# Patient Record
Sex: Female | Born: 1970 | Race: White | Hispanic: No | Marital: Married | Smoking: Never smoker
Health system: Southern US, Community
[De-identification: ages and names within clinical notes are randomized; demographics above are authoritative.]

## PROBLEM LIST (undated history)

## (undated) ENCOUNTER — Emergency Department (HOSPITAL_BASED_OUTPATIENT_CLINIC_OR_DEPARTMENT_OTHER): Admission: EM | Payer: 59 | Source: Home / Self Care

## (undated) DIAGNOSIS — E559 Vitamin D deficiency, unspecified: Secondary | ICD-10-CM

## (undated) DIAGNOSIS — R030 Elevated blood-pressure reading, without diagnosis of hypertension: Secondary | ICD-10-CM

## (undated) DIAGNOSIS — M2550 Pain in unspecified joint: Secondary | ICD-10-CM

## (undated) DIAGNOSIS — E079 Disorder of thyroid, unspecified: Secondary | ICD-10-CM

## (undated) DIAGNOSIS — E039 Hypothyroidism, unspecified: Secondary | ICD-10-CM

## (undated) DIAGNOSIS — M545 Low back pain, unspecified: Secondary | ICD-10-CM

## (undated) DIAGNOSIS — M2560 Stiffness of unspecified joint, not elsewhere classified: Secondary | ICD-10-CM

## (undated) DIAGNOSIS — D869 Sarcoidosis, unspecified: Secondary | ICD-10-CM

## (undated) DIAGNOSIS — J45990 Exercise induced bronchospasm: Secondary | ICD-10-CM

## (undated) DIAGNOSIS — F419 Anxiety disorder, unspecified: Secondary | ICD-10-CM

## (undated) DIAGNOSIS — Z9889 Other specified postprocedural states: Secondary | ICD-10-CM

## (undated) DIAGNOSIS — K829 Disease of gallbladder, unspecified: Secondary | ICD-10-CM

## (undated) DIAGNOSIS — K2270 Barrett's esophagus without dysplasia: Secondary | ICD-10-CM

## (undated) DIAGNOSIS — K429 Umbilical hernia without obstruction or gangrene: Secondary | ICD-10-CM

## (undated) DIAGNOSIS — R0600 Dyspnea, unspecified: Secondary | ICD-10-CM

## (undated) DIAGNOSIS — G43909 Migraine, unspecified, not intractable, without status migrainosus: Secondary | ICD-10-CM

## (undated) DIAGNOSIS — T4145XA Adverse effect of unspecified anesthetic, initial encounter: Secondary | ICD-10-CM

## (undated) DIAGNOSIS — R5383 Other fatigue: Secondary | ICD-10-CM

## (undated) DIAGNOSIS — N979 Female infertility, unspecified: Secondary | ICD-10-CM

## (undated) DIAGNOSIS — R12 Heartburn: Secondary | ICD-10-CM

## (undated) DIAGNOSIS — G4730 Sleep apnea, unspecified: Secondary | ICD-10-CM

## (undated) DIAGNOSIS — R600 Localized edema: Secondary | ICD-10-CM

## (undated) DIAGNOSIS — J45909 Unspecified asthma, uncomplicated: Secondary | ICD-10-CM

## (undated) DIAGNOSIS — I10 Essential (primary) hypertension: Secondary | ICD-10-CM

## (undated) DIAGNOSIS — G8929 Other chronic pain: Secondary | ICD-10-CM

## (undated) DIAGNOSIS — R112 Nausea with vomiting, unspecified: Secondary | ICD-10-CM

## (undated) DIAGNOSIS — C561 Malignant neoplasm of right ovary: Secondary | ICD-10-CM

## (undated) DIAGNOSIS — C562 Malignant neoplasm of left ovary: Principal | ICD-10-CM

## (undated) DIAGNOSIS — F32A Depression, unspecified: Secondary | ICD-10-CM

## (undated) DIAGNOSIS — M1990 Unspecified osteoarthritis, unspecified site: Secondary | ICD-10-CM

## (undated) DIAGNOSIS — K219 Gastro-esophageal reflux disease without esophagitis: Secondary | ICD-10-CM

## (undated) DIAGNOSIS — T451X5A Adverse effect of antineoplastic and immunosuppressive drugs, initial encounter: Secondary | ICD-10-CM

## (undated) DIAGNOSIS — N809 Endometriosis, unspecified: Secondary | ICD-10-CM

## (undated) DIAGNOSIS — G620 Drug-induced polyneuropathy: Secondary | ICD-10-CM

## (undated) DIAGNOSIS — R0602 Shortness of breath: Secondary | ICD-10-CM

## (undated) DIAGNOSIS — Z87442 Personal history of urinary calculi: Secondary | ICD-10-CM

## (undated) DIAGNOSIS — M549 Dorsalgia, unspecified: Secondary | ICD-10-CM

## (undated) HISTORY — DX: Sarcoidosis, unspecified: D86.9

## (undated) HISTORY — PX: PORTA CATH INSERTION: CATH118285

## (undated) HISTORY — PX: LIPOMA EXCISION: SHX5283

## (undated) HISTORY — DX: Elevated blood-pressure reading, without diagnosis of hypertension: R03.0

## (undated) HISTORY — DX: Dorsalgia, unspecified: M54.9

## (undated) HISTORY — DX: Adverse effect of antineoplastic and immunosuppressive drugs, initial encounter: G62.0

## (undated) HISTORY — DX: Vitamin D deficiency, unspecified: E55.9

## (undated) HISTORY — DX: Essential (primary) hypertension: I10

## (undated) HISTORY — DX: Umbilical hernia without obstruction or gangrene: K42.9

## (undated) HISTORY — DX: Localized edema: R60.0

## (undated) HISTORY — DX: Female infertility, unspecified: N97.9

## (undated) HISTORY — PX: GANGLION CYST EXCISION: SHX1691

## (undated) HISTORY — DX: Heartburn: R12

## (undated) HISTORY — DX: Stiffness of unspecified joint, not elsewhere classified: M25.60

## (undated) HISTORY — DX: Exercise induced bronchospasm: J45.990

## (undated) HISTORY — PX: LAPAROSCOPIC ENDOMETRIOSIS FULGURATION: SUR769

## (undated) HISTORY — DX: Shortness of breath: R06.02

## (undated) HISTORY — DX: Pain in unspecified joint: M25.50

## (undated) HISTORY — DX: Other fatigue: R53.83

## (undated) HISTORY — DX: Barrett's esophagus without dysplasia: K22.70

## (undated) HISTORY — DX: Other chronic pain: G89.29

## (undated) HISTORY — PX: MENISCUS REPAIR: SHX5179

## (undated) HISTORY — DX: Low back pain, unspecified: M54.50

## (undated) HISTORY — DX: Malignant neoplasm of right ovary: C56.1

## (undated) HISTORY — DX: Disease of gallbladder, unspecified: K82.9

## (undated) HISTORY — DX: Unspecified asthma, uncomplicated: J45.909

## (undated) HISTORY — DX: Adverse effect of antineoplastic and immunosuppressive drugs, initial encounter: T45.1X5A

## (undated) HISTORY — PX: OTHER SURGICAL HISTORY: SHX169

## (undated) HISTORY — DX: Malignant neoplasm of left ovary: C56.2

---

## 2004-05-21 ENCOUNTER — Ambulatory Visit: Payer: Self-pay | Admitting: Internal Medicine

## 2004-10-15 ENCOUNTER — Ambulatory Visit: Payer: Self-pay | Admitting: Internal Medicine

## 2004-11-19 ENCOUNTER — Ambulatory Visit: Payer: Self-pay | Admitting: Endocrinology

## 2004-12-01 ENCOUNTER — Encounter (INDEPENDENT_AMBULATORY_CARE_PROVIDER_SITE_OTHER): Payer: Self-pay | Admitting: *Deleted

## 2004-12-01 ENCOUNTER — Ambulatory Visit (HOSPITAL_COMMUNITY): Admission: RE | Admit: 2004-12-01 | Discharge: 2004-12-01 | Payer: Self-pay | Admitting: Orthopedic Surgery

## 2004-12-01 ENCOUNTER — Ambulatory Visit (HOSPITAL_BASED_OUTPATIENT_CLINIC_OR_DEPARTMENT_OTHER): Admission: RE | Admit: 2004-12-01 | Discharge: 2004-12-01 | Payer: Self-pay | Admitting: Orthopedic Surgery

## 2004-12-20 ENCOUNTER — Ambulatory Visit (HOSPITAL_COMMUNITY): Admission: RE | Admit: 2004-12-20 | Discharge: 2004-12-20 | Payer: Self-pay | Admitting: Obstetrics and Gynecology

## 2005-01-06 ENCOUNTER — Ambulatory Visit: Payer: Self-pay | Admitting: Internal Medicine

## 2005-01-28 ENCOUNTER — Emergency Department (HOSPITAL_COMMUNITY): Admission: EM | Admit: 2005-01-28 | Discharge: 2005-01-29 | Payer: Self-pay | Admitting: Emergency Medicine

## 2005-02-04 ENCOUNTER — Ambulatory Visit: Payer: Self-pay | Admitting: Internal Medicine

## 2005-06-08 ENCOUNTER — Ambulatory Visit: Payer: Self-pay | Admitting: Internal Medicine

## 2005-06-29 ENCOUNTER — Encounter (INDEPENDENT_AMBULATORY_CARE_PROVIDER_SITE_OTHER): Payer: Self-pay | Admitting: Specialist

## 2005-06-29 ENCOUNTER — Ambulatory Visit (HOSPITAL_COMMUNITY): Admission: RE | Admit: 2005-06-29 | Discharge: 2005-06-29 | Payer: Self-pay | Admitting: Obstetrics and Gynecology

## 2005-09-17 ENCOUNTER — Emergency Department (HOSPITAL_COMMUNITY): Admission: EM | Admit: 2005-09-17 | Discharge: 2005-09-17 | Payer: Self-pay | Admitting: Family Medicine

## 2005-12-03 ENCOUNTER — Inpatient Hospital Stay (HOSPITAL_COMMUNITY): Admission: AD | Admit: 2005-12-03 | Discharge: 2005-12-03 | Payer: Self-pay | Admitting: *Deleted

## 2006-02-09 ENCOUNTER — Ambulatory Visit: Payer: Self-pay | Admitting: Endocrinology

## 2006-03-08 ENCOUNTER — Ambulatory Visit: Payer: Self-pay | Admitting: Internal Medicine

## 2006-08-08 ENCOUNTER — Encounter: Admission: RE | Admit: 2006-08-08 | Discharge: 2006-11-06 | Payer: Self-pay | Admitting: Internal Medicine

## 2007-01-29 ENCOUNTER — Encounter: Payer: Self-pay | Admitting: *Deleted

## 2007-01-29 DIAGNOSIS — R12 Heartburn: Secondary | ICD-10-CM | POA: Insufficient documentation

## 2007-01-29 DIAGNOSIS — G43909 Migraine, unspecified, not intractable, without status migrainosus: Secondary | ICD-10-CM | POA: Insufficient documentation

## 2007-04-13 ENCOUNTER — Ambulatory Visit: Payer: Self-pay | Admitting: Internal Medicine

## 2007-04-13 DIAGNOSIS — N809 Endometriosis, unspecified: Secondary | ICD-10-CM | POA: Insufficient documentation

## 2007-04-13 DIAGNOSIS — F411 Generalized anxiety disorder: Secondary | ICD-10-CM | POA: Insufficient documentation

## 2007-04-13 DIAGNOSIS — J309 Allergic rhinitis, unspecified: Secondary | ICD-10-CM | POA: Insufficient documentation

## 2007-04-13 DIAGNOSIS — H6690 Otitis media, unspecified, unspecified ear: Secondary | ICD-10-CM | POA: Insufficient documentation

## 2007-04-13 DIAGNOSIS — K219 Gastro-esophageal reflux disease without esophagitis: Secondary | ICD-10-CM | POA: Insufficient documentation

## 2007-04-17 ENCOUNTER — Encounter (INDEPENDENT_AMBULATORY_CARE_PROVIDER_SITE_OTHER): Payer: Self-pay | Admitting: Internal Medicine

## 2007-05-17 ENCOUNTER — Telehealth (INDEPENDENT_AMBULATORY_CARE_PROVIDER_SITE_OTHER): Payer: Self-pay | Admitting: *Deleted

## 2007-07-16 ENCOUNTER — Ambulatory Visit: Payer: Self-pay | Admitting: Internal Medicine

## 2007-08-13 ENCOUNTER — Ambulatory Visit: Payer: Self-pay | Admitting: Internal Medicine

## 2007-08-13 DIAGNOSIS — S139XXA Sprain of joints and ligaments of unspecified parts of neck, initial encounter: Secondary | ICD-10-CM | POA: Insufficient documentation

## 2007-09-03 ENCOUNTER — Encounter (INDEPENDENT_AMBULATORY_CARE_PROVIDER_SITE_OTHER): Payer: Self-pay | Admitting: Internal Medicine

## 2007-09-06 ENCOUNTER — Encounter (INDEPENDENT_AMBULATORY_CARE_PROVIDER_SITE_OTHER): Payer: Self-pay | Admitting: Internal Medicine

## 2007-09-24 ENCOUNTER — Telehealth (INDEPENDENT_AMBULATORY_CARE_PROVIDER_SITE_OTHER): Payer: Self-pay | Admitting: *Deleted

## 2007-11-29 ENCOUNTER — Ambulatory Visit: Payer: Self-pay | Admitting: Internal Medicine

## 2007-11-29 ENCOUNTER — Telehealth (INDEPENDENT_AMBULATORY_CARE_PROVIDER_SITE_OTHER): Payer: Self-pay | Admitting: *Deleted

## 2007-11-29 DIAGNOSIS — F329 Major depressive disorder, single episode, unspecified: Secondary | ICD-10-CM

## 2007-11-29 DIAGNOSIS — G4700 Insomnia, unspecified: Secondary | ICD-10-CM | POA: Insufficient documentation

## 2007-11-29 DIAGNOSIS — F32A Depression, unspecified: Secondary | ICD-10-CM | POA: Insufficient documentation

## 2007-11-29 DIAGNOSIS — R21 Rash and other nonspecific skin eruption: Secondary | ICD-10-CM | POA: Insufficient documentation

## 2007-12-27 ENCOUNTER — Ambulatory Visit: Payer: Self-pay | Admitting: Internal Medicine

## 2008-01-03 ENCOUNTER — Encounter: Payer: Self-pay | Admitting: Internal Medicine

## 2008-01-05 ENCOUNTER — Encounter: Payer: Self-pay | Admitting: Internal Medicine

## 2008-11-02 ENCOUNTER — Inpatient Hospital Stay (HOSPITAL_COMMUNITY): Admission: AD | Admit: 2008-11-02 | Discharge: 2008-11-04 | Payer: Self-pay | Admitting: Obstetrics & Gynecology

## 2008-11-02 ENCOUNTER — Encounter (INDEPENDENT_AMBULATORY_CARE_PROVIDER_SITE_OTHER): Payer: Self-pay | Admitting: Obstetrics & Gynecology

## 2008-11-02 ENCOUNTER — Encounter: Payer: Self-pay | Admitting: Emergency Medicine

## 2008-11-11 ENCOUNTER — Ambulatory Visit: Payer: Self-pay | Admitting: Internal Medicine

## 2008-11-11 ENCOUNTER — Encounter: Payer: Self-pay | Admitting: Internal Medicine

## 2008-11-11 DIAGNOSIS — J9801 Acute bronchospasm: Secondary | ICD-10-CM | POA: Insufficient documentation

## 2008-12-17 ENCOUNTER — Ambulatory Visit: Payer: Self-pay | Admitting: Internal Medicine

## 2008-12-17 DIAGNOSIS — M549 Dorsalgia, unspecified: Secondary | ICD-10-CM | POA: Insufficient documentation

## 2008-12-17 DIAGNOSIS — L600 Ingrowing nail: Secondary | ICD-10-CM | POA: Insufficient documentation

## 2008-12-17 DIAGNOSIS — R1011 Right upper quadrant pain: Secondary | ICD-10-CM | POA: Insufficient documentation

## 2008-12-24 ENCOUNTER — Telehealth: Payer: Self-pay | Admitting: Internal Medicine

## 2009-01-14 ENCOUNTER — Telehealth: Payer: Self-pay | Admitting: Internal Medicine

## 2009-01-14 ENCOUNTER — Ambulatory Visit (HOSPITAL_COMMUNITY): Admission: RE | Admit: 2009-01-14 | Discharge: 2009-01-14 | Payer: Self-pay | Admitting: Gynecology

## 2009-02-11 ENCOUNTER — Ambulatory Visit: Payer: Self-pay | Admitting: Internal Medicine

## 2009-02-11 ENCOUNTER — Encounter: Payer: Self-pay | Admitting: Family Medicine

## 2009-02-11 DIAGNOSIS — R609 Edema, unspecified: Secondary | ICD-10-CM | POA: Insufficient documentation

## 2009-03-17 ENCOUNTER — Ambulatory Visit: Payer: Self-pay | Admitting: Internal Medicine

## 2009-04-06 ENCOUNTER — Telehealth: Payer: Self-pay | Admitting: Internal Medicine

## 2009-04-06 ENCOUNTER — Emergency Department (HOSPITAL_COMMUNITY): Admission: EM | Admit: 2009-04-06 | Discharge: 2009-04-06 | Payer: Self-pay | Admitting: Emergency Medicine

## 2009-06-15 ENCOUNTER — Ambulatory Visit: Payer: Self-pay | Admitting: Internal Medicine

## 2009-07-01 ENCOUNTER — Ambulatory Visit: Payer: Self-pay | Admitting: Internal Medicine

## 2009-07-01 ENCOUNTER — Encounter (INDEPENDENT_AMBULATORY_CARE_PROVIDER_SITE_OTHER): Payer: Self-pay | Admitting: *Deleted

## 2009-07-01 DIAGNOSIS — J329 Chronic sinusitis, unspecified: Secondary | ICD-10-CM | POA: Insufficient documentation

## 2009-08-26 ENCOUNTER — Encounter (INDEPENDENT_AMBULATORY_CARE_PROVIDER_SITE_OTHER): Payer: Self-pay | Admitting: *Deleted

## 2009-08-26 ENCOUNTER — Ambulatory Visit: Payer: Self-pay | Admitting: Internal Medicine

## 2009-08-26 DIAGNOSIS — J019 Acute sinusitis, unspecified: Secondary | ICD-10-CM | POA: Insufficient documentation

## 2009-12-18 ENCOUNTER — Ambulatory Visit: Payer: Self-pay | Admitting: Internal Medicine

## 2009-12-18 DIAGNOSIS — M25569 Pain in unspecified knee: Secondary | ICD-10-CM | POA: Insufficient documentation

## 2009-12-18 DIAGNOSIS — H9209 Otalgia, unspecified ear: Secondary | ICD-10-CM | POA: Insufficient documentation

## 2009-12-21 ENCOUNTER — Telehealth: Payer: Self-pay | Admitting: Internal Medicine

## 2009-12-22 ENCOUNTER — Encounter (INDEPENDENT_AMBULATORY_CARE_PROVIDER_SITE_OTHER): Payer: Self-pay | Admitting: *Deleted

## 2009-12-29 ENCOUNTER — Encounter: Payer: Self-pay | Admitting: Internal Medicine

## 2009-12-29 ENCOUNTER — Ambulatory Visit (HOSPITAL_COMMUNITY): Admission: RE | Admit: 2009-12-29 | Discharge: 2009-12-29 | Payer: Self-pay | Admitting: Internal Medicine

## 2010-02-24 ENCOUNTER — Ambulatory Visit: Payer: Self-pay | Admitting: Internal Medicine

## 2010-03-10 ENCOUNTER — Telehealth (INDEPENDENT_AMBULATORY_CARE_PROVIDER_SITE_OTHER): Payer: Self-pay | Admitting: *Deleted

## 2010-03-16 ENCOUNTER — Encounter: Payer: Self-pay | Admitting: Internal Medicine

## 2010-06-10 NOTE — Letter (Signed)
Summary: Out of Work  [facility]  [address] [place], [place] [address]   Phone: [phone]  Fax: [phone]    February 24, 2010   Employee:  [name]    To Whom It May Concern:   For Medical reasons, please excuse the above named employee from work for the following dates:  Start:   for the 3 days missed between Feb 08, 2010 and Feb 24, 2010, as well as from Feb 24, 2010  End:   Mar 09, 2010 -   to return to work without restriction Mar 10, 2010  If you need additional information, please feel free to contact our office.         Sincerely,    Corwin Levins MD

## 2010-06-10 NOTE — Assessment & Plan Note (Signed)
Summary: MUSCLE IN BACK ARE TIGHT/ BACK PAIN/ DR Raphael Gibney PT/NWS  #   Vital Signs:  Patient profile:   40 year old female Height:      68 inches (172.72 cm) Weight:      261.0 pounds (118.64 kg) O2 Sat:      97 % on Room air Temp:     98.8 degrees F (37.11 degrees C) oral Pulse rate:   98 / minute BP sitting:   128 / 72  (left arm) Cuff size:   large  Vitals Entered By: Orlan Leavens (June 15, 2009 3:42 PM)  O2 Flow:  Room air CC: Ongoing (L) back pain. pt states it feel like it in a knott all the time Is Patient Diabetic? No Pain Assessment Patient in pain? yes     Location: (L) back Type: aching   Primary Care Provider:  Corwin Levins MD  CC:  Ongoing (L) back pain. pt states it feel like it in a knott all the time.  History of Present Illness: here today with complaint of  lower right siode back pain. onset of symptoms was >6 months ago. course has been gradual onset and now occurs in constantly present but waxing/waning pattern of intensity. problem precipitated by nothing in particular - no injury or trauma. symptom characterized as "knot" feeling to the right side of low back. symptom radiates to nowhere - not into left side nor buttock or legs. problem associated with discomfort but not associated with trouble walking, incont, rash or falls. symptoms improved by heating pad - also trying stretches per PT friend but not helping to resolve issue. symptoms worsened with activity at work - lifting, twisting. + prior hx of same symptoms - similar to myofacial pain in left shoulder blade area (but this only occassionaly present).   Current Medications (verified): 1)  Ambien Cr 12.5 Mg  Cr-Tabs (Zolpidem Tartrate) .Marland Kitchen.. 1 By Mouth At Bedtime Prn 2)  Proair Hfa 108 (90 Base) Mcg/act Aers (Albuterol Sulfate) .... 2 Puffs Qid As Needed 3)  Hydrochlorothiazide 25 Mg Tabs (Hydrochlorothiazide) .Marland Kitchen.. 1po Once Daily 4)  Sumatriptan Succinate 100 Mg Tabs (Sumatriptan Succinate)  .Marland Kitchen.. 1 By Mouth Every Other Day As Needed 5)  Apri 0.15-30 Mg-Mcg Tabs (Desogestrel-Ethinyl Estradiol) .... Take One Tab By Mouth Once Daily 6)  Alprazolam 0.5 Mg Tabs (Alprazolam) .... 1/2 - 1 By Mouth Two Times A Day As Needed  Allergies (verified): 1)  ! Sulfa 2)  ! * Avelox 3)  Doxycycline  Past History:  Past Medical History: Reviewed history from 11/29/2007 and no changes required. Allergic rhinitis Anxiety GERD arthritis migraines, hx endometriosis/?PCOS - infertile Depression  Review of Systems  The patient denies fever, chest pain, headaches, muscle weakness, and difficulty walking.    Physical Exam  General:  overweight-appearing.  alert, well-developed, well-nourished, and cooperative to examination.    Lungs:  normal respiratory effort, no intercostal retractions or use of accessory muscles; normal breath sounds bilaterally - no crackles and no wheezes.    Heart:  normal rate, regular rhythm, no murmur, and no rub. BLE with chronic pitting edema. Msk:  back: full range of motion of lumbar spine. Nontender to palpation over vertebra but +right paraspinal spasm.. Negative straight leg raise. Deep tendon reflexes symmetrically intact at Achilles and patella, negative clonus. Sensation intact throughout all dermatomes in bilateral lower extremities. Full strength to manual muscle testing in all major muscule groups. Able to heel and toe walk without difficulty and ambulates  with a normal gait.  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 1po three times a day as needed    Tramadol Hcl 50 Mg Tabs (Tramadol hcl) .Marland Kitchen... 1po q 6 hrs as needed pain Her updated medication list for this problem includes:    Robaxin-750 750 Mg Tabs (Methocarbamol) .Marland Kitchen... 1 by mouth every 8 hours as needed for muscle spasms  Orders: T-Lumbar Spine 2 Views (72100TC) Physical Therapy Referral (PT)  Complete Medication List: 1)  Ambien Cr 12.5 Mg Cr-tabs (Zolpidem tartrate) .Marland Kitchen.. 1 by mouth at bedtime prn 2)  Proair Hfa 108 (90 Base) Mcg/act Aers (Albuterol sulfate) .... 2 puffs qid as needed 3)  Hydrochlorothiazide 25 Mg Tabs (Hydrochlorothiazide) .Marland Kitchen.. 1po once daily 4)  Sumatriptan Succinate 100 Mg Tabs (Sumatriptan succinate) .Marland Kitchen.. 1 by mouth every other day as needed 5)  Apri 0.15-30 Mg-mcg Tabs (Desogestrel-ethinyl estradiol) .... Take one tab by mouth once daily 6)  Alprazolam 0.5 Mg Tabs (Alprazolam) .... 1/2 - 1 by mouth two times a day as needed 7)  Prednisone (pak) 10 Mg Tabs (Prednisone) .... As directed x 6 days 8)  Robaxin-750 750 Mg Tabs (Methocarbamol) .Marland Kitchen.. 1 by mouth every 8 hours as needed for muscle spasms  Patient Instructions: 1)  it was good to see you today.  2)  test(s) ordered today - your results will be posted on the phone tree for review in 48-72 hours from the time of test completion; call [phone] and enter your 9 digit MRN (listed above on this page, just below your name); if any changes need to be made or there are abnormal results, you will be contacted directly.  3)  treat the pain with pred pak and muscle relaxants (robaxin) - your prescriptions have been electronically submitted to your pharmacy. Please take as directed. Contact our office if you believe you're having problems with the medication(s).  4)  we'll make referral to physical therapy. Our office will contact you  regarding this appointment once made.  5)  it is important that you work on losing weight - monitor your diet and consume fewer calories such as less carbohydrates (sugar) and less fat. you also need to increase your physical activity level - start by walking for 10-20 minutes 3 times per week and work up to 30 minutes 4-5 times each week.  Prescriptions: ROBAXIN-750 750 MG TABS (METHOCARBAMOL) 1 by mouth every 8 hours as needed for muscle spasms  #40 x 1   Entered and Authorized by:   Newt Lukes MD   Signed by:   Newt Lukes MD on 06/15/2009   Method used:   Electronically to        [facility] [facility] (retail)       [address]       [place], [place]  [address]       Ph: [phone]       Fax: [phone]   RxID:   [id number] PREDNISONE (PAK) 10 MG TABS (PREDNISONE) as directed x 6 days  #1 x 0   Entered and Authorized by:   Newt Lukes MD   Signed by:   Newt Lukes MD on 06/15/2009   Method used:   Electronically to        [facility] [address] (retail)       [address]       [place], [place]  [address]       Ph: [phone]       Fax: [phone]   RxID:   [id number]

## 2010-06-10 NOTE — Progress Notes (Signed)
Summary: PA Cymbalta  Phone Note Call from Patient Call back at Home Phone [phone]   Caller: Patient Summary of Call: Pt called to inform office that PA is needed for Cymbalta at [facility] [phone] Initial call taken by: Margaret Pyle, CMA,  March 10, 2010 8:56 AM  Follow-up for Phone Call        Called [facility] @ [phone], awaiting form. Dagoberto Reef  March 12, 2010 3:36 PM Received form, gave to Dr Jonny Ruiz. Dagoberto Reef  March 12, 2010 4:20 PM  [place] faxed to [phone], awaiting approval. Dagoberto Reef  March 16, 2010 4:14 PM  Cymbalta approved 03/16/10-03/16/2011, pt aware. Follow-up by: Dagoberto Reef,  March 16, 2010 4:50 PM

## 2010-06-10 NOTE — Assessment & Plan Note (Signed)
Summary: DR Sammuel Cooper PT/NO SLOT-COUGH-CHEST CONGEST--STC   Vital Signs:  Patient profile:   40 year old female Height:      68 inches (172.72 cm) Weight:      261 pounds (118.64 kg) O2 Sat:      97 % on Room air Temp:     98.5 degrees F (36.94 degrees C) oral Pulse rate:   94 / minute BP sitting:   124 / 88  (left arm) Cuff size:   large  Vitals Entered By: Orlan Leavens (July 01, 2009 2:04 PM)  O2 Flow:  Room air CC: cough/ chest congestion. also pt req refill on xanax, URI symptoms Is Patient Diabetic? No Pain Assessment Patient in pain? no        Primary Care Provider:  Corwin Levins MD  CC:  cough/ chest congestion. also pt req refill on xanax and URI symptoms.  History of Present Illness:  URI Symptoms      This is a 40 year old woman who presents with URI symptoms.  The symptoms began 2 days ago.  The severity is described as mild.  The patient reports nasal congestion, green discharge from nose, dry cough, and sick contacts, but denies sore throat and productive cough.  Associated symptoms include dyspnea.  The patient denies fever.  The patient also reports headache and severe fatigue.    Current Medications (verified): 1)  Proair Hfa 108 (90 Base) Mcg/act Aers (Albuterol Sulfate) .... 2 Puffs Qid As Needed 2)  Hydrochlorothiazide 25 Mg Tabs (Hydrochlorothiazide) .Marland Kitchen.. 1po Once Daily 3)  Sumatriptan Succinate 100 Mg Tabs (Sumatriptan Succinate) .Marland Kitchen.. 1 By Mouth Every Other Day As Needed 4)  Apri 0.15-30 Mg-Mcg Tabs (Desogestrel-Ethinyl Estradiol) .... Take One Tab By Mouth Once Daily 5)  Alprazolam 0.5 Mg Tabs (Alprazolam) .... 1/2 - 1 By Mouth Two Times A Day As Needed 6)  Robaxin-750 750 Mg Tabs (Methocarbamol) .Marland Kitchen.. 1 By Mouth Every 8 Hours As Needed For Muscle Spasms  Allergies (verified): 1)  ! Sulfa 2)  ! * Avelox 3)  Doxycycline  Past History:  Past Medical History: Reviewed history from 11/29/2007 and no changes required. Allergic  rhinitis Anxiety GERD arthritis migraines, hx endometriosis/?PCOS - infertile Depression  Review of Systems  The patient denies syncope, hemoptysis, and abdominal pain.    Physical Exam  General:  overweight-appearing.  alert, well-developed, well-nourished, and cooperative to examination. mildly ill   Eyes:  vision grossly intact; pupils equal, round and reactive to light.  conjunctiva and lids normal.    Ears:  normal pinnae bilaterally, without erythema, swelling, or tenderness to palpation. TMs clear - R with mild erythema and fluid/air bubble - L without effusion, or cerumen impaction. Hearing grossly normal bilaterally  Mouth:  teeth and gums in good repair; mucous membranes moist, without lesions or ulcers. oropharynx clear without exudate, mild erythema. +PND - white and thick Lungs:  normal respiratory effort, no intercostal retractions or use of accessory muscles; normal breath sounds bilaterally - no crackles and no wheezes.    Heart:  normal rate, regular rhythm, no murmur, and no rub. BLE with chronic pitting edema.   Impression & Recommendations:  Problem # 1:  SINUSITIS (ICD-473.9)  Her updated medication list for this problem includes:    Amoxicillin-pot Clavulanate 875-125 Mg Tabs (Amoxicillin-pot clavulanate) .Marland Kitchen... 1 by mouth two times a day x 7days    Promethazine Vc/codeine 6.25-5-10 Mg/20ml Syrp (Phenyleph-promethazine-cod) .Marland KitchenMarland KitchenMarland KitchenMarland Kitchen 5 cc by mouth every 4 hours as needed for cough -  especially night symptoms  Take antibiotics for full duration. Discussed treatment options including indications for coronal CT scan of sinuses and ENT referral.   Problem # 2:  BACK PAIN (ICD-724.5) improved while taking pred pak earlier this month - robaxin ineffective - try flexeril -  ?need other back specialist eval -  encouraged to get through current illness and then followup with dr. Carvel Getting re: same if persists Her updated medication list for this problem includes:     Cyclobenzaprine Hcl 5 Mg Tabs (Cyclobenzaprine hcl) .Marland Kitchen... 1-2 by mouth every 8 hours orn for muscle spasms  Problem # 3:  ANXIETY (ICD-300.00) very limited supply on meds provided until can get in with PCP Her updated medication list for this problem includes:    Alprazolam 0.5 Mg Tabs (Alprazolam) .Marland Kitchen... 1/2 - 1 by mouth two times a day as needed  Complete Medication List: 1)  Proair Hfa 108 (90 Base) Mcg/act Aers (Albuterol sulfate) .... 2 puffs qid as needed 2)  Hydrochlorothiazide 25 Mg Tabs (Hydrochlorothiazide) .Marland Kitchen.. 1po once daily 3)  Sumatriptan Succinate 100 Mg Tabs (Sumatriptan succinate) .Marland Kitchen.. 1 by mouth every other day as needed 4)  Apri 0.15-30 Mg-mcg Tabs (Desogestrel-ethinyl estradiol) .... Take one tab by mouth once daily 5)  Alprazolam 0.5 Mg Tabs (Alprazolam) .... 1/2 - 1 by mouth two times a day as needed 6)  Cyclobenzaprine Hcl 5 Mg Tabs (Cyclobenzaprine hcl) .Marland Kitchen.. 1-2 by mouth every 8 hours orn for muscle spasms 7)  Amoxicillin-pot Clavulanate 875-125 Mg Tabs (Amoxicillin-pot clavulanate) .Marland Kitchen.. 1 by mouth two times a day x 7days 8)  Promethazine Vc/codeine 6.25-5-10 Mg/16ml Syrp (Phenyleph-promethazine-cod) .... 5 cc by mouth every 4 hours as needed for cough - especially night symptoms  Patient Instructions: 1)  it was good to see you today. 2)  antibiotics -Augmentin - and codiene cough syrup for your sinus symptoms  -  Please take as directed. Contact our office if you believe you're having problems with the medication(s).  3)  change muscle relaxant from robaxin to generic flexeril - use as directed 4)  limited supply refill on xanax until you see dr. Jonny Ruiz 5)  Get plenty of rest, drink lots of clear liquids, and use Tylenol or Ibuprofen for fever and comfort. Return in 7-10 days if you're not better:sooner if you're feeling worse. 6)  Please schedule a follow-up appointment with Dr. Jonny Ruiz in next 2-3 months, sooner if problems.  Prescriptions: ALPRAZOLAM 0.5 MG TABS  (ALPRAZOLAM) 1/2 - 1 by mouth two times a day as needed  #20 x 0   Entered and Authorized by:   Newt Lukes MD   Signed by:   Newt Lukes MD on 07/01/2009   Method used:   Print then Give to Patient   RxID:   [id number] CYCLOBENZAPRINE HCL 5 MG TABS (CYCLOBENZAPRINE HCL) 1-2 by mouth every 8 hours orn for muscle spasms  #30 x 0   Entered and Authorized by:   Newt Lukes MD   Signed by:   Newt Lukes MD on 07/01/2009   Method used:   Print then Give to Patient   RxID:   [id number] PROMETHAZINE VC/CODEINE 6.25-5-10 MG/5ML SYRP (PHENYLEPH-PROMETHAZINE-COD) 5 cc by mouth every 4 hours as needed for cough - especially night symptoms  #120cc x 0   Entered and Authorized by:   Newt Lukes MD   Signed by:   Newt Lukes MD on 07/01/2009   Method used:   Print then Give  to Patient   RxID:   [id number] AMOXICILLIN-POT CLAVULANATE 875-125 MG TABS (AMOXICILLIN-POT CLAVULANATE) 1 by mouth two times a day x 7days  #14 x 0   Entered and Authorized by:   Newt Lukes MD   Signed by:   Newt Lukes MD on 07/01/2009   Method used:   Print then Give to Patient   RxID:   [id number]

## 2010-06-10 NOTE — Letter (Signed)
Summary: Out of Work  [facility]  [address] [place], [place] [address]   Phone: [phone]  Fax: [phone]    August 26, 2009   Employee:  [name]    To Whom It May Concern:   For Medical reasons, please excuse the above named employee from work for the following dates:  Start:   08/26/2009  End:   08/26/2009  If you need additional information, please feel free to contact our office.         Sincerely,    Dr. Oliver Barre

## 2010-06-10 NOTE — Consult Note (Signed)
Summary: [facility]  [facility]   Imported By: Sherian Rein 01/04/2010 09:43:09  _____________________________________________________________________  External Attachment:    Type:   Image     Comment:   External Document

## 2010-06-10 NOTE — Medication Information (Signed)
Summary: Cymbalta / [facility]  Cymbalta / [facility]   Imported By: Lennie Odor 03/18/2010 11:27:03  _____________________________________________________________________  External Attachment:    Type:   Image     Comment:   External Document

## 2010-06-10 NOTE — Letter (Signed)
Summary: Out of Work  [facility]  [address] [place], [place] [address]   Phone: [phone]  Fax: [phone]    July 01, 2009   Employee:  Kacie HARVEY    To Whom It May Concern:   For Medical reasons, please excuse the above named employee from work for the following dates:  Start: 07/01/09    End: 07/02/09, May return to work on Friday 07/03/09    If you need additional information, please feel free to contact our office.         Sincerely,    Dr. Rene Paci

## 2010-06-10 NOTE — Progress Notes (Signed)
Summary: WORK NOTE  Phone Note Call from Patient Call back at Home Phone [phone]   Caller: PATIENT - WALK IN SHEET Summary of Call: [name] HAS THE LETTER EXCUSING HER FROM [employer].  [employer] WON'T ACCEPT A "LIGHT DUTY" NOTE.  SHE WANTS ONE STATING SHE CAN EITHER WORK OR STAY OUT.  SHE IS STILL WAITING ON AN APPT FOR THE MRI. NUMBER TO [phone] Initial call taken by: Hilarie Fredrickson,  December 21, 2009 1:26 PM  Follow-up for Phone Call        need dates for MRI and ortho from [facility] please  Follow-up by: Corwin Levins MD,  December 21, 2009 3:18 PM  Additional Follow-up for Phone Call Additional follow up Details #1::        Dr Jonny Ruiz pt is scheduled for her  for MRI  [facility] Aug 18.2011@5 :00 pm- as for orthopedic she owe a bill no one will see her until her balance is cleared up with [facility] her ENT APPT IS 12-24-2009@1 :30pm dr Clovis Cao  left pt a msg to call reguarding her referrals Additional Follow-up by: Shelbie Proctor,  December 21, 2009 3:54 PM    Additional Follow-up for Phone Call Additional follow up Details #2::    ok for work note per robin from last day pt worked , to (for now) aug 22  please ask pt if she is willing to see ortho at [facility] [place] Follow-up by: Corwin Levins MD,  December 21, 2009 4:56 PM  Additional Follow-up for Phone Call Additional follow up Details #3:: Details for Additional Follow-up Action Taken: called pt left msg. to call back  PT CALLED.  SHE WORKED 1/2 DAY SAT. AUG 13 AND FULL DAY SUN. AUG 14.  SHE HAS BEEN OUT THIS WEEK.   SHE WILL GO TO [facility] FOR AN APPT./NWS  12-24-09  Called pt to inform to pickup letter at front desk. Additional Follow-up by: Robin Ewing CMA Duncan Dull),  December 22, 2009 7:57 AM   referral done to ortho - [facility]  Corwin Levins MD  December 22, 2009 12:23 PM

## 2010-06-10 NOTE — Assessment & Plan Note (Signed)
Summary: anxiety/cd   Vital Signs:  Patient profile:   40 year old female Height:      68 inches Weight:      255.25 pounds BMI:     38.95 O2 Sat:      98 % on Room air Temp:     99.3 degrees F oral Pulse rate:   87 / minute BP sitting:   110 / 76  (left arm) Cuff size:   large  Vitals Entered By: Zella Ball Ewing CMA Duncan Dull) (February 24, 2010 3:47 PM)  O2 Flow:  Room air CC: Anxiety and depression/RE   Primary Care Aricela Bertagnolli:  Corwin Levins MD  CC:  Anxiety and depression/RE.  History of Present Illness: here with acute  c/o marked stressors and anxiety - has had numberous significant stressors - 4 dogs got out and died, 2 firends coincidently died recnetly (suicide both),  husband just lost his job, and electricity cut off and cant afford to re-start until next wk; denies suicidal ideation;  did see urgent care last night with sinus congesiton  - tx with steroid shot that ? makes her more irritable here;  requests antidepressant  and xanax - did well with cymbalta previously ; cant stop crying; husband is good support; mother died 09-21-2007, and aunt died last yr (mother was my pt as well);  just cant seem to recover from one shock to the next;  she is still working, but had to call in sick today - jsut couldnt face it. Missed 3 days last wk as well.  Had the last 2 days, jut couldnt go in today. No illicit drug use, or ETOH  .  Migraines have been stable overall without worsening freq,severity in the past few wks - only 1-2 overall.  Also requests flexeril for recurrnet right lower back spasms likewise without change, bowel or bladder, gait problem or fall, fever or wt loss  Problems Prior to Update: 1)  Ear Pain, Bilateral  (ICD-388.70) 2)  Knee Pain, Right  (ICD-719.46) 3)  Edema  (ICD-782.3) 4)  Sinusitis- Acute-nos  (ICD-461.9) 5)  Sinusitis  (ICD-473.9) 6)  Peripheral Edema  (ICD-782.3) 7)  Back Pain  (ICD-724.5) 8)  Ingrown Toenail  (ICD-703.0) 9)  Ruq Pain  (ICD-789.01) 10)  Acute  Bronchospasm  (ICD-519.11) 11)  Obesity, Morbid  (ICD-278.01) 12)  Otitis Media, Acute, Right  (ICD-382.9) 13)  Depression  (ICD-311) 14)  Insomnia-sleep Disorder-unspec  (ICD-780.52) 15)  Rash-nonvesicular  (ICD-782.1) 16)  Cervical Strain  (ICD-847.0) 17)  Otitis Media, Acute  (ICD-382.9) 18)  Endometriosis  (ICD-617.9) 19)  Gerd  (ICD-530.81) 20)  Anxiety  (ICD-300.00) 21)  Allergic Rhinitis  (ICD-477.9) 22)  Migraine Headache  (ICD-346.90) 23)  Heartburn  (ICD-787.1)  Medications Prior to Update: 1)  Proair Hfa 108 (90 Base) Mcg/act Aers (Albuterol Sulfate) .... 2 Puffs Qid As Needed 2)  Furosemide 40 Mg Tabs (Furosemide) .Marland Kitchen.. 1po Once Daily 3)  Axert 12.5 Mg Tabs (Almotriptan Malate) .Marland Kitchen.. 1 By Mouth Every Other Day As Needed As Needed 4)  Apri 0.15-30 Mg-Mcg Tabs (Desogestrel-Ethinyl Estradiol) .... Take One Tab By Mouth Once Daily 5)  Alprazolam 1 Mg Tabs (Alprazolam) .Marland Kitchen.. 1 By Mouth Two Times A Day As Needed 6)  Fluticasone Propionate 50 Mcg/act Susp (Fluticasone Propionate) .... 2 Spray/side Once Daily 7)  Meclizine Hcl 12.5 Mg Tabs (Meclizine Hcl) .Marland Kitchen.. 1 By Mouth Q 6 Hrs Prn Dizziness 8)  Flexeril 5 Mg Tabs (Cyclobenzaprine Hcl) .Marland Kitchen.. 1 By Mouth Three Times  A Day As Needed 9)  Percocet 5-325 Mg Tabs (Oxycodone-Acetaminophen) .... By Mouth Q 6 Hrs As Needed 10)  Prednisone 10 Mg Tabs (Prednisone) .... Asd  Current Medications (verified): 1)  Proair Hfa 108 (90 Base) Mcg/act Aers (Albuterol Sulfate) .... 2 Puffs Qid As Needed 2)  Furosemide 40 Mg Tabs (Furosemide) .Marland Kitchen.. 1po Once Daily 3)  Axert 12.5 Mg Tabs (Almotriptan Malate) .Marland Kitchen.. 1 By Mouth Every Other Day As Needed As Needed 4)  Apri 0.15-30 Mg-Mcg Tabs (Desogestrel-Ethinyl Estradiol) .... Take One Tab By Mouth Once Daily 5)  Alprazolam 1 Mg Tabs (Alprazolam) .Marland Kitchen.. 1 By Mouth Three Times A Day As Needed 6)  Fluticasone Propionate 50 Mcg/act Susp (Fluticasone Propionate) .... 2 Spray/side Once Daily 7)  Meclizine Hcl 12.5 Mg  Tabs (Meclizine Hcl) .Marland Kitchen.. 1 By Mouth Q 6 Hrs Prn Dizziness 8)  Flexeril 5 Mg Tabs (Cyclobenzaprine Hcl) .Marland Kitchen.. 1 By Mouth Three Times A Day As Needed 9)  Percocet 5-325 Mg Tabs (Oxycodone-Acetaminophen) .... By Mouth Q 6 Hrs As Needed 10)  Prednisone 10 Mg Tabs (Prednisone) .... Asd 11)  Cymbalta 60 Mg Cpep (Duloxetine Hcl) .Marland Kitchen.. 1po Once Daily  Allergies (verified): 1)  ! Sulfa 2)  ! * Avelox 3)  Doxycycline 4)  * Lexapro  Past History:  Past Medical History: Last updated: 11/29/2007 Allergic rhinitis Anxiety GERD arthritis migraines, hx endometriosis/?PCOS - infertile Depression  Past Surgical History: Last updated: 04/13/2007 ear tubes in childhood ganglion cyst in right wrist endometriosis  Social History: Last updated: 11/29/2007 Married lives with spouse Current Smoker-recently started Alcohol use-yes-0-2, less than monthly Drug use-no no children work - [employer] administrator  Risk Factors: Smoking Status: current (04/13/2007)  Review of Systems       all otherwise negative per pt -    Physical Exam  General:  alert and overweight-appearing.   Head:  normocephalic and atraumatic.   Eyes:  vision grossly intact, pupils equal, and pupils round.   Ears:  L ear normal.  with right TM moderate erythema and bulging Nose:  no external deformity and no nasal discharge.   Mouth:  pharyngeal erythema and fair dentition.   Neck:  supple and no masses.   Lungs:  normal respiratory effort and normal breath sounds.   Heart:  normal rate and regular rhythm.   Extremities:  no edema, no erythema  Neurologic:  strength normal in all extremities and gait normal.   Psych:  not agitated, not suicidal, depressed affect, tearful, and severely anxious.     Impression & Recommendations:  Problem # 1:  DEPRESSION (ICD-311)  Her updated medication list for this problem includes:    Alprazolam 1 Mg Tabs (Alprazolam) .Marland Kitchen... 1 by mouth three times a day as needed    Cymbalta  60 Mg Cpep (Duloxetine hcl) .Marland Kitchen... 1po once daily ok to re-start the cymbalta 30 for 1 wk, then 60 after, refer psychiatry - major depression  Orders: Psychiatric Referral (Psych)  Problem # 2:  ANXIETY (ICD-300.00)  Her updated medication list for this problem includes:    Alprazolam 1 Mg Tabs (Alprazolam) .Marland Kitchen... 1 by mouth three times a day as needed    Cymbalta 60 Mg Cpep (Duloxetine hcl) .Marland Kitchen... 1po once daily to incr to three times a day for now  Problem # 3:  ALLERGIC RHINITIS (ICD-477.9)  Her updated medication list for this problem includes:    Fluticasone Propionate 50 Mcg/act Susp (Fluticasone propionate) .Marland Kitchen... 2 spray/side once daily improved s/p steroid shot yesterday -  Continue all previous medications as before this visit   Problem # 4:  MIGRAINE HEADACHE (ICD-346.90)  Her updated medication list for this problem includes:    Axert 12.5 Mg Tabs (Almotriptan malate) .Marland Kitchen... 1 by mouth every other day as needed as needed    Percocet 5-325 Mg Tabs (Oxycodone-acetaminophen) ..... By mouth q 6 hrs as needed stable overall by hx and exam, ok to continue meds/tx as is   Complete Medication List: 1)  Proair Hfa 108 (90 Base) Mcg/act Aers (Albuterol sulfate) .... 2 puffs qid as needed 2)  Furosemide 40 Mg Tabs (Furosemide) .Marland Kitchen.. 1po once daily 3)  Axert 12.5 Mg Tabs (Almotriptan malate) .Marland Kitchen.. 1 by mouth every other day as needed as needed 4)  Apri 0.15-30 Mg-mcg Tabs (Desogestrel-ethinyl estradiol) .... Take one tab by mouth once daily 5)  Alprazolam 1 Mg Tabs (Alprazolam) .Marland Kitchen.. 1 by mouth three times a day as needed 6)  Fluticasone Propionate 50 Mcg/act Susp (Fluticasone propionate) .... 2 spray/side once daily 7)  Meclizine Hcl 12.5 Mg Tabs (Meclizine hcl) .Marland Kitchen.. 1 by mouth q 6 hrs prn dizziness 8)  Flexeril 5 Mg Tabs (Cyclobenzaprine hcl) .Marland Kitchen.. 1 by mouth three times a day as needed 9)  Percocet 5-325 Mg Tabs (Oxycodone-acetaminophen) .... By mouth q 6 hrs as needed 10)  Prednisone 10  Mg Tabs (Prednisone) .... Asd 11)  Cymbalta 60 Mg Cpep (Duloxetine hcl) .Marland Kitchen.. 1po once daily  Patient Instructions: 1)  start the cymbalta 30 mg per day for one wk, then 60 mg per day after that 2)  increase the alprazolam up to 3 times per day as needed  3)  Continue all previous medications as before this visit  4)  You will be contacted about the referral(s) to: psychiatry 5)  Please schedule a follow-up appointment as needed. Prescriptions: CYMBALTA 60 MG CPEP (DULOXETINE HCL) 1po once daily  #90 x 3   Entered and Authorized by:   Corwin Levins MD   Signed by:   Corwin Levins MD on 02/24/2010   Method used:   Print then Give to Patient   RxID:   [id number] ALPRAZOLAM 1 MG TABS (ALPRAZOLAM) 1 by mouth three times a day as needed  #90 x 1   Entered and Authorized by:   Corwin Levins MD   Signed by:   Corwin Levins MD on 02/24/2010   Method used:   Print then Give to Patient   RxID:   [id number]    Orders Added: 1)  Psychiatric Referral [Psych] 2)  Est. Patient Level IV [29518]

## 2010-06-10 NOTE — Assessment & Plan Note (Signed)
Summary: STIFF KNEE PAIN---STC   Vital Signs:  Patient profile:   40 year old female Height:      67 inches Weight:      260.50 pounds BMI:     40.95 O2 Sat:      97 % on Room air Temp:     98.1 degrees F oral Pulse rate:   101 / minute BP sitting:   120 / 84  (left arm) Cuff size:   large  Vitals Entered By: Zella Ball Ewing CMA Duncan Dull) (December 18, 2009 1:44 PM)  O2 Flow:  Room air CC: Right Knee pain, low back spasms/RE   Primary Care Provider:  Corwin Levins MD  CC:  Right Knee pain and low back spasms/RE.  History of Present Illness: here to f/u;  saw urgent care on [place] for right knee pain, incidently had right lower back pain and concluded per pt she had sciatica, given limited rx percocet and prednisone.  Here today for second opinion, still with primary problem of right knee pain, moderate overall with severe pain flares, now assoc with incresaed swelling , most pain to area just below the patella anteriorly, feels somewhat unstable as if she might fall and with click to ambulate (no locking);  no falls but she is favoring the RLE , forcing her to walk differently and also with right lower back pain and tenderness that has occurred before;  back pain somewhat radiates to the right buttock, but not the leg and leg without other radicular pain, weak or numbness.  No bowel or bladder change.  No fever, wt loss, night sweats, loss of appetite or other constitutional symptoms  Also incidently with acute on chronic right earache with low grade tamp x 3 days without headache, ST , chills and Pt denies CP, sob, doe, wheezing, orthopnea, pnd, worsening LE edema, palps, dizziness or syncope  Does mention the imitrex causes choking anterior neck sensation and reqwuests change of med, no increased freq or severity and Pt denies new neuro symptoms such as  facial or extremity weakness   Problems Prior to Update: 1)  Ear Pain, Bilateral  (ICD-388.70) 2)  Knee Pain, Right  (ICD-719.46) 3)  Edema   (ICD-782.3) 4)  Sinusitis- Acute-nos  (ICD-461.9) 5)  Sinusitis  (ICD-473.9) 6)  Peripheral Edema  (ICD-782.3) 7)  Back Pain  (ICD-724.5) 8)  Ingrown Toenail  (ICD-703.0) 9)  Ruq Pain  (ICD-789.01) 10)  Acute Bronchospasm  (ICD-519.11) 11)  Obesity, Morbid  (ICD-278.01) 12)  Otitis Media, Acute, Right  (ICD-382.9) 13)  Depression  (ICD-311) 14)  Insomnia-sleep Disorder-unspec  (ICD-780.52) 15)  Rash-nonvesicular  (ICD-782.1) 16)  Cervical Strain  (ICD-847.0) 17)  Otitis Media, Acute  (ICD-382.9) 18)  Endometriosis  (ICD-617.9) 19)  Gerd  (ICD-530.81) 20)  Anxiety  (ICD-300.00) 21)  Allergic Rhinitis  (ICD-477.9) 22)  Migraine Headache  (ICD-346.90) 23)  Heartburn  (ICD-787.1)  Medications Prior to Update: 1)  Proair Hfa 108 (90 Base) Mcg/act Aers (Albuterol Sulfate) .... 2 Puffs Qid As Needed 2)  Furosemide 40 Mg Tabs (Furosemide) .Marland Kitchen.. 1po Once Daily 3)  Sumatriptan Succinate 100 Mg Tabs (Sumatriptan Succinate) .Marland Kitchen.. 1 By Mouth Every Other Day As Needed 4)  Apri 0.15-30 Mg-Mcg Tabs (Desogestrel-Ethinyl Estradiol) .... Take One Tab By Mouth Once Daily 5)  Alprazolam 1 Mg Tabs (Alprazolam) .Marland Kitchen.. 1 By Mouth Two Times A Day As Needed 6)  Cyclobenzaprine Hcl 5 Mg Tabs (Cyclobenzaprine Hcl) .Marland Kitchen.. 1-2 By Mouth Every 8 Hours Orn For Muscle  Spasms 7)  Promethazine Vc/codeine 6.25-5-10 Mg/67ml Syrp (Phenyleph-Promethazine-Cod) .... 5 Cc By Mouth Every 4 Hours As Needed For Cough - Especially Night Symptoms 8)  Azithromycin 250 Mg Tabs (Azithromycin) .... 2po Qd For 1 Day, Then 1po Qd For 4days, Then Stop 9)  Fluticasone Propionate 50 Mcg/act Susp (Fluticasone Propionate) .... 2 Spray/side Once Daily 10)  Meclizine Hcl 12.5 Mg Tabs (Meclizine Hcl) .Marland Kitchen.. 1 By Mouth Q 6 Hrs Prn Dizziness  Current Medications (verified): 1)  Proair Hfa 108 (90 Base) Mcg/act Aers (Albuterol Sulfate) .... 2 Puffs Qid As Needed 2)  Furosemide 40 Mg Tabs (Furosemide) .Marland Kitchen.. 1po Once Daily 3)  Axert 12.5 Mg Tabs  (Almotriptan Malate) .Marland Kitchen.. 1 By Mouth Every Other Day As Needed As Needed 4)  Apri 0.15-30 Mg-Mcg Tabs (Desogestrel-Ethinyl Estradiol) .... Take One Tab By Mouth Once Daily 5)  Alprazolam 1 Mg Tabs (Alprazolam) .Marland Kitchen.. 1 By Mouth Two Times A Day As Needed 6)  Fluticasone Propionate 50 Mcg/act Susp (Fluticasone Propionate) .... 2 Spray/side Once Daily 7)  Meclizine Hcl 12.5 Mg Tabs (Meclizine Hcl) .Marland Kitchen.. 1 By Mouth Q 6 Hrs Prn Dizziness 8)  Flexeril 5 Mg Tabs (Cyclobenzaprine Hcl) .Marland Kitchen.. 1 By Mouth Three Times A Day As Needed 9)  Percocet 5-325 Mg Tabs (Oxycodone-Acetaminophen) .... By Mouth Q 6 Hrs As Needed 10)  Prednisone 10 Mg Tabs (Prednisone) .... Asd  Allergies (verified): 1)  ! Sulfa 2)  ! * Avelox 3)  Doxycycline  Past History:  Past Medical History: Last updated: 11/29/2007 Allergic rhinitis Anxiety GERD arthritis migraines, hx endometriosis/?PCOS - infertile Depression  Past Surgical History: Last updated: 04/13/2007 ear tubes in childhood ganglion cyst in right wrist endometriosis  Social History: Last updated: 11/29/2007 Married lives with spouse Current Smoker-recently started Alcohol use-yes-0-2, less than monthly Drug use-no no children work - [employer] administrator  Risk Factors: Smoking Status: current (04/13/2007)  Review of Systems       all otherwise negative per pt -    Physical Exam  General:  alert and overweight-appearing.   Head:  normocephalic and atraumatic.   Eyes:  vision grossly intact, pupils equal, and pupils round.   Ears:  L ear normal.  with right TM moderate erythema and bulging Nose:  no external deformity and no nasal discharge.   Mouth:  no gingival abnormalities and pharynx pink and moist.   Neck:  supple and no masses.   Lungs:  normal respiratory effort and normal breath sounds.   Heart:  normal rate and regular rhythm.   Abdomen:  soft, non-tender, and normal bowel sounds.   Msk:  tender right lumbar paravertebral tender  with spasm only,  no spine tender; right knee with 1-2+ effusion, decreased ROM to full flexion with pain and marked pain on ROM but no tenderness to palpation Extremities:  no edema, no erythema  Neurologic:  cranial nerves II-XII intact and strength normal in all extremities.  , gait antalgic favoring the RLE   Impression & Recommendations:  Problem # 1:  KNEE PAIN, RIGHT (ICD-719.46)  The following medications were removed from the medication list:    Cyclobenzaprine Hcl 5 Mg Tabs (Cyclobenzaprine hcl) .Marland Kitchen... 1-2 by mouth every 8 hours orn for muscle spasms Her updated medication list for this problem includes:    Flexeril 5 Mg Tabs (Cyclobenzaprine hcl) .Marland Kitchen... 1 by mouth three times a day as needed    Percocet 5-325 Mg Tabs (Oxycodone-acetaminophen) ..... By mouth q 6 hrs as needed prob cartilage  tear,  for MRI right knee, refer Dr Irving Copas, also note for work - on stairs, ladders, squatting or heavy lifitng  Orders: Radiology Referral (Radiology) Orthopedic Surgeon Referral (Ortho Surgeon)  Problem # 2:  BACK PAIN (ICD-724.5)  The following medications were removed from the medication list:    Cyclobenzaprine Hcl 5 Mg Tabs (Cyclobenzaprine hcl) .Marland Kitchen... 1-2 by mouth every 8 hours orn for muscle spasms Her updated medication list for this problem includes:    Flexeril 5 Mg Tabs (Cyclobenzaprine hcl) .Marland Kitchen... 1 by mouth three times a day as needed    Percocet 5-325 Mg Tabs (Oxycodone-acetaminophen) ..... By mouth q 6 hrs as needed c/w MSK spasm most liekly; treat as above, f/u any worsening signs or symptoms   Problem # 3:  MIGRAINE HEADACHE (ICD-346.90)  Her updated medication list for this problem includes:    Axert 12.5 Mg Tabs (Almotriptan malate) .Marland Kitchen... 1 by mouth every other day as needed as needed    Percocet 5-325 Mg Tabs (Oxycodone-acetaminophen) ..... By mouth q 6 hrs as needed imitrex causes choking sensation , like maxalt previously - to try axert as needed   Problem #  4:  EAR PAIN, BILATERAL (ICD-388.70)  The following medications were removed from the medication list:    Azithromycin 250 Mg Tabs (Azithromycin) .Marland Kitchen... 2po qd for 1 day, then 1po qd for 4days, then stop  Orders: ENT Referral (ENT) treat as above, f/u any worsening signs or symptoms   Complete Medication List: 1)  Proair Hfa 108 (90 Base) Mcg/act Aers (Albuterol sulfate) .... 2 puffs qid as needed 2)  Furosemide 40 Mg Tabs (Furosemide) .Marland Kitchen.. 1po once daily 3)  Axert 12.5 Mg Tabs (Almotriptan malate) .Marland Kitchen.. 1 by mouth every other day as needed as needed 4)  Apri 0.15-30 Mg-mcg Tabs (Desogestrel-ethinyl estradiol) .... Take one tab by mouth once daily 5)  Alprazolam 1 Mg Tabs (Alprazolam) .Marland Kitchen.. 1 by mouth two times a day as needed 6)  Fluticasone Propionate 50 Mcg/act Susp (Fluticasone propionate) .... 2 spray/side once daily 7)  Meclizine Hcl 12.5 Mg Tabs (Meclizine hcl) .Marland Kitchen.. 1 by mouth q 6 hrs prn dizziness 8)  Flexeril 5 Mg Tabs (Cyclobenzaprine hcl) .Marland Kitchen.. 1 by mouth three times a day as needed 9)  Percocet 5-325 Mg Tabs (Oxycodone-acetaminophen) .... By mouth q 6 hrs as needed 10)  Prednisone 10 Mg Tabs (Prednisone) .... Asd  Patient Instructions: 1)  OK to take the medications as per urgent care (the prednisone and percocet) 2)  Please take all new medications as prescribed  - the flexeril for the right lower back spasm 3)  You will be contacted about the referral(s) to: MRI for the right knee, and Dr Ranell Patrick, and ENT 4)  You are given the work note as well 5)  stop the sumatriptan 6)  take the axert instead as needed  7)  Please schedule a follow-up appointment as needed. Prescriptions: AXERT 12.5 MG TABS (ALMOTRIPTAN MALATE) 1 by mouth every other day as needed as needed  #10 x 11   Entered and Authorized by:   Corwin Levins MD   Signed by:   Corwin Levins MD on 12/18/2009   Method used:   Print then Give to Patient   RxID:   [id number] FLEXERIL 5 MG TABS (CYCLOBENZAPRINE HCL) 1  by mouth three times a day as needed  #60 x 0   Entered and Authorized by:   Corwin Levins MD   Signed by:   Len Blalock  Braun Rocca MD on 12/18/2009   Method used:   Print then Give to Patient   RxID:   [id number]

## 2010-06-10 NOTE — Letter (Signed)
Summary: Generic Letter  [facility]  [address] [place], [place] [address]   Phone: [phone]  Fax: [phone]    12/18/2009  [facility] [address] [place], [place]  [address]  Dear Ms. HARVEY,       This note is to state until further notice (after   the MRI and orthopedic consultation), you should be limited  at work to No Stair climbing, No Ladder climbing, no  squatting, and No heavy lifting more than 25 lbs.       Sincerely,   Oliver Barre MD

## 2010-06-10 NOTE — Letter (Signed)
Summary: Out of Work  [facility]  [address] [place], [place] [address]   Phone: [phone]  Fax: [phone]    December 22, 2009   Employee:  [name]    To Whom It May Concern:   For Medical reasons, please excuse the above named employee from work for the following dates:  Start:   12/21/2009  End:   12/28/2009  If you need additional information, please feel free to contact our office.         Sincerely,    Dr. Oliver Barre

## 2010-06-10 NOTE — Assessment & Plan Note (Signed)
Summary: R EAR PAIN--SINUS PROBLEM  STC   Vital Signs:  Patient profile:   40 year old female Height:      68 inches Weight:      259.25 pounds BMI:     39.56 O2 Sat:      98 % on Room air Temp:     98 degrees F oral Pulse rate:   101 / minute BP sitting:   118 / 74  (left arm) Cuff size:   large  Vitals Entered ByMarland Kitchen Zella Ball Ewing (August 26, 2009 3:49 PM)  O2 Flow:  Room air CC: Right ear pain, dizziness, sinus congestion/RE   Primary Care Provider:  Corwin Levins MD  CC:  Right ear pain, dizziness, and sinus congestion/RE.  History of Present Illness: here with acute onset mild to mod x 3 days facial pain, pressure, fever and greenish d/c, with slight ST and mild positional veritgo, on top of uncontrolled sinus congestion for several wks with clearish d/c and sneezing;  also with marked increased stressors recent and anxiety mostly work related  - feels severe tension just walking into work every day due to overwork responsibilites as others have been laid off;  no depressive symptoms or suicidal ideation, or panic, even though a favorite aunt died recently.  Has had mild increased pedal and LE edema recent despite good med complaince with the HCTZ and trying to aovid salt and incresed fluids such as for wt loss;  does stand all day at work ([employer]) .  Pt denies CP, sob, doe, wheezing, orthopnea, pnd, palps, dizziness or syncope .  Claritin D not helping the ear.  Did see Dr Lajoyce Corners , podiatry recently for right plantar fascitis, with some improvement after steroid pills and relafen.    Problems Prior to Update: 1)  Edema  (ICD-782.3) 2)  Sinusitis- Acute-nos  (ICD-461.9) 3)  Sinusitis  (ICD-473.9) 4)  Peripheral Edema  (ICD-782.3) 5)  Back Pain  (ICD-724.5) 6)  Ingrown Toenail  (ICD-703.0) 7)  Ruq Pain  (ICD-789.01) 8)  Acute Bronchospasm  (ICD-519.11) 9)  Obesity, Morbid  (ICD-278.01) 10)  Otitis Media, Acute, Right  (ICD-382.9) 11)  Depression  (ICD-311) 12)   Insomnia-sleep Disorder-unspec  (ICD-780.52) 13)  Rash-nonvesicular  (ICD-782.1) 14)  Cervical Strain  (ICD-847.0) 15)  Otitis Media, Acute  (ICD-382.9) 16)  Endometriosis  (ICD-617.9) 17)  Gerd  (ICD-530.81) 18)  Anxiety  (ICD-300.00) 19)  Allergic Rhinitis  (ICD-477.9) 20)  Migraine Headache  (ICD-346.90) 21)  Heartburn  (ICD-787.1)  Medications Prior to Update: 1)  Proair Hfa 108 (90 Base) Mcg/act Aers (Albuterol Sulfate) .... 2 Puffs Qid As Needed 2)  Hydrochlorothiazide 25 Mg Tabs (Hydrochlorothiazide) .Marland Kitchen.. 1po Once Daily 3)  Sumatriptan Succinate 100 Mg Tabs (Sumatriptan Succinate) .Marland Kitchen.. 1 By Mouth Every Other Day As Needed 4)  Apri 0.15-30 Mg-Mcg Tabs (Desogestrel-Ethinyl Estradiol) .... Take One Tab By Mouth Once Daily 5)  Alprazolam 0.5 Mg Tabs (Alprazolam) .... 1/2 - 1 By Mouth Two Times A Day As Needed 6)  Cyclobenzaprine Hcl 5 Mg Tabs (Cyclobenzaprine Hcl) .Marland Kitchen.. 1-2 By Mouth Every 8 Hours Orn For Muscle Spasms 7)  Promethazine Vc/codeine 6.25-5-10 Mg/88ml Syrp (Phenyleph-Promethazine-Cod) .... 5 Cc By Mouth Every 4 Hours As Needed For Cough - Especially Night Symptoms  Current Medications (verified): 1)  Proair Hfa 108 (90 Base) Mcg/act Aers (Albuterol Sulfate) .... 2 Puffs Qid As Needed 2)  Furosemide 40 Mg Tabs (Furosemide) .Marland Kitchen.. 1po Once Daily 3)  Sumatriptan Succinate 100 Mg  Tabs (Sumatriptan Succinate) .Marland Kitchen.. 1 By Mouth Every Other Day As Needed 4)  Apri 0.15-30 Mg-Mcg Tabs (Desogestrel-Ethinyl Estradiol) .... Take One Tab By Mouth Once Daily 5)  Alprazolam 1 Mg Tabs (Alprazolam) .Marland Kitchen.. 1 By Mouth Two Times A Day As Needed 6)  Cyclobenzaprine Hcl 5 Mg Tabs (Cyclobenzaprine Hcl) .Marland Kitchen.. 1-2 By Mouth Every 8 Hours Orn For Muscle Spasms 7)  Promethazine Vc/codeine 6.25-5-10 Mg/15ml Syrp (Phenyleph-Promethazine-Cod) .... 5 Cc By Mouth Every 4 Hours As Needed For Cough - Especially Night Symptoms 8)  Azithromycin 250 Mg Tabs (Azithromycin) .... 2po Qd For 1 Day, Then 1po Qd For 4days,  Then Stop 9)  Fluticasone Propionate 50 Mcg/act Susp (Fluticasone Propionate) .... 2 Spray/side Once Daily 10)  Meclizine Hcl 12.5 Mg Tabs (Meclizine Hcl) .Marland Kitchen.. 1 By Mouth Q 6 Hrs Prn Dizziness  Allergies (verified): 1)  ! Sulfa 2)  ! * Avelox 3)  Doxycycline  Past History:  Past Medical History: Last updated: 11/29/2007 Allergic rhinitis Anxiety GERD arthritis migraines, hx endometriosis/?PCOS - infertile Depression  Past Surgical History: Last updated: 04/13/2007 ear tubes in childhood ganglion cyst in right wrist endometriosis  Social History: Last updated: 11/29/2007 Married lives with spouse Current Smoker-recently started Alcohol use-yes-0-2, less than monthly Drug use-no no children work - [employer] administrator  Risk Factors: Smoking Status: current (04/13/2007)  Review of Systems       all otherwise negative per pt -    Physical Exam  General:  alert and overweight-appearing.  , mild ill  Head:  normocephalic and atraumatic.   Eyes:  vision grossly intact, pupils equal, and pupils round.   Ears:  bialt TM mod to severe red with mild bulging;   bialt canals ok,  sinus tender bilat Nose:  nasal dischargemucosal pallor and mucosal edema.   Mouth:  pharyngeal erythema and fair dentition.   Neck:  supple and no masses.   Lungs:  normal respiratory effort and normal breath sounds.   Heart:  normal rate and regular rhythm.   Extremities:  trace to 1+ bilat LE swellings, without weeping or ulcers; numerous varicosities noted Neurologic:  strength normal in all extremities, gait normal, and DTRs symmetrical and normal.   Psych:  not depressed appearing and moderately anxious.     Impression & Recommendations:  Problem # 1:  SINUSITIS- ACUTE-NOS (ICD-461.9)  Her updated medication list for this problem includes:    Promethazine Vc/codeine 6.25-5-10 Mg/1ml Syrp (Phenyleph-promethazine-cod) .Marland KitchenMarland KitchenMarland KitchenMarland Kitchen 5 cc by mouth every 4 hours as needed for cough - especially  night symptoms    Azithromycin 250 Mg Tabs (Azithromycin) .Marland Kitchen... 2po qd for 1 day, then 1po qd for 4days, then stop    Fluticasone Propionate 50 Mcg/act Susp (Fluticasone propionate) .Marland Kitchen... 2 spray/side once daily wth bilat ear involved - for antibx, cont OTC claritin d, mucinex  Problem # 2:  ALLERGIC RHINITIS (ICD-477.9)  add flonase nasal asd, nettite pott as needed, to ent and or allergy if not improved  Her updated medication list for this problem includes:    Fluticasone Propionate 50 Mcg/act Susp (Fluticasone propionate) .Marland Kitchen... 2 spray/side once daily  Problem # 3:  ANXIETY (ICD-300.00)  Her updated medication list for this problem includes:    Alprazolam 1 Mg Tabs (Alprazolam) .Marland Kitchen... 1 by mouth two times a day as needed treat as above, f/u any worsening signs or symptoms , no sign of worsening depression  Problem # 4:  EDEMA (ICD-782.3)  Her updated medication list for this problem includes:  Furosemide 40 Mg Tabs (Furosemide) .Marland Kitchen... 1po once daily likely due to worsening venous insuff - to d/c the hctz, add lasix 40 - 1/2 to 1 once daily ; doubt CHF, PAH or other at this time but consdier w/u if worsening  Complete Medication List: 1)  Proair Hfa 108 (90 Base) Mcg/act Aers (Albuterol sulfate) .... 2 puffs qid as needed 2)  Furosemide 40 Mg Tabs (Furosemide) .Marland Kitchen.. 1po once daily 3)  Sumatriptan Succinate 100 Mg Tabs (Sumatriptan succinate) .Marland Kitchen.. 1 by mouth every other day as needed 4)  Apri 0.15-30 Mg-mcg Tabs (Desogestrel-ethinyl estradiol) .... Take one tab by mouth once daily 5)  Alprazolam 1 Mg Tabs (Alprazolam) .Marland Kitchen.. 1 by mouth two times a day as needed 6)  Cyclobenzaprine Hcl 5 Mg Tabs (Cyclobenzaprine hcl) .Marland Kitchen.. 1-2 by mouth every 8 hours orn for muscle spasms 7)  Promethazine Vc/codeine 6.25-5-10 Mg/50ml Syrp (Phenyleph-promethazine-cod) .... 5 cc by mouth every 4 hours as needed for cough - especially night symptoms 8)  Azithromycin 250 Mg Tabs (Azithromycin) .... 2po qd for  1 day, then 1po qd for 4days, then stop 9)  Fluticasone Propionate 50 Mcg/act Susp (Fluticasone propionate) .... 2 spray/side once daily 10)  Meclizine Hcl 12.5 Mg Tabs (Meclizine hcl) .Marland Kitchen.. 1 by mouth q 6 hrs prn dizziness  Patient Instructions: 1)  Please take all new medications as prescribed 2)  Continue all previous medications as before this visit  3)  call inf 1 to 2 wks if you would need the referral to the ENT 4)  Please schedule a follow-up appointment in 2 months. with CPX labs  Prescriptions: MECLIZINE HCL 12.5 MG TABS (MECLIZINE HCL) 1 by mouth q 6 hrs prn dizziness  #40 x 1   Entered and Authorized by:   Corwin Levins MD   Signed by:   Corwin Levins MD on 08/26/2009   Method used:   Print then Give to Patient   RxID:   [id number] FUROSEMIDE 40 MG TABS (FUROSEMIDE) 1po once daily  #90 x 3   Entered and Authorized by:   Corwin Levins MD   Signed by:   Corwin Levins MD on 08/26/2009   Method used:   Print then Give to Patient   RxID:   [id number] ALPRAZOLAM 1 MG TABS (ALPRAZOLAM) 1 by mouth two times a day as needed  #60 x 2   Entered and Authorized by:   Corwin Levins MD   Signed by:   Corwin Levins MD on 08/26/2009   Method used:   Print then Give to Patient   RxID:   [id number] FLUTICASONE PROPIONATE 50 MCG/ACT SUSP (FLUTICASONE PROPIONATE) 2 spray/side once daily  #1 x 11   Entered and Authorized by:   Corwin Levins MD   Signed by:   Corwin Levins MD on 08/26/2009   Method used:   Print then Give to Patient   RxID:   [id number] AZITHROMYCIN 250 MG TABS (AZITHROMYCIN) 2po qd for 1 day, then 1po qd for 4days, then stop  #6 x 1   Entered and Authorized by:   Corwin Levins MD   Signed by:   Corwin Levins MD on 08/26/2009   Method used:   Print then Give to Patient   RxID:   [id number]

## 2010-08-11 LAB — COMPREHENSIVE METABOLIC PANEL
ALT: 30 U/L (ref 0–35)
Alkaline Phosphatase: 56 U/L (ref 39–117)
CO2: 27 mEq/L (ref 19–32)
Calcium: 9 mg/dL (ref 8.4–10.5)
GFR calc non Af Amer: >60 mL/min (ref >60)
Glucose, Bld: 85 mg/dL (ref 70–99)
Potassium: 3.6 mEq/L (ref 3.5–5.1)
Sodium: 138 mEq/L (ref 135–145)

## 2010-08-11 LAB — POCT CARDIAC MARKERS
CKMB, poc: 1.2 ng/mL (ref 1.0–8.0)
Troponin i, poc: <0.05 ng/mL (ref 0.00–0.09)

## 2010-08-11 LAB — LIPASE, BLOOD: Lipase: 26 U/L (ref 11–59)

## 2010-08-11 LAB — DIFFERENTIAL
Basophils Absolute: 0 10*3/uL (ref 0.0–0.1)
Lymphocytes Relative: 30 % (ref 12–46)
Neutro Abs: 7.2 10*3/uL (ref 1.7–7.7)
Neutrophils Relative %: 63 % (ref 43–77)

## 2010-08-11 LAB — CBC
Platelets: 400 10*3/uL (ref 150–400)
RDW: 12.8 % (ref 11.5–15.5)

## 2010-08-16 LAB — CBC
HCT: 38.6 % (ref 36.0–46.0)
Hemoglobin: 11.9 g/dL — ABNORMAL LOW (ref 12.0–15.0)
Hemoglobin: 12 g/dL (ref 12.0–15.0)
Hemoglobin: 13.4 g/dL (ref 12.0–15.0)
Hemoglobin: 14.5 g/dL (ref 12.0–15.0)
MCHC: 34.3 g/dL (ref 30.0–36.0)
MCHC: 34.6 g/dL (ref 30.0–36.0)
MCHC: 34.7 g/dL (ref 30.0–36.0)
MCHC: 34.8 g/dL (ref 30.0–36.0)
MCV: 90.8 fL (ref 78.0–100.0)
MCV: 91.5 fL (ref 78.0–100.0)
Platelets: 243 10*3/uL (ref 150–400)
RBC: 3.72 MIL/uL — ABNORMAL LOW (ref 3.87–5.11)
RBC: 4.22 MIL/uL (ref 3.87–5.11)
RBC: 4.65 MIL/uL (ref 3.87–5.11)
RDW: 12.9 % (ref 11.5–15.5)
RDW: 13.2 % (ref 11.5–15.5)
RDW: 13.3 % (ref 11.5–15.5)

## 2010-08-16 LAB — DIFFERENTIAL
Basophils Absolute: 0.1 10*3/uL (ref 0.0–0.1)
Basophils Absolute: 0.1 10*3/uL (ref 0.0–0.1)
Basophils Relative: 0 % (ref 0–1)
Eosinophils Absolute: 0.1 10*3/uL (ref 0.0–0.7)
Eosinophils Relative: 0 % (ref 0–5)
Lymphs Abs: 2.5 10*3/uL (ref 0.7–4.0)
Monocytes Absolute: 0.7 10*3/uL (ref 0.1–1.0)
Monocytes Relative: 4 % (ref 3–12)
Neutrophils Relative %: 84 % — ABNORMAL HIGH (ref 43–77)

## 2010-08-16 LAB — URINALYSIS, ROUTINE W REFLEX MICROSCOPIC
Glucose, UA: NEGATIVE mg/dL
Hgb urine dipstick: NEGATIVE
Ketones, ur: NEGATIVE mg/dL
Protein, ur: NEGATIVE mg/dL
pH: 5.5 (ref 5.0–8.0)

## 2010-08-16 LAB — COMPREHENSIVE METABOLIC PANEL
ALT: 16 U/L (ref 0–35)
CO2: 27 mEq/L (ref 19–32)
Calcium: 9.3 mg/dL (ref 8.4–10.5)
Chloride: 104 mEq/L (ref 96–112)
Creatinine, Ser: 0.84 mg/dL (ref 0.4–1.2)
GFR calc non Af Amer: >60 mL/min (ref >60)
Glucose, Bld: 83 mg/dL (ref 70–99)
Sodium: 139 mEq/L (ref 135–145)
Total Bilirubin: 0.9 mg/dL (ref 0.3–1.2)

## 2010-08-16 LAB — LIPASE, BLOOD: Lipase: 23 U/L (ref 11–59)

## 2010-08-16 LAB — GC/CHLAMYDIA PROBE AMP, GENITAL: GC Probe Amp, Genital: NEGATIVE

## 2010-09-21 NOTE — Discharge Summary (Signed)
[name], [name]               ACCOUNT NO.:  [id number]   MEDICAL RECORD NO.:  [id number]          PATIENT TYPE:  INP   LOCATION:  9303                          FACILITY:  [facility]   PHYSICIAN:  Duke Salvia. Marcelle Overlie, M.D.DATE OF BIRTH:  1971-03-06   DATE OF ADMISSION:  11/02/2008  DATE OF DISCHARGE:  11/04/2008                               DISCHARGE SUMMARY   DISCHARGE DIAGNOSES:  1. Acute pelvic pain.  2. Ruptured ovarian cyst.  3. Severe pelvic endometriosis.  4. Diagnostic laparoscopy at this admission.   SUMMARY OF HISTORY AND PHYSICAL EXAM:  Please see admission H and P for  details.  Briefly, a 40 year old, G0, P0 with a long history of  infertility and endometriosis was admitted with sudden onset of right  lower quadrant pain which was evaluated at [facility] ED with exam and  CT.  WBC was 21,000 there on admission.  She was transferred care here  to [facility].   HOSPITAL COURSE:  After reviewing the imaging studies and examination,  decision made to proceed with laparoscopy because of the possibility of  ovarian torsion.  Findings at the time of surgery showed severe  endometriosis.  She had drainage of 2-3 chocolate cyst.  That was on  June 27, the a.m. of June 28 which is postop day #1, she was afebrile.  Her white count was 15.9, hemoglobin 12.0.   By the a.m. of June 29 was afebrile, tolerating a regular diet, positive  flatus was noted.  Her abdominal exam was unremarkable.  WBC 11.9,  hemoglobin 11.9, hematocrit 34.3, and she was ready for discharge at  that point.   LABORATORY DATA:  WBC on June 27 16.5, hemoglobin 13.4, hematocrit 38.6.  On June 29, WBC 11.9, hemoglobin 11.9, hematocrit 34.3.  Pelvic  ultrasound at [facility] ED cystic left adnexal mass, left ovarian  cyst.   DISPOSITION:  The patient is discharged on Tylox p.r.n. pain, Ceftin 250  p.o. b.i.d. x5.  Return to the office in 1 week.  Advised to report any  incisional redness or drainage,  increased pain or bleeding or fever over  101.  She was given specific instructions regarding diet, sex, exercise.   CONDITION:  Good.   ACTIVITY:  Graded increase.      Richard M. Marcelle Overlie, M.D.  Electronically Signed     RMH/MEDQ  D:  11/04/2008  T:  11/04/2008  Job:  [id number]

## 2010-09-21 NOTE — Op Note (Signed)
NAMEELSEY, [name]               ACCOUNT NO.:  [id number]   MEDICAL RECORD NO.:  [id number]          PATIENT TYPE:  INP   LOCATION:  9303                          [facility]ACILITY:  [facility]   PHYSICIAN:  Freddy Finner, M.D.   DATE OF BIRTH:  10/03/1970   DATE OF PROCEDURE:  11/02/2008  DATE OF DISCHARGE:                               OPERATIVE REPORT   PREOPERATIVE DIAGNOSES:  Bilateral complex adnexal masses, suspected  torsion of right ovary, previous history of endometriosis, previous  history of prolonged infertility.   POSTOPERATIVE DIAGNOSES:  Ruptured endometrioma probably of right ovary,  no evidence of torsion of right ovary, complex double cysts of the left  ovary - one a hemorrhagic follicular type cyst and the other an  endometrioma, endometrioma of right ovary, hydrosalpinx of left  fallopian tube, paratubal peritoneal cyst on the left, scattered  endometriotic material throughout the abdomen including the right  hemidiaphragm.   OPERATIVE PROCEDURE:  Laparoscopy, aspiration of all three of the cysts  noted above, irrigation of pelvis and abdomen.   ANESTHESIA:  General endotracheal.   ESTIMATED INTRAOPERATIVE BLOOD LOSS:  Less than 10 mL of new blood loss.   INTRAOPERATIVE COMPLICATIONS:  None.   PROCEDURE IN DETAIL:  The patient was brought to the operating room.  She was given a bolus of Ancef preoperatively.  She was placed under  adequate general endotracheal anesthesia and placed in dorsal lithotomy  position using the [employer] system.  Abdomen, perineum and vagina  were prepped with Betadine solution.  Foley catheter was placed using  sterile technique.  Hulka tenaculum was attached to the cervix under  direct visualization.  Sterile drapes were applied.  Two small incisions  were made in the abdomen, one at the umbilicus and one just above the  symphysis in the midline.  An 11 mm bladed disposable trocar was  introduced through the umbilical incision while  elevating the anterior  abdominal wall manually.  Direct inspection revealed adequate placement  of the entry without injury.  Pneumoperitoneum was allowed to accumulate  using carbon dioxide gas.  A 5 mm trocar was placed in the lower  incision under direct visualization through a blunt probe and later the  Nezhat system was used for retraction and exposure.  Pelvic and  abdominal contents were recorded in still photographs which were  retained in the office record.  The cul-de-sac was essentially  obliterated.  There was scattered dark brownish material consistent with  the chocolate material present in the endometrioma.  This was scattered  on the omentum and the pelvis, over the bladder and even on the right  hemidiaphragm or at least on the thoracic portion of the hemidiaphragm.  Careful examination of the abdominal contents and pelvic contents was  carried out.  The liver appeared to be normal.  The appendix could not  be visualized (this was noted to be normal by CT scan).  There was no  evidence of infection or purulent material anywhere in the abdomen.  The  right ovary was aspirated using a long needle with a syringe and the  material aspirated was consistent with the chocolate material of  endometriosis.  Each of the other two cysts which were both on the left  side were aspirated, one had serosanguineous fluid and one had chocolate  material.  There was no active bleeding from any of the aspiration  sites.  All three aspirates were submitted for histologic exam.  The  Nezhat system was then used to irrigate and remove a major portion of  the chocolate material which had spilled into the abdomen.  Irrigating  solution was also aspirated.  Hemostasis was complete.  All pack, needle  and instrument counts were correct.  Gas was allowed to escape from the  abdomen and  instruments removed.  Incisions were anesthetized with 0.25% plain  Marcaine and closed with interrupted  subcuticular stitches of 3-0 Dexon  and Steri-Strips were applied to the lower incision.  The patient was  awakened and taken to recovery in good condition.      Freddy Finner, M.D.  Electronically Signed     WRN/MEDQ  D:  11/02/2008  T:  11/02/2008  Job:  [id number]

## 2010-09-21 NOTE — H&P (Signed)
[name], [name]               ACCOUNT NO.:  [id number]   MEDICAL RECORD NO.:  [id number]          PATIENT TYPE:  INP   LOCATION:  9303                          [facility]ACILITY:  [facility]   PHYSICIAN:  Freddy Finner, M.D.   DATE OF BIRTH:  09-12-1970   DATE OF ADMISSION:  11/02/2008  DATE OF DISCHARGE:                              HISTORY & PHYSICAL   ADMITTING DIAGNOSIS:  Lower abdominal pain with the sudden onset of  severe right lower quadrant abdominal pain, now __________to both sides,  but still greater over the right.  Extensive evaluation at [facility] after which she was transported to the [facility] for me  to assume her care.   She is a 40 year old, white, married female, nulligravida, who has a  very long history of infertility and it is known by previous laparoscopy  done by Dr. Richardean Sale in 2007 to have extensive endometriosis and  pelvic adhesions.  She has abandoned infertility evaluation.  She was in  her usual state of good health with no complaints until yesterday  afternoon on June 26th when she noted the onset of right lower quadrant  pain, which progressively increased and became very severe, which  prompted her to go to the [facility].  At that  institution, she was evaluated by the medical staff and had a CT scan,  which showed a normal appendix but showed bilateral complex adnexal  masses.  On an ultrasound, these were shown to be complex, but the one  on the right did not appear to have blood flow.  No other abnormalities  were noted within the abdomen.  The patient's white count was 21,000 on  admission at [facility].   PAST MEDICAL HISTORY:  1. Illnesses:  None.  2. Allergies:  AVELOX.  3. Medical conditions:  Migraine for which she uses Excedrin Migraine      and Imitrex.  4. Past surgical procedures include: The laparoscopy noted above and      other minor procedures over time; nothing of significance.  5. Never had a  blood transfusion.  6. She does not use cigarettes.  7. She does not use alcohol except very, very rarely.   FAMILY HISTORY:  Noncontributory.   PHYSICAL EXAMINATION:  HEENT:  Grossly within normal limits.  VITAL SIGNS:  The patient is afebrile.  Vital signs are stable.  GENERAL:  She is alert, oriented, and cooperative.  CHEST:  Clear to auscultation throughout.  HEART:  Normal sinus rhythm without murmurs, rubs, or gallops.  ABDOMEN:  Remarkable for generalized mild tenderness greatest in the  right lower quadrant with rebound in the right lower quadrant.  No  palpable organomegaly is noted.  There is no CVA tenderness.  EXTREMITIES:  Without cyanosis, clubbing, or edema.   In view of the x-ray findings, the pelvic examination is deferred and  also due to the discomfort level of the patient.   ASSESSMENT:  Significant concern for right ovarian torsion with complex  left adnexal mass most consistent by history with a possible  hydrosalpinx and hemorrhagic follicular cysts.  PLAN:  Laparoscopy and possible laparotomy.  The procedures have been  discussed at length with the patient with the understanding that we will  do only what we have to do today with a concern over torsion of the  right ovary and the potential need to remove this ovary.      Freddy Finner, M.D.  Electronically Signed     WRN/MEDQ  D:  11/02/2008  T:  11/02/2008  Job:  [id number]

## 2010-09-24 NOTE — Op Note (Signed)
[name], [name]               ACCOUNT NO.:  [id number]   MEDICAL RECORD NO.:  [id number]          PATIENT TYPE:  AMB   LOCATION:  SDC                           FACILITY:  [facility]   PHYSICIAN:  Richardean Sale, M.D.   DATE OF BIRTH:  [birth year]   DATE OF PROCEDURE:  06/29/2005  DATE OF DISCHARGE:                                 OPERATIVE REPORT   PREOPERATIVE DIAGNOSES:  1.  Pelvic pain.  2.  Infertility.   POSTOPERATIVE DIAGNOSES:  1.  Pelvic pain.  2.  Infertility.  3.  Endometriosis.  4.  Pelvic adhesions.   OPERATION/PROCEDURE:  1.  Laparoscopy.  2.  Lysis of adhesions.  3.  Fulguration of endometriosis.  4.  Peritoneal biopsies.  5.  Left ovarian cyst.  6.  Chromopertubation.   ANESTHESIA:  General.   COMPLICATIONS:  None.   ESTIMATED BLOOD LOSS:  Minimal.   FINDINGS:  Normal appearing uterus, left ovary with approximately 1.5 cm  endometrioma.  Adhesions involving the left fallopian tube fimbria to the  endometrioma.  Right ovary with dense adhesions and the ovary stuck in the  posterior cul-de-sac and adhesions to the sigmoid colon.  Multiple small  peritoneal inclusion cysts adjacent to the right ovary.  Endometrial  implants along the posterior cul-de-sac, uterosacral ligament and over the  anterior cul-de-sac.  A retrocecal appendix with no obvious endometriosis.  Normal-appearing liver.  Gallbladder poorly visualized secondary to the  patient's morbid obesity.   SPECIMENS:  Ovarian cyst wall, ovarian adhesions, peritoneal biopsies all  sent to pathology.   INDICATIONS:  This is a 40 year old, gravida 0, white female with a history  of progressively worsening dysmenorrhea and pelvic pain as well as a seven-  year history of infertility.  The patient underwent an HSG which showed a  patent right fallopian tube but there was questionable diligent dye from the  left fallopian tube.  She presents today for diagnostic laparoscopy,  chromopertubation, possible  fulguration or excision of endometriosis.  Prior  to the procedure, the risks, benefits and alternatives of the procedure have  been reviewed with the patient in detail.  We discussed risks which include  but are not limited to hemorrhage requiring transfusion, infection, injury  to the bowel or the bladder, the ureters, the uterus, the ovaries, or any  other organs which could require additional surgery either at the time of  this procedure or in the future.  We reviewed risks of anesthesia including  DVT, pulmonary embolus.  The patient voiced understanding of all these risks  and desired to proceed.  Informed consent has been obtained.   DESCRIPTION OF PROCEDURE:  The patient was taken to the operating room where  she was given a general anesthetic. She was then prepped and draped in the  usual sterile fashion with Betadine and a Foley catheter was placed.  Bimanual exam revealed normal size uterus.  Ovaries were difficult to feel  secondary to the patient's habitus.  There was no obvious cul-de-sac  nodularity.  The speculum was then placed in the vagina and the single-tooth  tenaculum was  used to grasp the anterior lip of the cervix.  The Cohn  cannula was then introduced into the cervix and the speculum was removed.  Sterile gown and gloves were then changed and attention was then turned to  the patient's abdomen where 5 mL of  0.5% plain Marcaine was injected into  the infraumbilical skin and a 10 mm skin incision was then made.  This was  carried down sharply to the fascia.  There was a significant amount of  subcutaneous fat making visualization quite difficult.  When the fascia was  finally grasped, it was entered sharply.  The peritoneum was difficult to  identify secondary to an extensive amount of preperitoneal adipose tissue.  The peritoneum was ultimately grasped between two hemostats and entered  sharply.  The extra-long Hasson trocar sleeve had to be used in order to   insert the 10 mm port.  Once this was accomplished and intra-abdominal  placement was confirmed, CO2 gas was then allowed to insufflate.  A second 5  mm skin incision was then made in the suprapubic area 2 cm above the  symphysis pubis, first by injecting the 5 mL of 0.5% plain Marcaine.  The 5  mm trocar and sleeve were then introduced under direct visualization and the  pelvis was then examined.  It was very difficult to visualize the patient's  right ovary as it was adherent to the posterior cul-de-sac.  There was  evidence of endometriosis in the anterior cul-de-sac and also an  approximately 1.5 cm bluish cyst originating from the left ovary to which  the left fimbria were attached.  A third incision was then made in the  patient's left lower quadrant by injecting 3 mL of 0.5% plain Marcaine.  This incision was then made and the trocar and sleeve were introduced under  direct visualization, free of any major vasculature.  Using an atraumatic  grasper and the Gyrus dissecting forceps, the endometrioma originating from  the left ovary was excised and sent to pathology labeled as a left ovarian  cyst wall.  In the process of doing so, the fimbria from the left tube were  freed from the ovary.  At this point the attention was then turned to right  side of the pelvis where the right fallopian tube appeared normal.  There  was significant adhesive disease involving the right ovary and using very  careful dissection, the right ovary was ultimately freed from the posterior  cul-de-sac and sigmoid colon without any obvious trauma to the colon or any  other surrounding structures.  There were multiple what appeared to be  peritoneal inclusion cysts in the posterior cul-de-sac adjacent to the right  ovary.  These were removed and sent to pathology labeled as peritoneal  occlusion cysts.  The adhesions from the right ovary were sent to pathology as well.  The bipolar cautery was used to cauterize  any areas of bleeding on  the pelvic sidewall and posterior cul-de-sac where the ovary had been freed  from.  Once hemostatic attention was then turned to the endometrial implants  in the anterior cul-de-sac. There were approximately seven implants noted.  These were cauterized with the bipolar cautery and the posterior cul-de-sac.  There were two additional implants noted and these were cauterized as well.  At this point the pelvis was very carefully inspected and any areas of  bleeding were cauterized with bipolar cautery and the pelvis was then  thoroughly irrigated with the Nezhat.   At  this point attention was then turned to the patient's appendix.  It was  retrocecal but there was no obvious involvement with endometriosis.  The  patient's liver was inspected and appeared normal.  The gallbladder was  difficult to visualize secondary to a large amount of intra-abdominal  adipose tissue.  At this point, chromopertubation was performed by injecting  approximately 20 mL of dilute methylene blue.  Both fallopian tubes appeared  patent and methylene blue was visualized coming from both fimbria.  A piece  of Surgicel was then introduced into the pelvis and this was wrapped around  the right ovary to decrease the chance of future adhesion formation.  At  this point the procedure was terminated.  The 5 mm ports were removed under  direct visualization.  Future gas was allowed to escape.  The camera was  removed and the Hasson trocar sleeve removed as well.  The Hasson trocar had  been held in place by a pursestring Vicryl suture in the fascia.  This was  tied to close the fascial incision and the skin incisions were then closed  with 4-0 Vicryl in the subcuticular fashion.  The acorn cannula was  then removed.  The speculum was removed and the Foley catheter was removed.  The patient was then taken out of the dorsal lithotomy position, was  awakened from anesthesia and was then taken to the  recovery room awake and  in stable condition.  There were no complications.      Richardean Sale, M.D.  Electronically Signed     JW/MEDQ  D:  06/29/2005  T:  06/30/2005  Job:  [id number]

## 2010-09-24 NOTE — Op Note (Signed)
NAMECIELLE, AGUILA               ACCOUNT NO.:  [id number]   MEDICAL RECORD NO.:  [id number]          PATIENT TYPE:  AMB   LOCATION:  DSC                          FACILITY:  [facility]   PHYSICIAN:  Cindee Salt, M.D.       DATE OF BIRTH:  August 20, 1970   DATE OF PROCEDURE:  12/01/2004  DATE OF DISCHARGE:                                 OPERATIVE REPORT   PREOPERATIVE DIAGNOSIS:  Dorsal wrist ganglion, right wrist.   POSTOPERATIVE DIAGNOSIS:  Dorsal wrist ganglion, right wrist.   OPERATION:  Excision dorsal wrist ganglion, right wrist.   SURGEON:  Cindee Salt, M.D.   ASSISTANT:  Carolyne Fiscal R.N.   ANESTHESIA:  General.   HISTORY:  The patient is a 40 year old female with a history of a dorsal  wrist ganglion, not responsive to conservative treatment. She has elected to  have this removed surgically.   PROCEDURE:  The patient is brought to the operating room where a general  anesthetic was carried out without difficulty. She was prepped using  DuraPrep, supine position, right arm free. The limb was exsanguinated with  an Esmarch bandage, tourniquet placed on the arm was inflated to 250 mmHg. A  transverse incision was made over the mass carried down through subcutaneous  tissue. Bleeders were electrocauterized.  Dorsal nerves identified and  protected. The dissection carried down to the retinaculum and this was  split. A large sessile polypoid cyst was immediately encountered with blunt  and sharp dissection and this was dissected free, found to have eroded  through a large portion of the dorsal capsule. This area was protected. The  joint opened and the cyst was removed in toto and sent to pathology. This  rose from the scapholunate ligament. This area was minimally debrided, the  wound irrigated. The capsule closed with figure-of-eight 4-0 Vicryl sutures.  The subcutaneous tissue was closed with interrupted 4-0 Vicryl and skin with  subcuticular 4-0 Monocryl suture. Steri-Strips applied.  Compressive dressing  and splint was applied. The patient tolerated the procedure well and was  taken to the recovery observation in satisfactory condition. She is  discharged home to return to the [facility] [place] in one week on  Vicodin.       GK/MEDQ  D:  12/01/2004  T:  12/01/2004  Job:  [id number]

## 2014-01-01 ENCOUNTER — Telehealth: Payer: Self-pay | Admitting: Internal Medicine

## 2014-01-01 ENCOUNTER — Emergency Department (HOSPITAL_COMMUNITY)
Admission: EM | Admit: 2014-01-01 | Discharge: 2014-01-01 | Disposition: A | Discharge status: Home / Self Care | Payer: Self-pay | Attending: Emergency Medicine | Admitting: Emergency Medicine

## 2014-01-01 ENCOUNTER — Encounter (HOSPITAL_COMMUNITY): Payer: Self-pay | Admitting: Emergency Medicine

## 2014-01-01 DIAGNOSIS — F411 Generalized anxiety disorder: Secondary | ICD-10-CM | POA: Insufficient documentation

## 2014-01-01 DIAGNOSIS — F3289 Other specified depressive episodes: Secondary | ICD-10-CM | POA: Insufficient documentation

## 2014-01-01 DIAGNOSIS — Z8742 Personal history of other diseases of the female genital tract: Secondary | ICD-10-CM | POA: Insufficient documentation

## 2014-01-01 DIAGNOSIS — F32A Depression, unspecified: Secondary | ICD-10-CM

## 2014-01-01 DIAGNOSIS — F329 Major depressive disorder, single episode, unspecified: Secondary | ICD-10-CM | POA: Insufficient documentation

## 2014-01-01 DIAGNOSIS — F419 Anxiety disorder, unspecified: Secondary | ICD-10-CM

## 2014-01-01 HISTORY — DX: Anxiety disorder, unspecified: F41.9

## 2014-01-01 HISTORY — DX: Endometriosis, unspecified: N80.9

## 2014-01-01 MED ORDER — HYDROXYZINE HCL 25 MG PO TABS: 25.0000 mg | ORAL_TABLET | Freq: Three times a day (TID) | ORAL | Status: DC | PRN

## 2014-01-01 NOTE — ED Notes (Signed)
Pt states that she has been under a lot of stress and recently separated from her husband and lost her house. Pt states that he has hx of anxiety and feels some pressure in her chest. Pt states that this has happened in the past. NAD noted. Pt tearful.

## 2014-01-01 NOTE — Telephone Encounter (Signed)
Ok with me 

## 2014-01-01 NOTE — Discharge Instructions (Signed)
Take Vistaril as needed for anxiety, use resources below to find outpatient care for further management of your health.    Depression Depression refers to feeling sad, low, down in the dumps, blue, gloomy, or empty. In general, there are two kinds of depression: 1. Normal sadness or normal grief. This kind of depression is one that we all feel from time to time after upsetting life experiences, such as the loss of a job or the ending of a relationship. This kind of depression is considered normal, is short lived, and resolves within a few days to 2 weeks. Depression experienced after the loss of a loved one (bereavement) often lasts longer than 2 weeks but normally gets better with time. 2. Clinical depression. This kind of depression lasts longer than normal sadness or normal grief or interferes with your ability to function at home, at work, and in school. It also interferes with your personal relationships. It affects almost every aspect of your life. Clinical depression is an illness. Symptoms of depression can also be caused by conditions other than those mentioned above, such as:  Physical illness. Some physical illnesses, including underactive thyroid gland (hypothyroidism), severe anemia, specific types of cancer, diabetes, uncontrolled seizures, heart and lung problems, strokes, and chronic pain are commonly associated with symptoms of depression.  Side effects of some prescription medicine. In some people, certain types of medicine can cause symptoms of depression.  Substance abuse. Abuse of alcohol and illicit drugs can cause symptoms of depression. SYMPTOMS Symptoms of normal sadness and normal grief include the following:  Feeling sad or crying for short periods of time.  Not caring about anything (apathy).  Difficulty sleeping or sleeping too much.  No longer able to enjoy the things you used to enjoy.  Desire to be by oneself all the time (social isolation).  Lack of energy  or motivation.  Difficulty concentrating or remembering.  Change in appetite or weight.  Restlessness or agitation. Symptoms of clinical depression include the same symptoms of normal sadness or normal grief and also the following symptoms:  Feeling sad or crying all the time.  Feelings of guilt or worthlessness.  Feelings of hopelessness or helplessness.  Thoughts of suicide or the desire to harm yourself (suicidal ideation).  Loss of touch with reality (psychotic symptoms). Seeing or hearing things that are not real (hallucinations) or having false beliefs about your life or the people around you (delusions and paranoia). DIAGNOSIS  The diagnosis of clinical depression is usually based on how bad the symptoms are and how long they have lasted. Your health care provider will also ask you questions about your medical history and substance use to find out if physical illness, use of prescription medicine, or substance abuse is causing your depression. Your health care provider may also order blood tests. TREATMENT  Often, normal sadness and normal grief do not require treatment. However, sometimes antidepressant medicine is given for bereavement to ease the depressive symptoms until they resolve. The treatment for clinical depression depends on how bad the symptoms are but often includes antidepressant medicine, counseling with a mental health professional, or both. Your health care provider will help to determine what treatment is best for you. Depression caused by physical illness usually goes away with appropriate medical treatment of the illness. If prescription medicine is causing depression, talk with your health care provider about stopping the medicine, decreasing the dose, or changing to another medicine. Depression caused by the abuse of alcohol or illicit drugs goes away  when you stop using these substances. Some adults need professional help in order to stop drinking or using  drugs. SEEK IMMEDIATE MEDICAL CARE IF:  You have thoughts about hurting yourself or others.  You lose touch with reality (have psychotic symptoms).  You are taking medicine for depression and have a serious side effect. FOR MORE INFORMATION  National Alliance on Mental Illness: www.nami.[employer] of Mental Health: https://carter.com/ Document Released: 04/22/2000 Document Revised: 09/09/2013 Document Reviewed: 07/25/2011 [facility] Patient Information 2015 [place], [place]. This information is not intended to replace advice given to you by your health care provider. Make sure you discuss any questions you have with your health care provider.   Emergency Department Resource Guide 1) Find a Doctor and Pay Out of Pocket Although you won't have to find out who is covered by your insurance plan, it is a good idea to ask around and get recommendations. You will then need to call the office and see if the doctor you have chosen will accept you as a new patient and what types of options they offer for patients who are self-pay. Some doctors offer discounts or will set up payment plans for their patients who do not have insurance, but you will need to ask so you aren't surprised when you get to your appointment.  2) Contact Your Local Health Department Not all health departments have doctors that can see patients for sick visits, but many do, so it is worth a call to see if yours does. If you don't know where your local health department is, you can check in your phone book. The CDC also has a tool to help you locate your state's health department, and many state websites also have listings of all of their local health departments.  3) Find a [facility] If your illness is not likely to be very severe or complicated, you may want to try a walk in clinic. These are popping up all over the country in pharmacies, drugstores, and shopping centers. They're usually staffed by nurse  practitioners or physician assistants that have been trained to treat common illnesses and complaints. They're usually fairly quick and inexpensive. However, if you have serious medical issues or chronic medical problems, these are probably not your best option.  No Primary Care Doctor: - Call Health Connect at  [phone] - they can help you locate a primary care doctor that  accepts your insurance, provides certain services, etc. - Physician Referral Service- [phone]  Chronic Pain Problems: Organization         Address  Phone   Notes  [facility]  [phone] Patients need to be referred by their primary care doctor.   Medication Assistance: Organization         Address  Phone   Notes  [facility] Medication [facility] [address] [place], [place] [id number] [phone] --Must be a resident of [facility] -- Must have NO insurance coverage whatsoever (no Medicaid/ Medicare, etc.) -- The pt. MUST have a primary care doctor that directs their care regularly and follows them in the community   MedAssist  [phone]   [employer]  [phone]    Agencies that provide inexpensive medical care: Organization         Address  Phone   Notes  [place]  [phone]   [facility]    [phone]   [facility]  [place], [place] [phone]   [place] [address], [place] [phone]   Planned Parenthood    [phone]   [facility]    [phone]   [place] and [place] [address], [place] Phone:  [phone], Fax:  [phone] Hours of Operation:  9 am - 6 pm, M-F.  Also accepts Medicaid/Medicare and self-pay.  [facility] for [place] [address], [address], [place] Phone: [phone], Fax: [phone]. Hours of Operation:  8:30 am -  5:30 pm, M-F.  Also accepts Medicaid and self-pay.  [facility] [place] [address], [place] Phone: [phone]   [place], [place], [place] [phone], Ext. [phone] Mondays & Thursdays: 7-9 AM.  First 15 patients are seen on a first come, first serve basis.    [place] Providers:  Organization         Address  Phone   Notes  [facility] [address], [place] [phone] Also accepts self-pay patients.  [facility] [address], [place]  [phone]   [place], [address], [place] [phone]   [facility] [address], [place] [phone]   Lucianne Lei [address], [place]   [phone] Only accepts [place] Access [place] patients after they have their name applied to their card.   Self-Pay (no insurance) in [facility]:  Organization         Address  Phone   Notes  Sickle Cell Patients, [facility] [place] [phone]   [facility] [place] [phone]   [facility] [place]  [place], [place],  [phone]   [facility]/Dr. Osei-Bonsu  [address], [place] or [address], [place] [phone] Phone number for both [place] and [place] locations is the same.  [facility] [address], [place] [phone]   [facility] [address] [place] or [address] [phone]   [facility] [address] [place] [phone], phone; [phone], fax Sees patients 1st and 3rd Saturday of every month.  Must not qualify for public or private insurance (i.e. Medicaid, Medicare, Junior Health Choice,  Veterans' Benefits)  Household income should be no more than 200% of the poverty level The clinic cannot treat you if you are pregnant or think you are pregnant  Sexually transmitted diseases are not treated at the clinic.    Dental Care: Organization         Address  Phone  Notes  [facility] [place] [phone] Accepts children up to age 78 who are enrolled in [place] or [place]; pregnant women with a Medicaid card; and children who have applied for Medicaid or [place] Health Choice, but were declined, whose parents can pay a reduced fee at time of service.  [facility] of [facility]  [address], [place] [phone] Accepts children up to age 97 who are enrolled in [place]  or [place] Health Choice; pregnant women with a Medicaid card; and children who have applied for Medicaid or [place] Health Choice, but were declined, whose parents can pay a reduced fee at time of service.  [place] Adult [facility] Access PROGRAM  [place] [phone] Patients are seen by appointment only. Walk-ins are not accepted. [place] will see patients 109 years of age and older. Monday - Tuesday (8am-5pm) Most Wednesdays (8:30-5pm) $30 per visit, cash only  [facility] Adult Dental Access PROGRAM  [address], [facility] [phone] Patients are seen by appointment only. Walk-ins are not accepted. [place] will see patients 36 years of age and older. One Wednesday Evening (Monthly: Volunteer Based).  $30 per visit, cash only  [place]  [phone] for adults; Children under age 73, call Graduate Pediatric Dentistry at [phone]. Children aged 70-14, please call [phone] to request a pediatric application.  Dental services are provided in all areas of dental care including fillings, crowns and bridges, complete and  partial dentures, implants, gum treatment, root canals, and extractions. Preventive care is also provided. Treatment is provided to both adults and children. Patients are selected via a lottery and there is often a waiting list.   [facility] [address] [place]  [phone] [email or web address]   [facility] [address] [place], [place] [phone], Ext. [phone] Second and Fourth Thursday of each month, opens at 6:30 AM; Clinic ends at 9 AM.  Patients are seen on a first-come first-served basis, and a limited number are seen during each clinic.   [facility]  [address] [place], [place] [phone]   Eligibility Requirements You must have lived in [place], [place], or [place] counties for at least the last three months.   You cannot be eligible for state or federal sponsored [employer], including [employer], [place], or [employer].   You generally cannot be eligible for healthcare insurance through your employer.    How to apply: Eligibility screenings are held every Tuesday and Wednesday afternoon from 1:00 pm until 4:00 pm. You do not need an appointment for the interview!  [facility] [address], [place], [place]   [place]  [place] Department  [place]  [phone]    Behavioral Health Resources in the Community: Intensive Outpatient Programs Organization         Address  Phone  Notes  [place]. [address] [place], [place] [phone]   [facility] [address], [place], [place]   ADS: Alcohol & Drug Svcs [address], [place], [place]   [place] [address],  [place], [place] or [phone]   Substance Abuse Resources Organization          Address  Phone  Notes  Alcohol and Drug Services  [phone]   [place]  [phone]   [facility]   [place]inita Pester  [phone]   Residential & Outpatient Substance Abuse Program  [phone]   Psychological Services Organization         Address  Phone  Notes  [facility]  [place]  [phone]   [place] [address] [place] or [phone]  Mobile Crisis Teams Organization         Address  Phone  Notes  [facility], Mobile Crisis Care Unit  [phone]   [facility]  [address] [place], [place]   [place] [address], [place] [phone]    Self-Help/Support Groups Organization         Address  Phone             Notes  [facility]. of [place] - variety of support groups  [place] Call for more information  Narcotics Anonymous (NA), [facility] [address], [employer] [place]  2 meetings at this location   Special educational needs teacher         Address  Phone  Notes  [facility]AP Residential Treatment [place],    [place]  [phone]   [facility]  [address] [place] [phone], [place], [place]   [place], [place] [phone] Admissions: 8am-3pm M-F  Incentives Substance [place] [address]    [place], [place] [phone]   [facility] [address] [place], [place], [place]   [facility] [address]  [place], [place]   [facility] - Intensive Outpatient [address], [place], [place]   [facility] ([place].,  [place], [place] [phone] or [phone]   [facility] Services (RTS) [address], [place], [place] Accepts Medicaid  [facility] [place] [address],  [place] [phone] Substance Abuse/Addiction Treatment   [facility] Organization         Address  Phone  Notes  [facility]  [phone]   Domenic Schwab, PhD [address] [place], [place]   [phone] or [phone]   [place], [place] [phone]   [facility] [address], [place], [place] [phone] Insurance/Medicaid/sponsorship through [facility] and Families [address] [place]                                    [place], [place] [phone] [place] [address], [place] [phone]    Dr. Adele Schilder  [phone]   [facility] [place] Dept. 1) [address], [place] 2) [place] 3)  [address], [place] [phone]  [phone]   [place] [phone] or [phone] (After Hours)

## 2014-01-01 NOTE — Progress Notes (Addendum)
CARE MANAGEMENT ED NOTE 01/01/2014  Patient:  [name], [name]   Account Number:  [id number]  Date Initiated:  01/01/2014  Documentation initiated by:  Edwyna Shell  Subjective/Objective Assessment:   43 yo female presenting to the ED with anxiety related to life concerns     Subjective/Objective Assessment Detail:     Action/Plan:   Patient provided resources to follow up with [facility] for mental health care and [facility] for PCP and orange card   Action/Plan Detail:   Anticipated DC Date:       Status Recommendation to Physician:   Result of Recommendation:  Agreed    [place]  CM consult  Medication Assistance  Other  PCP issues    Choice offered to / List presented to:  C-1 Patient          Status of service:  Completed, signed off  ED Comments:   ED Comments Detail:  This CM spoke with the patient and she verbalized that she has recently gone through a "rough time" and she has no health insurance and hasn't seen her PCP in 3 years and he states she will have to establish herself as a new patient and they are not accepting any at this time. This CM was consulted for medication assistance program. This CM explained the [facility] program to the patient and she requested other options. This CM then discussed the GoodRx coupon system and provided the patient with a GoodRx card and a print out coupon for her specific ED prescription and explained that the coupon is good at a [employer] and will lower the cost to $8.00. This CM explained that she can access the [employer] online for discounts on prescriptions. This Cm then contacted the [facility] and spoke with Regino Schultze and tried to establish a PCP appointment for the patient and he stated that they are only making appointments for 5 days out and right now there is no availability. He stated to have the patient call on Monday because he does have appointments at the end of next week but he cannot access them today. This Cm provided the  patient with a pamphlet for the [facility] and advised that she walk in tomorrow by 8:30am to apply for the orange card and then make an appointment with a PCP. The patient verbalized concern for more urgent care for her mental health needs. This CM also provided the patient with information on March and encouraged her to go directly to [facility] today upon discharge to go through the intake process and get evaluated by a psychiatrist to get linked to medication management and a counselor. explained that [facility] Sessions works with the uninsured. This CM also provided contact information so if the patient has any difficulty connecting to resources she can call for further assistance. The patient verbalized understanding and had no other questions or concerns. This CM updated the patient RN, Lexine Baton, with information provided.   10:35, this CM received phone call from the [facility] and discussed patient situation. They do not have any open PCP appointments at this time but do have availability with the CSW at 9:00am, Friday the 28th. This Cm then contacted the patient and discussed the availability of the [facility] CSW and an appointment this Friday. Explained resources that the CSW and discuss and assist patient with. Also encouraged the patient to make an appointment with a PCP and financial counselor at that time. Patient appreciative of follow up and verbalized understanding of information provided. Edwyna Shell, RN, BSN, Case Manager  01/01/2014 10:40 AM

## 2014-01-01 NOTE — Telephone Encounter (Signed)
Patient wants to come back in to re-establish care so that she can be seen for her anxiety. She has not been seen since 2001. Would be a new pt. Please advise if patient can re-establish.

## 2014-01-01 NOTE — ED Provider Notes (Signed)
CSN: [id number]     Arrival date & time 01/01/14  0805 History   First MD Initiated Contact with Patient 01/01/14 [phone]     Chief Complaint  Patient presents with  . Depression     (Consider location/radiation/quality/duration/timing/severity/associated sxs/prior Treatment) HPI  43 year old female with history of anxiety who presents for evaluation of anxiety and depression. Patient states she has been going through a lot of stress within the past 6 months. Her house is in foreclosure, the dogs are impounded, She is going through a divorce, she is estranged from her family and she also recent ending a new relationship.  She feels overwhelmed, can't sleep at night, having crying spells, feeling anxious worrying about finance, having chest pain from these anxiety bouts.  She used to be on Xanax antianxiety medication as well as Wellbutrin for depression but have been without medication for several years due to having no insurance.  She admits to be depressed, denies SI/HI/hallucination. She does not self medicate with alcohol or street drugs. She does not have any significant cardiac history, she is a nonsmoker with no family history of premature cardiac death. She is here requesting help with her anxiety.   Past Medical History  Diagnosis Date  . Anxiety   . Endometriosis    No past surgical history on file. No family history on file. History  Substance Use Topics  . Smoking status: Never Smoker   . Smokeless tobacco: Not on file  . Alcohol Use: Yes     Comment: occassionally   OB History   Grav Para Term Preterm Abortions TAB SAB Ect Mult Living                 Review of Systems  All other systems reviewed and are negative.     Allergies  Doxycycline; Escitalopram oxalate; Moxifloxacin; and Sulfonamide derivatives  Home Medications   Prior to Admission medications   Not on File   BP 136/91  Pulse 76  Temp(Src) 98.5 F (36.9 C) (Oral)  Resp 22  SpO2 96%  LMP  11/09/2013 Physical Exam  Nursing note and vitals reviewed. Constitutional: She is oriented to person, place, and time. She appears well-developed and well-nourished. No distress.  Moderately obese Caucasian female who appears to be tearful, and depressed.  HENT:  Head: Atraumatic.  Eyes: Conjunctivae are normal.  Neck: Neck supple.  Cardiovascular: Normal rate, regular rhythm and intact distal pulses.   Pulmonary/Chest: Effort normal and breath sounds normal. She exhibits no tenderness.  Abdominal: Soft.  Neurological: She is alert and oriented to person, place, and time.  Skin: No rash noted.  Psychiatric: She has a normal mood and affect.    ED Course  Procedures (including critical care time)  8:57 AM Patient with a significant history of cardiac disease is here for anxiety and depression. The patient did mention chest pain, and this is atypical for ACS.  Her EKG shows isolated ST depression in lead III but no other changes in contiguous leads.  My plan is to provide patient with hydroxyzine for her depression. I will also provide outpatient resources for further care. I did offer for a psychiatric evaluation, however patient declined at this time.  Patient also requests access to [facility] program to help her with her medication, I will consult with case management.    9:05 AM Case manager has been consult, she will see the patient and we'll attempt to find outpatient care along with medication coverage.  Labs Review Labs Reviewed -  No data to display  Imaging Review No results found.   EKG Interpretation   Date/Time:  Wednesday January 01 2014 08:30:09 EDT Ventricular Rate:  77 PR Interval:  133 QRS Duration: 86 QT Interval:  380 QTC Calculation: 430 R Axis:   87 Text Interpretation:  Sinus rhythm No previous ECGs available Confirmed by  YAO  MD, DAVID ([id number]) on 01/01/2014 8:38:15 AM      MDM   Final diagnoses:  Anxiety and depression    BP 136/91  Pulse 76   Temp(Src) 98.5 F (36.9 C) (Oral)  Resp 22  SpO2 96%  LMP 11/09/2013       Domenic Moras, PA-C 01/01/14 [phone]

## 2014-01-01 NOTE — Telephone Encounter (Signed)
Pt scheduled  

## 2014-01-02 ENCOUNTER — Encounter: Payer: Self-pay | Admitting: Internal Medicine

## 2014-01-02 ENCOUNTER — Ambulatory Visit (INDEPENDENT_AMBULATORY_CARE_PROVIDER_SITE_OTHER): Payer: Self-pay | Admitting: Internal Medicine

## 2014-01-02 VITALS — BP 126/84 | HR 90 | Temp 97.9°F | Wt 287.6 lb

## 2014-01-02 DIAGNOSIS — G43909 Migraine, unspecified, not intractable, without status migrainosus: Secondary | ICD-10-CM

## 2014-01-02 DIAGNOSIS — R21 Rash and other nonspecific skin eruption: Secondary | ICD-10-CM

## 2014-01-02 DIAGNOSIS — F3289 Other specified depressive episodes: Secondary | ICD-10-CM

## 2014-01-02 DIAGNOSIS — M545 Low back pain, unspecified: Secondary | ICD-10-CM

## 2014-01-02 DIAGNOSIS — Z23 Encounter for immunization: Secondary | ICD-10-CM

## 2014-01-02 DIAGNOSIS — G4700 Insomnia, unspecified: Secondary | ICD-10-CM

## 2014-01-02 DIAGNOSIS — F329 Major depressive disorder, single episode, unspecified: Secondary | ICD-10-CM

## 2014-01-02 DIAGNOSIS — F411 Generalized anxiety disorder: Secondary | ICD-10-CM

## 2014-01-02 DIAGNOSIS — R609 Edema, unspecified: Secondary | ICD-10-CM

## 2014-01-02 MED ORDER — TRIAMCINOLONE ACETONIDE 0.1 % EX CREA: 1.0000 "application " | TOPICAL_CREAM | Freq: Two times a day (BID) | CUTANEOUS | Status: DC

## 2014-01-02 MED ORDER — CITALOPRAM HYDROBROMIDE 40 MG PO TABS: 40.0000 mg | ORAL_TABLET | Freq: Every day | ORAL | Status: DC

## 2014-01-02 MED ORDER — AMITRIPTYLINE HCL 50 MG PO TABS: 50.0000 mg | ORAL_TABLET | Freq: Every evening | ORAL | Status: DC | PRN

## 2014-01-02 MED ORDER — CYCLOBENZAPRINE HCL 5 MG PO TABS: 5.0000 mg | ORAL_TABLET | Freq: Three times a day (TID) | ORAL | Status: DC | PRN

## 2014-01-02 MED ORDER — HYDROCHLOROTHIAZIDE 25 MG PO TABS: ORAL_TABLET | ORAL | Status: DC

## 2014-01-02 MED ORDER — ALPRAZOLAM 1 MG PO TABS: ORAL_TABLET | ORAL | Status: DC

## 2014-01-02 MED ORDER — SUMATRIPTAN SUCCINATE 100 MG PO TABS: 100.0000 mg | ORAL_TABLET | ORAL | Status: DC | PRN

## 2014-01-02 MED ORDER — NAPROXEN 500 MG PO TABS: 500.0000 mg | ORAL_TABLET | Freq: Two times a day (BID) | ORAL | Status: DC

## 2014-01-02 NOTE — Progress Notes (Signed)
Pre visit review using our clinic review tool, if applicable. No additional management support is needed unless otherwise documented below in the visit note. 

## 2014-01-02 NOTE — ED Provider Notes (Signed)
Medical screening examination/treatment/procedure(s) were performed by non-physician practitioner and as supervising physician I was immediately available for consultation/collaboration.   EKG Interpretation   Date/Time:  Wednesday January 01 2014 08:30:09 EDT Ventricular Rate:  77 PR Interval:  133 QRS Duration: 86 QT Interval:  380 QTC Calculation: 430 R Axis:   87 Text Interpretation:  Sinus rhythm No previous ECGs available Confirmed by  YAO  MD, DAVID ([id number]) on 01/01/2014 8:38:15 AM        Wandra Arthurs, MD 01/02/14 [phone]

## 2014-01-02 NOTE — Patient Instructions (Signed)
Please take all new medication as prescribed - the citalopram, amytriptilene for sleep, steroid cream for rash, antiflammatory for pain, flexeril for muscle relaxer, and HCT (fluid pill) as needed for swelling, imitrex for migraine, and xanax as needed  Please start the citalopram at HALF pill for 1 wk, then whole pill after that, to make sure no stomach upset  Please consider using the prescription for the compression stockings, leg elevation when you are sitting if possible, low salt diet, and weight loss to help the leg swelling  Please return in 3 months, or sooner if needed

## 2014-01-02 NOTE — Progress Notes (Signed)
   Subjective:    Patient ID: [name], female    DOB: 11-Oct-1970, 43 y.o.   MRN: [id number]  HPI here to re-establish , no insurance now, working full time but no benefits yet, trying to get on permanent, lots of financial stress, family relations difficult, now separated from husbnd.  Had worsening depressive symptoms, no suicidal ideation, or panic; but has ongoing anxiety,with increased recently.  No longer taking meds for psych, last seen here approx 4 yrs, used to be on lexapro, and wellbutrin, and thinks xanax prn as well.   Seen in ER yesterday with panic attack.  Sleep very difficult to get to sleep, stay asleep. Also with eczematous changes to fingers and right arm.   Pt continues to have recurring LBP without change in severity, bowel or bladder change, fever, wt loss,  worsening LE pain/numbness/weakness, gait change or falls. Past Medical History  Diagnosis Date  . Anxiety   . Endometriosis    No past surgical history on file.  reports that she has never smoked. She does not have any smokeless tobacco history on file. She reports that she drinks alcohol. Her drug history is not on file. family history is not on file. Allergies  Allergen Reactions  . Doxycycline     REACTION: gi upset  . Escitalopram Oxalate     REACTION: fatigue  . Moxifloxacin     REACTION: needs epinephrine shot--anaphylaxis  . Sulfonamide Derivatives     REACTION: needs epinephrine shot--rash and lip swelling   No current outpatient prescriptions on file prior to visit.   No current facility-administered medications on file prior to visit.    Review of Systems VS noted,  BP 126/84  Pulse 90  Temp(Src) 97.9 F (36.6 C) (Oral)  Wt 287 lb 9 oz (130.437 kg)  SpO2 96%  LMP 11/09/2013 Constitutional: Pt appears well-developed, well-nourished.  HENT: Head: NCAT.  Right Ear: External ear normal.  Left Ear: External ear normal.  Eyes: . Pupils are equal, round, and reactive to light. Conjunctivae  and EOM are normal Neck: Normal range of motion. Neck supple.  Cardiovascular: Normal rate and regular rhythm.   Pulmonary/Chest: Effort normal and breath sounds normal.  Neurological: Pt is alert. Not confused , motor grossly intact Skin: Skin is warm. No rash Psychiatric: Pt behavior is normal. No agitation. tearful, depressed affect, 2-3+ nervous All otherwise neg per pt     Objective:   Physical Exam BP 126/84  Pulse 90  Temp(Src) 97.9 F (36.6 C) (Oral)  Wt 287 lb 9 oz (130.437 kg)  SpO2 96%  LMP 11/09/2013 VS noted,  Constitutional: Pt appears well-developed, well-nourished.  HENT: Head: NCAT.  Right Ear: External ear normal.  Left Ear: External ear normal.  Eyes: . Pupils are equal, round, and reactive to light. Conjunctivae and EOM are normal Neck: Normal range of motion. Neck supple.  Cardiovascular: Normal rate and regular rhythm.   Pulmonary/Chest: Effort normal and breath sounds normal.  Abd:  Soft, NT, ND, + BS Neurological: Pt is alert. Not confused , motor grossly intact Spine nontender Skin: Skin is warm. No rash except quite a bit of cracking to fingertips Psychiatric: Pt behavior is normal. No agitation. + depressed affect, 1+ nervous LLE trace edema    Assessment & Plan:

## 2014-01-03 ENCOUNTER — Other Ambulatory Visit: Payer: Self-pay

## 2014-01-08 DIAGNOSIS — M545 Low back pain, unspecified: Secondary | ICD-10-CM | POA: Insufficient documentation

## 2014-01-08 NOTE — Assessment & Plan Note (Signed)
For xanax prn asd

## 2014-01-08 NOTE — Assessment & Plan Note (Signed)
For triam cr prn,  to f/u any worsening symptoms or concerns 

## 2014-01-08 NOTE — Assessment & Plan Note (Signed)
[place] for res-tart hct 12.5 qd prn

## 2014-01-08 NOTE — Assessment & Plan Note (Signed)
Ok for restart citalpram 40 qd,  to f/u any worsening symptoms or concerns

## 2014-01-08 NOTE — Assessment & Plan Note (Signed)
Ok for imitrex prn 

## 2014-01-08 NOTE — Assessment & Plan Note (Signed)
Mild to mod, no neuro change, c/w prob msk strain, for nsaid prn, flexeril prn,  to f/u any worsening symptoms or concerns

## 2014-01-08 NOTE — Assessment & Plan Note (Signed)
For elavil 50 qhs prn

## 2014-02-14 ENCOUNTER — Telehealth: Payer: Self-pay | Admitting: Internal Medicine

## 2014-02-14 MED ORDER — NAPROXEN 500 MG PO TABS: 500.0000 mg | ORAL_TABLET | Freq: Two times a day (BID) | ORAL | Status: DC

## 2014-02-14 NOTE — Telephone Encounter (Signed)
Unfort, we have nothing else to offer at this time, except for tylenol OTC as needed

## 2014-02-14 NOTE — Telephone Encounter (Signed)
Called the patient and she already has refills on naproxen, but she wants something additional for the pain.  States Tramadol was given in the past and worked well, but needs something safe to take while working.

## 2014-02-14 NOTE — Telephone Encounter (Signed)
Called the patient informed of MD instructions. 

## 2014-02-14 NOTE — Telephone Encounter (Signed)
Done erx 

## 2014-02-14 NOTE — Telephone Encounter (Signed)
Patient need a prescription for naproxen 500 mg and need something for pain but without drowsiness. Patient has no insurance.([facility] [address] ) please give her a call. Thank you

## 2014-03-28 ENCOUNTER — Ambulatory Visit: Payer: Self-pay | Admitting: Internal Medicine

## 2014-04-11 ENCOUNTER — Ambulatory Visit: Payer: Self-pay | Admitting: Internal Medicine

## 2014-06-23 ENCOUNTER — Ambulatory Visit (INDEPENDENT_AMBULATORY_CARE_PROVIDER_SITE_OTHER): Payer: BLUE CROSS/BLUE SHIELD | Admitting: Nurse Practitioner

## 2014-06-23 ENCOUNTER — Telehealth: Payer: Self-pay

## 2014-06-23 VITALS — BP 150/88 | HR 98 | Temp 98.4°F | Ht 68.0 in | Wt 298.5 lb

## 2014-06-23 DIAGNOSIS — B9789 Other viral agents as the cause of diseases classified elsewhere: Principal | ICD-10-CM

## 2014-06-23 DIAGNOSIS — J069 Acute upper respiratory infection, unspecified: Secondary | ICD-10-CM | POA: Insufficient documentation

## 2014-06-23 NOTE — Patient Instructions (Signed)
You have a virus causing your symptoms. The average duration of cold symptoms is 14 days.  For nasal congestion: Start daily sinus rinses (neilmed Sinus Rinse). Vicks vapor rub under nose to help breathe. Start Affrin. Use as directed for 3 days. Stop for 24 hours, then use again for 3 days. Stop using dayquil. For sore throat: Benzocaine throat lozenges for sore throat. For cough: Use guaifenesen 600 mg twice daily if you are coughing up mucous.Tylenol or ibuprophen for headache. Sip fluids every hour. Rest. If you are not feeling better in 10 days or develop fever or chest pain, call us for re-evaluation. Feel better!  Upper Respiratory Infection, Adult An upper respiratory infection (URI) is also sometimes known as the common cold. The upper respiratory tract includes the nose, sinuses, throat, trachea, and bronchi. Bronchi are the airways leading to the lungs. Most people improve within 1 week, but symptoms can last up to 2 weeks. A residual cough may last even longer.  CAUSES Many different viruses can infect the tissues lining the upper respiratory tract. The tissues become irritated and inflamed and often become very moist. Mucus production is also common. A cold is contagious. You can easily spread the virus to others by oral contact. This includes kissing, sharing a glass, coughing, or sneezing. Touching your mouth or nose and then touching a surface, which is then touched by another person, can also spread the virus. SYMPTOMS  Symptoms typically develop 1 to 3 days after you come in contact with a cold virus. Symptoms vary from person to person. They may include:  Runny nose.  Sneezing.  Nasal congestion.  Sinus irritation.  Sore throat.  Loss of voice (laryngitis).  Cough.  Fatigue.  Muscle aches.  Loss of appetite.  Headache.  Low-grade fever. DIAGNOSIS  You might diagnose your own cold based on familiar symptoms, since most people get a cold 2 to 3 times a year.  Your caregiver can confirm this based on your exam. Most importantly, your caregiver can check that your symptoms are not due to another disease such as strep throat, sinusitis, pneumonia, asthma, or epiglottitis. Blood tests, throat tests, and X-rays are not necessary to diagnose a common cold, but they may sometimes be helpful in excluding other more serious diseases. Your caregiver will decide if any further tests are required. RISKS AND COMPLICATIONS  You may be at risk for a more severe case of the common cold if you smoke cigarettes, have chronic heart disease (such as heart failure) or lung disease (such as asthma), or if you have a weakened immune system. The very young and very old are also at risk for more serious infections. Bacterial sinusitis, middle ear infections, and bacterial pneumonia can complicate the common cold. The common cold can worsen asthma and chronic obstructive pulmonary disease (COPD). Sometimes, these complications can require emergency medical care and may be life-threatening. PREVENTION  The best way to protect against getting a cold is to practice good hygiene. Avoid oral or hand contact with people with cold symptoms. Wash your hands often if contact occurs. There is no clear evidence that vitamin C, vitamin E, echinacea, or exercise reduces the chance of developing a cold. However, it is always recommended to get plenty of rest and practice good nutrition. TREATMENT  Treatment is directed at relieving symptoms. There is no cure. Antibiotics are not effective, because the infection is caused by a virus, not by bacteria. Treatment may include:  Increased fluid intake. Sports drinks offer valuable  electrolytes, sugars, and fluids.  Breathing heated mist or steam (vaporizer or shower).  Eating chicken soup or other clear broths, and maintaining good nutrition.  Getting plenty of rest.  Using gargles or lozenges for comfort.  Controlling fevers with ibuprofen or  acetaminophen as directed by your caregiver.  Increasing usage of your inhaler if you have asthma. Zinc gel and zinc lozenges, taken in the first 24 hours of the common cold, can shorten the duration and lessen the severity of symptoms. Pain medicines may help with fever, muscle aches, and throat pain. A variety of non-prescription medicines are available to treat congestion and runny nose. Your caregiver can make recommendations and may suggest nasal or lung inhalers for other symptoms.  HOME CARE INSTRUCTIONS   Only take over-the-counter or prescription medicines for pain, discomfort, or fever as directed by your caregiver.  Use a warm mist humidifier or inhale steam from a shower to increase air moisture. This may keep secretions moist and make it easier to breathe.  Drink enough water and fluids to keep your urine clear or pale yellow.  Rest as needed.  Return to work when your temperature has returned to normal or as your caregiver advises. You may need to stay home longer to avoid infecting others. You can also use a face mask and careful hand washing to prevent spread of the virus. SEEK MEDICAL CARE IF:   After the first few days, you feel you are getting worse rather than better.  You need your caregiver's advice about medicines to control symptoms.  You develop chills, worsening shortness of breath, or brown or red sputum. These may be signs of pneumonia.  You develop yellow or brown nasal discharge or pain in the face, especially when you bend forward. These may be signs of sinusitis.  You develop a fever, swollen neck glands, pain with swallowing, or white areas in the back of your throat. These may be signs of strep throat. SEEK IMMEDIATE MEDICAL CARE IF:   You have a fever.  You develop severe or persistent headache, ear pain, sinus pain, or chest pain.  You develop wheezing, a prolonged cough, cough up blood, or have a change in your usual mucus (if you have chronic lung  disease).  You develop sore muscles or a stiff neck. Document Released: 10/19/2000 Document Revised: 07/18/2011 Document Reviewed: 08/27/2010 [facility] Patient Information 2014 [place], [place].

## 2014-06-23 NOTE — Progress Notes (Signed)
   Subjective:    Patient ID: [name], female    DOB: 12-Aug-1970, 44 y.o.   MRN: [id number]  URI  This is a new problem. The current episode started in the past 7 days (1 wk). The problem has been gradually worsening. There has been no fever. Associated symptoms include chest pain (tight), congestion, coughing, headaches, a plugged ear sensation, sinus pain, a sore throat and swollen glands. Pertinent negatives include no abdominal pain, diarrhea, nausea, neck pain or wheezing. She has tried decongestant (dayquil) for the symptoms. The treatment provided no relief (feeling lightheaded today after taking OTC med).      Review of Systems  Constitutional: Positive for fatigue. Negative for fever.  HENT: Positive for congestion, postnasal drip and sore throat.   Respiratory: Positive for cough and chest tightness. Negative for wheezing.   Cardiovascular: Positive for chest pain (tight).  Gastrointestinal: Negative for nausea, abdominal pain and diarrhea.  Musculoskeletal: Negative for neck pain.  Neurological: Positive for headaches.       Objective:   Physical Exam  Constitutional: She is oriented to person, place, and time. She appears well-developed and well-nourished. No distress.  HENT:  Head: Normocephalic and atraumatic.  Right Ear: External ear normal.  Left Ear: External ear normal.  Mouth/Throat: Oropharynx is clear and moist. No oropharyngeal exudate.  bilat TM retraction  Eyes: Conjunctivae are normal. Right eye exhibits no discharge. Left eye exhibits no discharge.  Neck: Normal range of motion. Neck supple. No thyromegaly present.  Cardiovascular: Normal rate, regular rhythm and normal heart sounds.   No murmur heard. Pulmonary/Chest: Effort normal and breath sounds normal. No respiratory distress. She has no wheezes. She has no rales.  Lymphadenopathy:    She has no cervical adenopathy.  Neurological: She is alert and oriented to person, place, and time.  Skin:  Skin is warm and dry.  Psychiatric: She has a normal mood and affect. Her behavior is normal. Thought content normal.  Vitals reviewed.         Assessment & Plan:   1. Viral upper respiratory tract infection with cough Symptom management Start sinus rinse Stop dayguil Increase fluids Use affrin for 3 days. F/u PRN

## 2014-06-23 NOTE — Progress Notes (Signed)
Pre visit review using our clinic review tool, if applicable. No additional management support is needed unless otherwise documented below in the visit note. 

## 2014-06-23 NOTE — Telephone Encounter (Signed)
Pt is in the office today for a sinus injection sick visit.  Pt is interested in trying the voltaren gel.   Please advise if this can be done.  Thanks

## 2014-06-24 ENCOUNTER — Ambulatory Visit (INDEPENDENT_AMBULATORY_CARE_PROVIDER_SITE_OTHER): Payer: BLUE CROSS/BLUE SHIELD | Admitting: Internal Medicine

## 2014-06-24 ENCOUNTER — Encounter: Payer: Self-pay | Admitting: Internal Medicine

## 2014-06-24 VITALS — BP 120/88 | HR 64 | Temp 98.6°F | Wt 297.0 lb

## 2014-06-24 DIAGNOSIS — J019 Acute sinusitis, unspecified: Secondary | ICD-10-CM

## 2014-06-24 DIAGNOSIS — J0110 Acute frontal sinusitis, unspecified: Secondary | ICD-10-CM | POA: Insufficient documentation

## 2014-06-24 DIAGNOSIS — F411 Generalized anxiety disorder: Secondary | ICD-10-CM

## 2014-06-24 DIAGNOSIS — R0600 Dyspnea, unspecified: Secondary | ICD-10-CM

## 2014-06-24 DIAGNOSIS — M25561 Pain in right knee: Secondary | ICD-10-CM

## 2014-06-24 MED ORDER — METHYLPREDNISOLONE ACETATE 80 MG/ML IJ SUSP
80.0000 mg | Freq: Once | INTRAMUSCULAR | Status: AC
  Administered 2014-06-24: 80 mg via INTRAMUSCULAR

## 2014-06-24 MED ORDER — DICLOFENAC SODIUM 1 % TD GEL: TRANSDERMAL | Status: DC

## 2014-06-24 MED ORDER — HYDROCODONE-HOMATROPINE 5-1.5 MG/5ML PO SYRP: 5.0000 mL | ORAL_SOLUTION | Freq: Four times a day (QID) | ORAL | Status: DC | PRN

## 2014-06-24 MED ORDER — LEVOFLOXACIN 500 MG PO TABS: 500.0000 mg | ORAL_TABLET | Freq: Every day | ORAL | Status: DC

## 2014-06-24 MED ORDER — ALBUTEROL SULFATE HFA 108 (90 BASE) MCG/ACT IN AERS: 2.0000 | INHALATION_SPRAY | Freq: Four times a day (QID) | RESPIRATORY_TRACT | Status: DC | PRN

## 2014-06-24 MED ORDER — ALPRAZOLAM 1 MG PO TABS: ORAL_TABLET | ORAL | Status: DC

## 2014-06-24 NOTE — Progress Notes (Signed)
Subjective:    Patient ID: [name], female    DOB: 11-10-1970, 44 y.o.   MRN: [id number]  HPI   Here with 2-3 days acute onset fever, worsening facial pain since yesterday, pressure, headache, general weakness and malaise, and greenish d/c, with mild ST and cough, but pt denies chest pain, wheezing, increased sob or doe, orthopnea, PND, increased LE swelling, palpitations, dizziness or syncope, except for onset mild wheeze., sob since last pm.  OTC such as nyquil not working.  Also with 6 mo gradually worsening acute on chronic right knee pain, tried a friends Educational psychologist, worked well, asks for tx.  Denies worsening depressive symptoms, suicidal ideation, or panic; has ongoing anxiety, not increased recently., asks for xanax refill. Past Medical History  Diagnosis Date  . Anxiety   . Endometriosis    No past surgical history on file.  reports that she has never smoked. She does not have any smokeless tobacco history on file. She reports that she drinks alcohol. Her drug history is not on file. family history is not on file. Allergies  Allergen Reactions  . Doxycycline     REACTION: gi upset  . Escitalopram Oxalate     REACTION: fatigue  . Moxifloxacin     REACTION: needs epinephrine shot--anaphylaxis  . Sulfonamide Derivatives     REACTION: needs epinephrine shot--rash and lip swelling   Current Outpatient Prescriptions on File Prior to Visit  Medication Sig Dispense Refill  . amitriptyline (ELAVIL) 50 MG tablet Take 1 tablet (50 mg total) by mouth at bedtime as needed for sleep. 90 tablet 1  . citalopram (CELEXA) 40 MG tablet Take 1 tablet (40 mg total) by mouth daily. 90 tablet 3  . cyclobenzaprine (FLEXERIL) 5 MG tablet Take 1 tablet (5 mg total) by mouth 3 (three) times daily as needed for muscle spasms. 60 tablet 1  . hydrochlorothiazide (HYDRODIURIL) 25 MG tablet 1/2 tab by mouth per day as needed for swelling 45 tablet 3  . naproxen (NAPROSYN) 500 MG tablet Take 1 tablet (500  mg total) by mouth 2 (two) times daily with a meal. As needed for pain 60 tablet 2  . Pseudoeph-Doxylamine-DM-APAP (DAYQUIL/NYQUIL COLD/FLU RELIEF PO) Take by mouth.    . SUMAtriptan (IMITREX) 100 MG tablet Take 1 tablet (100 mg total) by mouth every 2 (two) hours as needed for migraine or headache. May repeat in 2 hours if headache persists or recurs. 10 tablet 5  . triamcinolone cream (KENALOG) 0.1 % Apply 1 application topically 2 (two) times daily. 30 g 0   No current facility-administered medications on file prior to visit.   Review of Systems  Constitutional: Negative for unusual diaphoresis or other sweats  HENT: Negative for ringing in ear Eyes: Negative for double vision or worsening visual disturbance.  Respiratory: Negative for choking and stridor.   Gastrointestinal: Negative for vomiting or other signifcant bowel change Genitourinary: Negative for hematuria or decreased urine volume.  Musculoskeletal: Negative for other MSK pain or swelling Skin: Negative for color change and worsening wound.  Neurological: Negative for tremors and numbness other than noted  Psychiatric/Behavioral: Negative for decreased concentration or agitation other than above       Objective:   Physical Exam BP 120/88 mmHg  Pulse 64  Temp(Src) 98.6 F (37 C) (Oral)  Wt 297 lb (134.718 kg) VS noted,  Constitutional: Pt appears well-developed, well-nourished.  HENT: Head: NCAT.  Right Ear: External ear normal.  Left Ear: External ear normal.  Bilat tm's  with mild erythema.  Max sinus areas mild tender.  Pharynx with mild erythema, no exudate Eyes: . Pupils are equal, round, and reactive to light. Conjunctivae and EOM are normal Neck: Normal range of motion. Neck supple.  Cardiovascular: Normal rate and regular rhythm.   Pulmonary/Chest: Effort normal and breath sounds decreased bilat without rales or wheezing.  Neurological: Pt is alert. Not confused , motor grossly intact Skin: Skin is warm. No  rash Psychiatric: Pt behavior is normal. No agitation. mild nervous Right knee with FROM, NT, no effusion but crepitus and pain with flexion       Assessment & Plan:

## 2014-06-24 NOTE — Progress Notes (Signed)
Pre visit review using our clinic review tool, if applicable. No additional management support is needed unless otherwise documented below in the visit note. 

## 2014-06-24 NOTE — Patient Instructions (Signed)
You had the steroid shot today  Please take all new medication as prescribed - the antibiotic, cough medicine, and inhaler as needed; as well as the voltaren gel  Please continue all other medications as before, and refills have been done if requested - xanax  Please have the pharmacy call with any other refills you may need.  Please keep your appointments with your specialists as you may have planned

## 2014-06-24 NOTE — Assessment & Plan Note (Signed)
stable overall by history and exam, recent data reviewed with pt, and pt to continue medical treatment as before,  to f/u any worsening symptoms or concerns Lab Results  Component Value Date   WBC 11.3* 04/06/2009   HGB 13.7 04/06/2009   HCT 40.7 04/06/2009   PLT 400 04/06/2009   GLUCOSE 85 04/06/2009   ALT 30 04/06/2009   AST 27 04/06/2009   NA 138 04/06/2009   K 3.6 04/06/2009   CL 104 04/06/2009   CREATININE 0.77 04/06/2009   BUN 7 04/06/2009   CO2 27 04/06/2009   For refill xanax pnr

## 2014-06-24 NOTE — Telephone Encounter (Signed)
To consider office visit, as I dont see on her chart a prior diagnosis that would need this , such as knee or other specific joint pain

## 2014-06-24 NOTE — Assessment & Plan Note (Signed)
Suspect some early bronchospasm, for depomedrol IM x 1 today, proair inhaler prn,  to f/u any worsening symptoms or concerns SpO2 Readings from Last 3 Encounters:  06/23/14 97%  01/02/14 96%  01/01/14 100%

## 2014-06-24 NOTE — Telephone Encounter (Signed)
Left message on cell phone to call back and schedule OV

## 2014-06-24 NOTE — Assessment & Plan Note (Signed)
Mild to mod, for antibx course,  to f/u any worsening symptoms or concerns 

## 2014-06-24 NOTE — Assessment & Plan Note (Signed)
Likely djd, possibly aggrevated by wt - for wt loss, voltaren gel prn

## 2014-06-25 ENCOUNTER — Telehealth: Payer: Self-pay | Admitting: Internal Medicine

## 2014-06-25 NOTE — Telephone Encounter (Signed)
Pt stated that drug store was trying to get in touch with Dr. [name] concern about antibiotic that Dr. [name] gave might interact with Celexa. Please advise, pt havent heard anything

## 2014-06-25 NOTE — Telephone Encounter (Signed)
Spoke with pharmacist  

## 2014-06-25 NOTE — Telephone Encounter (Signed)
I thought that was addressed, ok for rachel to let pharmacy know, ok for antibiotic with other med at this time

## 2014-09-10 ENCOUNTER — Ambulatory Visit (INDEPENDENT_AMBULATORY_CARE_PROVIDER_SITE_OTHER): Payer: BLUE CROSS/BLUE SHIELD | Admitting: Internal Medicine

## 2014-09-10 ENCOUNTER — Encounter: Payer: Self-pay | Admitting: Internal Medicine

## 2014-09-10 VITALS — BP 130/78 | HR 81 | Temp 99.5°F | Resp 20 | Ht 68.0 in | Wt 294.1 lb

## 2014-09-10 DIAGNOSIS — Z0189 Encounter for other specified special examinations: Secondary | ICD-10-CM | POA: Diagnosis not present

## 2014-09-10 DIAGNOSIS — Z0000 Encounter for general adult medical examination without abnormal findings: Secondary | ICD-10-CM

## 2014-09-10 DIAGNOSIS — E039 Hypothyroidism, unspecified: Secondary | ICD-10-CM | POA: Diagnosis not present

## 2014-09-10 DIAGNOSIS — Z0001 Encounter for general adult medical examination with abnormal findings: Secondary | ICD-10-CM | POA: Insufficient documentation

## 2014-09-10 DIAGNOSIS — G4710 Hypersomnia, unspecified: Secondary | ICD-10-CM | POA: Insufficient documentation

## 2014-09-10 DIAGNOSIS — G4700 Insomnia, unspecified: Secondary | ICD-10-CM | POA: Diagnosis not present

## 2014-09-10 MED ORDER — LEVOTHYROXINE SODIUM 25 MCG PO TABS: 25.0000 ug | ORAL_TABLET | Freq: Every day | ORAL | Status: DC

## 2014-09-10 MED ORDER — ESZOPICLONE 2 MG PO TABS: 2.0000 mg | ORAL_TABLET | Freq: Every evening | ORAL | Status: DC | PRN

## 2014-09-10 NOTE — Assessment & Plan Note (Addendum)
Overall doing well, age appropriate education and counseling updated, referrals for preventative services and immunizations addressed, dietary and smoking counseling addressed, most recent labs reviewed.  I have personally reviewed and have noted:  1) the patient's medical and social history 2) The pt's use of alcohol, tobacco, and illicit drugs 3) The patient's current medications and supplements 4) Functional ability including ADL's, fall risk, home safety risk, hearing and visual impairment 5) Diet and physical activities 6) Evidence for depression or mood disorder 7) The patient's height, weight, and BMI have been recorded in the chart  I have made referrals, and provided counseling and education based on review of the above [facility] labs she brings with her reviewed with pt today

## 2014-09-10 NOTE — Progress Notes (Signed)
Subjective:    Patient ID: [name], female    DOB: 11/01/1970, 44 y.o.   MRN: [id number]  HPI  Here for wellness and f/u;  Overall doing ok;  Pt denies Chest pain, worsening SOB, DOE, wheezing, orthopnea, PND, worsening LE edema, palpitations, dizziness or syncope.  Pt denies neurological change such as new headache, facial or extremity weakness.  Pt denies polydipsia, polyuria, or low sugar symptoms. Pt states overall good compliance with treatment and medications, good tolerability, and has been trying to follow appropriate diet.  Pt denies worsening depressive symptoms, suicidal ideation or panic. No fever, night sweats, wt loss, loss of appetite, or other constitutional symptoms.  Pt states good ability with ADL's, has low fall risk, home safety reviewed and adequate, no other significant changes in hearing or vision, and only occasionally active with exercise.  TSH with labs at work is midlly elevated over 9, new for her.  Hard to lose wt.  Going through divorce now. Snores at night, Does c/o ongoing fatigue, but also has  signficant daytime hypersomnolence.  Also with significant hangover with elavil and ambien, cannto take either for sleep Past Medical History  Diagnosis Date  . Anxiety   . Endometriosis    No past surgical history on file.  reports that she has never smoked. She does not have any smokeless tobacco history on file. She reports that she drinks alcohol. Her drug history is not on file. family history is not on file. Allergies  Allergen Reactions  . Doxycycline     REACTION: gi upset  . Escitalopram Oxalate     REACTION: fatigue  . Moxifloxacin     REACTION: needs epinephrine shot--anaphylaxis  . Sulfonamide Derivatives     REACTION: needs epinephrine shot--rash and lip swelling   Current Outpatient Prescriptions on File Prior to Visit  Medication Sig Dispense Refill  . albuterol (PROVENTIL HFA;VENTOLIN HFA) 108 (90 BASE) MCG/ACT inhaler Inhale 2 puffs into the  lungs every 6 (six) hours as needed for wheezing or shortness of breath. 1 Inhaler 2  . ALPRAZolam (XANAX) 1 MG tablet 1/2 tab by mouth twice per day as needed 30 tablet 5  . citalopram (CELEXA) 40 MG tablet Take 1 tablet (40 mg total) by mouth daily. 90 tablet 3  . cyclobenzaprine (FLEXERIL) 5 MG tablet Take 1 tablet (5 mg total) by mouth 3 (three) times daily as needed for muscle spasms. 60 tablet 1  . diclofenac sodium (VOLTAREN) 1 % GEL Use topically four times per day as needed for pain 100 g 11  . hydrochlorothiazide (HYDRODIURIL) 25 MG tablet 1/2 tab by mouth per day as needed for swelling 45 tablet 3  . HYDROcodone-homatropine (HYCODAN) 5-1.5 MG/5ML syrup Take 5 mLs by mouth every 6 (six) hours as needed for cough. 180 mL 0  . naproxen (NAPROSYN) 500 MG tablet Take 1 tablet (500 mg total) by mouth 2 (two) times daily with a meal. As needed for pain 60 tablet 2  . Pseudoeph-Doxylamine-DM-APAP (DAYQUIL/NYQUIL COLD/FLU RELIEF PO) Take by mouth.    . SUMAtriptan (IMITREX) 100 MG tablet Take 1 tablet (100 mg total) by mouth every 2 (two) hours as needed for migraine or headache. May repeat in 2 hours if headache persists or recurs. 10 tablet 5  . triamcinolone cream (KENALOG) 0.1 % Apply 1 application topically 2 (two) times daily. 30 g 0   No current facility-administered medications on file prior to visit.    Review of Systems Constitutional: Negative for increased  diaphoresis, other activity, appetite or siginficant weight change other than noted HENT: Negative for worsening hearing loss, ear pain, facial swelling, mouth sores and neck stiffness.   Eyes: Negative for other worsening pain, redness or visual disturbance.  Respiratory: Negative for shortness of breath and wheezing  Cardiovascular: Negative for chest pain and palpitations.  Gastrointestinal: Negative for diarrhea, blood in stool, abdominal distention or other pain Genitourinary: Negative for hematuria, flank pain or change in  urine volume.  Musculoskeletal: Negative for myalgias or other joint complaints.  Skin: Negative for color change and wound or drainage.  Neurological: Negative for syncope and numbness. other than noted Hematological: Negative for adenopathy. or other swelling Psychiatric/Behavioral: Negative for hallucinations, SI, self-injury, decreased concentration or other worsening agitation.      Objective:   Physical Exam BP 130/78 mmHg  Pulse 81  Temp(Src) 99.5 F (37.5 C) (Oral)  Resp 20  Ht 5\' 8"  (1.727 m)  Wt 294 lb 1.3 oz (133.394 kg)  BMI 44.73 kg/m2  SpO2 98% VS noted,  Constitutional: Pt is oriented to person, place, and time. Appears well-developed and well-nourished, in no significant distress Head: Normocephalic and atraumatic.  Right Ear: External ear normal.  Left Ear: External ear normal.  Nose: Nose normal.  Mouth/Throat: Oropharynx is clear and moist.  Eyes: Conjunctivae and EOM are normal. Pupils are equal, round, and reactive to light.  Neck: Normal range of motion. Neck supple. No JVD present. No tracheal deviation present or significant neck LA or mass Cardiovascular: Normal rate, regular rhythm, normal heart sounds and intact distal pulses.   Pulmonary/Chest: Effort normal and breath sounds without rales or wheezing  Abdominal: Soft. Bowel sounds are normal. NT. No HSM  Musculoskeletal: Normal range of motion. Exhibits no edema.  Lymphadenopathy:  Has no cervical adenopathy.  Neurological: Pt is alert and oriented to person, place, and time. Pt has normal reflexes. No cranial nerve deficit. Motor grossly intact Skin: Skin is warm and dry. No rash noted.  Psychiatric:  Has normal mood and affect. Behavior is normal.     Assessment & Plan:

## 2014-09-10 NOTE — Assessment & Plan Note (Signed)
New onset, start low dose levothyroxine, f/u lab 4 wks

## 2014-09-10 NOTE — Assessment & Plan Note (Signed)
Ok for trial lunesta 2mg  prn

## 2014-09-10 NOTE — Assessment & Plan Note (Signed)
Suspect likely OSA - for refer pulm

## 2014-09-10 NOTE — Patient Instructions (Signed)
Please take all new medication as prescribed  - the thyroid medication, and the Lunesta  Please return in 4 wks to the LAB in the Basement (turn left off the elevator) for the tests to be done for thyroid again   Please continue all other medications as before, and refills have been done if requested.  Please have the pharmacy call with any other refills you may need.  Please continue your efforts at being more active, low cholesterol diet, and weight control.  You are otherwise up to date with prevention measures today.  You will be contacted regarding the referral for: Pulmonary for possible sleep apnea  Please keep your appointments with your specialists as you may have planned  Please return in 1 year for your yearly visit, or sooner if needed

## 2014-09-22 ENCOUNTER — Encounter: Payer: Self-pay | Admitting: Internal Medicine

## 2014-09-24 ENCOUNTER — Encounter: Payer: Self-pay | Admitting: Internal Medicine

## 2014-11-09 ENCOUNTER — Emergency Department (HOSPITAL_COMMUNITY)
Admission: EM | Admit: 2014-11-09 | Discharge: 2014-11-09 | Disposition: A | Discharge status: Other Acute Inpatient Hospital | Payer: BLUE CROSS/BLUE SHIELD | Attending: Emergency Medicine | Admitting: Emergency Medicine

## 2014-11-09 ENCOUNTER — Emergency Department (HOSPITAL_COMMUNITY): Payer: BLUE CROSS/BLUE SHIELD

## 2014-11-09 ENCOUNTER — Encounter (HOSPITAL_COMMUNITY): Payer: Self-pay | Admitting: Emergency Medicine

## 2014-11-09 DIAGNOSIS — R109 Unspecified abdominal pain: Secondary | ICD-10-CM | POA: Diagnosis present

## 2014-11-09 DIAGNOSIS — R200 Anesthesia of skin: Secondary | ICD-10-CM | POA: Diagnosis not present

## 2014-11-09 DIAGNOSIS — F419 Anxiety disorder, unspecified: Secondary | ICD-10-CM | POA: Insufficient documentation

## 2014-11-09 DIAGNOSIS — Z3202 Encounter for pregnancy test, result negative: Secondary | ICD-10-CM | POA: Insufficient documentation

## 2014-11-09 DIAGNOSIS — C569 Malignant neoplasm of unspecified ovary: Secondary | ICD-10-CM | POA: Diagnosis not present

## 2014-11-09 DIAGNOSIS — R0602 Shortness of breath: Secondary | ICD-10-CM | POA: Insufficient documentation

## 2014-11-09 DIAGNOSIS — Z8742 Personal history of other diseases of the female genital tract: Secondary | ICD-10-CM | POA: Insufficient documentation

## 2014-11-09 DIAGNOSIS — Z79899 Other long term (current) drug therapy: Secondary | ICD-10-CM | POA: Insufficient documentation

## 2014-11-09 DIAGNOSIS — R1011 Right upper quadrant pain: Secondary | ICD-10-CM

## 2014-11-09 DIAGNOSIS — Z7952 Long term (current) use of systemic steroids: Secondary | ICD-10-CM | POA: Diagnosis not present

## 2014-11-09 DIAGNOSIS — Z791 Long term (current) use of non-steroidal anti-inflammatories (NSAID): Secondary | ICD-10-CM | POA: Diagnosis not present

## 2014-11-09 LAB — URINALYSIS, ROUTINE W REFLEX MICROSCOPIC
Bilirubin Urine: NEGATIVE
Glucose, UA: NEGATIVE mg/dL
HGB URINE DIPSTICK: NEGATIVE
KETONES UR: NEGATIVE mg/dL
LEUKOCYTES UA: NEGATIVE
Nitrite: NEGATIVE
Protein, ur: NEGATIVE mg/dL
Specific Gravity, Urine: 1.012 (ref 1.005–1.030)
Urobilinogen, UA: 1 mg/dL (ref 0.0–1.0)
pH: 8 (ref 5.0–8.0)

## 2014-11-09 LAB — CBC
HCT: 44.1 % (ref 36.0–46.0)
HEMOGLOBIN: 14.9 g/dL (ref 12.0–15.0)
MCH: 30.7 pg (ref 26.0–34.0)
MCHC: 33.8 g/dL (ref 30.0–36.0)
MCV: 90.9 fL (ref 78.0–100.0)
Platelets: 321 10*3/uL (ref 150–400)
RBC: 4.85 MIL/uL (ref 3.87–5.11)
RDW: 12.7 % (ref 11.5–15.5)
WBC: 22.7 10*3/uL — ABNORMAL HIGH (ref 4.0–10.5)

## 2014-11-09 LAB — HEPATIC FUNCTION PANEL
ALT: 17 U/L (ref 14–54)
AST: 19 U/L (ref 15–41)
Albumin: 4.2 g/dL (ref 3.5–5.0)
Alkaline Phosphatase: 54 U/L (ref 38–126)
BILIRUBIN INDIRECT: 0.5 mg/dL (ref 0.3–0.9)
Bilirubin, Direct: 0.2 mg/dL (ref 0.1–0.5)
Total Bilirubin: 0.7 mg/dL (ref 0.3–1.2)
Total Protein: 7.2 g/dL (ref 6.5–8.1)

## 2014-11-09 LAB — BASIC METABOLIC PANEL
Anion gap: 8 (ref 5–15)
BUN: 6 mg/dL (ref 6–20)
CO2: 27 mmol/L (ref 22–32)
CREATININE: 0.77 mg/dL (ref 0.44–1.00)
Calcium: 10 mg/dL (ref 8.9–10.3)
Chloride: 103 mmol/L (ref 101–111)
GFR calc Af Amer: >60 mL/min (ref >60)
GLUCOSE: 104 mg/dL — AB (ref 65–99)
Potassium: 3.8 mmol/L (ref 3.5–5.1)
SODIUM: 138 mmol/L (ref 135–145)

## 2014-11-09 LAB — LIPASE, BLOOD: Lipase: 27 U/L (ref 22–51)

## 2014-11-09 LAB — BRAIN NATRIURETIC PEPTIDE: B NATRIURETIC PEPTIDE 5: 29.1 pg/mL (ref 0.0–100.0)

## 2014-11-09 LAB — I-STAT TROPONIN, ED: TROPONIN I, POC: 0.03 ng/mL (ref 0.00–0.08)

## 2014-11-09 LAB — POC URINE PREG, ED: Preg Test, Ur: NEGATIVE

## 2014-11-09 MED ORDER — ONDANSETRON HCL 4 MG/2ML IJ SOLN
4.0000 mg | Freq: Once | INTRAMUSCULAR | Status: AC
  Administered 2014-11-09: 4 mg via INTRAVENOUS

## 2014-11-09 MED ORDER — ONDANSETRON HCL 4 MG/2ML IJ SOLN
4.0000 mg | Freq: Once | INTRAMUSCULAR | Status: AC
  Administered 2014-11-09: 4 mg via INTRAVENOUS
  Filled 2014-11-09: qty 2

## 2014-11-09 MED ORDER — SODIUM CHLORIDE 0.9 % IV BOLUS (SEPSIS)
500.0000 mL | Freq: Once | INTRAVENOUS | Status: AC
  Administered 2014-11-09: 500 mL via INTRAVENOUS

## 2014-11-09 MED ORDER — HYDROMORPHONE HCL 1 MG/ML IJ SOLN
1.0000 mg | Freq: Once | INTRAMUSCULAR | Status: AC
  Administered 2014-11-09: 1 mg via INTRAVENOUS
  Filled 2014-11-09: qty 1

## 2014-11-09 MED ORDER — IOHEXOL 300 MG/ML  SOLN
25.0000 mL | Freq: Once | INTRAMUSCULAR | Status: AC | PRN
  Administered 2014-11-09: 25 mL via ORAL

## 2014-11-09 MED ORDER — IOHEXOL 300 MG/ML  SOLN
100.0000 mL | Freq: Once | INTRAMUSCULAR | Status: AC | PRN
  Administered 2014-11-09: 100 mL via INTRAVENOUS

## 2014-11-09 NOTE — ED Notes (Signed)
[facility] here to transport patient. 

## 2014-11-09 NOTE — ED Provider Notes (Signed)
CSN: [id number]     Arrival date & time 11/09/14  1433 History   First MD Initiated Contact with Patient 11/09/14 1549     Chief Complaint  Patient presents with  . Abdominal Pain  . Shortness of Breath  . Numbness      HPI Pt. Crying stated, I started having right abdominal pain and its gone to both my shoulders making me  SOB and some numbness. My stomach feels like bloated, the pain takes my breath away. Past Medical History  Diagnosis Date  . Anxiety   . Endometriosis    History reviewed. No pertinent past surgical history. No family history on file. History  Substance Use Topics  . Smoking status: Never Smoker   . Smokeless tobacco: Not on file  . Alcohol Use: Yes     Comment: occassionally   OB History    No data available     Review of Systems  All other systems reviewed and are negative.     Allergies  Moxifloxacin; Doxycycline; Escitalopram oxalate; and Sulfonamide derivatives  Home Medications   Prior to Admission medications   Medication Sig Start Date End Date Taking? Authorizing Provider  albuterol (PROVENTIL HFA;VENTOLIN HFA) 108 (90 BASE) MCG/ACT inhaler Inhale 2 puffs into the lungs every 6 (six) hours as needed for wheezing or shortness of breath. Patient taking differently: Inhale 2 puffs into the lungs every 6 (six) hours as needed (excercise induced asthma).  06/24/14  Yes Biagio Borg, MD  ALPRAZolam Duanne Moron) 1 MG tablet 1/2 tab by mouth twice per day as needed Patient taking differently: Take 1 mg by mouth daily as needed for anxiety. 1/2 tab by mouth twice per day as nee 06/24/14  Yes Biagio Borg, MD  aspirin-acetaminophen-caffeine [facility] MIGRAINE) [phone] MG per tablet Take 2 tablets by mouth 2 (two) times daily as needed for headache or migraine.   Yes Historical Provider, MD  citalopram (CELEXA) 40 MG tablet Take 1 tablet (40 mg total) by mouth daily. 01/02/14  Yes Biagio Borg, MD  Cyanocobalamin (VITAMIN B-12 PO) Take 2 tablets by mouth  daily.   Yes Historical Provider, MD  diclofenac sodium (VOLTAREN) 1 % GEL Use topically four times per day as needed for pain Patient taking differently: Apply 1 application topically 2 (two) times daily as needed (knee pain).  06/24/14  Yes Biagio Borg, MD  ibuprofen (ADVIL,MOTRIN) 200 MG tablet Take 400-800 mg by mouth 2 (two) times daily as needed (knee pain).   Yes Historical Provider, MD  levothyroxine (LEVOTHROID) 25 MCG tablet Take 1 tablet (25 mcg total) by mouth daily before breakfast. 09/10/14  Yes Biagio Borg, MD  naproxen (NAPROSYN) 500 MG tablet Take 1 tablet (500 mg total) by mouth 2 (two) times daily with a meal. As needed for pain Patient taking differently: Take 500 mg by mouth 2 (two) times daily as needed (arthritis pain).  02/14/14  Yes Biagio Borg, MD  SUMAtriptan (IMITREX) 100 MG tablet Take 1 tablet (100 mg total) by mouth every 2 (two) hours as needed for migraine or headache. May repeat in 2 hours if headache persists or recurs. 01/02/14  Yes Biagio Borg, MD  triamcinolone cream (KENALOG) 0.1 % Apply 1 application topically 2 (two) times daily. Patient taking differently: Apply 1 application topically daily at 12 noon. For psoriasis 01/02/14  Yes Biagio Borg, MD  cyclobenzaprine (FLEXERIL) 5 MG tablet Take 1 tablet (5 mg total) by mouth 3 (three) times daily  as needed for muscle spasms. Patient not taking: Reported on 11/09/2014 01/02/14   Biagio Borg, MD  eszopiclone (LUNESTA) 2 MG TABS tablet Take 1 tablet (2 mg total) by mouth at bedtime as needed for sleep. Take immediately before bedtime Patient not taking: Reported on 11/09/2014 09/10/14   Biagio Borg, MD  hydrochlorothiazide (HYDRODIURIL) 25 MG tablet 1/2 tab by mouth per day as needed for swelling Patient not taking: Reported on 11/09/2014 01/02/14   Biagio Borg, MD   BP 122/61 mmHg  Pulse 74  Temp(Src) 99 F (37.2 C) (Oral)  Resp 13  Ht 5\' 8"  (1.727 m)  Wt 287 lb (130.182 kg)  BMI 43.65 kg/m2  SpO2 96%  LMP  11/02/2014 Physical Exam  Constitutional: She is oriented to person, place, and time. She appears well-developed and well-nourished. No distress.  HENT:  Head: Normocephalic and atraumatic.  Eyes: Pupils are equal, round, and reactive to light.  Neck: Normal range of motion.  Cardiovascular: Normal rate and intact distal pulses.   Pulmonary/Chest: No respiratory distress.  Abdominal: Normal appearance. She exhibits no distension. There is tenderness. There is guarding.    Musculoskeletal: Normal range of motion.  Neurological: She is alert and oriented to person, place, and time. No cranial nerve deficit.  Skin: Skin is warm and dry. No rash noted.  Psychiatric: She has a normal mood and affect. Her behavior is normal.  Nursing note and vitals reviewed.   ED Course  Procedures (including critical care time) Medications  HYDROmorphone (DILAUDID) injection 1 mg (1 mg Intravenous Given 11/09/14 1617)  ondansetron (ZOFRAN) injection 4 mg (4 mg Intravenous Given 11/09/14 1617)  sodium chloride 0.9 % bolus 500 mL (0 mLs Intravenous Stopped 11/09/14 1646)  HYDROmorphone (DILAUDID) injection 1 mg (1 mg Intravenous Given 11/09/14 1655)  iohexol (OMNIPAQUE) 300 MG/ML solution 25 mL (25 mLs Oral Contrast Given 11/09/14 1847)  iohexol (OMNIPAQUE) 300 MG/ML solution 100 mL (100 mLs Intravenous Contrast Given 11/09/14 1915)  HYDROmorphone (DILAUDID) injection 1 mg (1 mg Intravenous Given 11/09/14 1952)  HYDROmorphone (DILAUDID) injection 1 mg (1 mg Intravenous Given 11/09/14 2116)    Labs Review Labs Reviewed  CBC - Abnormal; Notable for the following:    WBC 22.7 (*)    All other components within normal limits  BASIC METABOLIC PANEL - Abnormal; Notable for the following:    Glucose, Bld 104 (*)    All other components within normal limits  BRAIN NATRIURETIC PEPTIDE  LIPASE, BLOOD  URINALYSIS, ROUTINE W REFLEX MICROSCOPIC (NOT AT [facility])  HEPATIC FUNCTION PANEL  POC URINE PREG, ED  I-STAT TROPOININ, ED   POC URINE PREG, ED    Imaging Review US Abdomen Complete  11/09/2014   CLINICAL DATA:  Right upper quadrant abdomen pain.  EXAM: ULTRASOUND ABDOMEN COMPLETE  COMPARISON:  None.  FINDINGS: Gallbladder: No gallstones or wall thickening visualized. No sonographic Murphy sign noted.  Common bile duct: Diameter: 3.1 mm  Liver: No focal lesion identified. Within normal limits in parenchymal echogenicity.  IVC: No abnormality visualized.  Pancreas: Visualized portion unremarkable.  Spleen: Size and appearance within normal limits.  Right Kidney: Length: 12.8 cm. Echogenicity within normal limits. No mass or hydronephrosis visualized.  Left Kidney: Length: 12.7 cm. Echogenicity within normal limits. No mass or hydronephrosis visualized.  Abdominal aorta: No aneurysm visualized.  Other findings: None.  IMPRESSION: No acute abnormality.   Electronically Signed   By: Abelardo Diesel M.D.   On: 11/09/2014 18:03   Ct Abdomen  Pelvis W Contrast  11/09/2014   CLINICAL DATA:  Right upper quadrant abdominal pain. Shortness of breath.  EXAM: CT ABDOMEN AND PELVIS WITH CONTRAST  TECHNIQUE: Multidetector CT imaging of the abdomen and pelvis was performed using the standard protocol following bolus administration of intravenous contrast.  CONTRAST:  173mL OMNIPAQUE IOHEXOL 300 MG/ML  SOLN  COMPARISON:  11/01/2008  FINDINGS: Lower chest:  Normal.  Hepatobiliary: There is a new 17 mm vague area of lucency in the posterior aspect of the dome of the right lobe of the liver. There is slight chronic hepatomegaly. Biliary tree is normal.  Pancreas: Normal.  Spleen: Normal.  Adrenals/Urinary Tract: Normal except for a tiny stone in the lower pole of the left kidney.  Stomach/Bowel: Normal.  Vascular/Lymphatic: No adenopathy.  No atherosclerotic disease.  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 The total mass measures 14 x 10 x 11 cm. The uterus is  deviated inferiorly by the large mass but otherwise appears normal.  Other: There is a small amount of ascites in the abdomen and pelvis.  Musculoskeletal: Normal.  IMPRESSION: 1. 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 Small amount of free fluid in the abdomen and pelvis. 2. Chronic hepatomegaly. There is a new vague 17 mm lesion in the dome of the right lobe which is worrisome for metastatic disease.   Electronically Signed   By: Lorriane Shire M.D.   On: 11/09/2014 19:48     EKG Interpretation   Date/Time:  Sunday November 09 2014 14:37:15 EDT Ventricular Rate:  92 PR Interval:  130 QRS Duration: 72 QT Interval:  338 QTC Calculation: 417 R Axis:   86 Text Interpretation:  Normal sinus rhythm Nonspecific ST abnormality  Abnormal QRS-T angle, consider primary T wave abnormality Abnormal ECG  Baseline wander Otherwise no significant change Confirmed by Audie Pinto  MD,  Claramae Rigdon ([id number]) on 11/09/2014 3:50:30 PM     I discussed with gynecologic oncology at [facility].  Patient be transferred to [facility] for definitive care and treatment.  Excepting physicians Dr.Van Marin Comment.  Patient been stabilized and is stable for transfer. MDM   Final diagnoses:  RUQ discomfort        Leonard Schwartz, MD 11/09/14 2228

## 2014-11-09 NOTE — ED Notes (Signed)
Pt. Crying stated, I started having right abdominal pain and its gone to both my shoulders making me  SOB and some numbness. My stomach feels like bloated, the pain takes my breath away.

## 2014-11-09 NOTE — ED Notes (Signed)
Dr. Audie Pinto in room with patient and spouse at this time.

## 2014-11-09 NOTE — ED Notes (Signed)
Dr. Audie Pinto states patient may have ice chips and gave verbal order for pain medication.

## 2014-11-09 NOTE — ED Notes (Signed)
Dr. Audie Pinto at bedside at present time.

## 2014-11-09 NOTE — ED Notes (Signed)
Dr. Audie Pinto at bedside at this time.

## 2014-11-09 NOTE — ED Notes (Signed)
Patient called out of room reporting nausea and nose itching. Zofran administered. Patient sitting on bed, tearful/crying, talking with female friend. Pain remains the same despite administration of Dilaudid.

## 2014-11-09 NOTE — ED Notes (Signed)
Dr. Matthew Saras, [place], [place] is patient's OBGYN - per patient's spouse.

## 2014-11-09 NOTE — ED Notes (Signed)
Report given to Jaclyn Shaggy, Therapist, sports at [facility]

## 2014-11-10 DIAGNOSIS — C563 Malignant neoplasm of bilateral ovaries: Secondary | ICD-10-CM | POA: Insufficient documentation

## 2014-11-10 DIAGNOSIS — N9489 Other specified conditions associated with female genital organs and menstrual cycle: Secondary | ICD-10-CM | POA: Insufficient documentation

## 2014-11-10 DIAGNOSIS — F419 Anxiety disorder, unspecified: Secondary | ICD-10-CM | POA: Insufficient documentation

## 2014-11-10 HISTORY — PX: HYSTERECTOMY ABDOMINAL WITH SALPINGO-OOPHORECTOMY: SHX6792

## 2014-11-10 HISTORY — PX: ABDOMINAL HYSTERECTOMY: SHX81

## 2014-11-11 MED FILL — Hydromorphone HCl Inj 2 MG/ML: INTRAMUSCULAR | Qty: 1 | Status: AC

## 2014-11-12 DIAGNOSIS — F17200 Nicotine dependence, unspecified, uncomplicated: Secondary | ICD-10-CM | POA: Insufficient documentation

## 2014-11-20 ENCOUNTER — Other Ambulatory Visit: Payer: Self-pay | Admitting: Oncology

## 2014-11-20 DIAGNOSIS — C562 Malignant neoplasm of left ovary: Principal | ICD-10-CM

## 2014-11-20 DIAGNOSIS — C563 Malignant neoplasm of bilateral ovaries: Secondary | ICD-10-CM

## 2014-11-20 DIAGNOSIS — C561 Malignant neoplasm of right ovary: Secondary | ICD-10-CM

## 2014-11-21 ENCOUNTER — Ambulatory Visit: Payer: BLUE CROSS/BLUE SHIELD | Attending: Gynecologic Oncology | Admitting: Gynecologic Oncology

## 2014-11-21 ENCOUNTER — Telehealth: Payer: Self-pay | Admitting: *Deleted

## 2014-11-21 ENCOUNTER — Ambulatory Visit (HOSPITAL_BASED_OUTPATIENT_CLINIC_OR_DEPARTMENT_OTHER): Payer: BLUE CROSS/BLUE SHIELD

## 2014-11-21 VITALS — BP 129/73 | HR 80 | Temp 98.1°F | Resp 20 | Ht 68.0 in | Wt 291.8 lb

## 2014-11-21 DIAGNOSIS — N3289 Other specified disorders of bladder: Secondary | ICD-10-CM | POA: Insufficient documentation

## 2014-11-21 DIAGNOSIS — Z4802 Encounter for removal of sutures: Secondary | ICD-10-CM | POA: Insufficient documentation

## 2014-11-21 DIAGNOSIS — IMO0001 Reserved for inherently not codable concepts without codable children: Secondary | ICD-10-CM

## 2014-11-21 DIAGNOSIS — T814XXA Infection following a procedure, initial encounter: Secondary | ICD-10-CM | POA: Insufficient documentation

## 2014-11-21 LAB — URINALYSIS, MICROSCOPIC - CHCC
Bilirubin (Urine): NEGATIVE
Blood: NEGATIVE
GLUCOSE UR CHCC: NEGATIVE mg/dL
Ketones: NEGATIVE mg/dL
LEUKOCYTE ESTERASE: NEGATIVE
Nitrite: NEGATIVE
PROTEIN: NEGATIVE mg/dL
Specific Gravity, Urine: 1.025 (ref 1.003–1.035)
UROBILINOGEN UR: 0.2 mg/dL (ref 0.2–1)
pH: 5 (ref 4.6–8.0)

## 2014-11-21 MED ORDER — CEPHALEXIN 500 MG PO CAPS: 500.0000 mg | ORAL_CAPSULE | Freq: Four times a day (QID) | ORAL | Status: DC

## 2014-11-21 NOTE — Telephone Encounter (Signed)
Notified pt of future appointments on 11/26/2014 and 11/28/2014. Pt agreed with time and dates

## 2014-11-21 NOTE — Progress Notes (Signed)
Follow Up Note: Gyn-Onc  [name] 44 y.o. female  CC:  Chief Complaint  Patient presents with  . Routine Post Op    f/u from [facility], staple removal    HPI:  [name] is a 44 year old female woman who presented to the [facility] Emergency department with acute onset abdominal pain, dyspnea, and bilateral ovarian masses seen on CT. She was transferred to [facility] from [facility] for further evaluation and treatment. She reported a history of endometriosis and infertility but no other gynecologic history. Her last pap smear was multiple years prior to presentation and she denied a history of dysplasia along with abnormal uterine bleeding.  Preop Labs and Imaging:  CA 125: 554  CEA: 0.9  CA 19-9: 587  CT 11/09/14: IMPRESSION: 1. Large multiloculated partially cystic partially solid mass replacing the left ovary. The right ovary is also enlarged, heterogeneous, and abnormal in appearance with the inferior margin immediately abutting this mass lesion. Despite trace fat plane along the superior aspect of the right ovary and cystic/solid mass, the right ovary is markedly abnormal in appearance and certainly involved. These findings are concerning for a primary ovarian malignancy involving both ovaries. A pelvic ultrasound has been ordered the time of examination and may be useful in further evaluation of these pelvic masses. 2. The base of the appendix is slightly dilated and demonstrates hyperenhancement of the wall. Additionally, the lateral aspect of the base the appendix immediately abuts the right side of the pelvic mass. These findings may reflect appendiceal involvement. 3. Minimal abutment of the sigmoid colon with relatively preserved fat plane throughout. No differential dilatation of the sigmoid colon or obvious tethering to suggest sigmoid involvement. However, no discrete fat plane is seen within a short segment as described above. 4. Trace ascites. No hydronephrosis or omental  caking. 5. Minimal perihepatic free fluid as well less enlarged portacaval lymph node node. 6. Subtle posterior right hepatic dome hypodensity is too small to characterize. While this lesion does not have the typical appearance of a metastasis, this finding remains indeterminate on this suboptimally timed study. If clinically indicated, a MRI of the abdomen without and with contrast may be obtained for further evaluation.  On 11/10/14, she underwent an exploratory laparotomy and radical tumor debulking with lysis of adhesions for 90 minutes and bilateral ureterolysis for retroperitoneal fibrosis by Dr. Thurston Pounds at [facility].  Operative findings included:  1. Chocolate colored ascites immediately noted upon abdominal entry consistent with ruptured endometrioma  2. Bilateral ovarian masses with chocolate fluid, left side measuring ~12cm with ~8cm solid component, right side measuring ~6cm (frozen section of the left ovary showing invasive carcinoma concerning for clear cell)  3. 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 10 cm fibroid uterus with normal cervix to palpation  5. Normal abdominal survey with normal appearing omentum, normal appearing appendix retroperitoneal and running cephalad along the cecum, normal small and large bowel without evidence of cancer spread, smooth diaphragms bilaterally, smooth liver edge with hepatomegaly, normal stomach and normal kidneys on palpation  6. Sigmoid colon densely adherent to the uterus and posterior cervix precluding cervical excision  7. No pathologically enlarged lymph nodes but discolored shotty left obturator and aortic lymph nodes excised  8. R0 resection with no gross residual cancer at surgery close.  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 (please  see synoptic report)   - Endometriosis  Left fallopian tube:  - Negative for involvement by clear cell carcinoma  B: Ovary and tube, right salpingo-oophorectomy  Right ovary:  - Ovarian clear cell carcinoma (please see synoptic report)  - Endometriosis  Right fallopian tube:  - Negative for involvement by clear cell carcinoma  - Endometriosis  C: Uterus, supracervical hysterectomy  - Negative for involvement by clear cell carcinoma  Additional findings:  Endometrium: - Weakly proliferative endometrium  - No hyperplasia or carcinoma identified  Myometrium:  - Extensive adenomyosis  D: Lymph node, aortic, regional resection  - One lymph node negative for metastatic carcinoma (0/1)  E: Lymph node, right pelvic, regional resection  - Four lymph nodes negative for metastatic carcinoma (0/4)  F: Lymph node, left pelvic, regional resection  - Two lymph nodes negative for metastatic carcinoma (0/2)  G: Omentum, omentectomy  - Negative for involvement by clear cell carcinoma  - Mature adipose tissue  H: Peritoneum, biopsy  - Negative for involvement by clear cell carcinoma  - Fibroadipose tissue  A: Pelvic washing  - No malignant cells identified (see comment)  B: Diaphragm, brushing/scraping  - No malignant cells identified  - Reactive mesothelial cells  Interval History:  The patient presents today with her significant other for post-operative follow up and staple removal.  She reports doing well since surgery.  Appetite improving with no nausea or emesis reported.  Minimal pain reported.  Ambulating without difficulty.  Bowels and bladder functioning without difficulty except for spasm-like discomfort after voiding.  Reporting mild swelling in the left leg more than the right as being present for around one year.  She denies ever having a doppler to rule out DVT.  She reports concerns about increased soreness and erythema around the lower aspect of her abdominal incision.  She reports no discharge from the  incision.  She was given a binder while in the hospital but has not been wearing it.  Today she is anxious about staple removal.  She also states she is supposed to begin chemotherapy in [facility] per Dr. Thurston Pounds.  She is to see Dr. Thurston Pounds on Tuesday, July 19.  No other concerns voiced.    Review of Systems  Constitutional: Feels well.  No fever, chills, early satiety, unintentional weight loss or gain.  Appetite improving.  Cardiovascular: No chest pain, shortness of breath, or edema.  Pulmonary: No cough or wheeze.  Gastrointestinal: No nausea, vomiting, or diarrhea. No bright red blood per rectum or change in bowel movement.  Genitourinary: No frequency, urgency. No vaginal bleeding or discharge.  Musculoskeletal: No myalgia or joint pain. Neurologic: No weakness, numbness, or change in gait.  Psychology: No depression, anxiety, or insomnia.  Current Meds:  Outpatient Encounter Prescriptions as of 11/21/2014  Medication Sig  . acetaminophen (TYLENOL) 325 MG tablet Take 650 mg by mouth as needed.   . ALPRAZolam (XANAX) 1 MG tablet 1/2 tab by mouth twice per day as needed (Patient taking differently: Take 1 mg by mouth daily as needed for anxiety. 1/2 tab by mouth twice per day as nee)  . citalopram (CELEXA) 40 MG tablet Take 1 tablet (40 mg total) by mouth daily.  . Cyanocobalamin (VITAMIN B-12 PO) Take 2 tablets by mouth daily.  . diclofenac sodium (VOLTAREN) 1 % GEL Use topically four times per day as needed for pain (Patient taking differently: Apply 1 application topically 2 (two) times daily as needed (knee pain). )  . enoxaparin (  LOVENOX) 40 MG/0.4ML injection Inject 40 mg into the skin daily.   Marland Kitchen gabapentin (NEURONTIN) 300 MG capsule Take 300 mg by mouth at bedtime.   . hydrochlorothiazide (HYDRODIURIL) 25 MG tablet 1/2 tab by mouth per day as needed for swelling  . HYDROmorphone (DILAUDID) 2 MG tablet Take 2 mg by mouth every 4 (four) hours as needed.   Marland Kitchen ibuprofen (ADVIL,MOTRIN) 200  MG tablet Take 400-800 mg by mouth 2 (two) times daily as needed (knee pain).  Marland Kitchen levothyroxine (LEVOTHROID) 25 MCG tablet Take 1 tablet (25 mcg total) by mouth daily before breakfast.  . sennosides-docusate sodium (SENOKOT-S) 8.6-50 MG tablet Take 1 tablet by mouth daily.   . simethicone (MYLICON) 80 MG chewable tablet Chew 80 mg by mouth as needed.   . SUMAtriptan (IMITREX) 100 MG tablet Take 1 tablet (100 mg total) by mouth every 2 (two) hours as needed for migraine or headache. May repeat in 2 hours if headache persists or recurs.  . triamcinolone cream (KENALOG) 0.1 % Apply 1 application topically 2 (two) times daily. (Patient taking differently: Apply 1 application topically daily at 12 noon. For psoriasis)  . albuterol (PROVENTIL HFA;VENTOLIN HFA) 108 (90 BASE) MCG/ACT inhaler Inhale 2 puffs into the lungs every 6 (six) hours as needed for wheezing or shortness of breath. (Patient not taking: Reported on 11/21/2014)  . aspirin-acetaminophen-caffeine (EXCEDRIN MIGRAINE) 250-250-65 MG per tablet Take 2 tablets by mouth 2 (two) times daily as needed for headache or migraine.  . cephALEXin (KEFLEX) 500 MG capsule Take 1 capsule (500 mg total) by mouth 4 (four) times daily.  . cyclobenzaprine (FLEXERIL) 5 MG tablet Take 1 tablet (5 mg total) by mouth 3 (three) times daily as needed for muscle spasms. (Patient not taking: Reported on 11/09/2014)  . naproxen (NAPROSYN) 500 MG tablet Take 1 tablet (500 mg total) by mouth 2 (two) times daily with a meal. As needed for pain (Patient not taking: Reported on 11/21/2014)  . [DISCONTINUED] eszopiclone (LUNESTA) 2 MG TABS tablet Take 1 tablet (2 mg total) by mouth at bedtime as needed for sleep. Take immediately before bedtime (Patient not taking: Reported on 11/09/2014)   No facility-administered encounter medications on file as of 11/21/2014.    Allergy:  Allergies  Allergen Reactions  . Moxifloxacin Anaphylaxis    needs epinephrine shot  . Quinolones  Anaphylaxis, Shortness Of Breath and Swelling  . Doxycycline Nausea And Vomiting  . Escitalopram Oxalate Other (See Comments)     fatigue  . Sulfonamide Derivatives Swelling and Rash    needs epinephrine shot--rash and lip swelling    Social Hx:   History   Social History  . Marital Status: Divorced    Spouse Name: N/A  . Number of Children: N/A  . Years of Education: N/A   Occupational History  . Not on file.   Social History Main Topics  . Smoking status: Never Smoker   . Smokeless tobacco: Not on file  . Alcohol Use: Yes     Comment: occassionally  . Drug Use: Not on file  . Sexual Activity: Not on file   Other Topics Concern  . Not on file   Social History Narrative    Past Surgical Hx:  Past Surgical History  Procedure Laterality Date  . Abdominal hysterectomy  11/10/14    at [facility], Exp lap, supracervical hyst, BSO, infracolic omentectomy, lymphadenectomy, aortic lymph node sampling    Past Medical Hx:  Past Medical History  Diagnosis Date  . Anxiety   .  Endometriosis     Family Hx:  Family History  Problem Relation Age of Onset  . Hypertension Mother   . Diabetes Mother     Vitals:  Blood pressure 129/73, pulse 80, temperature 98.1 F (36.7 C), temperature source Oral, resp. rate 20, height 5\' 8"  (1.727 m), weight 291 lb 12.8 oz (132.36 kg), last menstrual period 11/02/2014, SpO2 96 %.  Physical Exam:  General: Well developed, well nourished female in no acute distress. Alert and oriented x 3.  Cardiovascular: Regular rate and rhythm. S1 and S2 normal.  Lungs: Clear to auscultation bilaterally. No wheezes/crackles/rhonchi noted.  Skin: No rashes or lesions present. Back: No CVA tenderness.  Abdomen: Abdomen soft, non-tender and morbidly obese. Active bowel sounds in all quadrants. 41 staples removed without difficulty.  Erythema noted around the staple insertion sites on the upper abdomen with moderate erythema present surrounding the lower aspect  of the incision.  2 cm of induration noted on either side of the lower incision as well.  Increased warmth to palpation.  No drainage noted or expressed.  Tender with light palpation.  1/2 inch steri strips applied.      Extremities: Edema noted bilaterally, slightly more in the left lower extremity but non-pitting.  No erythema or tenderness noted with the LLE.  Negative Homans sign with the LLE.  No bilateral cyanosis or clubbing.   Assessment/Plan:   Tomeshia Pizzi is a 44 y.o. 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 Disposition is to six cycles of IV carboplatin/paclitaxel chemotherapy per [facility] along with a referral to genetics.  We will obtain a urine sample for analysis today due to spasm-like symptoms.  Keflex 500 mg four times a day for seven days ordered for post-operative cellulitis of the abdominal incision.  Post-operative care including incisional care reinforced including binder use or other supportive garment use.  She is advised she will be contacted from the [facility] with a date and time for consultation with Dr. Evlyn Clines.  Future appointments for lab work, chemotherapy teaching, and financial counselors discussed.  Information given to the patient about carboplatin and taxol in her AVS.  [name] given to the patient with information from the [employer].  The social worker came at the end of the visit and discussed supportive programs offered to the patient as well.  She is advised to follow up with Dr. Thurston Pounds on Tues, July 19 as previously scheduled or sooner if needed.  Reportable signs and symptoms reviewed.  She is advised to call for any questions or concerns.        The patient was discussed with Dr. Fermin Schwab, who is agreeable with the above plan.  25 minutes were spend with patient counseling.    Yanelle Sousa DEAL, NP 11/24/2014, 3:44 PM

## 2014-11-21 NOTE — Patient Instructions (Addendum)
Plan to begin taking Keflex four times a day for seven days.  Follow up with Dr. Thurston Pounds as scheduled.  You will receive a phone call from the [facility] about your appts with Dr. Evlyn Clines, Medical Oncologist.  Please call for any questions or concerns.  Cephalexin tablets or capsules What is this medicine? CEPHALEXIN (sef a LEX in) is a cephalosporin antibiotic. It is used to treat certain kinds of bacterial infections It will not work for colds, flu, or other viral infections. This medicine may be used for other purposes; ask your health care provider or pharmacist if you have questions. COMMON BRAND NAME(S): Biocef, Keflex, Keftab What should I tell my health care provider before I take this medicine? They need to know if you have any of these conditions: -kidney disease -stomach or intestine problems, especially colitis -an unusual or allergic reaction to cephalexin, other cephalosporins, penicillins, other antibiotics, medicines, foods, dyes or preservatives -pregnant or trying to get pregnant -breast-feeding How should I use this medicine? Take this medicine by mouth with a full glass of water. Follow the directions on the prescription label. This medicine can be taken with or without food. Take your medicine at regular intervals. Do not take your medicine more often than directed. Take all of your medicine as directed even if you think you are better. Do not skip doses or stop your medicine early. Talk to your pediatrician regarding the use of this medicine in children. While this drug may be prescribed for selected conditions, precautions do apply. Overdosage: If you think you have taken too much of this medicine contact a poison control center or emergency room at once. NOTE: This medicine is only for you. Do not share this medicine with others. What if I miss a dose? If you miss a dose, take it as soon as you can. If it is almost time for your next dose, take only that dose. Do  not take double or extra doses. There should be at least 4 to 6 hours between doses. What may interact with this medicine? -probenecid -some other antibiotics This list may not describe all possible interactions. Give your health care provider a list of all the medicines, herbs, non-prescription drugs, or dietary supplements you use. Also tell them if you smoke, drink alcohol, or use illegal drugs. Some items may interact with your medicine. What should I watch for while using this medicine? Tell your doctor or health care professional if your symptoms do not begin to improve in a few days. Do not treat diarrhea with over the counter products. Contact your doctor if you have diarrhea that lasts more than 2 days or if it is severe and watery. If you have diabetes, you may get a false-positive result for sugar in your urine. Check with your doctor or health care professional. What side effects may I notice from receiving this medicine? Side effects that you should report to your doctor or health care professional as soon as possible: -allergic reactions like skin rash, itching or hives, swelling of the face, lips, or tongue -breathing problems -pain or trouble passing urine -redness, blistering, peeling or loosening of the skin, including inside the mouth -severe or watery diarrhea -unusually weak or tired -yellowing of the eyes, skin Side effects that usually do not require medical attention (report to your doctor or health care professional if they continue or are bothersome): -gas or heartburn -genital or anal irritation -headache -joint or muscle pain -nausea, vomiting This list may  not describe all possible side effects. Call your doctor for medical advice about side effects. You may report side effects to FDA at 1-800-FDA-1088. Where should I keep my medicine? Keep out of the reach of children. Store at room temperature between 59 and 86 degrees F (15 and 30 degrees C). Throw away any  unused medicine after the expiration date. NOTE: This sheet is a summary. It may not cover all possible information. If you have questions about this medicine, talk to your doctor, pharmacist, or health care provider.  2015, Elsevier/[employer] Standard. (2007-07-30 17:09:13)  Carboplatin injection What is this medicine? CARBOPLATIN (KAR boe pla tin) is a chemotherapy drug. It targets fast dividing cells, like cancer cells, and causes these cells to die. This medicine is used to treat ovarian cancer and many other cancers. This medicine may be used for other purposes; ask your health care provider or pharmacist if you have questions. COMMON BRAND NAME(S): Paraplatin What should I tell my health care provider before I take this medicine? They need to know if you have any of these conditions: -blood disorders -hearing problems -kidney disease -recent or ongoing radiation therapy -an unusual or allergic reaction to carboplatin, cisplatin, other chemotherapy, other medicines, foods, dyes, or preservatives -pregnant or trying to get pregnant -breast-feeding How should I use this medicine? This drug is usually given as an infusion into a vein. It is administered in a hospital or clinic by a specially trained health care professional. Talk to your pediatrician regarding the use of this medicine in children. Special care may be needed. Overdosage: If you think you have taken too much of this medicine contact a poison control center or emergency room at once. NOTE: This medicine is only for you. Do not share this medicine with others. What if I miss a dose? It is important not to miss a dose. Call your doctor or health care professional if you are unable to keep an appointment. What may interact with this medicine? -medicines for seizures -medicines to increase blood counts like filgrastim, pegfilgrastim, sargramostim -some antibiotics like amikacin, gentamicin, neomycin, streptomycin,  tobramycin -vaccines Talk to your doctor or health care professional before taking any of these medicines: -acetaminophen -aspirin -ibuprofen -ketoprofen -naproxen This list may not describe all possible interactions. Give your health care provider a list of all the medicines, herbs, non-prescription drugs, or dietary supplements you use. Also tell them if you smoke, drink alcohol, or use illegal drugs. Some items may interact with your medicine. What should I watch for while using this medicine? Your condition will be monitored carefully while you are receiving this medicine. You will need important blood work done while you are taking this medicine. This drug may make you feel generally unwell. This is not uncommon, as chemotherapy can affect healthy cells as well as cancer cells. Report any side effects. Continue your course of treatment even though you feel ill unless your doctor tells you to stop. In some cases, you may be given additional medicines to help with side effects. Follow all directions for their use. Call your doctor or health care professional for advice if you get a fever, chills or sore throat, or other symptoms of a cold or flu. Do not treat yourself. This drug decreases your body's ability to fight infections. Try to avoid being around people who are sick. This medicine may increase your risk to bruise or bleed. Call your doctor or health care professional if you notice any unusual bleeding. Be careful brushing and  flossing your teeth or using a toothpick because you may get an infection or bleed more easily. If you have any dental work done, tell your dentist you are receiving this medicine. Avoid taking products that contain aspirin, acetaminophen, ibuprofen, naproxen, or ketoprofen unless instructed by your doctor. These medicines may hide a fever. Do not become pregnant while taking this medicine. Women should inform their doctor if they wish to become pregnant or think  they might be pregnant. There is a potential for serious side effects to an unborn child. Talk to your health care professional or pharmacist for more information. Do not breast-feed an infant while taking this medicine. What side effects may I notice from receiving this medicine? Side effects that you should report to your doctor or health care professional as soon as possible: -allergic reactions like skin rash, itching or hives, swelling of the face, lips, or tongue -signs of infection - fever or chills, cough, sore throat, pain or difficulty passing urine -signs of decreased platelets or bleeding - bruising, pinpoint red spots on the skin, black, tarry stools, nosebleeds -signs of decreased red blood cells - unusually weak or tired, fainting spells, lightheadedness -breathing problems -changes in hearing -changes in vision -chest pain -high blood pressure -low blood counts - This drug may decrease the number of white blood cells, red blood cells and platelets. You may be at increased risk for infections and bleeding. -nausea and vomiting -pain, swelling, redness or irritation at the injection site -pain, tingling, numbness in the hands or feet -problems with balance, talking, walking -trouble passing urine or change in the amount of urine Side effects that usually do not require medical attention (report to your doctor or health care professional if they continue or are bothersome): -hair loss -loss of appetite -metallic taste in the mouth or changes in taste This list may not describe all possible side effects. Call your doctor for medical advice about side effects. You may report side effects to FDA at 1-800-FDA-1088. Where should I keep my medicine? This drug is given in a hospital or clinic and will not be stored at home. NOTE: This sheet is a summary. It may not cover all possible information. If you have questions about this medicine, talk to your doctor, pharmacist, or health care  provider.  2015, [employer] Standard. (2007-07-31 14:38:05)   Paclitaxel injection What is this medicine? PACLITAXEL (PAK li TAX el) is a chemotherapy drug. It targets fast dividing cells, like cancer cells, and causes these cells to die. This medicine is used to treat ovarian cancer, breast cancer, and other cancers. This medicine may be used for other purposes; ask your health care provider or pharmacist if you have questions. COMMON BRAND NAME(S): Onxol, Taxol What should I tell my health care provider before I take this medicine? They need to know if you have any of these conditions: -blood disorders -irregular heartbeat -infection (especially a virus infection such as chickenpox, cold sores, or herpes) -liver disease -previous or ongoing radiation therapy -an unusual or allergic reaction to paclitaxel, alcohol, polyoxyethylated castor oil, other chemotherapy agents, other medicines, foods, dyes, or preservatives -pregnant or trying to get pregnant -breast-feeding How should I use this medicine? This drug is given as an infusion into a vein. It is administered in a hospital or clinic by a specially trained health care professional. Talk to your pediatrician regarding the use of this medicine in children. Special care may be needed. Overdosage: If you think you have taken too much of  this medicine contact a poison control center or emergency room at once. NOTE: This medicine is only for you. Do not share this medicine with others. What if I miss a dose? It is important not to miss your dose. Call your doctor or health care professional if you are unable to keep an appointment. What may interact with this medicine? Do not take this medicine with any of the following medications: -disulfiram -metronidazole This medicine may also interact with the following medications: -cyclosporine -diazepam -ketoconazole -medicines to increase blood counts like filgrastim, pegfilgrastim,  sargramostim -other chemotherapy drugs like cisplatin, doxorubicin, epirubicin, etoposide, teniposide, vincristine -quinidine -testosterone -vaccines -verapamil Talk to your doctor or health care professional before taking any of these medicines: -acetaminophen -aspirin -ibuprofen -ketoprofen -naproxen This list may not describe all possible interactions. Give your health care provider a list of all the medicines, herbs, non-prescription drugs, or dietary supplements you use. Also tell them if you smoke, drink alcohol, or use illegal drugs. Some items may interact with your medicine. What should I watch for while using this medicine? Your condition will be monitored carefully while you are receiving this medicine. You will need important blood work done while you are taking this medicine. This drug may make you feel generally unwell. This is not uncommon, as chemotherapy can affect healthy cells as well as cancer cells. Report any side effects. Continue your course of treatment even though you feel ill unless your doctor tells you to stop. In some cases, you may be given additional medicines to help with side effects. Follow all directions for their use. Call your doctor or health care professional for advice if you get a fever, chills or sore throat, or other symptoms of a cold or flu. Do not treat yourself. This drug decreases your body's ability to fight infections. Try to avoid being around people who are sick. This medicine may increase your risk to bruise or bleed. Call your doctor or health care professional if you notice any unusual bleeding. Be careful brushing and flossing your teeth or using a toothpick because you may get an infection or bleed more easily. If you have any dental work done, tell your dentist you are receiving this medicine. Avoid taking products that contain aspirin, acetaminophen, ibuprofen, naproxen, or ketoprofen unless instructed by your doctor. These medicines may  hide a fever. Do not become pregnant while taking this medicine. Women should inform their doctor if they wish to become pregnant or think they might be pregnant. There is a potential for serious side effects to an unborn child. Talk to your health care professional or pharmacist for more information. Do not breast-feed an infant while taking this medicine. Men are advised not to father a child while receiving this medicine. What side effects may I notice from receiving this medicine? Side effects that you should report to your doctor or health care professional as soon as possible: -allergic reactions like skin rash, itching or hives, swelling of the face, lips, or tongue -low blood counts - This drug may decrease the number of white blood cells, red blood cells and platelets. You may be at increased risk for infections and bleeding. -signs of infection - fever or chills, cough, sore throat, pain or difficulty passing urine -signs of decreased platelets or bleeding - bruising, pinpoint red spots on the skin, black, tarry stools, nosebleeds -signs of decreased red blood cells - unusually weak or tired, fainting spells, lightheadedness -breathing problems -chest pain -high or low blood pressure -mouth  sores -nausea and vomiting -pain, swelling, redness or irritation at the injection site -pain, tingling, numbness in the hands or feet -slow or irregular heartbeat -swelling of the ankle, feet, hands Side effects that usually do not require medical attention (report to your doctor or health care professional if they continue or are bothersome): -bone pain -complete hair loss including hair on your head, underarms, pubic hair, eyebrows, and eyelashes -changes in the color of fingernails -diarrhea -loosening of the fingernails -loss of appetite -muscle or joint pain -red flush to skin -sweating This list may not describe all possible side effects. Call your doctor for medical advice about side  effects. You may report side effects to FDA at 1-800-FDA-1088. Where should I keep my medicine? This drug is given in a hospital or clinic and will not be stored at home. NOTE: This sheet is a summary. It may not cover all possible information. If you have questions about this medicine, talk to your doctor, pharmacist, or health care provider.  2015, [employer]vier/Gold Standard. (2012-06-18 16:41:21)

## 2014-11-21 NOTE — Telephone Encounter (Signed)
Per Joylene John, NP patient notified that urinalysis did not show any signs of infection. Patient appreciative of call and no other questions or concerns noted at this time.

## 2014-11-24 ENCOUNTER — Encounter: Payer: Self-pay | Admitting: Gynecologic Oncology

## 2014-11-24 ENCOUNTER — Telehealth: Payer: Self-pay | Admitting: Gynecologic Oncology

## 2014-11-24 DIAGNOSIS — T8149XA Infection following a procedure, other surgical site, initial encounter: Secondary | ICD-10-CM | POA: Insufficient documentation

## 2014-11-24 NOTE — Telephone Encounter (Signed)
Called to check on patient's current status.  Patient stating she has some light, clear drainage from the lower aspect of the incision but "not much."  She has just noted a few spots on her underwear.  States the redness has decreased substantially but she still has the hardened areas around the lower aspect.  She reports that some of the steri strips have fallen off the upper incision.  No signs of wound opening.  She reports she can tell a difference in the incisional support with the staples out but she has been wearing her binder intermittently.  Overall, she feels the incision is "improving definitely."  Denies fever, chills.  Advised to follow up with Dr. Thurston Pounds tomorrow and to call for any questions or concerns.

## 2014-11-26 ENCOUNTER — Encounter: Payer: Self-pay | Admitting: *Deleted

## 2014-11-26 ENCOUNTER — Other Ambulatory Visit: Payer: BLUE CROSS/BLUE SHIELD

## 2014-11-27 ENCOUNTER — Telehealth: Payer: Self-pay | Admitting: *Deleted

## 2014-11-27 NOTE — Telephone Encounter (Signed)
Received call from Audie Clear in scheduling stating patient requesting to reschedule appointment with Dr. Marko Plume tomorrow. Patient states "something personal has come up and she could come until after after lunch." Dr. Marko Plume notified and she states she is unable to see patient in the afternoon. Called patient to discuss appointment and strongly advise her to keep the appointment. Patient did not answer the phone - VM left requesting return phone call as soon as possible.

## 2014-11-27 NOTE — Telephone Encounter (Signed)
Called and reached patient by phone. Notified patient that Dr. Marko Plume is unable to see her tomorrow afternoon. Patient states she is able to keep the appointment tomorrow as scheduled.

## 2014-11-28 ENCOUNTER — Ambulatory Visit (HOSPITAL_BASED_OUTPATIENT_CLINIC_OR_DEPARTMENT_OTHER): Payer: BLUE CROSS/BLUE SHIELD

## 2014-11-28 ENCOUNTER — Encounter: Payer: Self-pay | Admitting: Gynecologic Oncology

## 2014-11-28 ENCOUNTER — Other Ambulatory Visit (HOSPITAL_BASED_OUTPATIENT_CLINIC_OR_DEPARTMENT_OTHER): Payer: BLUE CROSS/BLUE SHIELD

## 2014-11-28 ENCOUNTER — Telehealth: Payer: Self-pay | Admitting: Oncology

## 2014-11-28 ENCOUNTER — Encounter: Payer: Self-pay | Admitting: Oncology

## 2014-11-28 ENCOUNTER — Ambulatory Visit (HOSPITAL_BASED_OUTPATIENT_CLINIC_OR_DEPARTMENT_OTHER): Payer: BLUE CROSS/BLUE SHIELD | Admitting: Oncology

## 2014-11-28 VITALS — BP 121/72 | HR 73 | Temp 98.3°F | Resp 18 | Ht 68.0 in | Wt 289.2 lb

## 2014-11-28 DIAGNOSIS — C563 Malignant neoplasm of bilateral ovaries: Secondary | ICD-10-CM

## 2014-11-28 DIAGNOSIS — C562 Malignant neoplasm of left ovary: Secondary | ICD-10-CM

## 2014-11-28 DIAGNOSIS — E039 Hypothyroidism, unspecified: Secondary | ICD-10-CM

## 2014-11-28 DIAGNOSIS — L0390 Cellulitis, unspecified: Secondary | ICD-10-CM

## 2014-11-28 DIAGNOSIS — R300 Dysuria: Secondary | ICD-10-CM

## 2014-11-28 DIAGNOSIS — N809 Endometriosis, unspecified: Secondary | ICD-10-CM | POA: Diagnosis not present

## 2014-11-28 DIAGNOSIS — C561 Malignant neoplasm of right ovary: Secondary | ICD-10-CM | POA: Diagnosis not present

## 2014-11-28 DIAGNOSIS — Z6841 Body Mass Index (BMI) 40.0 and over, adult: Secondary | ICD-10-CM

## 2014-11-28 DIAGNOSIS — I878 Other specified disorders of veins: Secondary | ICD-10-CM

## 2014-11-28 DIAGNOSIS — N951 Menopausal and female climacteric states: Secondary | ICD-10-CM

## 2014-11-28 DIAGNOSIS — K219 Gastro-esophageal reflux disease without esophagitis: Secondary | ICD-10-CM

## 2014-11-28 HISTORY — DX: Malignant neoplasm of bilateral ovaries: C56.3

## 2014-11-28 LAB — COMPREHENSIVE METABOLIC PANEL (CC13)
ALT: 21 U/L (ref 0–55)
AST: 22 U/L (ref 5–34)
Albumin: 3.8 g/dL (ref 3.5–5.0)
Alkaline Phosphatase: 53 U/L (ref 40–150)
Anion Gap: 10 mEq/L (ref 3–11)
BUN: 9.8 mg/dL (ref 7.0–26.0)
CALCIUM: 9.1 mg/dL (ref 8.4–10.4)
CHLORIDE: 106 meq/L (ref 98–109)
CO2: 23 mEq/L (ref 22–29)
Creatinine: 0.7 mg/dL (ref 0.6–1.1)
EGFR: >90 mL/min/{1.73_m2} (ref >90)
Glucose: 117 mg/dl (ref 70–140)
Potassium: 4.1 mEq/L (ref 3.5–5.1)
Sodium: 139 mEq/L (ref 136–145)
Total Bilirubin: 0.99 mg/dL (ref 0.20–1.20)
Total Protein: 7 g/dL (ref 6.4–8.3)

## 2014-11-28 LAB — CBC WITH DIFFERENTIAL/PLATELET
BASO%: 0.1 % (ref 0.0–2.0)
BASOS ABS: 0 10*3/uL (ref 0.0–0.1)
EOS%: 3.8 % (ref 0.0–7.0)
Eosinophils Absolute: 0.4 10*3/uL (ref 0.0–0.5)
HEMATOCRIT: 36.9 % (ref 34.8–46.6)
HGB: 12.5 g/dL (ref 11.6–15.9)
LYMPH#: 2.3 10*3/uL (ref 0.9–3.3)
LYMPH%: 24.8 % (ref 14.0–49.7)
MCH: 30.1 pg (ref 25.1–34.0)
MCHC: 33.9 g/dL (ref 31.5–36.0)
MCV: 88.9 fL (ref 79.5–101.0)
MONO#: 0.6 10*3/uL (ref 0.1–0.9)
MONO%: 5.9 % (ref 0.0–14.0)
NEUT#: 6.2 10*3/uL (ref 1.5–6.5)
NEUT%: 65.4 % (ref 38.4–76.8)
PLATELETS: 323 10*3/uL (ref 145–400)
RBC: 4.15 10*6/uL (ref 3.70–5.45)
RDW: 12.6 % (ref 11.2–14.5)
WBC: 9.4 10*3/uL (ref 3.9–10.3)

## 2014-11-28 LAB — URINALYSIS, MICROSCOPIC - CHCC
BLOOD: NEGATIVE
Bilirubin (Urine): NEGATIVE
GLUCOSE UR CHCC: NEGATIVE mg/dL
KETONES: NEGATIVE mg/dL
Nitrite: NEGATIVE
PROTEIN: NEGATIVE mg/dL
RBC / HPF: NEGATIVE (ref 0–2)
Specific Gravity, Urine: 1.01 (ref 1.003–1.035)
Urobilinogen, UR: 0.2 mg/dL (ref 0.2–1)
pH: 7 (ref 4.6–8.0)

## 2014-11-28 MED ORDER — CEPHALEXIN 500 MG PO CAPS: ORAL_CAPSULE | ORAL | Status: DC

## 2014-11-28 MED ORDER — ONDANSETRON HCL 8 MG PO TABS: 8.0000 mg | ORAL_TABLET | Freq: Three times a day (TID) | ORAL | Status: DC | PRN

## 2014-11-28 MED ORDER — LORAZEPAM 1 MG PO TABS
ORAL_TABLET | ORAL | Status: DC
Start: 2014-11-28 — End: 2014-12-15

## 2014-11-28 MED ORDER — LIDOCAINE-PRILOCAINE 2.5-2.5 % EX CREA: TOPICAL_CREAM | CUTANEOUS | Status: DC

## 2014-11-28 MED ORDER — LORAZEPAM 1 MG PO TABS: ORAL_TABLET | ORAL | Status: DC

## 2014-11-28 MED ORDER — DEXAMETHASONE 4 MG PO TABS: ORAL_TABLET | ORAL | Status: DC

## 2014-11-28 MED ORDER — PANTOPRAZOLE SODIUM 40 MG PO TBEC: 40.0000 mg | DELAYED_RELEASE_TABLET | Freq: Every day | ORAL | Status: DC

## 2014-11-28 NOTE — Telephone Encounter (Signed)
Medical Oncology  Insurance required peer to peer for request for granix, [phone], option 3, patient's ID [id number];  Spoke with MD who refused to preauthorize granix as carboplatin taxol does not meet their requirements for this drug with start of regimen.  Per the insurance MD, if patient's counts drop following treatment, should call back to [phone] to request granix at that time "it should not be difficult to preauthorize if so". Note this call back # different from above.  I was given authorization case # for the chemo [id number] active 12-03-14 thru sometime in Dec 2016.  Godfrey Pick, MD

## 2014-11-28 NOTE — Patient Instructions (Addendum)
We will send prescriptions to your pharmacy  First chemo will be Friday 12-05-14 at 1130, so you should take the steroid decadron ~ 1130 PM on Thursday 7-28 and ~ 5:30 AM on Friday 7-29   1.decadron (dexamethasone, steroid) 4 mg. Take five tablets +(=20 mg) with food 12 hrs before taxol chemotherapy and five tablets with food 6 hrs before taxol   2.zofran (ondansetron) 8mg  One tablet every 8 hrs as needed for nausea. Will not make you drowsy. Fine to take one tablet AM after chemo whether or not any nausea then, to extend coverage for nausea a bit longer. Other than that dose, fine to take just as needed for nausea   3.ativan (lorazepam) 1 mg. 1/2 - 1 tablet swallow or dissolve under tongue every 6 hrs as needed for nausea. WIll make you drowsy and a little forgetful around each dose. Fine to take one tablet at bedtime night of chemo whether or not any nausea.  You can take the ativan before you go for portacath placement if needed.  Also 4.Protonix 40 mg one daily for reflux 5.Keflex antibiotic 500 mg every 6 hrs for additional 5 days. Please try to take by these instructions 6.EMLA cream: numbing cream for portacath. Apply ~30-60 min before port is to be accessed, cover with clear plastic wrap   You can call any time if needed [phone]

## 2014-11-28 NOTE — Progress Notes (Signed)
[name]   Name: [name] Date: November 28, 2014  MRN: [id number] DOB: 18-Dec-1970  REFERRING PHYSICIAN: Margaretmary Hall CC: [name], [name]), [facility]  Outside records from [name] reviewed in detail by MD and will be scanned into this EMR. Patient name on [facility] records  "[name]" as divorce has just been finalized, name now [name].   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 Patient lives in [place] and has requested adjuvant chemotherapy be done in [place], with gyn oncology follow up continuing with [name] at [name].    Patient had history of endometriosis and infertility, with no abnormal PAP smears but no recent gyn exams. She was in usual health until a few weeks of intermittent low grade nausea, then <24 hours of progressively more severe right upper quadrant abdominal pain. She presented to [facility] ED with the abdominal pain on 11-09-14, with abdominal US not remarkable, then CT AP demonstrated bilateral solid and cystic ovarian masses in total measuring 14 x 10 x 11 cm. She was transferred to [name],  with preoperative CA 125 of 554. On 11-10-14 she had exploratory laparotomy with supracervical hysterectomy, BSO, infracolic omentectomy, bilateral pelvic lymphadenectomy and aortic node sampling. Surgical findings were significant for chocolate colored ascites consistent with ruptured endometrioma, frozen pelvis consistent with stage IV endometriosis and bilateral retroperitoneal fibrosis requiring bilateral ureterolysis, sigmoid colon densely adherent to uterus and cerix, precluding cervical excision, normal  abdominal survey. . Surgical pathology from [name] [phone]) from 11-10-14 had grade 3 clear cell carcinoma in 6 cm right ovary and 11 cm left ovary with capsules intact, bilateral fallopian tubes not involved, uterus and omentum not involved, no LVSI. No malignant cells in ascites or peritoneal washings. Her postoperative course was uncomplicated. [name] multidisciplinary conference recommended 6 cycles of adjuvant taxol carboplatin. She will have 6 week post op visit with [name] (not yet scheduled). She attended chemotherapy education class at [name] on 11-26-14. Patient has nearly completed 7 days of Keflex for mild incisional erythema and tenderness present when she had staples removed by [name] on 11-21-14. She continues prophylactic lovenox, planned thru 12-11-14.     REVIEW OF SYSTEMS: Less tenderness and erythema at incision, still tender palpable area inferiorly where incision was completely closed when she saw [name] last. No fever, no bleeding, no vaginal drainage. Feels consistently hot moreso than just hot flashes. Pain is tolerable with increase in gabapentin, not using hydrocodone or dilaudid now. Bowels are moving well daily with senokot S one qhs since surgery. Slight dysuria at completion of voiding, with UA not remarkable last week. Frequent GERD, uses tums/simethicone/OTC pepcid ~ daily. No vomiting. Is eating and has increased water intake. IV access has been very difficult, already discussed PAC with [name]. Some swelling lower legs L>R since surgery, improves with elevation, no pain or cords, has been compliant with prophylactic lovenox as above. No glasses or contacts. No dental concerns. Does have environmental allergies/ sinus symptoms, improved with Claritin D. No lower respiratory symptoms or SOB. No cardiac symptoms. Last mammograms were at [name] office (not recent). Arthritis right  knee. No peripheral neuropathy. Had lost 16 lbs since she began biking a  month prior to this diagnosis.   Remainder of full 10 point review of systems negative.   ALLERGIES: Moxifloxacin; Quinolones; Doxycycline; Escitalopram oxalate; and Sulfonamide derivatives Avelox caused anaphylaxis. Sulfa caused rash.  Tolerated Keflex  PAST MEDICAL/ SURGICAL HISTORY:    G0 Endometriosis/ infertility Hypothyroid - follow up had been planned with PCP early July, not done with this surgery Laparoscopy for ruptured ovarian cyst 01-2009 Ganglion cyst right wrist Meniscus tear right knee - [name] Hx migraine HAs  CURRENT MEDICATIONS: reviewed as listed now in EMR. Prescriptions for decadron 20 mg with food 12 hrs and 6 hrs prior to taxol, zofran, ativan, EMLA, protonix 40 mg daily. I have mentioned claritin for taxol aches. Will continue Keflex for another 5 days, patient encouraged to take q 6 hrs as directed. Written and oral instructions for meds given.   SOCIAL HISTORY:  From [place]. Divorce just finalized, name changed from [name] back to [name]. Smoked for 3 mo prior to this diagnosis, now stopped. Some ETOH, mostly social, not daily. 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 Recently began biking with boyfriend, whom she has known since high school, which has been well tolerated with the knee.   FAMILY HISTORY:  Mother HTN, DM, CVA (deceased) Father living One brother healthy No children No family history of breast, ovarian or colon cancers.          PHYSICAL EXAM:  height is _0  (1.727 m) and weight is 289 lb 3 oz (131.175 kg). Her oral temperature is 98.3 F (36.8 C). Her blood pressure is 121/72 and her pulse is 73. Her respiration is 18 and oxygen saturation is 100%.  Alert, pleasant, cooperative lady looks stated age, good historian, very pleasant. Obese. Mobile without assistance. Looks mildly uncomfortable changing positions on exam table.  HEENT: normal hair pattern. PERRL, not icteric. Oral mucosa moist  and clear. Neck supple without JVD or thyroid mass . Supraclavicular fat pads but no clear supraclavicular adenopathy  RESPIRATORY: lungs clear to A and P CARDIAC/ VASCULAR: heart RRR no gallop. Clear heart sounds. Peripheral pulses intact and symmetrical  ABDOMEN:obese, soft, not obviously distended, some BS, no appreciable HSM or mass. Midline incision closed entirely, steristrips still in place. Mild erythema over a few cm at upper incision, lesser erythema over ~4x5 cm  Just below mid incision, and smoothly rounded, slightly firm, slightly tender area 2 cm at inferior incision. Gyn onc Sara also examined wound, which is improved compared with her exam last week.  LYMPH NODES: no cervical, supraclavicular, axillary or inguinal adenopathy  BREASTS: bilaterally without dominant mass, skin or nipple findings of concern  NEUROLOGIC: CN, motor, sensory, cerebellar nonfocal. PSYCH appropriate mood and affect  SKIN: without rash, ecchymosis, petechiae  MUSCULOSKELETAL: Back not tender. UE not remarkable. Bilateral pedal edema 1+ L>R no cords or tenderness.     LABORATORY DATA:  Results for orders placed or performed in visit on 11/28/14 (from the past 48 hour(s))  CBC with Differential     Status: None   Collection Time: 11/28/14  8:06 AM  Result Value Ref Range   WBC 9.4 3.9 - 10.3 10e3/uL   NEUT# 6.2 1.5 - 6.5 10e3/uL   HGB 12.5 11.6 - 15.9 g/dL   HCT 36.9 34.8 - 46.6 %   Platelets 323 145 - 400 10e3/uL   MCV 88.9 79.5 - 101.0  fL   MCH 30.1 25.1 - 34.0 pg   MCHC 33.9 31.5 - 36.0 g/dL   RBC 4.15 3.70 - 5.45 10e6/uL   RDW 12.6 11.2 - 14.5 %   lymph# 2.3 0.9 - 3.3 10e3/uL   MONO# 0.6 0.1 - 0.9 10e3/uL   Eosinophils Absolute 0.4 0.0 - 0.5 10e3/uL   Basophils Absolute 0.0 0.0 - 0.1 10e3/uL   NEUT% 65.4 38.4 - 76.8 %   LYMPH% 24.8 14.0 - 49.7 %   MONO% 5.9 0.0 - 14.0 %   EOS% 3.8 0.0 - 7.0 %   BASO% 0.1 0.0 - 2.0 %  Comprehensive metabolic panel (Cmet) - [facility]     Status: None    Collection Time: 11/28/14  8:06 AM  Result Value Ref Range   Sodium 139 136 - 145 mEq/L   Potassium 4.1 3.5 - 5.1 mEq/L   Chloride 106 98 - 109 mEq/L   CO2 23 22 - 29 mEq/L   Glucose 117 70 - 140 mg/dl   BUN 9.8 7.0 - 26.0 mg/dL   Creatinine 0.7 0.6 - 1.1 mg/dL   Total Bilirubin 0.99 0.20 - 1.20 mg/dL   Alkaline Phosphatase 53 40 - 150 U/L   AST 22 5 - 34 U/L   ALT 21 0 - 55 U/L   Total Protein 7.0 6.4 - 8.3 g/dL   Albumin 3.8 3.5 - 5.0 g/dL   Calcium 9.1 8.4 - 10.4 mg/dL   Anion Gap 10 3 - 11 mEq/L   EGFR >90 >90 ml/min/1.73 m2    Comment: eGFR is calculated using the CKD-EPI Creatinine Equation (2009)    UA C&S sent and pending due to dysuria ongoing.  PATHOLOGY: [facility] [id number] and [facility] cytology [phone] both from 11-10-14 Reports as above,  to be scanned into this EMR  RADIOGRAPHY: EXAM: ULTRASOUND ABDOMEN COMPLETE  11-09-14  COMPARISON: None.  FINDINGS: Gallbladder: No gallstones or wall thickening visualized. No sonographic Murphy sign noted.  Common bile duct: Diameter: 3.1 mm  Liver: No focal lesion identified. Within normal limits in parenchymal echogenicity.  IVC: No abnormality visualized.  Pancreas: Visualized portion unremarkable.  Spleen: Size and appearance within normal limits.  Right Kidney: Length: 12.8 cm. Echogenicity within normal limits. No mass or hydronephrosis visualized.  Left Kidney: Length: 12.7 cm. Echogenicity within normal limits. No mass or hydronephrosis visualized.  Abdominal aorta: No aneurysm visualized.  Other findings: None.  IMPRESSION: No acute abnormality.  EXAM: CT ABDOMEN AND PELVIS WITH CONTRAST  11-09-14  COMPARISON: 11/01/2008  FINDINGS: Lower chest: Normal.  Hepatobiliary: There is a new 17 mm vague area of lucency in the posterior aspect of the dome of the right lobe of the liver. There is slight chronic hepatomegaly. Biliary tree is normal.  Pancreas:  Normal.  Spleen: Normal.  Adrenals/Urinary Tract: Normal except for a tiny stone in the lower pole of the left kidney.  Stomach/Bowel: Normal.  Vascular/Lymphatic: No adenopathy. No atherosclerotic disease.  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 The total mass measures 14 x 10 x 11 cm. The uterus is deviated inferiorly by the large mass but otherwise appears normal.  Other: There is a small amount of ascites in the abdomen and pelvis.  Musculoskeletal: Normal.  IMPRESSION: 1. 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 Small amount of free fluid in the abdomen and pelvis. 2. Chronic hepatomegaly. There is a new vague 17 mm  lesion in the dome of the right lobe which is worrisome for metastatic disease.      DISCUSSION: All of history reviewed as above, including circumstances at presentation, findings on scans and at surgery, pathology information, rationale for adjuvant chemotherapy with taxol and carboplatin q 3 weeks x 6 cycles. We have discussed outpatient administration of the chemotherapy as well as possible side effects including nausea/ vomiting, hair loss, cytopenias, taxol aches, taxol neuropathy. We have discussed PAC and she is in agreement with having this scheduled with IR; she is anxious about procedures and may do better with some sedation for this. We have discussed antiemetics, prescriptions to be sent to her pharmacy. She has been given verbal and written instructions for premedication decadron with taxol. She will begin daily protonix. Claritin may be helpful for taxol (or gCSF) aches. Continue Keflex for another 5 days for findings at surgical area. Discussed genetics testing; patient in agreement with this referral Discussed CA 125 marker. Discussed follow up after completion of adjuvant chemotherapy.  Patient  expresses anxiety, moreso since surgery and as she is waiting to begin chemotherapy; discussed.  Verbal consent given for chemotherapy.   IMPRESSION / PLAN:  1.IB grade 3 clear cell carcinoma of bilateral ovaries: post R0 resection 11-10-14 at [facility]. Plan 6 cycles of q 3 week taxol carboplatin beginning 12-05-14. NOTE her insurance refused preauthorization for granix up front, see phone note also today with contact # for drug to be authorized during treatment if needed.  Genetics counseling 2.improving cellulitis at wound: continue keflex another 5 days 3.poor peripheral venous access: PAC for chemo 4.LE swelling: patient instructed to call if this increases or if any tenderness or pain, would need LE venous dopplers if so. Continue prophylactic lovenox planned 4 weeks. 5.endometriosis 6.surgical menopause: hot flashes controlled with celexa and gabapentin now 7.hypothyroidism: recently diagnosed, due follow up with PCP since beginning synthroid 8. Degenerative arthritis right knee 9.morbid obesity, BMI 44. She had lost 16 lbs in one month after starting bike riding 10.hs migraines 11.anaphylaxis to avelox, rash to sulfa 12.chronic GERD: will begin protonix as this may aggravate chemo nausea otherwise 13.environmental allergies controlled with prn claritin D    Patient and accompanying individuals have had questions answered to their satisfaction and are in agreement with plan above. They can contact this office for questions or concerns at any time prior to next scheduled visit.   Chemo orders entered. Granix orders entered NOTE NOT AUTHORIZED BY INSURANCE, WILL NEED TO CALL IF NEEDED. Cc Drs Thurston Hall and [name]. Time spent  60 min, including >50% discussion and coordination of care.    LIVESAY,LENNIS P, MD 11/28/2014 9:54 AM

## 2014-11-28 NOTE — Progress Notes (Signed)
Stop lovenox injections 24 hours prior to port-a-cath insertion.

## 2014-11-28 NOTE — Telephone Encounter (Signed)
Gave avs & calendar for August °

## 2014-11-29 LAB — URINE CULTURE

## 2014-12-01 ENCOUNTER — Telehealth: Payer: Self-pay | Admitting: Oncology

## 2014-12-01 NOTE — Telephone Encounter (Signed)
Faxed office note for 07/22 to Dr. Margaretmary Bayley @ [phone]

## 2014-12-02 ENCOUNTER — Telehealth: Payer: Self-pay

## 2014-12-02 ENCOUNTER — Encounter: Payer: Self-pay | Admitting: Oncology

## 2014-12-02 MED ORDER — FLUCONAZOLE 100 MG PO TABS: 100.0000 mg | ORAL_TABLET | Freq: Every day | ORAL | Status: DC

## 2014-12-02 NOTE — Progress Notes (Signed)
Per Beth zofran(ondansetron) approved 11/02/14-12/02/15. Pa#- [id number]

## 2014-12-02 NOTE — Addendum Note (Signed)
Addended by: Wyonia Hough on: 12/02/2014 04:14 PM   Modules accepted: Orders

## 2014-12-02 NOTE — Telephone Encounter (Signed)
Left message with request for patient to call back to inform nursing if she is still symptomatic ornot as she has yeast in urine not bacteria. Also stated that the Zofran has to be prior authorized and this may not occur until 12-05-14 but will follow up with her on 12-05-14 while receiving treatment. regarding status of authorization.

## 2014-12-02 NOTE — Telephone Encounter (Signed)
E-SCRIBED DIFLUCAN TO PHARMACY.

## 2014-12-02 NOTE — Telephone Encounter (Signed)
-----   Message from Gordy Levan, MD sent at 12/02/2014  9:01 AM EDT ----- Labs seen and need follow up: please let her know that she does not have a bacterial bladder infection, but does have some yeast. Please give diflucan 100 mg x 3 days if she is still having any symptoms

## 2014-12-03 ENCOUNTER — Other Ambulatory Visit: Payer: Self-pay | Admitting: Radiology

## 2014-12-03 ENCOUNTER — Telehealth: Payer: Self-pay

## 2014-12-03 NOTE — Telephone Encounter (Signed)
[name] stated that she will take 5 tabs of decadron with food at 1130 pm on 12-04-14 ad repeat at 0530 on 12-05-14. Reviewed taking ativan tab. At hs the night of chemotherapy 12-05-14 whether or not nauseous.  She will take a Zofran tablet in the am on 12-06-14 wether or not nauseous and then use meds prn as directed on prescriptions. Discussed to use heating pad or warm bath for Taxol aches, claritin 10 mg tab daily, as well as tylenol or Norco if aches severe.

## 2014-12-03 NOTE — Telephone Encounter (Signed)
-----   Message from Gordy Levan, MD sent at 11/28/2014 10:47 AM EDT ----- RN please call her before first long taxol carbo on 7-29 to review decadron and antiemetics Be sure to check on her by phone week after chemo as LL will not see until 8-8. If concerns should have Cyndee or other see with labs that week. thanks

## 2014-12-04 ENCOUNTER — Encounter (HOSPITAL_COMMUNITY): Payer: Self-pay

## 2014-12-04 ENCOUNTER — Ambulatory Visit (HOSPITAL_COMMUNITY)
Admission: RE | Admit: 2014-12-04 | Discharge: 2014-12-04 | Disposition: A | Discharge status: Home / Self Care | Payer: BLUE CROSS/BLUE SHIELD | Source: Ambulatory Visit | Attending: Oncology | Admitting: Oncology

## 2014-12-04 ENCOUNTER — Other Ambulatory Visit: Payer: Self-pay | Admitting: Physician Assistant

## 2014-12-04 ENCOUNTER — Other Ambulatory Visit: Payer: Self-pay | Admitting: Oncology

## 2014-12-04 DIAGNOSIS — F419 Anxiety disorder, unspecified: Secondary | ICD-10-CM | POA: Diagnosis not present

## 2014-12-04 DIAGNOSIS — R160 Hepatomegaly, not elsewhere classified: Secondary | ICD-10-CM | POA: Diagnosis not present

## 2014-12-04 DIAGNOSIS — I878 Other specified disorders of veins: Secondary | ICD-10-CM

## 2014-12-04 DIAGNOSIS — Z7982 Long term (current) use of aspirin: Secondary | ICD-10-CM | POA: Diagnosis not present

## 2014-12-04 DIAGNOSIS — R1011 Right upper quadrant pain: Secondary | ICD-10-CM | POA: Insufficient documentation

## 2014-12-04 DIAGNOSIS — Z7902 Long term (current) use of antithrombotics/antiplatelets: Secondary | ICD-10-CM | POA: Diagnosis not present

## 2014-12-04 DIAGNOSIS — C561 Malignant neoplasm of right ovary: Secondary | ICD-10-CM | POA: Diagnosis not present

## 2014-12-04 DIAGNOSIS — C562 Malignant neoplasm of left ovary: Principal | ICD-10-CM

## 2014-12-04 DIAGNOSIS — C563 Malignant neoplasm of bilateral ovaries: Secondary | ICD-10-CM

## 2014-12-04 LAB — PROTIME-INR
INR: 0.99 (ref 0.00–1.49)
PROTHROMBIN TIME: 13.3 s (ref 11.6–15.2)

## 2014-12-04 LAB — CBC WITH DIFFERENTIAL/PLATELET
BASOS ABS: 0 10*3/uL (ref 0.0–0.1)
Basophils Relative: 0 % (ref 0–1)
Eosinophils Absolute: 0.2 10*3/uL (ref 0.0–0.7)
Eosinophils Relative: 3 % (ref 0–5)
HCT: 38 % (ref 36.0–46.0)
Hemoglobin: 12.7 g/dL (ref 12.0–15.0)
Lymphocytes Relative: 27 % (ref 12–46)
Lymphs Abs: 2.5 10*3/uL (ref 0.7–4.0)
MCH: 30.1 pg (ref 26.0–34.0)
MCHC: 33.4 g/dL (ref 30.0–36.0)
MCV: 90 fL (ref 78.0–100.0)
MONO ABS: 0.6 10*3/uL (ref 0.1–1.0)
Monocytes Relative: 6 % (ref 3–12)
Neutro Abs: 6.1 10*3/uL (ref 1.7–7.7)
Neutrophils Relative %: 64 % (ref 43–77)
Platelets: 304 10*3/uL (ref 150–400)
RBC: 4.22 MIL/uL (ref 3.87–5.11)
RDW: 12.6 % (ref 11.5–15.5)
WBC: 9.4 10*3/uL (ref 4.0–10.5)

## 2014-12-04 MED ORDER — HEPARIN SOD (PORK) LOCK FLUSH 100 UNIT/ML IV SOLN
INTRAVENOUS | Status: AC | PRN
  Administered 2014-12-04: 500 [IU]

## 2014-12-04 MED ORDER — ONDANSETRON HCL 4 MG/2ML IJ SOLN
INTRAMUSCULAR | Status: AC | PRN
  Administered 2014-12-04: 4 mg via INTRAVENOUS

## 2014-12-04 MED ORDER — MIDAZOLAM HCL 2 MG/2ML IJ SOLN
INTRAMUSCULAR | Status: AC
  Filled 2014-12-04: qty 4

## 2014-12-04 MED ORDER — CEFAZOLIN SODIUM-DEXTROSE 2-3 GM-% IV SOLR
INTRAVENOUS | Status: AC
  Filled 2014-12-04: qty 50

## 2014-12-04 MED ORDER — MIDAZOLAM HCL 2 MG/2ML IJ SOLN
INTRAMUSCULAR | Status: AC
  Filled 2014-12-04: qty 6

## 2014-12-04 MED ORDER — CEFAZOLIN SODIUM-DEXTROSE 2-3 GM-% IV SOLR
2.0000 g | Freq: Once | INTRAVENOUS | Status: AC
  Administered 2014-12-04: 2 g via INTRAVENOUS

## 2014-12-04 MED ORDER — MIDAZOLAM HCL 2 MG/2ML IJ SOLN
INTRAMUSCULAR | Status: AC | PRN
  Administered 2014-12-04: 1 mg via INTRAVENOUS
  Administered 2014-12-04: 2 mg via INTRAVENOUS
  Administered 2014-12-04 (×5): 1 mg via INTRAVENOUS

## 2014-12-04 MED ORDER — FENTANYL CITRATE (PF) 100 MCG/2ML IJ SOLN
INTRAMUSCULAR | Status: AC
  Filled 2014-12-04: qty 4

## 2014-12-04 MED ORDER — FENTANYL CITRATE (PF) 100 MCG/2ML IJ SOLN
INTRAMUSCULAR | Status: AC | PRN
  Administered 2014-12-04 (×3): 25 ug via INTRAVENOUS
  Administered 2014-12-04: 50 ug via INTRAVENOUS
  Administered 2014-12-04: 25 ug via INTRAVENOUS

## 2014-12-04 MED ORDER — LIDOCAINE-EPINEPHRINE 2 %-1:100000 IJ SOLN
INTRAMUSCULAR | Status: AC
  Filled 2014-12-04: qty 1

## 2014-12-04 MED ORDER — HEPARIN SOD (PORK) LOCK FLUSH 100 UNIT/ML IV SOLN
INTRAVENOUS | Status: AC
  Filled 2014-12-04: qty 5

## 2014-12-04 MED ORDER — SODIUM CHLORIDE 0.9 % IV SOLN
INTRAVENOUS | Status: DC
  Administered 2014-12-04: 12:00:00 via INTRAVENOUS

## 2014-12-04 MED ORDER — LIDOCAINE HCL 1 % IJ SOLN
INTRAMUSCULAR | Status: AC
  Filled 2014-12-04: qty 20

## 2014-12-04 MED ORDER — ONDANSETRON HCL 4 MG/2ML IJ SOLN
INTRAMUSCULAR | Status: AC
  Filled 2014-12-04: qty 2

## 2014-12-04 NOTE — Procedures (Signed)
Interventional Radiology Procedure Note  Procedure: Placement of a right IJ approach single lumen PowerPort.  Tip is positioned at the superior cavoatrial junction and catheter is ready for immediate use.  Complications: None Recommendations:  - Ok to shower tomorrow - Do not submerge for 7 days - Routine line care   Signed,  Miku Udall S. Merita Hawks, DO   

## 2014-12-04 NOTE — Discharge Instructions (Signed)
Implanted Port Insertion, Care After °Refer to this sheet in the next few weeks. These instructions provide you with information on caring for yourself after your procedure. Your health care provider may also give you more specific instructions. Your treatment has been planned according to current medical practices, but problems sometimes occur. Call your health care provider if you have any problems or questions after your procedure. °WHAT TO EXPECT AFTER THE PROCEDURE °After your procedure, it is typical to have the following:  °· Discomfort at the port insertion site. Ice packs to the area will help. °· Bruising on the skin over the port. This will subside in 3-4 days. °HOME CARE INSTRUCTIONS °· After your port is placed, you will get a manufacturer's information card. The card has information about your port. Keep this card with you at all times.   °· Know what kind of port you have. There are many types of ports available.   °· Wear a medical alert bracelet in case of an emergency. This can help alert health care workers that you have a port.   °· The port can stay in for as long as your health care provider believes it is necessary.   °· A home health care nurse may give medicines and take care of the port.   °· You or a family member can get special training and directions for giving medicine and taking care of the port at home.   °SEEK MEDICAL CARE IF:  °· Your port does not flush or you are unable to get a blood return.   °· You have a fever or chills. °SEEK IMMEDIATE MEDICAL CARE IF: °· You have new fluid or pus coming from your incision.   °· You notice a bad smell coming from your incision site.   °· You have swelling, pain, or more redness at the incision or port site.   °· You have chest pain or shortness of breath. °Document Released: 02/13/2013 Document Revised: 04/30/2013 Document Reviewed: 02/13/2013 °ExitCare® Patient Information ©2015 [employer]. This information is not intended to replace  advice given to you by your health care provider. Make sure you discuss any questions you have with your health care provider. °Implanted Port Home Guide °An implanted port is a type of central line that is placed under the skin. Central lines are used to provide IV access when treatment or nutrition needs to be given through a person's veins. Implanted ports are used for long-term IV access. An implanted port may be placed because:  °· You need IV medicine that would be irritating to the small veins in your hands or arms.   °· You need long-term IV medicines, such as antibiotics.   °· You need IV nutrition for a long period.   °· You need frequent blood draws for lab tests.   °· You need dialysis.   °Implanted ports are usually placed in the chest area, but they can also be placed in the upper arm, the abdomen, or the leg. An implanted port has two main parts:  °· Reservoir. The reservoir is round and will appear as a small, raised area under your skin. The reservoir is the part where a needle is inserted to give medicines or draw blood.   °· Catheter. The catheter is a thin, flexible tube that extends from the reservoir. The catheter is placed into a large vein. Medicine that is inserted into the reservoir goes into the catheter and then into the vein.   °HOW WILL I CARE FOR MY INCISION SITE? °Do not get the incision site wet. Bathe or   shower as directed by your health care provider.  °HOW IS MY PORT ACCESSED? °Special steps must be taken to access the port:  °· Before the port is accessed, a numbing cream can be placed on the skin. This helps numb the skin over the port site.   °· Your health care provider uses a sterile technique to access the port. °· Your health care provider must put on a mask and sterile gloves. °· The skin over your port is cleaned carefully with an antiseptic and allowed to dry. °· The port is gently pinched between sterile gloves, and a needle is inserted into the port. °· Only  "non-coring" port needles should be used to access the port. Once the port is accessed, a blood return should be checked. This helps ensure that the port is in the vein and is not clogged.   °· If your port needs to remain accessed for a constant infusion, a clear (transparent) bandage will be placed over the needle site. The bandage and needle will need to be changed every week, or as directed by your health care provider.   °· Keep the bandage covering the needle clean and dry. Do not get it wet. Follow your health care provider's instructions on how to take a shower or bath while the port is accessed.   °· If your port does not need to stay accessed, no bandage is needed over the port.   °WHAT IS FLUSHING? °Flushing helps keep the port from getting clogged. Follow your health care provider's instructions on how and when to flush the port. Ports are usually flushed with saline solution or a medicine called heparin. The need for flushing will depend on how the port is used.  °· If the port is used for intermittent medicines or blood draws, the port will need to be flushed:   °· After medicines have been given.   °· After blood has been drawn.   °· As part of routine maintenance.   °· If a constant infusion is running, the port may not need to be flushed.   °HOW LONG WILL MY PORT STAY IMPLANTED? °The port can stay in for as long as your health care provider thinks it is needed. When it is time for the port to come out, surgery will be done to remove it. The procedure is similar to the one performed when the port was put in.  °WHEN SHOULD I SEEK IMMEDIATE MEDICAL CARE? °When you have an implanted port, you should seek immediate medical care if:  °· You notice a bad smell coming from the incision site.   °· You have swelling, redness, or drainage at the incision site.   °· You have more swelling or pain at the port site or the surrounding area.   °· You have a fever that is not controlled with medicine. °Document  Released: 04/25/2005 Document Revised: 02/13/2013 Document Reviewed: 12/31/2012 °ExitCare® Patient Information ©2015 [employer]. This information is not intended to replace advice given to you by your health care provider. Make sure you discuss any questions you have with your health care provider.Conscious Sedation, Adult, Care After °Refer to this sheet in the next few weeks. These instructions provide you with information on caring for yourself after your procedure. Your health care provider may also give you more specific instructions. Your treatment has been planned according to current medical practices, but problems sometimes occur. Call your health care provider if you have any problems or questions after your procedure. °WHAT TO EXPECT AFTER THE PROCEDURE  °After your procedure: °·   You may feel sleepy, clumsy, and have poor balance for several hours. °· Vomiting may occur if you eat too soon after the procedure. °HOME CARE INSTRUCTIONS °· Do not participate in any activities where you could become injured for at least 24 hours. Do not: °¨ Drive. °¨ Swim. °¨ Ride a bicycle. °¨ Operate heavy machinery. °¨ Cook. °¨ Use power tools. °¨ Climb ladders. °¨ Work from a high place. °· Do not make important decisions or sign legal documents until you are improved. °· If you vomit, drink water, juice, or soup when you can drink without vomiting. Make sure you have little or no nausea before eating solid foods. °· Only take over-the-counter or prescription medicines for pain, discomfort, or fever as directed by your health care provider. °· Make sure you and your family fully understand everything about the medicines given to you, including what side effects may occur. °· You should not drink alcohol, take sleeping pills, or take medicines that cause drowsiness for at least 24 hours. °· If you smoke, do not smoke without supervision. °· If you are feeling better, you may resume normal activities 24 hours after you  were sedated. °· Keep all appointments with your health care provider. °SEEK MEDICAL CARE IF: °· Your skin is pale or bluish in color. °· You continue to feel nauseous or vomit. °· Your pain is getting worse and is not helped by medicine. °· You have bleeding or swelling. °· You are still sleepy or feeling clumsy after 24 hours. °SEEK IMMEDIATE MEDICAL CARE IF: °· You develop a rash. °· You have difficulty breathing. °· You develop any type of allergic problem. °· You have a fever. °MAKE SURE YOU: °· Understand these instructions. °· Will watch your condition. °· Will get help right away if you are not doing well or get worse. °Document Released: 02/13/2013 Document Reviewed: 02/13/2013 °ExitCare® Patient Information ©2015 [employer]. This information is not intended to replace advice given to you by your health care provider. Make sure you discuss any questions you have with your health care provider. ° °

## 2014-12-04 NOTE — H&P (Signed)
Chief Complaint: Patient was seen in consultation today for port a cath placement at the request of Livesay,Lennis P  Referring Physician(s): Livesay,Lennis P  History of Present Illness: [name] is a 44 y.o. 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 She presents today for port a cath placement for chemotherapy.   Past Medical History  Diagnosis Date  . Anxiety   . Endometriosis   . Ovarian cancer, bilateral 11/28/2014    Past Surgical History  Procedure Laterality Date  . Abdominal hysterectomy  11/10/14    at [facility], Exp lap, supracervical hyst, BSO, infracolic omentectomy, lymphadenectomy, aortic lymph node sampling    Allergies: Moxifloxacin; Quinolones; Doxycycline; Escitalopram oxalate; and Sulfonamide derivatives  Medications: Prior to Admission medications   Medication Sig Start Date End Date Taking? Authorizing Provider  ALPRAZolam Duanne Moron) 1 MG tablet 1/2 tab by mouth twice per day as needed Patient taking differently: Take 1 mg by mouth daily as needed for anxiety. 1/2 tab by mouth twice per day as nee 06/24/14  Yes Biagio Borg, MD  cephALEXin (KEFLEX) 500 MG capsule Take 1 capsule (500 mg total) by mouth 4 (four) times daily. 11/21/14  Yes Melissa D Cross, NP  citalopram (CELEXA) 40 MG tablet Take 1 tablet (40 mg total) by mouth daily. 01/02/14  Yes Biagio Borg, MD  Cyanocobalamin (VITAMIN B-12 PO) Take 2 tablets by mouth daily.   Yes Historical Provider, MD  enoxaparin (LOVENOX) 40 MG/0.4ML injection Inject 40 mg into the skin daily.  11/13/14 12/11/14 Yes Historical Provider, MD  fluconazole (DIFLUCAN) 100 MG tablet Take 1 tablet (100 mg total) by mouth daily. 12/02/14  Yes Lennis Marion Downer, MD  gabapentin (NEURONTIN) 300 MG capsule Take 300 mg by mouth 3  (three) times daily.  11/13/14 11/13/15 Yes Historical Provider, MD  albuterol (PROVENTIL HFA;VENTOLIN HFA) 108 (90 BASE) MCG/ACT inhaler Inhale 2 puffs into the lungs every 6 (six) hours as needed for wheezing or shortness of breath. Patient not taking: Reported on 11/21/2014 06/24/14   Biagio Borg, MD  aspirin-acetaminophen-caffeine [facility] MIGRAINE) [phone] MG per tablet Take 2 tablets by mouth 2 (two) times daily as needed for headache or migraine.    Historical Provider, MD  cephALEXin (KEFLEX) 500 MG capsule Take 500 mg every 6 hrs for additional 5 days. Please try to take by these instructions 11/28/14   Gordy Levan, MD  cyclobenzaprine (FLEXERIL) 5 MG tablet Take 1 tablet (5 mg total) by mouth 3 (three) times daily as needed for muscle spasms. Patient not taking: Reported on 11/09/2014 01/02/14   Biagio Borg, MD  dexamethasone (DECADRON) 4 MG tablet Take 5 tablets with food 12 hrs and 6 hrs prior to Taxol chemotherapy 11/28/14   Lennis Marion Downer, MD  diclofenac sodium (VOLTAREN) 1 % GEL Use topically four times per day as needed for pain Patient taking differently: Apply 1 application topically 2 (two) times daily as needed (knee pain).  06/24/14   Biagio Borg, MD  hydrochlorothiazide (HYDRODIURIL) 25 MG tablet 1/2 tab by mouth per day as needed for swelling Patient not taking: Reported on 11/28/2014 01/02/14   Biagio Borg, MD  HYDROcodone-acetaminophen (NORCO/VICODIN) 5-325 MG per tablet Take 1-2 tablets by mouth every 4 (four) hours as needed. 11/25/14   Historical Provider, MD  HYDROmorphone (DILAUDID) 2 MG tablet Take  2 mg by mouth every 4 (four) hours as needed.  11/13/14   Historical Provider, MD  ibuprofen (ADVIL,MOTRIN) 200 MG tablet Take 400-800 mg by mouth 2 (two) times daily as needed (knee pain).    Historical Provider, MD  ibuprofen (ADVIL,MOTRIN) 600 MG tablet Take 600 mg by mouth every 6 (six) hours as needed. Pt taking bid    Historical Provider, MD  levothyroxine (LEVOTHROID) 25  MCG tablet Take 1 tablet (25 mcg total) by mouth daily before breakfast. 09/10/14   Biagio Borg, MD  lidocaine-prilocaine (EMLA) cream Apply 1-2 hrs prior to Porta-Cath access 11/28/14   Lennis P Marko Plume, MD  LORazepam (ATIVAN) 1 MG tablet Place 1/2 -1 tablet under the tongue or swallow every 6 hours as needed for nausea. Will make drowsy / forgetful around each dose. 11/28/14   Lennis Marion Downer, MD  naproxen (NAPROSYN) 500 MG tablet Take 1 tablet (500 mg total) by mouth 2 (two) times daily with a meal. As needed for pain Patient not taking: Reported on 11/21/2014 02/14/14   Biagio Borg, MD  ondansetron (ZOFRAN) 8 MG tablet Take 1 tablet (8 mg total) by mouth every 8 (eight) hours as needed for nausea or vomiting. Will not make drowsy. 11/28/14   Lennis Marion Downer, MD  pantoprazole (PROTONIX) 40 MG tablet Take 1 tablet (40 mg total) by mouth daily. 11/28/14   Lennis Marion Downer, MD  sennosides-docusate sodium (SENOKOT-S) 8.6-50 MG tablet Take 1 tablet by mouth daily.  11/13/14 11/13/15  Historical Provider, MD  simethicone (MYLICON) 80 MG chewable tablet Chew 80 mg by mouth as needed.  11/13/14   Historical Provider, MD  SUMAtriptan (IMITREX) 100 MG tablet Take 1 tablet (100 mg total) by mouth every 2 (two) hours as needed for migraine or headache. May repeat in 2 hours if headache persists or recurs. Patient not taking: Reported on 11/28/2014 01/02/14   Biagio Borg, MD  triamcinolone cream (KENALOG) 0.1 % Apply 1 application topically 2 (two) times daily. Patient taking differently: Apply 1 application topically daily at 12 noon. For psoriasis 01/02/14   Biagio Borg, MD     Family History  Problem Relation Age of Onset  . Hypertension Mother   . Diabetes Mother     History   Social History  . Marital Status: Divorced    Spouse Name: N/A  . Number of Children: N/A  . Years of Education: N/A   Social History Main Topics  . Smoking status: Never Smoker   . Smokeless tobacco: Not on file  . Alcohol Use:  Yes     Comment: occassionally  . Drug Use: No  . Sexual Activity: Not on file   Other Topics Concern  . None   Social History Narrative      Review of Systems  Constitutional: Negative for fever and chills.  Respiratory: Negative for cough and shortness of breath.   Cardiovascular: Negative for chest pain.  Gastrointestinal: Positive for abdominal pain. Negative for vomiting and blood in stool.       Occ reflux  Genitourinary: Negative for hematuria.  Musculoskeletal:       Occ back pain  Neurological: Negative for headaches.  Psychiatric/Behavioral: The patient is nervous/anxious.     Vital Signs: LMP 11/02/2014  BP 137/81  HR 80  R 16  TEMP 98.7  O2 SATS 98% RA  Physical Exam  Constitutional: She is oriented to person, place, and time. She appears well-developed and well-nourished.  Cardiovascular: Normal  rate and regular rhythm.   Pulmonary/Chest: Effort normal.  Sl dim BS rt base, left clear  Abdominal: Soft. Bowel sounds are normal. There is tenderness.  Obese; midline incision with small amt erythema  Musculoskeletal: Normal range of motion. She exhibits edema.  Neurological: She is alert and oriented to person, place, and time.    Mallampati Score:     Imaging: US Abdomen Complete  11/09/2014   CLINICAL DATA:  Right upper quadrant abdomen pain.  EXAM: ULTRASOUND ABDOMEN COMPLETE  COMPARISON:  None.  FINDINGS: Gallbladder: No gallstones or wall thickening visualized. No sonographic Murphy sign noted.  Common bile duct: Diameter: 3.1 mm  Liver: No focal lesion identified. Within normal limits in parenchymal echogenicity.  IVC: No abnormality visualized.  Pancreas: Visualized portion unremarkable.  Spleen: Size and appearance within normal limits.  Right Kidney: Length: 12.8 cm. Echogenicity within normal limits. No mass or hydronephrosis visualized.  Left Kidney: Length: 12.7 cm. Echogenicity within normal limits. No mass or hydronephrosis visualized.  Abdominal  aorta: No aneurysm visualized.  Other findings: None.  IMPRESSION: No acute abnormality.   Electronically Signed   By: Abelardo Diesel M.D.   On: 11/09/2014 18:03   Ct Abdomen Pelvis W Contrast  11/09/2014   CLINICAL DATA:  Right upper quadrant abdominal pain. Shortness of breath.  EXAM: CT ABDOMEN AND PELVIS WITH CONTRAST  TECHNIQUE: Multidetector CT imaging of the abdomen and pelvis was performed using the standard protocol following bolus administration of intravenous contrast.  CONTRAST:  148mL OMNIPAQUE IOHEXOL 300 MG/ML  SOLN  COMPARISON:  11/01/2008  FINDINGS: Lower chest:  Normal.  Hepatobiliary: There is a new 17 mm vague area of lucency in the posterior aspect of the dome of the right lobe of the liver. There is slight chronic hepatomegaly. Biliary tree is normal.  Pancreas: Normal.  Spleen: Normal.  Adrenals/Urinary Tract: Normal except for a tiny stone in the lower pole of the left kidney.  Stomach/Bowel: Normal.  Vascular/Lymphatic: No adenopathy.  No atherosclerotic disease.  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 The total mass measures 14 x 10 x 11 cm. The uterus is deviated inferiorly by the large mass but otherwise appears normal.  Other: There is a small amount of ascites in the abdomen and pelvis.  Musculoskeletal: Normal.  IMPRESSION: 1. 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 Small amount of free fluid in the abdomen and pelvis. 2. Chronic hepatomegaly. There is a new vague 17 mm lesion in the dome of the right lobe which is worrisome for metastatic disease.   Electronically Signed   By: Lorriane Shire M.D.   On: 11/09/2014 19:48    Labs:  CBC:  Recent Labs  11/09/14 1504 11/28/14 0806  WBC 22.7* 9.4  HGB 14.9 12.5  HCT 44.1 36.9  PLT 321 323    COAGS: No results for input(s): INR, APTT in the last 8760 hours.  BMP:  Recent Labs   11/09/14 1504 11/28/14 0806  NA 138 139  K 3.8 4.1  CL 103  --   CO2 27 23  GLUCOSE 104* 117  BUN 6 9.8  CALCIUM 10.0 9.1  CREATININE 0.77 0.7  GFRNONAA >60  --   GFRAA >60  --     LIVER FUNCTION TESTS:  Recent Labs  11/09/14 1504 11/28/14 0806  BILITOT 0.7 0.99  AST 19 22  ALT 17 21  ALKPHOS 54 53  PROT 7.2 7.0  ALBUMIN 4.2 3.8    TUMOR MARKERS: No results for input(s): AFPTM, CEA, CA199, CHROMGRNA in the last 8760 hours.  Assessment and Plan: [name] is a 44 y.o. 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 She presents today for port a cath placement for chemotherapy. Risks and benefits discussed with the patient/father including, but not limited to bleeding, infection, pneumothorax, or fibrin sheath development and need for additional procedures. All of the patient's questions were answered, patient is agreeable to proceed. Consent signed and in chart.     Signed: D. Rowe Robert 12/04/2014, 12:01 PM   I spent a total of 30 minutes in face to face in clinical consultation, greater than 50% of which was counseling/coordinating care for port a cath placement

## 2014-12-05 ENCOUNTER — Ambulatory Visit: Payer: BLUE CROSS/BLUE SHIELD

## 2014-12-05 ENCOUNTER — Telehealth: Payer: Self-pay | Admitting: *Deleted

## 2014-12-05 ENCOUNTER — Ambulatory Visit (HOSPITAL_BASED_OUTPATIENT_CLINIC_OR_DEPARTMENT_OTHER): Payer: BLUE CROSS/BLUE SHIELD

## 2014-12-05 ENCOUNTER — Ambulatory Visit (HOSPITAL_BASED_OUTPATIENT_CLINIC_OR_DEPARTMENT_OTHER): Payer: BLUE CROSS/BLUE SHIELD | Admitting: Nurse Practitioner

## 2014-12-05 ENCOUNTER — Encounter: Payer: Self-pay | Admitting: Nurse Practitioner

## 2014-12-05 ENCOUNTER — Other Ambulatory Visit: Payer: Self-pay | Admitting: Oncology

## 2014-12-05 ENCOUNTER — Other Ambulatory Visit (HOSPITAL_BASED_OUTPATIENT_CLINIC_OR_DEPARTMENT_OTHER): Payer: BLUE CROSS/BLUE SHIELD

## 2014-12-05 VITALS — BP 144/78 | HR 60 | Temp 98.9°F | Resp 18

## 2014-12-05 DIAGNOSIS — C562 Malignant neoplasm of left ovary: Secondary | ICD-10-CM

## 2014-12-05 DIAGNOSIS — Z95828 Presence of other vascular implants and grafts: Secondary | ICD-10-CM

## 2014-12-05 DIAGNOSIS — Z5111 Encounter for antineoplastic chemotherapy: Secondary | ICD-10-CM | POA: Diagnosis not present

## 2014-12-05 DIAGNOSIS — C561 Malignant neoplasm of right ovary: Secondary | ICD-10-CM

## 2014-12-05 DIAGNOSIS — F411 Generalized anxiety disorder: Secondary | ICD-10-CM | POA: Diagnosis not present

## 2014-12-05 DIAGNOSIS — M549 Dorsalgia, unspecified: Secondary | ICD-10-CM

## 2014-12-05 DIAGNOSIS — C563 Malignant neoplasm of bilateral ovaries: Secondary | ICD-10-CM

## 2014-12-05 DIAGNOSIS — T7840XA Allergy, unspecified, initial encounter: Secondary | ICD-10-CM | POA: Insufficient documentation

## 2014-12-05 DIAGNOSIS — M25561 Pain in right knee: Secondary | ICD-10-CM | POA: Diagnosis not present

## 2014-12-05 DIAGNOSIS — M25562 Pain in left knee: Secondary | ICD-10-CM | POA: Diagnosis not present

## 2014-12-05 LAB — COMPREHENSIVE METABOLIC PANEL (CC13)
ALBUMIN: 4.1 g/dL (ref 3.5–5.0)
ALT: 18 U/L (ref 0–55)
AST: 14 U/L (ref 5–34)
Alkaline Phosphatase: 54 U/L (ref 40–150)
Anion Gap: 11 mEq/L (ref 3–11)
BILIRUBIN TOTAL: 0.68 mg/dL (ref 0.20–1.20)
BUN: 10.8 mg/dL (ref 7.0–26.0)
CALCIUM: 9.7 mg/dL (ref 8.4–10.4)
CO2: 23 meq/L (ref 22–29)
Chloride: 103 mEq/L (ref 98–109)
Creatinine: 0.8 mg/dL (ref 0.6–1.1)
EGFR: 86 mL/min/{1.73_m2} — AB (ref >90)
Glucose: 237 mg/dl — ABNORMAL HIGH (ref 70–140)
POTASSIUM: 4 meq/L (ref 3.5–5.1)
SODIUM: 137 meq/L (ref 136–145)
Total Protein: 7.6 g/dL (ref 6.4–8.3)

## 2014-12-05 LAB — CBC WITH DIFFERENTIAL/PLATELET
BASO%: 0.1 % (ref 0.0–2.0)
BASOS ABS: 0 10*3/uL (ref 0.0–0.1)
EOS%: 0 % (ref 0.0–7.0)
Eosinophils Absolute: 0 10*3/uL (ref 0.0–0.5)
HEMATOCRIT: 39.1 % (ref 34.8–46.6)
HEMOGLOBIN: 13.2 g/dL (ref 11.6–15.9)
LYMPH%: 9.8 % — ABNORMAL LOW (ref 14.0–49.7)
MCH: 30.1 pg (ref 25.1–34.0)
MCHC: 33.7 g/dL (ref 31.5–36.0)
MCV: 89.3 fL (ref 79.5–101.0)
MONO#: 0 10*3/uL — ABNORMAL LOW (ref 0.1–0.9)
MONO%: 0.4 % (ref 0.0–14.0)
NEUT#: 9.2 10*3/uL — ABNORMAL HIGH (ref 1.5–6.5)
NEUT%: 89.7 % — ABNORMAL HIGH (ref 38.4–76.8)
Platelets: 263 10*3/uL (ref 145–400)
RBC: 4.37 10*6/uL (ref 3.70–5.45)
RDW: 13.1 % (ref 11.2–14.5)
WBC: 10.2 10*3/uL (ref 3.9–10.3)
lymph#: 1 10*3/uL (ref 0.9–3.3)

## 2014-12-05 MED ORDER — FAMOTIDINE IN NACL 20-0.9 MG/50ML-% IV SOLN
20.0000 mg | Freq: Once | INTRAVENOUS | Status: AC | PRN
  Administered 2014-12-05: 20 mg via INTRAVENOUS

## 2014-12-05 MED ORDER — PACLITAXEL CHEMO INJECTION 300 MG/50ML
175.0000 mg/m2 | Freq: Once | INTRAVENOUS | Status: AC
  Administered 2014-12-05: 438 mg via INTRAVENOUS
  Filled 2014-12-05: qty 73

## 2014-12-05 MED ORDER — LORAZEPAM 2 MG/ML IJ SOLN
INTRAMUSCULAR | Status: AC
  Filled 2014-12-05: qty 1

## 2014-12-05 MED ORDER — SODIUM CHLORIDE 0.9 % IJ SOLN
10.0000 mL | INTRAMUSCULAR | Status: DC | PRN
  Administered 2014-12-05: 10 mL
  Filled 2014-12-05: qty 10

## 2014-12-05 MED ORDER — METHYLPREDNISOLONE SODIUM SUCC 125 MG IJ SOLR
125.0000 mg | Freq: Once | INTRAMUSCULAR | Status: AC | PRN
  Administered 2014-12-05: 125 mg via INTRAVENOUS

## 2014-12-05 MED ORDER — DIPHENHYDRAMINE HCL 50 MG/ML IJ SOLN
INTRAMUSCULAR | Status: AC
  Filled 2014-12-05: qty 1

## 2014-12-05 MED ORDER — HEPARIN SOD (PORK) LOCK FLUSH 100 UNIT/ML IV SOLN
500.0000 [IU] | Freq: Once | INTRAVENOUS | Status: AC | PRN
  Administered 2014-12-05: 500 [IU]
  Filled 2014-12-05: qty 5

## 2014-12-05 MED ORDER — FAMOTIDINE IN NACL 20-0.9 MG/50ML-% IV SOLN
20.0000 mg | Freq: Once | INTRAVENOUS | Status: AC
  Administered 2014-12-05: 20 mg via INTRAVENOUS

## 2014-12-05 MED ORDER — DIPHENHYDRAMINE HCL 50 MG/ML IJ SOLN
25.0000 mg | Freq: Once | INTRAMUSCULAR | Status: AC | PRN
  Administered 2014-12-05: 50 mg via INTRAVENOUS

## 2014-12-05 MED ORDER — DIPHENHYDRAMINE HCL 50 MG/ML IJ SOLN
50.0000 mg | Freq: Once | INTRAMUSCULAR | Status: AC
  Administered 2014-12-05: 50 mg via INTRAVENOUS

## 2014-12-05 MED ORDER — SODIUM CHLORIDE 0.9 % IJ SOLN
10.0000 mL | INTRAMUSCULAR | Status: DC | PRN
  Administered 2014-12-05: 10 mL via INTRAVENOUS
  Filled 2014-12-05: qty 10

## 2014-12-05 MED ORDER — SODIUM CHLORIDE 0.9 % IV SOLN
Freq: Once | INTRAVENOUS | Status: AC
  Administered 2014-12-05: 12:00:00 via INTRAVENOUS

## 2014-12-05 MED ORDER — LORAZEPAM 2 MG/ML IJ SOLN
0.5000 mg | Freq: Once | INTRAMUSCULAR | Status: AC
  Administered 2014-12-05: 0.5 mg via INTRAVENOUS

## 2014-12-05 MED ORDER — FAMOTIDINE IN NACL 20-0.9 MG/50ML-% IV SOLN
INTRAVENOUS | Status: AC
  Filled 2014-12-05: qty 50

## 2014-12-05 MED ORDER — SODIUM CHLORIDE 0.9 % IV SOLN
750.0000 mg | Freq: Once | INTRAVENOUS | Status: AC
  Administered 2014-12-05: 750 mg via INTRAVENOUS
  Filled 2014-12-05: qty 75

## 2014-12-05 MED ORDER — SODIUM CHLORIDE 0.9 % IV SOLN
Freq: Once | INTRAVENOUS | Status: AC
  Administered 2014-12-05: 12:00:00 via INTRAVENOUS
  Filled 2014-12-05: qty 8

## 2014-12-05 NOTE — Progress Notes (Signed)
1246: Pt reported severe sudden bilateral knee pain 10/10. Taxol stopped, normal saline started wide open. Pain radiated "from hip bones to shins." Hypersensitivity meds given per protocol. Retta Mac, NP notified, in to see pt. Order received to continue IV fluids and monitor pt.  1315: Pt reports resolution of leg and hip pain. Reports restless legs. Up to restroom with standby assistance.  1325: Retta Mac, NP in to see pt. Order received for Ativan 0.5 mg IV. Re-challenge with Taxol. Pt in agreement with plan. 1700: Pt tolerated re-challenge. Reports only restless legs.

## 2014-12-05 NOTE — Patient Instructions (Addendum)
[facility] Discharge Instructions for Patients Receiving Chemotherapy  Today you received the following chemotherapy agents: Taxol, Carboplatin   To help prevent nausea and vomiting after your treatment, we encourage you to take your nausea medication as directed Zofran 8MG : Take 1 tablet by mouth every 8 hours as needed for nausea or vomiting. For nausea that does not respond to Zofran, take Ativan every 6 hours as needed.   If you develop nausea and vomiting that is not controlled by your nausea medication, call the clinic.   BELOW ARE SYMPTOMS THAT SHOULD BE REPORTED IMMEDIATELY:  *FEVER GREATER THAN 100.5 F  *CHILLS WITH OR WITHOUT FEVER  NAUSEA AND VOMITING THAT IS NOT CONTROLLED WITH YOUR NAUSEA MEDICATION  *UNUSUAL SHORTNESS OF BREATH  *UNUSUAL BRUISING OR BLEEDING  TENDERNESS IN MOUTH AND THROAT WITH OR WITHOUT PRESENCE OF ULCERS  *URINARY PROBLEMS  *BOWEL PROBLEMS  UNUSUAL RASH Items with * indicate a potential emergency and should be followed up as soon as possible.  Feel free to call the clinic should you have any questions or concerns. The clinic phone number is [phone].  Please show the [facility] at check-in to the Emergency Department and triage nurse.   Paclitaxel injection What is this medicine? PACLITAXEL (PAK li TAX el) is a chemotherapy drug. It targets fast dividing cells, like cancer cells, and causes these cells to die. This medicine is used to treat ovarian cancer, breast cancer, and other cancers. This medicine may be used for other purposes; ask your health care provider or pharmacist if you have questions. COMMON BRAND NAME(S): Onxol, Taxol What should I tell my health care provider before I take this medicine? They need to know if you have any of these conditions: -blood disorders -irregular heartbeat -infection (especially a virus infection such as chickenpox, cold sores, or herpes) -liver disease -previous or  ongoing radiation therapy -an unusual or allergic reaction to paclitaxel, alcohol, polyoxyethylated castor oil, other chemotherapy agents, other medicines, foods, dyes, or preservatives -pregnant or trying to get pregnant -breast-feeding How should I use this medicine? This drug is given as an infusion into a vein. It is administered in a hospital or clinic by a specially trained health care professional. Talk to your pediatrician regarding the use of this medicine in children. Special care may be needed. Overdosage: If you think you have taken too much of this medicine contact a poison control center or emergency room at once. NOTE: This medicine is only for you. Do not share this medicine with others. What if I miss a dose? It is important not to miss your dose. Call your doctor or health care professional if you are unable to keep an appointment. What may interact with this medicine? Do not take this medicine with any of the following medications: -disulfiram -metronidazole This medicine may also interact with the following medications: -cyclosporine -diazepam -ketoconazole -medicines to increase blood counts like filgrastim, pegfilgrastim, sargramostim -other chemotherapy drugs like cisplatin, doxorubicin, epirubicin, etoposide, teniposide, vincristine -quinidine -testosterone -vaccines -verapamil Talk to your doctor or health care professional before taking any of these medicines: -acetaminophen -aspirin -ibuprofen -ketoprofen -naproxen This list may not describe all possible interactions. Give your health care provider a list of all the medicines, herbs, non-prescription drugs, or dietary supplements you use. Also tell them if you smoke, drink alcohol, or use illegal drugs. Some items may interact with your medicine. What should I watch for while using this medicine? Your condition will be monitored carefully while  you are receiving this medicine. You will need important blood  work done while you are taking this medicine. This drug may make you feel generally unwell. This is not uncommon, as chemotherapy can affect healthy cells as well as cancer cells. Report any side effects. Continue your course of treatment even though you feel ill unless your doctor tells you to stop. In some cases, you may be given additional medicines to help with side effects. Follow all directions for their use. Call your doctor or health care professional for advice if you get a fever, chills or sore throat, or other symptoms of a cold or flu. Do not treat yourself. This drug decreases your body's ability to fight infections. Try to avoid being around people who are sick. This medicine may increase your risk to bruise or bleed. Call your doctor or health care professional if you notice any unusual bleeding. Be careful brushing and flossing your teeth or using a toothpick because you may get an infection or bleed more easily. If you have any dental work done, tell your dentist you are receiving this medicine. Avoid taking products that contain aspirin, acetaminophen, ibuprofen, naproxen, or ketoprofen unless instructed by your doctor. These medicines may hide a fever. Do not become pregnant while taking this medicine. Women should inform their doctor if they wish to become pregnant or think they might be pregnant. There is a potential for serious side effects to an unborn child. Talk to your health care professional or pharmacist for more information. Do not breast-feed an infant while taking this medicine. Men are advised not to father a child while receiving this medicine. What side effects may I notice from receiving this medicine? Side effects that you should report to your doctor or health care professional as soon as possible: -allergic reactions like skin rash, itching or hives, swelling of the face, lips, or tongue -low blood counts - This drug may decrease the number of white blood cells, red  blood cells and platelets. You may be at increased risk for infections and bleeding. -signs of infection - fever or chills, cough, sore throat, pain or difficulty passing urine -signs of decreased platelets or bleeding - bruising, pinpoint red spots on the skin, black, tarry stools, nosebleeds -signs of decreased red blood cells - unusually weak or tired, fainting spells, lightheadedness -breathing problems -chest pain -high or low blood pressure -mouth sores -nausea and vomiting -pain, swelling, redness or irritation at the injection site -pain, tingling, numbness in the hands or feet -slow or irregular heartbeat -swelling of the ankle, feet, hands Side effects that usually do not require medical attention (report to your doctor or health care professional if they continue or are bothersome): -bone pain -complete hair loss including hair on your head, underarms, pubic hair, eyebrows, and eyelashes -changes in the color of fingernails -diarrhea -loosening of the fingernails -loss of appetite -muscle or joint pain -red flush to skin -sweating This list may not describe all possible side effects. Call your doctor for medical advice about side effects. You may report side effects to FDA at 1-800-FDA-1088. Where should I keep my medicine? This drug is given in a hospital or clinic and will not be stored at home. NOTE: This sheet is a summary. It may not cover all possible information. If you have questions about this medicine, talk to your doctor, pharmacist, or health care provider.  2015, [employer]. (2012-06-18 16:41:21)   Carboplatin injection What is this medicine? CARBOPLATIN (KAR boe pla tin)  is a chemotherapy drug. It targets fast dividing cells, like cancer cells, and causes these cells to die. This medicine is used to treat ovarian cancer and many other cancers. This medicine may be used for other purposes; ask your health care provider or pharmacist if you have  questions. COMMON BRAND NAME(S): Paraplatin What should I tell my health care provider before I take this medicine? They need to know if you have any of these conditions: -blood disorders -hearing problems -kidney disease -recent or ongoing radiation therapy -an unusual or allergic reaction to carboplatin, cisplatin, other chemotherapy, other medicines, foods, dyes, or preservatives -pregnant or trying to get pregnant -breast-feeding How should I use this medicine? This drug is usually given as an infusion into a vein. It is administered in a hospital or clinic by a specially trained health care professional. Talk to your pediatrician regarding the use of this medicine in children. Special care may be needed. Overdosage: If you think you have taken too much of this medicine contact a poison control center or emergency room at once. NOTE: This medicine is only for you. Do not share this medicine with others. What if I miss a dose? It is important not to miss a dose. Call your doctor or health care professional if you are unable to keep an appointment. What may interact with this medicine? -medicines for seizures -medicines to increase blood counts like filgrastim, pegfilgrastim, sargramostim -some antibiotics like amikacin, gentamicin, neomycin, streptomycin, tobramycin -vaccines Talk to your doctor or health care professional before taking any of these medicines: -acetaminophen -aspirin -ibuprofen -ketoprofen -naproxen This list may not describe all possible interactions. Give your health care provider a list of all the medicines, herbs, non-prescription drugs, or dietary supplements you use. Also tell them if you smoke, drink alcohol, or use illegal drugs. Some items may interact with your medicine. What should I watch for while using this medicine? Your condition will be monitored carefully while you are receiving this medicine. You will need important blood work done while you are  taking this medicine. This drug may make you feel generally unwell. This is not uncommon, as chemotherapy can affect healthy cells as well as cancer cells. Report any side effects. Continue your course of treatment even though you feel ill unless your doctor tells you to stop. In some cases, you may be given additional medicines to help with side effects. Follow all directions for their use. Call your doctor or health care professional for advice if you get a fever, chills or sore throat, or other symptoms of a cold or flu. Do not treat yourself. This drug decreases your body's ability to fight infections. Try to avoid being around people who are sick. This medicine may increase your risk to bruise or bleed. Call your doctor or health care professional if you notice any unusual bleeding. Be careful brushing and flossing your teeth or using a toothpick because you may get an infection or bleed more easily. If you have any dental work done, tell your dentist you are receiving this medicine. Avoid taking products that contain aspirin, acetaminophen, ibuprofen, naproxen, or ketoprofen unless instructed by your doctor. These medicines may hide a fever. Do not become pregnant while taking this medicine. Women should inform their doctor if they wish to become pregnant or think they might be pregnant. There is a potential for serious side effects to an unborn child. Talk to your health care professional or pharmacist for more information. Do not breast-feed an infant while  taking this medicine. What side effects may I notice from receiving this medicine? Side effects that you should report to your doctor or health care professional as soon as possible: -allergic reactions like skin rash, itching or hives, swelling of the face, lips, or tongue -signs of infection - fever or chills, cough, sore throat, pain or difficulty passing urine -signs of decreased platelets or bleeding - bruising, pinpoint red spots on the  skin, black, tarry stools, nosebleeds -signs of decreased red blood cells - unusually weak or tired, fainting spells, lightheadedness -breathing problems -changes in hearing -changes in vision -chest pain -high blood pressure -low blood counts - This drug may decrease the number of white blood cells, red blood cells and platelets. You may be at increased risk for infections and bleeding. -nausea and vomiting -pain, swelling, redness or irritation at the injection site -pain, tingling, numbness in the hands or feet -problems with balance, talking, walking -trouble passing urine or change in the amount of urine Side effects that usually do not require medical attention (report to your doctor or health care professional if they continue or are bothersome): -hair loss -loss of appetite -metallic taste in the mouth or changes in taste This list may not describe all possible side effects. Call your doctor for medical advice about side effects. You may report side effects to FDA at 1-800-FDA-1088. Where should I keep my medicine? This drug is given in a hospital or clinic and will not be stored at home. NOTE: This sheet is a summary. It may not cover all possible information. If you have questions about this medicine, talk to your doctor, pharmacist, or health care provider.  2015, [employer]. (2007-07-31 14:38:05)

## 2014-12-05 NOTE — Assessment & Plan Note (Signed)
Patient has resided diagnosed with bilateral ovarian cancer; and presented to the [facility] today to receive her first cycle of Taxol/carboplatin chemotherapy regimen.  She did develop a significant reaction within the first few minutes of her Taxol infusion; with a primary complaint of severe pain from her knees up to her back.  She was also very anxious as well.  Hypersensitivity reaction was managed per hypersensitivity protocol; and all of patient's symptoms did resolve.  Patient was able to complete all of her chemotherapy as directed today.  Patient has plans to return on 12/15/2014 for labs and a follow-up visit.

## 2014-12-05 NOTE — Patient Instructions (Signed)

## 2014-12-05 NOTE — Progress Notes (Signed)
SYMPTOM MANAGEMENT CLINIC   HPI: [name] 44 y.o. female diagnosed with bilateral ovarian cancer.  Here today to initiate her first cycle of Taxol/carboplatin chemotherapy regimen.  Patient had received approximately 5-10 minutes of her initial Taxol chemotherapy infusion when she developed severe pain from her bilateral knees up towards her back.  She also was increasingly anxious and tearful as well.  Confirmed the patient did receive premedications of both Zofran 16 mg, and dexamethasone 20 mg.  Patient states that she also took Ativan 1 mg tablet from her home supply of medications.  Taxol infusion was held; and patient received Benadryl 50 mg, Pepcid 20 mg, Solu-Medrol 125 mg, and Ativan 0.5 mg IV.  All symptoms did eventually resolve; and patient was able to proceed with the rest of her chemotherapy as directed.  HPI  ROS  Past Medical History  Diagnosis Date  . Anxiety   . Endometriosis   . Ovarian cancer, bilateral 11/28/2014    Past Surgical History  Procedure Laterality Date  . Abdominal hysterectomy  11/10/14    at [facility], Exp lap, supracervical hyst, BSO, infracolic omentectomy, lymphadenectomy, aortic lymph node sampling    has OBESITY, MORBID; Anxiety state; DEPRESSION; MIGRAINE HEADACHE; Unspecified otitis media; EAR PAIN, BILATERAL; Acute sinusitis; ALLERGIC RHINITIS; Acute bronchospasm; GERD; ENDOMETRIOSIS; INGROWN TOENAIL; KNEE PAIN, RIGHT; BACK PAIN; Insomnia; RASH-NONVESICULAR; Edema; Lower back pain; Dyspnea; Right knee pain; Wellness examination; Hypothyroidism; Hypersomnolence; Postoperative cellulitis of surgical wound; Ovarian cancer, bilateral; Dysuria; Poor venous access; Morbid obesity with BMI of 40.0-44.9, adult; and Hypersensitivity reaction on her problem list.    is allergic to moxifloxacin; quinolones; doxycycline; escitalopram oxalate; and sulfonamide derivatives.    Medication List       This list is accurate as of: 12/05/14  7:09 PM.   Always use your most recent med list.               albuterol 108 (90 BASE) MCG/ACT inhaler  Commonly known as:  PROVENTIL HFA;VENTOLIN HFA  Inhale 2 puffs into the lungs every 6 (six) hours as needed for wheezing or shortness of breath.     ALPRAZolam 1 MG tablet  Commonly known as:  XANAX  1/2 tab by mouth twice per day as needed     aspirin-acetaminophen-caffeine 250-250-65 MG per tablet  Commonly known as:  EXCEDRIN MIGRAINE  Take 2 tablets by mouth 2 (two) times daily as needed for headache or migraine.     cephALEXin 500 MG capsule  Commonly known as:  KEFLEX  Take 1 capsule (500 mg total) by mouth 4 (four) times daily.     cephALEXin 500 MG capsule  Commonly known as:  KEFLEX  Take 500 mg every 6 hrs for additional 5 days. Please try to take by these instructions     citalopram 40 MG tablet  Commonly known as:  CELEXA  Take 1 tablet (40 mg total) by mouth daily.     cyclobenzaprine 5 MG tablet  Commonly known as:  FLEXERIL  Take 1 tablet (5 mg total) by mouth 3 (three) times daily as needed for muscle spasms.     dexamethasone 4 MG tablet  Commonly known as:  DECADRON  Take 5 tablets with food 12 hrs and 6 hrs prior to Taxol chemotherapy     diclofenac sodium 1 % Gel  Commonly known as:  VOLTAREN  Use topically four times per day as needed for pain     enoxaparin 40 MG/0.4ML injection  Commonly known as:  LOVENOX  Inject 40 mg into the skin daily.     fluconazole 100 MG tablet  Commonly known as:  DIFLUCAN  Take 1 tablet (100 mg total) by mouth daily.     gabapentin 300 MG capsule  Commonly known as:  NEURONTIN  Take 300 mg by mouth 3 (three) times daily.     hydrochlorothiazide 25 MG tablet  Commonly known as:  HYDRODIURIL  1/2 tab by mouth per day as needed for swelling     HYDROcodone-acetaminophen 5-325 MG per tablet  Commonly known as:  NORCO/VICODIN  Take 1-2 tablets by mouth every 4 (four) hours as needed.     ibuprofen 200 MG tablet    Commonly known as:  ADVIL,MOTRIN  Take 400-800 mg by mouth 2 (two) times daily as needed (knee pain).     levothyroxine 25 MCG tablet  Commonly known as:  LEVOTHROID  Take 1 tablet (25 mcg total) by mouth daily before breakfast.     lidocaine-prilocaine cream  Commonly known as:  EMLA  Apply 1-2 hrs prior to Porta-Cath access     LORazepam 1 MG tablet  Commonly known as:  ATIVAN  Place 1/2 -1 tablet under the tongue or swallow every 6 hours as needed for nausea. Will make drowsy / forgetful around each dose.     naproxen 500 MG tablet  Commonly known as:  NAPROSYN  Take 1 tablet (500 mg total) by mouth 2 (two) times daily with a meal. As needed for pain     ondansetron 8 MG tablet  Commonly known as:  ZOFRAN  Take 1 tablet (8 mg total) by mouth every 8 (eight) hours as needed for nausea or vomiting. Will not make drowsy.     pantoprazole 40 MG tablet  Commonly known as:  PROTONIX  Take 1 tablet (40 mg total) by mouth daily.     PRESCRIPTION MEDICATION  Chemo chcc     sennosides-docusate sodium 8.6-50 MG tablet  Commonly known as:  SENOKOT-S  Take 1 tablet by mouth daily.     simethicone 80 MG chewable tablet  Commonly known as:  MYLICON  Chew 80 mg by mouth as needed.     SUMAtriptan 100 MG tablet  Commonly known as:  IMITREX  Take 1 tablet (100 mg total) by mouth every 2 (two) hours as needed for migraine or headache. May repeat in 2 hours if headache persists or recurs.     triamcinolone cream 0.1 %  Commonly known as:  KENALOG  Apply 1 application topically 2 (two) times daily.     VITAMIN B-12 PO  Take 2 tablets by mouth daily.         PHYSICAL EXAMINATION  Oncology Vitals 12/05/2014 12/05/2014 12/05/2014 12/05/2014 12/05/2014 12/05/2014 12/05/2014  Height - - - - - - -  Weight - - - - - - -  Weight (lbs) - - - - - - -  BMI (kg/m2) - - - - - - -  Temp 98.9 98.9 99.4 98.4 99 - -  Pulse 60 68 62 66 72 73 75  Resp _0 SpO2 - - 98 96 97 100  99  BSA (m2) - - - - - - -   BP Readings from Last 3 Encounters:  12/05/14 144/78  12/04/14 137/81  12/04/14 133/78    Physical Exam  Constitutional: She is oriented to person, place, and time and well-developed, well-nourished, and in no distress.  HENT:  Head: Normocephalic and atraumatic.  Eyes: Conjunctivae and EOM are normal. Pupils are equal, round, and reactive to light. Right eye exhibits no discharge. Left eye exhibits no discharge. No scleral icterus.  Neck: Normal range of motion. Neck supple. No JVD present. No tracheal deviation present. No thyromegaly present.  Cardiovascular: Normal rate, regular rhythm, normal heart sounds and intact distal pulses.   Pulmonary/Chest: Effort normal and breath sounds normal. No stridor. No respiratory distress. She has no wheezes. She has no rales. She exhibits no tenderness.  Abdominal: Soft. Bowel sounds are normal. She exhibits no distension and no mass. There is no tenderness. There is no rebound and no guarding.  Musculoskeletal: Normal range of motion. She exhibits no edema or tenderness.  Lymphadenopathy:    She has no cervical adenopathy.  Neurological: She is alert and oriented to person, place, and time. Gait normal.  Skin: Skin is warm and dry. No rash noted. No erythema. No pallor.  Psychiatric:  Did appear extremely anxious and tearful in the midst of her hypersensitivity reaction; but all symptoms did resolve.  Nursing note and vitals reviewed.   LABORATORY DATA:. Appointment on 12/05/2014  Component Date Value Ref Range Status  . WBC 12/05/2014 10.2  3.9 - 10.3 10e3/uL Final  . NEUT# 12/05/2014 9.2* 1.5 - 6.5 10e3/uL Final  . HGB 12/05/2014 13.2  11.6 - 15.9 g/dL Final  . HCT 12/05/2014 39.1  34.8 - 46.6 % Final  . Platelets 12/05/2014 263  145 - 400 10e3/uL Final  . MCV 12/05/2014 89.3  79.5 - 101.0 fL Final  . MCH 12/05/2014 30.1  25.1 - 34.0 pg Final  . MCHC 12/05/2014 33.7  31.5 - 36.0 g/dL Final  . RBC  12/05/2014 4.37  3.70 - 5.45 10e6/uL Final  . RDW 12/05/2014 13.1  11.2 - 14.5 % Final  . lymph# 12/05/2014 1.0  0.9 - 3.3 10e3/uL Final  . MONO# 12/05/2014 0.0* 0.1 - 0.9 10e3/uL Final  . Eosinophils Absolute 12/05/2014 0.0  0.0 - 0.5 10e3/uL Final  . Basophils Absolute 12/05/2014 0.0  0.0 - 0.1 10e3/uL Final  . NEUT% 12/05/2014 89.7* 38.4 - 76.8 % Final  . LYMPH% 12/05/2014 9.8* 14.0 - 49.7 % Final  . MONO% 12/05/2014 0.4  0.0 - 14.0 % Final  . EOS% 12/05/2014 0.0  0.0 - 7.0 % Final  . BASO% 12/05/2014 0.1  0.0 - 2.0 % Final  . Sodium 12/05/2014 137  136 - 145 mEq/L Final  . Potassium 12/05/2014 4.0  3.5 - 5.1 mEq/L Final  . Chloride 12/05/2014 103  98 - 109 mEq/L Final  . CO2 12/05/2014 23  22 - 29 mEq/L Final  . Glucose 12/05/2014 237* 70 - 140 mg/dl Final  . BUN 12/05/2014 10.8  7.0 - 26.0 mg/dL Final  . Creatinine 12/05/2014 0.8  0.6 - 1.1 mg/dL Final  . Total Bilirubin 12/05/2014 0.68  0.20 - 1.20 mg/dL Final  . Alkaline Phosphatase 12/05/2014 54  40 - 150 U/L Final  . AST 12/05/2014 14  5 - 34 U/L Final  . ALT 12/05/2014 18  0 - 55 U/L Final  . Total Protein 12/05/2014 7.6  6.4 - 8.3 g/dL Final  . Albumin 12/05/2014 4.1  3.5 - 5.0 g/dL Final  . Calcium 12/05/2014 9.7  8.4 - 10.4 mg/dL Final  . Anion Gap 12/05/2014 11  3 - 11 mEq/L Final  . EGFR 12/05/2014 86* >90 ml/min/1.73 m2 Final   eGFR is calculated using the CKD-EPI Creatinine Equation (  2009)  Hospital Outpatient Visit on 12/04/2014  Component Date Value Ref Range Status  . WBC 12/04/2014 9.4  4.0 - 10.5 K/uL Final  . RBC 12/04/2014 4.22  3.87 - 5.11 MIL/uL Final  . Hemoglobin 12/04/2014 12.7  12.0 - 15.0 g/dL Final  . HCT 12/04/2014 38.0  36.0 - 46.0 % Final  . MCV 12/04/2014 90.0  78.0 - 100.0 fL Final  . MCH 12/04/2014 30.1  26.0 - 34.0 pg Final  . MCHC 12/04/2014 33.4  30.0 - 36.0 g/dL Final  . RDW 12/04/2014 12.6  11.5 - 15.5 % Final  . Platelets 12/04/2014 304  150 - 400 K/uL Final  . Neutrophils Relative %  12/04/2014 64  43 - 77 % Final  . Neutro Abs 12/04/2014 6.1  1.7 - 7.7 K/uL Final  . Lymphocytes Relative 12/04/2014 27  12 - 46 % Final  . Lymphs Abs 12/04/2014 2.5  0.7 - 4.0 K/uL Final  . Monocytes Relative 12/04/2014 6  3 - 12 % Final  . Monocytes Absolute 12/04/2014 0.6  0.1 - 1.0 K/uL Final  . Eosinophils Relative 12/04/2014 3  0 - 5 % Final  . Eosinophils Absolute 12/04/2014 0.2  0.0 - 0.7 K/uL Final  . Basophils Relative 12/04/2014 0  0 - 1 % Final  . Basophils Absolute 12/04/2014 0.0  0.0 - 0.1 K/uL Final  . Prothrombin Time 12/04/2014 13.3  11.6 - 15.2 seconds Final  . 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  EXAM: IR RIGHT FLOURO GUIDE CV LINE; IR ULTRASOUND GUIDANCE VASC ACCESS RIGHT  Date: 12/04/2014  ANESTHESIA/SEDATION: Moderate (conscious) sedation was administered during this procedure. A total of 8.0 mg Versed and 150 mg Fentanyl were administered intravenously. The patient's vital signs were monitored continuously by radiology nursing throughout the course of the procedure.  Total sedation time: 26 minutes  FLUOROSCOPY TIME:  12 seconds  TECHNIQUE: The procedure, risks, benefits, and alternatives were explained to the patient. Questions regarding the procedure were encouraged and answered. The patient understands and consents to the procedure.  Ultrasound survey was performed with images stored and sent to PACs.  The right neck and chest was prepped with chlorhexidine, and draped in the usual sterile fashion using maximum barrier technique (cap and mask, sterile gown, sterile gloves, large sterile sheet, hand hygiene and cutaneous antiseptic). Antibiotic prophylaxis was provided with 2.0g Ancef administered IV one hour prior to skin incision. Local anesthesia was attained by infiltration with 1% lidocaine with  epinephrine.  Ultrasound demonstrated patency of the right internal jugular vein, and this was documented with an image. Under real-time ultrasound guidance, this vein was accessed with a 21 gauge micropuncture needle and image documentation was performed. A small dermatotomy was made at the access site with an 11 scalpel. A 0.018" wire was advanced into the SVC and the access needle exchanged for a 50F micropuncture vascular sheath. The 0.018" wire was then removed and a 0.035" wire advanced into the IVC.  An appropriate location for the subcutaneous reservoir was selected below the clavicle and an incision was made through the skin and underlying soft tissues. The subcutaneous tissues were then dissected using a combination of blunt and sharp surgical technique and a pocket was formed. A single lumen power injectable portacatheter was then tunneled through the subcutaneous  tissues from the pocket to the dermatotomy and the port reservoir placed within the subcutaneous pocket.  The venous access site was then serially dilated and a peel away vascular sheath placed over the wire. The wire was removed and the port catheter advanced into position under fluoroscopic guidance. The catheter tip is positioned in the cavoatrial junction. This was documented with a spot image. The portacatheter was then tested and found to flush and aspirate well. The port was flushed with saline followed by 100 units/mL heparinized saline.  The pocket was then closed in two layers using first subdermal inverted interrupted absorbable sutures followed by a running subcuticular suture. The epidermis was then sealed with Dermabond. The dermatotomy at the venous access site was also sealed with Dermabond.  COMPLICATIONS: None  IMPRESSION: Status post right IJ port catheter placement. Catheter ready for use.  Signed,  Dulcy Fanny. Earleen Newport, DO  [facility]  [facility]   Electronically Signed   By: Corrie Mckusick D.O.   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  EXAM: IR RIGHT FLOURO GUIDE CV LINE; IR ULTRASOUND GUIDANCE VASC ACCESS RIGHT  Date: 12/04/2014  ANESTHESIA/SEDATION: Moderate (conscious) sedation was administered during this procedure. A total of 8.0 mg Versed and 150 mg Fentanyl were administered intravenously. The patient's vital signs were monitored continuously by radiology nursing throughout the course of the procedure.  Total sedation time: 26 minutes  FLUOROSCOPY TIME:  12 seconds  TECHNIQUE: The procedure, risks, benefits, and alternatives were explained to the patient. Questions regarding the procedure were encouraged and answered. The patient understands and consents to the procedure.  Ultrasound survey was performed with images stored and sent to PACs.  The right neck and chest was prepped with chlorhexidine, and draped in the usual sterile fashion using maximum barrier technique (cap and mask, sterile gown, sterile gloves, large sterile sheet, hand hygiene and cutaneous antiseptic). Antibiotic prophylaxis was provided with 2.0g Ancef administered IV one hour prior to skin incision. Local anesthesia was attained by infiltration with 1% lidocaine with epinephrine.  Ultrasound demonstrated patency of the right internal jugular vein, and this was documented with an image. Under real-time ultrasound guidance, this vein was accessed with a 21 gauge micropuncture needle and image documentation was performed. A small dermatotomy was made at the access site with an 11 scalpel. A 0.018" wire was advanced into the SVC and the access needle exchanged for a 64F micropuncture vascular sheath. The 0.018" wire was then removed and a 0.035" wire advanced into the IVC.  An appropriate location for the subcutaneous reservoir was selected below the clavicle and an incision was made through  the skin and underlying soft tissues. The subcutaneous tissues were then dissected using a combination of blunt and sharp surgical technique and a pocket was formed. A single lumen power injectable portacatheter was then tunneled through the subcutaneous tissues from the pocket to the dermatotomy and the port reservoir placed within the subcutaneous pocket.  The venous access site was then serially dilated and a peel away vascular sheath placed over the wire. The wire was removed and the port catheter advanced into position under fluoroscopic guidance. The catheter tip is positioned in the cavoatrial junction. This was documented with a spot image. The portacatheter was then tested and found to flush and aspirate well. The port was flushed with  saline followed by 100 units/mL heparinized saline.  The pocket was then closed in two layers using first subdermal inverted interrupted absorbable sutures followed by a running subcuticular suture. The epidermis was then sealed with Dermabond. The dermatotomy at the venous access site was also sealed with Dermabond.  COMPLICATIONS: None  IMPRESSION: Status post right IJ port catheter placement. Catheter ready for use.  Signed,  Dulcy Fanny. Earleen Newport, DO  [facility]  [facility]   Electronically Signed   By: Corrie Mckusick D.O.   On: 12/04/2014 14:47    ASSESSMENT/PLAN:    Hypersensitivity reaction Patient had received approximately 5-10 minutes of her initial Taxol chemotherapy infusion when she developed severe pain from her bilateral knees up towards her back.  She also was increasingly anxious and tearful as well.  Confirmed the patient did receive premedications of both Zofran 16 mg, and dexamethasone 20 mg.  Patient states that she also took Ativan 1 mg tablet from her home supply of medications.  Taxol infusion was held; and patient received Benadryl 50 mg, Pepcid 20 mg, Solu-Medrol 125 mg, and Ativan 0.5 mg IV.  All  symptoms did eventually resolve; and patient was able to proceed with the rest of her chemotherapy as directed.    Ovarian cancer, bilateral Patient has resided diagnosed with bilateral ovarian cancer; and presented to the [facility] today to receive her first cycle of Taxol/carboplatin chemotherapy regimen.  She did develop a significant reaction within the first few minutes of her Taxol infusion; with a primary complaint of severe pain from her knees up to her back.  She was also very anxious as well.  Hypersensitivity reaction was managed per hypersensitivity protocol; and all of patient's symptoms did resolve.  Patient was able to complete all of her chemotherapy as directed today.  Patient has plans to return on 12/15/2014 for labs and a follow-up visit.  Patient stated understanding of all instructions; and was in agreement with this plan of care. The patient knows to call the clinic with any problems, questions or concerns.   Review/collaboration with Dr. Marko Plume regarding all aspects of patient's visit today.   Total time spent with patient was 40 minutes;  with greater than 75 percent of that time spent in face to face counseling regarding patient's symptoms,  and coordination of care and follow up.  Disclaimer: This note was dictated with voice recognition software. Similar sounding words can inadvertently be transcribed and may not be corrected upon review.   Drue Second, NP 12/05/2014

## 2014-12-05 NOTE — Assessment & Plan Note (Signed)
Patient had received approximately 5-10 minutes of her initial Taxol chemotherapy infusion when she developed severe pain from her bilateral knees up towards her back.  She also was increasingly anxious and tearful as well.  Confirmed the patient did receive premedications of both Zofran 16 mg, and dexamethasone 20 mg.  Patient states that she also took Ativan 1 mg tablet from her home supply of medications.  Taxol infusion was held; and patient received Benadryl 50 mg, Pepcid 20 mg, Solu-Medrol 125 mg, and Ativan 0.5 mg IV.  All symptoms did eventually resolve; and patient was able to proceed with the rest of her chemotherapy as directed.

## 2014-12-06 ENCOUNTER — Other Ambulatory Visit: Payer: Self-pay | Admitting: Oncology

## 2014-12-08 ENCOUNTER — Emergency Department (HOSPITAL_COMMUNITY)
Admission: EM | Admit: 2014-12-08 | Discharge: 2014-12-08 | Disposition: A | Discharge status: Home / Self Care | Payer: BLUE CROSS/BLUE SHIELD | Attending: Emergency Medicine | Admitting: Emergency Medicine

## 2014-12-08 ENCOUNTER — Encounter (HOSPITAL_COMMUNITY): Payer: Self-pay | Admitting: Emergency Medicine

## 2014-12-08 ENCOUNTER — Telehealth: Payer: Self-pay

## 2014-12-08 DIAGNOSIS — Z8742 Personal history of other diseases of the female genital tract: Secondary | ICD-10-CM | POA: Insufficient documentation

## 2014-12-08 DIAGNOSIS — R51 Headache: Secondary | ICD-10-CM | POA: Diagnosis present

## 2014-12-08 DIAGNOSIS — E079 Disorder of thyroid, unspecified: Secondary | ICD-10-CM | POA: Insufficient documentation

## 2014-12-08 DIAGNOSIS — Z792 Long term (current) use of antibiotics: Secondary | ICD-10-CM | POA: Diagnosis not present

## 2014-12-08 DIAGNOSIS — M791 Myalgia, unspecified site: Secondary | ICD-10-CM

## 2014-12-08 DIAGNOSIS — M2550 Pain in unspecified joint: Secondary | ICD-10-CM | POA: Insufficient documentation

## 2014-12-08 DIAGNOSIS — F419 Anxiety disorder, unspecified: Secondary | ICD-10-CM | POA: Diagnosis not present

## 2014-12-08 DIAGNOSIS — R112 Nausea with vomiting, unspecified: Secondary | ICD-10-CM | POA: Diagnosis not present

## 2014-12-08 DIAGNOSIS — R079 Chest pain, unspecified: Secondary | ICD-10-CM | POA: Diagnosis not present

## 2014-12-08 DIAGNOSIS — Z8543 Personal history of malignant neoplasm of ovary: Secondary | ICD-10-CM | POA: Insufficient documentation

## 2014-12-08 DIAGNOSIS — Z79899 Other long term (current) drug therapy: Secondary | ICD-10-CM | POA: Diagnosis not present

## 2014-12-08 HISTORY — DX: Disorder of thyroid, unspecified: E07.9

## 2014-12-08 LAB — CBC WITH DIFFERENTIAL/PLATELET
BASOS PCT: 0 % (ref 0–1)
Basophils Absolute: 0 10*3/uL (ref 0.0–0.1)
Eosinophils Absolute: 0.1 10*3/uL (ref 0.0–0.7)
Eosinophils Relative: 1 % (ref 0–5)
HEMATOCRIT: 36.7 % (ref 36.0–46.0)
Hemoglobin: 12.2 g/dL (ref 12.0–15.0)
Lymphocytes Relative: 34 % (ref 12–46)
Lymphs Abs: 2.3 10*3/uL (ref 0.7–4.0)
MCH: 29.7 pg (ref 26.0–34.0)
MCHC: 33.2 g/dL (ref 30.0–36.0)
MCV: 89.3 fL (ref 78.0–100.0)
Monocytes Absolute: 0.1 10*3/uL (ref 0.1–1.0)
Monocytes Relative: 2 % — ABNORMAL LOW (ref 3–12)
Neutro Abs: 4.4 10*3/uL (ref 1.7–7.7)
Neutrophils Relative %: 63 % (ref 43–77)
Platelets: 219 10*3/uL (ref 150–400)
RBC: 4.11 MIL/uL (ref 3.87–5.11)
RDW: 12.4 % (ref 11.5–15.5)
WBC: 6.9 10*3/uL (ref 4.0–10.5)

## 2014-12-08 LAB — COMPREHENSIVE METABOLIC PANEL
ALT: 28 U/L (ref 14–54)
AST: 20 U/L (ref 15–41)
Albumin: 3.8 g/dL (ref 3.5–5.0)
Alkaline Phosphatase: 38 U/L (ref 38–126)
Anion gap: 8 (ref 5–15)
BILIRUBIN TOTAL: 1.6 mg/dL — AB (ref 0.3–1.2)
BUN: 16 mg/dL (ref 6–20)
CHLORIDE: 101 mmol/L (ref 101–111)
CO2: 28 mmol/L (ref 22–32)
Calcium: 8.6 mg/dL — ABNORMAL LOW (ref 8.9–10.3)
Creatinine, Ser: 0.76 mg/dL (ref 0.44–1.00)
Glucose, Bld: 94 mg/dL (ref 65–99)
Potassium: 3.9 mmol/L (ref 3.5–5.1)
Sodium: 137 mmol/L (ref 135–145)
TOTAL PROTEIN: 6.5 g/dL (ref 6.5–8.1)

## 2014-12-08 LAB — CK: Total CK: 34 U/L — ABNORMAL LOW (ref 38–234)

## 2014-12-08 MED ORDER — FENTANYL CITRATE (PF) 100 MCG/2ML IJ SOLN
100.0000 ug | Freq: Once | INTRAMUSCULAR | Status: AC
  Administered 2014-12-08: 100 ug via INTRAVENOUS
  Filled 2014-12-08: qty 2

## 2014-12-08 MED ORDER — SODIUM CHLORIDE 0.9 % IV BOLUS (SEPSIS)
1000.0000 mL | Freq: Once | INTRAVENOUS | Status: AC
  Administered 2014-12-08: 1000 mL via INTRAVENOUS

## 2014-12-08 MED ORDER — ONDANSETRON HCL 4 MG/2ML IJ SOLN
4.0000 mg | Freq: Once | INTRAMUSCULAR | Status: AC
  Administered 2014-12-08: 4 mg via INTRAVENOUS
  Filled 2014-12-08: qty 2

## 2014-12-08 MED ORDER — HEPARIN SOD (PORK) LOCK FLUSH 100 UNIT/ML IV SOLN
500.0000 [IU] | Freq: Once | INTRAVENOUS | Status: AC
  Administered 2014-12-08: 500 [IU]
  Filled 2014-12-08: qty 5

## 2014-12-08 MED ORDER — HYDROMORPHONE HCL 2 MG PO TABS
2.0000 mg | ORAL_TABLET | Freq: Four times a day (QID) | ORAL | Status: DC | PRN
Start: 2014-12-08 — End: 2014-12-09

## 2014-12-08 MED ORDER — HYDROMORPHONE HCL 1 MG/ML IJ SOLN
1.0000 mg | Freq: Once | INTRAMUSCULAR | Status: AC
  Administered 2014-12-08: 1 mg via INTRAVENOUS
  Filled 2014-12-08: qty 1

## 2014-12-08 NOTE — Telephone Encounter (Signed)
[name] called at 1600 stating that the aches in her legs from the taxol was a 6/10.  This is not an acceptable pain level for her. She took a 2 mg Dilaudid tab at 1500. [name] has been using the claritin 10 mg daily, she tried a warm bath and heating pad with minimal effect.   She went to the ED this am.  She received Dilaudid 1 mg IV at 0851.  [name] states that her pain went from an 8/10 to 0/10 in the ED. Stated that the taxol aches can be severe and the pain medication can take the edge off pain.  The discomfort can be for 3-4 days following the treatment. Nausea has been managed at home with prescribed antiemetics.  She woke up this am and vomited x1.  Received Zofran 4 mg IV in ED.  Pt. States that the nausea is under control. Bowels are moving fine. Reviewed above with Sara Lesser NP.   [name] stated that [name] can take the 2 mg of dilaudid tablets every 4 hours as needed this evening until she is seen in am in the [facility]om Management [facility].  Appointment given for 1000 12-09-14 Arriving at 0930 to be checked in. Encouraged [name] to use heating pad and or soak in tub this evening ~90 minutes after taking the Dilaudid at 1700.  [name] verbalized understanding.

## 2014-12-08 NOTE — ED Notes (Signed)
Pt c/o "bone/joint" pain that started last night.  Pt states that she vomited once this am.  Pt states that she called the on-call nurse's line for cancer center and was advised to come in to ED.  Pt states that her first chemo treatment was on Friday and had a port placed on Thursday.

## 2014-12-08 NOTE — Telephone Encounter (Signed)
Patient Demographics     Patient Name Sex DOB SSN Address Phone    [name], [place] Female Aug 14, 1970 [id number] [place] [address] [phone] [facility]) [phone] (Work)      Dr. Marko Plume chemo followup   Due: Today  Received: 3 days ago    [place], RN  P Onc Triage Nurse [facility]           First time Taxol/Carbo. Reacted to Taxol with severe leg pain. Was able to complete treatment.

## 2014-12-08 NOTE — ED Provider Notes (Signed)
CSN: [id number]     Arrival date & time 12/08/14  0802 History   First MD Initiated Contact with Patient 12/08/14 [phone]     Chief Complaint  Patient presents with  . body pains   . Emesis     (Consider location/radiation/quality/duration/timing/severity/associated sxs/prior Treatment) HPI 44 year old female presents with diffuse body aches and bone pain since last night. Received her first chemotherapy with Taxol 3 days ago. Had similar reaction while being infused the Taxol, was abated by being given Benadryl, steroid-induced, and Ativan. Has been having nausea since last night and vomited once when she tried to drink boost. The patient states the pain is worse in her lower extremities and is bilateral. There is no joint swelling and she denies any injuries. She has not had any fevers. No headache, cough, shortness of breath, or chest pain.  Past Medical History  Diagnosis Date  . Anxiety   . Endometriosis   . Ovarian cancer, bilateral 11/28/2014  . Thyroid disease    Past Surgical History  Procedure Laterality Date  . Abdominal hysterectomy  11/10/14    at [facility], Exp lap, supracervical hyst, BSO, infracolic omentectomy, lymphadenectomy, aortic lymph node sampling   Family History  Problem Relation Age of Onset  . Hypertension Mother   . Diabetes Mother    History  Substance Use Topics  . Smoking status: Never Smoker   . Smokeless tobacco: Not on file  . Alcohol Use: Yes     Comment: occassionally   OB History    No data available     Review of Systems  Constitutional: Negative for fever.  Respiratory: Negative for shortness of breath.   Cardiovascular: Negative for chest pain.  Gastrointestinal: Positive for nausea and vomiting. Negative for abdominal pain.  Musculoskeletal: Positive for myalgias and arthralgias. Negative for joint swelling.  Neurological: Negative for weakness and numbness.  All other systems reviewed and are negative.     Allergies  Moxifloxacin;  Quinolones; Doxycycline; Escitalopram oxalate; and Sulfonamide derivatives  Home Medications   Prior to Admission medications   Medication Sig Start Date End Date Taking? Authorizing Provider  albuterol (PROVENTIL HFA;VENTOLIN HFA) 108 (90 BASE) MCG/ACT inhaler Inhale 2 puffs into the lungs every 6 (six) hours as needed for wheezing or shortness of breath. 06/24/14  Yes Biagio Borg, MD  ALPRAZolam Duanne Moron) 1 MG tablet 1/2 tab by mouth twice per day as needed Patient taking differently: Take 0.5 mg by mouth 2 (two) times daily as needed for anxiety.  06/24/14  Yes Biagio Borg, MD  PRESCRIPTION MEDICATION Chemo chcc   Yes Historical Provider, MD  aspirin-acetaminophen-caffeine (EXCEDRIN MIGRAINE) [id number] MG per tablet Take 2 tablets by mouth 2 (two) times daily as needed for headache or migraine.    Historical Provider, MD  cephALEXin (KEFLEX) 500 MG capsule Take 1 capsule (500 mg total) by mouth 4 (four) times daily. Patient not taking: Reported on 12/04/2014 11/21/14   Dorothyann Gibbs, NP  cephALEXin (KEFLEX) 500 MG capsule Take 500 mg every 6 hrs for additional 5 days. Please try to take by these instructions 11/28/14   Gordy Levan, MD  citalopram (CELEXA) 40 MG tablet Take 1 tablet (40 mg total) by mouth daily. 01/02/14   Biagio Borg, MD  Cyanocobalamin (VITAMIN B-12 PO) Take 2 tablets by mouth daily.    Historical Provider, MD  cyclobenzaprine (FLEXERIL) 5 MG tablet Take 1 tablet (5 mg total) by mouth 3 (three) times daily as needed for muscle spasms.  01/02/14   Biagio Borg, MD  dexamethasone (DECADRON) 4 MG tablet Take 5 tablets with food 12 hrs and 6 hrs prior to Taxol chemotherapy 11/28/14   Lennis Marion Downer, MD  diclofenac sodium (VOLTAREN) 1 % GEL Use topically four times per day as needed for pain Patient taking differently: Apply 1 application topically 2 (two) times daily as needed (knee pain).  06/24/14   Biagio Borg, MD  enoxaparin (LOVENOX) 40 MG/0.4ML injection Inject 40 mg into  the skin daily.  11/13/14 12/11/14  Historical Provider, MD  fluconazole (DIFLUCAN) 100 MG tablet Take 1 tablet (100 mg total) by mouth daily. 12/02/14   Lennis Marion Downer, MD  gabapentin (NEURONTIN) 300 MG capsule Take 300 mg by mouth 3 (three) times daily.  11/13/14 11/13/15  Historical Provider, MD  hydrochlorothiazide (HYDRODIURIL) 25 MG tablet 1/2 tab by mouth per day as needed for swelling Patient taking differently: Take 12.5 mg by mouth as needed (for swelling).  01/02/14   Biagio Borg, MD  HYDROcodone-acetaminophen (NORCO/VICODIN) 5-325 MG per tablet Take 1-2 tablets by mouth every 4 (four) hours as needed. 11/25/14   Historical Provider, MD  ibuprofen (ADVIL,MOTRIN) 200 MG tablet Take 400-800 mg by mouth 2 (two) times daily as needed (knee pain).    Historical Provider, MD  levothyroxine (LEVOTHROID) 25 MCG tablet Take 1 tablet (25 mcg total) by mouth daily before breakfast. 09/10/14   Biagio Borg, MD  lidocaine-prilocaine (EMLA) cream Apply 1-2 hrs prior to [facility] access Patient not taking: Reported on 12/04/2014 11/28/14   Lennis Marion Downer, MD  LORazepam (ATIVAN) 1 MG tablet Place 1/2 -1 tablet under the tongue or swallow every 6 hours as needed for nausea. Will make drowsy / forgetful around each dose. Patient not taking: Reported on 12/04/2014 11/28/14   Gordy Levan, MD  naproxen (NAPROSYN) 500 MG tablet Take 1 tablet (500 mg total) by mouth 2 (two) times daily with a meal. As needed for pain Patient not taking: Reported on 11/21/2014 02/14/14   Biagio Borg, MD  ondansetron (ZOFRAN) 8 MG tablet Take 1 tablet (8 mg total) by mouth every 8 (eight) hours as needed for nausea or vomiting. Will not make drowsy. Patient not taking: Reported on 12/04/2014 11/28/14   Gordy Levan, MD  pantoprazole (PROTONIX) 40 MG tablet Take 1 tablet (40 mg total) by mouth daily. Patient not taking: Reported on 12/04/2014 11/28/14   Gordy Levan, MD  sennosides-docusate sodium (SENOKOT-S) 8.6-50 MG tablet Take 1  tablet by mouth daily.  11/13/14 11/13/15  Historical Provider, MD  simethicone (MYLICON) 80 MG chewable tablet Chew 80 mg by mouth as needed.  11/13/14   Historical Provider, MD  SUMAtriptan (IMITREX) 100 MG tablet Take 1 tablet (100 mg total) by mouth every 2 (two) hours as needed for migraine or headache. May repeat in 2 hours if headache persists or recurs. 01/02/14   Biagio Borg, MD  triamcinolone cream (KENALOG) 0.1 % Apply 1 application topically 2 (two) times daily. Patient taking differently: Apply 1 application topically daily at 12 noon. For psoriasis 01/02/14   Biagio Borg, MD   BP 112/79 mmHg  Pulse 76  Temp(Src) 98.1 F (36.7 C) (Oral)  Resp 18  SpO2 98%  LMP 11/02/2014 Physical Exam  Constitutional: She is oriented to person, place, and time. She appears well-developed and well-nourished. No distress.  HENT:  Head: Normocephalic and atraumatic.  Right Ear: External ear normal.  Left Ear: External ear  normal.  Nose: Nose normal.  Eyes: Right eye exhibits no discharge. Left eye exhibits no discharge.  Cardiovascular: Normal rate, regular rhythm and normal heart sounds.   Pulmonary/Chest: Effort normal and breath sounds normal.  Mild tenderness around right chest port, seems appropriate given recent placement. No erythema, swelling or fluctuance  Abdominal: Soft. She exhibits no distension. There is no tenderness.  Musculoskeletal:  No focal joint tenderness or swelling. Normal ROM of all major joints. No significant muscle tenderness  Neurological: She is alert and oriented to person, place, and time.  Skin: Skin is warm and dry. She is not diaphoretic.  Nursing note and vitals reviewed.   ED Course  Procedures (including critical care time) Labs Review Labs Reviewed  COMPREHENSIVE METABOLIC PANEL - Abnormal; Notable for the following:    Calcium 8.6 (*)    Total Bilirubin 1.6 (*)    All other components within normal limits  CBC WITH DIFFERENTIAL/PLATELET - Abnormal;  Notable for the following:    Monocytes Relative 2 (*)    All other components within normal limits  CK - Abnormal; Notable for the following:    Total CK 34 (*)    All other components within normal limits    Imaging Review No results found.   EKG Interpretation None      MDM   Final diagnoses:  Myalgia    Patient's lab work is unremarkable including no evidence of rhabdomyolysis. She is still in pain but is significantly improved with IV pain medicine. Most likely this is a side effect of her Taxol treatment. Patient has no evidence of joint swelling or concern for infection. Discussed with the oncologist on call, Dr. Earlie Server, who recommends a short course of Dilaudid 2 mg q 6 and f/u with her oncologist, Dr. Marko Plume.    Sherwood Gambler, MD 12/08/14 1020

## 2014-12-09 ENCOUNTER — Encounter: Payer: Self-pay | Admitting: Nurse Practitioner

## 2014-12-09 ENCOUNTER — Ambulatory Visit (HOSPITAL_BASED_OUTPATIENT_CLINIC_OR_DEPARTMENT_OTHER): Payer: BLUE CROSS/BLUE SHIELD | Admitting: Nurse Practitioner

## 2014-12-09 VITALS — BP 116/69 | HR 101 | Temp 98.6°F | Resp 14 | Wt 284.9 lb

## 2014-12-09 DIAGNOSIS — C563 Malignant neoplasm of bilateral ovaries: Secondary | ICD-10-CM

## 2014-12-09 DIAGNOSIS — C561 Malignant neoplasm of right ovary: Secondary | ICD-10-CM

## 2014-12-09 DIAGNOSIS — M791 Myalgia, unspecified site: Secondary | ICD-10-CM | POA: Insufficient documentation

## 2014-12-09 DIAGNOSIS — C562 Malignant neoplasm of left ovary: Secondary | ICD-10-CM | POA: Diagnosis not present

## 2014-12-09 MED ORDER — HYDROMORPHONE HCL 4 MG PO TABS: ORAL_TABLET | ORAL | Status: DC

## 2014-12-09 MED ORDER — HYDROMORPHONE HCL 4 MG/ML IJ SOLN
INTRAMUSCULAR | Status: AC
  Filled 2014-12-09: qty 1

## 2014-12-09 MED ORDER — HYDROMORPHONE HCL 4 MG/ML IJ SOLN
2.0000 mg | Freq: Once | INTRAMUSCULAR | Status: AC
  Administered 2014-12-09: 2 mg via INTRAMUSCULAR

## 2014-12-09 NOTE — Assessment & Plan Note (Deleted)
Patient received her first cycle of carboplatin/Taxol chemotherapy on 12/05/2014.  Patient has been complaining of significant generalized achiness; with a specific increased myalgia pain to her low back down to her bilateral knees since receiving her chemotherapy.  Patient actually presented to the emergency department yesterday morning.  12/08/2014 for the same complaints.  Patient continues to complain of pain despite Dilaudid on an as needed basis.  Patient also has experienced some chronic nausea; and has vomited once.  Patient is scheduled to return for labs and a follow-up visit on Monday, 12/15/2014.  Advised patient may need to consider decreasing the dose of the Taxol with her next cycle of chemotherapy.

## 2014-12-09 NOTE — Progress Notes (Signed)
SYMPTOM MANAGEMENT CLINIC   HPI: [name] 44 y.o. female diagnosed with bilateral ovarian cancer.    Patient received her first cycle of carboplatin/Taxol chemotherapy on 12/05/2014.  Patient has been complaining of significant generalized achiness; with a specific increased myalgia pain to her low back down to her bilateral knees since receiving her chemotherapy.  Patient actually presented to the emergency department yesterday morning 12/08/2014 for the same complaints.  She received Dilaudid 1 mg IV and Fentanyl 100 g IV before she obtained some pain relief.  She was prescribed Dilaudid 2 mg tablets to take at home on an as-needed basis.  Patient called the cancer Center yesterday late afternoon once again complaining of significant myalgias to her lower back to her bilateral knees.  She denied any other new symptoms whatsoever.  She denied any recent fevers or chills.  HPI  ROS  Past Medical History  Diagnosis Date  . Anxiety   . Endometriosis   . Ovarian cancer, bilateral 11/28/2014  . Thyroid disease     Past Surgical History  Procedure Laterality Date  . Abdominal hysterectomy  11/10/14    at [facility], Exp lap, supracervical hyst, BSO, infracolic omentectomy, lymphadenectomy, aortic lymph node sampling    has OBESITY, MORBID; Anxiety state; DEPRESSION; MIGRAINE HEADACHE; Unspecified otitis media; EAR PAIN, BILATERAL; Acute sinusitis; ALLERGIC RHINITIS; Acute bronchospasm; GERD; ENDOMETRIOSIS; INGROWN TOENAIL; KNEE PAIN, RIGHT; BACK PAIN; Insomnia; RASH-NONVESICULAR; Edema; Lower back pain; Dyspnea; Right knee pain; Wellness examination; Hypothyroidism; Hypersomnolence; Postoperative cellulitis of surgical wound; Ovarian cancer, bilateral; Dysuria; Poor venous access; Morbid obesity with BMI of 40.0-44.9, adult; Hypersensitivity reaction; and Myalgia on her problem list.    is allergic to moxifloxacin; quinolones; doxycycline; escitalopram oxalate; and sulfonamide  derivatives.    Medication List       This list is accurate as of: 12/09/14  3:37 PM.  Always use your most recent med list.               albuterol 108 (90 BASE) MCG/ACT inhaler  Commonly known as:  PROVENTIL HFA;VENTOLIN HFA  Inhale 2 puffs into the lungs every 6 (six) hours as needed for wheezing or shortness of breath.     ALPRAZolam 1 MG tablet  Commonly known as:  XANAX  1/2 tab by mouth twice per day as needed     aspirin-acetaminophen-caffeine 250-250-65 MG per tablet  Commonly known as:  EXCEDRIN MIGRAINE  Take 2 tablets by mouth 2 (two) times daily as needed for headache or migraine.     citalopram 40 MG tablet  Commonly known as:  CELEXA  Take 1 tablet (40 mg total) by mouth daily.     cyclobenzaprine 5 MG tablet  Commonly known as:  FLEXERIL  Take 1 tablet (5 mg total) by mouth 3 (three) times daily as needed for muscle spasms.     dexamethasone 4 MG tablet  Commonly known as:  DECADRON  Take 5 tablets with food 12 hrs and 6 hrs prior to Taxol chemotherapy     diclofenac sodium 1 % Gel  Commonly known as:  VOLTAREN  Use topically four times per day as needed for pain     enoxaparin 40 MG/0.4ML injection  Commonly known as:  LOVENOX  Inject 40 mg into the skin daily.     fluconazole 100 MG tablet  Commonly known as:  DIFLUCAN  Take 1 tablet (100 mg total) by mouth daily.     gabapentin 300 MG capsule  Commonly known as:  NEURONTIN  Take 300 mg by mouth 3 (three) times daily.     hydrochlorothiazide 25 MG tablet  Commonly known as:  HYDRODIURIL  1/2 tab by mouth per day as needed for swelling     HYDROcodone-acetaminophen 5-325 MG per tablet  Commonly known as:  NORCO/VICODIN  Take 1-2 tablets by mouth every 4 (four) hours as needed for moderate pain.     HYDROmorphone 4 MG tablet  Commonly known as:  DILAUDID  Take 1/2 to 1 whole tab (2 mg to 4 mg) Q 4-6 hours PRN severe pain.     levothyroxine 25 MCG tablet  Commonly known as:  LEVOTHROID    Take 1 tablet (25 mcg total) by mouth daily before breakfast.     lidocaine-prilocaine cream  Commonly known as:  EMLA  Apply 1-2 hrs prior to Porta-Cath access     LORazepam 1 MG tablet  Commonly known as:  ATIVAN  Place 1/2 -1 tablet under the tongue or swallow every 6 hours as needed for nausea. Will make drowsy / forgetful around each dose.     naproxen 500 MG tablet  Commonly known as:  NAPROSYN  Take 1 tablet (500 mg total) by mouth 2 (two) times daily with a meal. As needed for pain     ondansetron 8 MG tablet  Commonly known as:  ZOFRAN  Take 1 tablet (8 mg total) by mouth every 8 (eight) hours as needed for nausea or vomiting. Will not make drowsy.     pantoprazole 40 MG tablet  Commonly known as:  PROTONIX  Take 1 tablet (40 mg total) by mouth daily.     PRESCRIPTION MEDICATION  Chemo chcc     SUMAtriptan 100 MG tablet  Commonly known as:  IMITREX  Take 1 tablet (100 mg total) by mouth every 2 (two) hours as needed for migraine or headache. May repeat in 2 hours if headache persists or recurs.     triamcinolone cream 0.1 %  Commonly known as:  KENALOG  Apply 1 application topically 2 (two) times daily.     VITAMIN B-12 PO  Take 2 tablets by mouth daily.         PHYSICAL EXAMINATION  Oncology Vitals 12/09/2014 12/08/2014 12/08/2014 12/08/2014 12/08/2014 12/08/2014 12/08/2014  Height - - - - - - -  Weight 129.23 kg - - - - - -  Weight (lbs) 284 lbs 14 oz - - - - - -  BMI (kg/m2) - - - - - - -  Temp 98.6 - - - - - -  Pulse 101 - 68 63 74 97 82  Resp 14 18 - - - - -  SpO2 - 97 - - - - -  BSA (m2) - - - - - - -   BP Readings from Last 3 Encounters:  12/09/14 116/69  12/08/14 102/59  12/05/14 144/78    Physical Exam  Constitutional: She is oriented to person, place, and time and well-developed, well-nourished, and in no distress.  HENT:  Head: Normocephalic and atraumatic.  Eyes: Conjunctivae and EOM are normal. Pupils are equal, round, and reactive to light.  Right eye exhibits no discharge. Left eye exhibits no discharge. No scleral icterus.  Neck: Normal range of motion. Neck supple. No JVD present. No tracheal deviation present. No thyromegaly present.  Cardiovascular: Normal rate, regular rhythm, normal heart sounds and intact distal pulses.   Pulmonary/Chest: Effort normal and breath sounds normal. No stridor. No respiratory distress. She has no wheezes. She  has no rales. She exhibits no tenderness.  Abdominal: Soft. Bowel sounds are normal. She exhibits no distension and no mass. There is no tenderness. There is no rebound and no guarding.  Musculoskeletal: Normal range of motion. She exhibits no edema or tenderness.  Lymphadenopathy:    She has no cervical adenopathy.  Neurological: She is alert and oriented to person, place, and time. Gait normal.  Skin: Skin is warm and dry. No rash noted. No erythema. No pallor.  Psychiatric: Affect normal.  Occasionally tearful..  Nursing note and vitals reviewed.   LABORATORY DATA:. Admission on 12/08/2014, Discharged on 12/08/2014  Component Date Value Ref Range Status  . Sodium 12/08/2014 137  135 - 145 mmol/L Final  . Potassium 12/08/2014 3.9  3.5 - 5.1 mmol/L Final  . Chloride 12/08/2014 101  101 - 111 mmol/L Final  . CO2 12/08/2014 28  22 - 32 mmol/L Final  . Glucose, Bld 12/08/2014 94  65 - 99 mg/dL Final  . BUN 12/08/2014 16  6 - 20 mg/dL Final  . Creatinine, Ser 12/08/2014 0.76  0.44 - 1.00 mg/dL Final  . Calcium 12/08/2014 8.6* 8.9 - 10.3 mg/dL Final  . Total Protein 12/08/2014 6.5  6.5 - 8.1 g/dL Final  . Albumin 12/08/2014 3.8  3.5 - 5.0 g/dL Final  . AST 12/08/2014 20  15 - 41 U/L Final  . ALT 12/08/2014 28  14 - 54 U/L Final  . Alkaline Phosphatase 12/08/2014 38  38 - 126 U/L Final  . Total Bilirubin 12/08/2014 1.6* 0.3 - 1.2 mg/dL Final  . GFR calc non Af Amer 12/08/2014 >60  >60 mL/min Final  . GFR calc Af Amer 12/08/2014 >60  >60 mL/min Final   Comment: (NOTE) The eGFR has  been calculated using the CKD EPI equation. This calculation has not been validated in all clinical situations. eGFR's persistently <60 mL/min signify possible Chronic Kidney Disease.   . Anion gap 12/08/2014 8  5 - 15 Final  . WBC 12/08/2014 6.9  4.0 - 10.5 K/uL Final  . RBC 12/08/2014 4.11  3.87 - 5.11 MIL/uL Final  . Hemoglobin 12/08/2014 12.2  12.0 - 15.0 g/dL Final  . HCT 12/08/2014 36.7  36.0 - 46.0 % Final  . MCV 12/08/2014 89.3  78.0 - 100.0 fL Final  . MCH 12/08/2014 29.7  26.0 - 34.0 pg Final  . MCHC 12/08/2014 33.2  30.0 - 36.0 g/dL Final  . RDW 12/08/2014 12.4  11.5 - 15.5 % Final  . Platelets 12/08/2014 219  150 - 400 K/uL Final  . Neutrophils Relative % 12/08/2014 63  43 - 77 % Final  . Neutro Abs 12/08/2014 4.4  1.7 - 7.7 K/uL Final  . Lymphocytes Relative 12/08/2014 34  12 - 46 % Final  . Lymphs Abs 12/08/2014 2.3  0.7 - 4.0 K/uL Final  . Monocytes Relative 12/08/2014 2* 3 - 12 % Final  . Monocytes Absolute 12/08/2014 0.1  0.1 - 1.0 K/uL Final  . Eosinophils Relative 12/08/2014 1  0 - 5 % Final  . Eosinophils Absolute 12/08/2014 0.1  0.0 - 0.7 K/uL Final  . Basophils Relative 12/08/2014 0  0 - 1 % Final  . Basophils Absolute 12/08/2014 0.0  0.0 - 0.1 K/uL Final  . Total CK 12/08/2014 34* 38 - 234 U/L Final     RADIOGRAPHIC STUDIES: No results found.  ASSESSMENT/PLAN:    Ovarian cancer, bilateral Patient received her first cycle of carboplatin/Taxol chemotherapy on 12/05/2014.  Patient has  been complaining of significant generalized achiness; with a specific increased myalgia pain to her low back down to her bilateral knees since receiving her chemotherapy.  Patient actually presented to the emergency department yesterday morning.  12/08/2014 for the same complaints.  Patient continues to complain of pain despite Dilaudid on an as needed basis.  Patient also has experienced some chronic nausea; and has vomited once.  Patient is scheduled to return for labs and  a follow-up visit on Monday, 12/15/2014.  Advised patient may need to consider decreasing the dose of the Taxol with her next cycle of chemotherapy.  Myalgia Patient received her first cycle of carboplatin/Taxol chemotherapy on 12/05/2014.  Patient has been complaining of significant generalized achiness; with a specific increased myalgia pain to her low back down to her bilateral knees since receiving her chemotherapy.  Patient actually presented to the emergency department yesterday morning 12/08/2014 for the same complaints.  She received Dilaudid 1 mg IV and Fentanyl 100 g IV before she obtained some pain relief.  She was prescribed Dilaudid 2 mg tablets to take at home on an as-needed basis.  Patient called the cancer Center yesterday late afternoon once again complaining of significant myalgias to her lower back to her bilateral knees.  She denied any other new symptoms whatsoever.  She denied any recent fevers or chills.  Patient was given Dilaudid 2 mg intramuscularly while at the [facility] today.  Have also increased patient's Dilaudid to 4 mg tablets.  She was instructed to try taking one half to a whole tablet on an every 4-6 hour basis as needed for control of severe pain.  Had a long discussion with the patient in regards to her history of chronic anxiety and appropriate pain control.  Patient states that she has been holding her Xanax completely; while taking the Ativan for both anxiety and chronic nausea.  Patient also states that she has taken no further hydrocodone; since she is now taking the Dilaudid tablets.  Advised patient that the goal would be to hopefully be able to wean completely off of all narcotic pain medications within this next week.  Patient stated understanding of all instructions and was in agreement with this plan of care.  Patient is scheduled to return for labs and a follow-up visit on Monday, 12/15/2014.  Advised patient may need to consider decreasing  the dose of the Taxol with her next cycle of chemotherapy.   Patient stated understanding of all instructions; and was in agreement with this plan of care. The patient knows to call the clinic with any problems, questions or concerns.   Review/collaboration with Dr. Marko Plume regarding all aspects of patient's visit today.   Total time spent with patient was 40 minutes;  with greater than 75 percent of that time spent in face to face counseling regarding patient's symptoms,  and coordination of care and follow up.  Disclaimer: This note was dictated with voice recognition software. Similar sounding words can inadvertently be transcribed and may not be corrected upon review.   Drue Second, NP 12/09/2014

## 2014-12-09 NOTE — Assessment & Plan Note (Signed)
Patient received her first cycle of carboplatin/Taxol chemotherapy on 12/05/2014.  Patient has been complaining of significant generalized achiness; with a specific increased myalgia pain to her low back down to her bilateral knees since receiving her chemotherapy.  Patient actually presented to the emergency department yesterday morning 12/08/2014 for the same complaints.  She received Dilaudid 1 mg IV and Fentanyl 100 g IV before she obtained some pain relief.  She was prescribed Dilaudid 2 mg tablets to take at home on an as-needed basis.  Patient called the cancer Center yesterday late afternoon once again complaining of significant myalgias to her lower back to her bilateral knees.  She denied any other new symptoms whatsoever.  She denied any recent fevers or chills.  Patient was given Dilaudid 2 mg intramuscularly while at the [facility] today.  Have also increased patient's Dilaudid to 4 mg tablets.  She was instructed to try taking one half to a whole tablet on an every 4-6 hour basis as needed for control of severe pain.  Had a long discussion with the patient in regards to her history of chronic anxiety and appropriate pain control.  Patient states that she has been holding her Xanax completely; while taking the Ativan for both anxiety and chronic nausea.  Patient also states that she has taken no further hydrocodone; since she is now taking the Dilaudid tablets.  Advised patient that the goal would be to hopefully be able to wean completely off of all narcotic pain medications within this next week.  Patient stated understanding of all instructions and was in agreement with this plan of care.  Patient is scheduled to return for labs and a follow-up visit on Monday, 12/15/2014.  Advised patient may need to consider decreasing the dose of the Taxol with her next cycle of chemotherapy.

## 2014-12-09 NOTE — Assessment & Plan Note (Signed)
Patient received her first cycle of carboplatin/Taxol chemotherapy on 12/05/2014.  Patient has been complaining of significant generalized achiness; with a specific increased myalgia pain to her low back down to her bilateral knees since receiving her chemotherapy.  Patient actually presented to the emergency department yesterday morning.  12/08/2014 for the same complaints.  Patient continues to complain of pain despite Dilaudid on an as needed basis.  Patient also has experienced some chronic nausea; and has vomited once.  Patient is scheduled to return for labs and a follow-up visit on Monday, 12/15/2014.  Advised patient may need to consider decreasing the dose of the Taxol with her next cycle of chemotherapy.

## 2014-12-10 ENCOUNTER — Telehealth: Payer: Self-pay

## 2014-12-10 NOTE — Telephone Encounter (Signed)
Leg pain a lot better, "it's contained", has used less dilaudid today.

## 2014-12-14 ENCOUNTER — Other Ambulatory Visit: Payer: Self-pay | Admitting: Oncology

## 2014-12-15 ENCOUNTER — Telehealth: Payer: Self-pay | Admitting: Oncology

## 2014-12-15 ENCOUNTER — Ambulatory Visit (HOSPITAL_BASED_OUTPATIENT_CLINIC_OR_DEPARTMENT_OTHER): Payer: BLUE CROSS/BLUE SHIELD

## 2014-12-15 ENCOUNTER — Other Ambulatory Visit (HOSPITAL_BASED_OUTPATIENT_CLINIC_OR_DEPARTMENT_OTHER): Payer: BLUE CROSS/BLUE SHIELD

## 2014-12-15 ENCOUNTER — Ambulatory Visit (HOSPITAL_BASED_OUTPATIENT_CLINIC_OR_DEPARTMENT_OTHER): Payer: BLUE CROSS/BLUE SHIELD | Admitting: Oncology

## 2014-12-15 ENCOUNTER — Encounter: Payer: Self-pay | Admitting: Oncology

## 2014-12-15 ENCOUNTER — Other Ambulatory Visit (HOSPITAL_BASED_OUTPATIENT_CLINIC_OR_DEPARTMENT_OTHER): Payer: BLUE CROSS/BLUE SHIELD | Admitting: *Deleted

## 2014-12-15 VITALS — BP 127/85 | HR 75 | Temp 98.8°F | Resp 18 | Ht 68.0 in | Wt 284.1 lb

## 2014-12-15 DIAGNOSIS — R300 Dysuria: Secondary | ICD-10-CM

## 2014-12-15 DIAGNOSIS — N809 Endometriosis, unspecified: Secondary | ICD-10-CM | POA: Diagnosis not present

## 2014-12-15 DIAGNOSIS — C561 Malignant neoplasm of right ovary: Secondary | ICD-10-CM

## 2014-12-15 DIAGNOSIS — Z5111 Encounter for antineoplastic chemotherapy: Secondary | ICD-10-CM

## 2014-12-15 DIAGNOSIS — C562 Malignant neoplasm of left ovary: Secondary | ICD-10-CM

## 2014-12-15 DIAGNOSIS — E8940 Asymptomatic postprocedural ovarian failure: Secondary | ICD-10-CM

## 2014-12-15 DIAGNOSIS — Z95828 Presence of other vascular implants and grafts: Secondary | ICD-10-CM

## 2014-12-15 DIAGNOSIS — C563 Malignant neoplasm of bilateral ovaries: Secondary | ICD-10-CM

## 2014-12-15 DIAGNOSIS — D701 Agranulocytosis secondary to cancer chemotherapy: Secondary | ICD-10-CM

## 2014-12-15 DIAGNOSIS — Z6841 Body Mass Index (BMI) 40.0 and over, adult: Secondary | ICD-10-CM

## 2014-12-15 DIAGNOSIS — K219 Gastro-esophageal reflux disease without esophagitis: Secondary | ICD-10-CM

## 2014-12-15 DIAGNOSIS — T451X5A Adverse effect of antineoplastic and immunosuppressive drugs, initial encounter: Secondary | ICD-10-CM

## 2014-12-15 DIAGNOSIS — E039 Hypothyroidism, unspecified: Secondary | ICD-10-CM

## 2014-12-15 LAB — CBC WITH DIFFERENTIAL/PLATELET
BASO%: 0.3 % (ref 0.0–2.0)
Basophils Absolute: 0 10*3/uL (ref 0.0–0.1)
EOS%: 1.6 % (ref 0.0–7.0)
Eosinophils Absolute: 0.1 10*3/uL (ref 0.0–0.5)
HCT: 34.5 % — ABNORMAL LOW (ref 34.8–46.6)
HEMOGLOBIN: 11.7 g/dL (ref 11.6–15.9)
LYMPH#: 1.5 10*3/uL (ref 0.9–3.3)
LYMPH%: 38.7 % (ref 14.0–49.7)
MCH: 30 pg (ref 25.1–34.0)
MCHC: 33.9 g/dL (ref 31.5–36.0)
MCV: 88.7 fL (ref 79.5–101.0)
MONO#: 0.5 10*3/uL (ref 0.1–0.9)
MONO%: 11.5 % (ref 0.0–14.0)
NEUT#: 1.9 10*3/uL (ref 1.5–6.5)
NEUT%: 47.9 % (ref 38.4–76.8)
Platelets: 333 10*3/uL (ref 145–400)
RBC: 3.89 10*6/uL (ref 3.70–5.45)
RDW: 13.1 % (ref 11.2–14.5)
WBC: 4 10*3/uL (ref 3.9–10.3)

## 2014-12-15 LAB — COMPREHENSIVE METABOLIC PANEL (CC13)
ALBUMIN: 3.9 g/dL (ref 3.5–5.0)
ALK PHOS: 49 U/L (ref 40–150)
ALT: 38 U/L (ref 0–55)
AST: 21 U/L (ref 5–34)
Anion Gap: 7 mEq/L (ref 3–11)
BILIRUBIN TOTAL: 0.95 mg/dL (ref 0.20–1.20)
BUN: 8.5 mg/dL (ref 7.0–26.0)
CALCIUM: 9.2 mg/dL (ref 8.4–10.4)
CO2: 27 meq/L (ref 22–29)
Chloride: 107 mEq/L (ref 98–109)
Creatinine: 0.7 mg/dL (ref 0.6–1.1)
EGFR: >90 mL/min/{1.73_m2} (ref >90)
GLUCOSE: 95 mg/dL (ref 70–140)
Potassium: 3.9 mEq/L (ref 3.5–5.1)
Sodium: 141 mEq/L (ref 136–145)
TOTAL PROTEIN: 6.7 g/dL (ref 6.4–8.3)

## 2014-12-15 LAB — URINALYSIS, MICROSCOPIC - CHCC
Bilirubin (Urine): NEGATIVE
Blood: NEGATIVE
GLUCOSE UR CHCC: NEGATIVE mg/dL
Ketones: NEGATIVE mg/dL
Nitrite: NEGATIVE
Protein: NEGATIVE mg/dL
RBC / HPF: NEGATIVE (ref 0–2)
SPECIFIC GRAVITY, URINE: 1.01 (ref 1.003–1.035)
UROBILINOGEN UR: 0.2 mg/dL (ref 0.2–1)
pH: 7.5 (ref 4.6–8.0)

## 2014-12-15 MED ORDER — HEPARIN SOD (PORK) LOCK FLUSH 100 UNIT/ML IV SOLN
500.0000 [IU] | Freq: Once | INTRAVENOUS | Status: AC
  Administered 2014-12-15: 500 [IU] via INTRAVENOUS
  Filled 2014-12-15: qty 5

## 2014-12-15 MED ORDER — DEXAMETHASONE 4 MG PO TABS: ORAL_TABLET | ORAL | Status: DC

## 2014-12-15 MED ORDER — LORAZEPAM 1 MG PO TABS: ORAL_TABLET | ORAL | Status: DC

## 2014-12-15 MED ORDER — SODIUM CHLORIDE 0.9 % IJ SOLN
10.0000 mL | INTRAMUSCULAR | Status: DC | PRN
  Administered 2014-12-15: 10 mL via INTRAVENOUS
  Filled 2014-12-15: qty 10

## 2014-12-15 NOTE — Progress Notes (Signed)
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 She had a difficult time with taxol aches, but is doing much better now. PAC placed by IR 12-04-14. She has not had gCSF. See MD phone note 11-28-14 if this is needed, as insurance would not preauthorize ahead.  Despite full steroid and other premeds, patient had apparent taxol reaction 5-10 min into first infusion, which was acute, severe pain from bilateral knees up to her back, with anxiety. She had no respiratory or cardiac issues per information that I can find in EMR. Symptoms resolved with usual interventions for taxol reaction + additional ativan. She completed remainder of taxol infusion without difficulty other than restless legs related to the additional benadryl given for reaction. She remembers the leg pain but little otherwise, likely due to some amnesia with the ativan. Patient had only occasional mild nausea, improved with prn ativan and zofran. Taxol aches began either day 2 or day 3, mostly in legs; I believe that she began Claritin also on day 2. She spoke with answering service ~ early AM 12-08-14 (day 4) and was instructed to go to ED, where she was given IV fentanyl and IV dilaudid with improvement, as well as prescription for 2 mg dilaudid #15. She was seen in symptom management clinic at [facility] on 12-09-14, still with significant pain in legs despite pain medication, and nausea with vomiting x 1. Dilaudid was increased to 4 mg prn, with improvement. Patient also tells me that hot shower was helpful. Aches had resolved by 12-10-14 and she felt well enough to go to beach this past weekend. Bowels moved well despite pain medication. She completed post op prophylactic lovenox. The area of tenderness and swelling  at lower surgical incision is smaller and not bothersome now. Slight dysuria. Energy is very good now and appetite fine,  "I feel great".    PAC placed by IR 12-04-14 Genetics counseling scheduled 12-17-14 Pre op CA 125   46   ONCOLOGIC HISTORY Patient had history of endometriosis and infertility, with no abnormal PAP smears but no recent gyn exams. She was in usual health until a few weeks of intermittent low grade nausea, then <24 hours of progressively more severe right upper quadrant abdominal pain. She presented to [facility] ED with the abdominal pain on 11-09-14, with abdominal US not remarkable, then CT AP demonstrated bilateral solid and cystic ovarian masses in total measuring 14 x 10 x 11 cm. She was transferred to [facility] Dr Thurston Pounds, with preoperative CA 125 of 554. On 11-10-14 she had exploratory laparotomy with supracervical hysterectomy, BSO, infracolic omentectomy, bilateral pelvic lymphadenectomy and aortic node sampling. 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 . Surgical pathology from [facility] [phone]) from 11-10-14 had grade 3 clear cell carcinoma in 6 cm right ovary and 11 cm left ovary with capsules intact, bilateral fallopian tubes not involved, uterus and omentum not involved, no LVSI, for stage IB. No malignant cells in ascites or peritoneal washings. Her postoperative course was uncomplicated. [facility] multidisciplinary conference recommended 6 cycles of adjuvant taxol carboplatin. She will have 6 week  post op visit with Dr Thurston Pounds. First carbo taxol was given 05-28-39, complicated by taxol reaction despite full premedication, and severe taxol aches.    Review of systems as above, also: No fever or symptoms  of infection. Some intermittent low back pain long-standing, tho never associated with acute bilateral leg pain as during taxol infusion. No SOB. No abdominal or pelvic pain. No LE swelling or calf tenderness. Minimal intermittent tingling tips of fingers and toes. No problems with PAC. Remainder of 10 point Review of Systems negative.  Objective:  Vital signs in last 24 hours:  BP 127/85 mmHg  Pulse 75  Temp(Src) 98.8 F (37.1 C) (Oral)  Resp 18  Ht 5' 8" (1.727 m)  Wt 284 lb 1.6 oz (128.867 kg)  BMI 43.21 kg/m2  LMP 11/02/2014 Weight stable from 8-2 Alert, oriented and appropriate. Ambulatory without difficulty.  No alopecia  HEENT:PERRL, sclerae not icteric. Oral mucosa moist without lesions, posterior pharynx clear.  Neck supple. No JVD.  Lymphatics:no cervical,supraclavicular or inguinal adenopathy Resp: clear to auscultation bilaterally and normal percussion bilaterally, decreased BS in bases consistent with habitus Cardio: regular rate and rhythm. No gallop. GI: abdomen obese, soft, nontender, not distended, cannot appreciate mass or organomegaly. Normally active bowel sounds. Surgical incision entirely closed, no tenderness or erythema. Smooth fullness deep to inferior end of incision and to left smaller than at my initial exam, not tender.  Musculoskeletal/ Extremities: without pitting edema, cords, tenderness Neuro: no peripheral neuropathy. Otherwise nonfocal. PSYCH appropriate mood and affect Skin without rash, ecchymosis, petechiae Portacath-without erythema or tenderness, good position  Lab Results:  Results for orders placed or performed in visit on 12/15/14  CBC with Differential  Result Value Ref Range   WBC 4.0 3.9 - 10.3 10e3/uL   NEUT# 1.9 1.5 - 6.5 10e3/uL   HGB 11.7 11.6 - 15.9 g/dL   HCT 34.5 (L) 34.8 - 46.6 %   Platelets 333 145 - 400 10e3/uL   MCV 88.7 79.5 - 101.0 fL   MCH 30.0 25.1 - 34.0 pg   MCHC 33.9 31.5 - 36.0 g/dL   RBC 3.89 3.70 - 5.45  10e6/uL   RDW 13.1 11.2 - 14.5 %   lymph# 1.5 0.9 - 3.3 10e3/uL   MONO# 0.5 0.1 - 0.9 10e3/uL   Eosinophils Absolute 0.1 0.0 - 0.5 10e3/uL   Basophils Absolute 0.0 0.0 - 0.1 10e3/uL   NEUT% 47.9 38.4 - 76.8 %   LYMPH% 38.7 14.0 - 49.7 %   MONO% 11.5 0.0 - 14.0 %   EOS% 1.6 0.0 - 7.0 %   BASO% 0.3 0.0 - 2.0 %  Comprehensive metabolic panel (Cmet) - [facility]  Result Value Ref Range   Sodium 141 136 - 145 mEq/L   Potassium 3.9 3.5 - 5.1 mEq/L   Chloride 107 98 - 109 mEq/L   CO2 27 22 - 29 mEq/L   Glucose 95 70 - 140 mg/dl   BUN 8.5 7.0 - 26.0 mg/dL   Creatinine 0.7 0.6 - 1.1 mg/dL   Total Bilirubin 0.95 0.20 - 1.20 mg/dL   Alkaline Phosphatase 49 40 - 150 U/L   AST 21 5 - 34 U/L   ALT 38 0 - 55 U/L   Total Protein 6.7 6.4 - 8.3 g/dL   Albumin 3.9 3.5 - 5.0 g/dL   Calcium 9.2 8.4 - 10.4 mg/dL   Anion Gap 7 3 - 11 mEq/L   EGFR >90 >90 ml/min/1.73 m2   CBC reviewed at visit,  may not be quite at nadir tho platelets higher than 8-1 suggesting past nadir. Will recheck cbc 8-15 or sooner if any concerns.  UA C&S pending  Studies/Results:  No results found.  Medications: I have reviewed the patient's current medications. NOTE she was given #45 of 4 mg dilaudid, which I expect should be enough for probably rest of all chemo cycles.  Refill decadron 20 mg with food 12 hrs and 6 hrs prior to chemo.  DISCUSSION: all of interval history above reviewed in detail both in EMR and with patient now. Note chemo education class, MD and RN had all discussed possible taxol aches prior to first treatment, tho certainly these can be unexpectedly severe in some patients; fortunately the aches do resolve entirely just given time. Tho usually pain medication is not particularly helpful for taxol aches, she did get some relief with dilaudid, which will be ok to use sparingly after further treatments if needed. See comment above re # of dilaudid tablets prescribed. We discussed continuing with Claritin after  chemo treatments, trying heating pad, hot shower or ice packs also prn for aches. Usually taxol aches begin ~ same time interval after chemo, frequently in same locations tho in some patients the pain location is variable. I would not decrease the taxol dose now, as chemo is in curative attempt. We discussed taxol reaction, and she understands that she will continue full steroid and other premeds and very close observation by infusion RN during treatments, however this reaction itself does not contraindicate additional taxol.  We discussed taxol peripheral neuropathy, as well as balancing need for the taxol from standpoint of cancer treatment. Mild, intermittent neuropathy is not indication to stop or decrease taxol. I have encouraged her to exercise hands and feet as much as possible. She understands that hair loss generally happens around second treatment.  She would like to resume bike riding. We discussed avoiding hottest times of day, good hydration, short rides, however she is also due back to Dr Thurston Pounds, so probably best to ok with her first.    Addendum: cancellation on MD schedule on 8-18, so will see patient that day + lab, prior to cycle 2 on 12-26-14.   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 Plan 6 cycles of q 3 week taxol carboplatin, cycle 1 given 12-18-73, complicated by taxol infusion reaction and aches. Plan as above. Note added to chemo orders with alert for taxol reaction cycle 1. NOTE her insurance refused preauthorization for granix up front, see phone note 11-28-14 with contact # for drug to be authorized during treatment if needed.  Genetics counseling later this week.  2.wound cellulitis resolved with keflex 3.poor peripheral venous access: PAC placed by IR 4. prophylactic lovenox x 4 weeks post op completed. No LE swelling now. 5.endometriosis 6.surgical menopause: hot flashes controlled with celexa and  gabapentin now 7.hypothyroidism: recently diagnosed, due follow up with PCP since beginning synthroid 8. Degenerative arthritis right knee 9.morbid obesity, BMI 44. She had lost 16 lbs in one month after starting bike riding 10.hx migraines, no problem with zofran cycle 1 11.anaphylaxis to avelox, rash to sulfa 12.chronic GERD: now on protonix, as this may aggravate chemo nausea otherwise 13.environmental allergies controlled with prn claritin D 14.slight dysuria: check urine  All questions answered. Cc note to Dr Thurston Pounds. RN will confirm with patient that she has 6 week post op appointment set up at [facility]. Time spent 40 min  including >50% counseling and coordination of care.     Burnis Halling P, MD   12/15/2014, 5:52 PM

## 2014-12-15 NOTE — Telephone Encounter (Signed)
Appointments and avs pritned for patient

## 2014-12-15 NOTE — Patient Instructions (Signed)

## 2014-12-16 LAB — URINE CULTURE

## 2014-12-17 ENCOUNTER — Ambulatory Visit (HOSPITAL_BASED_OUTPATIENT_CLINIC_OR_DEPARTMENT_OTHER): Payer: BLUE CROSS/BLUE SHIELD

## 2014-12-17 ENCOUNTER — Ambulatory Visit (HOSPITAL_BASED_OUTPATIENT_CLINIC_OR_DEPARTMENT_OTHER): Payer: BLUE CROSS/BLUE SHIELD | Admitting: Genetic Counselor

## 2014-12-17 ENCOUNTER — Other Ambulatory Visit: Payer: BLUE CROSS/BLUE SHIELD

## 2014-12-17 ENCOUNTER — Other Ambulatory Visit: Payer: Self-pay | Admitting: Oncology

## 2014-12-17 ENCOUNTER — Telehealth: Payer: Self-pay | Admitting: Oncology

## 2014-12-17 ENCOUNTER — Telehealth: Payer: Self-pay

## 2014-12-17 ENCOUNTER — Encounter: Payer: Self-pay | Admitting: Genetic Counselor

## 2014-12-17 DIAGNOSIS — Z95828 Presence of other vascular implants and grafts: Secondary | ICD-10-CM | POA: Insufficient documentation

## 2014-12-17 DIAGNOSIS — C561 Malignant neoplasm of right ovary: Secondary | ICD-10-CM | POA: Diagnosis not present

## 2014-12-17 DIAGNOSIS — E8940 Asymptomatic postprocedural ovarian failure: Secondary | ICD-10-CM | POA: Insufficient documentation

## 2014-12-17 DIAGNOSIS — T451X5A Adverse effect of antineoplastic and immunosuppressive drugs, initial encounter: Secondary | ICD-10-CM

## 2014-12-17 DIAGNOSIS — C562 Malignant neoplasm of left ovary: Secondary | ICD-10-CM

## 2014-12-17 DIAGNOSIS — C563 Malignant neoplasm of bilateral ovaries: Secondary | ICD-10-CM

## 2014-12-17 DIAGNOSIS — D701 Agranulocytosis secondary to cancer chemotherapy: Secondary | ICD-10-CM | POA: Insufficient documentation

## 2014-12-17 DIAGNOSIS — Z5111 Encounter for antineoplastic chemotherapy: Secondary | ICD-10-CM | POA: Insufficient documentation

## 2014-12-17 MED ORDER — SODIUM CHLORIDE 0.9 % IJ SOLN
10.0000 mL | INTRAMUSCULAR | Status: DC | PRN
  Administered 2014-12-17: 10 mL via INTRAVENOUS
  Filled 2014-12-17: qty 10

## 2014-12-17 MED ORDER — HEPARIN SOD (PORK) LOCK FLUSH 100 UNIT/ML IV SOLN
500.0000 [IU] | Freq: Once | INTRAVENOUS | Status: AC
  Administered 2014-12-17: 500 [IU] via INTRAVENOUS
  Filled 2014-12-17: qty 5

## 2014-12-17 NOTE — Telephone Encounter (Signed)
-----   Message from Gordy Levan, MD sent at 12/17/2014 10:41 AM EDT ----- Lab note on her also, no UTI.  We talked about starting back riding bike at my visit, but probably best for her to clear this with Dr Thurston Pounds (gyn onc [facility]) first before she tries riding now. I believe she is supposed to see Dr Thurston Pounds for 6 week post op follow up about now - if she does not have that apt, patient should call [facility] to set it up, or Melissa Cross likely can assist.  I also have added appointment with me + lab on 8-18 prior to cycle 2 chemo 8-19. Had a cancellation and I think this will be helpful given problems after cycle 1.  Please let her know above    thanks

## 2014-12-17 NOTE — Telephone Encounter (Signed)
Left message to confirm appointment for 08/18. Lab moved from 08/15 to 08/18 with MD visit

## 2014-12-17 NOTE — Telephone Encounter (Signed)
Discussed the information below with [name].  She verbalized understanding. [name] cross stated that appointment at [facility] on 11-25-14 was her post op visit.  [name] followed her in the gyn clinic to follow  firmness in the  inferior aspect of her incision. Dr. Thurston Pounds to see her after the completion of the 6 cycles of chemotherapy. Office notes from [facility] Dr. Thurston Pounds from 11-25-14 to  Dr. Marko Plume to review.

## 2014-12-17 NOTE — Progress Notes (Signed)
REFERRING PROVIDER: Biagio Borg, MD [place], [place] [address]   Evlyn Clines, MD  PRIMARY PROVIDER:  [name], MD  PRIMARY REASON FOR VISIT:  1. Ovarian cancer, bilateral      HISTORY OF PRESENT ILLNESS:   [name], a 44 y.o. female, was seen for a [facility] cancer genetics consultation at the request of Dr. Marko Plume due to a personal history of clear cell ovarian cancer.  [name] presents to clinic today to discuss the possibility of a hereditary predisposition to cancer, genetic testing, and to further clarify her future cancer risks, as well as potential cancer risks for family members.   In July 2016, at the age of 53, [name] was diagnosed with bilateral clear cell of the ovaries. This was treated with complete hysterectomy, and she has undergone one round of chemotherapy.    CANCER HISTORY:   No history exists.     HORMONAL RISK FACTORS:  Menarche was at age 32.  First live birth at age N/A.  OCP use for approximately 1 years.  Ovaries intact: no.  Hysterectomy: yes.  Menopausal status: surgical menopause.  HRT use: 0 years. Colonoscopy: no; not examined. Mammogram within the last year: no. Number of breast biopsies: 0. Up to date with pelvic exams:  yes. Any excessive radiation exposure in the past:  no  Past Medical History  Diagnosis Date  . Anxiety   . Endometriosis   . Ovarian cancer, bilateral 11/28/2014  . Thyroid disease     Past Surgical History  Procedure Laterality Date  . Abdominal hysterectomy  11/10/14    at [facility], Exp lap, supracervical hyst, BSO, infracolic omentectomy, lymphadenectomy, aortic lymph node sampling    Social History   Social History  . Marital Status: Divorced    Spouse Name: N/A  . Number of Children: 0  . Years of Education: N/A   Social History Main Topics  . Smoking status: Never Smoker   . Smokeless tobacco: None  . Alcohol Use: Yes     Comment: occassionally  . Drug Use: No   . Sexual Activity: Not Asked   Other Topics Concern  . None   Social History Narrative     FAMILY HISTORY:  We obtained a detailed, 4-generation family history.  Significant diagnoses are listed below: Family History  Problem Relation Age of Onset  . Hypertension Mother   . Diabetes Mother   . Atrial fibrillation Father   . Diabetes Maternal Aunt   . Heart failure Maternal Grandmother   . Heart attack Maternal Grandfather   . Dementia Paternal Grandmother   . Dementia Paternal Grandfather    The patient does not have any children.  She has one brother who is cancer free.  Her mother died of complications of diabetes at 53.  She had one sister who also died of Diabetes complications at 60.  Ms. Dorise Bullion maternal grandparents died of heart problems.  Her father is alive at 91.  He has atrial fibrillation, but is otherwise healthy.  He is an only child.  His parents died in their 11s-90s of dementia.  She reports that her paternal grandfather was one of 17 children, and that she thinks some of his siblings may have had cancer.  Patient's ancestors are of [place] and [place] descent. There is no reported Ashkenazi Jewish ancestry. There is no known consanguinity.  GENETIC COUNSELING ASSESSMENT: [name] is a 44 y.o. female with a personal history of ovarian  cancer which somewhat suggestive of a hereditary cancer syndrome and predisposition to cancer. We, therefore, discussed and recommended the following at today's visit.   DISCUSSION: We discussed that the most common cause of hereditary ovarian cancer is the result of a BRCA mutation.  However, there are multiple other genes that can also be implicated.  Many times we see multiple people in the family with cancer, but in her family we do not.  This could be the result of a limited family history, in that her father was an only child.  Alternatively, maybe her cancer is not he result of a hereditary cancer syndrome.  We reviewed  the characteristics, features and inheritance patterns of hereditary cancer syndromes. We also discussed genetic testing, including the appropriate family members to test, the process of testing, insurance coverage and turn-around-time for results. We discussed the implications of a negative, positive and/or variant of uncertain significant result. We recommended [name] pursue genetic testing for the OvaNext gene panel. The OvaNext gene panel offered by [facility] and includes sequencing and rearrangement analysis for the following 24 genes: ATM, BARD1, BRCA1, BRCA2, BRIP1, CDH1, CHEK2, EPCAM, MLH1, MRE11A, MSH2, MSH6, MUTYH, NBN, NF1, PALB2, PMS2, PTEN, RAD50, RAD51C, RAD51D, SMARCA4, STK11, and TP53.   Based on [name]'s personal history of cancer, she meets medical criteria for genetic testing. Despite that she meets criteria, she may still have an out of pocket cost. We discussed that if her out of pocket cost for testing is over $100, the laboratory will call and confirm whether she wants to proceed with testing.  If the out of pocket cost of testing is less than $100 she will be billed by the genetic testing laboratory.   PLAN: After considering the risks, benefits, and limitations, [name]  provided informed consent to pursue genetic testing and the blood sample was sent to [facility] for analysis of the [place]. Results should be available within approximately 2-3 weeks' time, at which point they will be disclosed by telephone to [name], as will any additional recommendations warranted by these results. [name] will receive a summary of her genetic counseling visit and a copy of her results once available. This information will also be available in Epic. We encouraged [name] to remain in contact with cancer genetics annually so that we can continuously update the family history and inform her of any changes in cancer genetics and testing that may be  of benefit for her family. Ms. Dorise Bullion questions were answered to her satisfaction today. Our contact information was provided should additional questions or concerns arise.  Lastly, we encouraged [name] to remain in contact with cancer genetics annually so that we can continuously update the family history and inform her of any changes in cancer genetics and testing that may be of benefit for this family.   Ms.  Dorise Bullion questions were answered to her satisfaction today. Our contact information was provided should additional questions or concerns arise. Thank you for the referral and allowing [place] to share in the care of your patient.   Karen P. Florene Glen, [place], [facility] Certified Genetic Counselor [email or web address] phone: [phone]  The patient was seen for a total of 45 minutes in face-to-face genetic counseling.  This patient was discussed with Drs. Magrinat, Lindi Adie and/or Burr Medico who agrees with the above.    _______________________________________________________________________ For Office Staff:  Number of people involved in session: 1 Was an Intern/ student involved with case: no

## 2014-12-17 NOTE — Patient Instructions (Signed)

## 2014-12-17 NOTE — Telephone Encounter (Signed)
error 

## 2014-12-17 NOTE — Telephone Encounter (Signed)
Left a message stating the results of urine culture as noted below by Dr. Marko Plume.

## 2014-12-17 NOTE — Telephone Encounter (Signed)
-----   Message from Gordy Levan, MD sent at 12/17/2014  9:40 AM EDT ----- Labs seen and need follow up please let her know no UTI

## 2014-12-18 ENCOUNTER — Telehealth: Payer: Self-pay

## 2014-12-18 NOTE — Telephone Encounter (Signed)
Benefits group of the pt will be reaching out to Dr Marko Plume and also md in [place] about extending short term disability.  The md in [place] did the initial paperwork.

## 2014-12-19 ENCOUNTER — Encounter: Payer: Self-pay | Admitting: Oncology

## 2014-12-19 NOTE — Progress Notes (Signed)
I left message for call bk from patient. I need good ph# for [employer] [phone] does not go thru. I need to call them to send in new forms for her.

## 2014-12-22 ENCOUNTER — Telehealth: Payer: Self-pay | Admitting: *Deleted

## 2014-12-22 ENCOUNTER — Other Ambulatory Visit: Payer: BLUE CROSS/BLUE SHIELD

## 2014-12-22 NOTE — Telephone Encounter (Signed)
Re triage note tick bite Fine for her to speak with PCP if needed. The more "at risk" tick bites are from the very tiny ticks (not usual dog sort of ticks) and when the tick has been attached for a long time.

## 2014-12-22 NOTE — Telephone Encounter (Signed)
PT. WAS GIVEN DR.LIVESAY'S COMMENTS. SHE WILL CONTACT HER PRIMARY CARE PHYSICIAN.

## 2014-12-22 NOTE — Telephone Encounter (Signed)
THERE ARE TWO FLAT SCABBED PIN HEAD SIZE AREAS. NO REDNESS NOTED BY PT. NO FEVER.

## 2014-12-24 ENCOUNTER — Other Ambulatory Visit: Payer: Self-pay | Admitting: Oncology

## 2014-12-25 ENCOUNTER — Encounter: Payer: Self-pay | Admitting: Oncology

## 2014-12-25 ENCOUNTER — Other Ambulatory Visit (HOSPITAL_BASED_OUTPATIENT_CLINIC_OR_DEPARTMENT_OTHER): Payer: BLUE CROSS/BLUE SHIELD

## 2014-12-25 ENCOUNTER — Ambulatory Visit (HOSPITAL_BASED_OUTPATIENT_CLINIC_OR_DEPARTMENT_OTHER): Payer: BLUE CROSS/BLUE SHIELD

## 2014-12-25 ENCOUNTER — Ambulatory Visit (HOSPITAL_BASED_OUTPATIENT_CLINIC_OR_DEPARTMENT_OTHER): Payer: BLUE CROSS/BLUE SHIELD | Admitting: Oncology

## 2014-12-25 VITALS — BP 119/80 | HR 81 | Temp 98.7°F | Resp 18 | Ht 68.0 in | Wt 286.8 lb

## 2014-12-25 DIAGNOSIS — K219 Gastro-esophageal reflux disease without esophagitis: Secondary | ICD-10-CM

## 2014-12-25 DIAGNOSIS — C561 Malignant neoplasm of right ovary: Secondary | ICD-10-CM

## 2014-12-25 DIAGNOSIS — N809 Endometriosis, unspecified: Secondary | ICD-10-CM | POA: Diagnosis not present

## 2014-12-25 DIAGNOSIS — E039 Hypothyroidism, unspecified: Secondary | ICD-10-CM

## 2014-12-25 DIAGNOSIS — Z95828 Presence of other vascular implants and grafts: Secondary | ICD-10-CM

## 2014-12-25 DIAGNOSIS — Z5111 Encounter for antineoplastic chemotherapy: Secondary | ICD-10-CM

## 2014-12-25 DIAGNOSIS — C563 Malignant neoplasm of bilateral ovaries: Secondary | ICD-10-CM

## 2014-12-25 DIAGNOSIS — C562 Malignant neoplasm of left ovary: Secondary | ICD-10-CM

## 2014-12-25 DIAGNOSIS — Z6841 Body Mass Index (BMI) 40.0 and over, adult: Secondary | ICD-10-CM

## 2014-12-25 DIAGNOSIS — R300 Dysuria: Secondary | ICD-10-CM

## 2014-12-25 LAB — CBC WITH DIFFERENTIAL/PLATELET
BASO%: 0.5 % (ref 0.0–2.0)
BASOS ABS: 0.1 10*3/uL (ref 0.0–0.1)
EOS ABS: 0.1 10*3/uL (ref 0.0–0.5)
EOS%: 0.7 % (ref 0.0–7.0)
HCT: 37.3 % (ref 34.8–46.6)
HGB: 12.5 g/dL (ref 11.6–15.9)
LYMPH%: 17.2 % (ref 14.0–49.7)
MCH: 29.5 pg (ref 25.1–34.0)
MCHC: 33.6 g/dL (ref 31.5–36.0)
MCV: 87.8 fL (ref 79.5–101.0)
MONO#: 0.9 10*3/uL (ref 0.1–0.9)
MONO%: 6.6 % (ref 0.0–14.0)
NEUT#: 10 10*3/uL — ABNORMAL HIGH (ref 1.5–6.5)
NEUT%: 75 % (ref 38.4–76.8)
Platelets: 311 10*3/uL (ref 145–400)
RBC: 4.25 10*6/uL (ref 3.70–5.45)
RDW: 12.8 % (ref 11.2–14.5)
WBC: 13.4 10*3/uL — ABNORMAL HIGH (ref 3.9–10.3)
lymph#: 2.3 10*3/uL (ref 0.9–3.3)

## 2014-12-25 LAB — COMPREHENSIVE METABOLIC PANEL (CC13)
ALBUMIN: 3.8 g/dL (ref 3.5–5.0)
ALT: 33 U/L (ref 0–55)
AST: 21 U/L (ref 5–34)
Alkaline Phosphatase: 52 U/L (ref 40–150)
Anion Gap: 12 mEq/L — ABNORMAL HIGH (ref 3–11)
BUN: 6.4 mg/dL — AB (ref 7.0–26.0)
CALCIUM: 9.1 mg/dL (ref 8.4–10.4)
CO2: 21 mEq/L — ABNORMAL LOW (ref 22–29)
Chloride: 107 mEq/L (ref 98–109)
Creatinine: 0.8 mg/dL (ref 0.6–1.1)
EGFR: >90 mL/min/{1.73_m2} (ref >90)
Glucose: 121 mg/dl (ref 70–140)
POTASSIUM: 3.5 meq/L (ref 3.5–5.1)
Sodium: 140 mEq/L (ref 136–145)
Total Bilirubin: 0.58 mg/dL (ref 0.20–1.20)
Total Protein: 6.8 g/dL (ref 6.4–8.3)

## 2014-12-25 LAB — TECHNOLOGIST REVIEW

## 2014-12-25 MED ORDER — HEPARIN SOD (PORK) LOCK FLUSH 100 UNIT/ML IV SOLN
500.0000 [IU] | Freq: Once | INTRAVENOUS | Status: AC
  Administered 2014-12-25: 500 [IU] via INTRAVENOUS
  Filled 2014-12-25: qty 5

## 2014-12-25 MED ORDER — SODIUM CHLORIDE 0.9 % IJ SOLN
10.0000 mL | INTRAMUSCULAR | Status: DC | PRN
  Administered 2014-12-25: 10 mL via INTRAVENOUS
  Filled 2014-12-25: qty 10

## 2014-12-25 NOTE — Progress Notes (Signed)
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 Cycle 1 was complicated by taxol aches, tho she did not need gCSF support.   Patient is feeling generally well today, tho she was still more fatigued than baseline in past week; we have discussed fact that often patients feel better but not entirely normal even towards end of each chemo cycle. She has not tried resuming biking (also needs to clear this with Dr Thurston Pounds first). She is anxious about possible reaction/ aches with upcoming treatment. She had slight nausea once this week, no significant peripheral neuropathy, minimal discomfort now at surgical incision, bowels moving regularly.    PAC placed by IR 12-04-14 Genetics testing sent 12-17-14 and pending Pre op CA [id number]  JASNKNLZJ HISTORY Patient had history of endometriosis and infertility, with no abnormal PAP smears but no recent gyn exams. She was in usual health until a few weeks of intermittent low grade nausea, then <24 hours of progressively more severe right upper quadrant abdominal pain. She presented to [facility] ED with the abdominal pain on 11-09-14, with abdominal US not remarkable, then CT AP demonstrated bilateral solid and cystic ovarian masses in total measuring 14 x 10 x 11 cm. She was transferred to [facility] Dr Thurston Pounds, with preoperative CA 125 of 554. On 11-10-14 she had exploratory laparotomy with supracervical hysterectomy, BSO, infracolic omentectomy, bilateral pelvic lymphadenectomy and aortic node sampling. Surgical findings were significant for chocolate colored ascites consistent with ruptured endometrioma, frozen pelvis consistent with stage IV endometriosis and bilateral retroperitoneal fibrosis requiring  bilateral ureterolysis, sigmoid colon densely adherent to uterus and cerix, precluding cervical excision, normal abdominal survey. . Surgical pathology from [facility] [id number]) from 11-10-14 had grade 3 clear cell carcinoma in 6 cm right ovary and 11 cm left ovary with capsules intact, bilateral fallopian tubes not involved, uterus and omentum not involved, no LVSI, for stage IB. No malignant cells in ascites or peritoneal washings. Her postoperative course was uncomplicated. [facility] multidisciplinary conference recommended 6 cycles of adjuvant taxol carboplatin. She will have 6 week post op visit with Dr Thurston Pounds. First carbo taxol was given 2-40-97, complicated by taxol reaction despite full premedication, and severe taxol aches.    Review of systems as above, also: No fever or symptoms of infection. No problems with PAC. No bleeding. No painful swelling LE. Not SOB, no cough.  Remainder of 10 point Review of Systems negative.  Objective:  Vital signs in last 24 hours:  BP 119/80 mmHg  Pulse 81  Temp(Src) 98.7 F (37.1 C) (Oral)  Resp 18  Ht '5\' 8"'  (1.727 m)  Wt 286 lb 12.8 oz (130.092 kg)  BMI 43.62 kg/m2  SpO2 100%  LMP 11/02/2014 Weight up 2 lbs Alert, oriented and appropriate. Ambulatory without assistance.  No alopecia  HEENT:PERRL, sclerae not icteric. Oral mucosa moist without lesions, posterior pharynx clear.  Neck supple. No JVD.  Lymphatics:no cervical,supraclavicular or inguinal adenopathy Resp: clear to auscultation bilaterally and normal percussion bilaterally Cardio: regular rate and rhythm. No gallop. GI: abdomen obese, soft, nontender, not distended, no appreciable mass or organomegaly. Normally active bowel sounds. Surgical incision closed, not tender, no erythema. Smoothly rounded 2-3 cm area deep to inferior aspect of incision still obvious but  less prominent than just after surgery and not tender. Musculoskeletal/ Extremities: without pitting edema, cords,  tenderness Neuro: no peripheral neuropathy. Otherwise nonfocal Skin without rash, ecchymosis, petechiae. No obvious problems at tick site on toe Portacath-without erythema or tenderness  Lab Results:  Results for orders placed or performed in visit on 12/25/14  TECHNOLOGIST REVIEW  Result Value Ref Range   Technologist Review Metas and Myelocytes present   CBC with Differential  Result Value Ref Range   WBC 13.4 (H) 3.9 - 10.3 10e3/uL   NEUT# 10.0 (H) 1.5 - 6.5 10e3/uL   HGB 12.5 11.6 - 15.9 g/dL   HCT 37.3 34.8 - 46.6 %   Platelets 311 145 - 400 10e3/uL   MCV 87.8 79.5 - 101.0 fL   MCH 29.5 25.1 - 34.0 pg   MCHC 33.6 31.5 - 36.0 g/dL   RBC 4.25 3.70 - 5.45 10e6/uL   RDW 12.8 11.2 - 14.5 %   lymph# 2.3 0.9 - 3.3 10e3/uL   MONO# 0.9 0.1 - 0.9 10e3/uL   Eosinophils Absolute 0.1 0.0 - 0.5 10e3/uL   Basophils Absolute 0.1 0.0 - 0.1 10e3/uL   NEUT% 75.0 38.4 - 76.8 %   LYMPH% 17.2 14.0 - 49.7 %   MONO% 6.6 0.0 - 14.0 %   EOS% 0.7 0.0 - 7.0 %   BASO% 0.5 0.0 - 2.0 %  Comprehensive metabolic panel (Cmet) - [facility]  Result Value Ref Range   Sodium 140 136 - 145 mEq/L   Potassium 3.5 3.5 - 5.1 mEq/L   Chloride 107 98 - 109 mEq/L   CO2 21 (L) 22 - 29 mEq/L   Glucose 121 70 - 140 mg/dl   BUN 6.4 (L) 7.0 - 26.0 mg/dL   Creatinine 0.8 0.6 - 1.1 mg/dL   Total Bilirubin 0.58 0.20 - 1.20 mg/dL   Alkaline Phosphatase 52 40 - 150 U/L   AST 21 5 - 34 U/L   ALT 33 0 - 55 U/L   Total Protein 6.8 6.4 - 8.3 g/dL   Albumin 3.8 3.5 - 5.0 g/dL   Calcium 9.1 8.4 - 10.4 mg/dL   Anion Gap 12 (H) 3 - 11 mEq/L   EGFR >90 >90 ml/min/1.73 m2   CA 125 available after visit 44, this having been 554 preoperatively  Studies/Results:  No results found.  Medications: I have reviewed the patient's current medications. Reviewed timing of premedication decadron, which will be kept at full dose due to possible infusion reaction with cycle 1. Discussed using prn ativan tonight and again before leaving home  for treatment tomorrow if significant anxiety. She will start claritin tomorrow with chemo.  DISCUSSION: Meds as above.  Reminded patient that hot shower was also helpful for taxol aches. She has dilaudid available which she can use as soon as she notices taxol aches, then for next couple of days prn, but DC as soon as aches not severe; note she was dispensed 45 tablets with cycle 1 (by another provider) so should not need any more of these for remainder of chemo.   Patient requests letter to assist in getting lift chair, which she wants for use during days of the taxol aches. She does not have a regular recliner. I have told her that, in my experience,  lift chairs are generally difficult to have covered by insurance even for more typical diagnoses and would not be medically necessary for this diagnosis. She has personal contact for the lift chair company, understands that MD letter  may decrease taxes by ~ $50; I have written letter that does not include "medically necessary".   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 Plan 6 cycles of q 3 week taxol carboplatin, cycle 1 given 05-11-13, complicated by taxol infusion reaction and aches. Note added to chemo orders with alert for taxol reaction cycle 1, prn ativan added, plan for aches as above. I will see her back with lab 01-01-15.  Genetics testing sent and pending. NOTE her insurance refused preauthorization for granix up front, see phone note 11-28-14 with contact # for drug to be authorized during treatment if needed.   2.wound cellulitis resolved with keflex 3.poor peripheral venous access: PAC placed by IR 4. prophylactic lovenox x 4 weeks post op completed. No LE swelling now. 5.endometriosis 6.surgical menopause: hot flashes controlled with celexa and gabapentin now 7.hypothyroidism: recently diagnosed, due follow up with PCP since beginning synthroid 8. Degenerative arthritis  right knee 9.morbid obesity, BMI 44. She had lost 16 lbs in one month after starting bike riding 10.hx migraines, no problem with zofran cycle 1 11.anaphylaxis to avelox, rash to sulfa 12.chronic GERD: now on protonix, as this may aggravate chemo nausea otherwise 13.environmental allergies controlled with prn claritin D 14.slight dysuria: urine culture negative 8--8-16   All questions answered. Chemo orders confirmed. Time spent 25 min including >50% counseling and coordination of care. OK to treat 8-19 by labs done today.  Cc Dr Lucianne Lei Aloha Gell, MD   12/25/2014, 5:40 PM

## 2014-12-26 ENCOUNTER — Ambulatory Visit (HOSPITAL_BASED_OUTPATIENT_CLINIC_OR_DEPARTMENT_OTHER): Payer: BLUE CROSS/BLUE SHIELD | Admitting: Nurse Practitioner

## 2014-12-26 ENCOUNTER — Other Ambulatory Visit: Payer: BLUE CROSS/BLUE SHIELD

## 2014-12-26 ENCOUNTER — Ambulatory Visit (HOSPITAL_BASED_OUTPATIENT_CLINIC_OR_DEPARTMENT_OTHER): Payer: BLUE CROSS/BLUE SHIELD

## 2014-12-26 ENCOUNTER — Encounter: Payer: Self-pay | Admitting: Nurse Practitioner

## 2014-12-26 VITALS — BP 145/70 | HR 102 | Temp 98.0°F | Resp 20

## 2014-12-26 DIAGNOSIS — C561 Malignant neoplasm of right ovary: Secondary | ICD-10-CM

## 2014-12-26 DIAGNOSIS — C563 Malignant neoplasm of bilateral ovaries: Secondary | ICD-10-CM

## 2014-12-26 DIAGNOSIS — Z5111 Encounter for antineoplastic chemotherapy: Secondary | ICD-10-CM

## 2014-12-26 DIAGNOSIS — M791 Myalgia, unspecified site: Secondary | ICD-10-CM

## 2014-12-26 DIAGNOSIS — C562 Malignant neoplasm of left ovary: Secondary | ICD-10-CM | POA: Diagnosis not present

## 2014-12-26 LAB — CA 125: CA 125: 44 U/mL — AB (ref <35)

## 2014-12-26 MED ORDER — SODIUM CHLORIDE 0.9 % IV SOLN
Freq: Once | INTRAVENOUS | Status: AC
  Administered 2014-12-26: 11:00:00 via INTRAVENOUS
  Filled 2014-12-26: qty 8

## 2014-12-26 MED ORDER — CARBOPLATIN CHEMO INJECTION 600 MG/60ML
750.0000 mg | Freq: Once | INTRAVENOUS | Status: AC
  Administered 2014-12-26: 750 mg via INTRAVENOUS
  Filled 2014-12-26: qty 75

## 2014-12-26 MED ORDER — DIPHENHYDRAMINE HCL 50 MG/ML IJ SOLN
50.0000 mg | Freq: Once | INTRAMUSCULAR | Status: AC
  Administered 2014-12-26: 50 mg via INTRAVENOUS

## 2014-12-26 MED ORDER — FAMOTIDINE IN NACL 20-0.9 MG/50ML-% IV SOLN
INTRAVENOUS | Status: AC
  Filled 2014-12-26: qty 50

## 2014-12-26 MED ORDER — DIPHENHYDRAMINE HCL 50 MG/ML IJ SOLN
INTRAMUSCULAR | Status: AC
  Filled 2014-12-26: qty 1

## 2014-12-26 MED ORDER — PACLITAXEL CHEMO INJECTION 300 MG/50ML
175.0000 mg/m2 | Freq: Once | INTRAVENOUS | Status: AC
  Administered 2014-12-26: 438 mg via INTRAVENOUS
  Filled 2014-12-26: qty 73

## 2014-12-26 MED ORDER — HEPARIN SOD (PORK) LOCK FLUSH 100 UNIT/ML IV SOLN
500.0000 [IU] | Freq: Once | INTRAVENOUS | Status: AC | PRN
  Administered 2014-12-26: 500 [IU]
  Filled 2014-12-26: qty 5

## 2014-12-26 MED ORDER — FAMOTIDINE IN NACL 20-0.9 MG/50ML-% IV SOLN
20.0000 mg | Freq: Once | INTRAVENOUS | Status: AC
  Administered 2014-12-26: 20 mg via INTRAVENOUS

## 2014-12-26 MED ORDER — LORAZEPAM 2 MG/ML IJ SOLN
INTRAMUSCULAR | Status: AC
  Filled 2014-12-26: qty 1

## 2014-12-26 MED ORDER — LORAZEPAM 2 MG/ML IJ SOLN
0.5000 mg | Freq: Once | INTRAMUSCULAR | Status: AC
Start: 2014-12-26 — End: 2014-12-26
  Administered 2014-12-26: 0.5 mg via INTRAVENOUS

## 2014-12-26 MED ORDER — LORAZEPAM 2 MG/ML IJ SOLN
1.0000 mg | Freq: Once | INTRAMUSCULAR | Status: DC | PRN
  Administered 2014-12-26: 1 mg via INTRAVENOUS

## 2014-12-26 MED ORDER — SODIUM CHLORIDE 0.9 % IJ SOLN
10.0000 mL | INTRAMUSCULAR | Status: DC | PRN
  Administered 2014-12-26: 10 mL
  Filled 2014-12-26: qty 10

## 2014-12-26 MED ORDER — SODIUM CHLORIDE 0.9 % IV SOLN
Freq: Once | INTRAVENOUS | Status: AC
  Administered 2014-12-26: 11:00:00 via INTRAVENOUS

## 2014-12-26 NOTE — Patient Instructions (Signed)
[facility] Discharge Instructions for Patients Receiving Chemotherapy  Today you received the following chemotherapy agents taxol/carboplatin  To help prevent nausea and vomiting after your treatment, we encourage you to take your nausea medication as directed   If you develop nausea and vomiting that is not controlled by your nausea medication, call the clinic.   BELOW ARE SYMPTOMS THAT SHOULD BE REPORTED IMMEDIATELY:  *FEVER GREATER THAN 100.5 F  *CHILLS WITH OR WITHOUT FEVER  NAUSEA AND VOMITING THAT IS NOT CONTROLLED WITH YOUR NAUSEA MEDICATION  *UNUSUAL SHORTNESS OF BREATH  *UNUSUAL BRUISING OR BLEEDING  TENDERNESS IN MOUTH AND THROAT WITH OR WITHOUT PRESENCE OF ULCERS  *URINARY PROBLEMS  *BOWEL PROBLEMS  UNUSUAL RASH Items with * indicate a potential emergency and should be followed up as soon as possible.  Feel free to call the clinic you have any questions or concerns. The clinic phone number is [phone].  

## 2014-12-26 NOTE — Progress Notes (Signed)
SYMPTOM MANAGEMENT CLINIC   HPI: [name] 44 y.o. female diagnosed with bilateral ovarian cancer.  Currently undergoing carboplatin/Taxol chemotherapy regimen.  Patient experienced a hypersensitivity reaction of severe pain to her lower back radiating down both legs during her first cycle of Taxol/carboplatin chemotherapy.  She subsequently developed significant Taxol aches following this first cycle of chemotherapy.  She took Dilaudid on an as-needed basis, which did help control the discomfort.  Patient states that the myalgias resolved a few days after her chemotherapy.  Patient was premedicated today prior to her chemotherapy with Benadryl 50 mg, Pepcid 20 mg, Zofran, and Ativan 1 mg.  She developed some restless legs and trace achiness to her bilateral lower extremities following the administration of the Benadryl.  Patient was given additional Ativan IV; which did seem to resolve all symptoms.  HPI  ROS  Past Medical History  Diagnosis Date  . Anxiety   . Endometriosis   . Ovarian cancer, bilateral 11/28/2014  . Thyroid disease     Past Surgical History  Procedure Laterality Date  . Abdominal hysterectomy  11/10/14    at [facility], Exp lap, supracervical hyst, BSO, infracolic omentectomy, lymphadenectomy, aortic lymph node sampling    has OBESITY, MORBID; Anxiety state; DEPRESSION; MIGRAINE HEADACHE; Unspecified otitis media; EAR PAIN, BILATERAL; Acute sinusitis; ALLERGIC RHINITIS; Acute bronchospasm; GERD; ENDOMETRIOSIS; INGROWN TOENAIL; KNEE PAIN, RIGHT; BACK PAIN; Insomnia; RASH-NONVESICULAR; Edema; Lower back pain; Dyspnea; Right knee pain; Wellness examination; Hypothyroidism; Hypersomnolence; Postoperative cellulitis of surgical wound; Ovarian cancer, bilateral; Dysuria; Poor venous access; Morbid obesity with BMI of 40.0-44.9, adult; Hypersensitivity reaction; Myalgia; Premature surgical menopause; Leukopenia due to antineoplastic chemotherapy; Port catheter in place; and  Encounter for antineoplastic chemotherapy on her problem list.    is allergic to moxifloxacin; quinolones; doxycycline; escitalopram oxalate; and sulfonamide derivatives.    Medication List       This list is accurate as of: 12/26/14  5:48 PM.  Always use your most recent med list.               albuterol 108 (90 BASE) MCG/ACT inhaler  Commonly known as:  PROVENTIL HFA;VENTOLIN HFA  Inhale 2 puffs into the lungs every 6 (six) hours as needed for wheezing or shortness of breath.     ALPRAZolam 1 MG tablet  Commonly known as:  XANAX  1/2 tab by mouth twice per day as needed     aspirin-acetaminophen-caffeine 250-250-65 MG per tablet  Commonly known as:  EXCEDRIN MIGRAINE  Take 2 tablets by mouth 2 (two) times daily as needed for headache or migraine.     citalopram 40 MG tablet  Commonly known as:  CELEXA  Take 1 tablet (40 mg total) by mouth daily.     cyclobenzaprine 5 MG tablet  Commonly known as:  FLEXERIL  Take 1 tablet (5 mg total) by mouth 3 (three) times daily as needed for muscle spasms.     dexamethasone 4 MG tablet  Commonly known as:  DECADRON  Take 5 tablets with food 12 hrs and 6 hrs prior to Taxol chemotherapy     diclofenac sodium 1 % Gel  Commonly known as:  VOLTAREN  Use topically four times per day as needed for pain     gabapentin 300 MG capsule  Commonly known as:  NEURONTIN  Take 300 mg by mouth 3 (three) times daily.     hydrochlorothiazide 25 MG tablet  Commonly known as:  HYDRODIURIL  1/2 tab by mouth per day as needed for  swelling     HYDROcodone-acetaminophen 5-325 MG per tablet  Commonly known as:  NORCO/VICODIN  Take 1-2 tablets by mouth every 4 (four) hours as needed for moderate pain.     HYDROmorphone 4 MG tablet  Commonly known as:  DILAUDID  Take 1/2 to 1 whole tab (2 mg to 4 mg) Q 4-6 hours PRN severe pain.     levothyroxine 25 MCG tablet  Commonly known as:  LEVOTHROID  Take 1 tablet (25 mcg total) by mouth daily before  breakfast.     lidocaine-prilocaine cream  Commonly known as:  EMLA  Apply 1-2 hrs prior to Porta-Cath access     LORazepam 1 MG tablet  Commonly known as:  ATIVAN  Place 1/2 -1 tablet under the tongue or swallow every 6 hours as needed for nausea. Will make drowsy / forgetful around each dose.     naproxen 500 MG tablet  Commonly known as:  NAPROSYN  Take 1 tablet (500 mg total) by mouth 2 (two) times daily with a meal. As needed for pain     ondansetron 8 MG tablet  Commonly known as:  ZOFRAN  Take 1 tablet (8 mg total) by mouth every 8 (eight) hours as needed for nausea or vomiting. Will not make drowsy.     pantoprazole 40 MG tablet  Commonly known as:  PROTONIX  Take 1 tablet (40 mg total) by mouth daily.     SUMAtriptan 100 MG tablet  Commonly known as:  IMITREX  Take 1 tablet (100 mg total) by mouth every 2 (two) hours as needed for migraine or headache. May repeat in 2 hours if headache persists or recurs.     triamcinolone cream 0.1 %  Commonly known as:  KENALOG  Apply 1 application topically 2 (two) times daily.     VITAMIN B-12 PO  Take 2 tablets by mouth daily.         PHYSICAL EXAMINATION  Oncology Vitals 12/26/2014 12/26/2014 12/26/2014 12/26/2014 12/26/2014 12/26/2014 12/26/2014  Height - - - - - - -  Weight - - - - - - -  Weight (lbs) - - - - - - -  BMI (kg/m2) - - - - - - -  Temp - - - - - 98 97.5  Pulse 102 95 102 98 100 104 105  Resp _0 -  SpO2 95 96 98 95 97 98 -  BSA (m2) - - - - - - -   BP Readings from Last 3 Encounters:  12/26/14 145/70  12/25/14 119/80  12/15/14 127/85    Physical Exam  Constitutional: She is oriented to person, place, and time and well-developed, well-nourished, and in no distress.  HENT:  Head: Normocephalic and atraumatic.  Eyes: Conjunctivae and EOM are normal. Pupils are equal, round, and reactive to light. Right eye exhibits no discharge. Left eye exhibits no discharge. No scleral icterus.  Neck:  Normal range of motion.  Pulmonary/Chest: Effort normal. No respiratory distress.  Musculoskeletal: Normal range of motion.  Neurological: She is alert and oriented to person, place, and time. Gait normal.  Psychiatric: Affect normal.  Nursing note and vitals reviewed.   LABORATORY DATA:. Appointment on 12/25/2014  Component Date Value Ref Range Status  . WBC 12/25/2014 13.4* 3.9 - 10.3 10e3/uL Final  . NEUT# 12/25/2014 10.0* 1.5 - 6.5 10e3/uL Final  . HGB 12/25/2014 12.5  11.6 - 15.9 g/dL Final  . HCT 12/25/2014 37.3  34.8 - 46.6 %  Final  . Platelets 12/25/2014 311  145 - 400 10e3/uL Final  . MCV 12/25/2014 87.8  79.5 - 101.0 fL Final  . MCH 12/25/2014 29.5  25.1 - 34.0 pg Final  . MCHC 12/25/2014 33.6  31.5 - 36.0 g/dL Final  . RBC 12/25/2014 4.25  3.70 - 5.45 10e6/uL Final  . RDW 12/25/2014 12.8  11.2 - 14.5 % Final  . lymph# 12/25/2014 2.3  0.9 - 3.3 10e3/uL Final  . MONO# 12/25/2014 0.9  0.1 - 0.9 10e3/uL Final  . Eosinophils Absolute 12/25/2014 0.1  0.0 - 0.5 10e3/uL Final  . Basophils Absolute 12/25/2014 0.1  0.0 - 0.1 10e3/uL Final  . NEUT% 12/25/2014 75.0  38.4 - 76.8 % Final  . LYMPH% 12/25/2014 17.2  14.0 - 49.7 % Final  . MONO% 12/25/2014 6.6  0.0 - 14.0 % Final  . EOS% 12/25/2014 0.7  0.0 - 7.0 % Final  . BASO% 12/25/2014 0.5  0.0 - 2.0 % Final  . Sodium 12/25/2014 140  136 - 145 mEq/L Final  . Potassium 12/25/2014 3.5  3.5 - 5.1 mEq/L Final  . Chloride 12/25/2014 107  98 - 109 mEq/L Final  . CO2 12/25/2014 21* 22 - 29 mEq/L Final  . Glucose 12/25/2014 121  70 - 140 mg/dl Final  . BUN 12/25/2014 6.4* 7.0 - 26.0 mg/dL Final  . Creatinine 12/25/2014 0.8  0.6 - 1.1 mg/dL Final  . Total Bilirubin 12/25/2014 0.58  0.20 - 1.20 mg/dL Final  . Alkaline Phosphatase 12/25/2014 52  40 - 150 U/L Final  . AST 12/25/2014 21  5 - 34 U/L Final  . ALT 12/25/2014 33  0 - 55 U/L Final  . Total Protein 12/25/2014 6.8  6.4 - 8.3 g/dL Final  . Albumin 12/25/2014 3.8  3.5 - 5.0 g/dL  Final  . Calcium 12/25/2014 9.1  8.4 - 10.4 mg/dL Final  . Anion Gap 12/25/2014 12* 3 - 11 mEq/L Final  . EGFR 12/25/2014 >90  >90 ml/min/1.73 m2 Final   eGFR is calculated using the CKD-EPI Creatinine Equation (2009)  . CA 125 12/25/2014 44* <35 U/mL Final   Comment:  This test was performed using the [place] chemiluminescentmethod.  Values obtained from different assay methods cannot be usedinterchangeably.  CA125 levels , regardless of value, should not beinterpreted as absolute evidence of the presence or  absence ofdisease.   . Technologist Review 12/25/2014 Metas and Myelocytes present   Final     RADIOGRAPHIC STUDIES: No results found.  ASSESSMENT/PLAN:    Myalgia Patient experienced a hypersensitivity reaction of severe pain to her lower back radiating down both legs during her first cycle of Taxol/carboplatin chemotherapy.  She subsequently developed significant Taxol aches following this first cycle of chemotherapy.  She took Dilaudid on an as-needed basis, which did help control the discomfort.  Patient states that the myalgias resolved a few days after her chemotherapy.  Patient was premedicated today prior to her chemotherapy with Benadryl 50 mg, Pepcid 20 mg, Zofran, and Ativan 1 mg.  She developed some restless legs and trace achiness to her bilateral lower extremities following the administration of the Benadryl.  Patient was given additional Ativan IV; which did seem to resolve all symptoms.  Advised patient to take the Dilaudid she already has at home if she develops any Taxol aches again.  Ovarian cancer, bilateral Patient received cycle one of her carboplatin/Taxol chemotherapy regimen on 12/05/2014.  She presents to the [facility] today for her next cycle  of the same regimen.  Patient is happy to report that all of her Taxol aches have since resolved.  Blood counts obtained yesterday were essentially stable.  Patient was premedicated with Benadryl, Pepcid,  Zofran, and Ativan prior to initiation of this next cycle of chemotherapy.  She did experience some restless legs after the administration of the Benadryl; but otherwise tolerated her chemotherapy fairly well.  Patient is scheduled to return for labs and a follow-up visit on 01/01/2015.   Patient stated understanding of all instructions; and was in agreement with this plan of care. The patient knows to call the clinic with any problems, questions or concerns.   Review/collaboration with Dr. Marko Plume regarding all aspects of patient's visit today.   Total time spent with patient was 15 minutes;  with greater than 75 percent of that time spent in face to face counseling regarding patient's symptoms,  and coordination of care and follow up.  Disclaimer: This note was dictated with voice recognition software. Similar sounding words can inadvertently be transcribed and may not be corrected upon review.   Drue Second, NP 12/26/2014

## 2014-12-26 NOTE — Assessment & Plan Note (Signed)
Patient experienced a hypersensitivity reaction of severe pain to her lower back radiating down both legs during her first cycle of Taxol/carboplatin chemotherapy.  She subsequently developed significant Taxol aches following this first cycle of chemotherapy.  She took Dilaudid on an as-needed basis, which did help control the discomfort.  Patient states that the myalgias resolved a few days after her chemotherapy.  Patient was premedicated today prior to her chemotherapy with Benadryl 50 mg, Pepcid 20 mg, Zofran, and Ativan 1 mg.  She developed some restless legs and trace achiness to her bilateral lower extremities following the administration of the Benadryl.  Patient was given additional Ativan IV; which did seem to resolve all symptoms.  Advised patient to take the Dilaudid she already has at home if she develops any Taxol aches again.

## 2014-12-26 NOTE — Assessment & Plan Note (Signed)
Patient received cycle one of her carboplatin/Taxol chemotherapy regimen on 12/05/2014.  She presents to the [facility] today for her next cycle of the same regimen.  Patient is happy to report that all of her Taxol aches have since resolved.  Blood counts obtained yesterday were essentially stable.  Patient was premedicated with Benadryl, Pepcid, Zofran, and Ativan prior to initiation of this next cycle of chemotherapy.  She did experience some restless legs after the administration of the Benadryl; but otherwise tolerated her chemotherapy fairly well.  Patient is scheduled to return for labs and a follow-up visit on 01/01/2015.

## 2014-12-29 ENCOUNTER — Telehealth: Payer: Self-pay | Admitting: *Deleted

## 2014-12-29 ENCOUNTER — Other Ambulatory Visit: Payer: Self-pay | Admitting: *Deleted

## 2014-12-29 DIAGNOSIS — C561 Malignant neoplasm of right ovary: Secondary | ICD-10-CM

## 2014-12-29 MED ORDER — PROCHLORPERAZINE MALEATE 10 MG PO TABS
10.0000 mg | ORAL_TABLET | Freq: Four times a day (QID) | ORAL | Status: DC | PRN
Start: 2014-12-29 — End: 2015-05-25

## 2014-12-29 NOTE — Telephone Encounter (Signed)
Called patient who left voicemail that she is "Throwing up this morning.  What can be done about this."  [name].  Woke her up.  Reports vomiting three times today last time being about an hour before I called.  Emesis is yellow in color.  Has been able to consume light sip on Ensure, eat an orange and peanuts.  Last Bowel movement was yesterday and normal with no straining.  [place] [facility] [place].  Uses  [facility] on [address].  Verbal order received and read back from Selena Lesser NP for Compazine.  Order sent.  Patient notified and advised she call in am at 0900 for update for possible labs and IVF.

## 2014-12-29 NOTE — Telephone Encounter (Signed)
Called Compazine 10 mg take 1 q6 hrs prn N/V.  Qty 60 with no refills

## 2014-12-31 ENCOUNTER — Other Ambulatory Visit: Payer: Self-pay | Admitting: Oncology

## 2015-01-01 ENCOUNTER — Ambulatory Visit (HOSPITAL_BASED_OUTPATIENT_CLINIC_OR_DEPARTMENT_OTHER): Payer: BLUE CROSS/BLUE SHIELD | Admitting: Oncology

## 2015-01-01 ENCOUNTER — Telehealth: Payer: Self-pay | Admitting: Oncology

## 2015-01-01 ENCOUNTER — Ambulatory Visit (HOSPITAL_BASED_OUTPATIENT_CLINIC_OR_DEPARTMENT_OTHER): Payer: BLUE CROSS/BLUE SHIELD

## 2015-01-01 ENCOUNTER — Other Ambulatory Visit (HOSPITAL_BASED_OUTPATIENT_CLINIC_OR_DEPARTMENT_OTHER): Payer: BLUE CROSS/BLUE SHIELD

## 2015-01-01 ENCOUNTER — Encounter: Payer: Self-pay | Admitting: Oncology

## 2015-01-01 VITALS — BP 124/80 | HR 100 | Temp 98.3°F | Resp 18 | Ht 68.0 in | Wt 280.1 lb

## 2015-01-01 DIAGNOSIS — Z95828 Presence of other vascular implants and grafts: Secondary | ICD-10-CM

## 2015-01-01 DIAGNOSIS — C561 Malignant neoplasm of right ovary: Secondary | ICD-10-CM

## 2015-01-01 DIAGNOSIS — R1013 Epigastric pain: Secondary | ICD-10-CM

## 2015-01-01 DIAGNOSIS — C563 Malignant neoplasm of bilateral ovaries: Secondary | ICD-10-CM

## 2015-01-01 DIAGNOSIS — M791 Myalgia, unspecified site: Secondary | ICD-10-CM

## 2015-01-01 DIAGNOSIS — R112 Nausea with vomiting, unspecified: Secondary | ICD-10-CM | POA: Diagnosis not present

## 2015-01-01 DIAGNOSIS — G620 Drug-induced polyneuropathy: Secondary | ICD-10-CM

## 2015-01-01 DIAGNOSIS — C562 Malignant neoplasm of left ovary: Secondary | ICD-10-CM | POA: Diagnosis not present

## 2015-01-01 DIAGNOSIS — E039 Hypothyroidism, unspecified: Secondary | ICD-10-CM

## 2015-01-01 DIAGNOSIS — R10816 Epigastric abdominal tenderness: Secondary | ICD-10-CM | POA: Diagnosis not present

## 2015-01-01 DIAGNOSIS — R200 Anesthesia of skin: Secondary | ICD-10-CM

## 2015-01-01 DIAGNOSIS — T451X5A Adverse effect of antineoplastic and immunosuppressive drugs, initial encounter: Secondary | ICD-10-CM

## 2015-01-01 DIAGNOSIS — F419 Anxiety disorder, unspecified: Secondary | ICD-10-CM

## 2015-01-01 DIAGNOSIS — K219 Gastro-esophageal reflux disease without esophagitis: Secondary | ICD-10-CM

## 2015-01-01 DIAGNOSIS — G622 Polyneuropathy due to other toxic agents: Secondary | ICD-10-CM

## 2015-01-01 DIAGNOSIS — Z5111 Encounter for antineoplastic chemotherapy: Secondary | ICD-10-CM

## 2015-01-01 DIAGNOSIS — N951 Menopausal and female climacteric states: Secondary | ICD-10-CM

## 2015-01-01 LAB — CBC WITH DIFFERENTIAL/PLATELET
BASO%: 0.2 % (ref 0.0–2.0)
BASOS ABS: 0 10*3/uL (ref 0.0–0.1)
EOS%: 2.7 % (ref 0.0–7.0)
Eosinophils Absolute: 0.2 10*3/uL (ref 0.0–0.5)
HCT: 36.6 % (ref 34.8–46.6)
HGB: 12.6 g/dL (ref 11.6–15.9)
LYMPH%: 32.3 % (ref 14.0–49.7)
MCH: 29.7 pg (ref 25.1–34.0)
MCHC: 34.4 g/dL (ref 31.5–36.0)
MCV: 86.3 fL (ref 79.5–101.0)
MONO#: 0.1 10*3/uL (ref 0.1–0.9)
MONO%: 1.8 % (ref 0.0–14.0)
NEUT#: 3.8 10*3/uL (ref 1.5–6.5)
NEUT%: 63 % (ref 38.4–76.8)
Platelets: 212 10*3/uL (ref 145–400)
RBC: 4.24 10*6/uL (ref 3.70–5.45)
RDW: 12.6 % (ref 11.2–14.5)
WBC: 6 10*3/uL (ref 3.9–10.3)
lymph#: 1.9 10*3/uL (ref 0.9–3.3)

## 2015-01-01 LAB — COMPREHENSIVE METABOLIC PANEL (CC13)
ALT: 42 U/L (ref 0–55)
AST: 23 U/L (ref 5–34)
Albumin: 4 g/dL (ref 3.5–5.0)
Alkaline Phosphatase: 52 U/L (ref 40–150)
Anion Gap: 8 mEq/L (ref 3–11)
BUN: 11 mg/dL (ref 7.0–26.0)
CHLORIDE: 102 meq/L (ref 98–109)
CO2: 27 mEq/L (ref 22–29)
Calcium: 9.5 mg/dL (ref 8.4–10.4)
Creatinine: 0.8 mg/dL (ref 0.6–1.1)
GLUCOSE: 120 mg/dL (ref 70–140)
POTASSIUM: 3.8 meq/L (ref 3.5–5.1)
SODIUM: 138 meq/L (ref 136–145)
Total Bilirubin: 1.06 mg/dL (ref 0.20–1.20)
Total Protein: 6.9 g/dL (ref 6.4–8.3)

## 2015-01-01 MED ORDER — HEPARIN SOD (PORK) LOCK FLUSH 100 UNIT/ML IV SOLN
500.0000 [IU] | Freq: Once | INTRAVENOUS | Status: AC
  Administered 2015-01-01: 500 [IU] via INTRAVENOUS
  Filled 2015-01-01: qty 5

## 2015-01-01 MED ORDER — SUCRALFATE 1 G PO TABS: 1.0000 g | ORAL_TABLET | Freq: Three times a day (TID) | ORAL | Status: DC

## 2015-01-01 MED ORDER — SODIUM CHLORIDE 0.9 % IJ SOLN
10.0000 mL | INTRAMUSCULAR | Status: DC | PRN
  Administered 2015-01-01: 10 mL via INTRAVENOUS
  Filled 2015-01-01: qty 10

## 2015-01-01 NOTE — Patient Instructions (Signed)

## 2015-01-01 NOTE — Telephone Encounter (Signed)
Gave avs & calendar for September °

## 2015-01-01 NOTE — Progress Notes (Signed)
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 Note she had very difficult time following cycle 1 treatment.   Patient has a number of symptoms and concerns, did speak with nursing on 8-22 re N/V, but has not required ED or symptom management visits since most recent chemotherapy.  Taxol aches mostly in LE began ~ day 3-4 and have eased off enough today (day 7) that she has not needed pain medication since last pm. She is sleeping on soft air mattress instead of firm bed mattress, and heating blanket also helpful. She took dilaudid total 4 mg doses very regularly for aches, tells me she has just finished the 2 mg tablets and has "some" of the 4 mg tablets left (#45 of the 4 mg by another provider on 12-09-14). As aches are improving now, she should not need or use any additional dilaudid now, and I have told her again that this amount of pain medication is much more than usually required. She does say that she is afraid of having pain, which is likely contributing. She tells me that she feels "loopy" from dilaudid, which she does not like. She had more nausea and some vomiting day 4, also does not like feeling "loopy" from ativan. Compazine prescribed 8-22, which she has used alternating with zofran (and possibly ativan) and has been helpful. She had nausea today after eating fast food hamburger and coke. She has kept down gatorade, Ensure, water today.  She initially complained of chest pressure and SOB since last pm, however on careful history and exam this seems to be epigastric tenderness. She is taking daily protonix; see meds below.  Bowels are moving ~ 2x daily, soft formed bowel movement today. She notices slight numbness left thumb, fingertips and moreso on bottoms of feet  since most recent chemo, still close to that treatment so may improve over next 2 weeks. Is wearing sandals without arch support, discussed. She continues gabapentin for hot flashes, which may be helpful for neuropathy also. Scalp was tender prior to starting to lose hair in past week. She has tender scattered folliculitis (see below). No fever. No bleeding. No cough. Not SOB now. No pain on deep inspiration. No increased swelling LE or tender cords.     PAC placed by IR 12-04-14 Genetics testing sent 12-17-14 and pending Pre op CA [id number]   WUXLKGMWN HISTORY Patient had history of endometriosis and infertility, with no abnormal PAP smears but no recent gyn exams. She was in usual health until a few weeks of intermittent low grade nausea, then <24 hours of progressively more severe right upper quadrant abdominal pain. She presented to [facility] ED with the abdominal pain on 11-09-14, with abdominal US not remarkable, then CT AP demonstrated bilateral solid and cystic ovarian masses in total measuring 14 x 10 x 11 cm. She was transferred to [facility] Dr Thurston Pounds, with preoperative CA 125 of 554. On 11-10-14 she had exploratory laparotomy with supracervical hysterectomy, BSO, infracolic omentectomy, bilateral pelvic lymphadenectomy and aortic node sampling. 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 . Surgical pathology  from [facility] [phone]) from 11-10-14 had grade 3 clear cell carcinoma in 6 cm right ovary and 11 cm left ovary with capsules intact, bilateral fallopian tubes not involved, uterus and omentum not involved, no LVSI, for stage IB. No malignant cells in ascites or peritoneal washings. Her postoperative course was  uncomplicated. [facility] multidisciplinary conference recommended 6 cycles of adjuvant taxol carboplatin. She will have 6 week post op visit with Dr Thurston Pounds. First carbo taxol was given 9-92-42, complicated by taxol reaction despite full premedication, and severe taxol aches.    Review of systems as above, also: Bladder ok. No mucositis. No problems with PAC.  Remainder of 10 point Review of Systems negative.  Objective:  Vital signs in last 24 hours:  BP 124/80 mmHg  Pulse 100  Temp(Src) 98.3 F (36.8 C) (Oral)  Resp 18  Ht '5\' 8"'  (1.727 m)  Wt 280 lb 1.6 oz (127.053 kg)  BMI 42.60 kg/m2  SpO2 100%  LMP 11/02/2014 Weight down 6 lbs Alert, oriented and appropriate. Ambulatory without assistance. Looks comfortable but mildly anxious. Respirations not labored RA. Talkative, cooperative, pleasant. Partial alopecia  HEENT:PERRL, sclerae not icteric. Oral mucosa moist without lesions, posterior pharynx clear.  Neck supple. No JVD. Scattered folliculitis on scalp, no open areas, individual sites minimally erythematous. Lymphatics:no supraclavicular adenopathy Resp: clear to auscultation bilaterally and normal percussion bilaterally Cardio: regular rate and rhythm. No gallop. Clear heart sounds. GI: abdomen obese, soft, point tender in epigastrium reproducing her symptoms, not distended, no appreciable mass or organomegaly. Normally active bowel sounds.  Musculoskeletal/ Extremities: without pitting edema, cords, tenderness. Wearing flat sandals Neuro: slight peripheral neuropathy as noted. Otherwise nonfocal Skin otherwise without rash, ecchymosis, petechiae Portacath-without erythema or tenderness  Lab Results:  Results for orders placed or performed in visit on 01/01/15  CBC with Differential  Result Value Ref Range   WBC 6.0 3.9 - 10.3 10e3/uL   NEUT# 3.8 1.5 - 6.5 10e3/uL   HGB 12.6 11.6 - 15.9 g/dL   HCT 36.6 34.8 - 46.6 %   Platelets 212 145 - 400 10e3/uL   MCV 86.3 79.5 -  101.0 fL   MCH 29.7 25.1 - 34.0 pg   MCHC 34.4 31.5 - 36.0 g/dL   RBC 4.24 3.70 - 5.45 10e6/uL   RDW 12.6 11.2 - 14.5 %   lymph# 1.9 0.9 - 3.3 10e3/uL   MONO# 0.1 0.1 - 0.9 10e3/uL   Eosinophils Absolute 0.2 0.0 - 0.5 10e3/uL   Basophils Absolute 0.0 0.0 - 0.1 10e3/uL   NEUT% 63.0 38.4 - 76.8 %   LYMPH% 32.3 14.0 - 49.7 %   MONO% 1.8 0.0 - 14.0 %   EOS% 2.7 0.0 - 7.0 %   BASO% 0.2 0.0 - 2.0 %  Comprehensive metabolic panel (Cmet) - [facility]  Result Value Ref Range   Sodium 138 136 - 145 mEq/L   Potassium 3.8 3.5 - 5.1 mEq/L   Chloride 102 98 - 109 mEq/L   CO2 27 22 - 29 mEq/L   Glucose 120 70 - 140 mg/dl   BUN 11.0 7.0 - 26.0 mg/dL   Creatinine 0.8 0.6 - 1.1 mg/dL   Total Bilirubin 1.06 0.20 - 1.20 mg/dL   Alkaline Phosphatase 52 40 - 150 U/L   AST 23 5 - 34 U/L   ALT 42 0 - 55 U/L   Total Protein 6.9 6.4 - 8.3 g/dL   Albumin 4.0 3.5 - 5.0 g/dL   Calcium 9.5 8.4 - 10.4 mg/dL  Anion Gap 8 3 - 11 mEq/L   EGFR >90 >90 ml/min/1.73 m2     Studies/Results:  No results found.  Medications: I have reviewed the patient's current medications. Compazine and zofran helpful, will request EMEND preauth if possible for next chemo (not discussed now). Fine to use prn tylenol for aches now, but will not refill dilaudid at least until close to next treatment, that preferably by this MD in limited quantities. Confirmed that she is not using naproxen and states has not used xanax recently. Add carafate tablets ac hs to present protonix for epigastric discomfort. Add topical antibiotic lotion to folliculitis areas 1-9T daily.   DISCUSSION Interval history reviewed in detail, including antiemetics,  pain management, addition of carafate as tablet (ok to crush, no liquid due to sorbital base) To use baby shampoo or hypoallergenic shampoo,  topical antibiotics as noted for folliculitis. Discussed scalp tenderness with hair loss. Discussed peripheral neuropathy as above Difficult peripheral IV  access, lab draws preferably done from [facility]. Orders in for upcoming draws, patient instructed to remind scheduling and MD if we do not get this correct on appointments in future.   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 Plan 6 cycles of q 3 week taxol carboplatin, cycle 2 given 12-26-14. No infusion reaction cycle 2, taxol aches/ nausea vomiting and peripheral neuropathy as detailed above. Counts ok today but may drop further, no documented neutropenia cycle 1. NOTE her insurance refused preauthorization for granix up front, see phone note 11-28-14 with contact # for drug to be authorized during treatment if needed.  2. Epigastric tenderness: newly symptomatic despite protonix which was started for chronic GERD symptoms at start of chemo, add carafate, avoid NSAID 3.PAC in 4. prophylactic lovenox x 4 weeks post op completed. No LE swelling now. 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 Will confirm dosing for cycle 3 at next visit depending on symptoms then. Already on gabapentin. 6.surgical menopause: hot flashes controlled with celexa and gabapentin now 7.hypothyroidism: recently diagnosed, due follow up with PCP since beginning synthroid 8. Degenerative arthritis right knee 9.morbid obesity, BMI 44. Had begun to lose weight biking prior to this diagnosis.  10.hx migraines, no problem with zofran cycle 1 11.anaphylaxis to avelox, rash to sulfa 12.folliculitis from hair loss: as above. Counts ok, no need for systemic antibiotics presently, tho Keflex would be reasonable if worsens. 13.environmental allergies controlled with prn claritin D 14.anxiety complicating other symptoms. Appreciate support from rest of [facility] staff. Message to oncology chaplain also.   All questions answered and patient is  comfortable with recommendations and plans. Time spent 35 min  Including >50% counseling and coordination of care. I will see her back 8-29 as we continue symptom management. She knows to call prior if concerns. Cycle 3 carbo taxol due 01-16-15. Cc Dr Lucianne Lei Aloha Gell, MD   01/01/2015, 8:14 PM

## 2015-01-02 ENCOUNTER — Encounter: Payer: Self-pay | Admitting: Oncology

## 2015-01-02 ENCOUNTER — Other Ambulatory Visit: Payer: Self-pay | Admitting: Oncology

## 2015-01-02 DIAGNOSIS — R112 Nausea with vomiting, unspecified: Secondary | ICD-10-CM | POA: Insufficient documentation

## 2015-01-02 DIAGNOSIS — G620 Drug-induced polyneuropathy: Secondary | ICD-10-CM | POA: Insufficient documentation

## 2015-01-02 DIAGNOSIS — T451X5A Adverse effect of antineoplastic and immunosuppressive drugs, initial encounter: Secondary | ICD-10-CM | POA: Insufficient documentation

## 2015-01-02 DIAGNOSIS — F419 Anxiety disorder, unspecified: Secondary | ICD-10-CM | POA: Insufficient documentation

## 2015-01-02 NOTE — Progress Notes (Signed)
Medical Oncology  OK for EMEND with subsequent chemo per managed care  L.Marko Plume, MD

## 2015-01-04 ENCOUNTER — Other Ambulatory Visit: Payer: Self-pay | Admitting: Oncology

## 2015-01-05 ENCOUNTER — Encounter: Payer: Self-pay | Admitting: Genetic Counselor

## 2015-01-05 ENCOUNTER — Ambulatory Visit (HOSPITAL_BASED_OUTPATIENT_CLINIC_OR_DEPARTMENT_OTHER): Payer: BLUE CROSS/BLUE SHIELD

## 2015-01-05 ENCOUNTER — Encounter: Payer: Self-pay | Admitting: Oncology

## 2015-01-05 ENCOUNTER — Telehealth: Payer: Self-pay | Admitting: Genetic Counselor

## 2015-01-05 ENCOUNTER — Other Ambulatory Visit (HOSPITAL_BASED_OUTPATIENT_CLINIC_OR_DEPARTMENT_OTHER): Payer: BLUE CROSS/BLUE SHIELD

## 2015-01-05 ENCOUNTER — Encounter: Payer: Self-pay | Admitting: General Practice

## 2015-01-05 ENCOUNTER — Ambulatory Visit (HOSPITAL_BASED_OUTPATIENT_CLINIC_OR_DEPARTMENT_OTHER): Payer: BLUE CROSS/BLUE SHIELD | Admitting: Oncology

## 2015-01-05 VITALS — BP 125/77 | HR 88 | Temp 98.2°F | Resp 20 | Ht 68.0 in | Wt 286.1 lb

## 2015-01-05 DIAGNOSIS — C561 Malignant neoplasm of right ovary: Secondary | ICD-10-CM | POA: Diagnosis not present

## 2015-01-05 DIAGNOSIS — Z95828 Presence of other vascular implants and grafts: Secondary | ICD-10-CM

## 2015-01-05 DIAGNOSIS — R112 Nausea with vomiting, unspecified: Secondary | ICD-10-CM

## 2015-01-05 DIAGNOSIS — T451X5A Adverse effect of antineoplastic and immunosuppressive drugs, initial encounter: Secondary | ICD-10-CM

## 2015-01-05 DIAGNOSIS — Z5111 Encounter for antineoplastic chemotherapy: Secondary | ICD-10-CM

## 2015-01-05 DIAGNOSIS — G620 Drug-induced polyneuropathy: Secondary | ICD-10-CM

## 2015-01-05 DIAGNOSIS — R10816 Epigastric abdominal tenderness: Secondary | ICD-10-CM | POA: Diagnosis not present

## 2015-01-05 DIAGNOSIS — R110 Nausea: Secondary | ICD-10-CM

## 2015-01-05 DIAGNOSIS — F411 Generalized anxiety disorder: Secondary | ICD-10-CM

## 2015-01-05 DIAGNOSIS — C562 Malignant neoplasm of left ovary: Secondary | ICD-10-CM

## 2015-01-05 DIAGNOSIS — Z1379 Encounter for other screening for genetic and chromosomal anomalies: Secondary | ICD-10-CM | POA: Insufficient documentation

## 2015-01-05 DIAGNOSIS — L739 Follicular disorder, unspecified: Secondary | ICD-10-CM

## 2015-01-05 DIAGNOSIS — C563 Malignant neoplasm of bilateral ovaries: Secondary | ICD-10-CM

## 2015-01-05 DIAGNOSIS — K219 Gastro-esophageal reflux disease without esophagitis: Secondary | ICD-10-CM

## 2015-01-05 DIAGNOSIS — O00209 Unspecified ovarian pregnancy without intrauterine pregnancy: Secondary | ICD-10-CM

## 2015-01-05 DIAGNOSIS — Z1211 Encounter for screening for malignant neoplasm of colon: Secondary | ICD-10-CM | POA: Insufficient documentation

## 2015-01-05 LAB — COMPREHENSIVE METABOLIC PANEL (CC13)
ALBUMIN: 3.6 g/dL (ref 3.5–5.0)
ALK PHOS: 51 U/L (ref 40–150)
ALT: 29 U/L (ref 0–55)
AST: 19 U/L (ref 5–34)
Anion Gap: 8 mEq/L (ref 3–11)
BUN: 6.8 mg/dL — ABNORMAL LOW (ref 7.0–26.0)
CO2: 26 mEq/L (ref 22–29)
Calcium: 9.2 mg/dL (ref 8.4–10.4)
Chloride: 107 mEq/L (ref 98–109)
Creatinine: 0.7 mg/dL (ref 0.6–1.1)
GLUCOSE: 124 mg/dL (ref 70–140)
POTASSIUM: 3.4 meq/L — AB (ref 3.5–5.1)
SODIUM: 141 meq/L (ref 136–145)
Total Bilirubin: 0.88 mg/dL (ref 0.20–1.20)
Total Protein: 6.4 g/dL (ref 6.4–8.3)

## 2015-01-05 LAB — CBC WITH DIFFERENTIAL/PLATELET
BASO%: 0.2 % (ref 0.0–2.0)
BASOS ABS: 0 10*3/uL (ref 0.0–0.1)
EOS ABS: 0.2 10*3/uL (ref 0.0–0.5)
EOS%: 3.1 % (ref 0.0–7.0)
HCT: 34.8 % (ref 34.8–46.6)
HEMOGLOBIN: 11.8 g/dL (ref 11.6–15.9)
LYMPH%: 26.2 % (ref 14.0–49.7)
MCH: 29.5 pg (ref 25.1–34.0)
MCHC: 33.9 g/dL (ref 31.5–36.0)
MCV: 87 fL (ref 79.5–101.0)
MONO#: 0.5 10*3/uL (ref 0.1–0.9)
MONO%: 10.1 % (ref 0.0–14.0)
NEUT#: 3.2 10*3/uL (ref 1.5–6.5)
NEUT%: 60.4 % (ref 38.4–76.8)
Platelets: 263 10*3/uL (ref 145–400)
RBC: 4 10*6/uL (ref 3.70–5.45)
RDW: 13 % (ref 11.2–14.5)
WBC: 5.2 10*3/uL (ref 3.9–10.3)
lymph#: 1.4 10*3/uL (ref 0.9–3.3)

## 2015-01-05 MED ORDER — SODIUM CHLORIDE 0.9 % IJ SOLN
10.0000 mL | INTRAMUSCULAR | Status: DC | PRN
  Administered 2015-01-05: 10 mL via INTRAVENOUS
  Filled 2015-01-05: qty 10

## 2015-01-05 MED ORDER — HEPARIN SOD (PORK) LOCK FLUSH 100 UNIT/ML IV SOLN
500.0000 [IU] | Freq: Once | INTRAVENOUS | Status: AC
  Administered 2015-01-05: 500 [IU] via INTRAVENOUS
  Filled 2015-01-05: qty 5

## 2015-01-05 NOTE — Telephone Encounter (Signed)
Revealed negative genetic testing.  Paitent updated her family history, as she went to a family reunion recently and found that she has a distant cousin who had ovarian cancer at age 44.  She does not know if this cousin had genetic testing.  Discussed that her testing is negative, and therefore there could be a mutation that was missed by our technology or possibly a gene that we have not identified yet.  She should keep in contact with our department.

## 2015-01-05 NOTE — Progress Notes (Signed)
Spiritual Care Note  Received referral from Dr Marko Plume for emotional support.  Unable to reach pt by phone, but left voicemail.  Will continue to attempt to reach her.  Please also page as needs arise.  Thank you.  [place], [place] Pager [phone] Voicemail  [phone]

## 2015-01-05 NOTE — Progress Notes (Signed)
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 She had cycle 2 carboplatin taxol on 12-26-14; she has not required gCSF support.   Patient is still fatigued, but nausea and taxol aches have improved since last week. Carafate has been very helpful for epigastric discomfort, which also seems to have helped nausea.  Folliculitis on scalp is improving. She has new tender areas on left neck and feels that she is retaining fluid.  She was able to go shopping with boyfriend and his 2 middle school aged children over weekend. She has had no fever. She has minimal sore throat deep on left. Bowels moved today. She has no peripheral neuropathy.    PAC placed by IR 12-04-14 Genetics testing sent 12-17-14 and pending Pre op CA 125 554  Per Dr Thurston Pounds, ok to resume biking, tho she has not felt energetic enough to try that yet. Per [facility], EMEND approved.   ONCOLOGIC HISTORY Patient had history of endometriosis and infertility, with no abnormal PAP smears but no recent gyn exams. She was in usual health until a few weeks of intermittent low grade nausea, then <24 hours of progressively more severe right upper quadrant abdominal pain. She presented to [facility] ED with the abdominal pain on 11-09-14, with abdominal US not remarkable, then CT AP demonstrated bilateral solid and cystic ovarian masses in total measuring 14 x 10 x 11 cm. She was transferred to [facility] Dr Thurston Pounds, with preoperative CA 125 of 554. On 11-10-14 she had exploratory laparotomy with supracervical hysterectomy, BSO, infracolic omentectomy, bilateral pelvic lymphadenectomy and aortic node sampling. Surgical findings were significant for chocolate colored ascites consistent with ruptured endometrioma, frozen pelvis  consistent with stage IV endometriosis and bilateral retroperitoneal fibrosis requiring bilateral ureterolysis, sigmoid colon densely adherent to uterus and cerix, precluding cervical excision, normal abdominal survey. . Surgical pathology from [facility] [id number]) from 11-10-14 had grade 3 clear cell carcinoma in 6 cm right ovary and 11 cm left ovary with capsules intact, bilateral fallopian tubes not involved, uterus and omentum not involved, no LVSI, for stage IB. No malignant cells in ascites or peritoneal washings. Her postoperative course was uncomplicated. [facility] multidisciplinary conference recommended 6 cycles of adjuvant taxol carboplatin. She will have 6 week post op visit with Dr Thurston Pounds. First carbo taxol was given 08-01-69, complicated by taxol reaction despite full premedication, and severe taxol aches.    Review of systems as above, also: Using baby shampoo and topical antibiotic to folliculitis on scalp. No ear discomfort. "skin sore and tight". Remainder of 10 point Review of Systems negative.  Objective:  Vital signs in last 24 hours: Weight up 6 lbs to 286, 125/77, 88 regular, 20 not labored RA, 98.2, 100% sat.  Alert, oriented and appropriate. Ambulatory without assistance . Looks comfortable, very pleasant. Partial alopecia  HEENT:PERRL, sclerae not icteric. Oral mucosa moist without lesions, posterior pharynx clear.  Neck supple. No JVD. Scattered folliculitis lesions on scalp appear to be improving, less erythema and all smaller.  Lymphatics:2 lower posterior cervical nodes each 1x2 cm, not firm, slightly tender.  No supraclavicular adenopathy Resp: clear to auscultation bilaterally and normal percussion bilaterally Cardio: regular rate and rhythm. No gallop. GI: abdomen obese, soft, nontender, not distended, no mass or organomegaly. Normally active bowel sounds. Surgical  incision not remarkable, the cyst-like area deep to inferior part of scar less prominent and not tender  now. Musculoskeletal/ Extremities: without pitting edema, cords, tenderness Neuro: no peripheral neuropathy. Otherwise nonfocal. PSYCH appropriate mood and affect Skin without rash, ecchymosis, petechiae Portacath-without erythema or tenderness  Lab Results:  Results for orders placed or performed in visit on 01/01/15  CBC with Differential  Result Value Ref Range   WBC 6.0 3.9 - 10.3 10e3/uL   NEUT# 3.8 1.5 - 6.5 10e3/uL   HGB 12.6 11.6 - 15.9 g/dL   HCT 36.6 34.8 - 46.6 %   Platelets 212 145 - 400 10e3/uL   MCV 86.3 79.5 - 101.0 fL   MCH 29.7 25.1 - 34.0 pg   MCHC 34.4 31.5 - 36.0 g/dL   RBC 4.24 3.70 - 5.45 10e6/uL   RDW 12.6 11.2 - 14.5 %   lymph# 1.9 0.9 - 3.3 10e3/uL   MONO# 0.1 0.1 - 0.9 10e3/uL   Eosinophils Absolute 0.2 0.0 - 0.5 10e3/uL   Basophils Absolute 0.0 0.0 - 0.1 10e3/uL   NEUT% 63.0 38.4 - 76.8 %   LYMPH% 32.3 14.0 - 49.7 %   MONO% 1.8 0.0 - 14.0 %   EOS% 2.7 0.0 - 7.0 %   BASO% 0.2 0.0 - 2.0 %  Comprehensive metabolic panel (Cmet) - [facility]  Result Value Ref Range   Sodium 138 136 - 145 mEq/L   Potassium 3.8 3.5 - 5.1 mEq/L   Chloride 102 98 - 109 mEq/L   CO2 27 22 - 29 mEq/L   Glucose 120 70 - 140 mg/dl   BUN 11.0 7.0 - 26.0 mg/dL   Creatinine 0.8 0.6 - 1.1 mg/dL   Total Bilirubin 1.06 0.20 - 1.20 mg/dL   Alkaline Phosphatase 52 40 - 150 U/L   AST 23 5 - 34 U/L   ALT 42 0 - 55 U/L   Total Protein 6.9 6.4 - 8.3 g/dL   Albumin 4.0 3.5 - 5.0 g/dL   Calcium 9.5 8.4 - 10.4 mg/dL   Anion Gap 8 3 - 11 mEq/L   EGFR >90 >90 ml/min/1.73 m2     Studies/Results:  No results found.  Medications: I have reviewed the patient's current medications. Continue Carafate. Add EMEND to next chemo. She has not requested dilaudid today, tho will need to discuss this at visit just prior to cycle 3.  DISCUSSION: Carafate and EMEND as noted. Discussed  fatigue as frequent side effect of chemotherapy, so will need to adjust activity, but should not be completely  sedentary. Will try warm soaks to left neck, but I cannot tell indication for antibiotics now.  Needs to strictly avoid salt if retaining fluid, this also may be causing "skin soreness". She will drink water in preference to gatorade now.  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 Plan 6 cycles of q 3 week taxol carboplatin, cycle 2 given 12-26-14, overall tolerated some better than cycle 1. Counts still in good range without gCSF. I will see her 9-6 prior to cycle 3 on 01-16-15.  NOTE her insurance refused preauthorization for granix up front, see phone note 11-28-14 with contact # for drug to be authorized during treatment if needed.  2. Epigastric tenderness: very symptomatic despite protonix which was started for chronic GERD symptoms at start of chemo, much improved with addition of carafate, which she will continue. The GERD may have worsened chemo nausea. 3.PAC in 4.  prophylactic lovenox x 4 weeks post op completed. No LE swelling now. 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 Will confirm dosing for cycle 3 at next visit depending on symptoms then. Already on gabapentin. 6.surgical menopause: hot flashes controlled with celexa and gabapentin now 7.hypothyroidism: recently diagnosed, due follow up with PCP since beginning synthroid 8. Degenerative arthritis right knee 9.morbid obesity, BMI 44. Had begun to lose weight biking prior to this diagnosis.  10.hx migraines, no problem with zofran cycle 1 11.anaphylaxis to avelox, rash to sulfa 12.folliculitis from hair loss: improving. Continue baby shampoo and topical antibiotic. Warm soaks to left neck and follow what may be reactive adenopathy. 13.environmental allergies controlled with prn claritin D 14.anxiety complicating other symptoms, tho some better  today. Appreciate support from rest of [facility] staff. Oncology chaplain aware and will be in touch with her.   All questions answered and patient knows to call if concerns prior to next scheduled visit. Time spent 25 min including >50% counseling and coordination of care.    Airika Alkhatib P, MD   01/05/2015, 9:08 AM

## 2015-01-06 ENCOUNTER — Telehealth: Payer: Self-pay | Admitting: Internal Medicine

## 2015-01-06 DIAGNOSIS — L739 Follicular disorder, unspecified: Secondary | ICD-10-CM | POA: Insufficient documentation

## 2015-01-06 NOTE — Telephone Encounter (Signed)
Patient would like to know when her last tetanus shot was, please advise.

## 2015-01-06 NOTE — Telephone Encounter (Signed)
Pt is requesting MD advisement on if it is safe for her to have a Tetanus vaccination while doing chemotherapy? Pt is gong on a camping trip this upcoming weekend and it is required

## 2015-01-06 NOTE — Telephone Encounter (Signed)
OK to have this as there is no specific reason not to, unless she is allergic to it  Can make nurse appt for this if needed

## 2015-01-07 ENCOUNTER — Ambulatory Visit: Payer: Self-pay | Admitting: Genetic Counselor

## 2015-01-07 DIAGNOSIS — Z1379 Encounter for other screening for genetic and chromosomal anomalies: Secondary | ICD-10-CM

## 2015-01-07 DIAGNOSIS — C562 Malignant neoplasm of left ovary: Principal | ICD-10-CM

## 2015-01-07 DIAGNOSIS — C561 Malignant neoplasm of right ovary: Secondary | ICD-10-CM

## 2015-01-07 DIAGNOSIS — C563 Malignant neoplasm of bilateral ovaries: Secondary | ICD-10-CM

## 2015-01-07 NOTE — Progress Notes (Signed)
HPI: [name] was previously seen in the [place] clinic due to a personal history of ovarian cancer and concerns regarding a hereditary predisposition to cancer. Please refer to our prior cancer genetics clinic note for more information regarding [name]'s medical, social and family histories, and our assessment and recommendations, at the time. [name] recent genetic test results were disclosed to her, as were recommendations warranted by these results. These results and recommendations are discussed in more detail below.  FAMILY HISTORY:  We obtained a detailed, 4-generation family history.  Significant diagnoses are listed below: Family History  Problem Relation Age of Onset  . Hypertension Mother   . Diabetes Mother   . Atrial fibrillation Father   . Diabetes Maternal Aunt   . Heart failure Maternal Grandmother   . Heart attack Maternal Grandfather   . Dementia Paternal Grandmother   . Dementia Paternal Grandfather     GENETIC TEST RESULTS: At the time of [name]'s visit, we recommended she pursue genetic testing of the OvaNext gene panel. The OvaNext gene panel offered by [facility] and includes sequencing and rearrangement analysis for the following 24 genes: ATM, BARD1, BRCA1, BRCA2, BRIP1, CDH1, CHEK2, EPCAM, MLH1, MRE11A, MSH2, MSH6, MUTYH, NBN, NF1, PALB2, PMS2, PTEN, RAD50, RAD51C, RAD51D, SMARCA4, STK11, and TP53.  The report date is January 05, 2015.  Genetic testing was normal, and did not reveal a deleterious mutation in these genes. The test report has been scanned into EPIC and is located under the Molecular Pathology section of the Results Review tab.   We discussed with [name] that since the current genetic testing is not perfect, it is possible there may be a gene mutation in one of these genes that current testing cannot detect, but that chance is small. We also discussed, that it is possible that another gene that has not  yet been discovered, or that we have not yet tested, is responsible for the cancer diagnoses in the family, and it is, therefore, important to remain in touch with cancer genetics in the future so that we can continue to offer [name] the most up to date genetic testing.   CANCER SCREENING RECOMMENDATIONS: This result is reassuring and indicates that [name] does not have an increased risk for a future cancer due to a mutation in one of these genes. This normal test also suggests that [name]'s cancer was most Hall not due to an inherited predisposition associated with one of these genes.  Most cancers happen by chance and this negative test suggests that her cancer falls into this category.  We, therefore, recommended she continue to follow the cancer management and screening guidelines provided by her oncology and primary healthcare provider.   FOLLOW-UP: Lastly, we discussed with [name] that cancer genetics is a rapidly advancing field and it is possible that new genetic tests will be appropriate for her and/or her family members in the future. We encouraged her to remain in contact with cancer genetics on an annual basis so we can update her personal and family histories and let her know of advances in cancer genetics that may benefit this family.   Our contact number was provided. [name] questions were answered to her satisfaction, and she knows she is welcome to call us at anytime with additional questions or concerns.   Roma Kayser, MS, [facility] Certified Genetic Counselor [email or web address]

## 2015-01-07 NOTE — Telephone Encounter (Signed)
Pt advised via VM 

## 2015-01-11 ENCOUNTER — Other Ambulatory Visit: Payer: Self-pay | Admitting: Oncology

## 2015-01-11 DIAGNOSIS — C563 Malignant neoplasm of bilateral ovaries: Secondary | ICD-10-CM

## 2015-01-11 DIAGNOSIS — C562 Malignant neoplasm of left ovary: Principal | ICD-10-CM

## 2015-01-11 DIAGNOSIS — C561 Malignant neoplasm of right ovary: Secondary | ICD-10-CM

## 2015-01-13 ENCOUNTER — Ambulatory Visit: Payer: BLUE CROSS/BLUE SHIELD | Admitting: Oncology

## 2015-01-13 ENCOUNTER — Other Ambulatory Visit: Payer: BLUE CROSS/BLUE SHIELD

## 2015-01-13 ENCOUNTER — Telehealth: Payer: Self-pay | Admitting: Oncology

## 2015-01-13 NOTE — Telephone Encounter (Signed)
Confirmed appointment for 09/08

## 2015-01-15 ENCOUNTER — Ambulatory Visit (HOSPITAL_BASED_OUTPATIENT_CLINIC_OR_DEPARTMENT_OTHER): Payer: BLUE CROSS/BLUE SHIELD | Admitting: Oncology

## 2015-01-15 ENCOUNTER — Encounter: Payer: Self-pay | Admitting: Oncology

## 2015-01-15 ENCOUNTER — Other Ambulatory Visit (HOSPITAL_BASED_OUTPATIENT_CLINIC_OR_DEPARTMENT_OTHER): Payer: BLUE CROSS/BLUE SHIELD

## 2015-01-15 ENCOUNTER — Ambulatory Visit (HOSPITAL_BASED_OUTPATIENT_CLINIC_OR_DEPARTMENT_OTHER): Payer: BLUE CROSS/BLUE SHIELD

## 2015-01-15 VITALS — BP 113/66 | HR 79 | Temp 99.9°F | Resp 18 | Ht 68.0 in | Wt 284.0 lb

## 2015-01-15 DIAGNOSIS — Z5111 Encounter for antineoplastic chemotherapy: Secondary | ICD-10-CM

## 2015-01-15 DIAGNOSIS — C561 Malignant neoplasm of right ovary: Secondary | ICD-10-CM | POA: Diagnosis not present

## 2015-01-15 DIAGNOSIS — C562 Malignant neoplasm of left ovary: Secondary | ICD-10-CM

## 2015-01-15 DIAGNOSIS — T451X5A Adverse effect of antineoplastic and immunosuppressive drugs, initial encounter: Secondary | ICD-10-CM

## 2015-01-15 DIAGNOSIS — E039 Hypothyroidism, unspecified: Secondary | ICD-10-CM

## 2015-01-15 DIAGNOSIS — C563 Malignant neoplasm of bilateral ovaries: Secondary | ICD-10-CM

## 2015-01-15 DIAGNOSIS — L739 Follicular disorder, unspecified: Secondary | ICD-10-CM

## 2015-01-15 DIAGNOSIS — R10816 Epigastric abdominal tenderness: Secondary | ICD-10-CM

## 2015-01-15 DIAGNOSIS — G620 Drug-induced polyneuropathy: Secondary | ICD-10-CM

## 2015-01-15 DIAGNOSIS — K219 Gastro-esophageal reflux disease without esophagitis: Secondary | ICD-10-CM

## 2015-01-15 DIAGNOSIS — Z95828 Presence of other vascular implants and grafts: Secondary | ICD-10-CM

## 2015-01-15 DIAGNOSIS — R110 Nausea: Secondary | ICD-10-CM

## 2015-01-15 DIAGNOSIS — F411 Generalized anxiety disorder: Secondary | ICD-10-CM

## 2015-01-15 LAB — COMPREHENSIVE METABOLIC PANEL (CC13)
ALT: 46 U/L (ref 0–55)
ANION GAP: 8 meq/L (ref 3–11)
AST: 27 U/L (ref 5–34)
Albumin: 3.8 g/dL (ref 3.5–5.0)
Alkaline Phosphatase: 52 U/L (ref 40–150)
BUN: 6.9 mg/dL — ABNORMAL LOW (ref 7.0–26.0)
CALCIUM: 9.5 mg/dL (ref 8.4–10.4)
CHLORIDE: 106 meq/L (ref 98–109)
CO2: 27 mEq/L (ref 22–29)
CREATININE: 0.8 mg/dL (ref 0.6–1.1)
EGFR: 90 mL/min/{1.73_m2} — AB (ref >90)
Glucose: 105 mg/dl (ref 70–140)
POTASSIUM: 3.8 meq/L (ref 3.5–5.1)
Sodium: 141 mEq/L (ref 136–145)
Total Bilirubin: 0.76 mg/dL (ref 0.20–1.20)
Total Protein: 7 g/dL (ref 6.4–8.3)

## 2015-01-15 LAB — CBC WITH DIFFERENTIAL/PLATELET
BASO%: 0.4 % (ref 0.0–2.0)
BASOS ABS: 0 10*3/uL (ref 0.0–0.1)
EOS ABS: 0.2 10*3/uL (ref 0.0–0.5)
EOS%: 1.4 % (ref 0.0–7.0)
HEMATOCRIT: 37.6 % (ref 34.8–46.6)
HGB: 12.7 g/dL (ref 11.6–15.9)
LYMPH#: 2.5 10*3/uL (ref 0.9–3.3)
LYMPH%: 22.1 % (ref 14.0–49.7)
MCH: 29.3 pg (ref 25.1–34.0)
MCHC: 33.7 g/dL (ref 31.5–36.0)
MCV: 86.9 fL (ref 79.5–101.0)
MONO#: 0.9 10*3/uL (ref 0.1–0.9)
MONO%: 8 % (ref 0.0–14.0)
NEUT#: 7.8 10*3/uL — ABNORMAL HIGH (ref 1.5–6.5)
NEUT%: 68.1 % (ref 38.4–76.8)
PLATELETS: 347 10*3/uL (ref 145–400)
RBC: 4.33 10*6/uL (ref 3.70–5.45)
RDW: 14.1 % (ref 11.2–14.5)
WBC: 11.4 10*3/uL — ABNORMAL HIGH (ref 3.9–10.3)

## 2015-01-15 LAB — TECHNOLOGIST REVIEW

## 2015-01-15 MED ORDER — SODIUM CHLORIDE 0.9 % IJ SOLN
10.0000 mL | INTRAMUSCULAR | Status: DC | PRN
  Administered 2015-01-15: 10 mL via INTRAVENOUS
  Filled 2015-01-15: qty 10

## 2015-01-15 MED ORDER — CEPHALEXIN 500 MG PO CAPS: 500.0000 mg | ORAL_CAPSULE | Freq: Four times a day (QID) | ORAL | Status: DC

## 2015-01-15 MED ORDER — HEPARIN SOD (PORK) LOCK FLUSH 100 UNIT/ML IV SOLN
500.0000 [IU] | Freq: Once | INTRAVENOUS | Status: AC
  Administered 2015-01-15: 500 [IU] via INTRAVENOUS
  Filled 2015-01-15: qty 5

## 2015-01-15 NOTE — Patient Instructions (Signed)

## 2015-01-15 NOTE — Progress Notes (Signed)
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 She is fatigued today, but rode 10 hours to and from [place] this past weekend.  She has not had gCSF.  Patient is generally fatigued, not specifically dyspneic on exertion or equivalent. She is able to sleep. She has had no fever, however temperature is 99 now. The mild scalp folliculitis has not resolved with topical antibiotic ointment; slightly tender nodes left neck are better but not resolved. She has insect bite lateral left leg, a little red. GERD is essentially resolved with protonix. No nausea now. Skin on legs "sore to touch" thruout, and some aching still at times in LE as she had following taxol. She has slight neuropathy left thumb and tips of fingers bilaterally, which is noticeable but does not really preclude fine motor activities. She had some tingling on bottoms of feet bilaterally, not interfering with ambulation and not painful. She denies bleeding. She has no abdominal or pelvic pain.   PAC placed by IR 12-04-14 Genetics testing sent 12-17-14 no mutation on OvaNext panel by [facility] Pre op CA 125 [place] Patient had history of endometriosis and infertility, with no abnormal PAP smears but no recent gyn exams. She was in usual health until a few weeks of intermittent low grade nausea, then <24 hours of progressively more severe right upper quadrant abdominal pain. She presented to [facility] ED with the abdominal pain on 11-09-14, with abdominal US not remarkable, then CT AP demonstrated bilateral solid and cystic ovarian masses in total measuring 14 x 10 x 11 cm. She was transferred to [facility] Dr Thurston Pounds, with preoperative CA 125 of 554. On 11-10-14 she had  exploratory laparotomy with supracervical hysterectomy, BSO, infracolic omentectomy, bilateral pelvic lymphadenectomy and aortic node sampling. 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 . Surgical pathology from [facility] [phone]) from 11-10-14 had grade 3 clear cell carcinoma in 6 cm right ovary and 11 cm left ovary with capsules intact, bilateral fallopian tubes not involved, uterus and omentum not involved, no LVSI, for stage IB. No malignant cells in ascites or peritoneal washings. Her postoperative course was uncomplicated. [facility] multidisciplinary conference recommended 6 cycles of adjuvant taxol carboplatin. She will have 6 week post op visit with Dr Thurston Pounds. First carbo taxol was given 7-42-59, complicated by taxol reaction despite full premedication, and severe taxol aches.    Review of systems as above, also: No problems with PAC. No obvious swelling LE, no cords. Voiding ok.  Remainder of 10 point Review of Systems negative.  Objective:  Vital signs in last 24 hours:  BP 113/66 mmHg  Pulse 79  Temp(Src) 99.9 F (37.7 C) (Oral)  Resp 18  Ht '5\' 8"'  (1.727 m)  Wt 284 lb (128.822 kg)  BMI 43.19 kg/m2  LMP 11/02/2014 Weight down 2 lbs. Respirations not labored RA, does not appear in any distress. Alert, oriented and appropriate, very pleasant. Ambulatory without assistance.  Not quite complete alopecia.  HEENT:PERRL, sclerae not icteric. Oral mucosa moist without lesions, posterior pharynx clear.  Neck supple.  No JVD. Scalp with scattered areas of folliculitis, stable from last exam, none draining and no increased erythema compared with last exam. Lymphatics left posterior cervical nodes less  prominent but not resolved, :no suraclavicular, axillary adenopathy Resp: clear to auscultation bilaterally and normal percussion bilaterally Cardio: regular rate and rhythm. No gallop. GI: abdomen obese, soft, nontender, not distended, cannot appreciate mass or organomegaly. Normally active bowel sounds. Surgical incision not remarkable. Musculoskeletal/ Extremities: without pitting edema, cords, tenderness Neuro: no peripheral neuropathy. Otherwise nonfocal Skin folliculitis on scalp as above. Insect bite lateral right leg with erythema ~ 1.5 cm and central excoriated area. Otherwise without rash, ecchymosis, petechiae Portacath-without erythema or tenderness  Lab Results:  Results for orders placed or performed in visit on 01/15/15  TECHNOLOGIST REVIEW  Result Value Ref Range   Technologist Review 1% Blast, 3% metas, 4% myelocytes   CBC with Differential  Result Value Ref Range   WBC 11.4 (H) 3.9 - 10.3 10e3/uL   NEUT# 7.8 (H) 1.5 - 6.5 10e3/uL   HGB 12.7 11.6 - 15.9 g/dL   HCT 37.6 34.8 - 46.6 %   Platelets 347 145 - 400 10e3/uL   MCV 86.9 79.5 - 101.0 fL   MCH 29.3 25.1 - 34.0 pg   MCHC 33.7 31.5 - 36.0 g/dL   RBC 4.33 3.70 - 5.45 10e6/uL   RDW 14.1 11.2 - 14.5 %   lymph# 2.5 0.9 - 3.3 10e3/uL   MONO# 0.9 0.1 - 0.9 10e3/uL   Eosinophils Absolute 0.2 0.0 - 0.5 10e3/uL   Basophils Absolute 0.0 0.0 - 0.1 10e3/uL   NEUT% 68.1 38.4 - 76.8 %   LYMPH% 22.1 14.0 - 49.7 %   MONO% 8.0 0.0 - 14.0 %   EOS% 1.4 0.0 - 7.0 %   BASO% 0.4 0.0 - 2.0 %  Comprehensive metabolic panel (Cmet) - [facility]  Result Value Ref Range   Sodium 141 136 - 145 mEq/L   Potassium 3.8 3.5 - 5.1 mEq/L   Chloride 106 98 - 109 mEq/L   CO2 27 22 - 29 mEq/L   Glucose 105 70 - 140 mg/dl   BUN 6.9 (L) 7.0 - 26.0 mg/dL   Creatinine 0.8 0.6 - 1.1 mg/dL   Total Bilirubin 0.76 0.20 - 1.20 mg/dL   Alkaline Phosphatase 52 40 - 150 U/L   AST 27 5 - 34 U/L   ALT 46 0 - 55 U/L   Total Protein 7.0 6.4 - 8.3 g/dL    Albumin 3.8 3.5 - 5.0 g/dL   Calcium 9.5 8.4 - 10.4 mg/dL   Anion Gap 8 3 - 11 mEq/L   EGFR 90 (L) >90 ml/min/1.73 m2   notified of morphology above by lab following visit.  Studies/Results:  No results found.  Medications: I have reviewed the patient's current medications. EMEND added to premedications for 01-16-15. WIth folliculitis not resolved and still left cervical adenopathy, will add Keflex 500 mg q 6 hr x 1 week. Patient tells me now that she has enough pain medication for taxol aches this cycle; she cannot tell me actual # tablets.  NOTE any further prescriptions for pain medication need to be for limited amounts.  DISCUSSION Careful discussion of benefit of taxol for the cancer vs peripheral neuropathy. She agrees that present symptoms of peripheral neuropathy are not bothersome to the point that she would need to stop taxol, tho she understands that these could worsen with additional doses and may not resolve entirely after treatment. She is  again encouraged to use least possible pain medication and reminded about hot showers/ heating pads. She may have slight LE lymphedema causing the "skin soreness", may benefit from lymphedema PT when chemo completes, or prior if needed  Shift to immature cells on peripheral blood smear may be bone marrow stress from chemo; she has not had gCSF. Will follow, but will not delay treatment now.  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 Plan 6 cycles of q 3 week taxol carboplatin. Cycle 3 on 01-16-15 with addition of EMEND. Has not needed gCSF thus far NOTE her insurance refused preauthorization for granix up front, see phone note 11-28-14 with contact # for drug to be authorized during treatment if needed.  2. Epigastric tenderness: much improved with addition of carafate, which she will continue. The GERD may have worsened chemo nausea additionally 3.PAC in 4. Scalp folliculitis  persists, with slight left cervical lymphadenopathy likely related, also irritated insect bite on leg. Add Keflex x 7 days. 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  Already on gabapentin. 6.surgical menopause: hot flashes controlled with celexa and gabapentin now 7.hypothyroidism: recently diagnosed, due follow up with PCP since beginning synthroid 8. Degenerative arthritis right knee 9.morbid obesity, BMI 44. Had begun to lose weight biking prior to this diagnosis.  10.hx migraines, no problem with zofran cycle 1 11.anaphylaxis to avelox, rash to sulfa 12.possible slight lymphedema LE related to pelvic surgery. May need referral to lymphedema PT.  13.environmental allergies controlled with prn claritin D 14.anxiety complicating other symptoms: appreciate [facility] chaplain following   I will see her back with lab on 01-22-15. All questions answered. Chemo orders confirmed. Time spent 25 min including >50% counseling and coordination of care.    Gordy Levan, MD   01/15/2015, 5:26 PM

## 2015-01-16 ENCOUNTER — Other Ambulatory Visit: Payer: BLUE CROSS/BLUE SHIELD

## 2015-01-16 ENCOUNTER — Encounter: Payer: BLUE CROSS/BLUE SHIELD | Admitting: Nutrition

## 2015-01-16 ENCOUNTER — Ambulatory Visit (HOSPITAL_BASED_OUTPATIENT_CLINIC_OR_DEPARTMENT_OTHER): Payer: BLUE CROSS/BLUE SHIELD

## 2015-01-16 VITALS — BP 111/79 | HR 102 | Temp 98.6°F | Resp 18

## 2015-01-16 DIAGNOSIS — C562 Malignant neoplasm of left ovary: Secondary | ICD-10-CM | POA: Diagnosis not present

## 2015-01-16 DIAGNOSIS — C561 Malignant neoplasm of right ovary: Secondary | ICD-10-CM

## 2015-01-16 DIAGNOSIS — Z5111 Encounter for antineoplastic chemotherapy: Secondary | ICD-10-CM

## 2015-01-16 DIAGNOSIS — C563 Malignant neoplasm of bilateral ovaries: Secondary | ICD-10-CM

## 2015-01-16 MED ORDER — FAMOTIDINE IN NACL 20-0.9 MG/50ML-% IV SOLN
20.0000 mg | Freq: Once | INTRAVENOUS | Status: AC
  Administered 2015-01-16: 20 mg via INTRAVENOUS

## 2015-01-16 MED ORDER — FOSAPREPITANT DIMEGLUMINE INJECTION 150 MG
Freq: Once | INTRAVENOUS | Status: AC
  Administered 2015-01-16: 10:00:00 via INTRAVENOUS
  Filled 2015-01-16: qty 5

## 2015-01-16 MED ORDER — FAMOTIDINE IN NACL 20-0.9 MG/50ML-% IV SOLN
INTRAVENOUS | Status: AC
  Filled 2015-01-16: qty 50

## 2015-01-16 MED ORDER — LORAZEPAM 2 MG/ML IJ SOLN
1.0000 mg | Freq: Once | INTRAMUSCULAR | Status: DC | PRN
  Administered 2015-01-16: 1 mg via INTRAVENOUS

## 2015-01-16 MED ORDER — SODIUM CHLORIDE 0.9 % IV SOLN
Freq: Once | INTRAVENOUS | Status: AC
  Administered 2015-01-16: 10:00:00 via INTRAVENOUS

## 2015-01-16 MED ORDER — SODIUM CHLORIDE 0.9 % IV SOLN
Freq: Once | INTRAVENOUS | Status: AC
  Administered 2015-01-16: 11:00:00 via INTRAVENOUS
  Filled 2015-01-16: qty 8

## 2015-01-16 MED ORDER — SODIUM CHLORIDE 0.9 % IV SOLN
750.0000 mg | Freq: Once | INTRAVENOUS | Status: AC
  Administered 2015-01-16: 750 mg via INTRAVENOUS
  Filled 2015-01-16: qty 75

## 2015-01-16 MED ORDER — HEPARIN SOD (PORK) LOCK FLUSH 100 UNIT/ML IV SOLN
500.0000 [IU] | Freq: Once | INTRAVENOUS | Status: AC | PRN
  Administered 2015-01-16: 500 [IU]
  Filled 2015-01-16: qty 5

## 2015-01-16 MED ORDER — PACLITAXEL CHEMO INJECTION 300 MG/50ML
175.0000 mg/m2 | Freq: Once | INTRAVENOUS | Status: AC
  Administered 2015-01-16: 438 mg via INTRAVENOUS
  Filled 2015-01-16: qty 73

## 2015-01-16 MED ORDER — SODIUM CHLORIDE 0.9 % IV SOLN: Freq: Once | INTRAVENOUS | Status: DC

## 2015-01-16 MED ORDER — DIPHENHYDRAMINE HCL 50 MG/ML IJ SOLN
INTRAMUSCULAR | Status: AC
  Filled 2015-01-16: qty 1

## 2015-01-16 MED ORDER — LORAZEPAM 2 MG/ML IJ SOLN
INTRAMUSCULAR | Status: AC
  Filled 2015-01-16: qty 1

## 2015-01-16 MED ORDER — SODIUM CHLORIDE 0.9 % IJ SOLN
10.0000 mL | INTRAMUSCULAR | Status: DC | PRN
  Administered 2015-01-16: 10 mL
  Filled 2015-01-16: qty 10

## 2015-01-16 MED ORDER — DIPHENHYDRAMINE HCL 50 MG/ML IJ SOLN
50.0000 mg | Freq: Once | INTRAMUSCULAR | Status: AC
  Administered 2015-01-16: 50 mg via INTRAVENOUS

## 2015-01-16 NOTE — Patient Instructions (Signed)
[facility] Discharge Instructions for Patients Receiving Chemotherapy  Today you received the following chemotherapy agents: Taxol and Carboplatin.  To help prevent nausea and vomiting after your treatment, we encourage you to take your nausea medication: Zofran. Take one twice daily for 3 days. Begin the morning of 9/10. Take Compazine every 6 hours as needed. You may also take Lorazepam every 6 hours. (This will make you drowsy.)   If you develop nausea and vomiting that is not controlled by your nausea medication, call the clinic.   BELOW ARE SYMPTOMS THAT SHOULD BE REPORTED IMMEDIATELY:  *FEVER GREATER THAN 100.5 F  *CHILLS WITH OR WITHOUT FEVER  NAUSEA AND VOMITING THAT IS NOT CONTROLLED WITH YOUR NAUSEA MEDICATION  *UNUSUAL SHORTNESS OF BREATH  *UNUSUAL BRUISING OR BLEEDING  TENDERNESS IN MOUTH AND THROAT WITH OR WITHOUT PRESENCE OF ULCERS  *URINARY PROBLEMS  *BOWEL PROBLEMS  UNUSUAL RASH Items with * indicate a potential emergency and should be followed up as soon as possible.  Feel free to call the clinic should you have any questions or concerns. The clinic phone number is [phone].  Please show the [place] at check-in to the Emergency Department and triage nurse.

## 2015-01-21 ENCOUNTER — Encounter (HOSPITAL_COMMUNITY): Payer: Self-pay

## 2015-01-21 ENCOUNTER — Other Ambulatory Visit: Payer: Self-pay | Admitting: Oncology

## 2015-01-21 DIAGNOSIS — C563 Malignant neoplasm of bilateral ovaries: Secondary | ICD-10-CM

## 2015-01-21 DIAGNOSIS — C561 Malignant neoplasm of right ovary: Secondary | ICD-10-CM

## 2015-01-21 DIAGNOSIS — C562 Malignant neoplasm of left ovary: Principal | ICD-10-CM

## 2015-01-22 ENCOUNTER — Other Ambulatory Visit (HOSPITAL_BASED_OUTPATIENT_CLINIC_OR_DEPARTMENT_OTHER): Payer: BLUE CROSS/BLUE SHIELD

## 2015-01-22 ENCOUNTER — Encounter: Payer: Self-pay | Admitting: Oncology

## 2015-01-22 ENCOUNTER — Ambulatory Visit (HOSPITAL_BASED_OUTPATIENT_CLINIC_OR_DEPARTMENT_OTHER): Payer: BLUE CROSS/BLUE SHIELD

## 2015-01-22 ENCOUNTER — Ambulatory Visit (HOSPITAL_BASED_OUTPATIENT_CLINIC_OR_DEPARTMENT_OTHER): Payer: BLUE CROSS/BLUE SHIELD | Admitting: Oncology

## 2015-01-22 ENCOUNTER — Telehealth: Payer: Self-pay | Admitting: Oncology

## 2015-01-22 VITALS — BP 112/82 | HR 93 | Temp 98.9°F | Resp 18 | Ht 68.0 in | Wt 280.5 lb

## 2015-01-22 DIAGNOSIS — C563 Malignant neoplasm of bilateral ovaries: Secondary | ICD-10-CM

## 2015-01-22 DIAGNOSIS — K219 Gastro-esophageal reflux disease without esophagitis: Secondary | ICD-10-CM | POA: Diagnosis not present

## 2015-01-22 DIAGNOSIS — R1013 Epigastric pain: Secondary | ICD-10-CM

## 2015-01-22 DIAGNOSIS — Z95828 Presence of other vascular implants and grafts: Secondary | ICD-10-CM

## 2015-01-22 DIAGNOSIS — G622 Polyneuropathy due to other toxic agents: Secondary | ICD-10-CM

## 2015-01-22 DIAGNOSIS — G620 Drug-induced polyneuropathy: Secondary | ICD-10-CM

## 2015-01-22 DIAGNOSIS — Z1379 Encounter for other screening for genetic and chromosomal anomalies: Secondary | ICD-10-CM

## 2015-01-22 DIAGNOSIS — C562 Malignant neoplasm of left ovary: Secondary | ICD-10-CM

## 2015-01-22 DIAGNOSIS — C561 Malignant neoplasm of right ovary: Secondary | ICD-10-CM

## 2015-01-22 DIAGNOSIS — T451X5A Adverse effect of antineoplastic and immunosuppressive drugs, initial encounter: Secondary | ICD-10-CM

## 2015-01-22 DIAGNOSIS — E8940 Asymptomatic postprocedural ovarian failure: Secondary | ICD-10-CM

## 2015-01-22 LAB — CBC WITH DIFFERENTIAL/PLATELET
BASO%: 0.2 % (ref 0.0–2.0)
BASOS ABS: 0 10*3/uL (ref 0.0–0.1)
EOS%: 3 % (ref 0.0–7.0)
Eosinophils Absolute: 0.2 10*3/uL (ref 0.0–0.5)
HCT: 37.1 % (ref 34.8–46.6)
HGB: 12.5 g/dL (ref 11.6–15.9)
LYMPH%: 34.3 % (ref 14.0–49.7)
MCH: 29.1 pg (ref 25.1–34.0)
MCHC: 33.8 g/dL (ref 31.5–36.0)
MCV: 86.2 fL (ref 79.5–101.0)
MONO#: 0.1 10*3/uL (ref 0.1–0.9)
MONO%: 1.4 % (ref 0.0–14.0)
NEUT#: 3.9 10*3/uL (ref 1.5–6.5)
NEUT%: 61.1 % (ref 38.4–76.8)
Platelets: 219 10*3/uL (ref 145–400)
RBC: 4.31 10*6/uL (ref 3.70–5.45)
RDW: 14.2 % (ref 11.2–14.5)
WBC: 6.4 10*3/uL (ref 3.9–10.3)
lymph#: 2.2 10*3/uL (ref 0.9–3.3)

## 2015-01-22 LAB — COMPREHENSIVE METABOLIC PANEL (CC13)
ALBUMIN: 3.8 g/dL (ref 3.5–5.0)
ALK PHOS: 52 U/L (ref 40–150)
ALT: 45 U/L (ref 0–55)
ANION GAP: 7 meq/L (ref 3–11)
AST: 30 U/L (ref 5–34)
BUN: 12.9 mg/dL (ref 7.0–26.0)
CALCIUM: 9.8 mg/dL (ref 8.4–10.4)
CHLORIDE: 102 meq/L (ref 98–109)
CO2: 29 mEq/L (ref 22–29)
CREATININE: 0.8 mg/dL (ref 0.6–1.1)
EGFR: 86 mL/min/{1.73_m2} — ABNORMAL LOW (ref >90)
Glucose: 118 mg/dl (ref 70–140)
POTASSIUM: 3.9 meq/L (ref 3.5–5.1)
Sodium: 138 mEq/L (ref 136–145)
Total Bilirubin: 0.84 mg/dL (ref 0.20–1.20)
Total Protein: 7 g/dL (ref 6.4–8.3)

## 2015-01-22 LAB — MORPHOLOGY: PLT EST: ADEQUATE

## 2015-01-22 MED ORDER — TEMAZEPAM 15 MG PO CAPS: 15.0000 mg | ORAL_CAPSULE | Freq: Every evening | ORAL | Status: DC | PRN

## 2015-01-22 MED ORDER — SUCRALFATE 1 G PO TABS: 1.0000 g | ORAL_TABLET | Freq: Three times a day (TID) | ORAL | Status: DC

## 2015-01-22 MED ORDER — HEPARIN SOD (PORK) LOCK FLUSH 100 UNIT/ML IV SOLN
500.0000 [IU] | Freq: Once | INTRAVENOUS | Status: AC
  Administered 2015-01-22: 500 [IU] via INTRAVENOUS
  Filled 2015-01-22: qty 5

## 2015-01-22 MED ORDER — SODIUM CHLORIDE 0.9 % IJ SOLN
10.0000 mL | INTRAMUSCULAR | Status: DC | PRN
  Administered 2015-01-22: 10 mL via INTRAVENOUS
  Filled 2015-01-22: qty 10

## 2015-01-22 MED ORDER — LORAZEPAM 1 MG PO TABS: ORAL_TABLET | ORAL | Status: DC

## 2015-01-22 MED ORDER — DEXAMETHASONE 4 MG PO TABS: ORAL_TABLET | ORAL | Status: DC

## 2015-01-22 MED ORDER — TRAMADOL HCL 50 MG PO TABS: 50.0000 mg | ORAL_TABLET | Freq: Four times a day (QID) | ORAL | Status: DC | PRN

## 2015-01-22 NOTE — Patient Instructions (Signed)

## 2015-01-22 NOTE — Progress Notes (Signed)
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 Cycle 3 was given on 01-16-15; she has not required gCSF support thus far. Note patient is being followed closely as she has had a difficult time with treatments.  NOTE her insurance refused preauthorization for granix up front, see phone note 11-28-14 with contact # for drug to be authorized during treatment if needed.   Patient has had less severe side effects from most recent chemotherapy than at start of treatment, however continues to have a difficult time tolerating fatigue, aches, sleep disturbance and hot flashes particularly. EMEND IV added with most recent treatment, after preauthorization obtained, was additionally helpful controlling nausea. Patient states that aching in abdomen and legs began night of chemo, at which time she took 2 mg dilaudid. She continued 2-4 mg dilaudid every 6 hours thru 01-21-15 (=day 6). She dislikes being groggy or sedated from dilaudid, requests "something less strong for aching now". She reports bowels moving regularly and she has been able to eat and to drink fluids. She reports sleeping easily "with all of the medicine" just after chemo, but has not been able to fall asleep or stay asleep when not using that. Carafate continues very helpful for GERD. She has been more fatigued so far this cycle, day 7 today; note she had 10 hour car ride to and from [place] just prior to most recent treatment. No increased SOB or chest pain. No swelling LE. No bleeding. No fever or symptoms of infection. No abdominal or pelvic symptoms otherwise. Voiding ok. Neuropathy in toes > fingers, most bothersome when she wears shoes, better when feet not constricted as with sandals or socks only.    PAC placed  by IR 12-04-14 Genetics testing sent 12-17-14 and pending Pre op CA [id number]  FSELTRVUY HISTORY Patient had history of endometriosis and infertility, with no abnormal PAP smears but no recent gyn exams. She was in usual health until a few weeks of intermittent low grade nausea, then <24 hours of progressively more severe right upper quadrant abdominal pain. She presented to [facility] ED with the abdominal pain on 11-09-14, with abdominal US not remarkable, then CT AP demonstrated bilateral solid and cystic ovarian masses in total measuring 14 x 10 x 11 cm. She was transferred to [facility] Dr Thurston Pounds, with preoperative CA 125 of 554. On 11-10-14 she had exploratory laparotomy with supracervical hysterectomy, BSO, infracolic omentectomy, bilateral pelvic lymphadenectomy and aortic node sampling. 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 . Surgical pathology from [facility] [phone]) from 11-10-14 had grade 3 clear cell carcinoma in 6 cm right ovary and 11 cm left ovary with capsules intact, bilateral fallopian tubes not involved, uterus and omentum not involved, no LVSI, for stage IB. No malignant cells in ascites or peritoneal washings. Her postoperative course was uncomplicated. [facility] multidisciplinary conference recommended 6 cycles of adjuvant taxol carboplatin. She will have 6 week post op visit with Dr Thurston Pounds. First carbo taxol was given 6-83-72, complicated by taxol reaction despite full premedication, and severe taxol aches.    Review of systems as above, also:  No problems with PAC. No complaints about scalp today.  Remainder of 10 point Review of Systems negative.  Objective:  Vital signs in last 24 hours:  BP 112/82 mmHg   Pulse 93  Temp(Src) 98.9 F (37.2 C) (Oral)  Resp 18  Ht 5' 8" (1.727 m)  Wt 280 lb 8 oz (127.234 kg)  BMI 42.66 kg/m2  LMP 11/02/2014 Weight down 8 lbs Alert, oriented and talkative, not in any acute distress tho tearful at start of visit. Ambulatory without assistance.  Alopecia  HEENT:PERRL, sclerae not icteric. Oral mucosa moist without lesions, posterior pharynx clear.  Neck supple. No JVD.  Lymphatics:no supraclavicular adenopathy Resp: clear to auscultation bilaterally and normal percussion bilaterally Cardio: regular rate and rhythm. No gallop. GI: abdomen obese, soft, nontender, not distended,cannot appreciate mass or organomegaly. Normally active bowel sounds. Surgical incision closed. Musculoskeletal/ Extremities: without pitting edema, cords, tenderness Neuro: mild peripheral neuropathy as noted. Otherwise nonfocal. Psych as above, no longer crying, affect and mood seem improved by completion of visit Skin without rash, ecchymosis, petechiae Portacath-without erythema or tenderness  Lab Results:  Results for orders placed or performed in visit on 01/22/15  CBC with Differential  Result Value Ref Range   WBC 6.4 3.9 - 10.3 10e3/uL   NEUT# 3.9 1.5 - 6.5 10e3/uL   HGB 12.5 11.6 - 15.9 g/dL   HCT 37.1 34.8 - 46.6 %   Platelets 219 145 - 400 10e3/uL   MCV 86.2 79.5 - 101.0 fL   MCH 29.1 25.1 - 34.0 pg   MCHC 33.8 31.5 - 36.0 g/dL   RBC 4.31 3.70 - 5.45 10e6/uL   RDW 14.2 11.2 - 14.5 %   lymph# 2.2 0.9 - 3.3 10e3/uL   MONO# 0.1 0.1 - 0.9 10e3/uL   Eosinophils Absolute 0.2 0.0 - 0.5 10e3/uL   Basophils Absolute 0.0 0.0 - 0.1 10e3/uL   NEUT% 61.1 38.4 - 76.8 %   LYMPH% 34.3 14.0 - 49.7 %   MONO% 1.4 0.0 - 14.0 %   EOS% 3.0 0.0 - 7.0 %   BASO% 0.2 0.0 - 2.0 %  Morphology  Result Value Ref Range   Tear Drop Cells occ Negative   Ovalocytes occ Negative   White Cell Comments C/W auto diff    PLT EST Adequate Adequate  Comprehensive metabolic panel (Cmet) - [facility]   Result Value Ref Range   Sodium 138 136 - 145 mEq/L   Potassium 3.9 3.5 - 5.1 mEq/L   Chloride 102 98 - 109 mEq/L   CO2 29 22 - 29 mEq/L   Glucose 118 70 - 140 mg/dl   BUN 12.9 7.0 - 26.0 mg/dL   Creatinine 0.8 0.6 - 1.1 mg/dL   Total Bilirubin 0.84 0.20 - 1.20 mg/dL   Alkaline Phosphatase 52 40 - 150 U/L   AST 30 5 - 34 U/L   ALT 45 0 - 55 U/L   Total Protein 7.0 6.4 - 8.3 g/dL   Albumin 3.8 3.5 - 5.0 g/dL   Calcium 9.8 8.4 - 10.4 mg/dL   Anion Gap 7 3 - 11 mEq/L   EGFR 86 (L) >90 ml/min/1.73 m2   Morphology review shows no immature cells on CBC  CA 125 on 12-25-14 was 44, will repeat with labs on 01-29-15  Studies/Results:  No results found.  Medications: I have reviewed the patient's current medications. Will try tramadol 50 mg q 8 hrs prn taxol aches instead of dilaudid when possible - note patient has tolerated  tramadol for other indications in past. Will try temazepam (Restoril) 15 mg at hs prn sleep when she is not taking other sedating medications, ie not during the several days after each chemo treatment. Continue Carafate. She has sufficient dilaudid available, does not need this refilled. Ativan refilled prn nausea. Continue full decadron premedication for taxol (note possible infusion reaction cycle 1).  DISCUSSION: Patient expressed to RN and to this MD that she does not feel she is "connecting" with this MD. We have had lengthy discussion about her concerns, the side effects of chemo, medications, goal of eradicating this cancer if possible.  We have discussed hot flashes related to premature surgical menopause and peripheral neuropathy from taxane. .I have told her that it really is not possible to avoid all side effects from chemotherapy, tho certainly we are glad to continue to try to address these for her as best possible.  I have mentioned support groups and support staff availability, and she does seem open to talking with support staff. I have requested L.Lundeen be in  contact with her - thank you!   Patient aware that she can be in touch with this office at any time if concerns prior to next scheduled visit.   Prescription written for wig at patient's request, as she has found items that she likes on [address]  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 Plan 6 cycles of q 3 week taxol carboplatin. Cycle 3 given 01-16-15 with addition of EMEND. Has not needed gCSF thus far. I will see her again on 9-22, due cycle 4 on 02-06-15. 2. Epigastric tenderness: much improved with addition of carafate, which she will continue. The GERD may have worsened chemo nausea additionally. EMEND helpful with nausea, continue. 3.PAC in 4. Scalp folliculitis with hair loss from chemo, with slight left cervical lymphadenopathy likely related. Completed Keflex x 7 days. 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 Already on gabapentin. Patient in agreement with continuing chemo now. 6.surgical menopause: hot flashes still bothersome at times despite celexa and gabapentin, but gyn oncologist prefers no estrogen. Discussed environmental aspects. 7. Taxol aches: will try tramadol as alternative to dilaudid; if not helpful then should not continue taking pain medication. Night of chemo is sooner than generally seen with taxol aches, and continued symptoms out over a week may be more related to some arthritis or lack of activity rather than strictly the taxol. 8.hypothyroidism: recently diagnosed, due follow up with PCP since beginning synthroid 9.morbid obesity, BMI 44. Had begun to lose weight biking prior to this diagnosis, but has not been able to continue biking with chemotherapy 10.hx migraines, no problem with zofran  11.anaphylaxis to avelox, rash to sulfa 12.possible slight  lymphedema LE related to pelvic surgery. May need referral to lymphedema PT   13.environmental allergies controlled with prn claritin D 14.fatigue with chemo and other, including sleep problems and hot flashes. Discussed. 71.IWPYKDX complicating other symptoms: appreciate [place] support staff assisting 16. Degenerative arthritis right knee  All questions answered to the best of my ability. Time spent 35 min including >50% counseling and coordination of care.  Cc Dr Lucianne Lei Aloha Gell, MD   01/22/2015, 4:08 PM

## 2015-01-22 NOTE — Telephone Encounter (Signed)
Gave avs & calendar for September/October. °

## 2015-01-23 ENCOUNTER — Telehealth: Payer: Self-pay

## 2015-01-23 NOTE — Telephone Encounter (Signed)
Told [name] the results of her chemistries as noted below by Dr. Marko Plume.  Pt appreciated the phone call.

## 2015-01-23 NOTE — Telephone Encounter (Signed)
-----   Message from Gordy Levan, MD sent at 01/23/2015  7:29 AM EDT ----- Labs seen and need follow up: please let her know chemistries look good - I don't think we had these back at time of visit yesterday   Thank you

## 2015-01-28 ENCOUNTER — Other Ambulatory Visit: Payer: Self-pay | Admitting: Oncology

## 2015-01-28 DIAGNOSIS — C563 Malignant neoplasm of bilateral ovaries: Secondary | ICD-10-CM

## 2015-01-28 DIAGNOSIS — C561 Malignant neoplasm of right ovary: Secondary | ICD-10-CM

## 2015-01-28 DIAGNOSIS — C562 Malignant neoplasm of left ovary: Principal | ICD-10-CM

## 2015-01-29 ENCOUNTER — Ambulatory Visit (HOSPITAL_BASED_OUTPATIENT_CLINIC_OR_DEPARTMENT_OTHER): Payer: BLUE CROSS/BLUE SHIELD | Admitting: Oncology

## 2015-01-29 ENCOUNTER — Ambulatory Visit (HOSPITAL_BASED_OUTPATIENT_CLINIC_OR_DEPARTMENT_OTHER): Payer: BLUE CROSS/BLUE SHIELD

## 2015-01-29 ENCOUNTER — Encounter: Payer: Self-pay | Admitting: Oncology

## 2015-01-29 ENCOUNTER — Other Ambulatory Visit: Payer: Self-pay | Admitting: Oncology

## 2015-01-29 ENCOUNTER — Other Ambulatory Visit (HOSPITAL_BASED_OUTPATIENT_CLINIC_OR_DEPARTMENT_OTHER): Payer: BLUE CROSS/BLUE SHIELD

## 2015-01-29 ENCOUNTER — Telehealth: Payer: Self-pay | Admitting: Oncology

## 2015-01-29 VITALS — BP 139/90 | HR 86 | Temp 98.2°F | Resp 18 | Ht 68.0 in | Wt 286.4 lb

## 2015-01-29 DIAGNOSIS — R300 Dysuria: Secondary | ICD-10-CM | POA: Diagnosis not present

## 2015-01-29 DIAGNOSIS — C562 Malignant neoplasm of left ovary: Secondary | ICD-10-CM | POA: Diagnosis not present

## 2015-01-29 DIAGNOSIS — C561 Malignant neoplasm of right ovary: Secondary | ICD-10-CM | POA: Diagnosis not present

## 2015-01-29 DIAGNOSIS — D701 Agranulocytosis secondary to cancer chemotherapy: Secondary | ICD-10-CM | POA: Diagnosis not present

## 2015-01-29 DIAGNOSIS — G622 Polyneuropathy due to other toxic agents: Secondary | ICD-10-CM

## 2015-01-29 DIAGNOSIS — C563 Malignant neoplasm of bilateral ovaries: Secondary | ICD-10-CM

## 2015-01-29 DIAGNOSIS — K219 Gastro-esophageal reflux disease without esophagitis: Secondary | ICD-10-CM

## 2015-01-29 DIAGNOSIS — Z95828 Presence of other vascular implants and grafts: Secondary | ICD-10-CM

## 2015-01-29 DIAGNOSIS — G620 Drug-induced polyneuropathy: Secondary | ICD-10-CM

## 2015-01-29 DIAGNOSIS — T451X5A Adverse effect of antineoplastic and immunosuppressive drugs, initial encounter: Secondary | ICD-10-CM

## 2015-01-29 DIAGNOSIS — Z9889 Other specified postprocedural states: Secondary | ICD-10-CM

## 2015-01-29 LAB — URINALYSIS, MICROSCOPIC - CHCC
BILIRUBIN (URINE): NEGATIVE
BLOOD: NEGATIVE
Glucose: NEGATIVE mg/dL
KETONES: NEGATIVE mg/dL
Leukocyte Esterase: NEGATIVE
Nitrite: NEGATIVE
PH: 5 (ref 4.6–8.0)
Protein: NEGATIVE mg/dL
RBC / HPF: NEGATIVE (ref 0–2)
Specific Gravity, Urine: 1.01 (ref 1.003–1.035)
Urobilinogen, UR: 0.2 mg/dL (ref 0.2–1)
WBC, UA: NEGATIVE (ref 0–2)

## 2015-01-29 LAB — CBC WITH DIFFERENTIAL/PLATELET
BASO%: 0.4 % (ref 0.0–2.0)
BASOS ABS: 0 10*3/uL (ref 0.0–0.1)
EOS%: 4.8 % (ref 0.0–7.0)
Eosinophils Absolute: 0.1 10*3/uL (ref 0.0–0.5)
HEMATOCRIT: 35.8 % (ref 34.8–46.6)
HGB: 12.1 g/dL (ref 11.6–15.9)
LYMPH#: 1.4 10*3/uL (ref 0.9–3.3)
LYMPH%: 53 % — ABNORMAL HIGH (ref 14.0–49.7)
MCH: 29.2 pg (ref 25.1–34.0)
MCHC: 33.8 g/dL (ref 31.5–36.0)
MCV: 86.4 fL (ref 79.5–101.0)
MONO#: 0.5 10*3/uL (ref 0.1–0.9)
MONO%: 17.8 % — ABNORMAL HIGH (ref 0.0–14.0)
NEUT#: 0.6 10*3/uL — ABNORMAL LOW (ref 1.5–6.5)
NEUT%: 24 % — AB (ref 38.4–76.8)
PLATELETS: 333 10*3/uL (ref 145–400)
RBC: 4.14 10*6/uL (ref 3.70–5.45)
RDW: 14.9 % — ABNORMAL HIGH (ref 11.2–14.5)
WBC: 2.7 10*3/uL — ABNORMAL LOW (ref 3.9–10.3)

## 2015-01-29 MED ORDER — HEPARIN SOD (PORK) LOCK FLUSH 100 UNIT/ML IV SOLN
500.0000 [IU] | Freq: Once | INTRAVENOUS | Status: AC
  Administered 2015-01-29: 500 [IU] via INTRAVENOUS
  Filled 2015-01-29: qty 5

## 2015-01-29 MED ORDER — TBO-FILGRASTIM 480 MCG/0.8ML ~~LOC~~ SOSY
480.0000 ug | PREFILLED_SYRINGE | Freq: Once | SUBCUTANEOUS | Status: AC
  Administered 2015-01-29: 480 ug via SUBCUTANEOUS
  Filled 2015-01-29: qty 0.8

## 2015-01-29 MED ORDER — SODIUM CHLORIDE 0.9 % IJ SOLN
10.0000 mL | INTRAMUSCULAR | Status: DC | PRN
  Administered 2015-01-29: 10 mL via INTRAVENOUS
  Filled 2015-01-29: qty 10

## 2015-01-29 NOTE — Telephone Encounter (Signed)
Appointments made and avs printed for pateint °

## 2015-01-29 NOTE — Patient Instructions (Signed)

## 2015-01-29 NOTE — Progress Notes (Signed)
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 Cycle 3 was given on 01-16-15 and she is neutropenic for first time today, but not febrile.  Patient has felt somewhat better this week tho still fatigued; she did 5K walk "slowly" over weekend. She has had some minimal burning on urination for past 24 hrs, and with neutropenia we will check urine studies. She has no other symptoms of infection. Aches are essentially resolved; she still notices soreness in skin lower legs. Appetite is better with improvement in nausea. GERD continues, tho overall improved with daily protonix and qid carafate, bothersome last pm. Bowels are moving only every other day and I have asked her to add colace +/- Senokot to keep them moving daily. Scalp is no longer painful, folliculitis essentially resolved, no tender adenopathy now in left neck. Insect bite leg resolved. Still did not sleep well with Restoril 15 mg x 2 nights, tho better than without it so will increase to 30 mg at hs. Taxol neuropathy minimal tips of fingers, more noticeable soles of feet   PAC placed by IR 12-04-14 Genetics testing sent 12-17-14 normal (OvaNext) Pre op CA 125 [place] staff aware and patient glad to talk with Lorrin Jackson.  ONCOLOGIC HISTORY Patient had history of endometriosis and infertility, with no abnormal PAP smears but no recent gyn exams. She was in usual health until a few weeks of intermittent low grade nausea, then <24 hours of progressively more severe right upper quadrant abdominal pain. She presented to [facility] ED with the abdominal pain on 11-09-14, with abdominal US not remarkable, then CT AP demonstrated bilateral solid and cystic ovarian masses in total measuring 14 x 10 x 11 cm. She was  transferred to [facility] Dr Thurston Pounds, with preoperative CA 125 of 554. On 11-10-14 she had exploratory laparotomy with supracervical hysterectomy, BSO, infracolic omentectomy, bilateral pelvic lymphadenectomy and aortic node sampling. 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 . Surgical pathology from [facility] [phone]) from 11-10-14 had grade 3 clear cell carcinoma in 6 cm right ovary and 11 cm left ovary with capsules intact, bilateral fallopian tubes not involved, uterus and omentum not involved, no LVSI, for stage IB. No malignant cells in ascites or peritoneal washings. Her postoperative course was uncomplicated. [facility] multidisciplinary conference recommended 6 cycles of adjuvant taxol carboplatin. She will have 6 week post op visit with Dr Thurston Pounds. First carbo taxol was given 12-25-27, complicated by taxol reaction despite full premedication, and severe taxol aches. She was neutropenic on day 14 cycle 3 with ANC 0.6. OvaNext gene panel normal, sent 12-17-14.   Review of systems as above, also: No problems with PAC. No bleeding. No increased SOB. No noticeable LE swelling.  Remainder of 10 point Review of Systems negative.    Objective:  Vital signs in last 24 hours:  BP 139/90 mmHg  Pulse 86  Temp(Src) 98.2 F (36.8 C) (Oral)  Resp 18  Ht 5\' 8"  (1.727 m)  Wt 286 lb 6.4 oz (129.91 kg)  BMI 43.56 kg/m2  SpO2 99%  LMP 11/02/2014 Weight up 6  lbs Alert, oriented and appropriate. Ambulatory without difficulty.  Partial alopecia  HEENT:PERRL, sclerae not icteric. Oral mucosa moist without lesions, posterior pharynx clear.  Neck supple. No JVD. Scalp with nearly resolved scattered excoriated areas from previous  folliculitis.  Lymphatics:no cervical nodes including left neck, no supraclavicular, axillary or inguinal adenopathy Resp: clear to auscultation bilaterally and normal percussion bilaterally Cardio: regular rate and rhythm. No gallop. GI: abdomen obese, soft, nontender, not distended, no mass or organomegaly. Normally active bowel sounds. Surgical incision closed, decreased sensation lower aspect of incision, still smooth thickening not as discreet just to left of lower end of scar.  Musculoskeletal/ Extremities: without pitting edema, cords, tenderness Neuro:  peripheral neuropathy as noted. Otherwise nonfocal Skin otherwise without rash, ecchymosis, petechiae Portacath-without erythema or tenderness  Lab Results:  Results for orders placed or performed in visit on 01/29/15  CBC with Differential  Result Value Ref Range   WBC 2.7 (L) 3.9 - 10.3 10e3/uL   NEUT# 0.6 (L) 1.5 - 6.5 10e3/uL   HGB 12.1 11.6 - 15.9 g/dL   HCT 35.8 34.8 - 46.6 %   Platelets 333 145 - 400 10e3/uL   MCV 86.4 79.5 - 101.0 fL   MCH 29.2 25.1 - 34.0 pg   MCHC 33.8 31.5 - 36.0 g/dL   RBC 4.14 3.70 - 5.45 10e6/uL   RDW 14.9 (H) 11.2 - 14.5 %   lymph# 1.4 0.9 - 3.3 10e3/uL   MONO# 0.5 0.1 - 0.9 10e3/uL   Eosinophils Absolute 0.1 0.0 - 0.5 10e3/uL   Basophils Absolute 0.0 0.0 - 0.1 10e3/uL   NEUT% 24.0 (L) 38.4 - 76.8 %   LYMPH% 53.0 (H) 14.0 - 49.7 %   MONO% 17.8 (H) 0.0 - 14.0 %   EOS% 4.8 0.0 - 7.0 %   BASO% 0.4 0.0 - 2.0 %  Urinalysis with microscopic - [facility]  Result Value Ref Range   Glucose Negative Negative mg/dL   Bilirubin (Urine) Negative Negative   Ketones Negative Negative mg/dL   Specific Gravity, Urine 1.010 1.003 - 1.035   Blood Negative Negative   pH 5.0 4.6 - 8.0   Protein Negative Negative- <30 mg/dL   Urobilinogen, UR 0.2 0.2 - 1 mg/dL   Nitrite Negative Negative   Leukocyte Esterase Negative Negative   RBC / HPF Negative 0 - 2   WBC, UA Negative 0 - 2   Epithelial Cells Occasional  Negative- Few    CA 125 to be repeated with labs 02-06-15  Studies/Results:  No results found.  Medications: I have reviewed the patient's current medications. Granix 480 mcg given today and will be repeated on 9-23 if ANC <1.0 then. Claritin today and daily for next few days due to granix  DISCUSSION As patient's insurance would not preauthorize gCSF as initially requested at start of treatment (including peer to peer conversation then, see note 11-28-14), this MD spent 25.5  min on phone during office today, in addition to visit time, including speaking with insurance nurse and insurance MD in another peer to peer, with approval obtained at completion of all of that. Patient was informed x2 of reason for MD delay in seeing her during that lengthy phone call.  Authorization # [id number] thru 05-14-2015 for days 5-10 if needed.  [place] managed care notified.  We have discussed chemo neutropenia, neutropenic precautions, infection concerns while neutropenic, rationale for gCSF now and for using this also after upcoming chemotherapy treatments, mechanism of action of gCSF and possible aches in  active marrow areas which can include spine, sternum, pelvis. She is to begin claritin today. We will recheck CBC on 9-23, no further gCSF this cycle if ANC >=1.0 then. We have discussed timing of gCSF with subsequent cycles from standpoint of her severe taxol aches.  She will have labs done on 9-29 to be sure adequate for cycle 4 carbo taxol on 02-06-15, ok to treat if ANC >=1.5 and plt >=100k and if renal function and bilirubin ok. Check CA 125 with labs that day. I will see her 10-6 and expect to begin granix that day.  Granix in preference to neulasta due to severity of aches.   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 Plan 6 cycles of q 3 week taxol carboplatin. Cycle 3 given 01-16-15 with addition of EMEND. Has not needed gCSF thus far. Confirm labs  adequate on 9-29 for cycle 4 on 02-06-15.I will see her with labs day 7, expect to add  2. Chemo neutropenia: granix today 480 mcg after authorization (finally) obtained from insurance; she will have this again on 9-23 if [place] <1.0.  She understands that she may have aches from gCSF.  Neutropenic precautions. 3.PAC in 4. 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 Already on gabapentin. Patient in agreement with continuing chemo now. 6.surgical menopause: hot flashes still bothersome at times despite celexa and gabapentin, but gyn oncologist prefers no estrogen. Discussed environmental aspects. 7. Taxol aches: will try tramadol as alternative to dilaudid; if not helpful then should not continue taking pain medication.  8.hypothyroidism: recently diagnosed, due follow up with PCP since beginning synthroid 9.morbid obesity, BMI 44. Had begun to lose weight biking prior to this diagnosis, but has not been able to continue biking with chemotherapy 10.hx migraines, no problem with zofran  11.anaphylaxis to avelox, rash to sulfa. Tolerated keflex without difficulty 12.possible slight lymphedema LE related to pelvic surgery. May need referral to lymphedema PT  13.environmental allergies controlled with prn claritin D 14.fatigue with chemo, neutropenia, sleep problems. Discussed. Try higher dose Restoril 15.anxiety complicating other symptoms: appreciate [place] support staff availability 16.Epigastric tenderness: improved with addition of carafate tho not completely resolved.  The GERD may have worsened chemo nausea additionally. EMEND helpful with nausea, continue. PCP may need to follow up; I do not see H.pylori testing 17.. Degenerative arthritis right  knee   All questions answered. Time spent 40 min including >50% coordination of care and discussion. Patient understands and is in agreement with recommendations and plans as above. Granix orders with parameters placed. Continuing to keep Dr Thurston Pounds updated.    Gordy Levan, MD   01/29/2015, 5:22 PM

## 2015-01-29 NOTE — Patient Instructions (Signed)

## 2015-01-30 ENCOUNTER — Telehealth: Payer: Self-pay | Admitting: Oncology

## 2015-01-30 ENCOUNTER — Ambulatory Visit: Payer: BLUE CROSS/BLUE SHIELD

## 2015-01-30 ENCOUNTER — Ambulatory Visit (HOSPITAL_BASED_OUTPATIENT_CLINIC_OR_DEPARTMENT_OTHER): Payer: BLUE CROSS/BLUE SHIELD

## 2015-01-30 ENCOUNTER — Other Ambulatory Visit: Payer: Self-pay | Admitting: Oncology

## 2015-01-30 ENCOUNTER — Telehealth: Payer: Self-pay

## 2015-01-30 ENCOUNTER — Other Ambulatory Visit: Payer: BLUE CROSS/BLUE SHIELD

## 2015-01-30 ENCOUNTER — Other Ambulatory Visit (HOSPITAL_BASED_OUTPATIENT_CLINIC_OR_DEPARTMENT_OTHER): Payer: BLUE CROSS/BLUE SHIELD

## 2015-01-30 DIAGNOSIS — Z95828 Presence of other vascular implants and grafts: Secondary | ICD-10-CM

## 2015-01-30 DIAGNOSIS — C561 Malignant neoplasm of right ovary: Secondary | ICD-10-CM

## 2015-01-30 DIAGNOSIS — C562 Malignant neoplasm of left ovary: Secondary | ICD-10-CM | POA: Diagnosis not present

## 2015-01-30 DIAGNOSIS — T451X5A Adverse effect of antineoplastic and immunosuppressive drugs, initial encounter: Secondary | ICD-10-CM

## 2015-01-30 DIAGNOSIS — C563 Malignant neoplasm of bilateral ovaries: Secondary | ICD-10-CM

## 2015-01-30 DIAGNOSIS — D701 Agranulocytosis secondary to cancer chemotherapy: Secondary | ICD-10-CM

## 2015-01-30 LAB — CBC WITH DIFFERENTIAL/PLATELET
BASO%: 0.5 % (ref 0.0–2.0)
BASOS ABS: 0 10*3/uL (ref 0.0–0.1)
EOS ABS: 0.2 10*3/uL (ref 0.0–0.5)
EOS%: 2.7 % (ref 0.0–7.0)
HCT: 35.4 % (ref 34.8–46.6)
HEMOGLOBIN: 12 g/dL (ref 11.6–15.9)
LYMPH%: 28 % (ref 14.0–49.7)
MCH: 29.4 pg (ref 25.1–34.0)
MCHC: 33.8 g/dL (ref 31.5–36.0)
MCV: 86.8 fL (ref 79.5–101.0)
MONO#: 1.1 10*3/uL — AB (ref 0.1–0.9)
MONO%: 15.6 % — ABNORMAL HIGH (ref 0.0–14.0)
NEUT%: 53.2 % (ref 38.4–76.8)
NEUTROS ABS: 3.6 10*3/uL (ref 1.5–6.5)
PLATELETS: 315 10*3/uL (ref 145–400)
RBC: 4.08 10*6/uL (ref 3.70–5.45)
RDW: 15.4 % — AB (ref 11.2–14.5)
WBC: 6.8 10*3/uL (ref 3.9–10.3)
lymph#: 1.9 10*3/uL (ref 0.9–3.3)

## 2015-01-30 LAB — URINE CULTURE

## 2015-01-30 MED ORDER — SODIUM CHLORIDE 0.9 % IJ SOLN
10.0000 mL | INTRAMUSCULAR | Status: DC | PRN
  Administered 2015-01-30: 10 mL via INTRAVENOUS
  Filled 2015-01-30: qty 10

## 2015-01-30 MED ORDER — TBO-FILGRASTIM 480 MCG/0.8ML ~~LOC~~ SOSY: 480.0000 ug | PREFILLED_SYRINGE | Freq: Once | SUBCUTANEOUS | Status: DC

## 2015-01-30 MED ORDER — HEPARIN SOD (PORK) LOCK FLUSH 100 UNIT/ML IV SOLN
500.0000 [IU] | Freq: Once | INTRAVENOUS | Status: AC
  Administered 2015-01-30: 500 [IU] via INTRAVENOUS
  Filled 2015-01-30: qty 5

## 2015-01-30 NOTE — Progress Notes (Signed)
ANC 3.6 today doesn't need Granix today.

## 2015-01-30 NOTE — Telephone Encounter (Signed)
Told her the results of U/A as noted below by Dr. Marko Plume.  Will let her know the results of the urine culture when available.

## 2015-01-30 NOTE — Telephone Encounter (Signed)
-----   Message from Gordy Levan, MD sent at 01/29/2015  5:36 PM EDT ----- Please let her know UA looked ok on 9-22. Need to f/u cx until final (minimal symptoms but sent due to neutropenia) She is for CBC possible granix on 9-23  thanks

## 2015-01-30 NOTE — Patient Instructions (Signed)

## 2015-01-30 NOTE — Telephone Encounter (Signed)
Called and left a message per pof to make lab and flush 9/29 not 9/30

## 2015-02-02 ENCOUNTER — Encounter: Payer: Self-pay | Admitting: Oncology

## 2015-02-02 ENCOUNTER — Telehealth: Payer: Self-pay | Admitting: *Deleted

## 2015-02-02 NOTE — Telephone Encounter (Signed)
Notified of message below

## 2015-02-02 NOTE — Progress Notes (Signed)
Per [employer]  granix was approved 01/29/15-05/14/15  Approved [id number] I will send to medical records

## 2015-02-02 NOTE — Telephone Encounter (Signed)
-----   Message from Gordy Levan, MD sent at 01/30/2015  4:41 PM EDT ----- Labs seen and need follow up: please let her know no UTI, and we're glad her ANC was back up in good range today!

## 2015-02-03 ENCOUNTER — Encounter: Payer: Self-pay | Admitting: General Practice

## 2015-02-03 NOTE — Progress Notes (Signed)
Spiritual Care Note  Returned call, reaching [name] by phone.  We plan to meet on Friday, October 7 for emotional support in coping with dx/tx.  She was very pleased to make contact and verbalized appreciation.  [place], [place] Pager [phone] Voicemail  [phone]

## 2015-02-04 ENCOUNTER — Ambulatory Visit (HOSPITAL_BASED_OUTPATIENT_CLINIC_OR_DEPARTMENT_OTHER): Payer: BLUE CROSS/BLUE SHIELD

## 2015-02-04 DIAGNOSIS — C561 Malignant neoplasm of right ovary: Secondary | ICD-10-CM

## 2015-02-04 DIAGNOSIS — Z95828 Presence of other vascular implants and grafts: Secondary | ICD-10-CM

## 2015-02-04 DIAGNOSIS — C562 Malignant neoplasm of left ovary: Secondary | ICD-10-CM | POA: Diagnosis not present

## 2015-02-04 DIAGNOSIS — C563 Malignant neoplasm of bilateral ovaries: Secondary | ICD-10-CM

## 2015-02-04 LAB — CBC WITH DIFFERENTIAL/PLATELET
BASO%: 0.4 % (ref 0.0–2.0)
BASOS ABS: 0 10*3/uL (ref 0.0–0.1)
EOS ABS: 0.1 10*3/uL (ref 0.0–0.5)
EOS%: 1.1 % (ref 0.0–7.0)
HCT: 37.5 % (ref 34.8–46.6)
HEMOGLOBIN: 12.8 g/dL (ref 11.6–15.9)
LYMPH%: 25.5 % (ref 14.0–49.7)
MCH: 29.8 pg (ref 25.1–34.0)
MCHC: 34.1 g/dL (ref 31.5–36.0)
MCV: 87.2 fL (ref 79.5–101.0)
MONO#: 0.8 10*3/uL (ref 0.1–0.9)
MONO%: 8.5 % (ref 0.0–14.0)
NEUT%: 64.5 % (ref 38.4–76.8)
NEUTROS ABS: 6.1 10*3/uL (ref 1.5–6.5)
PLATELETS: 320 10*3/uL (ref 145–400)
RBC: 4.3 10*6/uL (ref 3.70–5.45)
RDW: 14.4 % (ref 11.2–14.5)
WBC: 9.5 10*3/uL (ref 3.9–10.3)
lymph#: 2.4 10*3/uL (ref 0.9–3.3)

## 2015-02-04 LAB — COMPREHENSIVE METABOLIC PANEL (CC13)
ALT: 48 U/L (ref 0–55)
ANION GAP: 8 meq/L (ref 3–11)
AST: 28 U/L (ref 5–34)
Albumin: 3.7 g/dL (ref 3.5–5.0)
Alkaline Phosphatase: 57 U/L (ref 40–150)
BUN: 7.2 mg/dL (ref 7.0–26.0)
CALCIUM: 9.4 mg/dL (ref 8.4–10.4)
CHLORIDE: 107 meq/L (ref 98–109)
CO2: 25 mEq/L (ref 22–29)
CREATININE: 0.8 mg/dL (ref 0.6–1.1)
EGFR: 90 mL/min/{1.73_m2} — ABNORMAL LOW (ref >90)
Glucose: 106 mg/dl (ref 70–140)
POTASSIUM: 3.6 meq/L (ref 3.5–5.1)
Sodium: 140 mEq/L (ref 136–145)
Total Bilirubin: 0.57 mg/dL (ref 0.20–1.20)
Total Protein: 7 g/dL (ref 6.4–8.3)

## 2015-02-04 MED ORDER — HEPARIN SOD (PORK) LOCK FLUSH 100 UNIT/ML IV SOLN
500.0000 [IU] | Freq: Once | INTRAVENOUS | Status: AC
  Administered 2015-02-04: 500 [IU] via INTRAVENOUS
  Filled 2015-02-04: qty 5

## 2015-02-04 MED ORDER — SODIUM CHLORIDE 0.9 % IJ SOLN
10.0000 mL | INTRAMUSCULAR | Status: DC | PRN
  Administered 2015-02-04: 10 mL via INTRAVENOUS
  Filled 2015-02-04: qty 10

## 2015-02-04 NOTE — Patient Instructions (Signed)

## 2015-02-05 ENCOUNTER — Telehealth: Payer: Self-pay | Admitting: *Deleted

## 2015-02-05 ENCOUNTER — Other Ambulatory Visit: Payer: BLUE CROSS/BLUE SHIELD

## 2015-02-05 LAB — CA 125: CA 125: 38 U/mL — ABNORMAL HIGH (ref <35)

## 2015-02-05 NOTE — Telephone Encounter (Signed)
Pt notified of labs. OK for treatment 9/30. To take steroids as directed

## 2015-02-06 ENCOUNTER — Ambulatory Visit: Payer: BLUE CROSS/BLUE SHIELD

## 2015-02-06 ENCOUNTER — Other Ambulatory Visit: Payer: BLUE CROSS/BLUE SHIELD

## 2015-02-06 VITALS — BP 128/84 | HR 100 | Temp 98.0°F | Resp 17

## 2015-02-06 DIAGNOSIS — C561 Malignant neoplasm of right ovary: Secondary | ICD-10-CM

## 2015-02-06 DIAGNOSIS — C563 Malignant neoplasm of bilateral ovaries: Secondary | ICD-10-CM

## 2015-02-06 DIAGNOSIS — C562 Malignant neoplasm of left ovary: Principal | ICD-10-CM

## 2015-02-06 MED ORDER — LORAZEPAM 2 MG/ML IJ SOLN
1.0000 mg | Freq: Once | INTRAMUSCULAR | Status: AC
  Administered 2015-02-06: 1 mg via INTRAVENOUS

## 2015-02-06 MED ORDER — DIPHENHYDRAMINE HCL 50 MG/ML IJ SOLN
50.0000 mg | Freq: Once | INTRAMUSCULAR | Status: AC
  Administered 2015-02-06: 50 mg via INTRAVENOUS

## 2015-02-06 MED ORDER — DIPHENHYDRAMINE HCL 50 MG/ML IJ SOLN
INTRAMUSCULAR | Status: AC
  Filled 2015-02-06: qty 1

## 2015-02-06 MED ORDER — SODIUM CHLORIDE 0.9 % IJ SOLN
10.0000 mL | INTRAMUSCULAR | Status: DC | PRN
  Administered 2015-02-06: 10 mL
  Filled 2015-02-06: qty 10

## 2015-02-06 MED ORDER — SODIUM CHLORIDE 0.9 % IV SOLN
750.0000 mg | Freq: Once | INTRAVENOUS | Status: AC
  Administered 2015-02-06: 750 mg via INTRAVENOUS
  Filled 2015-02-06: qty 75

## 2015-02-06 MED ORDER — FAMOTIDINE IN NACL 20-0.9 MG/50ML-% IV SOLN
20.0000 mg | Freq: Once | INTRAVENOUS | Status: AC
  Administered 2015-02-06: 20 mg via INTRAVENOUS

## 2015-02-06 MED ORDER — SODIUM CHLORIDE 0.9 % IV SOLN
Freq: Once | INTRAVENOUS | Status: AC
  Administered 2015-02-06: 11:00:00 via INTRAVENOUS
  Filled 2015-02-06: qty 8

## 2015-02-06 MED ORDER — HEPARIN SOD (PORK) LOCK FLUSH 100 UNIT/ML IV SOLN
500.0000 [IU] | Freq: Once | INTRAVENOUS | Status: AC | PRN
  Administered 2015-02-06: 500 [IU]
  Filled 2015-02-06: qty 5

## 2015-02-06 MED ORDER — LORAZEPAM 2 MG/ML IJ SOLN
INTRAMUSCULAR | Status: AC
  Filled 2015-02-06: qty 1

## 2015-02-06 MED ORDER — SODIUM CHLORIDE 0.9 % IV SOLN
Freq: Once | INTRAVENOUS | Status: AC
  Administered 2015-02-06: 10:00:00 via INTRAVENOUS

## 2015-02-06 MED ORDER — SODIUM CHLORIDE 0.9 % IV SOLN
Freq: Once | INTRAVENOUS | Status: AC
  Administered 2015-02-06: 11:00:00 via INTRAVENOUS
  Filled 2015-02-06: qty 5

## 2015-02-06 MED ORDER — FAMOTIDINE IN NACL 20-0.9 MG/50ML-% IV SOLN
INTRAVENOUS | Status: AC
  Filled 2015-02-06: qty 50

## 2015-02-06 MED ORDER — PACLITAXEL CHEMO INJECTION 300 MG/50ML
175.0000 mg/m2 | Freq: Once | INTRAVENOUS | Status: AC
  Administered 2015-02-06: 438 mg via INTRAVENOUS
  Filled 2015-02-06: qty 73

## 2015-02-06 NOTE — Progress Notes (Signed)
Pt c/o restless legs after benadryl administration. Called Tammi RN, Dr. Mariana Kaufman nurse. Received order for additional dose of 1mg  of ativan. 20 minutes later patient states that symptoms have decreased.

## 2015-02-06 NOTE — Patient Instructions (Signed)
[facility] Discharge Instructions for Patients Receiving Chemotherapy  Today you received the following chemotherapy agents taxol, carboplatin  To help prevent nausea and vomiting after your treatment, we encourage you to take your nausea medication   If you develop nausea and vomiting that is not controlled by your nausea medication, call the clinic.   BELOW ARE SYMPTOMS THAT SHOULD BE REPORTED IMMEDIATELY:  *FEVER GREATER THAN 100.5 F  *CHILLS WITH OR WITHOUT FEVER  NAUSEA AND VOMITING THAT IS NOT CONTROLLED WITH YOUR NAUSEA MEDICATION  *UNUSUAL SHORTNESS OF BREATH  *UNUSUAL BRUISING OR BLEEDING  TENDERNESS IN MOUTH AND THROAT WITH OR WITHOUT PRESENCE OF ULCERS  *URINARY PROBLEMS  *BOWEL PROBLEMS  UNUSUAL RASH Items with * indicate a potential emergency and should be followed up as soon as possible.  Feel free to call the clinic you have any questions or concerns. The clinic phone number is [phone].  Please show the CHEMO ALERT CARD at check-in to the Emergency Department and triage nurse.   

## 2015-02-11 ENCOUNTER — Other Ambulatory Visit: Payer: Self-pay

## 2015-02-11 ENCOUNTER — Other Ambulatory Visit: Payer: Self-pay | Admitting: Oncology

## 2015-02-11 DIAGNOSIS — C563 Malignant neoplasm of bilateral ovaries: Secondary | ICD-10-CM

## 2015-02-11 DIAGNOSIS — C561 Malignant neoplasm of right ovary: Secondary | ICD-10-CM

## 2015-02-11 DIAGNOSIS — C562 Malignant neoplasm of left ovary: Principal | ICD-10-CM

## 2015-02-12 ENCOUNTER — Telehealth: Payer: Self-pay | Admitting: Oncology

## 2015-02-12 ENCOUNTER — Ambulatory Visit (HOSPITAL_BASED_OUTPATIENT_CLINIC_OR_DEPARTMENT_OTHER): Payer: BLUE CROSS/BLUE SHIELD | Admitting: Oncology

## 2015-02-12 ENCOUNTER — Encounter: Payer: Self-pay | Admitting: Oncology

## 2015-02-12 ENCOUNTER — Other Ambulatory Visit (HOSPITAL_BASED_OUTPATIENT_CLINIC_OR_DEPARTMENT_OTHER): Payer: BLUE CROSS/BLUE SHIELD

## 2015-02-12 VITALS — BP 128/92 | HR 93 | Temp 97.3°F | Resp 18 | Ht 68.0 in | Wt 280.6 lb

## 2015-02-12 DIAGNOSIS — E860 Dehydration: Secondary | ICD-10-CM

## 2015-02-12 DIAGNOSIS — R112 Nausea with vomiting, unspecified: Secondary | ICD-10-CM

## 2015-02-12 DIAGNOSIS — R110 Nausea: Secondary | ICD-10-CM

## 2015-02-12 DIAGNOSIS — R10816 Epigastric abdominal tenderness: Secondary | ICD-10-CM

## 2015-02-12 DIAGNOSIS — C563 Malignant neoplasm of bilateral ovaries: Secondary | ICD-10-CM

## 2015-02-12 DIAGNOSIS — T451X5A Adverse effect of antineoplastic and immunosuppressive drugs, initial encounter: Secondary | ICD-10-CM

## 2015-02-12 DIAGNOSIS — C562 Malignant neoplasm of left ovary: Secondary | ICD-10-CM

## 2015-02-12 DIAGNOSIS — M179 Osteoarthritis of knee, unspecified: Secondary | ICD-10-CM

## 2015-02-12 DIAGNOSIS — C561 Malignant neoplasm of right ovary: Secondary | ICD-10-CM

## 2015-02-12 DIAGNOSIS — D701 Agranulocytosis secondary to cancer chemotherapy: Secondary | ICD-10-CM

## 2015-02-12 DIAGNOSIS — K219 Gastro-esophageal reflux disease without esophagitis: Secondary | ICD-10-CM

## 2015-02-12 DIAGNOSIS — G620 Drug-induced polyneuropathy: Secondary | ICD-10-CM

## 2015-02-12 DIAGNOSIS — E039 Hypothyroidism, unspecified: Secondary | ICD-10-CM

## 2015-02-12 LAB — COMPREHENSIVE METABOLIC PANEL (CC13)
ALT: 55 U/L (ref 0–55)
ANION GAP: 9 meq/L (ref 3–11)
AST: 37 U/L — ABNORMAL HIGH (ref 5–34)
Albumin: 3.7 g/dL (ref 3.5–5.0)
Alkaline Phosphatase: 52 U/L (ref 40–150)
BUN: 13.3 mg/dL (ref 7.0–26.0)
CO2: 26 meq/L (ref 22–29)
Calcium: 9.5 mg/dL (ref 8.4–10.4)
Chloride: 103 mEq/L (ref 98–109)
Creatinine: 0.8 mg/dL (ref 0.6–1.1)
GLUCOSE: 128 mg/dL (ref 70–140)
POTASSIUM: 3.8 meq/L (ref 3.5–5.1)
SODIUM: 138 meq/L (ref 136–145)
Total Bilirubin: 1.07 mg/dL (ref 0.20–1.20)
Total Protein: 6.8 g/dL (ref 6.4–8.3)

## 2015-02-12 LAB — CBC WITH DIFFERENTIAL/PLATELET
BASO%: 0.2 % (ref 0.0–2.0)
BASOS ABS: 0 10*3/uL (ref 0.0–0.1)
EOS ABS: 0.1 10*3/uL (ref 0.0–0.5)
EOS%: 1.4 % (ref 0.0–7.0)
HCT: 35.8 % (ref 34.8–46.6)
HGB: 12.4 g/dL (ref 11.6–15.9)
LYMPH%: 35.3 % (ref 14.0–49.7)
MCH: 29.5 pg (ref 25.1–34.0)
MCHC: 34.6 g/dL (ref 31.5–36.0)
MCV: 85.2 fL (ref 79.5–101.0)
MONO#: 0 10*3/uL — ABNORMAL LOW (ref 0.1–0.9)
MONO%: 1 % (ref 0.0–14.0)
NEUT#: 2.6 10*3/uL (ref 1.5–6.5)
NEUT%: 62.1 % (ref 38.4–76.8)
Platelets: 193 10*3/uL (ref 145–400)
RBC: 4.2 10*6/uL (ref 3.70–5.45)
RDW: 14.4 % (ref 11.2–14.5)
WBC: 4.1 10*3/uL (ref 3.9–10.3)
lymph#: 1.5 10*3/uL (ref 0.9–3.3)

## 2015-02-12 MED ORDER — TBO-FILGRASTIM 480 MCG/0.8ML ~~LOC~~ SOSY
480.0000 ug | PREFILLED_SYRINGE | Freq: Once | SUBCUTANEOUS | Status: AC
  Administered 2015-02-12: 480 ug via SUBCUTANEOUS
  Filled 2015-02-12: qty 0.8

## 2015-02-12 NOTE — Telephone Encounter (Signed)
Chemo added and avs printed for pateint

## 2015-02-12 NOTE — Progress Notes (Signed)
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 She had cycle 4 carbo taxol on 02-06-15 and will begin granix today due to neutropenia documented day 14 cycle 3.   Patient again has had a difficult time with side effects since most recent chemotherapy, particularly taxol aches, constipation and increased neuropathy in toes. She uses dilauded for at least a week for taxol aches, likely exacerbating the constipation. She has had 2 bowel movements in past week, but has used miralax only x3; we have discussed using this daily in addition to colace 1-2x daily. She complains of "squeezing" discomfort in first 3 toes bilaterally, presently on neurontin 300 mg tid which does not make her drowsy; ok to increase this to 600 mg AM and PM with 300 mg at midday. She has slept well this week with all of medication. She is keeping down fluids tho taste not good and not drinking as much as usual. She feels lightheaded and unsteady at times when gets up.     PAC placed by IR 12-04-14 Genetics testing sent 12-17-14 normal (OvaNext) Pre op CA 125 554    ONCOLOGIC HISTORY Patient had history of endometriosis and infertility, with no abnormal PAP smears but no recent gyn exams. She was in usual health until a few weeks of intermittent low grade nausea, then <24 hours of progressively more severe right upper quadrant abdominal pain. She presented to [facility] ED with the abdominal pain on 11-09-14, with abdominal US not remarkable, then CT AP demonstrated bilateral solid and cystic ovarian masses in total measuring 14 x 10 x 11 cm. She was transferred to [facility] Dr Thurston Pounds, with preoperative CA 125 of 554. On 11-10-14 she had exploratory laparotomy with supracervical hysterectomy, BSO, infracolic omentectomy,  bilateral pelvic lymphadenectomy and aortic node sampling. 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 . Surgical pathology from [facility] [phone]) from 11-10-14 had grade 3 clear cell carcinoma in 6 cm right ovary and 11 cm left ovary with capsules intact, bilateral fallopian tubes not involved, uterus and omentum not involved, no LVSI, for stage IB. No malignant cells in ascites or peritoneal washings. Her postoperative course was uncomplicated. [facility] multidisciplinary conference recommended 6 cycles of adjuvant taxol carboplatin. She will have 6 week post op visit with Dr Thurston Pounds. First carbo taxol was given 06-07-84, complicated by taxol reaction despite full premedication, and severe taxol aches. She was neutropenic on day 14 cycle 3 with ANC 0.6. OvaNext gene panel normal, sent 12-17-14.    Review of systems as above, also: No fever or symptoms of infection. No SOB with present activity. No problems with PAC. No significant neuropathy in fingers Remainder of 10 point Review of Systems negative.  Objective:  Vital signs in last 24 hours:  BP 128/92 mmHg  Pulse 93  Temp(Src) 97.3 F (36.3 C) (Oral)  Resp 18  Ht 5' 8" (1.727 m)  Wt 280 lb 9.6 oz (127.279 kg)  BMI 42.67 kg/m2  SpO2 100%  LMP 11/02/2014 Weight down 6 lbs.  Alert, oriented and a little tearful intially, but more relaxed than in past. Ambulatory  without assistance.  Total alopecia  HEENT:PERRL, sclerae not icteric. Oral mucosa a little dry without lesions, posterior pharynx clear.  Neck supple. No JVD.  Lymphatics:no cervical,supraclavicular or inguinal adenopathy Resp: clear to auscultation bilaterally and normal percussion  bilaterally Cardio: regular rate and rhythm. No gallop. GI: abdomen obese, soft, nontender, not distended, no appreciable mass or organomegaly. Few bowel sounds. Surgical incision unchanged Musculoskeletal/ Extremities: without pitting edema, cords, tenderness Neuro:  peripheral neuropathy first 3 toes as described. Otherwise nonfocal. PSYCH as above Skin without rash, ecchymosis, petechiae Portacath-without erythema or tenderness  Lab Results:  Results for orders placed or performed in visit on 02/12/15  CBC with Differential  Result Value Ref Range   WBC 4.1 3.9 - 10.3 10e3/uL   NEUT# 2.6 1.5 - 6.5 10e3/uL   HGB 12.4 11.6 - 15.9 g/dL   HCT 35.8 34.8 - 46.6 %   Platelets 193 145 - 400 10e3/uL   MCV 85.2 79.5 - 101.0 fL   MCH 29.5 25.1 - 34.0 pg   MCHC 34.6 31.5 - 36.0 g/dL   RBC 4.20 3.70 - 5.45 10e6/uL   RDW 14.4 11.2 - 14.5 %   lymph# 1.5 0.9 - 3.3 10e3/uL   MONO# 0.0 (L) 0.1 - 0.9 10e3/uL   Eosinophils Absolute 0.1 0.0 - 0.5 10e3/uL   Basophils Absolute 0.0 0.0 - 0.1 10e3/uL   NEUT% 62.1 38.4 - 76.8 %   LYMPH% 35.3 14.0 - 49.7 %   MONO% 1.0 0.0 - 14.0 %   EOS% 1.4 0.0 - 7.0 %   BASO% 0.2 0.0 - 2.0 %  Comprehensive metabolic panel (Cmet) - [facility]  Result Value Ref Range   Sodium 138 136 - 145 mEq/L   Potassium 3.8 3.5 - 5.1 mEq/L   Chloride 103 98 - 109 mEq/L   CO2 26 22 - 29 mEq/L   Glucose 128 70 - 140 mg/dl   BUN 13.3 7.0 - 26.0 mg/dL   Creatinine 0.8 0.6 - 1.1 mg/dL   Total Bilirubin 1.07 0.20 - 1.20 mg/dL   Alkaline Phosphatase 52 40 - 150 U/L   AST 37 (H) 5 - 34 U/L   ALT 55 0 - 55 U/L   Total Protein 6.8 6.4 - 8.3 g/dL   Albumin 3.7 3.5 - 5.0 g/dL   Calcium 9.5 8.4 - 10.4 mg/dL   Anion Gap 9 3 - 11 mEq/L   EGFR >90 >90 ml/min/1.73 m2   CA 125 from 02-04-15 down to 38, this having been 44 on 12-25-14  Studies/Results:  No results found.  Medications: I have reviewed the patient's current medications. OK to increase gabapentin to 600 mg-300 mg-600 mg tid.  Use miralax daily, use colace 1-2x daily. Least dilaudid necessary. Claritin. Continue EMEND with chemo, as this has lessened chemo nausea. Granix 480 mcg today and 10-7.  DISCUSSION: all of interval history reviewed in detail, medication changes as noted. Push po fluids. Begin miralax day before or day of chemo with next cycle.  WIll get flu vaccine after chemo completes, mid to late Nov. Will decide whether to decrease taxol dose depending on neuropathy symptoms next week.  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 Plan 6 cycles of q 3 week taxol carboplatin.GCSF added after neutropenia day 14 cycle 3. Continuing to support with other side effects as best possible, including EMEND, pain medication, bowel regimen, and chaplain to meet with her.  2. Chemo neutropenia with cycle 3:  granix today 480 mcg today and tomorrow 3.PAC in 4. 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 6.surgical menopause: hot flashes still bothersome at times despite celexa and gabapentin, gyn oncologist prefers no estrogen. . 7. Taxol aches: has needed dilaudid this week, hopefully to decrease to tramadol soon. She is very sensitive to this problem.  8.hypothyroidism: recently diagnosed, due follow up with PCP since beginning synthroid 9.morbid obesity, BMI 44. Had begun to lose weight biking prior to this diagnosis, but has not been able to continue biking with chemotherapy 10.hx migraines, no problem with zofran  11.anaphylaxis to avelox, rash to sulfa. Tolerated keflex without difficulty 12.possible slight lymphedema LE related to pelvic surgery. May need referral to lymphedema PT, but has not mentioned this again recently 13.environmental allergies controlled with prn claritin D 14.fatigue with chemo, neutropenia, sleep  problems.  01.UXNATFT complicating other symptoms: appreciate [facility] chaplain assisting 16.Epigastric tenderness: improved with addition of carafate tho not completely resolved. The GERD may have worsened chemo nausea additionally. EMEND helpful with nausea, continue. PCP may need to follow up; I do not see H.pylori testing 17.. Degenerative arthritis right knee   All questions answered and patient is in agreement with recommendations and plans. Time spent 30 min including >50% counseling and coordination of care. I will see her 02-26-15 prior to  Cycle 5 on 02-27-15.  Cc Dr Lucianne Lei Aloha Gell, MD   02/12/2015, 7:59 PM

## 2015-02-12 NOTE — Patient Instructions (Signed)
OK to increase gabapentin (Neurontin) to 2 of the 300 mg tablets in morning if needed for neuropathy and/or 2 tablets in evening if needed. Still take one of the tablets in midday.  Push fluids, as you are a little dehydrated  Continue colace once or twice daily each day (stool softener) Take miralax (glycolax) one capful of powder mixed in full glass of liquid daily as needed now to keep bowels moving well. Also take the miralax daily or twice daily starting day before or day of chemo and continue as long as you need to use the pain medicine.

## 2015-02-12 NOTE — Telephone Encounter (Signed)
Added appts per pof..the patient will get nov appts at [facility] visit

## 2015-02-13 ENCOUNTER — Encounter: Payer: Self-pay | Admitting: General Practice

## 2015-02-13 ENCOUNTER — Ambulatory Visit (HOSPITAL_BASED_OUTPATIENT_CLINIC_OR_DEPARTMENT_OTHER): Payer: BLUE CROSS/BLUE SHIELD

## 2015-02-13 VITALS — BP 133/77 | HR 100 | Temp 98.3°F | Resp 20

## 2015-02-13 DIAGNOSIS — C562 Malignant neoplasm of left ovary: Principal | ICD-10-CM

## 2015-02-13 DIAGNOSIS — D701 Agranulocytosis secondary to cancer chemotherapy: Secondary | ICD-10-CM | POA: Diagnosis not present

## 2015-02-13 DIAGNOSIS — C561 Malignant neoplasm of right ovary: Secondary | ICD-10-CM

## 2015-02-13 DIAGNOSIS — C563 Malignant neoplasm of bilateral ovaries: Secondary | ICD-10-CM

## 2015-02-13 MED ORDER — TBO-FILGRASTIM 480 MCG/0.8ML ~~LOC~~ SOSY
480.0000 ug | PREFILLED_SYRINGE | Freq: Once | SUBCUTANEOUS | Status: AC
  Administered 2015-02-13: 480 ug via SUBCUTANEOUS
  Filled 2015-02-13: qty 0.8

## 2015-02-13 NOTE — Patient Instructions (Signed)

## 2015-02-13 NOTE — Progress Notes (Signed)
Spiritual Care Note  Met with Levada Dy and SO Ross for ca 90 minutes, making space for them to share and process stressors and sources of meaning/coping.  Particular issues pt identified:  Having life focus almost exclusively on cancer since unexpected dx on 11/09/14; dealing with debilitating pain from chemo and shots; feeling isolated/stuck at home because of fatigue and pain; feeling guilty for depending on Harrington Challenger so much for support; and living with fear of recurrence.  She cites gyn support group and a recent walk for ovarian cancer as very meaningful ways to connect with other people who are navigating similar challenges.  She also named and explored the personal victory of completing a hike at [employer] shortly after chemo, even though she wanted to quit many times on the way up; this experience gave her a sense of agency and empowerment that counterbalanced some loss of control.  Provided reflective listening, normalization of feelings, affirmation of strengths, and suggestions for coping techniques.  Tools included activity for partners to thank each other for "filling each other's buckets" (naming gratitude in a way that may help release some guilt) and positivity/gratitude exercise based on neuroscience evidence Thurston Pounds, etc).  Encouraged [employer] activities for reflection, community-building, and encouragement of personal agency.  Offered further Spiritual Care and counseling resource for additional support.  Couple verbalized appreciation and left visibly relieved and empowered.    [place], [place], [facility] Pager [phone] Voicemail  [phone]

## 2015-02-24 ENCOUNTER — Other Ambulatory Visit: Payer: Self-pay | Admitting: Oncology

## 2015-02-26 ENCOUNTER — Telehealth: Payer: Self-pay | Admitting: Oncology

## 2015-02-26 ENCOUNTER — Other Ambulatory Visit: Payer: Self-pay

## 2015-02-26 ENCOUNTER — Encounter: Payer: Self-pay | Admitting: Oncology

## 2015-02-26 ENCOUNTER — Other Ambulatory Visit: Payer: BLUE CROSS/BLUE SHIELD

## 2015-02-26 ENCOUNTER — Ambulatory Visit (HOSPITAL_BASED_OUTPATIENT_CLINIC_OR_DEPARTMENT_OTHER): Payer: BLUE CROSS/BLUE SHIELD | Admitting: Oncology

## 2015-02-26 ENCOUNTER — Ambulatory Visit (HOSPITAL_BASED_OUTPATIENT_CLINIC_OR_DEPARTMENT_OTHER): Payer: BLUE CROSS/BLUE SHIELD

## 2015-02-26 VITALS — BP 129/90 | HR 94 | Temp 99.2°F | Resp 18 | Ht 68.0 in | Wt 285.7 lb

## 2015-02-26 DIAGNOSIS — C562 Malignant neoplasm of left ovary: Secondary | ICD-10-CM

## 2015-02-26 DIAGNOSIS — C561 Malignant neoplasm of right ovary: Secondary | ICD-10-CM

## 2015-02-26 DIAGNOSIS — G620 Drug-induced polyneuropathy: Secondary | ICD-10-CM

## 2015-02-26 DIAGNOSIS — K219 Gastro-esophageal reflux disease without esophagitis: Secondary | ICD-10-CM

## 2015-02-26 DIAGNOSIS — Z95828 Presence of other vascular implants and grafts: Secondary | ICD-10-CM

## 2015-02-26 DIAGNOSIS — E039 Hypothyroidism, unspecified: Secondary | ICD-10-CM

## 2015-02-26 DIAGNOSIS — E8940 Asymptomatic postprocedural ovarian failure: Secondary | ICD-10-CM

## 2015-02-26 DIAGNOSIS — Z452 Encounter for adjustment and management of vascular access device: Secondary | ICD-10-CM | POA: Diagnosis not present

## 2015-02-26 DIAGNOSIS — M1990 Unspecified osteoarthritis, unspecified site: Secondary | ICD-10-CM

## 2015-02-26 DIAGNOSIS — R112 Nausea with vomiting, unspecified: Secondary | ICD-10-CM

## 2015-02-26 DIAGNOSIS — F411 Generalized anxiety disorder: Secondary | ICD-10-CM

## 2015-02-26 DIAGNOSIS — C563 Malignant neoplasm of bilateral ovaries: Secondary | ICD-10-CM

## 2015-02-26 DIAGNOSIS — D701 Agranulocytosis secondary to cancer chemotherapy: Secondary | ICD-10-CM | POA: Diagnosis not present

## 2015-02-26 DIAGNOSIS — T451X5A Adverse effect of antineoplastic and immunosuppressive drugs, initial encounter: Secondary | ICD-10-CM

## 2015-02-26 DIAGNOSIS — Z6841 Body Mass Index (BMI) 40.0 and over, adult: Secondary | ICD-10-CM

## 2015-02-26 DIAGNOSIS — R10816 Epigastric abdominal tenderness: Secondary | ICD-10-CM

## 2015-02-26 DIAGNOSIS — R530 Neoplastic (malignant) related fatigue: Secondary | ICD-10-CM

## 2015-02-26 MED ORDER — ALTEPLASE 2 MG IJ SOLR
2.0000 mg | Freq: Once | INTRAMUSCULAR | Status: AC | PRN
  Administered 2015-02-26: 2 mg
  Filled 2015-02-26: qty 2

## 2015-02-26 MED ORDER — HEPARIN SOD (PORK) LOCK FLUSH 100 UNIT/ML IV SOLN
500.0000 [IU] | Freq: Once | INTRAVENOUS | Status: AC
  Administered 2015-02-26: 500 [IU] via INTRAVENOUS
  Filled 2015-02-26: qty 5

## 2015-02-26 MED ORDER — SODIUM CHLORIDE 0.9 % IJ SOLN
10.0000 mL | INTRAMUSCULAR | Status: DC | PRN
  Administered 2015-02-26: 10 mL via INTRAVENOUS
  Filled 2015-02-26: qty 10

## 2015-02-26 MED ORDER — HYDROMORPHONE HCL 4 MG PO TABS: ORAL_TABLET | ORAL | Status: DC

## 2015-02-26 MED ORDER — TRAMADOL HCL 50 MG PO TABS: 50.0000 mg | ORAL_TABLET | Freq: Four times a day (QID) | ORAL | Status: DC | PRN

## 2015-02-26 NOTE — Patient Instructions (Signed)

## 2015-02-26 NOTE — Progress Notes (Signed)
TPA instilled in flush room. No blood return after 1 3/4 hours. No interventional radiology appts available today for port check. Per Dr Marko Plume discharged pt home with needle insitu and TPA still indwelling. Pt has chemo appt tomorrow and infusion room notified of situation. New lab appt POF sent d/t need to reprint lab labels.

## 2015-02-26 NOTE — Progress Notes (Signed)
OFFICE PROGRESS NOTE   February 26, 2015   [place], [name], [facility]), S.Norris  INTERVAL HISTORY:   Patient is seen, alone for visit, in continuing attention to adjuvant chemotherapy in process with carboplatin taxol, due cycle 5 on 02-27-15. Counts have maintained with granix x2 beginning day 7, delay due to severity of taxol aches.   She has continued to be somewhat fatigued this week, however neuropathy in feet is some better, able to feel light touch with toes just tingling; she does not have significant neuropathy now in hands. Bowels are moving normally without medication this week and she has had no nausea. She has had some intermittent pain in groins bilaterally, not clearly associated with position or activity.   PAC did not draw today, including after short dwell TPA, so will try this overnight. She has had no discomfort at that site   [facility] placed by IR 12-04-14 Genetics testing sent 12-17-14 normal (OvaNext) Pre op CA 106 269 She will have flu vaccine after completing chemo in Nov.  She would like to return to work part time until chemo completes, letter written.  [facility] chaplain meeting seemed helpful.   ONCOLOGIC HISTORY Patient had history of endometriosis and infertility, with no abnormal PAP smears but no recent gyn exams. She was in usual health until a few weeks of intermittent low grade nausea, then <24 hours of progressively more severe right upper quadrant abdominal pain. She presented to [facility] ED with the abdominal pain on 11-09-14, with abdominal US not remarkable, then CT AP demonstrated bilateral solid and cystic ovarian masses in total measuring 14 x 10 x 11 cm. She was transferred to [facility] Dr Thurston Pounds, with preoperative CA 125 of 554. On 11-10-14 she had exploratory laparotomy with supracervical hysterectomy, BSO, infracolic omentectomy, bilateral pelvic lymphadenectomy and aortic node sampling. Surgical findings were significant for chocolate colored  ascites consistent with ruptured endometrioma, frozen pelvis consistent with stage IV endometriosis and bilateral retroperitoneal fibrosis requiring bilateral ureterolysis, sigmoid colon densely adherent to uterus and cerix, precluding cervical excision, normal abdominal survey. . Surgical pathology from [facility] [phone]) from 11-10-14 had grade 3 clear cell carcinoma in 6 cm right ovary and 11 cm left ovary with capsules intact, bilateral fallopian tubes not involved, uterus and omentum not involved, no LVSI, for stage IB. No malignant cells in ascites or peritoneal washings. Her postoperative course was uncomplicated. [facility] multidisciplinary conference recommended 6 cycles of adjuvant taxol carboplatin. She will have 6 week post op visit with Dr Thurston Pounds. First carbo taxol was given 0-09-38, complicated by taxol reaction despite full premedication, and severe taxol aches. She was neutropenic on day 14 cycle 3 with ANC 0.6. OvaNext gene panel normal, sent 12-17-14.     Review of systems as above, also: No fever or symptoms of infection. No SOB or cough. No bleeding. Bladder ok.  Remainder of 10 point Review of Systems negative.  Objective:  Vital signs in last 24 hours: Weight up 5 lbs to 285, BMI 43.5   129/90  94 regular  18 not labored RA   99.2 LMP 11/02/2014  Alert, oriented and appropriate. Ambulatory without assistance. Anxious about PAC but otherwise appears generally comfortable Alopecia  HEENT:PERRL, sclerae not icteric. Oral mucosa moist without lesions, posterior pharynx clear.  Neck supple. No JVD.  Lymphatics:no cervical,supraclavicular adenopathy Resp: clear to auscultation bilaterally and normal percussion bilaterally Cardio: regular rate and rhythm. No gallop. GI: abdomen obese, soft, nontender, not distended, no appreciable mass or organomegaly.  Normally active bowel sounds. Surgical incision healed, still deep smooth fullness at inferior end of scar to left, not tender and not  as prominent as just postoperatively Musculoskeletal/ Extremities: LE without pitting edema, cords, tenderness Neuro:  peripheral neuropathy mild to moderate in distal feet. Decreased sensation left thumb may not be associated. Otherwise nonfocal  PSYCH appropriate mood and affect Skin without rash, ecchymosis, petechiae Portacath-without erythema or tenderness, accessed with TPA dwell now  Lab Results:  Results for orders placed or performed in visit on 02/12/15  CBC with Differential  Result Value Ref Range   WBC 4.1 3.9 - 10.3 10e3/uL   NEUT# 2.6 1.5 - 6.5 10e3/uL   HGB 12.4 11.6 - 15.9 g/dL   HCT 35.8 34.8 - 46.6 %   Platelets 193 145 - 400 10e3/uL   MCV 85.2 79.5 - 101.0 fL   MCH 29.5 25.1 - 34.0 pg   MCHC 34.6 31.5 - 36.0 g/dL   RBC 4.20 3.70 - 5.45 10e6/uL   RDW 14.4 11.2 - 14.5 %   lymph# 1.5 0.9 - 3.3 10e3/uL   MONO# 0.0 (L) 0.1 - 0.9 10e3/uL   Eosinophils Absolute 0.1 0.0 - 0.5 10e3/uL   Basophils Absolute 0.0 0.0 - 0.1 10e3/uL   NEUT% 62.1 38.4 - 76.8 %   LYMPH% 35.3 14.0 - 49.7 %   MONO% 1.0 0.0 - 14.0 %   EOS% 1.4 0.0 - 7.0 %   BASO% 0.2 0.0 - 2.0 %  Comprehensive metabolic panel (Cmet) - [facility]  Result Value Ref Range   Sodium 138 136 - 145 mEq/L   Potassium 3.8 3.5 - 5.1 mEq/L   Chloride 103 98 - 109 mEq/L   CO2 26 22 - 29 mEq/L   Glucose 128 70 - 140 mg/dl   BUN 13.3 7.0 - 26.0 mg/dL   Creatinine 0.8 0.6 - 1.1 mg/dL   Total Bilirubin 1.07 0.20 - 1.20 mg/dL   Alkaline Phosphatase 52 40 - 150 U/L   AST 37 (H) 5 - 34 U/L   ALT 55 0 - 55 U/L   Total Protein 6.8 6.4 - 8.3 g/dL   Albumin 3.7 3.5 - 5.0 g/dL   Calcium 9.5 8.4 - 10.4 mg/dL   Anion Gap 9 3 - 11 mEq/L   EGFR >90 >90 ml/min/1.73 m2    CA 125 37, this having been 38 on 02-04-15  Studies/Results:  No results found.  Medications: I have reviewed the patient's current medications. Dilaudid and tramadol prescriptions now. She should not need additional dilauded for remainder of treatment. Temazepam  30 mg also not helpful for sleep so will not use that further.  DISCUSSION : patient in agreement with same dosing of taxol as previously, again in attempt to treat malignancy as best possible tho risk of increasing neuropathy. Will leave TPA to dwell overnight. Discussed plans for part time return to work, tho she will still be off for full week after chemo. Letter to be given to patient tomorrow.  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 Plan 6 cycles of q 3 week taxol carboplatin.GCSF added after neutropenia day 14 cycle 3. Continuing to support with other side effects as best possible, including EMEND, pain medication, bowel regimen. Appreciate support staff assistance. She will have cycle 5 on 02-27-15 same dosing, with granix 10-27 and 10-28. Plan repeat imaging and visit back to Dr Thurston Pounds after chemo completes. Note CA 125 not quite  to normal range. 2. Chemo neutropenia necessitating gCSF 3.PAC in: not drawing well today, TPA. 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 Follow 6.surgical menopause: hot flashes still bothersome at times despite celexa and gabapentin, gyn oncologist prefers no estrogen. . 7. Taxol aches: managed with dilaudid and tramadol. She is very sensitive to this problem.  8.hypothyroidism: recently diagnosed, due follow up with PCP since beginning synthroid 9.morbid obesity, BMI 45. Had begun to lose weight biking prior to this diagnosis, but has not been able to continue biking with chemotherapy 10.hx migraines, no problem with zofran  11.anaphylaxis to avelox, rash to sulfa. Tolerated keflex without difficulty 12.possible slight lymphedema LE related to pelvic surgery. May need referral to lymphedema PT,  but has not mentioned this again recently 13.environmental allergies controlled with prn claritin D 14.fatigue with chemo, neutropenia, sleep problems.  60.OKHTXHF complicating other symptoms: appreciate [facility] chaplain assisting 16.Epigastric tenderness: improved with addition of carafate. The GERD may have worsened chemo nausea additionally. EMEND helpful with nausea, continue. PCP may need to follow up; I do not see H.pylori testing 17.. Degenerative arthritis right knee 18.for flu vaccine after chemo  All questions answered, chemo and granix orders confirmed. Time spent 25 min including >50% counseling and coordination of care. She knows to call prior to next scheduled visit if needed. Cc Dr Jodie Echevaria, MD   02/26/2015, 9:52 AM

## 2015-02-26 NOTE — Telephone Encounter (Signed)
Called patient and she is aware of her added lab and flush

## 2015-02-26 NOTE — Progress Notes (Signed)
Pt PAC accessed scant amount of blood return noted. Pt PAC flushed with saline. NO blood return noted. Sara Reel, RN flushed pt PAC, reaccessed PAC still unable to obtain enough blood for lab draws.  Sara Reel, RN instilled TPA into [facility] at 0935. Pt sent on to appt with DR Marko Plume.

## 2015-02-27 ENCOUNTER — Other Ambulatory Visit (HOSPITAL_BASED_OUTPATIENT_CLINIC_OR_DEPARTMENT_OTHER): Payer: BLUE CROSS/BLUE SHIELD

## 2015-02-27 ENCOUNTER — Ambulatory Visit: Payer: BLUE CROSS/BLUE SHIELD

## 2015-02-27 ENCOUNTER — Ambulatory Visit (HOSPITAL_BASED_OUTPATIENT_CLINIC_OR_DEPARTMENT_OTHER): Payer: BLUE CROSS/BLUE SHIELD

## 2015-02-27 VITALS — BP 137/68 | HR 102 | Temp 97.9°F | Resp 20

## 2015-02-27 DIAGNOSIS — C561 Malignant neoplasm of right ovary: Secondary | ICD-10-CM

## 2015-02-27 DIAGNOSIS — C563 Malignant neoplasm of bilateral ovaries: Secondary | ICD-10-CM

## 2015-02-27 DIAGNOSIS — Z5111 Encounter for antineoplastic chemotherapy: Secondary | ICD-10-CM

## 2015-02-27 DIAGNOSIS — C562 Malignant neoplasm of left ovary: Secondary | ICD-10-CM | POA: Diagnosis not present

## 2015-02-27 LAB — CBC WITH DIFFERENTIAL/PLATELET
BASO%: 1.2 % (ref 0.0–2.0)
BASOS ABS: 0.1 10*3/uL (ref 0.0–0.1)
EOS ABS: 0 10*3/uL (ref 0.0–0.5)
EOS%: 0 % (ref 0.0–7.0)
HEMATOCRIT: 38.7 % (ref 34.8–46.6)
HGB: 13.1 g/dL (ref 11.6–15.9)
LYMPH#: 0.8 10*3/uL — AB (ref 0.9–3.3)
LYMPH%: 7.3 % — AB (ref 14.0–49.7)
MCH: 29.6 pg (ref 25.1–34.0)
MCHC: 33.8 g/dL (ref 31.5–36.0)
MCV: 87.5 fL (ref 79.5–101.0)
MONO#: 0 10*3/uL — AB (ref 0.1–0.9)
MONO%: 0.3 % (ref 0.0–14.0)
NEUT#: 10.5 10*3/uL — ABNORMAL HIGH (ref 1.5–6.5)
NEUT%: 91.2 % — AB (ref 38.4–76.8)
PLATELETS: 304 10*3/uL (ref 145–400)
RBC: 4.42 10*6/uL (ref 3.70–5.45)
RDW: 17.4 % — ABNORMAL HIGH (ref 11.2–14.5)
WBC: 11.5 10*3/uL — ABNORMAL HIGH (ref 3.9–10.3)

## 2015-02-27 LAB — COMPREHENSIVE METABOLIC PANEL (CC13)
ALT: 26 U/L (ref 0–55)
ANION GAP: 12 meq/L — AB (ref 3–11)
AST: 15 U/L (ref 5–34)
Albumin: 3.9 g/dL (ref 3.5–5.0)
Alkaline Phosphatase: 69 U/L (ref 40–150)
BUN: 12.5 mg/dL (ref 7.0–26.0)
CALCIUM: 10 mg/dL (ref 8.4–10.4)
CHLORIDE: 106 meq/L (ref 98–109)
CO2: 20 mEq/L — ABNORMAL LOW (ref 22–29)
CREATININE: 0.8 mg/dL (ref 0.6–1.1)
EGFR: 86 mL/min/{1.73_m2} — AB (ref >90)
Glucose: 256 mg/dl — ABNORMAL HIGH (ref 70–140)
Potassium: 3.9 mEq/L (ref 3.5–5.1)
Sodium: 138 mEq/L (ref 136–145)
Total Bilirubin: 0.72 mg/dL (ref 0.20–1.20)
Total Protein: 7.6 g/dL (ref 6.4–8.3)

## 2015-02-27 MED ORDER — SODIUM CHLORIDE 0.9 % IV SOLN
Freq: Once | INTRAVENOUS | Status: AC
  Administered 2015-02-27: 10:00:00 via INTRAVENOUS

## 2015-02-27 MED ORDER — SODIUM CHLORIDE 0.9 % IJ SOLN
10.0000 mL | INTRAMUSCULAR | Status: DC | PRN
  Administered 2015-02-27: 10 mL
  Filled 2015-02-27: qty 10

## 2015-02-27 MED ORDER — DIPHENHYDRAMINE HCL 50 MG/ML IJ SOLN
INTRAMUSCULAR | Status: AC
  Filled 2015-02-27: qty 1

## 2015-02-27 MED ORDER — LORAZEPAM 2 MG/ML IJ SOLN
INTRAMUSCULAR | Status: AC
  Filled 2015-02-27: qty 1

## 2015-02-27 MED ORDER — SODIUM CHLORIDE 0.9 % IV SOLN
Freq: Once | INTRAVENOUS | Status: AC
  Administered 2015-02-27: 10:00:00 via INTRAVENOUS
  Filled 2015-02-27: qty 5

## 2015-02-27 MED ORDER — SODIUM CHLORIDE 0.9 % IJ SOLN
10.0000 mL | INTRAMUSCULAR | Status: AC | PRN
  Administered 2015-02-27: 10 mL
  Filled 2015-02-27: qty 10

## 2015-02-27 MED ORDER — PACLITAXEL CHEMO INJECTION 300 MG/50ML
175.0000 mg/m2 | Freq: Once | INTRAVENOUS | Status: AC
  Administered 2015-02-27: 438 mg via INTRAVENOUS
  Filled 2015-02-27: qty 73

## 2015-02-27 MED ORDER — HEPARIN SOD (PORK) LOCK FLUSH 100 UNIT/ML IV SOLN
500.0000 [IU] | Freq: Once | INTRAVENOUS | Status: AC | PRN
  Administered 2015-02-27: 500 [IU]
  Filled 2015-02-27: qty 5

## 2015-02-27 MED ORDER — FAMOTIDINE IN NACL 20-0.9 MG/50ML-% IV SOLN
INTRAVENOUS | Status: AC
Start: 2015-02-27 — End: 2015-02-27
  Filled 2015-02-27: qty 50

## 2015-02-27 MED ORDER — SODIUM CHLORIDE 0.9 % IV SOLN
750.0000 mg | Freq: Once | INTRAVENOUS | Status: AC
  Administered 2015-02-27: 750 mg via INTRAVENOUS
  Filled 2015-02-27: qty 75

## 2015-02-27 MED ORDER — LORAZEPAM 2 MG/ML IJ SOLN
1.0000 mg | Freq: Once | INTRAMUSCULAR | Status: DC | PRN
  Administered 2015-02-27: 0.5 mg via INTRAVENOUS

## 2015-02-27 MED ORDER — LORAZEPAM 2 MG/ML IJ SOLN
0.5000 mg | Freq: Once | INTRAMUSCULAR | Status: AC
  Administered 2015-02-27: 0.5 mg via INTRAVENOUS

## 2015-02-27 MED ORDER — SODIUM CHLORIDE 0.9 % IV SOLN
Freq: Once | INTRAVENOUS | Status: AC
  Administered 2015-02-27: 10:00:00 via INTRAVENOUS
  Filled 2015-02-27: qty 8

## 2015-02-27 MED ORDER — DIPHENHYDRAMINE HCL 50 MG/ML IJ SOLN
50.0000 mg | Freq: Once | INTRAMUSCULAR | Status: AC
  Administered 2015-02-27: 50 mg via INTRAVENOUS

## 2015-02-27 MED ORDER — FAMOTIDINE IN NACL 20-0.9 MG/50ML-% IV SOLN
20.0000 mg | Freq: Once | INTRAVENOUS | Status: AC
  Administered 2015-02-27: 20 mg via INTRAVENOUS

## 2015-02-27 NOTE — Patient Instructions (Signed)
[facility] Discharge Instructions for Patients Receiving Chemotherapy  Today you received the following chemotherapy agents Taxol/Carbo  To help prevent nausea and vomiting after your treatment, we encourage you to take your nausea medication    If you develop nausea and vomiting that is not controlled by your nausea medication, call the clinic.   BELOW ARE SYMPTOMS THAT SHOULD BE REPORTED IMMEDIATELY:  *FEVER GREATER THAN 100.5 F  *CHILLS WITH OR WITHOUT FEVER  NAUSEA AND VOMITING THAT IS NOT CONTROLLED WITH YOUR NAUSEA MEDICATION  *UNUSUAL SHORTNESS OF BREATH  *UNUSUAL BRUISING OR BLEEDING  TENDERNESS IN MOUTH AND THROAT WITH OR WITHOUT PRESENCE OF ULCERS  *URINARY PROBLEMS  *BOWEL PROBLEMS  UNUSUAL RASH Items with * indicate a potential emergency and should be followed up as soon as possible.  Feel free to call the clinic you have any questions or concerns. The clinic phone number is [phone].  Please show the CHEMO ALERT CARD at check-in to the Emergency Department and triage nurse.   

## 2015-02-27 NOTE — Progress Notes (Signed)
Patient arrived with PAC accessed and TPA indwelling overnight. Excellent blood return obtained and  10cc serum removed from [facility].  Labs drawn, PAC flushed with NS X2.  Patient scheduled for chemotherapy this morning.

## 2015-02-28 ENCOUNTER — Other Ambulatory Visit: Payer: Self-pay | Admitting: Oncology

## 2015-02-28 DIAGNOSIS — C563 Malignant neoplasm of bilateral ovaries: Secondary | ICD-10-CM

## 2015-02-28 DIAGNOSIS — C562 Malignant neoplasm of left ovary: Principal | ICD-10-CM

## 2015-02-28 DIAGNOSIS — C561 Malignant neoplasm of right ovary: Secondary | ICD-10-CM

## 2015-02-28 DIAGNOSIS — D701 Agranulocytosis secondary to cancer chemotherapy: Secondary | ICD-10-CM | POA: Insufficient documentation

## 2015-02-28 DIAGNOSIS — Z6841 Body Mass Index (BMI) 40.0 and over, adult: Secondary | ICD-10-CM

## 2015-02-28 DIAGNOSIS — T451X5A Adverse effect of antineoplastic and immunosuppressive drugs, initial encounter: Secondary | ICD-10-CM | POA: Insufficient documentation

## 2015-02-28 LAB — CA 125: CA 125: 37 U/mL — ABNORMAL HIGH (ref <35)

## 2015-03-05 ENCOUNTER — Other Ambulatory Visit: Payer: Self-pay | Admitting: Oncology

## 2015-03-05 ENCOUNTER — Other Ambulatory Visit (HOSPITAL_BASED_OUTPATIENT_CLINIC_OR_DEPARTMENT_OTHER): Payer: BLUE CROSS/BLUE SHIELD

## 2015-03-05 ENCOUNTER — Ambulatory Visit (HOSPITAL_BASED_OUTPATIENT_CLINIC_OR_DEPARTMENT_OTHER): Payer: BLUE CROSS/BLUE SHIELD

## 2015-03-05 ENCOUNTER — Ambulatory Visit (HOSPITAL_BASED_OUTPATIENT_CLINIC_OR_DEPARTMENT_OTHER): Payer: BLUE CROSS/BLUE SHIELD | Admitting: Oncology

## 2015-03-05 ENCOUNTER — Encounter: Payer: Self-pay | Admitting: Oncology

## 2015-03-05 ENCOUNTER — Ambulatory Visit: Payer: BLUE CROSS/BLUE SHIELD

## 2015-03-05 ENCOUNTER — Other Ambulatory Visit: Payer: Self-pay

## 2015-03-05 VITALS — BP 129/76 | HR 85 | Temp 99.7°F | Resp 18

## 2015-03-05 VITALS — BP 129/76 | HR 85 | Temp 99.7°F | Resp 18 | Ht 68.0 in | Wt 283.6 lb

## 2015-03-05 DIAGNOSIS — C563 Malignant neoplasm of bilateral ovaries: Secondary | ICD-10-CM

## 2015-03-05 DIAGNOSIS — C562 Malignant neoplasm of left ovary: Secondary | ICD-10-CM

## 2015-03-05 DIAGNOSIS — M1990 Unspecified osteoarthritis, unspecified site: Secondary | ICD-10-CM

## 2015-03-05 DIAGNOSIS — D701 Agranulocytosis secondary to cancer chemotherapy: Secondary | ICD-10-CM

## 2015-03-05 DIAGNOSIS — C561 Malignant neoplasm of right ovary: Secondary | ICD-10-CM

## 2015-03-05 DIAGNOSIS — Z95828 Presence of other vascular implants and grafts: Secondary | ICD-10-CM

## 2015-03-05 DIAGNOSIS — E039 Hypothyroidism, unspecified: Secondary | ICD-10-CM

## 2015-03-05 DIAGNOSIS — J3089 Other allergic rhinitis: Secondary | ICD-10-CM

## 2015-03-05 DIAGNOSIS — G620 Drug-induced polyneuropathy: Secondary | ICD-10-CM

## 2015-03-05 DIAGNOSIS — R530 Neoplastic (malignant) related fatigue: Secondary | ICD-10-CM

## 2015-03-05 DIAGNOSIS — F411 Generalized anxiety disorder: Secondary | ICD-10-CM

## 2015-03-05 DIAGNOSIS — R10816 Epigastric abdominal tenderness: Secondary | ICD-10-CM

## 2015-03-05 DIAGNOSIS — H9202 Otalgia, left ear: Secondary | ICD-10-CM

## 2015-03-05 DIAGNOSIS — T451X5A Adverse effect of antineoplastic and immunosuppressive drugs, initial encounter: Secondary | ICD-10-CM

## 2015-03-05 LAB — COMPREHENSIVE METABOLIC PANEL (CC13)
ALK PHOS: 54 U/L (ref 40–150)
ALT: 38 U/L (ref 0–55)
AST: 25 U/L (ref 5–34)
Albumin: 3.8 g/dL (ref 3.5–5.0)
Anion Gap: 7 mEq/L (ref 3–11)
BILIRUBIN TOTAL: 0.85 mg/dL (ref 0.20–1.20)
BUN: 11.8 mg/dL (ref 7.0–26.0)
CO2: 27 mEq/L (ref 22–29)
Calcium: 9.4 mg/dL (ref 8.4–10.4)
Chloride: 103 mEq/L (ref 98–109)
Creatinine: 0.7 mg/dL (ref 0.6–1.1)
GLUCOSE: 107 mg/dL (ref 70–140)
Potassium: 3.6 mEq/L (ref 3.5–5.1)
SODIUM: 137 meq/L (ref 136–145)
TOTAL PROTEIN: 6.9 g/dL (ref 6.4–8.3)

## 2015-03-05 LAB — CBC WITH DIFFERENTIAL/PLATELET
BASO%: 0.2 % (ref 0.0–2.0)
BASOS ABS: 0 10*3/uL (ref 0.0–0.1)
EOS ABS: 0.1 10*3/uL (ref 0.0–0.5)
EOS%: 1.1 % (ref 0.0–7.0)
HEMATOCRIT: 33.9 % — AB (ref 34.8–46.6)
HEMOGLOBIN: 11.7 g/dL (ref 11.6–15.9)
LYMPH#: 2 10*3/uL (ref 0.9–3.3)
LYMPH%: 43.9 % (ref 14.0–49.7)
MCH: 30 pg (ref 25.1–34.0)
MCHC: 34.5 g/dL (ref 31.5–36.0)
MCV: 86.9 fL (ref 79.5–101.0)
MONO#: 0.1 10*3/uL (ref 0.1–0.9)
MONO%: 2.2 % (ref 0.0–14.0)
NEUT#: 2.4 10*3/uL (ref 1.5–6.5)
NEUT%: 52.6 % (ref 38.4–76.8)
PLATELETS: 228 10*3/uL (ref 145–400)
RBC: 3.9 10*6/uL (ref 3.70–5.45)
RDW: 15.3 % — AB (ref 11.2–14.5)
WBC: 4.6 10*3/uL (ref 3.9–10.3)

## 2015-03-05 MED ORDER — HEPARIN SOD (PORK) LOCK FLUSH 100 UNIT/ML IV SOLN
500.0000 [IU] | Freq: Once | INTRAVENOUS | Status: AC
  Administered 2015-03-05: 500 [IU]
  Filled 2015-03-05: qty 5

## 2015-03-05 MED ORDER — TBO-FILGRASTIM 480 MCG/0.8ML ~~LOC~~ SOSY
480.0000 ug | PREFILLED_SYRINGE | Freq: Once | SUBCUTANEOUS | Status: AC
  Administered 2015-03-05: 480 ug via SUBCUTANEOUS
  Filled 2015-03-05: qty 0.8

## 2015-03-05 MED ORDER — SODIUM CHLORIDE 0.9 % IJ SOLN
10.0000 mL | Freq: Once | INTRAMUSCULAR | Status: AC
  Administered 2015-03-05: 10 mL
  Filled 2015-03-05: qty 10

## 2015-03-05 MED ORDER — LORAZEPAM 1 MG PO TABS: ORAL_TABLET | ORAL | Status: DC

## 2015-03-05 MED ORDER — ONDANSETRON HCL 8 MG PO TABS: 8.0000 mg | ORAL_TABLET | Freq: Three times a day (TID) | ORAL | Status: DC | PRN

## 2015-03-05 NOTE — Progress Notes (Signed)
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 She will have granix today and 03-06-15.  Patient is tolerating most recent treatment with all of supportive measures. She was more constipated for first few days after chemo, did not begin miralax until she was already constipated and should do this starting day of chemo with next cycle. Aches have been tolerable. Peripheral neuropathy not worse, some difficulty with very fine motor activities as with necklace clasps. Prn antiemetics managing nausea. She has had sinus congestion with more post nasal drainage, and new pain superior to left ear, no fever. She is not using medications for allergies now, does have Claritin D.   PAC placed by IR 12-04-14 Genetics testing sent 12-17-14 normal ([facility]vaNext) Pre op CA 125 554 She will have flu vaccine after completing chemo in Nov.  ONCOLOGIC HISTORY Patient had history of endometriosis and infertility, with no abnormal PAP smears but no recent gyn exams. She was in usual health until a few weeks of intermittent low grade nausea, then <24 hours of progressively more severe right upper quadrant abdominal pain. She presented to [facility] ED with the abdominal pain on 11-09-14, with abdominal US not remarkable, then CT AP demonstrated bilateral solid and cystic ovarian masses in total measuring 14 x 10 x 11 cm. She was transferred to [facility] Dr Thurston Pounds, with preoperative CA 125 of 554. On 11-10-14 she had exploratory laparotomy with supracervical hysterectomy, BSO, infracolic omentectomy, bilateral pelvic lymphadenectomy and aortic node sampling. Surgical findings were significant for chocolate colored ascites consistent with ruptured endometrioma, frozen  pelvis consistent with stage IV endometriosis and bilateral retroperitoneal fibrosis requiring bilateral ureterolysis, sigmoid colon densely adherent to uterus and cerix, precluding cervical excision, normal abdominal survey. . Surgical pathology from [facility] [phone]) from 11-10-14 had grade 3 clear cell carcinoma in 6 cm right ovary and 11 cm left ovary with capsules intact, bilateral fallopian tubes not involved, uterus and omentum not involved, no LVSI, for stage IB. No malignant cells in ascites or peritoneal washings. Her postoperative course was uncomplicated. [facility] multidisciplinary conference recommended 6 cycles of adjuvant taxol carboplatin. She will have 6 week post op visit with Dr Thurston Pounds. First carbo taxol was given 0-94-70, complicated by taxol reaction despite full premedication, and severe taxol aches. She was neutropenic on day 14 cycle 3 with ANC 0.6. OvaNext gene panel normal, sent 12-17-14.    Review of systems as above, also: No fever. Bladder ok. No increased swelling LE. NO bleeding. Not SOB Remainder of 10 point Review of Systems negative.  Objective:  Vital signs in last 24 hours:  BP 129/76 mmHg  Pulse 85  Temp(Src) 99.7 F (37.6 C) (Oral)  Resp 18  Ht '5\' 8"'  (1.727 m)  Wt 283 lb 9.6 oz (128.64 kg)  BMI 43.13 kg/m2  LMP 11/02/2014 Weight down 2 lbs.  Alert, oriented and appropriate. Ambulatory without difficulty Alopecia  HEENT:PERRL, sclerae not icteric. Oral mucosa moist without lesions, posterior pharynx clear. No tenderness to palpation across skull above left ear. Left TM dull and retracted, no erythema. Right TM minimally dull, not retracted, no erythema. Neck supple. No JVD.  Lymphatics:no cervical,supraclavicular, axillary or inguinal adenopathy Resp: clear to auscultation bilaterally and normal percussion bilaterally Cardio: regular rate and  rhythm. No gallop. GI: abdomen obese soft, nontender, not distended, no mass or organomegaly. Normally active bowel  sounds. Surgical incision closed, not tender. Musculoskeletal/ Extremities: without pitting edema, cords, tenderness Neuro: peripheral neuropathy tips of fingers and distal feet. Otherwise nonfocal. PSYCH appropriate mood and affect Skin without rash, ecchymosis, petechiae Portacath-without erythema or tenderness  Lab Results:  Results for orders placed or performed in visit on 03/05/15  CBC with Differential  Result Value Ref Range   WBC 4.6 3.9 - 10.3 10e3/uL   NEUT# 2.4 1.5 - 6.5 10e3/uL   HGB 11.7 11.6 - 15.9 g/dL   HCT 33.9 (L) 34.8 - 46.6 %   Platelets 228 145 - 400 10e3/uL   MCV 86.9 79.5 - 101.0 fL   MCH 30.0 25.1 - 34.0 pg   MCHC 34.5 31.5 - 36.0 g/dL   RBC 3.90 3.70 - 5.45 10e6/uL   RDW 15.3 (H) 11.2 - 14.5 %   lymph# 2.0 0.9 - 3.3 10e3/uL   MONO# 0.1 0.1 - 0.9 10e3/uL   Eosinophils Absolute 0.1 0.0 - 0.5 10e3/uL   Basophils Absolute 0.0 0.0 - 0.1 10e3/uL   NEUT% 52.6 38.4 - 76.8 %   LYMPH% 43.9 14.0 - 49.7 %   MONO% 2.2 0.0 - 14.0 %   EOS% 1.1 0.0 - 7.0 %   BASO% 0.2 0.0 - 2.0 %  Comprehensive metabolic panel (Cmet) - [facility]  Result Value Ref Range   Sodium 137 136 - 145 mEq/L   Potassium 3.6 3.5 - 5.1 mEq/L   Chloride 103 98 - 109 mEq/L   CO2 27 22 - 29 mEq/L   Glucose 107 70 - 140 mg/dl   BUN 11.8 7.0 - 26.0 mg/dL   Creatinine 0.7 0.6 - 1.1 mg/dL   Total Bilirubin 0.85 0.20 - 1.20 mg/dL   Alkaline Phosphatase 54 40 - 150 U/L   AST 25 5 - 34 U/L   ALT 38 0 - 55 U/L   Total Protein 6.9 6.4 - 8.3 g/dL   Albumin 3.8 3.5 - 5.0 g/dL   Calcium 9.4 8.4 - 10.4 mg/dL   Anion Gap 7 3 - 11 mEq/L   EGFR >90 >90 ml/min/1.73 m2   CA 125 on 02-27-15   37, this having been 38 on 02-04-15 and 44 on 12-25-14  Studies/Results:  No results found.  Medications: I have reviewed the patient's current medications. Refill lorazepam and zofran. Needs to begin Claritin D today and take daily for now  DISCUSSION: likely environmental allergies with some Eustachean tube dysfunction  causing left TM findings and intermittent sharp pains in region of left ear.   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 Plan 6 cycles of q 3 week taxol carboplatin.GCSF added after neutropenia day 14 cycle 3. Continuing to support with other side effects as best possible, including EMEND, pain medication, bowel regimen. Appreciate support staff assistance. I will see her again 11-10 prior to cycle 6 chemo 03-20-15. Plan repeat imaging and visit back to Dr Thurston Pounds after chemo completes. Note CA 125 not quite to normal range. 2. Chemo neutropenia necessitating gCSF 3.PAC in: functioned easily for blood draw today 4. 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 Will reevaluate at visit 11-10, just prior to cycle 6. 5.intermittent discomfort in groins: etiology not clear, but could be hip related. Follow 6.surgical menopause: hot flashes still bothersome at times despite celexa and gabapentin, gyn  oncologist prefers no estrogen. . 7. Taxol aches: managed with dilaudid and tramadol. She is very sensitive to aches  8.hypothyroidism: to follow up with PCP, on synthroid 9.morbid obesity, BMI 45. Had begun to lose weight biking prior to this diagnosis, but has not been able to continue exercise program (biking) with chemotherapy 10.hx migraines, no problem with zofran  11.anaphylaxis to avelox, rash to sulfa. Tolerated keflex without difficulty 12.possible slight lymphedema LE related to pelvic surgery. May need referral to lymphedema PT, but has not mentioned this again recently 13.environmental allergie:  Eustachean tube dysfunction and some left ear discomfort. Add OTC decongestant and claritin. 14.fatigue with chemo, neutropenia, sleep problems.  98.XQJJHER complicating other symptoms: appreciate [facility] chaplain assisting 16.Epigastric tenderness: improved with addition of carafate. The GERD may have  worsened chemo nausea additionally. EMEND helpful with nausea, continue. PCP may need to follow up; I do not see H.pylori testing 17.. Degenerative arthritis right knee 18.for flu vaccine to be given after chem  All questions answered. She knows to call if symptoms from sinuses/ left ear do not improve, or if other concerns. Time spent 25 min including >50% counseling and coordination of care.  CC Dr Thurston Pounds and I will let her know that patient will need visit back ~ Dec, with scans here or at [facility] whichever Dr Thurston Pounds and insurance prefer.   LIVESAY,LENNIS P, MD   03/05/2015, 4:44 PM

## 2015-03-06 ENCOUNTER — Ambulatory Visit (HOSPITAL_BASED_OUTPATIENT_CLINIC_OR_DEPARTMENT_OTHER): Payer: BLUE CROSS/BLUE SHIELD

## 2015-03-06 VITALS — BP 123/73 | HR 82 | Temp 98.4°F

## 2015-03-06 DIAGNOSIS — C563 Malignant neoplasm of bilateral ovaries: Secondary | ICD-10-CM

## 2015-03-06 DIAGNOSIS — D701 Agranulocytosis secondary to cancer chemotherapy: Secondary | ICD-10-CM | POA: Diagnosis not present

## 2015-03-06 DIAGNOSIS — C561 Malignant neoplasm of right ovary: Secondary | ICD-10-CM

## 2015-03-06 DIAGNOSIS — C562 Malignant neoplasm of left ovary: Principal | ICD-10-CM

## 2015-03-06 MED ORDER — TBO-FILGRASTIM 480 MCG/0.8ML ~~LOC~~ SOSY
480.0000 ug | PREFILLED_SYRINGE | Freq: Once | SUBCUTANEOUS | Status: AC
  Administered 2015-03-06: 480 ug via SUBCUTANEOUS
  Filled 2015-03-06: qty 0.8

## 2015-03-16 ENCOUNTER — Other Ambulatory Visit: Payer: Self-pay | Admitting: Nurse Practitioner

## 2015-03-16 ENCOUNTER — Ambulatory Visit (HOSPITAL_BASED_OUTPATIENT_CLINIC_OR_DEPARTMENT_OTHER): Payer: BLUE CROSS/BLUE SHIELD

## 2015-03-16 ENCOUNTER — Telehealth: Payer: Self-pay | Admitting: Nurse Practitioner

## 2015-03-16 ENCOUNTER — Telehealth: Payer: Self-pay

## 2015-03-16 ENCOUNTER — Other Ambulatory Visit (HOSPITAL_COMMUNITY)
Admission: RE | Admit: 2015-03-16 | Discharge: 2015-03-16 | Disposition: A | Discharge status: Home / Self Care | Payer: BLUE CROSS/BLUE SHIELD | Source: Ambulatory Visit | Attending: Oncology | Admitting: Oncology

## 2015-03-16 ENCOUNTER — Other Ambulatory Visit: Payer: Self-pay | Admitting: *Deleted

## 2015-03-16 ENCOUNTER — Ambulatory Visit (HOSPITAL_BASED_OUTPATIENT_CLINIC_OR_DEPARTMENT_OTHER)
Admission: RE | Admit: 2015-03-16 | Discharge: 2015-03-16 | Disposition: A | Discharge status: Home / Self Care | Payer: BLUE CROSS/BLUE SHIELD | Source: Ambulatory Visit | Attending: Nurse Practitioner | Admitting: Nurse Practitioner

## 2015-03-16 ENCOUNTER — Encounter: Payer: Self-pay | Admitting: Nurse Practitioner

## 2015-03-16 ENCOUNTER — Ambulatory Visit (HOSPITAL_COMMUNITY)
Admission: RE | Admit: 2015-03-16 | Discharge: 2015-03-16 | Disposition: A | Discharge status: Home / Self Care | Payer: BLUE CROSS/BLUE SHIELD | Source: Ambulatory Visit | Attending: Nurse Practitioner | Admitting: Nurse Practitioner

## 2015-03-16 ENCOUNTER — Ambulatory Visit (HOSPITAL_BASED_OUTPATIENT_CLINIC_OR_DEPARTMENT_OTHER): Payer: BLUE CROSS/BLUE SHIELD | Admitting: Nurse Practitioner

## 2015-03-16 VITALS — BP 129/77 | HR 85 | Temp 98.2°F | Resp 19 | Ht 68.0 in | Wt 289.0 lb

## 2015-03-16 DIAGNOSIS — C561 Malignant neoplasm of right ovary: Secondary | ICD-10-CM

## 2015-03-16 DIAGNOSIS — R601 Generalized edema: Secondary | ICD-10-CM

## 2015-03-16 DIAGNOSIS — R600 Localized edema: Secondary | ICD-10-CM

## 2015-03-16 DIAGNOSIS — T80212A Local infection due to central venous catheter, initial encounter: Secondary | ICD-10-CM

## 2015-03-16 DIAGNOSIS — J069 Acute upper respiratory infection, unspecified: Secondary | ICD-10-CM | POA: Diagnosis not present

## 2015-03-16 DIAGNOSIS — T82897A Other specified complication of cardiac prosthetic devices, implants and grafts, initial encounter: Secondary | ICD-10-CM

## 2015-03-16 DIAGNOSIS — L03313 Cellulitis of chest wall: Secondary | ICD-10-CM | POA: Insufficient documentation

## 2015-03-16 DIAGNOSIS — C562 Malignant neoplasm of left ovary: Secondary | ICD-10-CM | POA: Diagnosis not present

## 2015-03-16 DIAGNOSIS — C563 Malignant neoplasm of bilateral ovaries: Secondary | ICD-10-CM

## 2015-03-16 DIAGNOSIS — R609 Edema, unspecified: Secondary | ICD-10-CM | POA: Insufficient documentation

## 2015-03-16 DIAGNOSIS — T829XXA Unspecified complication of cardiac and vascular prosthetic device, implant and graft, initial encounter: Secondary | ICD-10-CM

## 2015-03-16 LAB — CBC WITH DIFFERENTIAL/PLATELET
BASO%: 0.2 % (ref 0.0–2.0)
Basophils Absolute: 0 10*3/uL (ref 0.0–0.1)
EOS%: 1.3 % (ref 0.0–7.0)
Eosinophils Absolute: 0.1 10*3/uL (ref 0.0–0.5)
HEMATOCRIT: 35.7 % (ref 34.8–46.6)
HEMOGLOBIN: 12.2 g/dL (ref 11.6–15.9)
LYMPH#: 2.1 10*3/uL (ref 0.9–3.3)
LYMPH%: 25.6 % (ref 14.0–49.7)
MCH: 30.4 pg (ref 25.1–34.0)
MCHC: 34.2 g/dL (ref 31.5–36.0)
MCV: 89 fL (ref 79.5–101.0)
MONO#: 0.7 10*3/uL (ref 0.1–0.9)
MONO%: 8 % (ref 0.0–14.0)
NEUT#: 5.3 10*3/uL (ref 1.5–6.5)
NEUT%: 64.9 % (ref 38.4–76.8)
PLATELETS: 248 10*3/uL (ref 145–400)
RBC: 4.01 10*6/uL (ref 3.70–5.45)
RDW: 15.9 % — AB (ref 11.2–14.5)
WBC: 8.2 10*3/uL (ref 3.9–10.3)

## 2015-03-16 LAB — COMPREHENSIVE METABOLIC PANEL (CC13)
ALBUMIN: 3.6 g/dL (ref 3.5–5.0)
ALK PHOS: 55 U/L (ref 40–150)
ALT: 32 U/L (ref 0–55)
AST: 25 U/L (ref 5–34)
Anion Gap: 7 mEq/L (ref 3–11)
BILIRUBIN TOTAL: 0.53 mg/dL (ref 0.20–1.20)
BUN: 6.1 mg/dL — AB (ref 7.0–26.0)
CALCIUM: 9.6 mg/dL (ref 8.4–10.4)
CO2: 29 mEq/L (ref 22–29)
CREATININE: 0.8 mg/dL (ref 0.6–1.1)
Chloride: 106 mEq/L (ref 98–109)
EGFR: >90 mL/min/{1.73_m2} (ref >90)
Glucose: 83 mg/dl (ref 70–140)
Potassium: 4.1 mEq/L (ref 3.5–5.1)
Sodium: 142 mEq/L (ref 136–145)
TOTAL PROTEIN: 6.7 g/dL (ref 6.4–8.3)

## 2015-03-16 LAB — BRAIN NATRIURETIC PEPTIDE: B NATRIURETIC PEPTIDE 5: 22.8 pg/mL (ref 0.0–100.0)

## 2015-03-16 MED ORDER — HYDROCHLOROTHIAZIDE 12.5 MG PO CAPS: ORAL_CAPSULE | ORAL | Status: DC

## 2015-03-16 NOTE — Progress Notes (Signed)
SYMPTOM MANAGEMENT CLINIC   HPI: [name] 44 y.o. female diagnosed with ovarian cancer.  Currently no one carboplatin/Taxol chemotherapy regimen.  Patient has a history of chronic, mild peripheral edema to her bilateral lower extremities.  She states that she was previously on HCTZ; but discontinued this several years ago.  Patient reports development of significant bilateral lower extremity edema over this past weekend.  She denies any chest pain, chest pressure, shortness breath, or pain with inspiration.  She also denies any calf pain or tenderness.  She denies any recent fevers or chills.  HPI  ROS  Past Medical History  Diagnosis Date  . Anxiety   . Endometriosis   . Ovarian cancer, bilateral ([place]) 11/28/2014  . Thyroid disease     Past Surgical History  Procedure Laterality Date  . Abdominal hysterectomy  11/10/14    at [facility], Exp lap, supracervical hyst, BSO, infracolic omentectomy, lymphadenectomy, aortic lymph node sampling    has OBESITY, MORBID; Anxiety state; DEPRESSION; MIGRAINE HEADACHE; Unspecified otitis media; EAR PAIN, BILATERAL; Acute sinusitis; ALLERGIC RHINITIS; Acute bronchospasm; GERD; ENDOMETRIOSIS; INGROWN TOENAIL; KNEE PAIN, RIGHT; BACK PAIN; Insomnia; RASH-NONVESICULAR; Edema; Lower back pain; Dyspnea; Right knee pain; Wellness examination; Hypothyroidism; Hypersomnolence; Postoperative cellulitis of surgical wound; Ovarian cancer, bilateral ([place]); Dysuria; Poor venous access; Morbid obesity with BMI of 40.0-44.9, adult ([place]); Hypersensitivity reaction; Myalgia; Premature surgical menopause; Leukopenia due to antineoplastic chemotherapy; Port catheter in place; Encounter for antineoplastic chemotherapy; Chemotherapy induced nausea and vomiting; Anxiety; Chemotherapy-induced peripheral neuropathy ([place]); Genetic testing; Acute folliculitis; Morbid obesity with BMI of 45.0-49.9, adult ([place]); Chemotherapy induced neutropenia ([place]); Peripheral edema; Central  line complication; and URI (upper respiratory infection) on her problem list.    is allergic to moxifloxacin; quinolones; doxycycline; escitalopram oxalate; and sulfonamide derivatives.    Medication List       This list is accurate as of: 03/16/15  5:00 PM.  Always use your most recent med list.               ALPRAZolam 1 MG tablet  Commonly known as:  XANAX  1/2 tab by mouth twice per day as needed     aspirin-acetaminophen-caffeine 250-250-65 MG tablet  Commonly known as:  EXCEDRIN MIGRAINE  Take 2 tablets by mouth 2 (two) times daily as needed for headache or migraine.     citalopram 40 MG tablet  Commonly known as:  CELEXA  Take 1 tablet (40 mg total) by mouth daily.     dexamethasone 4 MG tablet  Commonly known as:  DECADRON  Take 5 tablets with food 12 hrs and 6 hrs prior to Taxol chemotherapy     diclofenac sodium 1 % Gel  Commonly known as:  VOLTAREN  Use topically four times per day as needed for pain     gabapentin 300 MG capsule  Commonly known as:  NEURONTIN  Take 300 mg by mouth 3 (three) times daily.     hydrochlorothiazide 12.5 MG capsule  Commonly known as:  MICROZIDE  Take 1 capsule PO QD PRN only.     HYDROmorphone 4 MG tablet  Commonly known as:  DILAUDID  Take 1/2 to 1 whole tab (2 mg to 4 mg) Q 4-6 hours PRN severe pain.     levothyroxine 25 MCG tablet  Commonly known as:  LEVOTHROID  Take 1 tablet (25 mcg total) by mouth daily before breakfast.     lidocaine-prilocaine cream  Commonly known as:  EMLA  Apply 1-2 hrs prior to [facility] access  LORazepam 1 MG tablet  Commonly known as:  ATIVAN  Place 1/2 -1 tablet under the tongue or swallow every 6 hours as needed for nausea. Will make drowsy / forgetful around each dose.     ondansetron 8 MG tablet  Commonly known as:  ZOFRAN  Take 1 tablet (8 mg total) by mouth every 8 (eight) hours as needed for nausea or vomiting. Will not make drowsy.     pantoprazole 40 MG tablet  Commonly  known as:  PROTONIX  Take 1 tablet (40 mg total) by mouth daily.     prochlorperazine 10 MG tablet  Commonly known as:  COMPAZINE  Take 1 tablet (10 mg total) by mouth every 6 (six) hours as needed for nausea or vomiting.     sucralfate 1 G tablet  Commonly known as:  CARAFATE  Take 1 tablet (1 g total) by mouth 4 (four) times daily -  before meals and at bedtime.     temazepam 15 MG capsule  Commonly known as:  RESTORIL  Take 1 capsule (15 mg total) by mouth at bedtime as needed for sleep (Do not take with other sedating meds).     traMADol 50 MG tablet  Commonly known as:  ULTRAM  Take 1 tablet (50 mg total) by mouth every 6 (six) hours as needed for moderate pain.     triamcinolone cream 0.1 %  Commonly known as:  KENALOG  Apply 1 application topically 2 (two) times daily.     VITAMIN B-12 PO  Take 2 tablets by mouth daily.         PHYSICAL EXAMINATION  Oncology Vitals 03/16/2015 03/06/2015 03/05/2015 03/05/2015 03/05/2015 02/27/2015 02/26/2015  Height 173 cm - 173 cm - - - 173 cm  Weight 131.09 kg - 128.64 kg - - - 129.593 kg  Weight (lbs) 289 lbs - 283 lbs 10 oz - - - 285 lbs 11 oz  BMI (kg/m2) 43.94 kg/m2 - 43.12 kg/m2 - - - 43.44 kg/m2  Temp 98.2 98.4 99.7 99.7 99.2 97.9 99.2  Pulse 85 82 85 85 102 102 94  Resp 19 - 18 - _0 SpO2 99 - - - 99 98 98  BSA (m2) 2.51 m2 - 2.48 m2 - - - 2.49 m2   BP Readings from Last 3 Encounters:  03/16/15 129/77  03/06/15 123/73  03/05/15 129/76    Physical Exam  Constitutional: She is oriented to person, place, and time.  Patient with alopecia; wearing a scarf to her head.  Patient is morbidly obese.  HENT:  Head: Normocephalic and atraumatic.  Eyes: Conjunctivae and EOM are normal. Pupils are equal, round, and reactive to light. Right eye exhibits no discharge. Left eye exhibits no discharge. No scleral icterus.  Neck: Normal range of motion. Neck supple. No JVD present. No tracheal deviation present. No thyromegaly  present.  Cardiovascular: Normal rate, regular rhythm, normal heart sounds and intact distal pulses.   Pulmonary/Chest: Effort normal and breath sounds normal. No respiratory distress. She has no wheezes. She has no rales. She exhibits no tenderness.  Abdominal: Soft. Bowel sounds are normal. She exhibits no distension and no mass. There is no tenderness. There is no rebound and no guarding.  Musculoskeletal: Normal range of motion. She exhibits edema. She exhibits no tenderness.  Patient is obese-but it does appear that patient's bilateral lower extremities have significant edema-with the left slightly larger than the right.  She also appears to have some edema to her  bilateral wrist as well.  There is no calf tenderness on exam.  Lymphadenopathy:    She has no cervical adenopathy.  Neurological: She is alert and oriented to person, place, and time. Gait normal.  Skin: Skin is warm and dry. No rash noted. No erythema. No pallor.  Right upper chest Port-A-Cath site with previous needle insertion site.  Healing scab.  There is no surrounding edema, erythema, warmth, tenderness, or red streaks.  Psychiatric: Affect normal.  Nursing note and vitals reviewed.   LABORATORY DATA:. Hospital Outpatient Visit on 03/16/2015  Component Date Value Ref Range Status  . B Natriuretic Peptide 03/16/2015 22.8  0.0 - 100.0 pg/mL Final  Appointment on 03/16/2015  Component Date Value Ref Range Status  . WBC 03/16/2015 8.2  3.9 - 10.3 10e3/uL Final  . NEUT# 03/16/2015 5.3  1.5 - 6.5 10e3/uL Final  . HGB 03/16/2015 12.2  11.6 - 15.9 g/dL Final  . HCT 03/16/2015 35.7  34.8 - 46.6 % Final  . Platelets 03/16/2015 248  145 - 400 10e3/uL Final  . MCV 03/16/2015 89.0  79.5 - 101.0 fL Final  . MCH 03/16/2015 30.4  25.1 - 34.0 pg Final  . MCHC 03/16/2015 34.2  31.5 - 36.0 g/dL Final  . RBC 03/16/2015 4.01  3.70 - 5.45 10e6/uL Final  . RDW 03/16/2015 15.9* 11.2 - 14.5 % Final  . lymph# 03/16/2015 2.1  0.9 - 3.3  10e3/uL Final  . MONO# 03/16/2015 0.7  0.1 - 0.9 10e3/uL Final  . Eosinophils Absolute 03/16/2015 0.1  0.0 - 0.5 10e3/uL Final  . Basophils Absolute 03/16/2015 0.0  0.0 - 0.1 10e3/uL Final  . NEUT% 03/16/2015 64.9  38.4 - 76.8 % Final  . LYMPH% 03/16/2015 25.6  14.0 - 49.7 % Final  . MONO% 03/16/2015 8.0  0.0 - 14.0 % Final  . EOS% 03/16/2015 1.3  0.0 - 7.0 % Final  . BASO% 03/16/2015 0.2  0.0 - 2.0 % Final  . Sodium 03/16/2015 142  136 - 145 mEq/L Final  . Potassium 03/16/2015 4.1  3.5 - 5.1 mEq/L Final  . Chloride 03/16/2015 106  98 - 109 mEq/L Final  . CO2 03/16/2015 29  22 - 29 mEq/L Final  . Glucose 03/16/2015 83  70 - 140 mg/dl Final   Glucose reference range is for nonfasting patients. Fasting glucose reference range is 70- 100.  Marland Kitchen BUN 03/16/2015 6.1* 7.0 - 26.0 mg/dL Final  . Creatinine 03/16/2015 0.8  0.6 - 1.1 mg/dL Final  . Total Bilirubin 03/16/2015 0.53  0.20 - 1.20 mg/dL Final  . Alkaline Phosphatase 03/16/2015 55  40 - 150 U/L Final  . AST 03/16/2015 25  5 - 34 U/L Final  . ALT 03/16/2015 32  0 - 55 U/L Final  . Total Protein 03/16/2015 6.7  6.4 - 8.3 g/dL Final  . Albumin 03/16/2015 3.6  3.5 - 5.0 g/dL Final  . Calcium 03/16/2015 9.6  8.4 - 10.4 mg/dL Final  . Anion Gap 03/16/2015 7  3 - 11 mEq/L Final  . EGFR 03/16/2015 >90  >90 ml/min/1.73 m2 Final   eGFR is calculated using the CKD-EPI Creatinine Equation (2009)   Right chest:    RADIOGRAPHIC STUDIES: Ir Patient Eval Tech 0-60 Mins  03/16/2015  Patient Information  Patient Name Sex DOB SSN  Jesusita, Jocelyn Female 1971-03-31 [id number]  Progress Notes by Jacqulynn Cadet, MD at 03/16/2015 2:15 PM  Author: Jacqulynn Cadet, MD Service: Radiology Author Type: Physician  Filed: 03/16/2015 2:22 PM  Note Time: 03/16/2015 2:15 PM Status: Signed  Editor: Jacqulynn Cadet, MD (Physician)    Expand All Collapse All  Referring Physician(s): Bacon,Cynthia R Chief Complaint: Pustule near port Subjective: 44 yo female with  a right chest wall portacath placed by IR (Dr. Earleen Newport) on 12/04/2014. Port has been working well. Last Friday a small pustule developed on the skin overlying the port at the site of the access needle puncture. The pustule spontaneously expressed and has since been getting better. She has minimal residual tenderness. She denies fever or chills. She has one additional chemotherapy treatment planned for this coming Friday.  Allergies: Moxifloxacin; Quinolones; Doxycycline; Escitalopram oxalate; and Sulfonamide derivatives Medications: Prior to Admission medications  Medication Sig Start Date End Date Taking? Authorizing Provider ALPRAZolam Duanne Moron) 1 MG tablet 1/2 tab by mouth twice per day as needed 06/24/14   Biagio Borg, MD aspirin-acetaminophen-caffeine [employer] MIGRAINE) [phone] MG per tablet Take 2 tablets by mouth 2 (two) times daily as needed for headache or migraine.    Historical Provider, MD citalopram (CELEXA) 40 MG tablet Take 1 tablet (40 mg total) by mouth daily. 01/02/14   Biagio Borg, MD Cyanocobalamin (VITAMIN B-12 PO) Take 2 tablets by mouth daily.    Historical Provider, MD dexamethasone (DECADRON) 4 MG tablet Take 5 tablets with food 12 hrs and 6 hrs prior to Taxol chemotherapy 01/22/15   Lennis Marion Downer, MD diclofenac sodium (VOLTAREN) 1 % GEL Use topically four times per day as needed for pain 06/24/14   Biagio Borg, MD gabapentin (NEURONTIN) 300 MG capsule Take 300 mg by mouth 3 (three) times daily.  11/13/14 11/13/15  Historical Provider, MD HYDROmorphone (DILAUDID) 4 MG tablet Take 1/2 to 1 whole tab (2 mg to 4 mg) Q 4-6 hours PRN severe pain. 02/26/15   Lennis Marion Downer, MD levothyroxine (LEVOTHROID) 25 MCG tablet Take 1 tablet (25 mcg total) by mouth daily before breakfast. 09/10/14   Biagio Borg, MD lidocaine-prilocaine (EMLA) cream Apply 1-2 hrs prior to Porta-Cath access 11/28/14   Lennis P Marko Plume, MD LORazepam (ATIVAN)  1 MG tablet Place 1/2 -1 tablet under the tongue or swallow every 6 hours as needed for nausea. Will make drowsy / forgetful around each dose. 03/05/15   Lennis Marion Downer, MD ondansetron (ZOFRAN) 8 MG tablet Take 1 tablet (8 mg total) by mouth every 8 (eight) hours as needed for nausea or vomiting. Will not make drowsy. 03/05/15   Lennis Marion Downer, MD pantoprazole (PROTONIX) 40 MG tablet Take 1 tablet (40 mg total) by mouth daily. 11/28/14   Lennis Marion Downer, MD prochlorperazine (COMPAZINE) 10 MG tablet Take 1 tablet (10 mg total) by mouth every 6 (six) hours as needed for nausea or vomiting. 12/29/14   Susanne Borders, NP sucralfate (CARAFATE) 1 G tablet Take 1 tablet (1 g total) by mouth 4 (four) times daily - before meals and at bedtime. 01/22/15   Lennis Marion Downer, MD temazepam (RESTORIL) 15 MG capsule Take 1 capsule (15 mg total) by mouth at bedtime as needed for sleep (Do not take with other sedating meds). 01/22/15   Lennis Marion Downer, MD traMADol (ULTRAM) 50 MG tablet Take 1 tablet (50 mg total) by mouth every 6 (six) hours as needed for moderate pain. 02/26/15   Lennis Marion Downer, MD triamcinolone cream (KENALOG) 0.1 % Apply 1 application topically 2 (two) times daily. 01/02/14   Biagio Borg, MD Vital Signs: LMP 11/02/2014 Physical Exam Cardiovascular: Normal rate. Pulmonary/Chest: Effort normal.  Skin: Small 3 mm focus of erythema of the skin in the center of the port reservoir. No fluctuance, drainage, induration. Mild TTP. Imaging: No results found. Labs: CBC:  Recent Labs (within last 365 days)   Recent Labs  02/12/15 1116 02/27/15 0821 03/05/15 1525 03/16/15 1003 WBC 4.1 11.5* 4.6 8.2 HGB 12.4 13.1 11.7 12.2 HCT 35.8 38.7 33.9* 35.7 PLT 193 304 228 248 COAGS:  Recent Labs (within last 365 days)   Recent Labs  12/04/14 1150 INR 0.99 BMP:  Recent Labs (within last 365 days)   Recent Labs  11/09/14 1504  12/08/14  0859  02/12/15 1116 02/27/15 0821 03/05/15 1525 03/16/15 1004 NA 138 < > 137 < > 138 138 137 142 K 3.8 < > 3.9 < > 3.8 3.9 3.6 4.1 CL 103 --  101 --  --  --  --  --  CO2 27 < > 28 < > 26 20* 27 29 GLUCOSE 104* < > 94 < > 128 256* 107 83 BUN 6 < > 16 < > 13.3 12.5 11.8 6.1* CALCIUM 10.0 < > 8.6* < > 9.5 10.0 9.4 9.6 CREATININE 0.77 < > 0.76 < > 0.8 0.8 0.7 0.8 GFRNONAA >60 --  >60 --  --  --  --  --  GFRAA >60 --  >60 --  --  --  --  --  < > = values in this interval not displayed. LIVER FUNCTION TESTS:  Recent Labs (within last 365 days)   Recent Labs  02/12/15 1116 02/27/15 0821 03/05/15 1525 03/16/15 1004 BILITOT 1.07 0.72 0.85 0.53 AST 37* _0 ALT 55 26 38 32 ALKPHOS 52 69 54 55 PROT 6.8 7.6 6.9 6.7 ALBUMIN 3.7 3.9 3.8 3.6 Assessment and Plan: Focal superficial cellulitis at the needle access site without evidence for infection of the underlying portacatheter. 1.) Keflex 500 mg BID x 7 days 2.) Should be fine for CTX on Friday as long as port not accessed through any residual erythema. Be sure to access through clean skin. SignedJacqulynn Cadet 03/16/2015, 2:15 PM   ASSESSMENT/PLAN:    Ovarian cancer, bilateral Patient received her last cycle of carboplatin/Taxol chemotherapy on 02/27/2015.  She continues to complain of myalgias following each chemotherapy treatment.  Patient received Granix injections for growth factor support on both 03/05/2015 and 03/06/2015.  Blood counts obtained today reveal a WBC of 8.2, ANC 5.3, hemoglobin 12.2, and platelet count 248.  Patient is scheduled to return for labs, flush, and visit on 03/19/2015.  Patient is scheduled for chemotherapy on 03/20/2015.  * Patient needs to receive her influenza shot following her chemotherapy on 03/20/2015.    Peripheral edema Patient has a history of chronic,  mild peripheral edema to her bilateral lower extremities.  She states that she was previously on HCTZ; but discontinued this several years ago.  Patient reports development of significant bilateral lower extremity edema over this past weekend.  She denies any chest pain, chest pressure, shortness breath, or pain with inspiration.  She also denies any calf pain or tenderness.  She denies any recent fevers or chills.  On exam.-Patient does have significant edema to her bilateral lower extremities; as well as some trace edema to her bilateral wrist as well.  It does appear that the left lower extremity is slightly larger than the right.  All pulses are palpable in all extremities are warm.  Differential diagnosis includes: Peripheral edema secondary to chemotherapy, DVT, CHF, and progression of disease process.  Albumen  was within normal limits; so can essentially eliminate hypoalbuminemia.  BNP lab has been added to today's previous labs; and will consult with the patient once this lab result is obtained.  Patient will obtain a Doppler ultrasound of the left lower extremity to rule out DVT today.  Results will be called to the patient as it is we received some.  In the meantime-patient was advised to elevate her legs above the level of the heart whenever necessary; and to remain as active as possible. _____________________________________________________________________________________________  Update: Left lower extremity Doppler ultrasound was negative for DVT.  BNP obtained was 22.8-within normal limits.  After reviewing all findings with Dr. Sabas Sous was advised  Will also prescribe HCTZ again at 12.5 mg daily on an as-needed basis only.  Dr. Marko Plume recommends that patient try the diarrhetic medication only once to twice per week if possible.   Central line complication Patient had a right upper chest Port-A-Cath inserted per interventional radiologist Dr. Earleen Newport on 12/04/2014.  Patient  states that the Port-A-Cath needle insertion site.  Developed a tiny pustule at this site over the weekend; which did eventually rupture.  She denies any recent fevers or chills.  On exam.-It does appear there is a tiny healing scab at the needle insertion site; but no surrounding edema, warmth, erythema, or tenderness.  Patient has been sent to interventional radiology for further evaluation and management of her Port-A-Cath site. _______________________________________________________________________________________  Interventional radiology evaluated patient's Port-A-Cath site; and then prescribed Keflex 500 mg twice daily for a total of 5 days for low-grade cellulitis.  Patient was also advised to avoid having a Port-A-Cath accessed for labs or chemotherapy if there is any remaining erythema, warmth, edema, or other signs of infection.    URI (upper respiratory infection) Patient has been dealing with some apparent seasonal allergies; which basically consist of some occasional nasal congestion and the feeling of her ears plugged bilaterally.  She denies any sore throat pain, specific ear pain, or facial pain.  She also denies any headaches or fever/chills.  Patient has been taking Claritin-D with only minimal effectiveness.  She states that the Claritin also dries her out.  Advised patient to try a an over-the-counter decongested only to see if that helps.   Patient stated understanding of all instructions; and was in agreement with this plan of care. The patient knows to call the clinic with any problems, questions or concerns.   Review/collaboration with Dr. Marko Plume regarding all aspects of patient's visit today.   Total time spent with patient was 40 minutes;  with greater than 35 percent of that time spent in face to face counseling regarding patient's symptoms,  and coordination of care and follow up.  Disclaimer:This dictation was prepared with Dragon/digital dictation along with  [employer]. Any transcriptional errors that result from this process are unintentional.  Drue Second, NP 03/16/2015

## 2015-03-16 NOTE — Assessment & Plan Note (Addendum)
Patient has a history of chronic, mild peripheral edema to her bilateral lower extremities.  She states that she was previously on HCTZ; but discontinued this several years ago.  Patient reports development of significant bilateral lower extremity edema over this past weekend.  She denies any chest pain, chest pressure, shortness breath, or pain with inspiration.  She also denies any calf pain or tenderness.  She denies any recent fevers or chills.  On exam.-Patient does have significant edema to her bilateral lower extremities; as well as some trace edema to her bilateral wrist as well.  It does appear that the left lower extremity is slightly larger than the right.  All pulses are palpable in all extremities are warm.  Differential diagnosis includes: Peripheral edema secondary to chemotherapy, DVT, CHF, and progression of disease process.  Albumen was within normal limits; so can essentially eliminate hypoalbuminemia.  BNP lab has been added to today's previous labs; and will consult with the patient once this lab result is obtained.  Patient will obtain a Doppler ultrasound of the left lower extremity to rule out DVT today.  Results will be called to the patient as it is we received some.  In the meantime-patient was advised to elevate her legs above the level of the heart whenever necessary; and to remain as active as possible. _____________________________________________________________________________________________  Update: Left lower extremity Doppler ultrasound was negative for DVT.  BNP obtained was 22.8-within normal limits.  After reviewing all findings with Dr. Sabas Sous was advised  Will also prescribe HCTZ again at 12.5 mg daily on an as-needed basis only.  Dr. Marko Plume recommends that patient try the diarrhetic medication only once to twice per week if possible.

## 2015-03-16 NOTE — Assessment & Plan Note (Signed)
Patient has been dealing with some apparent seasonal allergies; which basically consist of some occasional nasal congestion and the feeling of her ears plugged bilaterally.  She denies any sore throat pain, specific ear pain, or facial pain.  She also denies any headaches or fever/chills.  Patient has been taking Claritin-D with only minimal effectiveness.  She states that the Claritin also dries her out.  Advised patient to try a an over-the-counter decongested only to see if that helps.

## 2015-03-16 NOTE — Assessment & Plan Note (Addendum)
Patient had a right upper chest Port-A-Cath inserted per interventional radiologist Dr. Earleen Newport on 12/04/2014.  Patient states that the Port-A-Cath needle insertion site.  Developed a tiny pustule at this site over the weekend; which did eventually rupture.  She denies any recent fevers or chills.  On exam.-It does appear there is a tiny healing scab at the needle insertion site; but no surrounding edema, warmth, erythema, or tenderness.  Patient has been sent to interventional radiology for further evaluation and management of her Port-A-Cath site. _______________________________________________________________________________________  Interventional radiology evaluated patient's Port-A-Cath site; and then prescribed Keflex 500 mg twice daily for a total of 5 days for low-grade cellulitis.  Patient was also advised to avoid having a Port-A-Cath accessed for labs or chemotherapy if there is any remaining erythema, warmth, edema, or other signs of infection.

## 2015-03-16 NOTE — Progress Notes (Signed)
Referring Physician(s): Bacon,Cynthia R  Chief Complaint:  Pustule near port  Subjective:  44 yo female with a right chest wall portacath placed by IR (Dr. Earleen Newport) on 12/04/2014.  Port has been working well.  Last Friday a small pustule developed on the skin overlying the port at the site of the access needle puncture. The pustule spontaneously expressed and has since been getting better.  She has minimal residual tenderness.  She denies fever or chills.   She has one additional chemotherapy treatment planned for this coming Friday.   Allergies: Moxifloxacin; Quinolones; Doxycycline; Escitalopram oxalate; and Sulfonamide derivatives  Medications: Prior to Admission medications   Medication Sig Start Date End Date Taking? Authorizing Provider  ALPRAZolam Duanne Moron) 1 MG tablet 1/2 tab by mouth twice per day as needed 06/24/14   Biagio Borg, MD  aspirin-acetaminophen-caffeine [facility] MIGRAINE) [phone] MG per tablet Take 2 tablets by mouth 2 (two) times daily as needed for headache or migraine.    Historical Provider, MD  citalopram (CELEXA) 40 MG tablet Take 1 tablet (40 mg total) by mouth daily. 01/02/14   Biagio Borg, MD  Cyanocobalamin (VITAMIN B-12 PO) Take 2 tablets by mouth daily.    Historical Provider, MD  dexamethasone (DECADRON) 4 MG tablet Take 5 tablets with food 12 hrs and 6 hrs prior to Taxol chemotherapy 01/22/15   Lennis Marion Downer, MD  diclofenac sodium (VOLTAREN) 1 % GEL Use topically four times per day as needed for pain 06/24/14   Biagio Borg, MD  gabapentin (NEURONTIN) 300 MG capsule Take 300 mg by mouth 3 (three) times daily.  11/13/14 11/13/15  Historical Provider, MD  HYDROmorphone (DILAUDID) 4 MG tablet Take 1/2 to 1 whole tab (2 mg to 4 mg) Q 4-6 hours PRN severe pain. 02/26/15   Lennis Marion Downer, MD  levothyroxine (LEVOTHROID) 25 MCG tablet Take 1 tablet (25 mcg total) by mouth daily before breakfast. 09/10/14   Biagio Borg, MD  lidocaine-prilocaine (EMLA) cream  Apply 1-2 hrs prior to Porta-Cath access 11/28/14   Lennis P Marko Plume, MD  LORazepam (ATIVAN) 1 MG tablet Place 1/2 -1 tablet under the tongue or swallow every 6 hours as needed for nausea. Will make drowsy / forgetful around each dose. 03/05/15   Lennis Marion Downer, MD  ondansetron (ZOFRAN) 8 MG tablet Take 1 tablet (8 mg total) by mouth every 8 (eight) hours as needed for nausea or vomiting. Will not make drowsy. 03/05/15   Lennis Marion Downer, MD  pantoprazole (PROTONIX) 40 MG tablet Take 1 tablet (40 mg total) by mouth daily. 11/28/14   Lennis Marion Downer, MD  prochlorperazine (COMPAZINE) 10 MG tablet Take 1 tablet (10 mg total) by mouth every 6 (six) hours as needed for nausea or vomiting. 12/29/14   Susanne Borders, NP  sucralfate (CARAFATE) 1 G tablet Take 1 tablet (1 g total) by mouth 4 (four) times daily -  before meals and at bedtime. 01/22/15   Lennis Marion Downer, MD  temazepam (RESTORIL) 15 MG capsule Take 1 capsule (15 mg total) by mouth at bedtime as needed for sleep (Do not take with other sedating meds). 01/22/15   Lennis Marion Downer, MD  traMADol (ULTRAM) 50 MG tablet Take 1 tablet (50 mg total) by mouth every 6 (six) hours as needed for moderate pain. 02/26/15   Lennis Marion Downer, MD  triamcinolone cream (KENALOG) 0.1 % Apply 1 application topically 2 (two) times daily. 01/02/14   Biagio Borg, MD  Vital Signs: LMP 11/02/2014  Physical Exam  Cardiovascular: Normal rate.   Pulmonary/Chest: Effort normal.  Skin:  Small 3 mm focus of erythema of the skin in the center of the port reservoir.  No fluctuance, drainage, induration.  Mild TTP.     Imaging: No results found.  Labs:  CBC:  Recent Labs  02/12/15 1116 02/27/15 0821 03/05/15 1525 03/16/15 1003  WBC 4.1 11.5* 4.6 8.2  HGB 12.4 13.1 11.7 12.2  HCT 35.8 38.7 33.9* 35.7  PLT 193 304 228 248    COAGS:  Recent Labs  12/04/14 1150  INR 0.99    BMP:  Recent Labs  11/09/14 1504  12/08/14 0859  02/12/15 1116  02/27/15 0821 03/05/15 1525 03/16/15 1004  NA 138  < > 137  < > 138 138 137 142  K 3.8  < > 3.9  < > 3.8 3.9 3.6 4.1  CL 103  --  101  --   --   --   --   --   CO2 27  < > 28  < > 26 20* 27 29  GLUCOSE 104*  < > 94  < > 128 256* 107 83  BUN 6  < > 16  < > 13.3 12.5 11.8 6.1*  CALCIUM 10.0  < > 8.6*  < > 9.5 10.0 9.4 9.6  CREATININE 0.77  < > 0.76  < > 0.8 0.8 0.7 0.8  GFRNONAA >60  --  >60  --   --   --   --   --   GFRAA >60  --  >60  --   --   --   --   --   < > = values in this interval not displayed.  LIVER FUNCTION TESTS:  Recent Labs  02/12/15 1116 02/27/15 0821 03/05/15 1525 03/16/15 1004  BILITOT 1.07 0.72 0.85 0.53  AST 37* 15 25 25   ALT 55 26 38 32  ALKPHOS 52 69 54 55  PROT 6.8 7.6 6.9 6.7  ALBUMIN 3.7 3.9 3.8 3.6    Assessment and Plan:  Focal superficial cellulitis at the needle access site without evidence for infection of the underlying portacatheter.   1.) Keflex 500 mg BID x 7 days 2.) Should be fine for CTX on Friday as long as port not accessed through any residual erythema.  Be sure to access through clean skin.   SignedLaurence Ferrari, [place] 03/16/2015, 2:15 PM

## 2015-03-16 NOTE — Telephone Encounter (Signed)
Pt calling for results of her BNP. Gave her number [phone]. Pt was asking if Cyndee will be calling in an Rx for her edema. Pt stated doppler was negative. She uses [facility] on [address].

## 2015-03-16 NOTE — Progress Notes (Signed)
*  PRELIMINARY RESULTS* Vascular Ultrasound Left lower extremity venous duplex has been completed.  Preliminary findings: negative for DVT and baker's cyst.   Called results to [employer].   Landry Mellow, RDMS, RVT  03/16/2015, 11:35 AM

## 2015-03-16 NOTE — Assessment & Plan Note (Addendum)
Patient received her last cycle of carboplatin/Taxol chemotherapy on 02/27/2015.  She continues to complain of myalgias following each chemotherapy treatment.  Patient received Granix injections for growth factor support on both 03/05/2015 and 03/06/2015.  Blood counts obtained today reveal a WBC of 8.2, ANC 5.3, hemoglobin 12.2, and platelet count 248.  Patient is scheduled to return for labs, flush, and visit on 03/19/2015.  Patient is scheduled for chemotherapy on 03/20/2015.  * Patient needs to receive her influenza shot following her chemotherapy on 03/20/2015.

## 2015-03-16 NOTE — Telephone Encounter (Signed)
Called patient to review.  BNP and Doppler ultrasound results.  Also reviewed her visit with interventional radiology for further evaluation and management of her right chest Port-A-Cath site.  Patient stated understanding of all instructions; and was in agreement with this plan of care.  (See progress note for further details).

## 2015-03-18 ENCOUNTER — Telehealth: Payer: Self-pay | Admitting: *Deleted

## 2015-03-18 ENCOUNTER — Other Ambulatory Visit: Payer: Self-pay | Admitting: Oncology

## 2015-03-18 ENCOUNTER — Telehealth: Payer: Self-pay | Admitting: Oncology

## 2015-03-18 NOTE — Telephone Encounter (Signed)
Cancel flush 11/10 per pof

## 2015-03-18 NOTE — Telephone Encounter (Signed)
Spoke with patient about port concerns. Pt states her port is healing well. She states IR said it was a superficial infection, nothing to be too concerned about. Pt will have labs drawn peripherally tomorrow to give an additional day for healing and her port will be accessed for chemo on Friday. She is very excited that this is her last infusion. She denies any further concerns or questions.

## 2015-03-19 ENCOUNTER — Other Ambulatory Visit (HOSPITAL_BASED_OUTPATIENT_CLINIC_OR_DEPARTMENT_OTHER): Payer: BLUE CROSS/BLUE SHIELD

## 2015-03-19 ENCOUNTER — Telehealth: Payer: Self-pay | Admitting: Oncology

## 2015-03-19 ENCOUNTER — Encounter: Payer: Self-pay | Admitting: Oncology

## 2015-03-19 ENCOUNTER — Ambulatory Visit (HOSPITAL_BASED_OUTPATIENT_CLINIC_OR_DEPARTMENT_OTHER): Payer: BLUE CROSS/BLUE SHIELD | Admitting: Oncology

## 2015-03-19 VITALS — BP 124/92 | HR 90 | Temp 98.0°F | Resp 20 | Ht 68.0 in | Wt 288.7 lb

## 2015-03-19 DIAGNOSIS — G620 Drug-induced polyneuropathy: Secondary | ICD-10-CM

## 2015-03-19 DIAGNOSIS — C562 Malignant neoplasm of left ovary: Secondary | ICD-10-CM | POA: Diagnosis not present

## 2015-03-19 DIAGNOSIS — D701 Agranulocytosis secondary to cancer chemotherapy: Secondary | ICD-10-CM

## 2015-03-19 DIAGNOSIS — M1990 Unspecified osteoarthritis, unspecified site: Secondary | ICD-10-CM

## 2015-03-19 DIAGNOSIS — C563 Malignant neoplasm of bilateral ovaries: Secondary | ICD-10-CM

## 2015-03-19 DIAGNOSIS — R1030 Lower abdominal pain, unspecified: Secondary | ICD-10-CM

## 2015-03-19 DIAGNOSIS — Z95828 Presence of other vascular implants and grafts: Secondary | ICD-10-CM

## 2015-03-19 DIAGNOSIS — Z6841 Body Mass Index (BMI) 40.0 and over, adult: Secondary | ICD-10-CM

## 2015-03-19 DIAGNOSIS — C561 Malignant neoplasm of right ovary: Secondary | ICD-10-CM

## 2015-03-19 DIAGNOSIS — T451X5A Adverse effect of antineoplastic and immunosuppressive drugs, initial encounter: Secondary | ICD-10-CM

## 2015-03-19 DIAGNOSIS — E039 Hypothyroidism, unspecified: Secondary | ICD-10-CM

## 2015-03-19 DIAGNOSIS — R530 Neoplastic (malignant) related fatigue: Secondary | ICD-10-CM

## 2015-03-19 LAB — COMPREHENSIVE METABOLIC PANEL (CC13)
ALBUMIN: 3.6 g/dL (ref 3.5–5.0)
ALK PHOS: 59 U/L (ref 40–150)
ALT: 28 U/L (ref 0–55)
AST: 22 U/L (ref 5–34)
Anion Gap: 8 mEq/L (ref 3–11)
BILIRUBIN TOTAL: 1.04 mg/dL (ref 0.20–1.20)
BUN: 7.2 mg/dL (ref 7.0–26.0)
CALCIUM: 9.5 mg/dL (ref 8.4–10.4)
CO2: 27 mEq/L (ref 22–29)
Chloride: 105 mEq/L (ref 98–109)
Creatinine: 0.8 mg/dL (ref 0.6–1.1)
GLUCOSE: 112 mg/dL (ref 70–140)
POTASSIUM: 3.9 meq/L (ref 3.5–5.1)
Sodium: 140 mEq/L (ref 136–145)
TOTAL PROTEIN: 6.6 g/dL (ref 6.4–8.3)

## 2015-03-19 LAB — CBC WITH DIFFERENTIAL/PLATELET
BASO%: 0.4 % (ref 0.0–2.0)
BASOS ABS: 0 10*3/uL (ref 0.0–0.1)
EOS ABS: 0.1 10*3/uL (ref 0.0–0.5)
EOS%: 0.7 % (ref 0.0–7.0)
HEMATOCRIT: 35.9 % (ref 34.8–46.6)
HEMOGLOBIN: 12 g/dL (ref 11.6–15.9)
LYMPH#: 1.7 10*3/uL (ref 0.9–3.3)
LYMPH%: 19.1 % (ref 14.0–49.7)
MCH: 29.6 pg (ref 25.1–34.0)
MCHC: 33.4 g/dL (ref 31.5–36.0)
MCV: 88.7 fL (ref 79.5–101.0)
MONO#: 0.5 10*3/uL (ref 0.1–0.9)
MONO%: 5.5 % (ref 0.0–14.0)
NEUT%: 74.3 % (ref 38.4–76.8)
NEUTROS ABS: 6.5 10*3/uL (ref 1.5–6.5)
Platelets: 256 10*3/uL (ref 145–400)
RBC: 4.04 10*6/uL (ref 3.70–5.45)
RDW: 17.8 % — AB (ref 11.2–14.5)
WBC: 8.8 10*3/uL (ref 3.9–10.3)

## 2015-03-19 NOTE — Progress Notes (Signed)
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 Plan is for restaging scans then return visit to Dr Thurston Pounds after this last planned chemotherapy cycle.   Patient was seen at symptom management clinic and by IR on 03-16-15, with complaints of bilateral LE swelling over that weekend and pustule at site of [facility] access which had spontaneously drained. LE venous dopplers were negative for clot also 03-16-15 and BNP was not elevated; recommendation was for low dose HCTZ 1-2x weekly prn (not on week of chemo). She elevates legs as possible at work She had no fever or chills, no PAC discomfort, no drainage or erythema after the pustule drained. She was placed on keflex x 1 week. She has had sporadic inguinal discomfort, most recently late in day yesterday. She has returned to work part time, so is sitting in different position, and had worked longer hours yesterday. She has some tightness in low back at times, no pain directly over femoral heads. She had significant degenerative arthritis in right knee in past.  She had more constipation, with some bright red blood which seems related to known hemorrhoids. I have suggested sitz baths regularly. Appetite is better now and she is drinking fluids well She has no neuropathy in hands. Feet have some soreness moreso than numbness for last few days, which is usual pattern.   Remainder of 10 point Review of Systems negative.   PAC placed by IR 12-04-14 Genetics testing sent 12-17-14 normal ([employer]) Pre op CA 125 554 She will have flu vaccine after completing chemo in Nov.  ONCOLOGIC HISTORY Patient had history of endometriosis and infertility, with no abnormal PAP smears but no recent gyn exams. She was  in usual health until a few weeks of intermittent low grade nausea, then <24 hours of progressively more severe right upper quadrant abdominal pain. She [facility] ED with the abdominal pain on 11-09-14, with abdominal US not remarkable, then CT AP demonstrated bilateral solid and cystic ovarian masses in total measuring 14 x 10 x 11 cm. She was transferred to [facility] Dr Thurston Pounds, with preoperative CA 125 of 554. On 11-10-14 she had exploratory laparotomy with supracervical hysterectomy, BSO, infracolic omentectomy, bilateral pelvic lymphadenectomy and aortic node sampling. 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 . Surgical pathology from [facility] [phone]) from 11-10-14 had grade 3 clear cell carcinoma in 6 cm right ovary and 11 cm left ovary with capsules intact, bilateral fallopian tubes not involved, uterus and omentum not involved, no LVSI, for stage IB. No malignant cells in ascites or peritoneal washings. Her postoperative course was uncomplicated. [facility] multidisciplinary conference recommended 6 cycles of adjuvant taxol carboplatin. She will have 6 week post op visit with Dr Thurston Pounds. First carbo taxol was given [id number], complicated by taxol reaction despite full premedication, and severe taxol aches. She was neutropenic on day 14 cycle 3 with ANC 0.6. [employer] gene panel normal, sent 12-17-14.    Objective:  Vital signs in last 24 hours:  BP 124/92 mmHg  Pulse 90  Temp(Src) 98 F (36.7  C) (Oral)  Resp 20  Ht 5\' 8"  (1.727 m)  Wt 288 lb 11.2 oz (130.953 kg)  BMI 43.91 kg/m2  SpO2 99%  LMP 11/02/2014 Weight stable Alert, oriented and appropriate. Ambulatory without assistance.  Alopecia  HEENT:PERRL,  sclerae not icteric. Oral mucosa moist without lesions, posterior pharynx clear.  Neck supple. No JVD.  Lymphatics:no cervical,supraclavicular or inguinal adenopathy Resp: clear to auscultation bilaterally and normal percussion bilaterally Cardio: regular rate and rhythm. No gallop. GI: abdomen obese, soft, nontender, not obviously distended, no appreciable mass or organomegaly. Normally active bowel sounds. Surgical incision not remarkable. Musculoskeletal/ Extremities: without pitting edema, cords, tenderness Neuro:  peripheral neuropathy as noted. Otherwise nonfocal. PSYCH appropriate mood and affect Skin without rash, ecchymosis, petechiae Portacath-last access site in center of PAC visible, no erythema or tenderness, no drainage, no pustule, no findings of concern out onto adjacent chest wall  Lab Results:  Results for orders placed or performed in visit on 03/19/15  CBC with Differential  Result Value Ref Range   WBC 8.8 3.9 - 10.3 10e3/uL   NEUT# 6.5 1.5 - 6.5 10e3/uL   HGB 12.0 11.6 - 15.9 g/dL   HCT 35.9 34.8 - 46.6 %   Platelets 256 145 - 400 10e3/uL   MCV 88.7 79.5 - 101.0 fL   MCH 29.6 25.1 - 34.0 pg   MCHC 33.4 31.5 - 36.0 g/dL   RBC 4.04 3.70 - 5.45 10e6/uL   RDW 17.8 (H) 11.2 - 14.5 %   lymph# 1.7 0.9 - 3.3 10e3/uL   MONO# 0.5 0.1 - 0.9 10e3/uL   Eosinophils Absolute 0.1 0.0 - 0.5 10e3/uL   Basophils Absolute 0.0 0.0 - 0.1 10e3/uL   NEUT% 74.3 38.4 - 76.8 %   LYMPH% 19.1 14.0 - 49.7 %   MONO% 5.5 0.0 - 14.0 %   EOS% 0.7 0.0 - 7.0 %   BASO% 0.4 0.0 - 2.0 %     Studies/Resuts CT AP from 11-09-14 reviewed, as no other imaging in this EMR of hips, no concerns in the radiology report then.   Medications: I have reviewed the patient's current medications. She will complete course of oral keflex.  Low dose HCTZ on occasional basis until seen back by PCP.  DISCUSSION Peripheral neuropathy discussed as above. She is in agreement with treatment with taxol for the  upcoming cycle 6, no changes in dosing; she understands that neuropathy may not resolve entirely after completion of chemotherapy, tho it may improve or may resolve entirely.  She would like restaging scans in [place]. She understands that Dr Thurston Pounds is glad to see her in follow up at [facility], which is her preference for gyn oncology follow up now.   We have discussed bilateral inguinal discomfort, not noticeable now. May be radiation from hips, or could be related to surgery or other. She is known to Dr Netta Cedars, may want to see him if this worsens, note plan CT AP after this cycle chemo.  She requests letter to return to work full time week of Thanksgiving, which is a short week at that office, written and given to patient now.   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 Plan 6 cycles of q 3 week taxol carboplatin.GCSF added after neutropenia day 14 cycle 3. Plan repeat imaging and visit back to Dr Thurston Pounds after chemo completes. Note CA 125 not quite to normal range. 2. Chemo neutropenia necessitating gCSF 3.PAC  in. No findings of concern on exam today, completing oral keflex 4. 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  5.intermittent discomfort in groins: etiology not clear, but could be hip related. Follow 6.surgical menopause: hot flashes still bothersome at times despite celexa and gabapentin, gyn oncologist prefers no estrogen. . 7. Taxol aches: managed with dilaudid and tramadol. She is very sensitive to aches  8.hypothyroidism: to follow up with PCP, on synthroid 9.morbid obesity, BMI 45. Had begun to lose weight biking prior to this diagnosis, but has not been able to continue exercise program (biking) with chemotherapy 10.hx migraines, no problem with zofran  11.anaphylaxis to avelox, rash to sulfa. Tolerates keflex without difficulty 12.possible slight lymphedema LE related to  pelvic surgery. May need referral to lymphedema PT. 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 Add OTC decongestant and claritin. 14.fatigue with chemo, neutropenia, sleep problems.  [id number] complicating other symptoms: appreciate [facility] chaplain assistance 16.Epigastric tenderness: improved with addition of carafate. The GERD may have worsened chemo nausea additionally. EMEND helpful with nausea, continue. PCP may need to follow up; I do not see H.pylori testing 17.. Degenerative arthritis right knee, known to Dr Veverly Fells 18.for flu vaccine to be given after chem  All questions answered. Chemo and granix orders confirmed. I will see her with lab 04-16-15. Cc this note to Dr Thurston Pounds and will request appointment back to her in Dec. Time spent 25 min including >50% counseling and coordination of care.     Brigitt Mcclish P, MD   03/19/2015, 9:08 AM

## 2015-03-19 NOTE — Telephone Encounter (Signed)
Called [facility] de[facility]. And left a message on their voicemail to call back for a follow up appointment per 11/10 pof

## 2015-03-20 ENCOUNTER — Other Ambulatory Visit: Payer: Self-pay | Admitting: Oncology

## 2015-03-20 ENCOUNTER — Ambulatory Visit (HOSPITAL_BASED_OUTPATIENT_CLINIC_OR_DEPARTMENT_OTHER): Payer: BLUE CROSS/BLUE SHIELD

## 2015-03-20 VITALS — BP 155/80 | HR 105 | Temp 98.4°F | Resp 20

## 2015-03-20 DIAGNOSIS — C562 Malignant neoplasm of left ovary: Secondary | ICD-10-CM | POA: Diagnosis not present

## 2015-03-20 DIAGNOSIS — C563 Malignant neoplasm of bilateral ovaries: Secondary | ICD-10-CM

## 2015-03-20 DIAGNOSIS — C561 Malignant neoplasm of right ovary: Secondary | ICD-10-CM

## 2015-03-20 DIAGNOSIS — Z5111 Encounter for antineoplastic chemotherapy: Secondary | ICD-10-CM

## 2015-03-20 MED ORDER — DIPHENHYDRAMINE HCL 50 MG/ML IJ SOLN
INTRAMUSCULAR | Status: AC
  Filled 2015-03-20: qty 1

## 2015-03-20 MED ORDER — FAMOTIDINE IN NACL 20-0.9 MG/50ML-% IV SOLN
INTRAVENOUS | Status: AC
  Filled 2015-03-20: qty 50

## 2015-03-20 MED ORDER — SODIUM CHLORIDE 0.9 % IV SOLN
Freq: Once | INTRAVENOUS | Status: AC
  Administered 2015-03-20: 10:00:00 via INTRAVENOUS
  Filled 2015-03-20: qty 5

## 2015-03-20 MED ORDER — LORAZEPAM 2 MG/ML IJ SOLN
INTRAMUSCULAR | Status: AC
  Filled 2015-03-20: qty 1

## 2015-03-20 MED ORDER — SODIUM CHLORIDE 0.9 % IJ SOLN
10.0000 mL | INTRAMUSCULAR | Status: DC | PRN
  Administered 2015-03-20: 10 mL
  Filled 2015-03-20: qty 10

## 2015-03-20 MED ORDER — SODIUM CHLORIDE 0.9 % IV SOLN
Freq: Once | INTRAVENOUS | Status: AC
  Administered 2015-03-20: 10:00:00 via INTRAVENOUS

## 2015-03-20 MED ORDER — PACLITAXEL CHEMO INJECTION 300 MG/50ML
175.0000 mg/m2 | Freq: Once | INTRAVENOUS | Status: AC
  Administered 2015-03-20: 438 mg via INTRAVENOUS
  Filled 2015-03-20: qty 73

## 2015-03-20 MED ORDER — SODIUM CHLORIDE 0.9 % IV SOLN
750.0000 mg | Freq: Once | INTRAVENOUS | Status: AC
  Administered 2015-03-20: 750 mg via INTRAVENOUS
  Filled 2015-03-20: qty 75

## 2015-03-20 MED ORDER — LORAZEPAM 2 MG/ML IJ SOLN
1.0000 mg | Freq: Once | INTRAMUSCULAR | Status: AC
  Administered 2015-03-20: 1 mg via INTRAVENOUS

## 2015-03-20 MED ORDER — SODIUM CHLORIDE 0.9 % IV SOLN
Freq: Once | INTRAVENOUS | Status: AC
  Administered 2015-03-20: 10:00:00 via INTRAVENOUS
  Filled 2015-03-20: qty 8

## 2015-03-20 MED ORDER — DIPHENHYDRAMINE HCL 50 MG/ML IJ SOLN
50.0000 mg | Freq: Once | INTRAMUSCULAR | Status: AC
  Administered 2015-03-20: 50 mg via INTRAVENOUS

## 2015-03-20 MED ORDER — LORAZEPAM 2 MG/ML IJ SOLN
1.0000 mg | Freq: Once | INTRAMUSCULAR | Status: DC | PRN
  Administered 2015-03-20: 1 mg via INTRAVENOUS

## 2015-03-20 MED ORDER — HEPARIN SOD (PORK) LOCK FLUSH 100 UNIT/ML IV SOLN
500.0000 [IU] | Freq: Once | INTRAVENOUS | Status: AC | PRN
  Administered 2015-03-20: 500 [IU]
  Filled 2015-03-20: qty 5

## 2015-03-20 MED ORDER — FAMOTIDINE IN NACL 20-0.9 MG/50ML-% IV SOLN
20.0000 mg | Freq: Once | INTRAVENOUS | Status: AC
  Administered 2015-03-20: 20 mg via INTRAVENOUS

## 2015-03-20 NOTE — Patient Instructions (Signed)
[facility] Discharge Instructions for Patients Receiving Chemotherapy  Today you received the following chemotherapy agents:  Taxol and Carboplatin  To help prevent nausea and vomiting after your treatment, we encourage you to take your nausea medication as ordered per MD.   If you develop nausea and vomiting that is not controlled by your nausea medication, call the clinic.   BELOW ARE SYMPTOMS THAT SHOULD BE REPORTED IMMEDIATELY:  *FEVER GREATER THAN 100.5 F  *CHILLS WITH OR WITHOUT FEVER  NAUSEA AND VOMITING THAT IS NOT CONTROLLED WITH YOUR NAUSEA MEDICATION  *UNUSUAL SHORTNESS OF BREATH  *UNUSUAL BRUISING OR BLEEDING  TENDERNESS IN MOUTH AND THROAT WITH OR WITHOUT PRESENCE OF ULCERS  *URINARY PROBLEMS  *BOWEL PROBLEMS  UNUSUAL RASH Items with * indicate a potential emergency and should be followed up as soon as possible.  Feel free to call the clinic you have any questions or concerns. The clinic phone number is [phone].  Please show the CHEMO ALERT CARD at check-in to the Emergency Department and triage nurse.   

## 2015-03-24 ENCOUNTER — Other Ambulatory Visit: Payer: Self-pay | Admitting: Oncology

## 2015-03-26 ENCOUNTER — Telehealth: Payer: Self-pay | Admitting: Oncology

## 2015-03-26 ENCOUNTER — Encounter: Payer: Self-pay | Admitting: Oncology

## 2015-03-26 ENCOUNTER — Ambulatory Visit (HOSPITAL_BASED_OUTPATIENT_CLINIC_OR_DEPARTMENT_OTHER): Payer: BLUE CROSS/BLUE SHIELD | Admitting: Oncology

## 2015-03-26 ENCOUNTER — Ambulatory Visit (HOSPITAL_BASED_OUTPATIENT_CLINIC_OR_DEPARTMENT_OTHER): Payer: BLUE CROSS/BLUE SHIELD

## 2015-03-26 ENCOUNTER — Other Ambulatory Visit (HOSPITAL_BASED_OUTPATIENT_CLINIC_OR_DEPARTMENT_OTHER): Payer: BLUE CROSS/BLUE SHIELD

## 2015-03-26 VITALS — BP 122/90 | HR 97 | Temp 98.8°F | Resp 18 | Ht 68.0 in | Wt 280.5 lb

## 2015-03-26 DIAGNOSIS — T451X5A Adverse effect of antineoplastic and immunosuppressive drugs, initial encounter: Secondary | ICD-10-CM

## 2015-03-26 DIAGNOSIS — G620 Drug-induced polyneuropathy: Secondary | ICD-10-CM

## 2015-03-26 DIAGNOSIS — C562 Malignant neoplasm of left ovary: Secondary | ICD-10-CM

## 2015-03-26 DIAGNOSIS — C561 Malignant neoplasm of right ovary: Secondary | ICD-10-CM

## 2015-03-26 DIAGNOSIS — C563 Malignant neoplasm of bilateral ovaries: Secondary | ICD-10-CM

## 2015-03-26 DIAGNOSIS — R040 Epistaxis: Secondary | ICD-10-CM

## 2015-03-26 DIAGNOSIS — Z95828 Presence of other vascular implants and grafts: Secondary | ICD-10-CM

## 2015-03-26 DIAGNOSIS — K219 Gastro-esophageal reflux disease without esophagitis: Secondary | ICD-10-CM

## 2015-03-26 DIAGNOSIS — E039 Hypothyroidism, unspecified: Secondary | ICD-10-CM

## 2015-03-26 DIAGNOSIS — Z23 Encounter for immunization: Secondary | ICD-10-CM | POA: Diagnosis not present

## 2015-03-26 DIAGNOSIS — K649 Unspecified hemorrhoids: Secondary | ICD-10-CM

## 2015-03-26 DIAGNOSIS — D701 Agranulocytosis secondary to cancer chemotherapy: Secondary | ICD-10-CM

## 2015-03-26 DIAGNOSIS — R530 Neoplastic (malignant) related fatigue: Secondary | ICD-10-CM

## 2015-03-26 DIAGNOSIS — Z6841 Body Mass Index (BMI) 40.0 and over, adult: Secondary | ICD-10-CM

## 2015-03-26 DIAGNOSIS — R10816 Epigastric abdominal tenderness: Secondary | ICD-10-CM

## 2015-03-26 LAB — CBC WITH DIFFERENTIAL/PLATELET
BASO%: 0.9 % (ref 0.0–2.0)
BASOS ABS: 0 10*3/uL (ref 0.0–0.1)
EOS ABS: 0 10*3/uL (ref 0.0–0.5)
EOS%: 1.2 % (ref 0.0–7.0)
HCT: 34.8 % (ref 34.8–46.6)
HEMOGLOBIN: 11.7 g/dL (ref 11.6–15.9)
LYMPH#: 1.3 10*3/uL (ref 0.9–3.3)
LYMPH%: 35 % (ref 14.0–49.7)
MCH: 30.1 pg (ref 25.1–34.0)
MCHC: 33.7 g/dL (ref 31.5–36.0)
MCV: 89.2 fL (ref 79.5–101.0)
MONO#: 0.1 10*3/uL (ref 0.1–0.9)
MONO%: 1.9 % (ref 0.0–14.0)
NEUT%: 61 % (ref 38.4–76.8)
NEUTROS ABS: 2.2 10*3/uL (ref 1.5–6.5)
PLATELETS: 211 10*3/uL (ref 145–400)
RBC: 3.9 10*6/uL (ref 3.70–5.45)
RDW: 17 % — ABNORMAL HIGH (ref 11.2–14.5)
WBC: 3.7 10*3/uL — ABNORMAL LOW (ref 3.9–10.3)

## 2015-03-26 LAB — COMPREHENSIVE METABOLIC PANEL (CC13)
ALBUMIN: 3.7 g/dL (ref 3.5–5.0)
ALK PHOS: 50 U/L (ref 40–150)
ALT: 38 U/L (ref 0–55)
AST: 27 U/L (ref 5–34)
Anion Gap: 9 mEq/L (ref 3–11)
BILIRUBIN TOTAL: 1.04 mg/dL (ref 0.20–1.20)
BUN: 14.2 mg/dL (ref 7.0–26.0)
CO2: 25 meq/L (ref 22–29)
CREATININE: 0.8 mg/dL (ref 0.6–1.1)
Calcium: 9.4 mg/dL (ref 8.4–10.4)
Chloride: 104 mEq/L (ref 98–109)
EGFR: 90 mL/min/{1.73_m2} — AB (ref >90)
GLUCOSE: 135 mg/dL (ref 70–140)
Potassium: 3.6 mEq/L (ref 3.5–5.1)
SODIUM: 138 meq/L (ref 136–145)
TOTAL PROTEIN: 6.7 g/dL (ref 6.4–8.3)

## 2015-03-26 LAB — CA 125: CA 125: 32 U/mL (ref <35)

## 2015-03-26 MED ORDER — PANTOPRAZOLE SODIUM 40 MG PO TBEC: DELAYED_RELEASE_TABLET | ORAL | Status: DC

## 2015-03-26 MED ORDER — INFLUENZA VAC SPLIT QUAD 0.5 ML IM SUSY
0.5000 mL | PREFILLED_SYRINGE | Freq: Once | INTRAMUSCULAR | Status: AC
  Administered 2015-03-26: 0.5 mL via INTRAMUSCULAR
  Filled 2015-03-26: qty 0.5

## 2015-03-26 MED ORDER — HYDROCORTISONE ACE-PRAMOXINE 1-1 % RE FOAM
1.0000 | Freq: Two times a day (BID) | RECTAL | Status: DC
Start: 2015-03-26 — End: 2015-05-25

## 2015-03-26 MED ORDER — TBO-FILGRASTIM 480 MCG/0.8ML ~~LOC~~ SOSY
480.0000 ug | PREFILLED_SYRINGE | Freq: Once | SUBCUTANEOUS | Status: AC
  Administered 2015-03-26: 480 ug via SUBCUTANEOUS
  Filled 2015-03-26: qty 0.8

## 2015-03-26 MED ORDER — SODIUM CHLORIDE 0.9 % IJ SOLN
10.0000 mL | INTRAMUSCULAR | Status: DC | PRN
  Administered 2015-03-26: 10 mL via INTRAVENOUS
  Filled 2015-03-26: qty 10

## 2015-03-26 MED ORDER — HEPARIN SOD (PORK) LOCK FLUSH 100 UNIT/ML IV SOLN
500.0000 [IU] | Freq: Once | INTRAVENOUS | Status: AC
  Administered 2015-03-26: 500 [IU] via INTRAVENOUS
  Filled 2015-03-26: qty 5

## 2015-03-26 NOTE — Progress Notes (Signed)
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 She will have granix today and 03-27-15. Plan is for restaging CTs prior to visit back to Dr Van Le in ~ next 4-6 weeks.  Patient has been able to tolerate nausea and taxol aches with use of antiemetics and prn pain medication. Bowels are moving better with miralax tho still some bleeding from hemorrhoids despite sitz baths and OTC hemorrhoid topical; will try proctofoam HC. She has had epistaxis several times in last 2 weeks, including this AM. Epistaxis seems to be bilateral, uses some saline nose spray and does not use claritin now, is known to ENT if needed; discussed ways to stop bleeding without disrupting clot.  Feet are more painful than numb since most recent treatment; hands not too symptomatic. No problems with PAC. No increased swelling LE. No SOB. Bladder ok. No abdominal or pelvic pain. Not complaining of bilateral inguinal pain now.  Remainder of 10 point Review of Systems negative/ unchanged.    PAC placed by IR 12-04-14 Genetics testing sent 12-17-14 normal ([facility]) Pre op CA 125 554 FLu vaccine 03-26-15   ONCOLOGIC HISTORY Patient had history of endometriosis and infertility, with no abnormal PAP smears but no recent gyn exams. She was in usual health until a few weeks of intermittent low grade nausea, then <24 hours of progressively more severe right upper quadrant abdominal pain. She presented to [facility] with the abdominal pain on 11-09-14, with abdominal US not remarkable, then CT AP demonstrated bilateral solid and cystic ovarian masses in total measuring 14 x 10 x 11 cm. She was transferred to [facility] Dr Van Le, with preoperative CA 125 of 554. On 11-10-14 she had exploratory laparotomy with  supracervical hysterectomy, BSO, infracolic omentectomy, bilateral pelvic lymphadenectomy and aortic node sampling. 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 . Surgical pathology from [facility] ([id number]) from 11-10-14 had grade 3 clear cell carcinoma in 6 cm right ovary and 11 cm left ovary with capsules intact, bilateral fallopian tubes not involved, uterus and omentum not involved, no LVSI, for stage IB. No malignant cells in ascites or peritoneal washings. Her postoperative course was uncomplicated. [facility] multidisciplinary conference recommended 6 cycles of adjuvant taxol carboplatin. She will have 6 week post op visit with Dr Van Le. First carbo taxol was given 12-05-14, complicated by taxol reaction despite full premedication, and severe taxol aches. She was neutropenic on day 14 cycle 3 with ANC 0.6. [facility] gene panel normal, sent 12-17-14.    Objective:  Vital signs in last 24 hours:  BP 122/90 mmHg  Pulse 97  Temp(Src) 98.8 F (37.1 C) (Oral)  Resp 18  Ht 5' 8" (1.727 m)  Wt 280 lb 8 oz (127.234 kg)  BMI 42.66 kg/m2  SpO2 98%  LMP 11/02/2014 Weight down 8 lbs. Alert, oriented and appropriate. Ambulatory without assistance  Alopecia  HEENT:PERRL, sclerae not icteric. Oral mucosa moist without lesions, posterior pharynx clear. No bleeding now from nares.  Neck supple. No JVD.  Lymphatics:no cervical,supraclavicular or inguinal adenopathy Resp: clear to auscultation bilaterally and normal   percussion bilaterally Cardio: regular rate and rhythm. No gallop. GI: abdomen obese, soft, nontender, not distended, no appreciable mass or organomegaly. Normally active bowel sounds. Surgical incision not  remarkable. Musculoskeletal/ Extremities: without pitting edema, cords, tenderness Neuro: peripheral neuropathy feet as noted. Otherwise nonfocal. PSYCH appropriate mood and affect Skin without rash, ecchymosis, petechiae Portacath-without erythema or tenderness  Lab Results:  Results for orders placed or performed in visit on 03/26/15  CBC with Differential  Result Value Ref Range   WBC 3.7 (L) 3.9 - 10.3 10e3/uL   NEUT# 2.2 1.5 - 6.5 10e3/uL   HGB 11.7 11.6 - 15.9 g/dL   HCT 34.8 34.8 - 46.6 %   Platelets 211 145 - 400 10e3/uL   MCV 89.2 79.5 - 101.0 fL   MCH 30.1 25.1 - 34.0 pg   MCHC 33.7 31.5 - 36.0 g/dL   RBC 3.90 3.70 - 5.45 10e6/uL   RDW 17.0 (H) 11.2 - 14.5 %   lymph# 1.3 0.9 - 3.3 10e3/uL   MONO# 0.1 0.1 - 0.9 10e3/uL   Eosinophils Absolute 0.0 0.0 - 0.5 10e3/uL   Basophils Absolute 0.0 0.0 - 0.1 10e3/uL   NEUT% 61.0 38.4 - 76.8 %   LYMPH% 35.0 14.0 - 49.7 %   MONO% 1.9 0.0 - 14.0 %   EOS% 1.2 0.0 - 7.0 %   BASO% 0.9 0.0 - 2.0 %  Comprehensive metabolic panel (Cmet) - [facility]  Result Value Ref Range   Sodium 138 136 - 145 mEq/L   Potassium 3.6 3.5 - 5.1 mEq/L   Chloride 104 98 - 109 mEq/L   CO2 25 22 - 29 mEq/L   Glucose 135 70 - 140 mg/dl   BUN 14.2 7.0 - 26.0 mg/dL   Creatinine 0.8 0.6 - 1.1 mg/dL   Total Bilirubin 1.04 0.20 - 1.20 mg/dL   Alkaline Phosphatase 50 40 - 150 U/L   AST 27 5 - 34 U/L   ALT 38 0 - 55 U/L   Total Protein 6.7 6.4 - 8.3 g/dL   Albumin 3.7 3.5 - 5.0 g/dL   Calcium 9.4 8.4 - 10.4 mg/dL   Anion Gap 9 3 - 11 mEq/L   EGFR 90 (L) >90 ml/min/1.73 m2  CA 125  Result Value Ref Range   CA 125 32 <35 U/mL     Studies/Results:  No results found.  Medications: I have reviewed the patient's current medications. Add proctofoam HC  DISCUSSION: follow up scans and with Dr Thurston Pounds discussed; she prefers scans in [place]. I have spoken with [facility] gyn oncology clinic directly now and set up visit back to Dr Thurston Pounds 04-21-15 @ 1000. I have  requested CT AP at [facility] the week prior.   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 CA 125 in normal range today. For repeat CTs and follow up with Dr Thurston Pounds in Dec. I will see her back with labs and PAC flush in Jan. 2. Chemo neutropenia necessitating gCSF, including with most recent treatment 3.PAC in. No findings of concern on exam today. Will keep in for now, flush every 6-8 weeks when not otherwise used. Should be used for upcoming CT 4. 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 Continues gabapentin 5.intermittent discomfort in groins: etiology not clear, but could be hip related. Follow 6.surgical menopause: hot flashes still bothersome  at times despite celexa and gabapentin, gyn oncologist prefers no estrogen. . 7. Taxol aches: managed with dilaudid and tramadol.  8.hypothyroidism: to follow up with PCP, on synthroid 9.morbid obesity, BMI 45. Had begun to lose weight biking prior to this diagnosis, but has not been able to continue exercise program (biking) with chemotherapy 10.hx migraines, no problem with zofran  11.anaphylaxis to avelox, rash to sulfa. Tolerates keflex without difficulty 12.possible slight lymphedema LE related to pelvic surgery. May need referral to lymphedema PT. Has used occasional low dose HCTZ 13. Epistaxis: platelets in good range. Increase saline nose spray to hourly while awake, do not dislodge clots, can use Afrin on occasional basis for active bleeding, call ENT if heavy or ongoing bleeding. 14.fatigue with chemo, neutropenia, sleep problems.  15.hemorrhoids: sitz baths, avoid constipation, proctofoam 16.Epigastric tenderness: improved with addition of carafate. The GERD may have worsened chemo nausea additionally. EMEND helpful with nausea, continue. PCP may need to follow  up; I do not see H.pylori testing 17.. Degenerative arthritis right knee, known to Dr Norris 18. flu vaccine 03-26-15   All questions answered and patient knows to call if concerns prior to next scheduled visit. Time spent 25 min including >50% counseling and coordination of care. [facility]c Dr Van Le  LIVESAY,LENNIS P, MD   03/26/2015, 8:08 PM    

## 2015-03-26 NOTE — Telephone Encounter (Signed)
Appointments made and avs printed for patient °

## 2015-03-27 ENCOUNTER — Telehealth: Payer: Self-pay | Admitting: *Deleted

## 2015-03-27 ENCOUNTER — Ambulatory Visit (HOSPITAL_BASED_OUTPATIENT_CLINIC_OR_DEPARTMENT_OTHER): Payer: BLUE CROSS/BLUE SHIELD

## 2015-03-27 ENCOUNTER — Encounter: Payer: Self-pay | Admitting: Oncology

## 2015-03-27 VITALS — BP 128/52 | HR 102 | Temp 98.6°F

## 2015-03-27 DIAGNOSIS — C561 Malignant neoplasm of right ovary: Secondary | ICD-10-CM

## 2015-03-27 DIAGNOSIS — C563 Malignant neoplasm of bilateral ovaries: Secondary | ICD-10-CM

## 2015-03-27 DIAGNOSIS — D701 Agranulocytosis secondary to cancer chemotherapy: Secondary | ICD-10-CM | POA: Diagnosis not present

## 2015-03-27 DIAGNOSIS — C562 Malignant neoplasm of left ovary: Principal | ICD-10-CM

## 2015-03-27 MED ORDER — TBO-FILGRASTIM 480 MCG/0.8ML ~~LOC~~ SOSY
480.0000 ug | PREFILLED_SYRINGE | Freq: Once | SUBCUTANEOUS | Status: AC
  Administered 2015-03-27: 480 ug via SUBCUTANEOUS
  Filled 2015-03-27: qty 0.8

## 2015-03-27 NOTE — Progress Notes (Signed)
[place] END OF TREATMENT   Name: Josselyne Liechty Date: March 27, 2015  MRN: [id number] DOB: 01-11-1971   TREATMENT DATES: 12-05-14 thru 03-20-15   REFERRING PHYSICIAN: Margaretmary Bayley  DIAGNOSIS: grade 3 clear cell carcinoma of bilateral ovaries   STAGE AT START OF TREATMENT: IB  INTENT: curative   DRUGS OR REGIMENS GIVEN: carboplatin taxol   MAJOR TOXICITIES: neutropenia, peripheral neuropathy, fatigue, nausea   REASON TREATMENT STOPPED: completion of planned course   PERFORMANCE STATUS AT END: 1  ONGOING PROBLEMS: peripheral neuropathy, fatigue   FOLLOW UP PLANS: repeat CT and follow up with gyn oncology and medical oncology

## 2015-03-27 NOTE — Telephone Encounter (Signed)
LM with note below. To call if has questions 

## 2015-03-27 NOTE — Telephone Encounter (Signed)
-----   Message from Gordy Levan, MD sent at 03/27/2015  8:04 AM EST ----- Labs seen and need follow up please let her know CA 125 now 32. I will let Dr Thurston Pounds know also

## 2015-04-08 ENCOUNTER — Telehealth: Payer: Self-pay | Admitting: *Deleted

## 2015-04-08 NOTE — Telephone Encounter (Signed)
Called patient regarding f/u CT scan scheduled for 04/20/15 at 7am at [facility]. Reviewed instructions with patient including NPO 4 hours prior to scan and to drink 1 bottle of contrast at 5am and the second bottle at 6am. She does not have the contrast at home yet but will come to [facility] this week or next to pick it up. Told patient to use her EMLA cream on her PAC that morning and to be sure that radiology uses her [facility] for scan - patient agreeable to this. Also, instructed patient to ask for a disc of the images while she is there for the scan to take with her to Dr. Drucie Ip appt the following day at [facility] - patient verbalizes understanding.   While on the phone, I reviewed appts for lab, flush, and MD visit 06/01/15. Patient agreeable to appts as scheduled and instructed to call [facility] if any problems arise prior to these appts.

## 2015-04-15 ENCOUNTER — Telehealth: Payer: Self-pay | Admitting: Oncology

## 2015-04-15 NOTE — Telephone Encounter (Signed)
FAXED REQUESTED INFO TO DR Christene Slates [phone] RELEASE ID)

## 2015-04-16 ENCOUNTER — Other Ambulatory Visit: Payer: BLUE CROSS/BLUE SHIELD

## 2015-04-16 ENCOUNTER — Ambulatory Visit: Payer: BLUE CROSS/BLUE SHIELD | Admitting: Oncology

## 2015-04-20 ENCOUNTER — Telehealth: Payer: Self-pay

## 2015-04-20 ENCOUNTER — Encounter (HOSPITAL_COMMUNITY): Payer: Self-pay

## 2015-04-20 ENCOUNTER — Ambulatory Visit (HOSPITAL_COMMUNITY)
Admission: RE | Admit: 2015-04-20 | Discharge: 2015-04-20 | Disposition: A | Discharge status: Home / Self Care | Payer: BLUE CROSS/BLUE SHIELD | Source: Ambulatory Visit | Attending: Oncology | Admitting: Oncology

## 2015-04-20 DIAGNOSIS — C563 Malignant neoplasm of bilateral ovaries: Secondary | ICD-10-CM

## 2015-04-20 DIAGNOSIS — Z9221 Personal history of antineoplastic chemotherapy: Secondary | ICD-10-CM | POA: Diagnosis not present

## 2015-04-20 DIAGNOSIS — C562 Malignant neoplasm of left ovary: Secondary | ICD-10-CM

## 2015-04-20 DIAGNOSIS — Z90721 Acquired absence of ovaries, unilateral: Secondary | ICD-10-CM | POA: Insufficient documentation

## 2015-04-20 DIAGNOSIS — Z90710 Acquired absence of both cervix and uterus: Secondary | ICD-10-CM | POA: Diagnosis not present

## 2015-04-20 DIAGNOSIS — C569 Malignant neoplasm of unspecified ovary: Secondary | ICD-10-CM | POA: Diagnosis not present

## 2015-04-20 DIAGNOSIS — C561 Malignant neoplasm of right ovary: Secondary | ICD-10-CM

## 2015-04-20 MED ORDER — IOHEXOL 300 MG/ML  SOLN
100.0000 mL | Freq: Once | INTRAMUSCULAR | Status: AC | PRN
  Administered 2015-04-20: 100 mL via INTRAVENOUS

## 2015-04-20 NOTE — Telephone Encounter (Signed)
Faxed CT results to Dr Thurston Pounds. Contacted pt and she does have a disc of her CT to take with her to Dr Thurston Pounds

## 2015-04-22 ENCOUNTER — Telehealth: Payer: Self-pay

## 2015-04-22 NOTE — Telephone Encounter (Signed)
Left a message in her voice mail regarding the information noted below by Dr. Marko Plume. [name] verified with patient on 04-20-15  that she had a copy of CT on a disc for Dr. Thurston Pounds.  [name] also faxed a copy of the report to Dr. Thurston Pounds on 04-20-15.

## 2015-04-22 NOTE — Telephone Encounter (Signed)
-----   Message from Gordy Levan, MD sent at 04/21/2015 11:32 AM EST ----- Labs seen and need follow up  Please let her know CT report sounds good, with nothing that radiologist can tell is a problem from the cancer. Need to make sure she takes report and disc to Dr Thurston Pounds - I am not sure of date for that apt

## 2015-05-05 ENCOUNTER — Telehealth: Payer: Self-pay

## 2015-05-05 NOTE — Telephone Encounter (Signed)
[name] wanted to know if she needed to see her PCP or Dr. Marko Plume for Head congestion, joint aches, low energy, and swollen neck glands.  She finished her treatments in november. Told [name] that Dr. Marko Plume said to call PCP regarding her symptoms. [name] is agreeable to this plan.

## 2015-05-07 ENCOUNTER — Emergency Department (HOSPITAL_COMMUNITY)
Admission: EM | Admit: 2015-05-07 | Discharge: 2015-05-07 | Disposition: A | Discharge status: Home / Self Care | Payer: BLUE CROSS/BLUE SHIELD | Attending: Emergency Medicine | Admitting: Emergency Medicine

## 2015-05-07 ENCOUNTER — Emergency Department (HOSPITAL_COMMUNITY): Payer: BLUE CROSS/BLUE SHIELD

## 2015-05-07 DIAGNOSIS — Z8742 Personal history of other diseases of the female genital tract: Secondary | ICD-10-CM | POA: Diagnosis not present

## 2015-05-07 DIAGNOSIS — Z79899 Other long term (current) drug therapy: Secondary | ICD-10-CM | POA: Insufficient documentation

## 2015-05-07 DIAGNOSIS — Z8543 Personal history of malignant neoplasm of ovary: Secondary | ICD-10-CM | POA: Diagnosis not present

## 2015-05-07 DIAGNOSIS — J069 Acute upper respiratory infection, unspecified: Secondary | ICD-10-CM | POA: Diagnosis not present

## 2015-05-07 DIAGNOSIS — E079 Disorder of thyroid, unspecified: Secondary | ICD-10-CM | POA: Diagnosis not present

## 2015-05-07 DIAGNOSIS — Z792 Long term (current) use of antibiotics: Secondary | ICD-10-CM | POA: Diagnosis not present

## 2015-05-07 DIAGNOSIS — R05 Cough: Secondary | ICD-10-CM | POA: Diagnosis present

## 2015-05-07 DIAGNOSIS — F419 Anxiety disorder, unspecified: Secondary | ICD-10-CM | POA: Diagnosis not present

## 2015-05-07 DIAGNOSIS — B9789 Other viral agents as the cause of diseases classified elsewhere: Secondary | ICD-10-CM

## 2015-05-07 LAB — CBC WITH DIFFERENTIAL/PLATELET
Basophils Absolute: 0 10*3/uL (ref 0.0–0.1)
Basophils Relative: 0 %
Eosinophils Absolute: 0.3 10*3/uL (ref 0.0–0.7)
Eosinophils Relative: 2 %
HCT: 39.4 % (ref 36.0–46.0)
Hemoglobin: 13.4 g/dL (ref 12.0–15.0)
Lymphocytes Relative: 15 %
Lymphs Abs: 2.2 10*3/uL (ref 0.7–4.0)
MCH: 30.9 pg (ref 26.0–34.0)
MCHC: 34 g/dL (ref 30.0–36.0)
MCV: 91 fL (ref 78.0–100.0)
Monocytes Absolute: 1.1 10*3/uL — ABNORMAL HIGH (ref 0.1–1.0)
Monocytes Relative: 7 %
Neutro Abs: 10.9 10*3/uL — ABNORMAL HIGH (ref 1.7–7.7)
Neutrophils Relative %: 76 %
Platelets: 288 10*3/uL (ref 150–400)
RBC: 4.33 MIL/uL (ref 3.87–5.11)
RDW: 13.6 % (ref 11.5–15.5)
WBC: 14.4 10*3/uL — ABNORMAL HIGH (ref 4.0–10.5)

## 2015-05-07 LAB — COMPREHENSIVE METABOLIC PANEL
ALBUMIN: 3.9 g/dL (ref 3.5–5.0)
ALT: 23 U/L (ref 14–54)
ANION GAP: 10 (ref 5–15)
AST: 21 U/L (ref 15–41)
Alkaline Phosphatase: 65 U/L (ref 38–126)
BILIRUBIN TOTAL: 0.9 mg/dL (ref 0.3–1.2)
BUN: 7 mg/dL (ref 6–20)
CHLORIDE: 103 mmol/L (ref 101–111)
CO2: 28 mmol/L (ref 22–32)
Calcium: 9.2 mg/dL (ref 8.9–10.3)
Creatinine, Ser: 0.73 mg/dL (ref 0.44–1.00)
GFR calc Af Amer: >60 mL/min (ref >60)
GFR calc non Af Amer: >60 mL/min (ref >60)
GLUCOSE: 107 mg/dL — AB (ref 65–99)
POTASSIUM: 3.6 mmol/L (ref 3.5–5.1)
Sodium: 141 mmol/L (ref 135–145)
TOTAL PROTEIN: 7.4 g/dL (ref 6.5–8.1)

## 2015-05-07 LAB — URINE MICROSCOPIC-ADD ON

## 2015-05-07 LAB — URINALYSIS, ROUTINE W REFLEX MICROSCOPIC
Bilirubin Urine: NEGATIVE
GLUCOSE, UA: NEGATIVE mg/dL
Hgb urine dipstick: NEGATIVE
Ketones, ur: NEGATIVE mg/dL
Nitrite: NEGATIVE
PROTEIN: NEGATIVE mg/dL
SPECIFIC GRAVITY, URINE: 1.009 (ref 1.005–1.030)
pH: 6.5 (ref 5.0–8.0)

## 2015-05-07 LAB — I-STAT CG4 LACTIC ACID, ED: LACTIC ACID, VENOUS: 1.05 mmol/L (ref 0.5–2.0)

## 2015-05-07 LAB — INFLUENZA PANEL BY PCR (TYPE A & B)
H1N1 flu by pcr: NOT DETECTED
Influenza A By PCR: NEGATIVE
Influenza B By PCR: NEGATIVE

## 2015-05-07 MED ORDER — ACETAMINOPHEN 325 MG PO TABS
650.0000 mg | ORAL_TABLET | Freq: Once | ORAL | Status: AC
  Administered 2015-05-07: 650 mg via ORAL
  Filled 2015-05-07: qty 2

## 2015-05-07 MED ORDER — SODIUM CHLORIDE 0.9 % IV BOLUS (SEPSIS)
500.0000 mL | Freq: Once | INTRAVENOUS | Status: AC
  Administered 2015-05-07: 500 mL via INTRAVENOUS

## 2015-05-07 MED ORDER — LIDOCAINE-PRILOCAINE 2.5-2.5 % EX CREA
TOPICAL_CREAM | Freq: Once | CUTANEOUS | Status: AC
  Administered 2015-05-07: 11:00:00 via TOPICAL
  Filled 2015-05-07: qty 5

## 2015-05-07 MED ORDER — IPRATROPIUM-ALBUTEROL 0.5-2.5 (3) MG/3ML IN SOLN
3.0000 mL | Freq: Once | RESPIRATORY_TRACT | Status: AC
  Administered 2015-05-07: 3 mL via RESPIRATORY_TRACT
  Filled 2015-05-07: qty 3

## 2015-05-07 MED ORDER — DEXTROSE 5 % IV SOLN
1.0000 g | INTRAVENOUS | Status: DC
  Administered 2015-05-07: 1 g via INTRAVENOUS
  Filled 2015-05-07: qty 10

## 2015-05-07 MED ORDER — SODIUM CHLORIDE 0.9 % IV BOLUS (SEPSIS)
1000.0000 mL | Freq: Once | INTRAVENOUS | Status: AC
  Administered 2015-05-07: 1000 mL via INTRAVENOUS

## 2015-05-07 NOTE — Discharge Instructions (Signed)
Upper Respiratory Infection, Adult Most upper respiratory infections (URIs) are a viral infection of the air passages leading to the lungs. A URI affects the nose, throat, and upper air passages. The most common type of URI is nasopharyngitis and is typically referred to as "the common cold." URIs run their course and usually go away on their own. Most of the time, a URI does not require medical attention, but sometimes a bacterial infection in the upper airways can follow a viral infection. This is called a secondary infection. Sinus and middle ear infections are common types of secondary upper respiratory infections. Bacterial pneumonia can also complicate a URI. A URI can worsen asthma and chronic obstructive pulmonary disease (COPD). Sometimes, these complications can require emergency medical care and may be life threatening.  CAUSES Almost all URIs are caused by viruses. A virus is a type of germ and can spread from one person to another.  RISKS FACTORS You may be at risk for a URI if:   You smoke.   You have chronic heart or lung disease.  You have a weakened defense (immune) system.   You are very young or very old.   You have nasal allergies or asthma.  You work in crowded or poorly ventilated areas.  You work in health care facilities or schools. SIGNS AND SYMPTOMS  Symptoms typically develop 2-3 days after you come in contact with a cold virus. Most viral URIs last 7-10 days. However, viral URIs from the influenza virus (flu virus) can last 14-18 days and are typically more severe. Symptoms may include:   Runny or stuffy (congested) nose.   Sneezing.   Cough.   Sore throat.   Headache.   Fatigue.   Fever.   Loss of appetite.   Pain in your forehead, behind your eyes, and over your cheekbones (sinus pain).  Muscle aches.  DIAGNOSIS  Your health care provider may diagnose a URI by:  Physical exam.  Tests to check that your symptoms are not due to  another condition such as:  Strep throat.  Sinusitis.  Pneumonia.  Asthma. TREATMENT  A URI goes away on its own with time. It cannot be cured with medicines, but medicines may be prescribed or recommended to relieve symptoms. Medicines may help:  Reduce your fever.  Reduce your cough.  Relieve nasal congestion. HOME CARE INSTRUCTIONS   Take medicines only as directed by your health care provider.   Gargle warm saltwater or take cough drops to comfort your throat as directed by your health care provider.  Use a warm mist humidifier or inhale steam from a shower to increase air moisture. This may make it easier to breathe.  Drink enough fluid to keep your urine clear or pale yellow.   Eat soups and other clear broths and maintain good nutrition.   Rest as needed.   Return to work when your temperature has returned to normal or as your health care provider advises. You may need to stay home longer to avoid infecting others. You can also use a face mask and careful hand washing to prevent spread of the virus.  Increase the usage of your inhaler if you have asthma.   Do not use any tobacco products, including cigarettes, chewing tobacco, or electronic cigarettes. If you need help quitting, ask your health care provider. PREVENTION  The best way to protect yourself from getting a cold is to practice good hygiene.   Avoid oral or hand contact with people with cold  symptoms.   Wash your hands often if contact occurs.  There is no clear evidence that vitamin C, vitamin E, echinacea, or exercise reduces the chance of developing a cold. However, it is always recommended to get plenty of rest, exercise, and practice good nutrition.  SEEK MEDICAL CARE IF:   You are getting worse rather than better.   Your symptoms are not controlled by medicine.   You have chills.  You have worsening shortness of breath.  You have brown or red mucus.  You have yellow or brown nasal  discharge.  You have pain in your face, especially when you bend forward.  You have a fever.  You have swollen neck glands.  You have pain while swallowing.  You have white areas in the back of your throat. SEEK IMMEDIATE MEDICAL CARE IF:   You have severe or persistent:  Headache.  Ear pain.  Sinus pain.  Chest pain.  You have chronic lung disease and any of the following:  Wheezing.  Prolonged cough.  Coughing up blood.  A change in your usual mucus.  You have a stiff neck.  You have changes in your:  Vision.  Hearing.  Thinking.  Mood. MAKE SURE YOU:   Understand these instructions.  Will watch your condition.  Will get help right away if you are not doing well or get worse.   This information is not intended to replace advice given to you by your health care provider. Make sure you discuss any questions you have with your health care provider.   Document Released: 10/19/2000 Document Revised: 09/09/2014 Document Reviewed: 07/31/2013 Elsevier Interactive Patient Education 2016 [employer].   Please read attached information. If you experience any new or worsening signs or symptoms please return to the emergency room for evaluation. Please follow-up with your primary care provider or specialist as discussed. Please use medication prescribed only as directed and discontinue taking if you have any concerning signs or symptoms.

## 2015-05-07 NOTE — ED Provider Notes (Signed)
CSN: [id number]     Arrival date & time 05/07/15  1018 History   First MD Initiated Contact with Patient 05/07/15 1049     Chief Complaint  Patient presents with  . 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 Patient reports that one week ago she had a pain to her left ear that. Several days after. She notes that 3 days ago she experienced acute onset of fatigue. She reports that lasted for a day followed by congestion one day later, "itchiness" in her chest, with congestion. She reports yesterday she developed a productive cough and has had congestion since. Patient reports that she had a leftover prescription for Ceftin from a bladder infection and has taken 4 pills of that with the last dose last night. She reports she used ibuprofen last night as well. Patient reports nasal congestion/rhinorrhea, watery eyes, cough and chest congestion. She notes emesis after severe coughing fit but denies any nausea or vomiting other than that. She denies any chest pain, abdominal pain, diarrhea, rash, fever, sore throat. Patient has been able tolerate by mouth.  Patient has a history of ovarian cancer, completed her last dose of chemotherapy 6 weeks ago and has been feeling well ever since. Patient had a follow-up CT scan and was cleared for surveillance.   Past Medical History  Diagnosis Date  . Anxiety   . Endometriosis   . Ovarian cancer, bilateral ([place]) 11/28/2014  . Thyroid disease    Past Surgical History  Procedure Laterality Date  . Abdominal hysterectomy  11/10/14    at [facility], Exp lap, supracervical hyst, BSO, infracolic omentectomy, lymphadenectomy, aortic lymph node sampling   Family History  Problem Relation Age of Onset  . Hypertension Mother   . Diabetes Mother   . Atrial fibrillation Father   . Diabetes Maternal Aunt   . Heart failure Maternal Grandmother   .  Heart attack Maternal Grandfather   . Dementia Paternal Grandmother   . Dementia Paternal Grandfather    Social History  Substance Use Topics  . Smoking status: Never Smoker   . Smokeless tobacco: Not on file  . Alcohol Use: Yes     Comment: occassionally   OB History    No data available     Review of Systems  All other systems reviewed and are negative.   Allergies  Moxifloxacin; Quinolones; Doxycycline; Escitalopram oxalate; and Sulfonamide derivatives  Home Medications   Prior to Admission medications   Medication Sig Start Date End Date Taking? Authorizing Provider  aspirin-acetaminophen-caffeine (EXCEDRIN MIGRAINE) [phone] MG per tablet Take 2 tablets by mouth 2 (two) times daily as needed for headache or migraine.   Yes Historical Provider, MD  cefUROXime (CEFTIN) 250 MG tablet Take 250 mg by mouth 4 (four) times daily - after meals and at bedtime.   Yes Historical Provider, MD  citalopram (CELEXA) 40 MG tablet Take 1 tablet (40 mg total) by mouth daily. 01/02/14  Yes Biagio Borg, MD  Cyanocobalamin (VITAMIN B-12 PO) Take 2 tablets by mouth daily.   Yes Historical Provider, MD  diclofenac sodium (VOLTAREN) 1 % GEL Use topically four times per day as needed for pain 06/24/14  Yes Biagio Borg, MD  gabapentin (NEURONTIN) 300 MG capsule Take 300 mg by mouth 3 (three) times daily.  11/13/14 11/13/15 Yes Historical Provider, MD  hydrochlorothiazide (MICROZIDE) 12.5 MG  capsule Take 1 capsule PO QD PRN only. Patient taking differently: Take 12.5 mg by mouth daily as needed (fluid).  03/16/15  Yes Susanne Borders, NP  HYDROmorphone (DILAUDID) 4 MG tablet Take 1/2 to 1 whole tab (2 mg to 4 mg) Q 4-6 hours PRN severe pain. Patient taking differently: Take 2-4 mg by mouth every 4 (four) hours as needed for severe pain. Every 4-6 hours as needed for pain 02/26/15  Yes Lennis Marion Downer, MD  levothyroxine (LEVOTHROID) 25 MCG tablet Take 1 tablet (25 mcg total) by mouth daily before  breakfast. 09/10/14  Yes Biagio Borg, MD  lidocaine-prilocaine (EMLA) cream Apply 1-2 hrs prior to [facility] access Patient taking differently: Apply 1 application topically daily as needed (port access). Apply 1-2 hrs prior to Porta-Cath access 11/28/14  Yes Lennis P Livesay, MD  LORazepam (ATIVAN) 0.5 MG tablet Take 0.5 mg by mouth at bedtime as needed. sleep 04/21/15  Yes Historical Provider, MD  ondansetron (ZOFRAN) 8 MG tablet Take 1 tablet (8 mg total) by mouth every 8 (eight) hours as needed for nausea or vomiting. Will not make drowsy. 03/05/15  Yes Lennis Marion Downer, MD  pantoprazole (PROTONIX) 40 MG tablet Take 1 tablet twice a day for 2 weeks then 1 tablet daily. Patient taking differently: Take 40 mg by mouth daily as needed (heartburn).  03/26/15  Yes Lennis Marion Downer, MD  prochlorperazine (COMPAZINE) 10 MG tablet Take 1 tablet (10 mg total) by mouth every 6 (six) hours as needed for nausea or vomiting. 12/29/14  Yes Susanne Borders, NP  sucralfate (CARAFATE) 1 G tablet Take 1 tablet (1 g total) by mouth 4 (four) times daily -  before meals and at bedtime. 01/22/15  Yes Lennis Marion Downer, MD  triamcinolone cream (KENALOG) 0.1 % Apply 1 application topically 2 (two) times daily. Patient taking differently: Apply 1 application topically 2 (two) times daily as needed (rash/irritation).  01/02/14  Yes Biagio Borg, MD  ALPRAZolam Duanne Moron) 1 MG tablet 1/2 tab by mouth twice per day as needed 06/24/14   Biagio Borg, MD  hydrocortisone-pramoxine [facility]) rectal foam Place 1 applicator rectally 2 (two) times daily. Patient not taking: Reported on 05/07/2015 03/26/15   Lennis Marion Downer, MD  LORazepam (ATIVAN) 1 MG tablet Place 1/2 -1 tablet under the tongue or swallow every 6 hours as needed for nausea. Will make drowsy / forgetful around each dose. Patient not taking: Reported on 05/07/2015 03/05/15   Gordy Levan, MD  traMADol (ULTRAM) 50 MG tablet Take 1 tablet (50 mg total) by mouth every  6 (six) hours as needed for moderate pain. Patient not taking: Reported on 03/26/2015 02/26/15   Lennis P Livesay, MD   BP 115/77 mmHg  Pulse 133  Temp(Src) 101.4 F (38.6 C) (Oral)  Resp 18  Wt 127.007 kg  SpO2 97%  LMP 11/02/2014   Physical Exam  Constitutional: She is oriented to person, place, and time. She appears well-developed and well-nourished.  HENT:  Head: Normocephalic and atraumatic.  Nose: Rhinorrhea present. Right sinus exhibits no maxillary sinus tenderness and no frontal sinus tenderness. Left sinus exhibits no maxillary sinus tenderness and no frontal sinus tenderness.  Mouth/Throat: Uvula is midline, oropharynx is clear and moist and mucous membranes are normal. No oropharyngeal exudate, posterior oropharyngeal edema, posterior oropharyngeal erythema or tonsillar abscesses.  Eyes: Conjunctivae are normal. Pupils are equal, round, and reactive to light. Right eye exhibits no discharge. Left eye exhibits no discharge. No  scleral icterus.  Neck: Normal range of motion. No JVD present. No tracheal deviation present.  Pulmonary/Chest: Effort normal. No stridor.  Abdominal: Soft. She exhibits no distension and no mass. There is no tenderness. There is no rebound and no guarding.  Musculoskeletal: Normal range of motion. She exhibits no edema or tenderness.  Neurological: She is alert and oriented to person, place, and time. Coordination normal.  Skin: Skin is warm and dry. No rash noted. No erythema. No pallor.  Psychiatric: She has a normal mood and affect. Her behavior is normal. Judgment and thought content normal.  Nursing note and vitals reviewed.   ED Course  Procedures (including critical care time) Labs Review Labs Reviewed  COMPREHENSIVE METABOLIC PANEL - Abnormal; Notable for the following:    Glucose, Bld 107 (*)    All other components within normal limits  URINALYSIS, ROUTINE W REFLEX MICROSCOPIC (NOT AT [facility]) - Abnormal; Notable for the following:     APPearance CLOUDY (*)    Leukocytes, UA TRACE (*)    All other components within normal limits  URINE MICROSCOPIC-ADD ON - Abnormal; Notable for the following:    Squamous Epithelial / LPF 0-5 (*)    Bacteria, UA FEW (*)    All other components within normal limits  CBC WITH DIFFERENTIAL/PLATELET - Abnormal; Notable for the following:    WBC 14.4 (*)    Neutro Abs 10.9 (*)    Monocytes Absolute 1.1 (*)    All other components within normal limits  CULTURE, BLOOD (ROUTINE X 2)  CULTURE, BLOOD (ROUTINE X 2)  INFLUENZA PANEL BY PCR (TYPE A & B, H1N1)  I-STAT CG4 LACTIC ACID, ED    Imaging Review Dg Chest 2 View  05/07/2015  CLINICAL DATA:  One day history of shortness of breath and congestion. History of ovarian carcinoma EXAM: CHEST  2 VIEW COMPARISON:  November 11, 2008 FINDINGS: Port-A-Cath tip is in the superior vena cava. No pneumothorax. There is no edema or consolidation. Heart size and pulmonary vascularity are normal. No adenopathy. No bone lesions. IMPRESSION: No edema or consolidation. Port-A-Cath tip in superior vena cava. No pneumothorax. No adenopathy apparent. Electronically Signed   By: Lowella Grip III M.D.   On: 05/07/2015 12:18   I have personally reviewed and evaluated these images and lab results as part of my medical decision-making.   EKG Interpretation None      MDM   Final diagnoses:  Viral URI with cough    Labs: Influenza, i-STAT lactic acid, blood culture, CMP, CBC, urinalysis- WBC 14.4  Imaging:  Consults:  Therapeutics: Normal saline, Tylenol  Discharge Meds:   Assessment/Plan: 44 year old female presents with likely viral upper respiratory infection. Patient is a cancer patient, she has been cleared for surveillance, no recent therapy in the last 6 weeks, no active cancer or immunosuppression. Patient symptoms most consistent with viral, upper respiratory with productive mucus, rhinorrhea or congestion, fatigue. Patient presents to started  before the other symptoms with an acute onset, question influenza. She's been able tolerate by mouth the entire time with only a few episodes of vomiting after cough. Patient's labs reassuring here with normal lactic acid, only slight elevation in WBCs. She is oxygenating between 97-100%, with other reassuring vital signs. Patient does have a elevated heart rate today and developed a fever while here in the ED. due to patient's significant past medical history and presence of poor she'll be treated with 1 g Rocephin here, blood cultures will be drawn. Patient has  follow-up appointment with primary care provider tomorrow. She is encouraged to make this appointment. She is instructed to return to emergency room immediately if any new or worsening signs or symptoms present. I have low suspicion for a blood borne illness this patient has obvious signs of upper respiratory infection, she will be contacted if blood cultures return positive.         Okey Regal, PA-C 05/07/15 1623  Leonard Schwartz, MD 05/10/15 [phone]

## 2015-05-07 NOTE — ED Notes (Signed)
Bed: WA19 Expected date:  Expected time:  Means of arrival:  Comments: Tr 6 

## 2015-05-07 NOTE — ED Notes (Signed)
Pt reports she had ovarian cancer, last chemo 11/11, was cleared by oncologist by 12/13 by Dr Julieanne Cotton at [facility], Dr Camelia Eng is oncologist at [facility]. Pt reports ear infection 1 week ago, progressed to lethargy on Monday 12/26, pt laid in bed all day. Tuesday 12/27 scratchy throat, on Wednesday 12/28 lost voice and started coughing and having congestion. Pt reports SOB, feeling like she cant breathe since last night.  Denies pain.

## 2015-05-08 ENCOUNTER — Other Ambulatory Visit (INDEPENDENT_AMBULATORY_CARE_PROVIDER_SITE_OTHER): Payer: BLUE CROSS/BLUE SHIELD

## 2015-05-08 ENCOUNTER — Ambulatory Visit (INDEPENDENT_AMBULATORY_CARE_PROVIDER_SITE_OTHER): Payer: BLUE CROSS/BLUE SHIELD | Admitting: Internal Medicine

## 2015-05-08 ENCOUNTER — Encounter: Payer: Self-pay | Admitting: Internal Medicine

## 2015-05-08 VITALS — BP 114/90 | HR 114 | Temp 98.5°F | Ht 68.0 in | Wt 286.0 lb

## 2015-05-08 DIAGNOSIS — M2550 Pain in unspecified joint: Secondary | ICD-10-CM | POA: Diagnosis not present

## 2015-05-08 DIAGNOSIS — R059 Cough, unspecified: Secondary | ICD-10-CM

## 2015-05-08 DIAGNOSIS — E0590 Thyrotoxicosis, unspecified without thyrotoxic crisis or storm: Secondary | ICD-10-CM

## 2015-05-08 DIAGNOSIS — R062 Wheezing: Secondary | ICD-10-CM

## 2015-05-08 DIAGNOSIS — R05 Cough: Secondary | ICD-10-CM | POA: Diagnosis not present

## 2015-05-08 LAB — SEDIMENTATION RATE: Sed Rate: 51 mm/hr — ABNORMAL HIGH (ref 0–22)

## 2015-05-08 LAB — RHEUMATOID FACTOR

## 2015-05-08 MED ORDER — HYDROCODONE-HOMATROPINE 5-1.5 MG/5ML PO SYRP: 5.0000 mL | ORAL_SOLUTION | Freq: Four times a day (QID) | ORAL | Status: DC | PRN

## 2015-05-08 MED ORDER — ALBUTEROL SULFATE HFA 108 (90 BASE) MCG/ACT IN AERS: 2.0000 | INHALATION_SPRAY | Freq: Four times a day (QID) | RESPIRATORY_TRACT | Status: DC | PRN

## 2015-05-08 MED ORDER — PREDNISONE 10 MG PO TABS: ORAL_TABLET | ORAL | Status: DC

## 2015-05-08 MED ORDER — METHYLPREDNISOLONE ACETATE 80 MG/ML IJ SUSP
80.0000 mg | Freq: Once | INTRAMUSCULAR | Status: AC
  Administered 2015-05-08: 80 mg via INTRAMUSCULAR

## 2015-05-08 MED ORDER — AMOXICILLIN-POT CLAVULANATE 875-125 MG PO TABS: 1.0000 | ORAL_TABLET | Freq: Two times a day (BID) | ORAL | Status: DC

## 2015-05-08 NOTE — Assessment & Plan Note (Signed)
Mild to mod, ? Bronchitis vs pna, cxr neg yesterday but now mildly worse today, persistent tachycardic, for antibx course,  to f/u any worsening symptoms or concerns

## 2015-05-08 NOTE — Progress Notes (Signed)
Subjective:    Patient ID: [name], female    DOB: February 06, 1971, 44 y.o.   MRN: [id number]  HPI  Here with acute onset mild to mod 4-5 days ST, HA, general weakness and malaise, with prod cough, but Pt denies chest pain, increased sob or doe, wheezing, orthopnea, PND, increased LE swelling, palpitations, dizziness or syncope; seen and tx at ED yesterday, labs and cxr neg for acute, given IVF's with elev HR.  Alb neb x 1 seemed to help as well. Overnight now some worse with increased wheeze, cough now prod greenish sputum.  Pt denies new neurological symptoms such as new headache, or facial or extremity weakness or numbness   Pt denies polydipsia, polyuria.  Also mentions several months ongoing large joint stiffness and pain, mostly to hips and knees such that if she does not move every hour or so, it is very difficult to stand up.  Past Medical History  Diagnosis Date  . Anxiety   . Endometriosis   . Ovarian cancer, bilateral ([place]) 11/28/2014  . Thyroid disease    Past Surgical History  Procedure Laterality Date  . Abdominal hysterectomy  11/10/14    at [facility], Exp lap, supracervical hyst, BSO, infracolic omentectomy, lymphadenectomy, aortic lymph node sampling    reports that she has never smoked. She does not have any smokeless tobacco history on file. She reports that she drinks alcohol. She reports that she does not use illicit drugs. family history includes Atrial fibrillation in her father; Dementia in her paternal grandfather and paternal grandmother; Diabetes in her maternal aunt and mother; Heart attack in her maternal grandfather; Heart failure in her maternal grandmother; Hypertension in her mother. Allergies  Allergen Reactions  . Moxifloxacin Anaphylaxis    needs epinephrine shot  . Quinolones Anaphylaxis, Shortness Of Breath and Swelling  . Doxycycline Nausea And Vomiting  . Escitalopram Oxalate Other (See Comments)     fatigue  . Sulfonamide Derivatives Swelling and Rash     needs epinephrine shot--rash and lip swelling   Current Outpatient Prescriptions on File Prior to Visit  Medication Sig Dispense Refill  . aspirin-acetaminophen-caffeine (EXCEDRIN MIGRAINE) 250-250-65 MG per tablet Take 2 tablets by mouth 2 (two) times daily as needed for headache or migraine.    . cefUROXime (CEFTIN) 250 MG tablet Take 250 mg by mouth 4 (four) times daily - after meals and at bedtime.    . citalopram (CELEXA) 40 MG tablet Take 1 tablet (40 mg total) by mouth daily. 90 tablet 3  . Cyanocobalamin (VITAMIN B-12 PO) Take 2 tablets by mouth daily.    . diclofenac sodium (VOLTAREN) 1 % GEL Use topically four times per day as needed for pain 100 g 11  . gabapentin (NEURONTIN) 300 MG capsule Take 300 mg by mouth 3 (three) times daily.     . hydrochlorothiazide (MICROZIDE) 12.5 MG capsule Take 1 capsule PO QD PRN only. (Patient taking differently: Take 12.5 mg by mouth daily as needed (fluid). ) 20 capsule 0  . levothyroxine (LEVOTHROID) 25 MCG tablet Take 1 tablet (25 mcg total) by mouth daily before breakfast. 90 tablet 3  . lidocaine-prilocaine (EMLA) cream Apply 1-2 hrs prior to Porta-Cath access (Patient taking differently: Apply 1 application topically daily as needed (port access). Apply 1-2 hrs prior to Porta-Cath access) 30 g 1  . LORazepam (ATIVAN) 0.5 MG tablet Take 0.5 mg by mouth at bedtime as needed. sleep    . pantoprazole (PROTONIX) 40 MG tablet Take 1 tablet twice  a day for 2 weeks then 1 tablet daily. (Patient taking differently: Take 40 mg by mouth daily as needed (heartburn). ) 45 tablet 0  . sucralfate (CARAFATE) 1 G tablet Take 1 tablet (1 g total) by mouth 4 (four) times daily -  before meals and at bedtime. 120 tablet 2  . triamcinolone cream (KENALOG) 0.1 % Apply 1 application topically 2 (two) times daily. (Patient taking differently: Apply 1 application topically 2 (two) times daily as needed (rash/irritation). ) 30 g 0  . ALPRAZolam (XANAX) 1 MG tablet 1/2  tab by mouth twice per day as needed (Patient not taking: Reported on 05/08/2015) 30 tablet 5  . hydrocortisone-pramoxine (PROCTOFOAM-HC) rectal foam Place 1 applicator rectally 2 (two) times daily. (Patient not taking: Reported on 05/07/2015) 10 g 1  . HYDROmorphone (DILAUDID) 4 MG tablet Take 1/2 to 1 whole tab (2 mg to 4 mg) Q 4-6 hours PRN severe pain. (Patient not taking: Reported on 05/08/2015) 45 tablet 0  . LORazepam (ATIVAN) 1 MG tablet Place 1/2 -1 tablet under the tongue or swallow every 6 hours as needed for nausea. Will make drowsy / forgetful around each dose. (Patient not taking: Reported on 05/07/2015) 30 tablet 0  . ondansetron (ZOFRAN) 8 MG tablet Take 1 tablet (8 mg total) by mouth every 8 (eight) hours as needed for nausea or vomiting. Will not make drowsy. (Patient not taking: Reported on 05/08/2015) 30 tablet 1  . prochlorperazine (COMPAZINE) 10 MG tablet Take 1 tablet (10 mg total) by mouth every 6 (six) hours as needed for nausea or vomiting. (Patient not taking: Reported on 05/08/2015) 60 tablet 0  . traMADol (ULTRAM) 50 MG tablet Take 1 tablet (50 mg total) by mouth every 6 (six) hours as needed for moderate pain. (Patient not taking: Reported on 03/26/2015) 24 tablet 0   No current facility-administered medications on file prior to visit.   Review of Systems  Constitutional: Negative for unusual diaphoresis or night sweats HENT: Negative for ringing in ear or discharge Eyes: Negative for double vision or worsening visual disturbance.  Respiratory: Negative for choking and stridor.   Gastrointestinal: Negative for vomiting or other signifcant bowel change Genitourinary: Negative for hematuria or change in urine volume.  Musculoskeletal: Negative for other MSK pain or swelling Skin: Negative for color change and worsening wound.  Neurological: Negative for tremors and numbness other than noted  Psychiatric/Behavioral: Negative for decreased concentration or agitation  other than above       Objective:   Physical Exam BP 114/90 mmHg  Pulse 114  Temp(Src) 98.5 F (36.9 C) (Oral)  Ht 5\' 8"  (1.727 m)  Wt 286 lb (129.729 kg)  BMI 43.50 kg/m2  SpO2 97%  LMP 11/02/2014 VS noted, mild ill Constitutional: Pt appears in no significant distress HENT: Head: NCAT.  Right Ear: External ear normal.  Left Ear: External ear normal.  Bilat tm's with mild erythema.  Max sinus areas mild tender.  Pharynx with mild erythema, no exudate Eyes: . Pupils are equal, round, and reactive to light. Conjunctivae and EOM are normal Neck: Normal range of motion. Neck supple.  Cardiovascular: Normal rate and regular rhythm.   Pulmonary/Chest: Effort normal and breath sounds without rales but with few scattered bilat wheezing.  Joints: no effusion Neurological: Pt is alert. Not confused , motor grossly intact Skin: Skin is warm. No rash, no LE edema Psychiatric: Pt behavior is normal. No agitation.   Lab Results  Component Value Date   WBC 14.4*  05/07/2015   HGB 13.4 05/07/2015   HCT 39.4 05/07/2015   PLT 288 05/07/2015   GLUCOSE 107* 05/07/2015   ALT 23 05/07/2015   AST 21 05/07/2015   NA 141 05/07/2015   K 3.6 05/07/2015   CL 103 05/07/2015   CREATININE 0.73 05/07/2015   BUN 7 05/07/2015   CO2 28 05/07/2015   INR 0.99 12/04/2014   Blood cx - no growth to date CLINICAL DATA: One day history of shortness of breath and congestion. History of ovarian carcinoma  EXAM: CHEST 2 VIEW  COMPARISON: November 11, 2008  FINDINGS: Port-A-Cath tip is in the superior vena cava. No pneumothorax. There is no edema or consolidation. Heart size and pulmonary vascularity are normal. No adenopathy. No bone lesions.  IMPRESSION: No edema or consolidation. Port-A-Cath tip in superior vena cava. No pneumothorax. No adenopathy apparent.   Electronically Signed  By: Lowella Grip III M.D.  On: 05/07/2015 12:18     Assessment & Plan:

## 2015-05-08 NOTE — Addendum Note (Signed)
Addended by: Lyman Bishop on: 05/08/2015 04:55 PM   Modules accepted: Orders

## 2015-05-08 NOTE — Assessment & Plan Note (Signed)
Mild to mod, for depomedrol IM, predpac asd, alb MDI prn,  to f/u any worsening symptoms or concerns

## 2015-05-08 NOTE — Patient Instructions (Addendum)
You had the steroid shot today  Please take all new medication as prescribed - the antibiotic, cough medicine, prednisone, and inhaler as needed  Please continue all other medications as before, and refills have been done if requested.  Please have the pharmacy call with any other refills you may need.  Please keep your appointments with your specialists as you may have planned  You will be contacted regarding the referral for: Dr Tamala Julian - sports medicine (orthopedic)  Please go to the LAB in the Basement (turn left off the elevator) for the tests to be done today  You will be contacted by phone if any changes need to be made immediately.  Otherwise, you will receive a letter about your results with an explanation, but please check with MyChart first.  Please remember to sign up for MyChart if you have not done so, as this will be important to you in the future with finding out test results, communicating by private email, and scheduling acute appointments online when needed.

## 2015-05-08 NOTE — Assessment & Plan Note (Signed)
?  Chemo related vs rheum vs other - for esr, crp, RF, ANA, and refer Dr Smith/sport med

## 2015-05-08 NOTE — Progress Notes (Signed)
Pre visit review using our clinic review tool, if applicable. No additional management support is needed unless otherwise documented below in the visit note. 

## 2015-05-12 ENCOUNTER — Encounter: Payer: Self-pay | Admitting: Internal Medicine

## 2015-05-12 LAB — ANA: ANA: NEGATIVE

## 2015-05-12 LAB — CULTURE, BLOOD (ROUTINE X 2): CULTURE: NO GROWTH

## 2015-05-25 ENCOUNTER — Encounter: Payer: Self-pay | Admitting: Family Medicine

## 2015-05-25 ENCOUNTER — Ambulatory Visit (INDEPENDENT_AMBULATORY_CARE_PROVIDER_SITE_OTHER): Payer: BLUE CROSS/BLUE SHIELD | Admitting: Family Medicine

## 2015-05-25 VITALS — BP 134/84 | HR 97 | Ht 68.0 in | Wt 288.0 lb

## 2015-05-25 DIAGNOSIS — C561 Malignant neoplasm of right ovary: Secondary | ICD-10-CM

## 2015-05-25 DIAGNOSIS — C562 Malignant neoplasm of left ovary: Secondary | ICD-10-CM

## 2015-05-25 DIAGNOSIS — T451X5A Adverse effect of antineoplastic and immunosuppressive drugs, initial encounter: Secondary | ICD-10-CM

## 2015-05-25 DIAGNOSIS — M545 Low back pain, unspecified: Secondary | ICD-10-CM

## 2015-05-25 DIAGNOSIS — M2550 Pain in unspecified joint: Secondary | ICD-10-CM | POA: Diagnosis not present

## 2015-05-25 DIAGNOSIS — C563 Malignant neoplasm of bilateral ovaries: Secondary | ICD-10-CM

## 2015-05-25 DIAGNOSIS — G620 Drug-induced polyneuropathy: Secondary | ICD-10-CM

## 2015-05-25 DIAGNOSIS — Z6841 Body Mass Index (BMI) 40.0 and over, adult: Secondary | ICD-10-CM

## 2015-05-25 NOTE — Assessment & Plan Note (Signed)
Encourage weight loss. 

## 2015-05-25 NOTE — Assessment & Plan Note (Signed)
Secondary to inactivity and poor core strength. Work with Product/process development scientist to learn home exercises today. We will check again in 4 weeks for further evaluation.

## 2015-05-25 NOTE — Patient Instructions (Addendum)
Very nice to meet you This will be a bit of a balancing act but we will get there Lots of vitaminds Vitamin C 500-1000mg  daily to help with iron absorption and some other essential vitamins Consider a probiotic as well to help with absorption. First ingredient needs to be lactobacillus Vitamin D 4000 IU daily for next 2 weeks then 2000 IU daily thereafter Turmeric 500mg  daily I would consider getting labs to see if we could make some changes.  Consider black cohash 200mg  for the night sweats.  If you need any breakthrough pain relief ibuprofen or tylenol is ok but will limit if you can Ice 20 minutes 2 times daily. Usually after activity and before bed. On wall with heels, butt shoulder and head touching for a goal of 5 minutes daily  Tennis ball between shoulder blades with sitting Sleep with left arm down if you can but will take a lot of time We will see you again in 1 month and decide if we need to do any labs.

## 2015-05-25 NOTE — Assessment & Plan Note (Signed)
Patient does have some polyarthralgia. I do think that this is multifactorial. Patient is overweight, recently in the last 2 months discontinued chemotherapy, in this is likely cause some metabolic disturbances. We did discuss labs and patient would like to hold at this time. We discussed formal physical therapy and patient also wants to discuss waiting for some time. 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 We discussed icing regimen, home exercises, in which activities to do an which ones to potentially avoid. Patient work with Product/process development scientist to learn this and greater detail. We discussed over-the-counter medications to help with some of these possible disturbances resorption of her stomach as well as possible deficiencies. Patient is going to try this conservative management would like to see me again in 4 weeks. At that time if she continues to have discomfort I would consider labs including calcium level, TSH and free T4, vitamin D, uric acid, an iron panel with repeating of ESR and CRP.

## 2015-05-25 NOTE — Progress Notes (Signed)
Pre visit review using our clinic review tool, if applicable. No additional management support is needed unless otherwise documented below in the visit note. 

## 2015-05-25 NOTE — Progress Notes (Signed)
[facility] [address], [place] [address] Phone: [phone] Subjective:    I'm seeing this patient by the request  of:  [name], MD   CC:  polyarthralgia  [id number] [name] is a 45 y.o. female coming in with complaint of  Multiple joint pains. Patient does have a past medical history significant for ovarian cancer and patient is in the process of receiving chemotherapy. 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 Patient has been complaining of joint pain and was on by primary care provider was sent here for further evaluation. Patient was having a flare and was given prednisone by primary care provider. Patient states this was more low for her bronchitis.  Patient did have labs at her last follow-up at the end of December. Patient had a sedimentation rate that was elevated at 51 the patient does have cancer. ANA and rheumatoid factor negative.Patient states  She continues to have discomfort and most of her joints. Seems mostly in the knees as well as the ankles bilaterally and somewhat of her wrist. Seems to come and go but never completely resolved. Sometimes so severe that she cannot do any activity except laying in bed. Patient denies any significant depression and states that she is finding it difficult to become motivated patient also denies any improvement in the peripheral neuropathy. Patient states  Severity pain can be as high as 8 out of 10. Denies any significant swelling or redness of the skin. States though that he can wake her up at night. Does take anti-inflammatories occasionally. Course pain in the moment seems to be the upper trapezius on the left side. Seems to wake her up when she is sleeping. Low back pain also given her trouble on the right side. No numbness down the leg other than the peripheral neuropathy as stated above.     Past Medical History  Diagnosis Date  . Anxiety   .  Endometriosis   . Ovarian cancer, bilateral ([place]) 11/28/2014  . Thyroid disease    Past Surgical History  Procedure Laterality Date  . Abdominal hysterectomy  11/10/14    at [facility], Exp lap, supracervical hyst, BSO, infracolic omentectomy, lymphadenectomy, aortic lymph node sampling   Social History   Social History  . Marital Status: Divorced    Spouse Name: N/A  . Number of Children: 0  . Years of Education: N/A   Social History Main Topics  . Smoking status: Never Smoker   . Smokeless tobacco: None  . Alcohol Use: Yes     Comment: occassionally  . Drug Use: No  . Sexual Activity: Not Asked   Other Topics Concern  . None   Social History Narrative   Allergies  Allergen Reactions  . Moxifloxacin Anaphylaxis    needs epinephrine shot  . Quinolones Anaphylaxis, Shortness Of Breath and Swelling  . Doxycycline Nausea And Vomiting  . Escitalopram Oxalate Other (See Comments)     fatigue  . Sulfonamide Derivatives Swelling and Rash    needs epinephrine shot--rash and lip swelling   Family History  Problem Relation Age of Onset  . Hypertension Mother   . Diabetes Mother   . Atrial fibrillation Father   . Diabetes Maternal Aunt   . Heart failure Maternal Grandmother   . Heart attack Maternal Grandfather   . Dementia Paternal Grandmother   . Dementia Paternal Grandfather     Past medical history, social, surgical and family history all reviewed  in electronic medical record.  No pertanent information unless stated regarding to the chief complaint.   Review of Systems: No headache, visual changes, nausea, vomiting, diarrhea, constipation, dizziness, abdominal pain, skin rash, fevers, chills, night sweats, weight loss, swollen lymph nodes, body aches, joint swelling, muscle aches, chest pain, shortness of breath, mood changes.   Objective Blood pressure 134/84, pulse 97, height 5\' 8"  (1.727 m), weight 288 lb (130.636 kg), last menstrual period 11/02/2014, SpO2 98 %.    General: No apparent distress alert and oriented x3 mood and affect normal, dressed appropriately.  HEENT: Pupils equal, extraocular movements intact  Respiratory: Patient's speak in full sentences and does not appear short of breath  Cardiovascular: No lower extremity edema, non tender, no erythema  Skin: Warm dry intact with no signs of infection or rash on extremities or on axial skeleton.  Abdomen: Soft nontender  Neuro: Cranial nerves II through XII are intact, neurovascularly intact in all extremities with 2+ DTRs and 2+ pulses.  Lymph: No lymphadenopathy of posterior or anterior cervical chain or axillae bilaterally.  Gait normal with good balance and coordination.  MSK:  Non tender with full range of motion and good stability and symmetric strength and tone of shoulders, elbows, wrist, hip, knee and ankles bilaterally.  Neck: Inspection unremarkable. No palpable stepoffs. Negative Spurling's maneuver. Full neck range of motion tightness on the left trapezius muscle Grip strength and sensation normal in bilateral hands Strength good C4 to T1 distribution No sensory change to C4 to T1 Negative Hoffman sign bilaterally Reflexes normal Back Exam:  Inspection: Unremarkable poor core strength Motion: Flexion 35 deg, Extension 15 deg, Side Bending to 25 deg bilaterally,  Rotation to 30 deg bilaterally  SLR laying: Negative  XSLR laying: Negative  Palpable tenderness: tender to palpation of the paraspinal musculature of the lumbar spine mostly on the right side.Marland Kitchen FABER: negative. Sensory change: Gross sensation intact to all lumbar and sacral dermatomes.  Reflexes: 2+ at both patellar tendons, 2+ at achilles tendons, Babinski's downgoing.  Strength at foot  Plantar-flexion: 5/5 Dorsi-flexion: 5/5 Eversion: 5/5 Inversion: 5/5  Leg strength  Quad: 5/5 Hamstring: 5/5 Hip flexor: 5/5 Hip abductors: 5/5  Gait unremarkable.  Procedure note [id number]; 15 minutes spent for Therapeutic  exercises as stated in above notes.  This included exercises focusing on stretching, strengthening, with significant focus on eccentric aspects. Pelvic tilt/bracing instruction to focus on control of the pelvic girdle and lower abdominal muscles  Glute strengthening exercises, focusing on proper firing of the glutes without engaging the low back muscles Proper stretching techniques for maximum relief for the hamstrings, hip flexors, low back and some rotation where tolerated  Proper technique shown and discussed handout in great detail with ATC.  All questions were discussed and answered.     Impression and Recommendations:     This case required medical decision making of moderate complexity.      Note: This dictation was prepared with Dragon dictation along with smaller phrase technology. Any transcriptional errors that result from this process are unintentional.

## 2015-05-25 NOTE — Assessment & Plan Note (Signed)
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

## 2015-05-26 ENCOUNTER — Encounter: Payer: Self-pay | Admitting: Internal Medicine

## 2015-05-31 ENCOUNTER — Other Ambulatory Visit: Payer: Self-pay | Admitting: Oncology

## 2015-05-31 DIAGNOSIS — C563 Malignant neoplasm of bilateral ovaries: Secondary | ICD-10-CM

## 2015-05-31 DIAGNOSIS — C561 Malignant neoplasm of right ovary: Secondary | ICD-10-CM

## 2015-05-31 DIAGNOSIS — C562 Malignant neoplasm of left ovary: Principal | ICD-10-CM

## 2015-06-01 ENCOUNTER — Encounter: Payer: Self-pay | Admitting: Internal Medicine

## 2015-06-01 ENCOUNTER — Ambulatory Visit: Payer: BLUE CROSS/BLUE SHIELD | Admitting: Oncology

## 2015-06-01 ENCOUNTER — Encounter: Payer: Self-pay | Admitting: Oncology

## 2015-06-01 ENCOUNTER — Other Ambulatory Visit (HOSPITAL_BASED_OUTPATIENT_CLINIC_OR_DEPARTMENT_OTHER): Payer: BLUE CROSS/BLUE SHIELD

## 2015-06-01 ENCOUNTER — Other Ambulatory Visit (HOSPITAL_BASED_OUTPATIENT_CLINIC_OR_DEPARTMENT_OTHER): Payer: BLUE CROSS/BLUE SHIELD | Admitting: *Deleted

## 2015-06-01 ENCOUNTER — Telehealth: Payer: Self-pay | Admitting: Oncology

## 2015-06-01 ENCOUNTER — Ambulatory Visit (HOSPITAL_BASED_OUTPATIENT_CLINIC_OR_DEPARTMENT_OTHER): Payer: BLUE CROSS/BLUE SHIELD

## 2015-06-01 ENCOUNTER — Encounter: Payer: Self-pay | Admitting: Family Medicine

## 2015-06-01 VITALS — BP 134/72 | HR 81 | Temp 98.0°F | Resp 18 | Ht 68.0 in | Wt 286.5 lb

## 2015-06-01 DIAGNOSIS — Z95828 Presence of other vascular implants and grafts: Secondary | ICD-10-CM

## 2015-06-01 DIAGNOSIS — C561 Malignant neoplasm of right ovary: Secondary | ICD-10-CM

## 2015-06-01 DIAGNOSIS — C562 Malignant neoplasm of left ovary: Principal | ICD-10-CM

## 2015-06-01 DIAGNOSIS — M2550 Pain in unspecified joint: Secondary | ICD-10-CM

## 2015-06-01 DIAGNOSIS — G620 Drug-induced polyneuropathy: Secondary | ICD-10-CM

## 2015-06-01 DIAGNOSIS — E039 Hypothyroidism, unspecified: Secondary | ICD-10-CM

## 2015-06-01 DIAGNOSIS — C563 Malignant neoplasm of bilateral ovaries: Secondary | ICD-10-CM

## 2015-06-01 DIAGNOSIS — Z6841 Body Mass Index (BMI) 40.0 and over, adult: Secondary | ICD-10-CM

## 2015-06-01 LAB — COMPREHENSIVE METABOLIC PANEL
ALK PHOS: 60 U/L (ref 40–150)
ALT: 18 U/L (ref 0–55)
ANION GAP: 7 meq/L (ref 3–11)
AST: 16 U/L (ref 5–34)
Albumin: 3.7 g/dL (ref 3.5–5.0)
BILIRUBIN TOTAL: 1.26 mg/dL — AB (ref 0.20–1.20)
BUN: 12.3 mg/dL (ref 7.0–26.0)
CALCIUM: 9.5 mg/dL (ref 8.4–10.4)
CO2: 27 meq/L (ref 22–29)
CREATININE: 0.8 mg/dL (ref 0.6–1.1)
Chloride: 104 mEq/L (ref 98–109)
EGFR: >90 mL/min/{1.73_m2} (ref >90)
Glucose: 85 mg/dl (ref 70–140)
Potassium: 3.8 mEq/L (ref 3.5–5.1)
Sodium: 139 mEq/L (ref 136–145)
Total Protein: 7.1 g/dL (ref 6.4–8.3)

## 2015-06-01 LAB — CBC WITH DIFFERENTIAL/PLATELET
BASO%: 0.6 % (ref 0.0–2.0)
BASOS ABS: 0.1 10*3/uL (ref 0.0–0.1)
EOS%: 1.3 % (ref 0.0–7.0)
Eosinophils Absolute: 0.1 10*3/uL (ref 0.0–0.5)
HCT: 38 % (ref 34.8–46.6)
HGB: 12.8 g/dL (ref 11.6–15.9)
LYMPH%: 25.9 % (ref 14.0–49.7)
MCH: 30.4 pg (ref 25.1–34.0)
MCHC: 33.7 g/dL (ref 31.5–36.0)
MCV: 90.2 fL (ref 79.5–101.0)
MONO#: 0.5 10*3/uL (ref 0.1–0.9)
MONO%: 6.1 % (ref 0.0–14.0)
NEUT#: 5.8 10*3/uL (ref 1.5–6.5)
NEUT%: 66.1 % (ref 38.4–76.8)
Platelets: 273 10*3/uL (ref 145–400)
RBC: 4.21 10*6/uL (ref 3.70–5.45)
RDW: 14.5 % (ref 11.2–14.5)
WBC: 8.8 10*3/uL (ref 3.9–10.3)
lymph#: 2.3 10*3/uL (ref 0.9–3.3)

## 2015-06-01 LAB — URIC ACID: URIC ACID, SERUM: 4.8 mg/dL (ref 2.6–7.4)

## 2015-06-01 MED ORDER — SODIUM CHLORIDE 0.9 % IJ SOLN
10.0000 mL | INTRAMUSCULAR | Status: DC | PRN
  Administered 2015-06-01: 10 mL via INTRAVENOUS
  Filled 2015-06-01: qty 10

## 2015-06-01 MED ORDER — HEPARIN SOD (PORK) LOCK FLUSH 100 UNIT/ML IV SOLN
500.0000 [IU] | Freq: Once | INTRAVENOUS | Status: AC
  Administered 2015-06-01: 500 [IU] via INTRAVENOUS
  Filled 2015-06-01: qty 5

## 2015-06-01 NOTE — Telephone Encounter (Signed)
Appointments made and avs printed °

## 2015-06-01 NOTE — Progress Notes (Signed)
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 She had CT AP 04-20-15 and saw Dr Thurston Pounds on 04-21-15, NED by exam, scans, labs and now on observation. She will see Dr Thurston Pounds again in 3 months. She has PAC, to be kept for now.  Patient has been gradually recovering from all of the gyn cancer treatment since completing adjuvant chemotherapy. She has returned to work full time. She is too fatigued after work to do much else then, but on weekends is trying to be more active at home. She denies abdominal or pelvic pain. She had respiratory infection in Dec, CXR clear, resolved with treatment by PCP. No recent fever or symptoms of infection. No problems with PAC. No bleeding. Slight LE swelling at times. Bowels moving regularly. Bladder ok.  Remainder of 10 point Review of Systems negative / unchanged  PAC placed by IR 12-04-14, flushed today without difficulty. Genetics testing sent 12-17-14 normal ([employer]) Pre op CA 125 554 FLu vaccine 03-26-15   ONCOLOGIC HISTORY Patient had history of endometriosis and infertility, with no abnormal PAP smears but no recent gyn exams. She was in usual health until a few weeks of intermittent low grade nausea, then <24 hours of progressively more severe right upper quadrant abdominal pain. She presented to [facility] ED with the abdominal pain on 11-09-14, with abdominal US not remarkable, then CT AP demonstrated bilateral solid and cystic ovarian masses in total measuring 14 x 10 x 11 cm. She was transferred to [facility] Dr Thurston Pounds, with preoperative CA 125 of 554. On 11-10-14 she had exploratory laparotomy with supracervical hysterectomy, BSO, infracolic omentectomy, bilateral pelvic lymphadenectomy and aortic node sampling.  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 . Surgical pathology from [facility] [phone]) from 11-10-14 had grade 3 clear cell carcinoma in 6 cm right ovary and 11 cm left ovary with capsules intact, bilateral fallopian tubes not involved, uterus and omentum not involved, no LVSI, for stage IB. No malignant cells in ascites or peritoneal washings. Her postoperative course was uncomplicated. [facility] multidisciplinary conference recommended 6 cycles of adjuvant taxol carboplatin. She will have 6 week post op visit with Dr Thurston Pounds. First carbo taxol was given 3-57-01, complicated by taxol reaction despite full premedication, and severe taxol aches. She was neutropenic on day 14 cycle 3 with ANC 0.6. [facility] gene panel normal, sent 12-17-14.   Objective:  Vital signs in last 24 hours:  BP 134/72 mmHg  Pulse 81  Temp(Src) 98 F (36.7 C) (Oral)  Resp 18  Ht '5\' 8"'  (1.727 m)  Wt 286 lb 8 oz (129.956 kg)  BMI 43.57 kg/m2  SpO2 99%  LMP 11/02/2014 Weight up 6 lbs Alert, oriented and appropriate. Ambulatory without assistance  Hair is growing back  HEENT:PERRL, sclerae not icteric. Oral mucosa moist without lesions, posterior pharynx clear.  Neck supple. No JVD.  Lymphatics:no cervical,supraclavicular, axillary or inguinal adenopathy Resp: clear to auscultation bilaterally and normal percussion bilaterally Cardio: regular rate and rhythm. No gallop. GI: abdomen obese, soft, nontender, not distended, cannot appreciate mass or organomegaly. Normally  active bowel sounds. Surgical incision closed, soft, not tender, not remarkable. Musculoskeletal/ Extremities: without pitting edema, cords, tenderness Neuro:  peripheral  neuropathy feet > fingers may be slightly better.. Otherwise nonfocal. PSYCH appropriate mood and affect Skin without rash, ecchymosis, petechiae Portacath-without erythema or tenderness  Lab Results:  Results for orders placed or performed in visit on 06/01/15  CBC with Differential  Result Value Ref Range   WBC 8.8 3.9 - 10.3 10e3/uL   NEUT# 5.8 1.5 - 6.5 10e3/uL   HGB 12.8 11.6 - 15.9 g/dL   HCT 38.0 34.8 - 46.6 %   Platelets 273 145 - 400 10e3/uL   MCV 90.2 79.5 - 101.0 fL   MCH 30.4 25.1 - 34.0 pg   MCHC 33.7 31.5 - 36.0 g/dL   RBC 4.21 3.70 - 5.45 10e6/uL   RDW 14.5 11.2 - 14.5 %   lymph# 2.3 0.9 - 3.3 10e3/uL   MONO# 0.5 0.1 - 0.9 10e3/uL   Eosinophils Absolute 0.1 0.0 - 0.5 10e3/uL   Basophils Absolute 0.1 0.0 - 0.1 10e3/uL   NEUT% 66.1 38.4 - 76.8 %   LYMPH% 25.9 14.0 - 49.7 %   MONO% 6.1 0.0 - 14.0 %   EOS% 1.3 0.0 - 7.0 %   BASO% 0.6 0.0 - 2.0 %  Comprehensive metabolic panel  Result Value Ref Range   Sodium 139 136 - 145 mEq/L   Potassium 3.8 3.5 - 5.1 mEq/L   Chloride 104 98 - 109 mEq/L   CO2 27 22 - 29 mEq/L   Glucose 85 70 - 140 mg/dl   BUN 12.3 7.0 - 26.0 mg/dL   Creatinine 0.8 0.6 - 1.1 mg/dL   Total Bilirubin 1.26 (H) 0.20 - 1.20 mg/dL   Alkaline Phosphatase 60 40 - 150 U/L   AST 16 5 - 34 U/L   ALT 18 0 - 55 U/L   Total Protein 7.1 6.4 - 8.3 g/dL   Albumin 3.7 3.5 - 5.0 g/dL   Calcium 9.5 8.4 - 10.4 mg/dL   Anion Gap 7 3 - 11 mEq/L   EGFR >90 >90 ml/min/1.73 m2  CA 125  Result Value Ref Range   Cancer Antigen (CA) 125 27.9 0.0 - 38.1 U/mL  CA 125 (Parallel Testing)  Result Value Ref Range   CA 125 28 <35 U/mL   prior CA 125 results using methodology of "parallel testing" above:  32  on 03-26-15 and 37 on 02-27-15.  Studies/Results: CT ABDOMEN AND PELVIS WITH CONTRAST  04-20-15  COMPARISON: CT 11/09/2014  FINDINGS: Lower chest: Lung bases are clear.  Hepatobiliary: No focal hepatic lesion. No biliary duct dilatation. Gallbladder is  normal. Common bile duct is normal.  Pancreas: Pancreas is normal. No ductal dilatation. No pancreatic inflammation.  Spleen: Normal spleen  Adrenals/urinary tract: Adrenal glands and kidneys are normal. The ureters and bladder normal.  Stomach/Bowel: Stomach, small bowel, appendix, and cecum are normal. The colon and rectosigmoid colon are normal.  Vascular/Lymphatic: Abdominal aorta is normal caliber. There is no retroperitoneal or periportal lymphadenopathy. No pelvic lymphadenopathy.  Reproductive: Status post hysterectomy. Status post oophorectomy. No pelvic nodularity.  Small external iliac lymph nodes are unchanged. For example 9 mm LEFT external iliac lymph node on image 73, series 2.  Other: No peritoneal disease or omental disease.  Musculoskeletal: No aggressive osseous lesion.  IMPRESSION: 1. Patient status post oophorectomy and hysterectomy without evidence of local recurrence. 2. No evidence of lymphadenopathy or peritoneal metastasis.   PACs imagaes reviewed  EXAM: CHEST 2 VIEW  05-07-15  COMPARISON: November 11, 2008  FINDINGS: Port-A-Cath tip is in the superior vena cava. No pneumothorax. There is no edema or consolidation. Heart size and pulmonary vascularity are normal. No adenopathy. No bone lesions.  IMPRESSION: No edema or consolidation. Port-A-Cath tip in superior vena cava. No pneumothorax. No adenopathy apparent.   PACs images reviewed   Medications: I have reviewed the patient's current medications.  DISCUSSION Discussed encouraging findings from re-evaluation after completion of chemo; she understands that she will continue close observation.  DIscussed gradual improvement in stamina and other symptoms as she gets out further from chemo. Encouraged her to keep increasing activity as tolerated. Weight loss to ideal would be beneficial.  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 CA 125 in normal range and NED by scans early Dec. WIll repeat CA 125 from [facility] late Feb  for Dr Lucianne Lei Le's visit ~ 07-13-15. I will see her back after that, coordinating with PAC flushes. 2. Chemo neutropenia necessitating gCSF. Counts all in good range by CBC today 3.PAC in. Will keep in for now, flush every 6-8 weeks when not otherwise used.  4. 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 Continues gabapentin 5.joint symptoms: she is very encouraged by interventions suggested by [facility] Charlann Boxer at [facility].  6.surgical menopause: hot flashes still intermittent but perhaps not as bothersome, on celexa and gabapentin, gyn oncologist prefers no estrogen. . 7. Flu vaccine 03-2015 8.hypothyroidism: to follow up with PCP, on synthroid 9.morbid obesity, BMI 45. Had begun to lose weight biking prior to this diagnosis. Hopefully will be able to resume regular exercise soon.  10.hx migraines, no problem with zofran  11.anaphylaxis to avelox, rash to sulfa. Tolerates keflex without difficulty 12.possible slight lymphedema LE related to pelvic surgery. May need referral to lymphedema PT. Has used occasional low dose HCTZ 13. Degenerative arthritis right knee, known to Dr Veverly Fells. Arthralgias being treated holistically by [facility] now, including stretching, tumeric etc. Arthralgias now would not be chemo related    All questions answered. Time spent 25 min including >50% counseling and coordination of care. Cc Dr Lucianne Lei Aloha Gell, MD   06/03/2015, 3:13 PM

## 2015-06-02 LAB — CA 125: Cancer Antigen (CA) 125: 27.9 U/mL (ref 0.0–38.1)

## 2015-06-02 LAB — CANCER ANTIGEN 125 (PARALLEL TESTING): CA 125: 28 U/mL

## 2015-06-03 ENCOUNTER — Telehealth: Payer: Self-pay

## 2015-06-03 MED ORDER — LORAZEPAM 0.5 MG PO TABS: 0.5000 mg | ORAL_TABLET | Freq: Every evening | ORAL | Status: DC | PRN

## 2015-06-03 MED ORDER — CITALOPRAM HYDROBROMIDE 40 MG PO TABS: 40.0000 mg | ORAL_TABLET | Freq: Every day | ORAL | Status: DC

## 2015-06-03 NOTE — Telephone Encounter (Signed)
Medication signed and faxed

## 2015-06-03 NOTE — Telephone Encounter (Signed)
celexa refilled,   Xanax Done hardcopy to Heather  I would hold on the lift chair as this would go against Dr Thompson Caul advice of working on core strength and being active

## 2015-06-03 NOTE — Telephone Encounter (Signed)
Medication printed signed and faxed

## 2015-06-04 ENCOUNTER — Telehealth: Payer: Self-pay

## 2015-06-04 NOTE — Telephone Encounter (Signed)
-----   Message from Gordy Levan, MD sent at 06/02/2015  3:10 PM EST ----- Labs seen and need follow up: please let her know CA 125 in normal range at 28

## 2015-06-04 NOTE — Telephone Encounter (Signed)
LM in [name]'s VM stating the results of the CA-125 result noted below by Dr. Marko Plume.  [name] can call the [facility] at [phone] if she has any questions or concerns.

## 2015-06-15 ENCOUNTER — Ambulatory Visit (INDEPENDENT_AMBULATORY_CARE_PROVIDER_SITE_OTHER): Payer: BLUE CROSS/BLUE SHIELD | Admitting: Family Medicine

## 2015-06-15 ENCOUNTER — Encounter: Payer: Self-pay | Admitting: *Deleted

## 2015-06-15 ENCOUNTER — Encounter: Payer: Self-pay | Admitting: Family Medicine

## 2015-06-15 VITALS — BP 118/82 | HR 101 | Temp 99.5°F | Ht 68.0 in | Wt 293.2 lb

## 2015-06-15 DIAGNOSIS — G43809 Other migraine, not intractable, without status migrainosus: Secondary | ICD-10-CM

## 2015-06-15 MED ORDER — KETOROLAC TROMETHAMINE 30 MG/ML IM SOLN: 30.0000 mg | Freq: Once | INTRAMUSCULAR | Status: DC

## 2015-06-15 MED ORDER — KETOROLAC TROMETHAMINE 60 MG/2ML IM SOLN
30.0000 mg | Freq: Once | INTRAMUSCULAR | Status: AC
  Administered 2015-06-15: 30 mg via INTRAMUSCULAR

## 2015-06-15 MED ORDER — ONDANSETRON HCL 4 MG PO TABS: 4.0000 mg | ORAL_TABLET | Freq: Three times a day (TID) | ORAL | Status: DC | PRN

## 2015-06-15 MED ORDER — PROMETHAZINE HCL 25 MG/ML IJ SOLN
12.5000 mg | Freq: Once | INTRAMUSCULAR | Status: AC
  Administered 2015-06-15: 12.5 mg via INTRAMUSCULAR

## 2015-06-15 NOTE — Progress Notes (Signed)
Pre visit review using our clinic review tool, if applicable. No additional management support is needed unless otherwise documented below in the visit note. 

## 2015-06-15 NOTE — Progress Notes (Signed)
I called the pharmacy and spoke with [name] the pharmacist and advised her this was sent in error as I was entering an injection that the pt was given today in the office.

## 2015-06-15 NOTE — Patient Instructions (Addendum)
BEFORE YOU LEAVE: -follow up appointment with PCP in 3-5 weeks -migraine trigger journal  Keep track of your migraines  Try the OTC options along with 2-4 mg of zofran if you get another migraine  Follow up with your doctor sooner if worsening or other concerns  Migraine Headache A migraine headache is an intense, throbbing pain on one or both sides of your head. A migraine can last for 30 minutes to several hours. CAUSES  The exact cause of a migraine headache is not always known. However, a migraine may be caused when nerves in the brain become irritated and release chemicals that cause inflammation. This causes pain. Certain things may also trigger migraines, such as:  Alcohol.  Smoking.  Stress.  Menstruation.  Aged cheeses.  Foods or drinks that contain nitrates, glutamate, aspartame, or tyramine.  Lack of sleep.  Chocolate.  Caffeine.  Hunger.  Physical exertion.  Fatigue.  Medicines used to treat chest pain (nitroglycerine), birth control pills, estrogen, and some blood pressure medicines. SIGNS AND SYMPTOMS  Pain on one or both sides of your head.  Pulsating or throbbing pain.  Severe pain that prevents daily activities.  Pain that is aggravated by any physical activity.  Nausea, vomiting, or both.  Dizziness.  Pain with exposure to bright lights, loud noises, or activity.  General sensitivity to bright lights, loud noises, or smells. Before you get a migraine, you may get warning signs that a migraine is coming (aura). An aura may include:  Seeing flashing lights.  Seeing bright spots, halos, or zigzag lines.  Having tunnel vision or blurred vision.  Having feelings of numbness or tingling.  Having trouble talking.  Having muscle weakness. DIAGNOSIS  A migraine headache is often diagnosed based on:  Symptoms.  Physical exam.  A CT scan or MRI of your head. These imaging tests cannot diagnose migraines, but they can help rule out  other causes of headaches. TREATMENT Medicines may be given for pain and nausea. Medicines can also be given to help prevent recurrent migraines.  HOME CARE INSTRUCTIONS  Only take over-the-counter or prescription medicines for pain or discomfort as directed by your health care provider. The use of long-term narcotics is not recommended.  Lie down in a dark, quiet room when you have a migraine.  Keep a journal to find out what may trigger your migraine headaches. For example, write down:  What you eat and drink.  How much sleep you get.  Any change to your diet or medicines.  Limit alcohol consumption.  Quit smoking if you smoke.  Get 7-9 hours of sleep, or as recommended by your health care provider.  Limit stress.  Keep lights dim if bright lights bother you and make your migraines worse. SEEK IMMEDIATE MEDICAL CARE IF:   Your migraine becomes severe.  You have a fever.  You have a stiff neck.  You have vision loss.  You have muscular weakness or loss of muscle control.  You start losing your balance or have trouble walking.  You feel faint or pass out.  You have severe symptoms that are different from your first symptoms. MAKE SURE YOU:   Understand these instructions.  Will watch your condition.  Will get help right away if you are not doing well or get worse.   This information is not intended to replace advice given to you by your health care provider. Make sure you discuss any questions you have with your health care provider.   Document Released:  04/25/2005 Document Revised: 05/16/2014 Document Reviewed: 12/31/2012 Elsevier Interactive Patient Education [employer].

## 2015-06-15 NOTE — Progress Notes (Addendum)
HPI:  [name] is a pleasant 45 yo patient with a PMH anxiety, migraines, ovarian Ca here for an acute visit for:  Migraine: -reports long hx of migraines treated with numerous medications in the past including topamax (made her too drowsy), triptans (gave her neck tightness), excedrin, amoung others -she has intermittent spells of worsening headaches and has a list of known triggers -they had been better for awhile, then worsened over the last year with chemo and had several migraines this week and one the last few days -she usually take excedrin -migraine today is typical of migraines in the past in character with R sided periorbital pain, photo and phonophobia, nausea -denies: fevers, neck stiffness, change in character, aura, vomiting, diarrhea, weakness, change in language or sppech   ROS: See pertinent positives and negatives per HPI.  Past Medical History  Diagnosis Date  . Anxiety   . Endometriosis   . Ovarian cancer, bilateral ([place]) 11/28/2014  . Thyroid disease     Past Surgical History  Procedure Laterality Date  . Abdominal hysterectomy  11/10/14    at [facility], Exp lap, supracervical hyst, BSO, infracolic omentectomy, lymphadenectomy, aortic lymph node sampling    Family History  Problem Relation Age of Onset  . Hypertension Mother   . Diabetes Mother   . Atrial fibrillation Father   . Diabetes Maternal Aunt   . Heart failure Maternal Grandmother   . Heart attack Maternal Grandfather   . Dementia Paternal Grandmother   . Dementia Paternal Grandfather     Social History   Social History  . Marital Status: Divorced    Spouse Name: N/A  . Number of Children: 0  . Years of Education: N/A   Social History Main Topics  . Smoking status: Never Smoker   . Smokeless tobacco: None  . Alcohol Use: Yes     Comment: occassionally  . Drug Use: No  . Sexual Activity: Not Asked   Other Topics Concern  . None   Social History Narrative     Current  outpatient prescriptions:  .  albuterol (PROVENTIL HFA;VENTOLIN HFA) 108 (90 Base) MCG/ACT inhaler, Inhale 2 puffs into the lungs every 6 (six) hours as needed for wheezing or shortness of breath., Disp: 1 Inhaler, Rfl: 2 .  aspirin-acetaminophen-caffeine (EXCEDRIN MIGRAINE) [id number] MG per tablet, Take 2 tablets by mouth 2 (two) times daily as needed for headache or migraine., Disp: , Rfl:  .  citalopram (CELEXA) 40 MG tablet, Take 1 tablet (40 mg total) by mouth daily., Disp: 90 tablet, Rfl: 3 .  Cyanocobalamin (VITAMIN B-12 PO), Take 2 tablets by mouth daily., Disp: , Rfl:  .  diclofenac sodium (VOLTAREN) 1 % GEL, Use topically four times per day as needed for pain, Disp: 100 g, Rfl: 11 .  gabapentin (NEURONTIN) 300 MG capsule, Take 300 mg by mouth 3 (three) times daily. , Disp: , Rfl:  .  hydrochlorothiazide (MICROZIDE) 12.5 MG capsule, Take 1 capsule PO QD PRN only. (Patient taking differently: Take 12.5 mg by mouth daily as needed (fluid). ), Disp: 20 capsule, Rfl: 0 .  HYDROcodone-homatropine (HYCODAN) 5-1.5 MG/5ML syrup, Take 5 mLs by mouth every 6 (six) hours as needed for cough., Disp: 180 mL, Rfl: 0 .  levothyroxine (LEVOTHROID) 25 MCG tablet, Take 1 tablet (25 mcg total) by mouth daily before breakfast., Disp: 90 tablet, Rfl: 3 .  lidocaine-prilocaine (EMLA) cream, Apply 1-2 hrs prior to Porta-Cath access (Patient taking differently: Apply 1 application topically daily as needed (port  access). Apply 1-2 hrs prior to Porta-Cath access), Disp: 30 g, Rfl: 1 .  LORazepam (ATIVAN) 0.5 MG tablet, Take 1 tablet (0.5 mg total) by mouth at bedtime as needed. sleep, Disp: 30 tablet, Rfl: 5 .  pantoprazole (PROTONIX) 40 MG tablet, Take 1 tablet twice a day for 2 weeks then 1 tablet daily. (Patient taking differently: Take 40 mg by mouth daily as needed (heartburn). ), Disp: 45 tablet, Rfl: 0 .  sucralfate (CARAFATE) 1 G tablet, Take 1 tablet (1 g total) by mouth 4 (four) times daily -  before meals  and at bedtime., Disp: 120 tablet, Rfl: 2 .  triamcinolone cream (KENALOG) 0.1 %, Apply 1 application topically 2 (two) times daily. (Patient taking differently: Apply 1 application topically 2 (two) times daily as needed (rash/irritation). ), Disp: 30 g, Rfl: 0 .  ondansetron (ZOFRAN) 4 MG tablet, Take 1 tablet (4 mg total) by mouth every 8 (eight) hours as needed for nausea or vomiting., Disp: 20 tablet, Rfl: 0  EXAM:  Filed Vitals:   06/15/15 1346  BP: 118/82  Pulse: 101  Temp: 99.5 F (37.5 C)    Body mass index is 44.59 kg/(m^2).  GENERAL: vitals reviewed and listed above, alert, PERRLA, oriented, appears well hydrated and in no acute distress  HEENT: atraumatic, conjunttiva clear, no obvious abnormalities on inspection of external nose and ears  NECK: no obvious masses on inspection, no meningeal signs  LUNGS: clear to auscultation bilaterally, no wheezes, rales or rhonchi, good air movement  CV: HRRR, no peripheral edema  MS: moves all extremities without noticeable abnormality  PSYCH/NEURO: pleasant and cooperative, no obvious depression, somewhat anxious in demeanor, gait normal, speech and thought processing grossly intact, CN II_XII grossly intact, finger to nose normal  ASSESSMENT AND PLAN:  Discussed the following assessment and plan:  Other type of migraine - Plan: promethazine (PHENERGAN) injection 12.5 mg, ketorolac (TORADOL) injection 30 mg, DISCONTINUED: ketorolac (TORADOL) 30 MG/ML injection  -tx today with toradol and phenergan - pt tolerated well -headache journal and trial zofran with OTC meds - limit to no more then several days per week to avoid rebound -advised if neck tightness with triptans probably should not take again unless PCP or neurologist advises otherwise -close follow up with PCP and if persistent increased frequency may need neuroimaging if out of ordinary for her and may need to consider trying other prophylactic medications -Patient  advised to return or notify a doctor immediately if symptoms worsen or persist or new concerns arise.  Patient Instructions  BEFORE YOU LEAVE: -follow up appointment with PCP in 3-5 weeks -migraine trigger journal  Keep track of your migraines  Try the OTC options along with 2-4 mg of zofran if you get another migraine  Follow up with your doctor sooner if worsening or other concerns  Migraine Headache A migraine headache is an intense, throbbing pain on one or both sides of your head. A migraine can last for 30 minutes to several hours. CAUSES  The exact cause of a migraine headache is not always known. However, a migraine may be caused when nerves in the brain become irritated and release chemicals that cause inflammation. This causes pain. Certain things may also trigger migraines, such as:  Alcohol.  Smoking.  Stress.  Menstruation.  Aged cheeses.  Foods or drinks that contain nitrates, glutamate, aspartame, or tyramine.  Lack of sleep.  Chocolate.  Caffeine.  Hunger.  Physical exertion.  Fatigue.  Medicines used to treat chest pain (  nitroglycerine), birth control pills, estrogen, and some blood pressure medicines. SIGNS AND SYMPTOMS  Pain on one or both sides of your head.  Pulsating or throbbing pain.  Severe pain that prevents daily activities.  Pain that is aggravated by any physical activity.  Nausea, vomiting, or both.  Dizziness.  Pain with exposure to bright lights, loud noises, or activity.  General sensitivity to bright lights, loud noises, or smells. Before you get a migraine, you may get warning signs that a migraine is coming (aura). An aura may include:  Seeing flashing lights.  Seeing bright spots, halos, or zigzag lines.  Having tunnel vision or blurred vision.  Having feelings of numbness or tingling.  Having trouble talking.  Having muscle weakness. DIAGNOSIS  A migraine headache is often diagnosed based  on:  Symptoms.  Physical exam.  A CT scan or MRI of your head. These imaging tests cannot diagnose migraines, but they can help rule out other causes of headaches. TREATMENT Medicines may be given for pain and nausea. Medicines can also be given to help prevent recurrent migraines.  HOME CARE INSTRUCTIONS  Only take over-the-counter or prescription medicines for pain or discomfort as directed by your health care provider. The use of long-term narcotics is not recommended.  Lie down in a dark, quiet room when you have a migraine.  Keep a journal to find out what may trigger your migraine headaches. For example, write down:  What you eat and drink.  How much sleep you get.  Any change to your diet or medicines.  Limit alcohol consumption.  Quit smoking if you smoke.  Get 7-9 hours of sleep, or as recommended by your health care provider.  Limit stress.  Keep lights dim if bright lights bother you and make your migraines worse. SEEK IMMEDIATE MEDICAL CARE IF:   Your migraine becomes severe.  You have a fever.  You have a stiff neck.  You have vision loss.  You have muscular weakness or loss of muscle control.  You start losing your balance or have trouble walking.  You feel faint or pass out.  You have severe symptoms that are different from your first symptoms. MAKE SURE YOU:   Understand these instructions.  Will watch your condition.  Will get help right away if you are not doing well or get worse.   This information is not intended to replace advice given to you by your health care provider. Make sure you discuss any questions you have with your health care provider.   Document Released: 04/25/2005 Document Revised: 05/16/2014 Document Reviewed: 12/31/2012 Elsevier Interactive Patient Education 2016 [place], [place]

## 2015-06-17 ENCOUNTER — Encounter: Payer: Self-pay | Admitting: Nurse Practitioner

## 2015-06-17 ENCOUNTER — Ambulatory Visit (INDEPENDENT_AMBULATORY_CARE_PROVIDER_SITE_OTHER): Payer: BLUE CROSS/BLUE SHIELD | Admitting: Nurse Practitioner

## 2015-06-17 VITALS — BP 110/68 | HR 88 | Temp 98.7°F | Ht 68.0 in | Wt 286.8 lb

## 2015-06-17 DIAGNOSIS — G43001 Migraine without aura, not intractable, with status migrainosus: Secondary | ICD-10-CM

## 2015-06-17 MED ORDER — SUMATRIPTAN-NAPROXEN SODIUM 85-500 MG PO TABS: 1.0000 | ORAL_TABLET | ORAL | Status: DC | PRN

## 2015-06-17 MED ORDER — PREDNISONE 10 MG PO TABS: ORAL_TABLET | ORAL | Status: DC

## 2015-06-17 MED ORDER — ONDANSETRON HCL 4 MG PO TABS: 4.0000 mg | ORAL_TABLET | Freq: Three times a day (TID) | ORAL | Status: DC | PRN

## 2015-06-17 NOTE — Progress Notes (Signed)
Patient ID: [name], female    DOB: 03/25/1971  Age: 45 y.o. MRN: [id number]  CC: Migraine; Nausea; and Diarrhea   HPI [name] presents for CC of migraine, nausea, and diarrhea.   1) Migraine x 1 week  Saw Dr. Maudie Hall on 06/15/15 for migraine  (I have personally reviewed her note)  Phenergan, Toradol- injected at visit  Advised her to keep a migraine journal   Napped for 3 hours, it was tolerable, worsened yesterday morning. Pressure frontal and behind right eye, tension in neck and shoulders. Monday had chills- no fever when checked- was under a heated blanket.  Sweet smells- nausea  Photophobia  Trapezius tenderness and occipital tension   Maxalt, Topamax not helpful  Not tried: treximet  2) Nausea- intermittent last Chemo tx 03/20/2015  Denies vomiting Zofran- helpful   3) Diarrhea- Intermittent, last episode this morning  Yesterday was the worst 2 episodes- watery  Not related to food intake Last BM- This morning it was loose   History [name] has a past medical history of Anxiety; Endometriosis; Ovarian cancer, bilateral ([place]) (11/28/2014); and Thyroid disease.   She has past surgical history that includes Abdominal hysterectomy (11/10/14).   Her family history includes Atrial fibrillation in her father; Dementia in her paternal grandfather and paternal grandmother; Diabetes in her maternal aunt and mother; Heart attack in her maternal grandfather; Heart failure in her maternal grandmother; Hypertension in her mother.She reports that she has never smoked. She does not have any smokeless tobacco history on file. She reports that she drinks alcohol. She reports that she does not use illicit drugs.  Outpatient Prescriptions Prior to Visit  Medication Sig Dispense Refill  . albuterol (PROVENTIL HFA;VENTOLIN HFA) 108 (90 Base) MCG/ACT inhaler Inhale 2 puffs into the lungs every 6 (six) hours as needed for wheezing or shortness of breath. 1 Inhaler 2  .  aspirin-acetaminophen-caffeine (EXCEDRIN MIGRAINE) [id number] MG per tablet Take 2 tablets by mouth 2 (two) times daily as needed for headache or migraine.    . citalopram (CELEXA) 40 MG tablet Take 1 tablet (40 mg total) by mouth daily. 90 tablet 3  . Cyanocobalamin (VITAMIN B-12 PO) Take 2 tablets by mouth daily.    Marland Kitchen gabapentin (NEURONTIN) 300 MG capsule Take 300 mg by mouth 3 (three) times daily.     Marland Kitchen levothyroxine (LEVOTHROID) 25 MCG tablet Take 1 tablet (25 mcg total) by mouth daily before breakfast. 90 tablet 3  . LORazepam (ATIVAN) 0.5 MG tablet Take 1 tablet (0.5 mg total) by mouth at bedtime as needed. sleep 30 tablet 5  . pantoprazole (PROTONIX) 40 MG tablet Take 1 tablet twice a day for 2 weeks then 1 tablet daily. (Patient taking differently: Take 40 mg by mouth daily as needed (heartburn). ) 45 tablet 0  . sucralfate (CARAFATE) 1 G tablet Take 1 tablet (1 g total) by mouth 4 (four) times daily -  before meals and at bedtime. 120 tablet 2  . ondansetron (ZOFRAN) 4 MG tablet Take 1 tablet (4 mg total) by mouth every 8 (eight) hours as needed for nausea or vomiting. 20 tablet 0  . diclofenac sodium (VOLTAREN) 1 % GEL Use topically four times per day as needed for pain (Patient not taking: Reported on 06/17/2015) 100 g 11  . hydrochlorothiazide (MICROZIDE) 12.5 MG capsule Take 1 capsule PO QD PRN only. (Patient not taking: Reported on 06/17/2015) 20 capsule 0  . HYDROcodone-homatropine (HYCODAN) 5-1.5 MG/5ML syrup Take 5 mLs by mouth every 6 (six)  hours as needed for cough. (Patient not taking: Reported on 06/17/2015) 180 mL 0  . lidocaine-prilocaine (EMLA) cream Apply 1-2 hrs prior to [facility] access (Patient not taking: Reported on 06/17/2015) 30 g 1  . triamcinolone cream (KENALOG) 0.1 % Apply 1 application topically 2 (two) times daily. (Patient not taking: Reported on 06/17/2015) 30 g 0   No facility-administered medications prior to visit.    ROS Review of Systems  Constitutional:  Positive for chills. Negative for fever, diaphoresis and fatigue.  HENT: Positive for ear pain.        Right  Eyes: Positive for photophobia. Negative for visual disturbance.  Respiratory: Negative for chest tightness, shortness of breath and wheezing.   Cardiovascular: Negative for chest pain, palpitations and leg swelling.  Gastrointestinal: Positive for nausea and diarrhea. Negative for vomiting and rectal pain.  Skin: Negative for rash.  Neurological: Positive for light-headedness and headaches. Negative for dizziness and numbness.   Objective:  BP 110/68 mmHg  Pulse 88  Temp(Src) 98.7 F (37.1 C) (Oral)  Ht 5\' 8"  (1.727 m)  Wt 286 lb 12 oz (130.069 kg)  BMI 43.61 kg/m2  SpO2 95%  LMP 11/02/2014  Physical Exam  Constitutional: She is oriented to person, place, and time. She appears well-developed and well-nourished. No distress.  HENT:  Head: Normocephalic and atraumatic.  Right Ear: External ear normal.  Left Ear: External ear normal.  Mouth/Throat: Oropharynx is clear and moist.  Frontal pressure, left TM slightly injected, not bulging or retracted. Right TM clear not injected/bulging Frontal sinus tenderness  Eyes: EOM are normal. Pupils are equal, round, and reactive to light. Right eye exhibits no discharge. Left eye exhibits no discharge. No scleral icterus.  Neck: Normal range of motion. Neck supple.  Cardiovascular: Normal rate, regular rhythm and normal heart sounds.  Exam reveals no gallop and no friction rub.   No murmur heard. Pulmonary/Chest: Effort normal and breath sounds normal. No respiratory distress. She has no wheezes. She has no rales. She exhibits no tenderness.  Abdominal: Soft. Bowel sounds are normal. She exhibits no distension and no mass. There is tenderness. There is no rebound and no guarding.  Generalized upper abdominal tenderness  Lymphadenopathy:    She has no cervical adenopathy.  Neurological: She is alert and oriented to person, place,  and time. Coordination normal.  Skin: Skin is warm and dry. No rash noted. She is not diaphoretic.  Psychiatric: She has a normal mood and affect. Her behavior is normal. Judgment and thought content normal.   Assessment & Plan:   There are no diagnoses linked to this encounter. I have discontinued [name]'s triamcinolone cream, lidocaine-prilocaine, hydrochlorothiazide, and HYDROcodone-homatropine. I am also having her start on predniSONE and SUMAtriptan-naproxen. Additionally, I am having her maintain her diclofenac sodium, levothyroxine, Cyanocobalamin (VITAMIN B-12 PO), aspirin-acetaminophen-caffeine, gabapentin, sucralfate, pantoprazole, albuterol, citalopram, LORazepam, and ondansetron.  Meds ordered this encounter  Medications  . predniSONE (DELTASONE) 10 MG tablet    Sig: Take 6 tablets by mouth on day 1 then decrease by 1 tablet each day until gone.    Dispense:  21 tablet    Refill:  0    Order Specific Question:  Supervising Provider    Answer:  Deborra Medina L [[id number]]  . ondansetron (ZOFRAN) 4 MG tablet    Sig: Take 1 tablet (4 mg total) by mouth every 8 (eight) hours as needed for nausea or vomiting.    Dispense:  20 tablet    Refill:  0  Order Specific Question:  Supervising Provider    Answer:  Crecencio Mc [[id number]]  . SUMAtriptan-naproxen (TREXIMET) 85-500 MG tablet    Sig: Take 1 tablet by mouth every 2 (two) hours as needed for migraine.    Dispense:  6 tablet    Refill:  0     Follow-up: Return if symptoms worsen or fail to improve.

## 2015-06-17 NOTE — Assessment & Plan Note (Signed)
New problem to me Phenergan and Toradol IM was helpful 2 days ago for a short period of time Due to trapezius, occipital, and frontal pressure/tenderness will try Prednisone Prednisone taper sent to pharmacy with instructions. Asked her to give it about 48 hrs before throwing in the towel.  Can set up for CT or Neuro referral if she does not feel this regimen helps.  Treximet sample given- told her not to take until completed Prednisone taper.  FU prn worsening/failure to improve.

## 2015-06-17 NOTE — Patient Instructions (Addendum)
Prednisone with breakfast or lunch at the latest.  6 tablets on day 1, 5 tablets on day 2, 4 tablets on day 3, 3 tablets on day 4, 2 tablets day 5, 1 tablet on day 6...done! Take tablets all together not spaced out Don't take with NSAIDs (Ibuprofen, Aleve, Naproxen, Meloxicam ect...)  If this is not helpful in 48 hrs stop the medication and we can get you set up for a scan of your head to see if anything is going on. We will also be happy to refer you to neurology. Call us if anything changes or you are concerned about.

## 2015-06-23 ENCOUNTER — Encounter: Payer: Self-pay | Admitting: Internal Medicine

## 2015-06-25 ENCOUNTER — Ambulatory Visit (INDEPENDENT_AMBULATORY_CARE_PROVIDER_SITE_OTHER): Payer: BLUE CROSS/BLUE SHIELD | Admitting: Family Medicine

## 2015-06-25 ENCOUNTER — Encounter: Payer: Self-pay | Admitting: Family Medicine

## 2015-06-25 VITALS — BP 114/80 | HR 93 | Ht 68.0 in | Wt 291.0 lb

## 2015-06-25 DIAGNOSIS — M7670 Peroneal tendinitis, unspecified leg: Secondary | ICD-10-CM

## 2015-06-25 DIAGNOSIS — M999 Biomechanical lesion, unspecified: Secondary | ICD-10-CM | POA: Insufficient documentation

## 2015-06-25 DIAGNOSIS — M7672 Peroneal tendinitis, left leg: Secondary | ICD-10-CM | POA: Insufficient documentation

## 2015-06-25 DIAGNOSIS — M2550 Pain in unspecified joint: Secondary | ICD-10-CM | POA: Diagnosis not present

## 2015-06-25 DIAGNOSIS — M9901 Segmental and somatic dysfunction of cervical region: Secondary | ICD-10-CM

## 2015-06-25 MED ORDER — AMBULATORY NON FORMULARY MEDICATION: Status: DC

## 2015-06-25 MED ORDER — VENLAFAXINE HCL ER 37.5 MG PO CP24
37.5000 mg | ORAL_CAPSULE | Freq: Every day | ORAL | Status: DC
Start: 2015-06-25 — End: 2015-07-20

## 2015-06-25 NOTE — Assessment & Plan Note (Signed)
Encourage weight loss. 

## 2015-06-25 NOTE — Assessment & Plan Note (Signed)
Decision today to treat with OMT was based on Physical Exam  After verbal consent patient was treated with ME, FPR techniques in cervical and thoracic areas  Patient tolerated the procedure well with improvement in symptoms  Patient given exercises, stretches and lifestyle modifications  See medications in patient instructions if given  Patient will follow up in 3 weeks   Discussed with patient again about the risk with her having a history of cancer. Patient stated that she would like to take this risk. Verbally understood.

## 2015-06-25 NOTE — Progress Notes (Signed)
[facility] [address], [place] [address] Phone: [phone] Subjective:    I'm seeing this patient by the request  of:  [name], MD   CC:  Polyarthralgia f/u  [id number] [name] is a 45 y.o. female coming in with complaint of  Multiple joint pains. Patient does have a past medical history significant for ovarian cancer and patient is in the process of receiving chemotherapy. 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  Patient continues to have some discomfort. Did started on the natural vitamins and has been trying to make the different dietary changes. Patient states that overall she would expect herself to be a little bit better. Was recently put on prednisone for cold states that she's been on that she has been feeling better. Patient states though that she still has the aches and pains that is keeping her from working on a regular basis. Patient is going to start doing multiple different activities and wants to make sure that she is able to do so. Having some left ankle pain and some other things from time to time but nothing that is severe. Overall though it more of a chronic fatigue and soreness that keeps her from being extremely active.     Past Medical History  Diagnosis Date  . Anxiety   . Endometriosis   . Ovarian cancer, bilateral ([place]) 11/28/2014  . Thyroid disease    Past Surgical History  Procedure Laterality Date  . Abdominal hysterectomy  11/10/14    at [facility], Exp lap, supracervical hyst, BSO, infracolic omentectomy, lymphadenectomy, aortic lymph node sampling   Social History   Social History  . Marital Status: Divorced    Spouse Name: N/A  . Number of Children: 0  . Years of Education: N/A   Social History Main Topics  . Smoking status: Never Smoker   . Smokeless tobacco: None  . Alcohol Use: Yes     Comment: occassionally  . Drug Use: No  . Sexual Activity: Not  Asked   Other Topics Concern  . None   Social History Narrative   Allergies  Allergen Reactions  . Moxifloxacin Anaphylaxis    needs epinephrine shot  . Quinolones Anaphylaxis, Shortness Of Breath and Swelling  . Doxycycline Nausea And Vomiting  . Escitalopram Oxalate Other (See Comments)     fatigue  . Sulfonamide Derivatives Swelling and Rash    needs epinephrine shot--rash and lip swelling   Family History  Problem Relation Age of Onset  . Hypertension Mother   . Diabetes Mother   . Atrial fibrillation Father   . Diabetes Maternal Aunt   . Heart failure Maternal Grandmother   . Heart attack Maternal Grandfather   . Dementia Paternal Grandmother   . Dementia Paternal Grandfather     Past medical history, social, surgical and family history all reviewed in electronic medical record.  No pertanent information unless stated regarding to the chief complaint.   Review of Systems: No headache, visual changes, nausea, vomiting, diarrhea, constipation, dizziness, abdominal pain, skin rash, fevers, chills, night sweats, weight loss, swollen lymph nodes, body aches, joint swelling, muscle aches, chest pain, shortness of breath, mood changes.   Objective Blood pressure 114/80, pulse 93, height 5\' 8"  (1.727 m), weight 291 lb (131.997 kg), last menstrual period 11/02/2014, SpO2 98 %.  General: No apparent distress alert and oriented x3 mood and affect normal, dressed appropriately.  HEENT: Pupils equal, extraocular movements intact  Respiratory: Patient's speak in full sentences and does not appear short of breath  Cardiovascular: No lower extremity edema, non tender, no erythema  Skin: Warm dry intact with no signs of infection or rash on extremities or on axial skeleton.  Abdomen: Soft nontender  Neuro: Cranial nerves II through XII are intact, neurovascularly intact in all extremities with 2+ DTRs and 2+ pulses.  Lymph: No lymphadenopathy of posterior or anterior cervical chain or  axillae bilaterally.  Gait normal with good balance and coordination.  MSK:  Non tender with full range of motion and good stability and symmetric strength and tone of shoulders, elbows, wrist, hip, knees bilaterally.  Neck: Inspection unremarkable. No palpable stepoffs. Negative Spurling's maneuver. Full neck range of motion tightness on the left trapezius muscle Grip strength and sensation normal in bilateral hands Strength good C4 to T1 distribution No sensory change to C4 to T1 Negative Hoffman sign bilaterally Reflexes normal Back Exam:  Inspection: Unremarkable poor core strength Motion: Flexion 35 deg, Extension 15 deg, Side Bending to 25 deg bilaterally,  Rotation to 30 deg bilaterally  SLR laying: Negative  XSLR laying: Negative  Palpable tenderness: tender to palpation of the paraspinal musculature of the lumbar spine mostly on the right side.Marland Kitchen FABER: negative. Sensory change: Gross sensation intact to all lumbar and sacral dermatomes.  Reflexes: 2+ at both patellar tendons, 2+ at achilles tendons, Babinski's downgoing.  Strength at foot  Plantar-flexion: 5/5 Dorsi-flexion: 5/5 Eversion: 5/5 Inversion: 5/5  Leg strength  Quad: 5/5 Hamstring: 5/5 Hip flexor: 5/5 Hip abductors: 5/5  Gait unremarkable.  Ankle: Left No visible erythema or swelling. Range of motion is full in all directions. Strength is 5/5 in all directions. Stable lateral and medial ligaments; squeeze test and kleiger test unremarkable; Talar dome nontender; No pain at base of 5th MT; No tenderness over cuboid; No tenderness over N spot or navicular prominence No tenderness on posterior aspects of lateral and medial malleolus Tender over the peroneal tendons and patient does have a cyst Negative tarsal tunnel tinel's Able to walk 4 steps. Contralateral ankle unremarkable  Osteopathic findings C4 flexed rotated and side bent left  T3 extended rotated and side bent right T8 extended rotated and  side bent left   Impression and Recommendations:     This case required medical decision making of moderate complexity.      Note: This dictation was prepared with Dragon dictation along with smaller phrase technology. Any transcriptional errors that result from this process are unintentional.

## 2015-06-25 NOTE — Progress Notes (Signed)
Pre visit review using our clinic review tool, if applicable. No additional management support is needed unless otherwise documented below in the visit note. 

## 2015-06-25 NOTE — Assessment & Plan Note (Signed)
Change medications

## 2015-06-25 NOTE — Assessment & Plan Note (Signed)
Continues to be multifactorial pain. Patient is not able to be active secondary to the discomfort. I do think the patient's obesity as well as underlying depression is also contribute in. I do think that chemotherapy could also contributed to some inflammation. Patient would like to stay from significant amount of prescription medications if possible. We discussed other over-the-counter medications a could be helpful. Patient does stop the gabapentin with her not making any significant improvement. We discussed Effexor that could be helping the peripheral neuropathy as well as potentially the underlying depression. Patient is willing to try that and we will start a low dose and patient will titrate off of her Celexa. We discussed different dietary changes it can be beneficial as well. Patient will come back and see me again in 4 weeks.

## 2015-06-25 NOTE — Patient Instructions (Signed)
Good to see you  Stop the gabapentin  Start effexor 37.5 mg and take only 20mg  of celexa for next week or 2.  If feeling good then discontinue the celexa after 2 weeks Stop turmeric in a month if it is not helping and then start CoQ10 400mg  daily Exercises for the ankle 3 times a week Stay active See me again in 4 weeks.

## 2015-07-06 ENCOUNTER — Other Ambulatory Visit: Payer: Self-pay

## 2015-07-06 DIAGNOSIS — C561 Malignant neoplasm of right ovary: Secondary | ICD-10-CM

## 2015-07-06 DIAGNOSIS — C562 Malignant neoplasm of left ovary: Principal | ICD-10-CM

## 2015-07-06 DIAGNOSIS — C563 Malignant neoplasm of bilateral ovaries: Secondary | ICD-10-CM

## 2015-07-07 ENCOUNTER — Ambulatory Visit (HOSPITAL_BASED_OUTPATIENT_CLINIC_OR_DEPARTMENT_OTHER): Payer: BLUE CROSS/BLUE SHIELD

## 2015-07-07 ENCOUNTER — Other Ambulatory Visit (HOSPITAL_BASED_OUTPATIENT_CLINIC_OR_DEPARTMENT_OTHER): Payer: BLUE CROSS/BLUE SHIELD

## 2015-07-07 VITALS — BP 121/86 | HR 74 | Temp 98.3°F | Resp 18

## 2015-07-07 DIAGNOSIS — C563 Malignant neoplasm of bilateral ovaries: Secondary | ICD-10-CM

## 2015-07-07 DIAGNOSIS — C562 Malignant neoplasm of left ovary: Secondary | ICD-10-CM | POA: Diagnosis not present

## 2015-07-07 DIAGNOSIS — C561 Malignant neoplasm of right ovary: Secondary | ICD-10-CM

## 2015-07-07 LAB — CBC WITH DIFFERENTIAL/PLATELET
BASO%: 0.2 % (ref 0.0–2.0)
BASOS ABS: 0 10*3/uL (ref 0.0–0.1)
EOS%: 2 % (ref 0.0–7.0)
Eosinophils Absolute: 0.2 10*3/uL (ref 0.0–0.5)
HCT: 38.1 % (ref 34.8–46.6)
HGB: 12.9 g/dL (ref 11.6–15.9)
LYMPH%: 24.9 % (ref 14.0–49.7)
MCH: 30.3 pg (ref 25.1–34.0)
MCHC: 33.9 g/dL (ref 31.5–36.0)
MCV: 89.4 fL (ref 79.5–101.0)
MONO#: 0.6 10*3/uL (ref 0.1–0.9)
MONO%: 6.6 % (ref 0.0–14.0)
NEUT%: 66.3 % (ref 38.4–76.8)
NEUTROS ABS: 5.5 10*3/uL (ref 1.5–6.5)
PLATELETS: 224 10*3/uL (ref 145–400)
RBC: 4.26 10*6/uL (ref 3.70–5.45)
RDW: 14 % (ref 11.2–14.5)
WBC: 8.4 10*3/uL (ref 3.9–10.3)
lymph#: 2.1 10*3/uL (ref 0.9–3.3)

## 2015-07-07 LAB — COMPREHENSIVE METABOLIC PANEL
ALT: 21 U/L (ref 0–55)
ANION GAP: 7 meq/L (ref 3–11)
AST: 15 U/L (ref 5–34)
Albumin: 3.5 g/dL (ref 3.5–5.0)
Alkaline Phosphatase: 57 U/L (ref 40–150)
BUN: 10.1 mg/dL (ref 7.0–26.0)
CHLORIDE: 108 meq/L (ref 98–109)
CO2: 25 meq/L (ref 22–29)
CREATININE: 0.8 mg/dL (ref 0.6–1.1)
Calcium: 8.7 mg/dL (ref 8.4–10.4)
GLUCOSE: 119 mg/dL (ref 70–140)
Potassium: 3.6 mEq/L (ref 3.5–5.1)
SODIUM: 139 meq/L (ref 136–145)
Total Bilirubin: 0.83 mg/dL (ref 0.20–1.20)
Total Protein: 6.7 g/dL (ref 6.4–8.3)

## 2015-07-07 MED ORDER — HEPARIN SOD (PORK) LOCK FLUSH 100 UNIT/ML IV SOLN
500.0000 [IU] | Freq: Once | INTRAVENOUS | Status: AC
Start: 2015-07-07 — End: 2015-07-07
  Administered 2015-07-07: 500 [IU] via INTRAVENOUS
  Filled 2015-07-07: qty 5

## 2015-07-07 MED ORDER — SODIUM CHLORIDE 0.9% FLUSH
10.0000 mL | INTRAVENOUS | Status: DC | PRN
  Administered 2015-07-07: 10 mL via INTRAVENOUS
  Filled 2015-07-07: qty 10

## 2015-07-08 ENCOUNTER — Telehealth: Payer: Self-pay

## 2015-07-08 LAB — CANCER ANTIGEN 125 (PARALLEL TESTING): CA 125: 33 U/mL (ref <35)

## 2015-07-08 LAB — CA 125: Cancer Antigen (CA) 125: 28.4 U/mL (ref 0.0–38.1)

## 2015-07-08 NOTE — Telephone Encounter (Signed)
-----   Message from Gordy Levan, MD sent at 07/08/2015 10:09 AM EST ----- Labs seen and need follow up: please let her know marker, cbc and cmet all good

## 2015-07-08 NOTE — Telephone Encounter (Signed)
lvm per Dr LL attached message. 

## 2015-07-10 ENCOUNTER — Telehealth: Payer: Self-pay

## 2015-07-10 NOTE — Telephone Encounter (Signed)
-----   Message from Gordy Levan, MD sent at 07/10/2015  3:25 PM EST ----- Labs seen and need follow up: please let her know ca 125 stable, keep apts as planned

## 2015-07-10 NOTE — Telephone Encounter (Signed)
lvm pr dr LL attached message

## 2015-07-15 ENCOUNTER — Encounter: Payer: Self-pay | Admitting: Internal Medicine

## 2015-07-15 DIAGNOSIS — T451X5A Adverse effect of antineoplastic and immunosuppressive drugs, initial encounter: Principal | ICD-10-CM

## 2015-07-15 DIAGNOSIS — G620 Drug-induced polyneuropathy: Secondary | ICD-10-CM

## 2015-07-15 NOTE — Telephone Encounter (Signed)
Done hardcopy to Corinne  

## 2015-07-20 ENCOUNTER — Ambulatory Visit (INDEPENDENT_AMBULATORY_CARE_PROVIDER_SITE_OTHER): Payer: BLUE CROSS/BLUE SHIELD | Admitting: Internal Medicine

## 2015-07-20 ENCOUNTER — Encounter: Payer: Self-pay | Admitting: Internal Medicine

## 2015-07-20 VITALS — BP 120/80 | HR 110 | Temp 98.2°F | Ht 68.0 in | Wt 294.0 lb

## 2015-07-20 DIAGNOSIS — M545 Low back pain, unspecified: Secondary | ICD-10-CM | POA: Insufficient documentation

## 2015-07-20 DIAGNOSIS — M6749 Ganglion, multiple sites: Secondary | ICD-10-CM

## 2015-07-20 DIAGNOSIS — G4710 Hypersomnia, unspecified: Secondary | ICD-10-CM

## 2015-07-20 DIAGNOSIS — G620 Drug-induced polyneuropathy: Secondary | ICD-10-CM | POA: Diagnosis not present

## 2015-07-20 DIAGNOSIS — Z0000 Encounter for general adult medical examination without abnormal findings: Secondary | ICD-10-CM

## 2015-07-20 DIAGNOSIS — T451X5A Adverse effect of antineoplastic and immunosuppressive drugs, initial encounter: Secondary | ICD-10-CM

## 2015-07-20 DIAGNOSIS — M67469 Ganglion, unspecified knee: Secondary | ICD-10-CM | POA: Insufficient documentation

## 2015-07-20 MED ORDER — VENLAFAXINE HCL ER 75 MG PO CP24: 75.0000 mg | ORAL_CAPSULE | Freq: Every day | ORAL | Status: DC

## 2015-07-20 MED ORDER — TRAMADOL HCL 50 MG PO TABS: 50.0000 mg | ORAL_TABLET | Freq: Four times a day (QID) | ORAL | Status: DC | PRN

## 2015-07-20 NOTE — Assessment & Plan Note (Signed)
I think clinical diagnosis, not responded to gabapentin per pt, ok to increase the effexor SR ot 75 qd, cont tramadol prn, also for neuro f/u, may need NCS/EMG

## 2015-07-20 NOTE — Assessment & Plan Note (Signed)
A discrete pain and issue, for pain control with tramadol as well, but also for podiatry referral, may need definitive excision ganglion cyst

## 2015-07-20 NOTE — Progress Notes (Signed)
Subjective:    Patient ID: [name], female    DOB: 1970-10-21, 45 y.o.   MRN: [id number]  HPI   Here with numerous complaints, mostly related to frustration over pain currently limiting her activity, quite upsetting, as cant seem to do yoga or other, cant lose wt, seems to keep gaining..  Mentions ongoing daytime somnolence persistent, was referred for OSA eval before but then had to undergo malignancy tx. Wt Readings from Last 3 Encounters:  07/20/15 294 lb (133.358 kg)  06/25/15 291 lb (131.997 kg)  06/17/15 286 lb 12 oz (130.069 kg)  Has several pain issues it seems, Pt c/o several months to have recurring right LBP , but no bowel or bladder change, fever, wt loss, gait change or falls but with radiation to the right buttock and a specific lower right lumbar paravertebral area of the source.    Also has clearly a localized left ankle pain and swelling area, has already been diagnosed with ganglion cyst and tx per sports med, but unforunately getting worse, now mod to severe, hard to even walk or stand.    Also right groin and knee pain, sharp, not clear are related except worse to ambulate.  Also with what she believes is neuropathic pain starting from the distal LE's with radiation proximally, though related to recent chemo induced periph neuropathy.    Has neurology f/u later this month mar 31,; symptoms seems different now than before, was tingling mostly before, now more c/w frank pain onset from about 15 min of ambulation with shopping, or walking the dog.  Current dose effexor not working well, gabapentin did not help at all.  Had GI upset with NSAID years ago,, does not want to try again.  Tramadol has helped in the past.    Also has a first time realized subq nodule to the left post distal arm prox to the wrist  Also has an ongoing left suprclavicalar lipoma mass no change recent. Past Medical History  Diagnosis Date  . Anxiety   . Endometriosis   . Ovarian cancer,  bilateral ([place]) 11/28/2014  . Thyroid disease    Past Surgical History  Procedure Laterality Date  . Abdominal hysterectomy  11/10/14    at [facility], Exp lap, supracervical hyst, BSO, infracolic omentectomy, lymphadenectomy, aortic lymph node sampling    reports that she has never smoked. She does not have any smokeless tobacco history on file. She reports that she drinks alcohol. She reports that she does not use illicit drugs. family history includes Atrial fibrillation in her father; Dementia in her paternal grandfather and paternal grandmother; Diabetes in her maternal aunt and mother; Heart attack in her maternal grandfather; Heart failure in her maternal grandmother; Hypertension in her mother. Allergies  Allergen Reactions  . Moxifloxacin Anaphylaxis    needs epinephrine shot  . Quinolones Anaphylaxis, Shortness Of Breath and Swelling  . Doxycycline Nausea And Vomiting  . Escitalopram Oxalate Other (See Comments)     fatigue  . Sulfonamide Derivatives Swelling and Rash    needs epinephrine shot--rash and lip swelling   Current Outpatient Prescriptions on File Prior to Visit  Medication Sig Dispense Refill  . albuterol (PROVENTIL HFA;VENTOLIN HFA) 108 (90 Base) MCG/ACT inhaler Inhale 2 puffs into the lungs every 6 (six) hours as needed for wheezing or shortness of breath. 1 Inhaler 2  . AMBULATORY NON FORMULARY MEDICATION Medication Name: 1 manuel massage every 2 weeks 1 each 0  . aspirin-acetaminophen-caffeine (EXCEDRIN MIGRAINE) [id number] MG per tablet  Take 2 tablets by mouth 2 (two) times daily as needed for headache or migraine.    . citalopram (CELEXA) 40 MG tablet Take 1 tablet (40 mg total) by mouth daily. (Patient taking differently: Take 20 mg by mouth daily. ) 90 tablet 3  . Cyanocobalamin (VITAMIN B-12 PO) Take 2 tablets by mouth daily.    Marland Kitchen levothyroxine (LEVOTHROID) 25 MCG tablet Take 1 tablet (25 mcg total) by mouth daily before breakfast. 90 tablet 3  . pantoprazole  (PROTONIX) 40 MG tablet Take 1 tablet twice a day for 2 weeks then 1 tablet daily. (Patient taking differently: Take 40 mg by mouth daily as needed (heartburn). ) 45 tablet 0  . sucralfate (CARAFATE) 1 G tablet Take 1 tablet (1 g total) by mouth 4 (four) times daily -  before meals and at bedtime. 120 tablet 2  . SUMAtriptan-naproxen (TREXIMET) 85-500 MG tablet Take 1 tablet by mouth every 2 (two) hours as needed for migraine. (Patient not taking: Reported on 07/20/2015) 6 tablet 0   No current facility-administered medications on file prior to visit.    Review of Systems  Constitutional: Negative for unusual diaphoresis or night sweats HENT: Negative for ringing in ear or discharge Eyes: Negative for double vision or worsening visual disturbance.  Respiratory: Negative for choking and stridor.   Gastrointestinal: Negative for vomiting or other signifcant bowel change Genitourinary: Negative for hematuria or change in urine volume.  Musculoskeletal: Negative for other MSK pain or swelling Skin: Negative for color change and worsening wound.  Neurological: Negative for tremors and numbness other than noted  Psychiatric/Behavioral: Negative for decreased concentration or agitation other than above       Objective:   Physical Exam BP 120/80 mmHg  Pulse 110  Temp(Src) 98.2 F (36.8 C) (Oral)  Ht 5\' 8"  (1.727 m)  Wt 294 lb (133.358 kg)  BMI 44.71 kg/m2  SpO2 97%  LMP 11/02/2014 VS noted,  Constitutional: Pt appears in no significant distress HENT: Head: NCAT.  Right Ear: External ear normal.  Left Ear: External ear normal.  Eyes: . Pupils are equal, round, and reactive to light. Conjunctivae and EOM are normal Neck: Normal range of motion. Neck supple.  Cardiovascular: Normal rate and regular rhythm.   Pulmonary/Chest: Effort normal and breath sounds without rales or wheezing.  Abd:  Soft, NT, ND, + BS Spine nontender except tender area right lumbar lowest  paravertebral Neurological: Pt is alert. Not confused , motor 5/5 intact, some dysethesia to distal LE's bilat Skin: Skin is warm. No rash, no LE edema, left post distal arm just prox to wrist is 1 cm subq oval nodule nontender without skin change Also left small supraclavicular lipoma like mass 1-2 cm noted Left ankle with area of swelling firm tender approx 1/2 cm to peroneal tendon area Psychiatric: Pt behavior is normal. No agitation.     Assessment & Plan:

## 2015-07-20 NOTE — Assessment & Plan Note (Signed)
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

## 2015-07-20 NOTE — Patient Instructions (Signed)
OK to increase the effexor XR to 75 mg per day for now  Please take all new medication as prescribed - the tramadol  Please continue all other medications as before, and refills have been done if requested.  Please have the pharmacy call with any other refills you may need.  Please continue your efforts at being more active, low cholesterol diet, and weight control.  You are otherwise up to date with prevention measures today.  Please keep your appointments with your specialists as you may have planned - Neurology on Mar 31  You will be contacted regarding the referral for: Podiatry, and Pulmonary  Please return in 6 months, or sooner if needed, with Lab testing done 3-5 days before

## 2015-07-20 NOTE — Assessment & Plan Note (Signed)
?   Worsening with ongoing wt gain, for pulm referral, r/o osa

## 2015-07-20 NOTE — Progress Notes (Signed)
Pre visit review using our clinic review tool, if applicable. No additional management support is needed unless otherwise documented below in the visit note. 

## 2015-07-23 ENCOUNTER — Ambulatory Visit: Payer: BLUE CROSS/BLUE SHIELD | Admitting: Family Medicine

## 2015-08-07 ENCOUNTER — Ambulatory Visit (INDEPENDENT_AMBULATORY_CARE_PROVIDER_SITE_OTHER): Payer: BLUE CROSS/BLUE SHIELD | Admitting: Neurology

## 2015-08-07 ENCOUNTER — Encounter: Payer: Self-pay | Admitting: Neurology

## 2015-08-07 VITALS — BP 104/78 | HR 98 | Ht 68.0 in | Wt 292.1 lb

## 2015-08-07 DIAGNOSIS — G620 Drug-induced polyneuropathy: Secondary | ICD-10-CM

## 2015-08-07 DIAGNOSIS — M79604 Pain in right leg: Secondary | ICD-10-CM

## 2015-08-07 DIAGNOSIS — T451X5A Adverse effect of antineoplastic and immunosuppressive drugs, initial encounter: Principal | ICD-10-CM

## 2015-08-07 DIAGNOSIS — M791 Myalgia: Secondary | ICD-10-CM

## 2015-08-07 DIAGNOSIS — M7918 Myalgia, other site: Secondary | ICD-10-CM

## 2015-08-07 MED ORDER — GABAPENTIN 600 MG PO TABS: 600.0000 mg | ORAL_TABLET | Freq: Three times a day (TID) | ORAL | Status: DC

## 2015-08-07 MED ORDER — CYCLOBENZAPRINE HCL 5 MG PO TABS: 5.0000 mg | ORAL_TABLET | Freq: Every evening | ORAL | Status: DC | PRN

## 2015-08-07 NOTE — Progress Notes (Signed)
[facility] Clinic Note - Initial Visit   Date: 08/07/2015  [name] MRN: [id number] DOB: 06/26/1970   Dear Dr. [name]:   Thank you for your kind referral of [name] for consultation of paresthesias of the hands and feet. Although her history is well known to you, please allow [place] to reiterate it for the purpose of our medical record. The patient was accompanied to the clinic by self.    History of Present Illness: [name] is a 45 y.o. right-handed Caucasian female with ovarian cancer s/p bilateral SPO and hysterectomy and chemotherapy (11/2014),  hypothyroidism, endometriosis, migraines, and anxiety presenting for evaluation of neuropathy and right leg pain.    She was diagnosed with bilateral ovarian cancer in July 2016 and underwent BSO with hysterectomy and 6-cycles of adjuvant chemotherapy with carboplatin and taxol, completed in November 2016.  Around September 2016, she began noticing numbness and tingling of the hands a feet which would initially last the week of chemo and usually resolve by the 3rd week, but this gradually progressed where symptoms became constant and did not improve.  Standing for > 20 minutes causes worsening of her pins and needles.  She has noticed toe weakness and gait instability, but denies any falls. She was taking gabapentin 300mg  TID, but did not have any benefit so stopped it.  She did not have any side effects to the medication and it was not tried at a higher dose.   She also complains of right sided low back pain which is dull and achy, worse with palpation.  She also has posterior leg pain which radiates up her leg and thigh.  She denies left sided back pain or leg pain, but does endorse paresthesias.  She had not done physical therapy.   She reports having history of arthritis of the right knee and had a meniscal tear.    Out-side paper records, electronic medical record, and images have been reviewed  where available and summarized as:  XR lumbar spine 06/15/2009:  No abnormality  Lab Results  Component Value Date   CKTOTAL 34* 12/08/2014     Lab Results  Component Value Date   CREATININE 0.8 07/07/2015     Past Medical History  Diagnosis Date  . Anxiety   . Endometriosis   . Ovarian cancer, bilateral ([place]) 11/28/2014  . Thyroid disease     Past Surgical History  Procedure Laterality Date  . Abdominal hysterectomy  11/10/14    at [facility], Exp lap, supracervical hyst, BSO, infracolic omentectomy, lymphadenectomy, aortic lymph node sampling     Medications:  Outpatient Encounter Prescriptions as of 08/07/2015  Medication Sig  . albuterol (PROVENTIL HFA;VENTOLIN HFA) 108 (90 Base) MCG/ACT inhaler Inhale 2 puffs into the lungs every 6 (six) hours as needed for wheezing or shortness of breath.  . AMBULATORY NON FORMULARY MEDICATION Medication Name: 1 manuel massage every 2 weeks  . aspirin-acetaminophen-caffeine (EXCEDRIN MIGRAINE) 250-250-65 MG per tablet Take 2 tablets by mouth 2 (two) times daily as needed for headache or migraine.  . Cyanocobalamin (VITAMIN B-12 PO) Take 2 tablets by mouth daily.  Marland Kitchen levothyroxine (LEVOTHROID) 25 MCG tablet Take 1 tablet (25 mcg total) by mouth daily before breakfast.  . pantoprazole (PROTONIX) 40 MG tablet Take 1 tablet twice a day for 2 weeks then 1 tablet daily. (Patient taking differently: Take 40 mg by mouth daily as needed (heartburn). )  . sucralfate (CARAFATE) 1 G tablet Take 1 tablet (1 g total) by mouth  4 (four) times daily -  before meals and at bedtime.  . SUMAtriptan-naproxen (TREXIMET) 85-500 MG tablet Take 1 tablet by mouth every 2 (two) hours as needed for migraine.  . traMADol (ULTRAM) 50 MG tablet Take 1 tablet (50 mg total) by mouth every 6 (six) hours as needed.  . venlafaxine XR (EFFEXOR XR) 75 MG 24 hr capsule Take 1 capsule (75 mg total) by mouth daily with breakfast.  . [DISCONTINUED] citalopram (CELEXA) 40 MG tablet Take 1  tablet (40 mg total) by mouth daily. (Patient taking differently: Take 20 mg by mouth daily. )   No facility-administered encounter medications on file as of 08/07/2015.     Allergies:  Allergies  Allergen Reactions  . Moxifloxacin Anaphylaxis    needs epinephrine shot  . Quinolones Anaphylaxis, Shortness Of Breath and Swelling  . Doxycycline Nausea And Vomiting  . Escitalopram Oxalate Other (See Comments)     fatigue  . Sulfonamide Derivatives Swelling and Rash    needs epinephrine shot--rash and lip swelling    Family History: Family History  Problem Relation Age of Onset  . Hypertension Mother   . Diabetes Mother   . Atrial fibrillation Father   . Diabetes Maternal Aunt   . Heart failure Maternal Grandmother   . Heart attack Maternal Grandfather   . Dementia Paternal Grandmother   . Dementia Paternal Grandfather     Social History: Social History  Substance Use Topics  . Smoking status: Never Smoker   . Smokeless tobacco: Never Used  . Alcohol Use: 0.0 oz/week    0 Standard drinks or equivalent per week     Comment: occassionally   Social History   Social History Narrative   Lives with boyfriend in a one story home.  Has no children.  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  Education: college.    Review of Systems:  CONSTITUTIONAL: No fevers, chills, night sweats, or weight loss.   EYES: No visual changes or eye pain ENT: No hearing changes.  No history of nose bleeds.   RESPIRATORY: No cough, wheezing and shortness of breath.   CARDIOVASCULAR: Negative for chest pain, and palpitations.   GI: Negative for abdominal discomfort, blood in stools or black stools.  No recent change in bowel habits.   GU:  No history of incontinence.   MUSCLOSKELETAL: +history of joint pain or swelling.  +myalgias.   SKIN: Negative for lesions, rash, and itching.   HEMATOLOGY/ONCOLOGY: Negative for prolonged bleeding, bruising easily, and swollen nodes.  +history of cancer.   ENDOCRINE:  Negative for cold or heat intolerance, polydipsia or goiter.   PSYCH:  No depression or anxiety symptoms.   NEURO: As Above.   Vital Signs:  BP 104/78 mmHg  Pulse 98  Ht 5\' 8"  (1.727 m)  Wt 292 lb 2 oz (132.507 kg)  BMI 44.43 kg/m2  SpO2 98%  LMP 11/02/2014   General Medical Exam:   General:  Obese, comfortable.   Eyes/ENT: see cranial nerve examination.   Neck: No masses appreciated.  Full range of motion without tenderness.  No carotid bruits. Respiratory:  Clear to auscultation, good air entry bilaterally.   Cardiac:  Regular rate and rhythm, no murmur.   Extremities:  No deformities, edema, or skin discoloration.  Skin:  No rashes or lesions.  Neurological Exam: MENTAL STATUS including orientation to time, place, person, recent and remote memory, attention span and concentration, language, and fund of knowledge is normal.  Speech is not dysarthric.  CRANIAL NERVES:  II:  No visual field defects.  Unremarkable fundi.   III-IV-VI: Pupils equal round and reactive to light.  Normal conjugate, extra-ocular eye movements in all directions of gaze.  No nystagmus.  No ptosis.   V:  Normal facial sensation.     VII:  Normal facial symmetry and movements.  VIII:  Normal hearing and vestibular function.   IX-X:  Normal palatal movement.   XI:  Normal shoulder shrug and head rotation.   XII:  Normal tongue strength and range of motion, no deviation or fasciculation.  MOTOR:  No atrophy, fasciculations or abnormal movements.  No pronator drift.  Tone is normal.    Right Upper Extremity:    Left Upper Extremity:    Deltoid  5/5   Deltoid  5/5   Biceps  5/5   Biceps  5/5   Triceps  5/5   Triceps  5/5   Wrist extensors  5/5   Wrist extensors  5/5   Wrist flexors  5/5   Wrist flexors  5/5   Finger extensors  5/5   Finger extensors  5/5   Finger flexors  5/5   Finger flexors  5/5   Dorsal interossei  5/5   Dorsal interossei  5/5   Abductor pollicis  5/5   Abductor pollicis  5/5     Tone (Ashworth scale)  0  Tone (Ashworth scale)  0   Right Lower Extremity:    Left Lower Extremity:    Hip flexors  5/5   Hip flexors  5/5   Hip extensors  5/5   Hip extensors  5/5   Knee flexors  5/5   Knee flexors  5/5   Knee extensors  5/5   Knee extensors  5/5   Dorsiflexors  5/5   Dorsiflexors  5/5   Plantarflexors  5/5   Plantarflexors  5/5   Toe extensors  5/5   Toe extensors  5/5   Toe flexors  4/5   Toe flexors  4/5   Tone (Ashworth scale)  0  Tone (Ashworth scale)  0   MSRs:   Right                                                                 Left brachioradialis 1+  brachioradialis 1+  biceps 1+  biceps 1+  triceps 1+  triceps 1+  patellar 0  Patellar 0  ankle jerk 0  ankle jerk 0  Hoffman no  Hoffman no  plantar response down  plantar response down   SENSORY:  Sensory loss involving a glove-stocking distribution bilaterally to all modalities.  Rhomberg sign is negative.  COORDINATION/GAIT: Normal finger-to- nose-finger.  Intact rapid alternating movements bilaterally. Gait is wide-based due to body habitus and antalgic.  She is slightly unsteady with tandem and stressed gait     IMPRESSION/PLAN: 1.  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  Right low back pain most likely musculoskeletal   - Start flexeril 5mg  at bedtime as needed for  leg pain   - Start physical therapy for leg stretching  3.  Bilateral ovarian cancer s/p SPO and hysterectomy (11/2014) and chemotherapy  Return to clinic in 3 months   The duration of this appointment visit was 50 minutes of face-to-face time with the patient.  Greater than 50% of this time was spent in counseling, explanation of  diagnosis, planning of further management, and coordination of care.   Thank you for allowing me to participate in patient's care.  If I can answer any additional questions, I would be pleased to do so.    Sincerely,    Dorethia Jeanmarie K. Posey Pronto, DO

## 2015-08-07 NOTE — Patient Instructions (Addendum)
1.  Gabapentin instructions:    Day 1-3:   Morning:        Noon:   Bedtime: 300 mg  Day 4-6:   Morning: 300 mg   Noon:   Bedtime: 300 mg  Day 7-9: Morning: 300 mg   Noon: 300 mg   Bedtime: 300 mg  Day 10-12:   Morning: 300 mg   Noon: 300 mg   Bedtime: 600 mg    Day 13-16:   Morning: 600 mg   Noon: 300 mg   Bedtime: 600 mg  Day 17-21: Morning: 600 mg   Noon: 600 mg   Bedtime: 600 mg  You can increase the dose every 1-2 days, if you seem to be tolerating the medication. If you develop increased sleepiness, stay at the lower dose.           2. Start flexeril 5mg  at bedtime for right leg pain  3.  Start physical therapy for right leg pain  Return to clinic in 3 months

## 2015-08-28 ENCOUNTER — Other Ambulatory Visit: Payer: Self-pay

## 2015-08-28 DIAGNOSIS — C561 Malignant neoplasm of right ovary: Secondary | ICD-10-CM

## 2015-08-28 DIAGNOSIS — C562 Malignant neoplasm of left ovary: Principal | ICD-10-CM

## 2015-08-28 DIAGNOSIS — C563 Malignant neoplasm of bilateral ovaries: Secondary | ICD-10-CM

## 2015-08-30 ENCOUNTER — Other Ambulatory Visit: Payer: Self-pay | Admitting: Oncology

## 2015-08-31 ENCOUNTER — Other Ambulatory Visit (HOSPITAL_BASED_OUTPATIENT_CLINIC_OR_DEPARTMENT_OTHER): Payer: BLUE CROSS/BLUE SHIELD

## 2015-08-31 ENCOUNTER — Telehealth: Payer: Self-pay | Admitting: Oncology

## 2015-08-31 ENCOUNTER — Ambulatory Visit (HOSPITAL_BASED_OUTPATIENT_CLINIC_OR_DEPARTMENT_OTHER): Payer: BLUE CROSS/BLUE SHIELD | Admitting: Oncology

## 2015-08-31 ENCOUNTER — Encounter: Payer: Self-pay | Admitting: Oncology

## 2015-08-31 ENCOUNTER — Ambulatory Visit (HOSPITAL_BASED_OUTPATIENT_CLINIC_OR_DEPARTMENT_OTHER): Payer: BLUE CROSS/BLUE SHIELD

## 2015-08-31 VITALS — BP 120/70 | HR 89 | Temp 98.6°F | Resp 20 | Wt 296.2 lb

## 2015-08-31 DIAGNOSIS — C562 Malignant neoplasm of left ovary: Secondary | ICD-10-CM

## 2015-08-31 DIAGNOSIS — C561 Malignant neoplasm of right ovary: Secondary | ICD-10-CM

## 2015-08-31 DIAGNOSIS — Z95828 Presence of other vascular implants and grafts: Secondary | ICD-10-CM

## 2015-08-31 DIAGNOSIS — G620 Drug-induced polyneuropathy: Secondary | ICD-10-CM

## 2015-08-31 DIAGNOSIS — Z6841 Body Mass Index (BMI) 40.0 and over, adult: Secondary | ICD-10-CM

## 2015-08-31 DIAGNOSIS — C563 Malignant neoplasm of bilateral ovaries: Secondary | ICD-10-CM

## 2015-08-31 DIAGNOSIS — R5383 Other fatigue: Secondary | ICD-10-CM

## 2015-08-31 DIAGNOSIS — E039 Hypothyroidism, unspecified: Secondary | ICD-10-CM

## 2015-08-31 DIAGNOSIS — M1990 Unspecified osteoarthritis, unspecified site: Secondary | ICD-10-CM

## 2015-08-31 DIAGNOSIS — T451X5A Adverse effect of antineoplastic and immunosuppressive drugs, initial encounter: Secondary | ICD-10-CM

## 2015-08-31 LAB — COMPREHENSIVE METABOLIC PANEL
ALT: 15 U/L (ref 0–55)
AST: 13 U/L (ref 5–34)
Albumin: 3.4 g/dL — ABNORMAL LOW (ref 3.5–5.0)
Alkaline Phosphatase: 59 U/L (ref 40–150)
Anion Gap: 10 mEq/L (ref 3–11)
BUN: 7.4 mg/dL (ref 7.0–26.0)
CHLORIDE: 106 meq/L (ref 98–109)
CO2: 24 meq/L (ref 22–29)
CREATININE: 0.8 mg/dL (ref 0.6–1.1)
Calcium: 9.2 mg/dL (ref 8.4–10.4)
EGFR: 88 mL/min/{1.73_m2} — ABNORMAL LOW (ref >90)
Glucose: 156 mg/dl — ABNORMAL HIGH (ref 70–140)
POTASSIUM: 3.5 meq/L (ref 3.5–5.1)
SODIUM: 140 meq/L (ref 136–145)
Total Bilirubin: 0.79 mg/dL (ref 0.20–1.20)
Total Protein: 6.8 g/dL (ref 6.4–8.3)

## 2015-08-31 LAB — CBC WITH DIFFERENTIAL/PLATELET
BASO%: 0.1 % (ref 0.0–2.0)
BASOS ABS: 0 10*3/uL (ref 0.0–0.1)
EOS%: 1.7 % (ref 0.0–7.0)
Eosinophils Absolute: 0.2 10*3/uL (ref 0.0–0.5)
HCT: 38.4 % (ref 34.8–46.6)
HGB: 13 g/dL (ref 11.6–15.9)
LYMPH#: 2.3 10*3/uL (ref 0.9–3.3)
LYMPH%: 23.7 % (ref 14.0–49.7)
MCH: 30.1 pg (ref 25.1–34.0)
MCHC: 33.9 g/dL (ref 31.5–36.0)
MCV: 88.9 fL (ref 79.5–101.0)
MONO#: 0.6 10*3/uL (ref 0.1–0.9)
MONO%: 6.1 % (ref 0.0–14.0)
NEUT#: 6.5 10*3/uL (ref 1.5–6.5)
NEUT%: 68.4 % (ref 38.4–76.8)
Platelets: 260 10*3/uL (ref 145–400)
RBC: 4.32 10*6/uL (ref 3.70–5.45)
RDW: 12.7 % (ref 11.2–14.5)
WBC: 9.5 10*3/uL (ref 3.9–10.3)

## 2015-08-31 MED ORDER — SODIUM CHLORIDE 0.9% FLUSH
10.0000 mL | INTRAVENOUS | Status: DC | PRN
  Administered 2015-08-31: 10 mL via INTRAVENOUS
  Filled 2015-08-31: qty 10

## 2015-08-31 MED ORDER — HEPARIN SOD (PORK) LOCK FLUSH 100 UNIT/ML IV SOLN
500.0000 [IU] | Freq: Once | INTRAVENOUS | Status: AC
  Administered 2015-08-31: 500 [IU] via INTRAVENOUS
  Filled 2015-08-31: qty 5

## 2015-08-31 NOTE — Telephone Encounter (Signed)
appt made and avs printed °

## 2015-08-31 NOTE — Patient Instructions (Signed)

## 2015-08-31 NOTE — Progress Notes (Signed)
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 Last imaging was CT AP 04-20-15. She saw Dr Thurston Pounds on 07-14-15, unremarkable exam and recommended alternating visits with med onc and gyn onc every 3 months, also following CA 125. Dr Thurston Pounds recommended removing PAC, however patient prefers to keep this for now due to extremely difficult peripheral IV access. Patient requests following with gyn onc in [place] rather than traveling to [facility].   Patient complains of fatigue by end of work day and for past 3 days particularly. She recently returned from a week long trip to [place] with boyfriend and his children, theme parks etc, which may have contributed to recent fatigue. She denies specific SOB or chest pain. Sleep study is planned 09-08-15 for possible sleep apnea, does snore.  She has not been doing any regular exercise and does not seem to be addressing diet. Peripheral neuropathy from taxol is unchanged in feet, symptoms better with gabapentin 600 mg tid (could contribute to fatigue). She is not using flexeril, still uses some tramadol for the neuropathy (may contribule to fatigue). She has some hot flashes on present effexor. She complains of swelling in ankles by end of work day, does try to elevate a little at desk. Bowels are moving regularly. No N/V. No problems with PAC. No bleeding. Bladder ok.  No recent fever or symptoms of infection. Remainder of 10 point Review of Systems negative/ unchanged.    PAC placed by IR 12-04-14, flushed today without difficulty. Patient prefers to keep this for now due to extremely difficult peripheral IV access. Genetics testing sent 12-17-14 normal ([employer]) Pre op CA 125 554 FLu  vaccine 03-26-15  ONCOLOGIC HISTORY Patient had history of endometriosis and infertility, with no abnormal PAP smears but no recent gyn exams. She was in usual health until a few weeks of intermittent low grade nausea, then <24 hours of progressively more severe right upper quadrant abdominal pain. She presented to [facility] ED with the abdominal pain on 11-09-14, with abdominal US not remarkable, then CT AP demonstrated bilateral solid and cystic ovarian masses in total measuring 14 x 10 x 11 cm. She was transferred to [facility] Dr Thurston Pounds, with preoperative CA 125 of 554. On 11-10-14 she had exploratory laparotomy with supracervical hysterectomy, BSO, infracolic omentectomy, bilateral pelvic lymphadenectomy and aortic node sampling. 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 . Surgical pathology from [facility] [phone]) from 11-10-14 had grade 3 clear cell carcinoma in 6 cm right ovary and 11 cm left ovary with capsules intact, bilateral fallopian tubes not involved, uterus and omentum not involved, no LVSI, for stage IB. No malignant cells in ascites or peritoneal washings. Her postoperative course was uncomplicated. [facility] multidisciplinary conference recommended 6 cycles of adjuvant taxol carboplatin. She will have 6 week post op visit with Dr Thurston Pounds. First carbo taxol was given 6-38-75, complicated by taxol reaction despite full premedication, and severe taxol aches. She was neutropenic on day 14 cycle 3 with ANC 0.6. [employer] gene panel normal, sent 12-17-14. She completed 6  cycles of carbo taxol on 03-20-15. CT AP 04-20-15 had no obvious persistent or recurrent disease.    Objective:  Vital signs in last 24 hours:  BP 120/70 mmHg   Pulse 89  Temp(Src) 98.6 F (37 C) (Oral)  Resp 20  Wt 296 lb 3.2 oz (134.355 kg)  SpO2 99%  LMP 11/02/2014 BMI  43.7 Weight is up 10 lbs from 06-03-15.  Alert, oriented and appropriate. Ambulatory with some difficulty due to morbid obesity, able to get on and off exam table.  No alopecia No apparent hot flashes during exam.  HEENT:PERRL, sclerae not icteric. Oral mucosa moist without lesions, posterior pharynx clear.  Neck supple. No JVD.  Lymphatics:no cervical,supraclavicular, axillary or inguinal adenopathy Resp: clear to auscultation bilaterally and normal percussion bilaterally Cardio: regular rate and rhythm. No gallop. GI: abdomen obese, soft, nontender, not distended, cannot appreciate mass or organomegaly. Normally active bowel sounds. Surgical incision not remarkable. Musculoskeletal/ Extremities: without pitting edema, cords, tenderness Neuro: peripheral neuropathy feet as noted. Otherwise nonfocal. PSYCH appropriate mood and affect Skin without rash, ecchymosis, petechiae Portacath-without erythema or tenderness  Lab Results:  Results for orders placed or performed in visit on 08/31/15  CBC with Differential  Result Value Ref Range   WBC 9.5 3.9 - 10.3 10e3/uL   NEUT# 6.5 1.5 - 6.5 10e3/uL   HGB 13.0 11.6 - 15.9 g/dL   HCT 38.4 34.8 - 46.6 %   Platelets 260 145 - 400 10e3/uL   MCV 88.9 79.5 - 101.0 fL   MCH 30.1 25.1 - 34.0 pg   MCHC 33.9 31.5 - 36.0 g/dL   RBC 4.32 3.70 - 5.45 10e6/uL   RDW 12.7 11.2 - 14.5 %   lymph# 2.3 0.9 - 3.3 10e3/uL   MONO# 0.6 0.1 - 0.9 10e3/uL   Eosinophils Absolute 0.2 0.0 - 0.5 10e3/uL   Basophils Absolute 0.0 0.0 - 0.1 10e3/uL   NEUT% 68.4 38.4 - 76.8 %   LYMPH% 23.7 14.0 - 49.7 %   MONO% 6.1 0.0 - 14.0 %   EOS% 1.7 0.0 - 7.0 %   BASO% 0.1 0.0 - 2.0 %  Comprehensive metabolic panel  Result Value Ref Range   Sodium 140 136 - 145 mEq/L   Potassium 3.5 3.5 - 5.1 mEq/L   Chloride 106 98 - 109 mEq/L   CO2 24 22 - 29 mEq/L    Glucose 156 (H) 70 - 140 mg/dl   BUN 7.4 7.0 - 26.0 mg/dL   Creatinine 0.8 0.6 - 1.1 mg/dL   Total Bilirubin 0.79 0.20 - 1.20 mg/dL   Alkaline Phosphatase 59 40 - 150 U/L   AST 13 5 - 34 U/L   ALT 15 0 - 55 U/L   Total Protein 6.8 6.4 - 8.3 g/dL   Albumin 3.4 (L) 3.5 - 5.0 g/dL   Calcium 9.2 8.4 - 10.4 mg/dL   Anion Gap 10 3 - 11 mEq/L   EGFR 88 (L) >90 ml/min/1.73 m2  CA 125  Result Value Ref Range   Cancer Antigen (CA) 125 26.6 0.0 - 38.1 U/mL     Studies/Results:  No results found.  Medications: I have reviewed the patient's current medications, discussing medications that could cause drowsiness.  DISCUSSION Clinically nothing obviously of concern for recurrent cancer, including by Dr Lucianne Lei Le's exam in March.  Discussed importance of losing weight / getting to ideal weight from standpoint of gyn cancers and other cancers, as well as being helpful for fatigue and other medical  problems. Patient and boyfriend were not aware that obesity is risk factor for cancers, thought likely to include ovarian as well as established risk factor for endometrial.  Recommended water exercise programs as these tend to be beneficial for peripheral neuropathy and tolerable with degenerative arthritis; boyfriend is familiar with [facility] and is supportive of this recommendation. Will try to set up with new Healthy Eating classes by [facility] nutritionist. (Unfortunately stage IB does not qualify for GOG 0225 diet and exercise study.)  Discussed follow up alternating med onc and gyn onc, all of which she requests in [place] if possible, aware that Dr Thurston Pounds does not come to [facility]. Timing of med onc may vary a little to coordinate with PAC flushes.  Discussed keeping PAC for now as peripheral access very difficult. She understands that there is chance of infection or other complications from indwelling catheter.   Fatigue likely is multifactorial. Blood counts would not be the problem  now, as these are back in good range. Sleep apnea may be a factor, as well as morbid obesity, deconditioning, recent vacation. Encouraged her to begin regular exercise program to address weight and deconditioning. Note several potentially sedating meds as above.    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 CA 125 in normal range and NED by scans early Dec. I will see her coordinating with PAC flush + labs in June, then will need gyn onc in Sept if stable.  2.   Fatigue: likely multifactorial. For sleep study. Morbidly obese, deconditioned. Sedating medications. Counts all in good range by CBC today. Needs to start regular exercise and address weight 3.PAC in. Will keep in for now, flush every 6-8 weeks when not otherwise used.  4. 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 Suggest water exercise program, as this can be very helpful for feet.  Continues gabapentin 5.degenerative arthritis: needs weight loss 6.surgical menopause: hot flashes still intermittent but perhaps not as bothersome, on celexa and gabapentin. I agree with gyn oncologist recommendation not to use exogenous estrogen.  7. Flu vaccine 03-2015 8.hypothyroidism: to follow up with PCP, on synthroid 9.morbid obesity, BMI 43.7 Doubt able to bike with peripheral neuropathy now. Encouraged water exercise and addressing diet.  10.hx migraines, no problem with zofran  11.anaphylaxis to avelox, rash to sulfa. Tolerates keflex without difficulty 12.possible slight lymphedema LE related to pelvic surgery, not obvious on my exam now. May need referral to lymphedema PT. Has used occasional low dose HCTZ 13. Degenerative arthritis right knee, known to Dr Veverly Fells.  All questions answered. Time spent 25 min including  >50% counseling and coordination of care. Route PCP, cc Dr Jodie Echevaria, MD   09/02/2015, 11:57 AM

## 2015-09-01 ENCOUNTER — Telehealth: Payer: Self-pay

## 2015-09-01 LAB — CA 125: Cancer Antigen (CA) 125: 26.6 U/mL (ref 0.0–38.1)

## 2015-09-01 NOTE — Telephone Encounter (Signed)
LM in vm regarding the results of CA-125 as noted below by Dr. Marko Plume. [name] can call Dr. Mariana Kaufman office if she has any questions or concerns.

## 2015-09-01 NOTE — Telephone Encounter (Signed)
-----   Message from Gordy Levan, MD sent at 09/01/2015  8:03 AM EDT ----- Labs seen and need follow up: please let her know marker good at 26

## 2015-09-02 ENCOUNTER — Other Ambulatory Visit: Payer: Self-pay | Admitting: Oncology

## 2015-09-04 ENCOUNTER — Emergency Department (HOSPITAL_COMMUNITY)
Admission: EM | Admit: 2015-09-04 | Discharge: 2015-09-04 | Disposition: A | Discharge status: Home / Self Care | Payer: BLUE CROSS/BLUE SHIELD | Attending: Emergency Medicine | Admitting: Emergency Medicine

## 2015-09-04 ENCOUNTER — Emergency Department (HOSPITAL_COMMUNITY): Payer: BLUE CROSS/BLUE SHIELD

## 2015-09-04 ENCOUNTER — Encounter (HOSPITAL_COMMUNITY): Payer: Self-pay | Admitting: Emergency Medicine

## 2015-09-04 ENCOUNTER — Telehealth: Payer: Self-pay | Admitting: Internal Medicine

## 2015-09-04 DIAGNOSIS — R531 Weakness: Secondary | ICD-10-CM | POA: Insufficient documentation

## 2015-09-04 DIAGNOSIS — R0602 Shortness of breath: Secondary | ICD-10-CM | POA: Diagnosis not present

## 2015-09-04 DIAGNOSIS — Z791 Long term (current) use of non-steroidal anti-inflammatories (NSAID): Secondary | ICD-10-CM | POA: Insufficient documentation

## 2015-09-04 DIAGNOSIS — Z7982 Long term (current) use of aspirin: Secondary | ICD-10-CM | POA: Diagnosis not present

## 2015-09-04 DIAGNOSIS — Z79891 Long term (current) use of opiate analgesic: Secondary | ICD-10-CM | POA: Diagnosis not present

## 2015-09-04 DIAGNOSIS — E079 Disorder of thyroid, unspecified: Secondary | ICD-10-CM | POA: Diagnosis not present

## 2015-09-04 DIAGNOSIS — Z7951 Long term (current) use of inhaled steroids: Secondary | ICD-10-CM | POA: Insufficient documentation

## 2015-09-04 DIAGNOSIS — Z8543 Personal history of malignant neoplasm of ovary: Secondary | ICD-10-CM | POA: Insufficient documentation

## 2015-09-04 DIAGNOSIS — R5383 Other fatigue: Secondary | ICD-10-CM | POA: Diagnosis not present

## 2015-09-04 DIAGNOSIS — Z79899 Other long term (current) drug therapy: Secondary | ICD-10-CM | POA: Diagnosis not present

## 2015-09-04 LAB — CBC WITH DIFFERENTIAL/PLATELET
BASOS ABS: 0 10*3/uL (ref 0.0–0.1)
BASOS PCT: 0 %
EOS ABS: 0.2 10*3/uL (ref 0.0–0.7)
Eosinophils Relative: 2 %
HEMATOCRIT: 39.4 % (ref 36.0–46.0)
Hemoglobin: 13 g/dL (ref 12.0–15.0)
Lymphocytes Relative: 23 %
Lymphs Abs: 2.1 10*3/uL (ref 0.7–4.0)
MCH: 29.3 pg (ref 26.0–34.0)
MCHC: 33 g/dL (ref 30.0–36.0)
MCV: 88.9 fL (ref 78.0–100.0)
MONO ABS: 0.5 10*3/uL (ref 0.1–1.0)
MONOS PCT: 5 %
NEUTROS ABS: 6.3 10*3/uL (ref 1.7–7.7)
Neutrophils Relative %: 70 %
PLATELETS: 272 10*3/uL (ref 150–400)
RBC: 4.43 MIL/uL (ref 3.87–5.11)
RDW: 12.8 % (ref 11.5–15.5)
WBC: 9 10*3/uL (ref 4.0–10.5)

## 2015-09-04 LAB — COMPREHENSIVE METABOLIC PANEL
ALBUMIN: 4 g/dL (ref 3.5–5.0)
ALT: 26 U/L (ref 14–54)
ANION GAP: 10 (ref 5–15)
AST: 24 U/L (ref 15–41)
Alkaline Phosphatase: 59 U/L (ref 38–126)
BUN: 9 mg/dL (ref 6–20)
CHLORIDE: 103 mmol/L (ref 101–111)
CO2: 26 mmol/L (ref 22–32)
Calcium: 9.3 mg/dL (ref 8.9–10.3)
Creatinine, Ser: 0.73 mg/dL (ref 0.44–1.00)
GFR calc Af Amer: >60 mL/min (ref >60)
GFR calc non Af Amer: >60 mL/min (ref >60)
GLUCOSE: 113 mg/dL — AB (ref 65–99)
POTASSIUM: 3.5 mmol/L (ref 3.5–5.1)
SODIUM: 139 mmol/L (ref 135–145)
Total Bilirubin: 0.9 mg/dL (ref 0.3–1.2)
Total Protein: 7.3 g/dL (ref 6.5–8.1)

## 2015-09-04 LAB — BRAIN NATRIURETIC PEPTIDE: B Natriuretic Peptide: 21.2 pg/mL (ref 0.0–100.0)

## 2015-09-04 LAB — D-DIMER, QUANTITATIVE: D-Dimer, Quant: 0.29 ug/mL-FEU (ref 0.00–0.50)

## 2015-09-04 LAB — TSH: TSH: 1.877 u[IU]/mL (ref 0.350–4.500)

## 2015-09-04 LAB — T4, FREE: FREE T4: 0.74 ng/dL (ref 0.61–1.12)

## 2015-09-04 MED ORDER — SODIUM CHLORIDE 0.9 % IV BOLUS (SEPSIS)
1000.0000 mL | Freq: Once | INTRAVENOUS | Status: AC
  Administered 2015-09-04: 1000 mL via INTRAVENOUS

## 2015-09-04 NOTE — ED Notes (Signed)
Bed: WA06 Expected date:  Expected time:  Means of arrival:  Comments: 

## 2015-09-04 NOTE — ED Provider Notes (Signed)
CSN: [id number]     Arrival date & time 09/04/15  1032 History   First MD Initiated Contact with Patient 09/04/15 1102     Chief Complaint  Patient presents with  . Weakness     (Consider location/radiation/quality/duration/timing/severity/associated sxs/prior Treatment) Patient is a 45 y.o. female presenting with weakness.  Weakness This is a chronic problem. The problem occurs constantly. The problem has been gradually worsening. Pertinent negatives include no chest pain, no abdominal pain, no headaches and no shortness of breath. Nothing aggravates the symptoms. Nothing relieves the symptoms. She has tried nothing for the symptoms. The treatment provided no relief.    Past Medical History  Diagnosis Date  . Anxiety   . Endometriosis   . Ovarian cancer, bilateral ([place]) 11/28/2014  . Thyroid disease    Past Surgical History  Procedure Laterality Date  . Abdominal hysterectomy  11/10/14    at [facility], Exp lap, supracervical hyst, BSO, infracolic omentectomy, lymphadenectomy, aortic lymph node sampling   Family History  Problem Relation Age of Onset  . Hypertension Mother   . Diabetes Mother   . Atrial fibrillation Father     Living  . Diabetes Maternal Aunt   . Heart failure Maternal Grandmother   . Heart attack Maternal Grandfather   . Dementia Paternal Grandmother   . Dementia Paternal Grandfather   . Stroke Mother     Deceased  . Healthy Brother    Social History  Substance Use Topics  . Smoking status: Never Smoker   . Smokeless tobacco: Never Used  . Alcohol Use: 0.0 oz/week    0 Standard drinks or equivalent per week     Comment: Rarely   OB History    No data available     Review of Systems  Constitutional: Positive for activity change, appetite change and fatigue. Negative for chills.  Respiratory: Negative for shortness of breath.   Cardiovascular: Negative for chest pain.  Gastrointestinal: Negative for abdominal pain.  Neurological: Positive for  weakness. Negative for headaches.  All other systems reviewed and are negative.     Allergies  Moxifloxacin; Quinolones; Doxycycline; Escitalopram oxalate; and Sulfonamide derivatives  Home Medications   Prior to Admission medications   Medication Sig Start Date End Date Taking? Authorizing Provider  aspirin-acetaminophen-caffeine (EXCEDRIN MIGRAINE) [phone] MG per tablet Take 2 tablets by mouth 2 (two) times daily as needed for headache or migraine.   Yes Historical Provider, MD  Cyanocobalamin (VITAMIN B-12 PO) Take 2 tablets by mouth daily.   Yes Historical Provider, MD  gabapentin (NEURONTIN) 600 MG tablet Take 1 tablet (600 mg total) by mouth 3 (three) times daily. 08/07/15  Yes Donika Keith Rake, DO  levothyroxine (LEVOTHROID) 25 MCG tablet Take 1 tablet (25 mcg total) by mouth daily before breakfast. 09/10/14  Yes Biagio Borg, MD  sodium chloride (OCEAN) 0.65 % SOLN nasal spray Place 1 spray into both nostrils daily as needed for congestion.   Yes Historical Provider, MD  sucralfate (CARAFATE) 1 G tablet Take 1 tablet (1 g total) by mouth 4 (four) times daily -  before meals and at bedtime. Patient taking differently: Take 1 g by mouth at bedtime.  01/22/15  Yes Lennis Marion Downer, MD  tetrahydrozoline-zinc (VISINE-AC) 0.05-0.25 % ophthalmic solution Place 2 drops into both eyes 3 (three) times daily as needed (dry eyes).   Yes Historical Provider, MD  traMADol (ULTRAM) 50 MG tablet Take 1 tablet (50 mg total) by mouth every 6 (six) hours as needed. 07/20/15  Yes Biagio Borg, MD  venlafaxine XR (EFFEXOR XR) 75 MG 24 hr capsule Take 1 capsule (75 mg total) by mouth daily with breakfast. 07/20/15  Yes Biagio Borg, MD  albuterol (PROVENTIL HFA;VENTOLIN HFA) 108 (90 Base) MCG/ACT inhaler Inhale 2 puffs into the lungs every 6 (six) hours as needed for wheezing or shortness of breath. 05/08/15   Biagio Borg, MD  AMBULATORY NON FORMULARY MEDICATION Medication Name: 1 manuel massage every 2 weeks  06/25/15   Lyndal Pulley, DO  cyclobenzaprine (FLEXERIL) 5 MG tablet Take 1 tablet (5 mg total) by mouth at bedtime as needed for muscle spasms. 08/07/15   Donika K Patel, DO  pantoprazole (PROTONIX) 40 MG tablet Take 1 tablet twice a day for 2 weeks then 1 tablet daily. Patient taking differently: Take 40 mg by mouth daily as needed (heartburn).  03/26/15   Lennis Marion Downer, MD  SUMAtriptan-naproxen (TREXIMET) 85-500 MG tablet Take 1 tablet by mouth every 2 (two) hours as needed for migraine. 06/17/15   Rubbie Battiest, NP   BP 189/86 mmHg  Pulse 106  Temp(Src) 97.4 F (36.3 C) (Oral)  Resp 18  Ht 5\' 8"  (1.727 m)  Wt 296 lb (134.265 kg)  BMI 45.02 kg/m2  SpO2 100%  LMP 11/02/2014 Physical Exam  Constitutional: She is oriented to person, place, and time. She appears well-developed and well-nourished.  HENT:  Head: Normocephalic and atraumatic.  Neck: Normal range of motion.  Cardiovascular: Regular rhythm.  Tachycardia present.   Pulmonary/Chest: No stridor. No respiratory distress. She has no wheezes. She has no rales.  Abdominal: Soft. She exhibits no distension. There is no tenderness.  Musculoskeletal: Normal range of motion. She exhibits no edema or tenderness.  Neurological: She is alert and oriented to person, place, and time.  No altered mental status, able to give full seemingly accurate history.  Face is symmetric, EOM's intact, pupils equal and reactive, vision intact, tongue and uvula midline without deviation Upper and Lower extremity motor 5/5, intact pain perception in distal extremities, 2+ reflexes in biceps, patella and achilles tendons. Finger to nose normal, heel to shin normal.   Skin: Skin is warm and dry.  Nursing note and vitals reviewed.   ED Course  Procedures (including critical care time) Labs Review Labs Reviewed  COMPREHENSIVE METABOLIC PANEL - Abnormal; Notable for the following:    Glucose, Bld 113 (*)    All other components within normal limits   TSH  T4, FREE  CBC WITH DIFFERENTIAL/PLATELET  D-DIMER, QUANTITATIVE (NOT AT [facility])  BRAIN NATRIURETIC PEPTIDE  T3    Imaging Review Dg Chest 2 View  09/04/2015  CLINICAL DATA:  New worsening shortness of breath, weakness, fatigue over the last week. History of ovarian cancer. EXAM: CHEST  2 VIEW COMPARISON:  05/07/2015 FINDINGS: Right Port-A-Cath remains in place, unchanged. Heart and mediastinal contours are within normal limits. No focal opacities or effusions. No acute bony abnormality. IMPRESSION: No active cardiopulmonary disease. Electronically Signed   By: Rolm Baptise M.D.   On: 09/04/2015 11:38   I have personally reviewed and evaluated these images and lab results as part of my medical decision-making.   EKG Interpretation   Date/Time:  Friday September 04 2015 11:45:15 EDT Ventricular Rate:  111 PR Interval:  135 QRS Duration: 81 QT Interval:  332 QTC Calculation: 451 R Axis:   87 Text Interpretation:  Ectopic atrial tachycardia, unifocal Low voltage,  precordial leads Confirmed by [facility] MD, Daune Divirgilio [phone]) on  09/04/2015  12:37:47 PM      MDM   Final diagnoses:  Weakness  Other fatigue    Worsening weakness over last week. Beyond her normal. Seen by oncologist with negative workup. Workup here negative as well. Unsure of cuase of her symptoms. Possibly cancer recurrence but no real focal area to examine. Possibly deconditioning but would not expect that to rapidly worsen either. Patient asked that a b12 be added on and her doctor would follow it up. Suspect some component of dehydration as her HR was 122 on arrival, improved to low 80's on multiple reexaminations with one liter of fluids. Will continue workup with pcp.   New Prescriptions: Discharge Medication List as of 09/04/2015  3:51 PM       I have personally and contemperaneously reviewed labs and imaging and used in my decision making as above.   A medical screening exam was performed and I feel the patient  has had an appropriate workup for their chief complaint at this time and likelihood of emergent condition existing is low and thus workup can continue on an outpatient basis.. Their vital signs are stable. They have been counseled on decision, discharge, follow up and which symptoms necessitate immediate return to the emergency department.  They verbally stated understanding and agreement with plan and discharged in stable condition.      Merrily Pew, MD 09/05/15 [phone]

## 2015-09-04 NOTE — ED Notes (Addendum)
Pt complaint of "worse weakness than normal this past week." Pt reports oncology states to "push through it, it is just post chemo," but pt states "this is worse." Pt denies related pain or nausea but reports continued chronic joint pain. Last chemo November 2016; remission.

## 2015-09-04 NOTE — Telephone Encounter (Signed)
Called and spoke to patient, advised her that Dr. [name] was booked today along with other doctors here in the facility. Advised patient that I could see if another facility would like to see her or she could come be seen in the Saturday clinic. She stated that she was going to go to the hospital.

## 2015-09-04 NOTE — ED Notes (Signed)
Nurse is going to access patient port 

## 2015-09-04 NOTE — Telephone Encounter (Signed)
Please call pt to schedule appt

## 2015-09-04 NOTE — ED Notes (Signed)
RN will collect blood via port

## 2015-09-04 NOTE — ED Notes (Signed)
RN will recollect blood

## 2015-09-04 NOTE — Telephone Encounter (Signed)
[facility]  Patient Name: [name]  DOB: January 05, 1971    Initial Comment Caller says she has had extreme fatigue. She knows that [name] is not in and would like to see another doctor today; she stayed home from work.    Nurse Assessment  Nurse: Wayne Sever, RN, Tillie Rung Date/Time (Eastern Time): 09/04/2015 9:35:48 AM  Confirm and document reason for call. If symptomatic, describe symptoms. You must click the next button to save text entered. ---Caller states she had chemo in November, she is in remission now. She is having a lot of fatigue. she states she called into work because she could not get out of bed. She states she recently had some blood work with Oncology and it was okay. She states the fatigue started on Monday.  Has the patient traveled out of the country within the last 30 days? ---No  Does the patient have any new or worsening symptoms? ---Yes  Will a triage be completed? ---Yes  Related visit to physician within the last 2 weeks? ---No  Does the PT have any chronic conditions? (i.e. diabetes, asthma, etc.) ---Yes  List chronic conditions. ---Hypothyroid, Remission from Ovarian Cancer  Is the patient pregnant or possibly pregnant? (Ask all females between the ages of 26-55) ---No  Is this a behavioral health or substance abuse call? ---No     Guidelines    Guideline Title Affirmed Question Affirmed Notes  Weakness (Generalized) and Fatigue [1] MODERATE weakness (i.e., interferes with work, school, normal activities) AND [2] cause unknown (Exceptions: weakness with acute minor illness, or weakness from poor fluid intake)    Final Disposition User   See Physician within 4 Hours (or PCP triage) Wayne Sever, RN, Tillie Rung    Comments  No appointments left at office today. Offered caller an appointment with NP at [facility] today and she did not want to see the NP, so she is going to ED   Referrals   REFERRED TO PCP OFFICE  [facility] - ED   Disagree/Comply: Comply

## 2015-09-05 LAB — T3: T3 TOTAL: 143 ng/dL (ref 71–180)

## 2015-09-08 ENCOUNTER — Ambulatory Visit (INDEPENDENT_AMBULATORY_CARE_PROVIDER_SITE_OTHER): Payer: BLUE CROSS/BLUE SHIELD | Admitting: Pulmonary Disease

## 2015-09-08 ENCOUNTER — Encounter: Payer: Self-pay | Admitting: Internal Medicine

## 2015-09-08 ENCOUNTER — Ambulatory Visit (INDEPENDENT_AMBULATORY_CARE_PROVIDER_SITE_OTHER): Payer: BLUE CROSS/BLUE SHIELD | Admitting: Internal Medicine

## 2015-09-08 ENCOUNTER — Encounter: Payer: Self-pay | Admitting: Pulmonary Disease

## 2015-09-08 VITALS — BP 128/80 | HR 106 | Temp 98.6°F | Resp 20 | Wt 298.0 lb

## 2015-09-08 VITALS — BP 118/82 | HR 117 | Ht 68.0 in | Wt 297.0 lb

## 2015-09-08 DIAGNOSIS — R609 Edema, unspecified: Secondary | ICD-10-CM

## 2015-09-08 DIAGNOSIS — R5383 Other fatigue: Secondary | ICD-10-CM | POA: Diagnosis not present

## 2015-09-08 DIAGNOSIS — J45990 Exercise induced bronchospasm: Secondary | ICD-10-CM

## 2015-09-08 DIAGNOSIS — R0609 Other forms of dyspnea: Secondary | ICD-10-CM | POA: Diagnosis not present

## 2015-09-08 DIAGNOSIS — H6692 Otitis media, unspecified, left ear: Secondary | ICD-10-CM

## 2015-09-08 DIAGNOSIS — R000 Tachycardia, unspecified: Secondary | ICD-10-CM

## 2015-09-08 DIAGNOSIS — G4710 Hypersomnia, unspecified: Secondary | ICD-10-CM

## 2015-09-08 DIAGNOSIS — R0600 Dyspnea, unspecified: Secondary | ICD-10-CM | POA: Insufficient documentation

## 2015-09-08 DIAGNOSIS — J45909 Unspecified asthma, uncomplicated: Secondary | ICD-10-CM | POA: Insufficient documentation

## 2015-09-08 HISTORY — DX: Exercise induced bronchospasm: J45.990

## 2015-09-08 MED ORDER — HYDROCHLOROTHIAZIDE 12.5 MG PO CAPS: 12.5000 mg | ORAL_CAPSULE | Freq: Every day | ORAL | Status: DC

## 2015-09-08 MED ORDER — AMOXICILLIN 500 MG PO CAPS: 1000.0000 mg | ORAL_CAPSULE | Freq: Two times a day (BID) | ORAL | Status: DC

## 2015-09-08 NOTE — Assessment & Plan Note (Signed)
SOB with ADLs. Worse recently. Might be related to weight gain. May need 2decho, PFTs, ABG if persistent and worse.

## 2015-09-08 NOTE — Assessment & Plan Note (Signed)
Weight reduction 

## 2015-09-08 NOTE — Progress Notes (Signed)
Subjective:    Patient ID: [name], female    DOB: 1970/10/26, 46 y.o.   MRN: [id number]  HPI  Here to f/u c/o unusual fatigue she cannot figure out for 1 wk, Pt denies chest pain, increased sob or doe, wheezing, orthopnea, PND, palpitations, dizziness or syncope, but has mild tachycardia today, feels somewhat nervous.  Has had some pedal edema for several wks for some reason, though denies increased po fluids.  Does have left ear pain/pressure with ? Low grade temp for 1 wk, but no sinus congestion, ST or cough as she usually has.  No prior significant allergy symptoms.  Pt denies new neurological symptoms such as new headache, or facial or extremity weakness or numbness   Pt denies polydipsia, polyuria,  Pt denies fever, wt loss, night sweats, loss of appetite, or other constitutional symptoms  Denies worsening depressive symptoms, suicidal ideation, or panic, Does c/o ongoing fatigue, but denies signficant daytime hypersomnolence. Past Medical History  Diagnosis Date  . Anxiety   . Endometriosis   . Ovarian cancer, bilateral ([place]) 11/28/2014  . Thyroid disease   . Exercise-induced asthma 09/08/2015   Past Surgical History  Procedure Laterality Date  . Abdominal hysterectomy  11/10/14    at [facility], Exp lap, supracervical hyst, BSO, infracolic omentectomy, lymphadenectomy, aortic lymph node sampling    reports that she has never smoked. She has never used smokeless tobacco. She reports that she drinks alcohol. She reports that she does not use illicit drugs. family history includes Atrial fibrillation in her father; Dementia in her paternal grandfather and paternal grandmother; Diabetes in her maternal aunt and mother; Healthy in her brother; Heart attack in her maternal grandfather; Heart failure in her maternal grandmother; Hypertension in her mother; Stroke in her mother. Allergies  Allergen Reactions  . Moxifloxacin Anaphylaxis    needs epinephrine shot  . Quinolones Anaphylaxis,  Shortness Of Breath and Swelling  . Doxycycline Nausea And Vomiting  . Escitalopram Oxalate Other (See Comments)     fatigue  . Sulfonamide Derivatives Swelling and Rash    needs epinephrine shot--rash and lip swelling   Current Outpatient Prescriptions on File Prior to Visit  Medication Sig Dispense Refill  . albuterol (PROVENTIL HFA;VENTOLIN HFA) 108 (90 Base) MCG/ACT inhaler Inhale 2 puffs into the lungs every 6 (six) hours as needed for wheezing or shortness of breath. 1 Inhaler 2  . AMBULATORY NON FORMULARY MEDICATION Medication Name: 1 manuel massage every 2 weeks 1 each 0  . aspirin-acetaminophen-caffeine (EXCEDRIN MIGRAINE) [id number] MG per tablet Take 2 tablets by mouth 2 (two) times daily as needed for headache or migraine.    . Cyanocobalamin (VITAMIN B-12 PO) Take 2 tablets by mouth daily.    . cyclobenzaprine (FLEXERIL) 5 MG tablet Take 1 tablet (5 mg total) by mouth at bedtime as needed for muscle spasms. 20 tablet 3  . gabapentin (NEURONTIN) 600 MG tablet Take 1 tablet (600 mg total) by mouth 3 (three) times daily. 90 tablet 5  . levothyroxine (LEVOTHROID) 25 MCG tablet Take 1 tablet (25 mcg total) by mouth daily before breakfast. 90 tablet 3  . pantoprazole (PROTONIX) 40 MG tablet Take 1 tablet twice a day for 2 weeks then 1 tablet daily. (Patient taking differently: Take 40 mg by mouth daily as needed (heartburn). ) 45 tablet 0  . sodium chloride (OCEAN) 0.65 % SOLN nasal spray Place 1 spray into both nostrils daily as needed for congestion.    . sucralfate (CARAFATE) 1 G tablet  Take 1 tablet (1 g total) by mouth 4 (four) times daily -  before meals and at bedtime. (Patient taking differently: Take 1 g by mouth at bedtime. ) 120 tablet 2  . SUMAtriptan-naproxen (TREXIMET) 85-500 MG tablet Take 1 tablet by mouth every 2 (two) hours as needed for migraine. 6 tablet 0  . tetrahydrozoline-zinc (VISINE-AC) 0.05-0.25 % ophthalmic solution Place 2 drops into both eyes 3 (three) times  daily as needed (dry eyes).    . traMADol (ULTRAM) 50 MG tablet Take 1 tablet (50 mg total) by mouth every 6 (six) hours as needed. 60 tablet 1  . venlafaxine XR (EFFEXOR XR) 75 MG 24 hr capsule Take 1 capsule (75 mg total) by mouth daily with breakfast. 90 capsule 3   No current facility-administered medications on file prior to visit.   Review of Systems  Constitutional: Negative for unusual diaphoresis or night sweats HENT: Negative for ear swelling or discharge Eyes: Negative for worsening visual haziness  Respiratory: Negative for choking and stridor.   Gastrointestinal: Negative for distension or worsening eructation Genitourinary: Negative for retention or change in urine volume.  Musculoskeletal: Negative for other MSK pain or swelling Skin: Negative for color change and worsening wound Neurological: Negative for tremors and numbness other than noted  Psychiatric/Behavioral: Negative for decreased concentration or agitation other than above       Objective:   Physical Exam BP 128/80 mmHg  Pulse 106  Temp(Src) 98.6 F (37 C) (Oral)  Resp 20  Wt 298 lb (135.172 kg)  SpO2 96%  LMP 11/02/2014 VS noted,  Constitutional: Pt appears in no apparent distress HENT: Head: NCAT.  Right Ear: External ear normal.  Left Ear: External ear normal. with TM mod erythema, mild bulging Eyes: . Pupils are equal, round, and reactive to light. Conjunctivae and EOM are normal Neck: Normal range of motion. Neck supple.  Cardiovascular: Normal rate and regular rhythm.   Pulmonary/Chest: Effort normal and breath sounds without rales or wheezing.  Neurological: Pt is alert. Not confused , motor grossly intact Skin: Skin is warm. No rash, no LE edema Psychiatric: Pt behavior is normal. No agitation.         Assessment & Plan:

## 2015-09-08 NOTE — Progress Notes (Signed)
Subjective:    Patient ID: [name], female    DOB: [birth year], 45 y.o.   MRN: [id number]  HPI   This is the case of [name], 45 y.o. Female, who was referred by Dr. [name] in consultation regarding possible OSA.   As you very well know, patient is a non smoker.  Not been dxed with copd. Pt with exercise induced asthma > stable.   Pt was dxed with Ovarian CA July 2016.Had surgery and chemotherapy. Got fatigued after chemo.  Has gained weight after chemo.   Pt has snoring. Denies witnessed apneas.  Occasional  gasping or choking.  Ends up sleeping 5-6 hrs/night. Has issues falling and staying asleep. Has frequent awakenings. Tosses and turns.  Wakes up unrefreshed.  Has fatigue/hypersomnia.  Gets sleepy at work. Hypersomnia affects fxnality.  (-) abnormal behavior in sleep.           Review of Systems  Constitutional: Negative.  Negative for fever and unexpected weight change.       Gained 10 lbs x 3 mos  HENT: Positive for congestion, ear pain, postnasal drip, sinus pressure and sore throat. Negative for dental problem, nosebleeds, rhinorrhea, sneezing and trouble swallowing.   Eyes: Negative.  Negative for redness and itching.  Respiratory: Positive for shortness of breath and wheezing. Negative for cough and chest tightness.   Cardiovascular: Positive for leg swelling. Negative for palpitations.  Gastrointestinal: Negative.  Negative for nausea and vomiting.  Endocrine: Negative.   Genitourinary: Negative.  Negative for dysuria.  Musculoskeletal: Positive for arthralgias. Negative for joint swelling.  Skin: Negative.  Negative for rash.  Allergic/Immunologic: Positive for environmental allergies.  Neurological: Positive for dizziness and headaches.  Hematological: Negative.  Does not bruise/bleed easily.  Psychiatric/Behavioral: Negative.  Negative for dysphoric mood. The patient is not nervous/anxious.   All other systems reviewed and are  negative.  Past Medical History  Diagnosis Date  . Anxiety   . Endometriosis   . Ovarian cancer, bilateral ([place]) 11/28/2014  . Thyroid disease   . Exercise-induced asthma 09/08/2015   (-) DVT, CVA, CAD  Family History  Problem Relation Age of Onset  . Hypertension Mother   . Diabetes Mother   . Atrial fibrillation Father     Living  . Diabetes Maternal Aunt   . Heart failure Maternal Grandmother   . Heart attack Maternal Grandfather   . Dementia Paternal Grandmother   . Dementia Paternal Grandfather   . Stroke Mother     Deceased  . Healthy Brother      Past Surgical History  Procedure Laterality Date  . Abdominal hysterectomy  11/10/14    at [facility], Exp lap, supracervical hyst, BSO, infracolic omentectomy, lymphadenectomy, aortic lymph node sampling    Social History   Social History  . Marital Status: Divorced    Spouse Name: N/A  . Number of Children: 0  . Years of Education: N/A   Occupational History  . Not on file.   Social History Main Topics  . Smoking status: Never Smoker   . Smokeless tobacco: Never Used  . Alcohol Use: 0.0 oz/week    0 Standard drinks or equivalent per week     Comment: Rarely  . Drug Use: No  . Sexual Activity: Not on file   Other Topics Concern  . Not on file   Social History Narrative   Lives with boyfriend in a one story home.  Has no children.     Works in Herbalist  at [employer].     Education: college.     Allergies  Allergen Reactions  . Moxifloxacin Anaphylaxis    needs epinephrine shot  . Quinolones Anaphylaxis, Shortness Of Breath and Swelling  . Doxycycline Nausea And Vomiting  . Escitalopram Oxalate Other (See Comments)     fatigue  . Sulfonamide Derivatives Swelling and Rash    needs epinephrine shot--rash and lip swelling     Outpatient Prescriptions Prior to Visit  Medication Sig Dispense Refill  . albuterol (PROVENTIL HFA;VENTOLIN HFA) 108 (90 Base) MCG/ACT inhaler Inhale 2 puffs into the lungs every 6 (six)  hours as needed for wheezing or shortness of breath. 1 Inhaler 2  . AMBULATORY NON FORMULARY MEDICATION Medication Name: 1 manuel massage every 2 weeks 1 each 0  . aspirin-acetaminophen-caffeine (EXCEDRIN MIGRAINE) [id number] MG per tablet Take 2 tablets by mouth 2 (two) times daily as needed for headache or migraine.    . Cyanocobalamin (VITAMIN B-12 PO) Take 2 tablets by mouth daily.    . cyclobenzaprine (FLEXERIL) 5 MG tablet Take 1 tablet (5 mg total) by mouth at bedtime as needed for muscle spasms. 20 tablet 3  . gabapentin (NEURONTIN) 600 MG tablet Take 1 tablet (600 mg total) by mouth 3 (three) times daily. 90 tablet 5  . levothyroxine (LEVOTHROID) 25 MCG tablet Take 1 tablet (25 mcg total) by mouth daily before breakfast. 90 tablet 3  . pantoprazole (PROTONIX) 40 MG tablet Take 1 tablet twice a day for 2 weeks then 1 tablet daily. (Patient taking differently: Take 40 mg by mouth daily as needed (heartburn). ) 45 tablet 0  . sodium chloride (OCEAN) 0.65 % SOLN nasal spray Place 1 spray into both nostrils daily as needed for congestion.    . sucralfate (CARAFATE) 1 G tablet Take 1 tablet (1 g total) by mouth 4 (four) times daily -  before meals and at bedtime. (Patient taking differently: Take 1 g by mouth at bedtime. ) 120 tablet 2  . SUMAtriptan-naproxen (TREXIMET) 85-500 MG tablet Take 1 tablet by mouth every 2 (two) hours as needed for migraine. 6 tablet 0  . tetrahydrozoline-zinc (VISINE-AC) 0.05-0.25 % ophthalmic solution Place 2 drops into both eyes 3 (three) times daily as needed (dry eyes).    . traMADol (ULTRAM) 50 MG tablet Take 1 tablet (50 mg total) by mouth every 6 (six) hours as needed. 60 tablet 1  . venlafaxine XR (EFFEXOR XR) 75 MG 24 hr capsule Take 1 capsule (75 mg total) by mouth daily with breakfast. 90 capsule 3   No facility-administered medications prior to visit.   No orders of the defined types were placed in this encounter.          Objective:   Physical  Exam Vitals:  Filed Vitals:   09/08/15 1446  BP: 118/82  Pulse: 117  Height: 5\' 8"  (1.727 m)  Weight: 297 lb (134.718 kg)  SpO2: 97%    Constitutional/General:  Pleasant, well-nourished, well-developed, not in any distress,  Comfortably seating.  Well kempt  Body mass index is 45.17 kg/(m^2). Wt Readings from Last 3 Encounters:  09/08/15 297 lb (134.718 kg)  09/04/15 296 lb (134.265 kg)  08/31/15 296 lb 3.2 oz (134.355 kg)    Neck circumference:   HEENT: Pupils equal and reactive to light and accommodation. Anicteric sclerae. Normal nasal mucosa.   No oral  lesions,  mouth clear,  oropharynx clear, no postnasal drip. (-) Oral thrush. No dental caries.  Airway - Mallampati class IV  Neck: No masses. Midline trachea. No JVD, (-) LAD. (-) bruits appreciated.  Respiratory/Chest: Grossly normal chest. (-) deformity. (-) Accessory muscle use.  Symmetric expansion. (-) Tenderness on palpation.  Resonant on percussion.  Diminished BS on both lower lung zones. (-) wheezing, crackles, rhonchi (-) egophony  Cardiovascular: Regular rate and  rhythm, heart sounds normal, no murmur or gallops, no peripheral edema  Gastrointestinal:  Normal bowel sounds. Soft, non-tender. No hepatosplenomegaly.  (-) masses.   Musculoskeletal:  Normal muscle tone. Normal gait.   Extremities: Grossly normal. (-) clubbing, cyanosis.  (-) edema  Skin: (-) rash,lesions seen.   Neurological/Psychiatric : alert, oriented to time, place, person. Normal mood and affect             Assessment & Plan:  Hypersomnia Pt has snoring. Denies witnessed apneas.  Occasional  gasping or choking.  Ends up sleeping 5-6 hrs/night. Has issues falling and staying asleep. Has frequent awakenings. Tosses and turns.  Wakes up unrefreshed.  Has fatigue/hypersomnia.  Gets sleepy at work. Hypersomnia affects fxnality.  (-) abnormal behavior in sleep.  Plan : 1. Plan for HST. Likely with moderate to severe  osa. 2. Anticipate no issues with cpap. 3. Plan to start autocpap. May need bipap. 4. Sleep hygiene.   Exercise-induced asthma Stable. Albuterol prior to exercise.   OBESITY, MORBID Weight reduction  Exertional dyspnea SOB with ADLs. Worse recently. Might be related to weight gain. May need 2decho, PFTs, ABG if persistent and worse.     Thank you very much for letting me participate in this patient's care. Please do not hesitate to give me a call if you have any questions or concerns regarding the treatment plan.   Patient will follow up with me in [place], sooner if with issues.     Monica Becton, MD 09/08/2015   3:27 PM [facility] [place] Pager: [phone] Office: [phone], Fax: [phone]

## 2015-09-08 NOTE — Patient Instructions (Signed)
Please take all new medication as prescribed - the antibiotic, and the fluid pill  Please continue all other medications as before, and refills have been done if requested.  Please have the pharmacy call with any other refills you may need.  Please keep your appointments with your specialists as you may have planned  You will be contacted regarding the referral for: echocardiogram

## 2015-09-08 NOTE — Progress Notes (Signed)
Pre visit review using our clinic review tool, if applicable. No additional management support is needed unless otherwise documented below in the visit note. 

## 2015-09-08 NOTE — Patient Instructions (Signed)
1. We'll schedule you for a home sleep study. We will call you with results of that. If you have sleep apnea, we will order you an auto CPAP. 2. Give [place] a call if you having issues with CPAP.  Return to clinic in August

## 2015-09-08 NOTE — Assessment & Plan Note (Signed)
Pt has snoring. Denies witnessed apneas.  Occasional  gasping or choking.  Ends up sleeping 5-6 hrs/night. Has issues falling and staying asleep. Has frequent awakenings. Tosses and turns.  Wakes up unrefreshed.  Has fatigue/hypersomnia.  Gets sleepy at work. Hypersomnia affects fxnality.  (-) abnormal behavior in sleep.  Plan : 1. Plan for HST. Likely with moderate to severe osa. 2. Anticipate no issues with cpap. 3. Plan to start autocpap. May need bipap. 4. Sleep hygiene.

## 2015-09-08 NOTE — Assessment & Plan Note (Signed)
Stable. Albuterol prior to exercise.

## 2015-09-13 DIAGNOSIS — R5383 Other fatigue: Secondary | ICD-10-CM | POA: Insufficient documentation

## 2015-09-13 NOTE — Assessment & Plan Note (Signed)
With tachycardia today, for echo, for hct 12.5 qd

## 2015-09-13 NOTE — Assessment & Plan Note (Signed)
Mild to mod, for antibx course,  to f/u any worsening symptoms or concerns 

## 2015-09-13 NOTE — Assessment & Plan Note (Signed)
Mild, suspect related to left ear infection, recent data reviewed, for further tx and eval as above Lab Results  Component Value Date   WBC 9.0 09/04/2015   HGB 13.0 09/04/2015   HCT 39.4 09/04/2015   PLT 272 09/04/2015   GLUCOSE 113* 09/04/2015   ALT 26 09/04/2015   AST 24 09/04/2015   NA 139 09/04/2015   K 3.5 09/04/2015   CL 103 09/04/2015   CREATININE 0.73 09/04/2015   BUN 9 09/04/2015   CO2 26 09/04/2015   TSH 1.877 09/04/2015   INR 0.99 12/04/2014

## 2015-09-14 ENCOUNTER — Ambulatory Visit (HOSPITAL_COMMUNITY): Payer: BLUE CROSS/BLUE SHIELD | Attending: Internal Medicine

## 2015-09-14 ENCOUNTER — Other Ambulatory Visit: Payer: Self-pay

## 2015-09-14 DIAGNOSIS — R002 Palpitations: Secondary | ICD-10-CM | POA: Diagnosis not present

## 2015-09-14 DIAGNOSIS — Z6841 Body Mass Index (BMI) 40.0 and over, adult: Secondary | ICD-10-CM | POA: Insufficient documentation

## 2015-09-14 DIAGNOSIS — R609 Edema, unspecified: Secondary | ICD-10-CM | POA: Insufficient documentation

## 2015-09-14 DIAGNOSIS — R000 Tachycardia, unspecified: Secondary | ICD-10-CM

## 2015-09-21 DIAGNOSIS — G4733 Obstructive sleep apnea (adult) (pediatric): Secondary | ICD-10-CM | POA: Diagnosis not present

## 2015-09-24 ENCOUNTER — Telehealth: Payer: Self-pay | Admitting: Pulmonary Disease

## 2015-09-24 DIAGNOSIS — G4733 Obstructive sleep apnea (adult) (pediatric): Secondary | ICD-10-CM

## 2015-09-24 NOTE — Telephone Encounter (Signed)
  Please call the pt and tell the pt the [place]  showed OSA - moderate  Pt stops breathing 21   times an hour.   Please order autoCPAP 5-15 cm H2O. Patient will need a mask fitting session. Patient will need a 1 month download.    Pt wants to use advanced home care.   Patient needs to be seen by me or any of the NPs/APPs  4-6 weeks after obtaining the cpap machine. Let me know if you receive this.   Thanks!   J. Shirl Harris, MD 09/24/2015, 4:27 PM

## 2015-09-25 ENCOUNTER — Other Ambulatory Visit: Payer: Self-pay | Admitting: *Deleted

## 2015-09-25 DIAGNOSIS — G4710 Hypersomnia, unspecified: Secondary | ICD-10-CM

## 2015-09-25 DIAGNOSIS — G4733 Obstructive sleep apnea (adult) (pediatric): Secondary | ICD-10-CM | POA: Diagnosis not present

## 2015-09-28 NOTE — Telephone Encounter (Signed)
Spoke with pt and gave results and recommendations. Pt agrees to start CPAP therapy. Order placed. Pt aware to call office and schedule f/u appt once starts CPAP. Nothing further needed.  

## 2015-09-30 ENCOUNTER — Other Ambulatory Visit: Payer: Self-pay | Admitting: Internal Medicine

## 2015-09-30 ENCOUNTER — Encounter: Payer: Self-pay | Admitting: Internal Medicine

## 2015-09-30 MED ORDER — ALPRAZOLAM 0.5 MG PO TBDP: 0.5000 mg | ORAL_TABLET | Freq: Two times a day (BID) | ORAL | Status: DC | PRN

## 2015-09-30 MED ORDER — ALPRAZOLAM 1 MG PO TABS: 0.5000 mg | ORAL_TABLET | Freq: Two times a day (BID) | ORAL | Status: DC | PRN

## 2015-09-30 NOTE — Telephone Encounter (Signed)
Done hardcopy to Corinne  

## 2015-10-02 ENCOUNTER — Ambulatory Visit (INDEPENDENT_AMBULATORY_CARE_PROVIDER_SITE_OTHER): Payer: BLUE CROSS/BLUE SHIELD

## 2015-10-02 ENCOUNTER — Ambulatory Visit (INDEPENDENT_AMBULATORY_CARE_PROVIDER_SITE_OTHER): Payer: BLUE CROSS/BLUE SHIELD | Admitting: Podiatry

## 2015-10-02 ENCOUNTER — Encounter: Payer: Self-pay | Admitting: Podiatry

## 2015-10-02 VITALS — BP 126/77 | HR 104

## 2015-10-02 DIAGNOSIS — R52 Pain, unspecified: Secondary | ICD-10-CM | POA: Diagnosis not present

## 2015-10-02 DIAGNOSIS — M67472 Ganglion, left ankle and foot: Secondary | ICD-10-CM | POA: Diagnosis not present

## 2015-10-02 DIAGNOSIS — G5752 Tarsal tunnel syndrome, left lower limb: Secondary | ICD-10-CM

## 2015-10-02 DIAGNOSIS — M25572 Pain in left ankle and joints of left foot: Secondary | ICD-10-CM

## 2015-10-02 DIAGNOSIS — M779 Enthesopathy, unspecified: Secondary | ICD-10-CM

## 2015-10-02 MED ORDER — DICLOFENAC SODIUM 75 MG PO TBEC: 75.0000 mg | DELAYED_RELEASE_TABLET | Freq: Two times a day (BID) | ORAL | Status: DC

## 2015-10-02 NOTE — Patient Instructions (Signed)
Ganglion Cyst  A ganglion cyst is a noncancerous, fluid-filled lump that occurs near joints or tendons. The ganglion cyst grows out of a joint or the lining of a tendon. It most often develops in the hand or wrist, but it can also develop in the shoulder, elbow, hip, knee, ankle, or foot. The round or oval ganglion cyst can be the size of a pea or larger than a grape. Increased activity may enlarge the size of the cyst because more fluid starts to build up.   CAUSES  It is not known what causes a ganglion cyst to grow. However, it may be related to:  · Inflammation or irritation around the joint.  · An injury.  · Repetitive movements or overuse.  · Arthritis.  RISK FACTORS  Risk factors include:  · Being a woman.  · Being age 20-50.  SIGNS AND SYMPTOMS  Symptoms may include:   · A lump. This most often appears on the hand or wrist, but it can occur in other areas of the body.  · Tingling.  · Pain.  · Numbness.  · Muscle weakness.  · Weak grip.  · Less movement in a joint.  DIAGNOSIS  Ganglion cysts are most often diagnosed based on a physical exam. Your health care provider will feel the lump and may shine a light alongside it. If it is a ganglion cyst, a light often shines through it. Your health care provider may order an X-ray, ultrasound, or MRI to rule out other conditions.  TREATMENT  Ganglion cysts usually go away on their own without treatment. If pain or other symptoms are involved, treatment may be needed. Treatment is also needed if the ganglion cyst limits your movement or if it gets infected. Treatment may include:  · Wearing a brace or splint on your wrist or finger.  · Taking anti-inflammatory medicine.  · Draining fluid from the lump with a needle (aspiration).  · Injecting a steroid into the joint.  · Surgery to remove the ganglion cyst.  HOME CARE INSTRUCTIONS  · Do not press on the ganglion cyst, poke it with a needle, or hit it.  · Take medicines only as directed by your health care  provider.  · Wear your brace or splint as directed by your health care provider.  · Watch your ganglion cyst for any changes.  · Keep all follow-up visits as directed by your health care provider. This is important.  SEEK MEDICAL CARE IF:  · Your ganglion cyst becomes larger or more painful.  · You have increased redness, red streaks, or swelling.  · You have pus coming from the lump.  · You have weakness or numbness in the affected area.  · You have a fever or chills.     This information is not intended to replace advice given to you by your health care provider. Make sure you discuss any questions you have with your health care provider.     Document Released: 04/22/2000 Document Revised: 05/16/2014 Document Reviewed: 10/08/2013  Elsevier Interactive Patient Education ©2016 [employer]

## 2015-10-06 DIAGNOSIS — M67472 Ganglion, left ankle and foot: Secondary | ICD-10-CM | POA: Insufficient documentation

## 2015-10-06 DIAGNOSIS — M779 Enthesopathy, unspecified: Secondary | ICD-10-CM | POA: Insufficient documentation

## 2015-10-06 NOTE — Progress Notes (Signed)
Subjective:     Patient ID: [name], female   DOB: 1970/09/03, 45 y.o.   MRN: [id number]  HPI 45 year old female presents to the office for concerns of left ankle pain and a cyst on the outside of her ankle. This has been ongoing for several weeks. She states she she turns her foot side to side she feels as if something is "slipping". She points to the lateral sinus tarsi With the majority of her pain is localized. She also points the lateral aspect of the ankle for which she feels a cyst. No recent injury that she can recall. No redness or warmth. No recent treatment. No other complaints.  Review of Systems  All other systems reviewed and are negative.      Objective:   Physical Exam General: AAO x3, NAD  Dermatological: Skin is warm, dry and supple bilateral. Nails x 10 are well manicured; remaining integument appears unremarkable at this time. There are no open sores, no preulcerative lesions, no rash or signs of infection present.  Vascular: Dorsalis Pedis artery and Posterior Tibial artery pedal pulses are 2/4 bilateral with immedate capillary fill time. Pedal hair growth present. No varicosities and no lower extremity edema present bilateral. There is no pain with calf compression, swelling, warmth, erythema.   Neruologic: Grossly intact via light touch bilateral. Vibratory intact via tuning fork bilateral. Protective threshold with Semmes Wienstein monofilament intact to all pedal sites bilateral. Patellar and Achilles deep tendon reflexes 2+ bilateral. No Babinski or clonus noted bilateral.   Musculoskeletal:There is mild crepitation with subtalar joint range of motion. There is tenderness on lateral aspect of the sinus tarsi. There does appear to be some edema overlying this area. There is also a fluid-filled mobile soft tissue mass present the lateral aspect of the foot adjacent to the sinus tarsi and inferior to the lateral malleolus. There is no overlying erythema or increase  in warmth. MMT 5/5.  Gait: Unassisted, Nonantalgic.      Assessment:     Subtalar joint arthritis, sinus tarsi syndrome/capsulitis; likely ganglion cyst    Plan:     -Treatment options discussed including all alternatives, risks, and complications -Etiology of symptoms were discussed -X-rays were obtained and reviewed with the patient. No evidence of acute fracture. -Discussed steroid injection for the cyst as well as for capsulitis. Under same risks she wishes to proceed. Under sterile conditions a mixture of Kenalog and local anesthetic was infiltrated into the cyst and into the sinus tarsi without complications. Post injection care was discussed. -Tri-Lock ankle brace -Voltaren. Discussed side effects the medicine. -Discussed shoe gear modifications and orthotics. -Follow-up as scheduled. Call if questions concerned the meantime.  Celesta Gentile, DPM

## 2015-10-07 MED ORDER — LEVOTHYROXINE SODIUM 50 MCG PO TABS: 50.0000 ug | ORAL_TABLET | Freq: Every day | ORAL | Status: DC

## 2015-10-07 NOTE — Telephone Encounter (Signed)
Ok to increase the levothyr to 50  Lexapro is not normally taken in addition to the effexor

## 2015-10-18 ENCOUNTER — Other Ambulatory Visit: Payer: Self-pay | Admitting: Oncology

## 2015-10-19 ENCOUNTER — Ambulatory Visit: Payer: BLUE CROSS/BLUE SHIELD | Admitting: Oncology

## 2015-10-19 ENCOUNTER — Other Ambulatory Visit: Payer: Self-pay | Admitting: Oncology

## 2015-10-19 ENCOUNTER — Other Ambulatory Visit: Payer: BLUE CROSS/BLUE SHIELD

## 2015-10-19 DIAGNOSIS — C562 Malignant neoplasm of left ovary: Principal | ICD-10-CM

## 2015-10-19 DIAGNOSIS — C561 Malignant neoplasm of right ovary: Secondary | ICD-10-CM

## 2015-10-19 DIAGNOSIS — C563 Malignant neoplasm of bilateral ovaries: Secondary | ICD-10-CM

## 2015-10-23 ENCOUNTER — Telehealth: Payer: Self-pay | Admitting: Oncology

## 2015-10-23 NOTE — Telephone Encounter (Signed)
R/s pt missed appt to 6/29 at 1015 am per pt message 6/16. Unable to leave message on vm due to mailbox being full

## 2015-11-01 ENCOUNTER — Other Ambulatory Visit: Payer: Self-pay | Admitting: Oncology

## 2015-11-04 ENCOUNTER — Other Ambulatory Visit: Payer: Self-pay | Admitting: *Deleted

## 2015-11-04 DIAGNOSIS — C562 Malignant neoplasm of left ovary: Principal | ICD-10-CM

## 2015-11-04 DIAGNOSIS — C563 Malignant neoplasm of bilateral ovaries: Secondary | ICD-10-CM

## 2015-11-04 DIAGNOSIS — C561 Malignant neoplasm of right ovary: Secondary | ICD-10-CM

## 2015-11-05 ENCOUNTER — Encounter: Payer: Self-pay | Admitting: Oncology

## 2015-11-05 ENCOUNTER — Telehealth: Payer: Self-pay | Admitting: Oncology

## 2015-11-05 ENCOUNTER — Ambulatory Visit (HOSPITAL_BASED_OUTPATIENT_CLINIC_OR_DEPARTMENT_OTHER): Payer: BLUE CROSS/BLUE SHIELD

## 2015-11-05 ENCOUNTER — Ambulatory Visit: Payer: BLUE CROSS/BLUE SHIELD

## 2015-11-05 ENCOUNTER — Ambulatory Visit (HOSPITAL_BASED_OUTPATIENT_CLINIC_OR_DEPARTMENT_OTHER): Payer: BLUE CROSS/BLUE SHIELD | Admitting: Oncology

## 2015-11-05 ENCOUNTER — Other Ambulatory Visit: Payer: BLUE CROSS/BLUE SHIELD

## 2015-11-05 VITALS — BP 126/81 | HR 86 | Temp 98.4°F | Resp 18 | Ht 68.0 in | Wt 297.2 lb

## 2015-11-05 VITALS — BP 131/72 | HR 79 | Temp 98.3°F | Resp 18

## 2015-11-05 DIAGNOSIS — C561 Malignant neoplasm of right ovary: Secondary | ICD-10-CM | POA: Diagnosis not present

## 2015-11-05 DIAGNOSIS — C563 Malignant neoplasm of bilateral ovaries: Secondary | ICD-10-CM

## 2015-11-05 DIAGNOSIS — Z95828 Presence of other vascular implants and grafts: Secondary | ICD-10-CM

## 2015-11-05 DIAGNOSIS — R3915 Urgency of urination: Secondary | ICD-10-CM

## 2015-11-05 DIAGNOSIS — C562 Malignant neoplasm of left ovary: Secondary | ICD-10-CM

## 2015-11-05 DIAGNOSIS — R5383 Other fatigue: Secondary | ICD-10-CM

## 2015-11-05 DIAGNOSIS — M1990 Unspecified osteoarthritis, unspecified site: Secondary | ICD-10-CM

## 2015-11-05 DIAGNOSIS — E039 Hypothyroidism, unspecified: Secondary | ICD-10-CM

## 2015-11-05 DIAGNOSIS — Z6841 Body Mass Index (BMI) 40.0 and over, adult: Secondary | ICD-10-CM

## 2015-11-05 DIAGNOSIS — T451X5A Adverse effect of antineoplastic and immunosuppressive drugs, initial encounter: Secondary | ICD-10-CM

## 2015-11-05 DIAGNOSIS — G620 Drug-induced polyneuropathy: Secondary | ICD-10-CM

## 2015-11-05 MED ORDER — HEPARIN SOD (PORK) LOCK FLUSH 100 UNIT/ML IV SOLN
500.0000 [IU] | Freq: Once | INTRAVENOUS | Status: AC
  Administered 2015-11-05: 500 [IU]
  Filled 2015-11-05: qty 5

## 2015-11-05 MED ORDER — SODIUM CHLORIDE 0.9 % IJ SOLN
10.0000 mL | Freq: Once | INTRAMUSCULAR | Status: AC
  Administered 2015-11-05: 10 mL
  Filled 2015-11-05: qty 10

## 2015-11-05 NOTE — Telephone Encounter (Signed)
appt made and avs printed. Pt didi not want to return to the lab today per LL 6/29 pof. LL aware pt refused

## 2015-11-05 NOTE — Progress Notes (Signed)
OFFICE PROGRESS NOTE   November 07, 2015   Physicians: De Nurse, (H.Mezer), S.Norris, Narda Amber, [place] deDios (pulm).  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 Last CT AP was 04-20-15 and she saw Dr Thurston Pounds at [facility] on 07-14-15. Patient requests to be followed now by gyn oncology in [place], set up to see Dr Denman George 01-29-16.   Patient has several concerns, but overall seems to be feeling better and stronger than at my visit in April. She began [place] program 3-4 wks ago, biked 14 miles in past week and has been to [employer]. Dr Posey Pronto increased gabapentin to 600 mg tid, which has helped neuropathy in feet and is not causing excessive drowsiness now. She is using CPAP for OSA diagnosed by Dr deDios. She was seen in ED in 08-2015 for atrial tachycardia (TFTs normal) and had left otitis treated by Dr [name] in May. Echocardiogram 09-14-15 EF 60-65%. She complains of some bladder and bowel urgency, but refuses urine tests today. She is emotional at times, wonders if antidepressant needs adjusting (that by PCP). She complains of soreness at lower joints of thumbs and hips at times. She is working full time. She denies fever or other clear symptoms of UTI. She denies bleeding. Bowels are moving regularly. No LE swelling. No problems with PAC. Cyst on ankle being addressed by podiatry Remainder of 10 point Review of Systems negative    PAC placed by IR 12-04-14, flushed today without difficulty. Patient prefers to keep this for now due to extremely difficult peripheral IV access. Genetics testing sent 12-17-14 normal ([employer]) Pre op CA 125 554 FLu vaccine 03-26-15  ONCOLOGIC HISTORY Patient had history of endometriosis and infertility, with no abnormal PAP smears but no recent gyn exams. She was  in usual health until a few weeks of intermittent low grade nausea, then <24 hours of progressively more severe right upper quadrant abdominal pain. She presented to [facility] ED with the abdominal pain on 11-09-14, with abdominal US not remarkable, then CT AP demonstrated bilateral solid and cystic ovarian masses in total measuring 14 x 10 x 11 cm. She was transferred to [facility] Dr Thurston Pounds, with preoperative CA 125 of 554. On 11-10-14 she had exploratory laparotomy with supracervical hysterectomy, BSO, infracolic omentectomy, bilateral pelvic lymphadenectomy and aortic node sampling. 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 . Surgical pathology from [facility] [phone]) from 11-10-14 had grade 3 clear cell carcinoma in 6 cm right ovary and 11 cm left ovary with capsules intact, bilateral fallopian tubes not involved, uterus and omentum not involved, no LVSI, for stage IB. No malignant cells in ascites or peritoneal washings. Her postoperative course was uncomplicated. [facility] multidisciplinary conference recommended 6 cycles of adjuvant taxol carboplatin. She will have 6 week post op visit with Dr Thurston Pounds. First carbo taxol was given [id number], complicated by taxol reaction despite full premedication, and severe taxol aches. She was neutropenic on day 14 cycle 3 with ANC 0.6. [employer] gene panel normal, sent 12-17-14. She completed 6 cycles of carbo taxol on 03-20-15. CT AP 04-20-15 had no obvious persistent or recurrent disease.  Objective:  Vital signs in last 24 hours:  BP 126/81 mmHg  Pulse 86  Temp(Src) 98.4 F (36.9 C) (Oral)  Resp 18  Ht 5\' 8"  (1.727 m)  Wt 297 lb 3.2 oz (134.809 kg)  BMI 45.20 kg/m2  SpO2 98%  LMP  11/02/2014 Weight up 1 lb Alert, oriented and appropriate. Ambulatory without difficulty, able to get on and off exam table easily No alopecia  HEENT:PERRL, sclerae not icteric. Oral mucosa moist without lesions, posterior pharynx clear.  Neck supple. No JVD.  Lymphatics:no cervical,supraclavicular, axillary or inguinal adenopathy Resp: clear to auscultation bilaterally and normal percussion bilaterally Cardio: regular rate and rhythm. No gallop. GI: abdomen obese, soft, nontender, not obviously distended, cannot appreciate mass or organomegaly. Normally active bowel sounds. Surgical incision not remarkable. Musculoskeletal/ Extremities: without pitting edema, cords, tenderness. No erythema or swelling joints of hands Neuro: peripheral neuropathy as noted. Otherwise nonfocal Skin without rash, ecchymosis, petechiae Portacath-without erythema or tenderness  Lab Results:  Results for orders placed or performed in visit on 11/05/15  CA 125  Result Value Ref Range   Cancer Antigen (CA) 125 26.3 0.0 - 38.1 U/mL     Studies/Results:  No results found.  Medications: I have reviewed the patient's current medications.  DISCUSSION Interval history reviewed.  We will let her know results of CA 125 as noted, this again reassuring so do not feel repeat CT necessary at present.  Appreciate all care by other MDs involved.  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 and 6 cycles carboplatin taxol from 12-05-14 thru 03-20-15. CA 125 in normal range and NED by scans early Dec. I will see her coordinating with PAC flush + labs in June, then will need gyn onc in Sept if stable.  2. Fatigue multifactorial and improved since I saw her in April. Counts all in good range by CBC today. Has improved exercise and understands that she should address weight. Using CPAP 3.PAC in. Will keep in for now, flush every 6-8 weeks when not otherwise  used.  4. 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 Suggest water exercise program, as this can be very helpful for feet. Present gabapentin dose helpful, has follow up with Dr Posey Pronto 5.degenerative arthritis: needs weight loss 6.surgical menopause: hot flashes still intermittent but perhaps not as bothersome, on celexa and gabapentin. I agree with gyn oncologist recommendation not to use exogenous estrogen.  7. hypothyroidism: to follow up with PCP, on synthroid 8.morbid obesity as above 9.hx migraines, no problem with zofran  10.anaphylaxis to avelox, rash to sulfa. Tolerates keflex without difficulty 11.possible slight lymphedema LE related to pelvic surgery, not obvious on my exam now. May need referral to lymphedema PT. Has used occasional low dose HCTZ   All questions answered and patient knows to call if needed prior to next scheduled visit, wihich will be with Dr Denman George in Sept (+PAC flushes). Time spent 20 min including >50% counseling and coordination of care. Route PCP. In [facility] for neurology and pulmonary. Cc Dr Lucianne Lei Aloha Gell, MD   11/07/2015, 4:07 PM

## 2015-11-05 NOTE — Telephone Encounter (Signed)
appt made and avs rinted

## 2015-11-06 ENCOUNTER — Telehealth: Payer: Self-pay

## 2015-11-06 ENCOUNTER — Ambulatory Visit (INDEPENDENT_AMBULATORY_CARE_PROVIDER_SITE_OTHER): Payer: BLUE CROSS/BLUE SHIELD | Admitting: Podiatry

## 2015-11-06 ENCOUNTER — Ambulatory Visit (INDEPENDENT_AMBULATORY_CARE_PROVIDER_SITE_OTHER): Payer: BLUE CROSS/BLUE SHIELD | Admitting: Neurology

## 2015-11-06 ENCOUNTER — Encounter: Payer: Self-pay | Admitting: Neurology

## 2015-11-06 ENCOUNTER — Encounter: Payer: Self-pay | Admitting: Podiatry

## 2015-11-06 VITALS — BP 110/70 | HR 90 | Ht 68.0 in | Wt 297.4 lb

## 2015-11-06 VITALS — BP 124/66 | HR 72 | Resp 12

## 2015-11-06 DIAGNOSIS — G5752 Tarsal tunnel syndrome, left lower limb: Secondary | ICD-10-CM

## 2015-11-06 DIAGNOSIS — M779 Enthesopathy, unspecified: Secondary | ICD-10-CM

## 2015-11-06 DIAGNOSIS — M19072 Primary osteoarthritis, left ankle and foot: Secondary | ICD-10-CM | POA: Diagnosis not present

## 2015-11-06 DIAGNOSIS — G620 Drug-induced polyneuropathy: Secondary | ICD-10-CM | POA: Diagnosis not present

## 2015-11-06 DIAGNOSIS — M7918 Myalgia, other site: Secondary | ICD-10-CM

## 2015-11-06 DIAGNOSIS — M67472 Ganglion, left ankle and foot: Secondary | ICD-10-CM

## 2015-11-06 DIAGNOSIS — M25572 Pain in left ankle and joints of left foot: Secondary | ICD-10-CM

## 2015-11-06 DIAGNOSIS — T451X5A Adverse effect of antineoplastic and immunosuppressive drugs, initial encounter: Principal | ICD-10-CM

## 2015-11-06 DIAGNOSIS — M791 Myalgia: Secondary | ICD-10-CM | POA: Diagnosis not present

## 2015-11-06 LAB — CA 125: Cancer Antigen (CA) 125: 26.3 U/mL (ref 0.0–38.1)

## 2015-11-06 MED ORDER — TRAMADOL HCL 50 MG PO TABS: 50.0000 mg | ORAL_TABLET | Freq: Three times a day (TID) | ORAL | Status: DC | PRN

## 2015-11-06 MED ORDER — CYCLOBENZAPRINE HCL 5 MG PO TABS: 5.0000 mg | ORAL_TABLET | Freq: Two times a day (BID) | ORAL | Status: DC

## 2015-11-06 MED ORDER — DICLOFENAC SODIUM 75 MG PO TBEC: 75.0000 mg | DELAYED_RELEASE_TABLET | Freq: Two times a day (BID) | ORAL | Status: DC

## 2015-11-06 MED ORDER — LIDOCAINE-PRILOCAINE 2.5-2.5 % EX CREA: TOPICAL_CREAM | CUTANEOUS | Status: DC

## 2015-11-06 NOTE — Telephone Encounter (Signed)
lvm per Dr LL attached message. 

## 2015-11-06 NOTE — Patient Instructions (Addendum)
1.  NCS/EMG of the upper extremities 2.  Increase gabapentin to 900mg  at bedtime and continue gabapentin 600mg  in the morning and afternoon 3.  Increase flexeril 5mg  twice daily  Return to clinic in 3 months

## 2015-11-06 NOTE — Progress Notes (Signed)
Follow-up Visit   Date: 11/06/2015    Vernett Kucharek MRN: [id number] DOB: November 04, 1970   Interim History: Hypatia Keniston is a 45 y.o. right-handed Caucasian female with ovarian cancer s/p bilateral SPO and hysterectomy and chemotherapy (11/2014), hypothyroidism, endometriosis, migraines, and anxiety returning to the clinic for follow-up of chemotherapy-induced neuropathy.  The patient was accompanied to the clinic by self.  History of present illness: She was diagnosed with bilateral ovarian cancer in July 2016 and underwent BSO with hysterectomy and 6-cycles of adjuvant chemotherapy with carboplatin and taxol, completed in November 2016. Around September 2016, she began noticing numbness and tingling of the hands a feet which would initially last the week of chemo and usually resolve by the 3rd week, but this gradually progressed where symptoms became constant and did not improve. Standing for > 20 minutes causes worsening of her pins and needles. She has noticed toe weakness and gait instability, but denies any falls. She was taking gabapentin 300mg  TID, but did not have any benefit so stopped it. She did not have any side effects to the medication and it was not tried at a higher dose.   She also complains of right sided low back pain which is dull and achy, worse with palpation. She also has posterior leg pain which radiates up her leg and thigh. She denies left sided back pain or leg pain, but does endorse paresthesias. She had not done physical therapy.   She reports having history of arthritis of the right knee and had a meniscal tear.   UPDATE 11/06/2015:  At her last visit, I recommended starting physical therapy and flexeril for her low back pain.  She did not go for physical therapy because of time constraints.  She feel that the flexeril helps a little, but nothing has helped her low back pain. She does not have pain that radiates into her legs.  Her gabapentin was slowly  titrated to 600mg  TID which has helped the burning pain in her feet.  She occasionally has sharp shooting intermittent pain of the feet.  She complains of bilateral thumb pain and soreness in addition to hand paresthesias.   She also has pulsating pain over her arms.     Medications:  Current Outpatient Prescriptions on File Prior to Visit  Medication Sig Dispense Refill  . albuterol (PROVENTIL HFA;VENTOLIN HFA) 108 (90 Base) MCG/ACT inhaler Inhale 2 puffs into the lungs every 6 (six) hours as needed for wheezing or shortness of breath. 1 Inhaler 2  . ALPRAZolam (XANAX) 1 MG tablet Take 0.5 tablets (0.5 mg total) by mouth 2 (two) times daily as needed for anxiety. 30 tablet 2  . AMBULATORY NON FORMULARY MEDICATION Medication Name: 1 manuel massage every 2 weeks 1 each 0  . aspirin-acetaminophen-caffeine (EXCEDRIN MIGRAINE) [id number] MG per tablet Take 2 tablets by mouth 2 (two) times daily as needed for headache or migraine.    . Cyanocobalamin (VITAMIN B-12 PO) Take 2 tablets by mouth daily.    . cyclobenzaprine (FLEXERIL) 5 MG tablet Take 1 tablet (5 mg total) by mouth at bedtime as needed for muscle spasms. 20 tablet 3  . diclofenac (VOLTAREN) 75 MG EC tablet Take 1 tablet (75 mg total) by mouth 2 (two) times daily. 30 tablet 1  . gabapentin (NEURONTIN) 600 MG tablet Take 1 tablet (600 mg total) by mouth 3 (three) times daily. 90 tablet 5  . hydrochlorothiazide (MICROZIDE) 12.5 MG capsule Take 1 capsule (12.5 mg total) by mouth daily. Hugo  capsule 3  . levothyroxine (SYNTHROID, LEVOTHROID) 50 MCG tablet Take 1 tablet (50 mcg total) by mouth daily before breakfast. 90 tablet 3  . pantoprazole (PROTONIX) 40 MG tablet Take 1 tablet twice a day for 2 weeks then 1 tablet daily. (Patient taking differently: Take 40 mg by mouth daily as needed (heartburn). ) 45 tablet 0  . sodium chloride (OCEAN) 0.65 % SOLN nasal spray Place 1 spray into both nostrils daily as needed for congestion.    . sucralfate  (CARAFATE) 1 G tablet Take 1 tablet (1 g total) by mouth 4 (four) times daily -  before meals and at bedtime. (Patient taking differently: Take 1 g by mouth at bedtime. ) 120 tablet 2  . SUMAtriptan-naproxen (TREXIMET) 85-500 MG tablet Take 1 tablet by mouth every 2 (two) hours as needed for migraine. 6 tablet 0  . tetrahydrozoline-zinc (VISINE-AC) 0.05-0.25 % ophthalmic solution Place 2 drops into both eyes 3 (three) times daily as needed (dry eyes).    . traMADol (ULTRAM) 50 MG tablet Take 1 tablet (50 mg total) by mouth every 6 (six) hours as needed. 60 tablet 1  . venlafaxine XR (EFFEXOR XR) 75 MG 24 hr capsule Take 1 capsule (75 mg total) by mouth daily with breakfast. 90 capsule 3   No current facility-administered medications on file prior to visit.    Allergies:  Allergies  Allergen Reactions  . Moxifloxacin Anaphylaxis    needs epinephrine shot  . Quinolones Anaphylaxis, Shortness Of Breath and Swelling  . Doxycycline Nausea And Vomiting  . Escitalopram Oxalate Other (See Comments)     fatigue  . Sulfonamide Derivatives Swelling and Rash    needs epinephrine shot--rash and lip swelling    Review of Systems:  CONSTITUTIONAL: No fevers, chills, night sweats, or weight loss.  EYES: No visual changes or eye pain ENT: No hearing changes.  No history of nose bleeds.   RESPIRATORY: No cough, wheezing and shortness of breath.   CARDIOVASCULAR: Negative for chest pain, and palpitations.   GI: Negative for abdominal discomfort, blood in stools or black stools.  No recent change in bowel habits.   GU:  No history of incontinence.   MUSCLOSKELETAL: +history of joint pain or swelling.  +myalgias.   SKIN: Negative for lesions, rash, and itching.   ENDOCRINE: Negative for cold or heat intolerance, polydipsia or goiter.   PSYCH:  No depression or anxiety symptoms.   NEURO: As Above.   Vital Signs:  BP 110/70 mmHg  Pulse 90  Ht 5\' 8"  (1.727 m)  Wt 297 lb 6 oz (134.888 kg)  BMI 45.23  kg/m2  SpO2 97%  LMP 11/02/2014  Neurological Exam: MENTAL STATUS including orientation to time, place, person, recent and remote memory, attention span and concentration, language, and fund of knowledge is normal.  Speech is not dysarthric.  CRANIAL NERVES:   Face is symmetric.   MOTOR:  Motor strength is 5/5 in all extremities, except toe flexion 4/5.  No atrophy, fasciculations or abnormal movements.  Tone is normal.  MSRs:  Right                                                                 Left brachioradialis 2+  brachioradialis 2+  biceps 2+  biceps  2+  triceps 1+  triceps 1+  Patellar 0  Patellar 0  ankle jerk 0  ankle jerk 0  Hoffman no  Hoffman no  plantar response down  plantar response down   SENSORY:  Sensory loss involving glove-stocking distribution bilaterally.  COORDINATION/GAIT:  Normal finger-to- nose-finger and heel-to-shin.  Intact rapid alternating movements bilaterally.  Gait wide-based and antalgic, stable.   Data: Lab Results  Component Value Date   TSH 1.877 09/04/2015    IMPRESSION/PLAN: 1. 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  Bilateral thumb pain and soreness, likely musculoskeletal and less likely associated with neuropathy, but will perform NCS/EMG to be sure there is no superimposed CTS - NCS/EMG of the upper extremities  3. Right low back pain most likely musculoskeletal  - Increase flexeril 5mg  twice daily - Patient declined physical therapy   4. Bilateral ovarian cancer s/p SPO and hysterectomy (11/2014) and chemotherapy  Return to clinic in 3 months   The duration of this appointment visit was 30 minutes of face-to-face time with the  patient.  Greater than 50% of this time was spent in counseling, explanation of diagnosis, planning of further management, and coordination of care.   Thank you for allowing me to participate in patient's care.  If I can answer any additional questions, I would be pleased to do so.    Sincerely,    Briston Lax K. Posey Pronto, DO

## 2015-11-06 NOTE — Telephone Encounter (Signed)
-----   Message from Gordy Levan, MD sent at 11/06/2015 12:42 PM EDT ----- Labs seen and need follow up  Please let her know ca 125 marker is stable in good range   Keep apts as planned

## 2015-11-06 NOTE — Telephone Encounter (Signed)
lvm that Rx sent

## 2015-11-06 NOTE — Telephone Encounter (Signed)
-----   Message from Gordy Levan, MD sent at 11/05/2015  1:44 PM EDT ----- Needs EMLA please

## 2015-11-09 ENCOUNTER — Encounter: Payer: BLUE CROSS/BLUE SHIELD | Admitting: Neurology

## 2015-11-16 ENCOUNTER — Ambulatory Visit: Payer: BLUE CROSS/BLUE SHIELD | Admitting: *Deleted

## 2015-11-16 DIAGNOSIS — M779 Enthesopathy, unspecified: Secondary | ICD-10-CM

## 2015-11-16 NOTE — Progress Notes (Signed)
Patient ID: [name], female   DOB: 10-Jan-1971, 45 y.o.   MRN: [id number] Patient presents to be casted for a brace with [facility] Certified Pedorthist.  Patient will return in 4 weeks to be fitted.

## 2015-11-16 NOTE — Progress Notes (Signed)
Patient ID: [name], female   DOB: 10-17-1970, 45 y.o.   MRN: [id number]  Subjective: 45 year old female presents the office today for follow-up evaluation of left foot/ankle pain. She said the injection last appointment did help quite a bit. She still be some discomfort. Should the majority of the time that she gets discomfort is when she has tried a lot of walking and on uneven surface. She was to get back to hiking and becoming more active. Denies any systemic complaints such as fevers, chills, nausea, vomiting. No acute changes since last appointment, and no other complaints at this time.   Objective: AAO x3, NAD DP/PT pulses palpable bilaterally, CRT less than 3 seconds This mild restriction of subtalar joint range of motion left side greater than right. Small crepitation is present with subtalar joint range of motion. There is tenderness in the last with films sinus tarsi have this does appear to be improved compared to last appointment. The cyst that was present on the lateral aspect of the foot is also decreased in size and is no tenderness upon this area. No other areas of tenderness bilateral lower extremities. No areas of pinpoint bony tenderness or pain with vibratory sensation. MMT 5/5, ROM WNL. No edema, erythema, increase in warmth to bilateral lower extremities.  No open lesions or pre-ulcerative lesions.  No pain with calf compression, swelling, warmth, erythema  Assessment: Left foot subtalar joint arthritis, capsulitis  Plan: -All treatment options discussed with the patient including all alternatives, risks, complications.  -I long discussion the patient regarding treatment options. We discussed MRI but we'll hold off on that. I believe that she may benefit from a Mezzo brace in her shoe for when she is doing a lot of walking/hiking as this is when she has the majority of her symptoms. I will have her follow up with [facility] for casting. I discussed CMO but a brace may be  more beneficial for her.  -Patient encouraged to call the office with any questions, concerns, change in symptoms.   Celesta Gentile, DPM

## 2015-11-17 LAB — HM MAMMOGRAPHY

## 2015-11-19 ENCOUNTER — Ambulatory Visit (INDEPENDENT_AMBULATORY_CARE_PROVIDER_SITE_OTHER): Payer: BLUE CROSS/BLUE SHIELD | Admitting: Internal Medicine

## 2015-11-19 ENCOUNTER — Encounter: Payer: Self-pay | Admitting: Internal Medicine

## 2015-11-19 VITALS — BP 122/72 | HR 94 | Temp 97.9°F | Resp 20 | Wt 303.0 lb

## 2015-11-19 DIAGNOSIS — F419 Anxiety disorder, unspecified: Secondary | ICD-10-CM | POA: Diagnosis not present

## 2015-11-19 DIAGNOSIS — G43001 Migraine without aura, not intractable, with status migrainosus: Secondary | ICD-10-CM

## 2015-11-19 DIAGNOSIS — R21 Rash and other nonspecific skin eruption: Secondary | ICD-10-CM

## 2015-11-19 MED ORDER — VENLAFAXINE HCL ER 150 MG PO CP24: 150.0000 mg | ORAL_CAPSULE | Freq: Every day | ORAL | Status: DC

## 2015-11-19 MED ORDER — SUMATRIPTAN-NAPROXEN SODIUM 85-500 MG PO TABS: 1.0000 | ORAL_TABLET | ORAL | Status: DC | PRN

## 2015-11-19 MED ORDER — FLUOCINONIDE 0.05 % EX CREA: 1.0000 "application " | TOPICAL_CREAM | Freq: Two times a day (BID) | CUTANEOUS | Status: DC

## 2015-11-19 MED ORDER — ALPRAZOLAM 1 MG PO TABS: 1.0000 mg | ORAL_TABLET | Freq: Two times a day (BID) | ORAL | Status: DC | PRN

## 2015-11-19 NOTE — Assessment & Plan Note (Signed)
Mild worsening, for xanax asd, also increased effexor er 150 qd,  to f/u any worsening symptoms or concerns

## 2015-11-19 NOTE — Assessment & Plan Note (Signed)
C/w dermatitis, for topical steroid crm prn,  to f/u any worsening symptoms or concerns

## 2015-11-19 NOTE — Assessment & Plan Note (Signed)
Stable, for treximet prn,  to f/u any worsening symptoms or concerns

## 2015-11-19 NOTE — Progress Notes (Signed)
Pre visit review using our clinic review tool, if applicable. No additional management support is needed unless otherwise documented below in the visit note. 

## 2015-11-19 NOTE — Progress Notes (Signed)
Subjective:    Patient ID: [name], female    DOB: 21-Mar-1971, 45 y.o.   MRN: [id number]  HPI  Here with 1 wk worsening itchy mild erythem slight raised rash to dorsal 3,4,5 fingers left hand, as well several fingers right hand, post right hand and post arm, with several excoriations to post right arm.  No pain, fever, trauma. Has occurred a few yrs ago, no recent obvious contact allergens.  Improved prior with steroid cream, nothing now makes better or worse. Has not tried OTC meds.  Pt denies chest pain, increased sob or doe, wheezing, orthopnea, PND, increased LE swelling, palpitations, dizziness or syncope.  Pt denies new neurological symptoms such as new headache, or facial or extremity weakness or numbness  Had had her typical migraine no change in freq and severity in last few wks but ran out of treximet, which worked well and would like to continue.  Denies worsening depressive symptoms, suicidal ideation, or panic; has ongoing anxiety, overall worsening recently, asks for increased meds, has mostly to do with work stress, declines need for counseling Past Medical History  Diagnosis Date  . Anxiety   . Endometriosis   . Ovarian cancer, bilateral ([place]) 11/28/2014  . Thyroid disease   . Exercise-induced asthma 09/08/2015   Past Surgical History  Procedure Laterality Date  . Abdominal hysterectomy  11/10/14    at [facility], Exp lap, supracervical hyst, BSO, infracolic omentectomy, lymphadenectomy, aortic lymph node sampling    reports that she has never smoked. She has never used smokeless tobacco. She reports that she drinks alcohol. She reports that she does not use illicit drugs. family history includes Atrial fibrillation in her father; Dementia in her paternal grandfather and paternal grandmother; Diabetes in her maternal aunt and mother; Healthy in her brother; Heart attack in her maternal grandfather; Heart failure in her maternal grandmother; Hypertension in her mother; Stroke in her  mother. Allergies  Allergen Reactions  . Moxifloxacin Anaphylaxis    needs epinephrine shot  . Quinolones Anaphylaxis, Shortness Of Breath and Swelling  . Doxycycline Nausea And Vomiting  . Escitalopram Oxalate Other (See Comments)     fatigue  . Sulfonamide Derivatives Swelling and Rash    needs epinephrine shot--rash and lip swelling   Current Outpatient Prescriptions on File Prior to Visit  Medication Sig Dispense Refill  . albuterol (PROVENTIL HFA;VENTOLIN HFA) 108 (90 Base) MCG/ACT inhaler Inhale 2 puffs into the lungs every 6 (six) hours as needed for wheezing or shortness of breath. 1 Inhaler 2  . AMBULATORY NON FORMULARY MEDICATION Medication Name: 1 manuel massage every 2 weeks 1 each 0  . aspirin-acetaminophen-caffeine (EXCEDRIN MIGRAINE) [id number] MG per tablet Take 2 tablets by mouth 2 (two) times daily as needed for headache or migraine.    . Cyanocobalamin (VITAMIN B-12 PO) Take 2 tablets by mouth daily.    . cyclobenzaprine (FLEXERIL) 5 MG tablet Take 1 tablet (5 mg total) by mouth 2 (two) times daily. 60 tablet 5  . diclofenac (VOLTAREN) 75 MG EC tablet Take 1 tablet (75 mg total) by mouth 2 (two) times daily. 30 tablet 1  . gabapentin (NEURONTIN) 600 MG tablet Take 1 tablet (600 mg total) by mouth 3 (three) times daily. 90 tablet 5  . hydrochlorothiazide (MICROZIDE) 12.5 MG capsule Take 1 capsule (12.5 mg total) by mouth daily. 90 capsule 3  . levothyroxine (SYNTHROID, LEVOTHROID) 50 MCG tablet Take 1 tablet (50 mcg total) by mouth daily before breakfast. 90 tablet 3  .  lidocaine-prilocaine (EMLA) cream Apply to port 1-2 hours before access with needle. Cover with plastic wrap. 30 g 1  . pantoprazole (PROTONIX) 40 MG tablet Take 1 tablet twice a day for 2 weeks then 1 tablet daily. (Patient taking differently: Take 40 mg by mouth daily as needed (heartburn). ) 45 tablet 0  . sodium chloride (OCEAN) 0.65 % SOLN nasal spray Place 1 spray into both nostrils daily as needed for  congestion.    . sucralfate (CARAFATE) 1 G tablet Take 1 tablet (1 g total) by mouth 4 (four) times daily -  before meals and at bedtime. (Patient taking differently: Take 1 g by mouth at bedtime. ) 120 tablet 2  . tetrahydrozoline-zinc (VISINE-AC) 0.05-0.25 % ophthalmic solution Place 2 drops into both eyes 3 (three) times daily as needed (dry eyes).    . traMADol (ULTRAM) 50 MG tablet Take 1 tablet (50 mg total) by mouth every 6 (six) hours as needed. 60 tablet 1   No current facility-administered medications on file prior to visit.   Review of Systems  Constitutional: Negative for unusual diaphoresis or night sweats HENT: Negative for ear swelling or discharge Eyes: Negative for worsening visual haziness  Respiratory: Negative for choking and stridor.   Gastrointestinal: Negative for distension or worsening eructation Genitourinary: Negative for retention or change in urine volume.  Musculoskeletal: Negative for other MSK pain or swelling Skin: Negative for color change and worsening wound Neurological: Negative for tremors and numbness other than noted  Psychiatric/Behavioral: Negative for decreased concentration or agitation other than above       Objective:   Physical Exam BP 122/72 mmHg  Pulse 94  Temp(Src) 97.9 F (36.6 C) (Oral)  Resp 20  Wt 303 lb (137.44 kg)  SpO2 96%  LMP 11/02/2014 VS noted,  Constitutional: Pt appears in no apparent distress HENT: Head: NCAT.  Right Ear: External ear normal.  Left Ear: External ear normal.  Eyes: . Pupils are equal, round, and reactive to light. Conjunctivae and EOM are normal Neck: Normal range of motion. Neck supple.  Cardiovascular: Normal rate and regular rhythm.   Pulmonary/Chest: Effort normal and breath sounds without rales or wheezing.  Abd:  Soft, NT, ND, + BS Neurological: Pt is alert. Not confused , motor grossly intact Skin: Skin is warm, no LE edema, with eczematous type rash to 3,4,5 post distal fingers, right post  hand and right post arm with several excoriations Psychiatric: Pt behavior is normal. No agitation.     Assessment & Plan:

## 2015-11-19 NOTE — Patient Instructions (Signed)
You had the steroid shot today  Please take all new medication as prescribed   - the cream  OK to increase the effexor XR to 150 mg per day, and the xanax to 1 mg at a time as needed  Please continue all other medications as before, and refills have been done if requested.  Please have the pharmacy call with any other refills you may need.  Please keep your appointments with your specialists as you may have planned

## 2015-11-24 ENCOUNTER — Ambulatory Visit (INDEPENDENT_AMBULATORY_CARE_PROVIDER_SITE_OTHER): Payer: BLUE CROSS/BLUE SHIELD | Admitting: Neurology

## 2015-11-24 ENCOUNTER — Encounter: Payer: Self-pay | Admitting: Internal Medicine

## 2015-11-24 DIAGNOSIS — G620 Drug-induced polyneuropathy: Secondary | ICD-10-CM | POA: Diagnosis not present

## 2015-11-24 DIAGNOSIS — M7918 Myalgia, other site: Secondary | ICD-10-CM

## 2015-11-24 DIAGNOSIS — G5603 Carpal tunnel syndrome, bilateral upper limbs: Secondary | ICD-10-CM

## 2015-11-24 DIAGNOSIS — T451X5A Adverse effect of antineoplastic and immunosuppressive drugs, initial encounter: Secondary | ICD-10-CM

## 2015-11-24 MED ORDER — SUMATRIPTAN SUCCINATE 100 MG PO TABS: 100.0000 mg | ORAL_TABLET | ORAL | Status: DC | PRN

## 2015-11-24 NOTE — Procedures (Signed)
[facility]  [address]  [place], [place] [address] Tel: [phone] Fax:  [phone] Test Date:  11/24/2015  Patient: [name] DOB: February 01, 1971 Physician: Narda Amber, DO  Sex: Female Height: 5\' 8"  Ref Phys: Narda Amber, DO  ID#: [id number] Temp: 34.8C Technician: Jerilynn Mages. Dean   Patient Complaints: This is a 45 year old female with chemotherapy-induced neuropathy of the feet referred for evaluation of bilateral hand paresthesias and weakness.  NCV & EMG Findings: Extensive electrodiagnostic testing of the right upper extremity and additional studies of the left shows: 1. Bilateral median sensory responses show prolonged latency (R4.0, L4.0 ms) and normal amplitude. Bilateral ulnar sensory responses are within normal limits. 2. Bilateral median motor responses show prolonged latency (R4.5, L4.4 ms) and normal amplitude. Bilateral ulnar motor responses are within normal limits. 3. There is no evidence of active or chronic motor axon loss changes affecting any of the tested muscles. Motor unit configuration and recruitment pattern is within normal limits.  Impression: Bilateral median neuropathy at or distal to the wrist, consistent with clinical diagnosis of carpal tunnel syndrome. Overall, these findings are moderate in degree electrically.   ___________________________ Narda Amber, DO    Nerve Conduction Studies Anti Sensory Summary Table   Stim Site NR Peak (ms) Norm Peak (ms) P-T Amp (V) Norm P-T Amp  Left Median Anti Sensory (2nd Digit)  34.8C  Wrist    4.0 <3.4 24.4 >20  Right Median Anti Sensory (2nd Digit)  34.8C  Wrist    4.0 <3.4 26.9 >20  Left Ulnar Anti Sensory (5th Digit)  34.8C  Wrist    2.8 <3.1 23.5 >12  Right Ulnar Anti Sensory (5th Digit)  34.8C  Wrist    2.8 <3.1 23.0 >12   Motor Summary Table   Stim Site NR Onset (ms) Norm Onset (ms) O-P Amp (mV) Norm O-P Amp Site1 Site2 Delta-0 (ms) Dist (cm) Vel (m/s) Norm Vel  (m/s)  Left Median Motor (Abd Poll Brev)  34.8C  Wrist    4.4 <3.9 7.7 >6 Elbow Wrist 3.6 22.0 61 >50  Elbow    8.0  7.1         Right Median Motor (Abd Poll Brev)  34.8C  Wrist    4.5 <3.9 8.2 >6 Elbow Wrist 3.9 22.0 56 >50  Elbow    8.4  7.7         Left Ulnar Motor (Abd Dig Minimi)  34.8C  Wrist    2.3 <3.1 8.9 >7 B Elbow Wrist 2.9 18.0 62 >50  B Elbow    5.2  8.4  A Elbow B Elbow 1.4 10.0 71 >50  A Elbow    6.6  8.4         Right Ulnar Motor (Abd Dig Minimi)  34.8C  Wrist    2.3 <3.1 7.6 >7 B Elbow Wrist 3.2 19.0 59 >50  B Elbow    5.5  7.4  A Elbow B Elbow 1.4 10.0 71 >50  A Elbow    6.9  7.3          EMG   Side Muscle Ins Act Fibs Psw Fasc Number Recrt Dur Dur. Amp Amp. Poly Poly. Comment  Right 1stDorInt Nml Nml Nml Nml Nml Nml Nml Nml Nml Nml Nml Nml N/A  Right Abd Poll Brev Nml Nml Nml Nml Nml Nml Nml Nml Nml Nml Nml Nml N/A  Right Ext Indicis Nml Nml Nml Nml Nml Nml Nml Nml Nml Nml Nml  Nml N/A  Right PronatorTeres Nml Nml Nml Nml Nml Nml Nml Nml Nml Nml Nml Nml N/A  Right Biceps Nml Nml Nml Nml Nml Nml Nml Nml Nml Nml Nml Nml N/A  Right Triceps Nml Nml Nml Nml Nml Nml Nml Nml Nml Nml Nml Nml N/A  Right Deltoid Nml Nml Nml Nml Nml Nml Nml Nml Nml Nml Nml Nml N/A  Left 1stDorInt Nml Nml Nml Nml Nml Nml Nml Nml Nml Nml Nml Nml N/A  Left Abd Poll Brev Nml Nml Nml Nml Nml Nml Nml Nml Nml Nml Nml Nml N/A  Left Ext Indicis Nml Nml Nml Nml Nml Nml Nml Nml Nml Nml Nml Nml N/A  Left PronatorTeres Nml Nml Nml Nml Nml Nml Nml Nml Nml Nml Nml Nml N/A  Left Biceps Nml Nml Nml Nml Nml Nml Nml Nml Nml Nml Nml Nml N/A  Left Triceps Nml Nml Nml Nml Nml Nml Nml Nml Nml Nml Nml Nml N/A  Left Deltoid Nml Nml Nml Nml Nml Nml Nml Nml Nml Nml Nml Nml N/A      Waveforms:

## 2015-11-25 ENCOUNTER — Encounter: Payer: Self-pay | Admitting: Internal Medicine

## 2015-11-26 ENCOUNTER — Telehealth: Payer: Self-pay

## 2015-11-26 ENCOUNTER — Ambulatory Visit (INDEPENDENT_AMBULATORY_CARE_PROVIDER_SITE_OTHER): Payer: BLUE CROSS/BLUE SHIELD | Admitting: Internal Medicine

## 2015-11-26 ENCOUNTER — Encounter: Payer: Self-pay | Admitting: Internal Medicine

## 2015-11-26 ENCOUNTER — Ambulatory Visit (INDEPENDENT_AMBULATORY_CARE_PROVIDER_SITE_OTHER)
Admission: RE | Admit: 2015-11-26 | Discharge: 2015-11-26 | Disposition: A | Discharge status: Home / Self Care | Payer: BLUE CROSS/BLUE SHIELD | Source: Ambulatory Visit | Attending: Internal Medicine | Admitting: Internal Medicine

## 2015-11-26 VITALS — BP 130/68 | HR 85 | Temp 98.9°F | Resp 20 | Wt 300.0 lb

## 2015-11-26 DIAGNOSIS — R062 Wheezing: Secondary | ICD-10-CM | POA: Diagnosis not present

## 2015-11-26 DIAGNOSIS — R059 Cough, unspecified: Secondary | ICD-10-CM | POA: Insufficient documentation

## 2015-11-26 DIAGNOSIS — R05 Cough: Secondary | ICD-10-CM | POA: Diagnosis not present

## 2015-11-26 MED ORDER — AZITHROMYCIN 250 MG PO TABS: ORAL_TABLET | ORAL | Status: DC

## 2015-11-26 MED ORDER — HYDROCODONE-HOMATROPINE 5-1.5 MG/5ML PO SYRP: 5.0000 mL | ORAL_SOLUTION | Freq: Four times a day (QID) | ORAL | Status: DC | PRN

## 2015-11-26 MED ORDER — ALBUTEROL SULFATE HFA 108 (90 BASE) MCG/ACT IN AERS: 2.0000 | INHALATION_SPRAY | Freq: Four times a day (QID) | RESPIRATORY_TRACT | Status: DC | PRN

## 2015-11-26 NOTE — Telephone Encounter (Signed)
Mammogram results have been abstracted into the patients chart. However, there was not a modifier to indicate that she was candidate for annual mammograms.   I have added the modifier in Health Maintenance.

## 2015-11-26 NOTE — Progress Notes (Signed)
Pre visit review using our clinic review tool, if applicable. No additional management support is needed unless otherwise documented below in the visit note. 

## 2015-11-26 NOTE — Progress Notes (Signed)
Subjective:    Patient ID: [name], female    DOB: 05-20-1970, 45 y.o.   MRN: [id number]  HPI Here with acute onset mild to mod 3 days ST, HA, general weakness and malaise, with prod cough greenish sputum, but Pt denies chest pain, increased sob or doe, wheezing, orthopnea, PND, increased LE swelling, palpitations, dizziness or syncope.  Past Medical History:  Diagnosis Date  . Anxiety   . Endometriosis   . Exercise-induced asthma 09/08/2015  . Ovarian cancer, bilateral ([place]) 11/28/2014  . Thyroid disease    Past Surgical History:  Procedure Laterality Date  . ABDOMINAL HYSTERECTOMY  11/10/14   at [facility], Exp lap, supracervical hyst, BSO, infracolic omentectomy, lymphadenectomy, aortic lymph node sampling    reports that she has never smoked. She has never used smokeless tobacco. She reports that she drinks alcohol. She reports that she does not use drugs. family history includes Atrial fibrillation in her father; Dementia in her paternal grandfather and paternal grandmother; Diabetes in her maternal aunt and mother; Healthy in her brother; Heart attack in her maternal grandfather; Heart failure in her maternal grandmother; Hypertension in her mother; Stroke in her mother. Allergies  Allergen Reactions  . Moxifloxacin Anaphylaxis    needs epinephrine shot  . Quinolones Anaphylaxis, Shortness Of Breath and Swelling  . Doxycycline Nausea And Vomiting  . Escitalopram Oxalate Other (See Comments)     fatigue  . Sulfonamide Derivatives Swelling and Rash    needs epinephrine shot--rash and lip swelling   Current Outpatient Prescriptions on File Prior to Visit  Medication Sig Dispense Refill  . ALPRAZolam (XANAX) 1 MG tablet Take 1 tablet (1 mg total) by mouth 2 (two) times daily as needed for anxiety. 60 tablet 5  . AMBULATORY NON FORMULARY MEDICATION Medication Name: 1 manuel massage every 2 weeks 1 each 0  . aspirin-acetaminophen-caffeine (EXCEDRIN MIGRAINE) [id number] MG per  tablet Take 2 tablets by mouth 2 (two) times daily as needed for headache or migraine.    . Cyanocobalamin (VITAMIN B-12 PO) Take 2 tablets by mouth daily.    . cyclobenzaprine (FLEXERIL) 5 MG tablet Take 1 tablet (5 mg total) by mouth 2 (two) times daily. 60 tablet 5  . diclofenac (VOLTAREN) 75 MG EC tablet Take 1 tablet (75 mg total) by mouth 2 (two) times daily. 30 tablet 1  . fluocinonide cream (LIDEX) [id number] % Apply 1 application topically 2 (two) times daily. 60 g 0  . gabapentin (NEURONTIN) 600 MG tablet Take 1 tablet (600 mg total) by mouth 3 (three) times daily. 90 tablet 5  . hydrochlorothiazide (MICROZIDE) 12.5 MG capsule Take 1 capsule (12.5 mg total) by mouth daily. 90 capsule 3  . levothyroxine (SYNTHROID, LEVOTHROID) 50 MCG tablet Take 1 tablet (50 mcg total) by mouth daily before breakfast. 90 tablet 3  . lidocaine-prilocaine (EMLA) cream Apply to port 1-2 hours before access with needle. Cover with plastic wrap. 30 g 1  . pantoprazole (PROTONIX) 40 MG tablet Take 1 tablet twice a day for 2 weeks then 1 tablet daily. (Patient taking differently: Take 40 mg by mouth daily as needed (heartburn). ) 45 tablet 0  . sodium chloride (OCEAN) 0.65 % SOLN nasal spray Place 1 spray into both nostrils daily as needed for congestion.    . sucralfate (CARAFATE) 1 G tablet Take 1 tablet (1 g total) by mouth 4 (four) times daily -  before meals and at bedtime. (Patient taking differently: Take 1 g by mouth at bedtime. )  120 tablet 2  . SUMAtriptan (IMITREX) 100 MG tablet Take 1 tablet (100 mg total) by mouth every 2 (two) hours as needed for migraine. May repeat in 2 hours if headache persists or recurs. 10 tablet 11  . tetrahydrozoline-zinc (VISINE-AC) 0.05-0.25 % ophthalmic solution Place 2 drops into both eyes 3 (three) times daily as needed (dry eyes).    . traMADol (ULTRAM) 50 MG tablet Take 1 tablet (50 mg total) by mouth every 6 (six) hours as needed. 60 tablet 1  . venlafaxine XR (EFFEXOR XR)  150 MG 24 hr capsule Take 1 capsule (150 mg total) by mouth daily with breakfast. 90 capsule 3   No current facility-administered medications on file prior to visit.    Review of Systems     Objective:   Physical Exam        Assessment & Plan:

## 2015-11-26 NOTE — Telephone Encounter (Signed)
Order placed

## 2015-11-26 NOTE — Patient Instructions (Addendum)
.  Please take all new medication as prescribed - antibiotic, and cough medicine  You can also take Mucinex (or it's generic off brand) for congestion, and tylenol as needed for pain.  Please continue all other medications as before, including the inhaler  Please have the pharmacy call with any other refills you may need.  Please keep your appointments with your specialists as you may have planned  Your Chest xray was done today  You will be contacted by phone if any changes need to be made immediately.  Otherwise, you will receive a letter about your results with an explanation, but please check with MyChart first.  Please remember to sign up for MyChart if you have not done so, as this will be important to you in the future with finding out test results, communicating by private email, and scheduling acute appointments online when needed.

## 2015-11-27 ENCOUNTER — Encounter: Payer: Self-pay | Admitting: Internal Medicine

## 2015-11-27 MED ORDER — PREDNISONE 10 MG PO TABS: ORAL_TABLET | ORAL | Status: DC

## 2015-11-29 NOTE — Assessment & Plan Note (Signed)
Mild to mod, c/w bronchitis vs pna, declines cxr, for antibx course, cough med prn,  to f/u any worsening symptoms or concerns 

## 2015-11-30 ENCOUNTER — Encounter: Payer: Self-pay | Admitting: Internal Medicine

## 2015-12-01 ENCOUNTER — Telehealth: Payer: Self-pay | Admitting: Emergency Medicine

## 2015-12-01 MED ORDER — BENZONATATE 100 MG PO CAPS: 100.0000 mg | ORAL_CAPSULE | Freq: Three times a day (TID) | ORAL | 0 refills | Status: DC | PRN

## 2015-12-01 NOTE — Telephone Encounter (Signed)
You were not treated with a codeine related medication  You were tx with hydrocodone containing medication which is completely different and tolerated very well.   OK for work note - I will ask corrinne to help with this  OK for tessalon perle

## 2015-12-01 NOTE — Telephone Encounter (Signed)
Already addressed

## 2015-12-01 NOTE — Telephone Encounter (Signed)
Pt wants to know if you can prescribe something else for her cough so she can go into work. Please follow up thanks.

## 2015-12-14 ENCOUNTER — Other Ambulatory Visit: Payer: BLUE CROSS/BLUE SHIELD | Admitting: *Deleted

## 2015-12-14 DIAGNOSIS — M19072 Primary osteoarthritis, left ankle and foot: Secondary | ICD-10-CM

## 2015-12-14 DIAGNOSIS — G5752 Tarsal tunnel syndrome, left lower limb: Secondary | ICD-10-CM

## 2015-12-17 ENCOUNTER — Ambulatory Visit (HOSPITAL_BASED_OUTPATIENT_CLINIC_OR_DEPARTMENT_OTHER): Payer: BLUE CROSS/BLUE SHIELD

## 2015-12-17 DIAGNOSIS — Z452 Encounter for adjustment and management of vascular access device: Secondary | ICD-10-CM

## 2015-12-17 DIAGNOSIS — C562 Malignant neoplasm of left ovary: Secondary | ICD-10-CM

## 2015-12-17 DIAGNOSIS — C561 Malignant neoplasm of right ovary: Secondary | ICD-10-CM | POA: Diagnosis not present

## 2015-12-17 DIAGNOSIS — Z95828 Presence of other vascular implants and grafts: Secondary | ICD-10-CM

## 2015-12-17 DIAGNOSIS — C563 Malignant neoplasm of bilateral ovaries: Secondary | ICD-10-CM

## 2015-12-17 MED ORDER — SODIUM CHLORIDE 0.9 % IJ SOLN
10.0000 mL | INTRAMUSCULAR | Status: DC | PRN
  Administered 2015-12-17: 10 mL via INTRAVENOUS
  Filled 2015-12-17: qty 10

## 2015-12-17 MED ORDER — HEPARIN SOD (PORK) LOCK FLUSH 100 UNIT/ML IV SOLN
500.0000 [IU] | Freq: Once | INTRAVENOUS | Status: AC | PRN
  Administered 2015-12-17: 500 [IU] via INTRAVENOUS
  Filled 2015-12-17: qty 5

## 2015-12-17 NOTE — Patient Instructions (Signed)

## 2015-12-21 ENCOUNTER — Encounter: Payer: Self-pay | Admitting: Neurology

## 2015-12-22 ENCOUNTER — Encounter: Payer: Self-pay | Admitting: Pulmonary Disease

## 2015-12-22 ENCOUNTER — Ambulatory Visit (INDEPENDENT_AMBULATORY_CARE_PROVIDER_SITE_OTHER): Payer: BLUE CROSS/BLUE SHIELD | Admitting: Pulmonary Disease

## 2015-12-22 DIAGNOSIS — Z9989 Dependence on other enabling machines and devices: Principal | ICD-10-CM

## 2015-12-22 DIAGNOSIS — G4733 Obstructive sleep apnea (adult) (pediatric): Secondary | ICD-10-CM | POA: Diagnosis not present

## 2015-12-22 NOTE — Patient Instructions (Addendum)
It is nice to meet you today Continue on CPAP at bedtime. You appear to be benefiting from the treatment Goal is to wear for at least 4-6 hours each night for maximal clinical benefit. Continue to work on weight loss, as the link between excess weight  and sleep apnea is well established.  Do not drive if sleepy. Make sure you change your nasal pillows every month. Remember to Clean equipment once weekly. Follow up with Dr. With Dr. Corrie Dandy In 6  or before as needed.  Please contact office for sooner follow up if symptoms do not improve or worsen or seek emergency care

## 2015-12-22 NOTE — Assessment & Plan Note (Signed)
Moderate OSA on CPAP since 09/2015 Plan: Continue on CPAP at bedtime. You appear to be benefiting from the treatment Goal is to wear for at least 4-6 hours each night for maximal clinical benefit. Continue to work on weight loss, as the link between excess weight  and sleep apnea is well established.  Do not drive if sleepy. Make sure you change your nasal pillows every month. Remember to Clean equipment once weekly. Follow up with Dr. With Dr. Corrie Dandy In 6  or before as needed.  Please contact office for sooner follow up if symptoms do not improve or worsen or seek emergency care

## 2015-12-22 NOTE — Progress Notes (Signed)
History of Present Illness [name] is a 45 y.o. female with moderate  OSA per home sleep study ( AHI=21), on CPAP since 09/2015 followed by Dr. Corrie Dandy. 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   12/22/2015 Follow Up CPAP: Pt. Presents to the office today after having been started on CPAP in May by Dr. Corrie Dandy. She states she is doing well on her device. She is wearing nasal pillows with good results. She is waking more rested, and is able to get up more easily in the morning. She feels her nasal pillows fit well, and there is some leakage with turning in bed, but she adjusts the nasal pillows and it is corrected.  AHI is down to 0.7 . She has been managing with cleaning the mask, and is getting supplies from [facility]. She feels better rested daily. We did discuss weight loss and its role in improving OSA.  Tests Sleep Test; 09/21/2015 AHI= 21  Down Load: 11/21/15- 12/20/2015 Down Load Auto Set 5- 15 cm H2O Usage Days : 25/30 ( 83%) > 4 hours 21 days( 70 %) < 4 hours 4 days ( 13%) Average Usage 6 hours and 7 minutes. AHI= 0.7  Past medical hx Past Medical History:  Diagnosis Date  . Anxiety   . Endometriosis   . Exercise-induced asthma 09/08/2015  . Ovarian cancer, bilateral ([place]) 11/28/2014  . Thyroid disease      Past surgical hx, Family hx, Social hx all reviewed.  Current Outpatient Prescriptions on File Prior to Visit  Medication Sig  . albuterol (PROVENTIL HFA;VENTOLIN HFA) 108 (90 Base) MCG/ACT inhaler Inhale 2 puffs into the lungs every 6 (six) hours as needed for wheezing or shortness of breath.  . ALPRAZolam (XANAX) 1 MG tablet Take 1 tablet (1 mg total) by mouth 2 (two) times daily as needed for anxiety.  . AMBULATORY NON FORMULARY MEDICATION Medication Name: 1 manuel massage every 2 weeks  . aspirin-acetaminophen-caffeine (EXCEDRIN MIGRAINE) 250-250-65 MG per tablet Take 2 tablets by  mouth 2 (two) times daily as needed for headache or migraine.  . Cyanocobalamin (VITAMIN B-12 PO) Take 2 tablets by mouth daily.  . cyclobenzaprine (FLEXERIL) 5 MG tablet Take 1 tablet (5 mg total) by mouth 2 (two) times daily.  . diclofenac (VOLTAREN) 75 MG EC tablet Take 1 tablet (75 mg total) by mouth 2 (two) times daily.  . fluocinonide cream (LIDEX) [id number] % Apply 1 application topically 2 (two) times daily.  Marland Kitchen gabapentin (NEURONTIN) 600 MG tablet Take 1 tablet (600 mg total) by mouth 3 (three) times daily. (Patient taking differently: Take 1,200 mg by mouth 3 (three) times daily. )  . hydrochlorothiazide (MICROZIDE) 12.5 MG capsule Take 1 capsule (12.5 mg total) by mouth daily.  Marland Kitchen levothyroxine (SYNTHROID, LEVOTHROID) 50 MCG tablet Take 1 tablet (50 mcg total) by mouth daily before breakfast.  . lidocaine-prilocaine (EMLA) cream Apply to port 1-2 hours before access with needle. Cover with plastic wrap.  . pantoprazole (PROTONIX) 40 MG tablet Take 1 tablet twice a day for 2 weeks then 1 tablet daily. (Patient taking differently: Take 40 mg by mouth daily as needed (heartburn). )  . predniSONE (DELTASONE) 10 MG tablet 3 tabs by mouth per day for 3 days,2tabs per day for 3 days,1tab per day for 3 days  . sodium chloride (OCEAN) 0.65 % SOLN nasal spray Place 1 spray into both nostrils daily  as needed for congestion.  . sucralfate (CARAFATE) 1 G tablet Take 1 tablet (1 g total) by mouth 4 (four) times daily -  before meals and at bedtime. (Patient taking differently: Take 1 g by mouth at bedtime. )  . SUMAtriptan (IMITREX) 100 MG tablet Take 1 tablet (100 mg total) by mouth every 2 (two) hours as needed for migraine. May repeat in 2 hours if headache persists or recurs.  Marland Kitchen tetrahydrozoline-zinc (VISINE-AC) 0.05-0.25 % ophthalmic solution Place 2 drops into both eyes 3 (three) times daily as needed (dry eyes).  . traMADol (ULTRAM) 50 MG tablet Take 1 tablet (50 mg total) by mouth every 6 (six) hours as  needed.  . venlafaxine XR (EFFEXOR XR) 150 MG 24 hr capsule Take 1 capsule (150 mg total) by mouth daily with breakfast.   No current facility-administered medications on file prior to visit.      Allergies  Allergen Reactions  . Moxifloxacin Anaphylaxis    needs epinephrine shot  . Quinolones Anaphylaxis, Shortness Of Breath and Swelling  . Doxycycline Nausea And Vomiting  . Escitalopram Oxalate Other (See Comments)     fatigue  . Sulfonamide Derivatives Swelling and Rash    needs epinephrine shot--rash and lip swelling    Review Of Systems:  Constitutional:   No  weight loss, night sweats,  Fevers, chills, fatigue, or  lassitude.  HEENT:   No headaches,  Difficulty swallowing,  Tooth/dental problems, or  Sore throat,                No sneezing, itching, ear ache, nasal congestion, post nasal drip,   CV:  No chest pain,  Orthopnea, PND, swelling in lower extremities, anasarca, dizziness, palpitations, syncope.   GI  No heartburn, indigestion, abdominal pain, nausea, vomiting, diarrhea, change in bowel habits, loss of appetite, bloody stools.   Resp: No shortness of breath with exertion or at rest.  No excess mucus, no productive cough,  No non-productive cough,  No coughing up of blood.  No change in color of mucus.  No wheezing.  No chest wall deformity  Skin: no rash or lesions.  GU: no dysuria, change in color of urine, no urgency or frequency.  No flank pain, no hematuria   MS:  No joint pain or swelling.  No decreased range of motion.  No back pain.  Psych:  No change in mood or affect. No depression or anxiety.  No memory loss.   Vital Signs BP (!) 142/98 (BP Location: Left Arm, Cuff Size: Normal)   Pulse 93   Ht 5\' 8"  (1.727 m)   Wt 298 lb (135.2 kg)   LMP 11/02/2014   SpO2 98%   BMI 45.31 kg/m    Physical Exam:  General- No distress,  A&Ox3, pleasant ENT: No sinus tenderness, TM clear, pale nasal mucosa, no oral exudate,no post nasal drip, no  LAN Cardiac: S1, S2, regular rate and rhythm, no murmur Chest: No wheeze/ rales/ dullness; no accessory muscle use, no nasal flaring, no sternal retractions Abd.: Soft Non-tender Ext: No clubbing cyanosis, edema Neuro:  normal strength Skin: No rashes, warm and dry Psych: normal mood and behavior   Assessment/Plan  OSA on CPAP Moderate OSA on CPAP since 09/2015 Plan: Continue on CPAP at bedtime. You appear to be benefiting from the treatment Goal is to wear for at least 4-6 hours each night for maximal clinical benefit. Continue to work on weight loss, as the link between excess weight  and sleep  apnea is well established.  Do not drive if sleepy. Make sure you change your nasal pillows every month. Remember to Clean equipment once weekly. Follow up with Dr. With Dr. Corrie Dandy In 6  or before as needed.  Please contact office for sooner follow up if symptoms do not improve or worsen or seek emergency care    AHI down to 0.7 per hour with CPAP treatment.  Magdalen Spatz, NP 12/22/2015  4:47 PM   ATTENDING NOTE / ATTESTATION NOTE :   I have discussed the case with the resident/APP Eric Form.   I agree with the resident/APP's  history, physical examination, assessment, and plans.    I have edited the above note and modified it according to our agreed history, physical examination, assessment and plan.   Briefly, pt with moderate OSA with AHI 21. On autocpap 5-15 cm water and she is significantly improved and tolerating pressure. She is claustrophobic so nasal mask works best for her. Feels benefit of cpap. More energy. Less sleepiness. DL last month 70%, AHI 1.  She is in remission from Ovarian CA and is doing well.   Assessment and Plan : 1. Obstructive Sleep Apnea.  We extensively discussed the importance of treating OSA and the need to use PAP therapy.   Continue with autocpap 5-15 cm water. She is claustrophobic and is comfortable with nasal pillows. Mentioned about  Amara mask or For her mask or the Wisp as alternatives.    Patient was instructed to have mask, tubings, filter, reservoir cleaned at least once a week with soapy water.  Patient was instructed to call the office if he/she is having issues with the PAP device.    I advised patient to obtain sufficient amount of sleep --  7 to 8 hours at least in a 24 hr period.  Patient was advised to follow good sleep hygiene.  Patient was advised NOT to engage in activities requiring concentration and/or vigilance if he/she is and  sleepy.  Patient is NOT to drive if he/she is sleepy.   Return to clinic in 6 mos.      Monica Becton, MD 12/23/2015, 2:28 AM [facility] Pager [phone] After 3 pm or if no answer, call [phone]

## 2015-12-25 ENCOUNTER — Encounter: Payer: Self-pay | Admitting: Pulmonary Disease

## 2016-01-29 ENCOUNTER — Other Ambulatory Visit: Payer: Self-pay

## 2016-01-29 ENCOUNTER — Ambulatory Visit (HOSPITAL_BASED_OUTPATIENT_CLINIC_OR_DEPARTMENT_OTHER): Payer: BLUE CROSS/BLUE SHIELD

## 2016-01-29 ENCOUNTER — Ambulatory Visit (INDEPENDENT_AMBULATORY_CARE_PROVIDER_SITE_OTHER): Payer: BLUE CROSS/BLUE SHIELD | Admitting: Podiatry

## 2016-01-29 ENCOUNTER — Other Ambulatory Visit (HOSPITAL_BASED_OUTPATIENT_CLINIC_OR_DEPARTMENT_OTHER): Payer: BLUE CROSS/BLUE SHIELD

## 2016-01-29 ENCOUNTER — Ambulatory Visit: Payer: BLUE CROSS/BLUE SHIELD | Attending: Gynecologic Oncology | Admitting: Gynecologic Oncology

## 2016-01-29 ENCOUNTER — Encounter: Payer: Self-pay | Admitting: Gynecologic Oncology

## 2016-01-29 VITALS — BP 117/79 | HR 89 | Temp 98.9°F | Resp 18 | Wt 305.8 lb

## 2016-01-29 DIAGNOSIS — Z90710 Acquired absence of both cervix and uterus: Secondary | ICD-10-CM | POA: Diagnosis not present

## 2016-01-29 DIAGNOSIS — N809 Endometriosis, unspecified: Secondary | ICD-10-CM | POA: Diagnosis not present

## 2016-01-29 DIAGNOSIS — C562 Malignant neoplasm of left ovary: Secondary | ICD-10-CM

## 2016-01-29 DIAGNOSIS — Z9221 Personal history of antineoplastic chemotherapy: Secondary | ICD-10-CM | POA: Diagnosis not present

## 2016-01-29 DIAGNOSIS — M779 Enthesopathy, unspecified: Secondary | ICD-10-CM | POA: Diagnosis not present

## 2016-01-29 DIAGNOSIS — M19072 Primary osteoarthritis, left ankle and foot: Secondary | ICD-10-CM | POA: Diagnosis not present

## 2016-01-29 DIAGNOSIS — Z79899 Other long term (current) drug therapy: Secondary | ICD-10-CM | POA: Insufficient documentation

## 2016-01-29 DIAGNOSIS — Z823 Family history of stroke: Secondary | ICD-10-CM | POA: Diagnosis not present

## 2016-01-29 DIAGNOSIS — Z90722 Acquired absence of ovaries, bilateral: Secondary | ICD-10-CM

## 2016-01-29 DIAGNOSIS — Z888 Allergy status to other drugs, medicaments and biological substances status: Secondary | ICD-10-CM | POA: Diagnosis not present

## 2016-01-29 DIAGNOSIS — F419 Anxiety disorder, unspecified: Secondary | ICD-10-CM | POA: Insufficient documentation

## 2016-01-29 DIAGNOSIS — G5752 Tarsal tunnel syndrome, left lower limb: Secondary | ICD-10-CM | POA: Diagnosis not present

## 2016-01-29 DIAGNOSIS — E079 Disorder of thyroid, unspecified: Secondary | ICD-10-CM | POA: Diagnosis not present

## 2016-01-29 DIAGNOSIS — Z820 Family history of epilepsy and other diseases of the nervous system: Secondary | ICD-10-CM | POA: Diagnosis not present

## 2016-01-29 DIAGNOSIS — C563 Malignant neoplasm of bilateral ovaries: Secondary | ICD-10-CM

## 2016-01-29 DIAGNOSIS — Z833 Family history of diabetes mellitus: Secondary | ICD-10-CM | POA: Diagnosis not present

## 2016-01-29 DIAGNOSIS — J45990 Exercise induced bronchospasm: Secondary | ICD-10-CM | POA: Diagnosis not present

## 2016-01-29 DIAGNOSIS — Z95828 Presence of other vascular implants and grafts: Secondary | ICD-10-CM

## 2016-01-29 DIAGNOSIS — C569 Malignant neoplasm of unspecified ovary: Secondary | ICD-10-CM | POA: Diagnosis not present

## 2016-01-29 DIAGNOSIS — M25572 Pain in left ankle and joints of left foot: Secondary | ICD-10-CM

## 2016-01-29 DIAGNOSIS — E8940 Asymptomatic postprocedural ovarian failure: Secondary | ICD-10-CM | POA: Insufficient documentation

## 2016-01-29 DIAGNOSIS — M67472 Ganglion, left ankle and foot: Secondary | ICD-10-CM

## 2016-01-29 DIAGNOSIS — R0600 Dyspnea, unspecified: Secondary | ICD-10-CM | POA: Insufficient documentation

## 2016-01-29 DIAGNOSIS — C561 Malignant neoplasm of right ovary: Secondary | ICD-10-CM

## 2016-01-29 DIAGNOSIS — R109 Unspecified abdominal pain: Secondary | ICD-10-CM | POA: Diagnosis not present

## 2016-01-29 DIAGNOSIS — Z8249 Family history of ischemic heart disease and other diseases of the circulatory system: Secondary | ICD-10-CM | POA: Diagnosis not present

## 2016-01-29 LAB — CBC WITH DIFFERENTIAL/PLATELET
BASO%: 0.4 % (ref 0.0–2.0)
Basophils Absolute: 0 10*3/uL (ref 0.0–0.1)
EOS ABS: 0.3 10*3/uL (ref 0.0–0.5)
EOS%: 3 % (ref 0.0–7.0)
HCT: 39.8 % (ref 34.8–46.6)
HGB: 13.1 g/dL (ref 11.6–15.9)
LYMPH%: 22.3 % (ref 14.0–49.7)
MCH: 29.6 pg (ref 25.1–34.0)
MCHC: 33 g/dL (ref 31.5–36.0)
MCV: 89.8 fL (ref 79.5–101.0)
MONO#: 0.6 10*3/uL (ref 0.1–0.9)
MONO%: 5.9 % (ref 0.0–14.0)
NEUT%: 68.4 % (ref 38.4–76.8)
NEUTROS ABS: 6.5 10*3/uL (ref 1.5–6.5)
Platelets: 260 10*3/uL (ref 145–400)
RBC: 4.43 10*6/uL (ref 3.70–5.45)
RDW: 14.2 % (ref 11.2–14.5)
WBC: 9.5 10*3/uL (ref 3.9–10.3)
lymph#: 2.1 10*3/uL (ref 0.9–3.3)

## 2016-01-29 LAB — COMPREHENSIVE METABOLIC PANEL
ALT: 26 U/L (ref 0–55)
ANION GAP: 10 meq/L (ref 3–11)
AST: 20 U/L (ref 5–34)
Albumin: 3.6 g/dL (ref 3.5–5.0)
Alkaline Phosphatase: 87 U/L (ref 40–150)
BILIRUBIN TOTAL: 0.63 mg/dL (ref 0.20–1.20)
BUN: 11.1 mg/dL (ref 7.0–26.0)
CHLORIDE: 104 meq/L (ref 98–109)
CO2: 26 meq/L (ref 22–29)
Calcium: 9.2 mg/dL (ref 8.4–10.4)
Creatinine: 0.8 mg/dL (ref 0.6–1.1)
Glucose: 143 mg/dl — ABNORMAL HIGH (ref 70–140)
Potassium: 3.4 mEq/L — ABNORMAL LOW (ref 3.5–5.1)
Sodium: 140 mEq/L (ref 136–145)
TOTAL PROTEIN: 7.1 g/dL (ref 6.4–8.3)

## 2016-01-29 MED ORDER — HEPARIN SOD (PORK) LOCK FLUSH 100 UNIT/ML IV SOLN
500.0000 [IU] | Freq: Once | INTRAVENOUS | Status: AC | PRN
  Administered 2016-01-29: 500 [IU] via INTRAVENOUS
  Filled 2016-01-29: qty 5

## 2016-01-29 MED ORDER — ESTRADIOL 0.05 MG/24HR TD PTWK: 0.0500 mg | MEDICATED_PATCH | TRANSDERMAL | 12 refills | Status: DC

## 2016-01-29 MED ORDER — SODIUM CHLORIDE 0.9 % IJ SOLN
10.0000 mL | INTRAMUSCULAR | Status: DC | PRN
  Administered 2016-01-29: 10 mL via INTRAVENOUS
  Filled 2016-01-29: qty 10

## 2016-01-29 NOTE — Patient Instructions (Signed)
Plan to follow up with Dr. Marko Plume in December 2017 and Dr. Denman George in March 2018.  When you come to see Dr. Marko Plume, we will schedule your appointment with Dr. Denman George for March.  Begin using climara patch and wean yourself off of the effexor.  Please call for any questions or concerns.  Estradiol skin patches What is this medicine? ESTRADIOL (es tra DYE ole) skin patches contain an estrogen. It is mostly used as hormone replacement in menopausal women. It helps to treat hot flashes and prevent osteoporosis. It is also used to treat women with low estrogen levels or those who have had their ovaries removed. This medicine may be used for other purposes; ask your health care provider or pharmacist if you have questions. What should I tell my health care provider before I take this medicine? They need to know if you have any of these conditions: -abnormal vaginal bleeding -blood vessel disease or blood clots -breast, cervical, endometrial, ovarian, liver, or uterine cancer -dementia -diabetes -gallbladder disease -heart disease or recent heart attack -high blood pressure -high cholesterol -high level of calcium in the blood -hysterectomy -kidney disease -liver disease -migraine headaches -protein C deficiency -protein S deficiency -stroke -systemic lupus erythematosus (SLE) -tobacco smoker -an unusual or allergic reaction to estrogens, other hormones, medicines, foods, dyes, or preservatives -pregnant or trying to get pregnant -breast-feeding How should I use this medicine? This medicine is for external use only. Follow the directions on the prescription label. Tear open the pouch, do not use scissors. Remove the stiff protective liner covering the adhesive. Try not to touch the adhesive. Apply the patch, sticky side to the skin, to an area that is clean, dry and hairless. Avoid injured, irritated, calloused, or scarred areas. Do not apply the skin patches to your breasts or around the  waistline. Use a different site each time to prevent skin irritation. Do not cut or trim the patch. Do not stop using except on the advice of your doctor or health care professional. Do not wear more than one patch at a time unless you are told to do so by your doctor or health care professional. Contact your pediatrician regarding the use of this medicine in children. Special care may be needed. A patient package insert for the product will be given with each prescription and refill. Read this sheet carefully each time. The sheet may change frequently. Overdosage: If you think you have taken too much of this medicine contact a poison control center or emergency room at once. NOTE: This medicine is only for you. Do not share this medicine with others. What if I miss a dose? If you miss a dose, apply it as soon as you can. If it is almost time for your next dose, apply only that dose. Do not apply double or extra doses. What may interact with this medicine? Do not take this medicine with any of the following medications: -aromatase inhibitors like aminoglutethimide, anastrozole, exemestane, letrozole, testolactone This medicine may also interact with the following medications: -carbamazepine -certain antibiotics used to treat infections -certain barbiturates used for inducing sleep or treating seizures -grapefruit juice -medicines for fungus infections like itraconazole and ketoconazole -raloxifene or tamoxifen -rifabutin, rifampin, or rifapentine -ritonavir -St. John's Wort This list may not describe all possible interactions. Give your health care provider a list of all the medicines, herbs, non-prescription drugs, or dietary supplements you use. Also tell them if you smoke, drink alcohol, or use illegal drugs. Some items may interact with  your medicine. What should I watch for while using this medicine? Visit your doctor or health care professional for regular checks on your progress. You  will need a regular breast and pelvic exam and Pap smear while on this medicine. You should also discuss the need for regular mammograms with your health care professional, and follow his or her guidelines for these tests. This medicine can make your body retain fluid, making your fingers, hands, or ankles swell. Your blood pressure can go up. Contact your doctor or health care professional if you feel you are retaining fluid. If you have any reason to think you are pregnant, stop taking this medicine right away and contact your doctor or health care professional. Smoking increases the risk of getting a blood clot or having a stroke while you are taking this medicine, especially if you are more than 45 years old. You are strongly advised not to smoke. If you wear contact lenses and notice visual changes, or if the lenses begin to feel uncomfortable, consult your eye doctor or health care professional. This medicine can increase the risk of developing a condition (endometrial hyperplasia) that may lead to cancer of the lining of the uterus. Taking progestins, another hormone drug, with this medicine lowers the risk of developing this condition. Therefore, if your uterus has not been removed (by a hysterectomy), your doctor may prescribe a progestin for you to take together with your estrogen. You should know, however, that taking estrogens with progestins may have additional health risks. You should discuss the use of estrogens and progestins with your health care professional to determine the benefits and risks for you. If you are going to have surgery or an MRI, you may need to stop taking this medicine. Consult your health care professional for advice before you schedule the surgery. You may bathe or participate in other activities while wearing your patch. If the patch pulls loose or falls off, you may reapply it if the patch is sticky enough to stay on the skin. You should reapply the patch in a different  area. Use a fresh patch if it will no longer stick. What side effects may I notice from receiving this medicine? Side effects that you should report to your doctor or health care professional as soon as possible: -allergic reactions like skin rash, itching or hives, swelling of the face, lips, or tongue -breast tissue changes or discharge -changes in vision -chest pain -confusion, trouble speaking or understanding -dark urine -general ill feeling or flu-like symptoms -light-colored stools -nausea, vomiting -pain, swelling, warmth in the leg -right upper belly pain -severe headaches -shortness of breath -sudden numbness or weakness of the face, arm or leg -trouble walking, dizziness, loss of balance or coordination -unusual vaginal bleeding -yellowing of the eyes or skin Side effects that usually do not require medical attention (report to your doctor or health care professional if they continue or are bothersome): -hair loss -increased hunger or thirst -increased urination -symptoms of vaginal infection like itching, irritation or unusual discharge -unusually weak or tired This list may not describe all possible side effects. Call your doctor for medical advice about side effects. You may report side effects to FDA at 1-800-FDA-1088. Where should I keep my medicine? Keep out of the reach of children. Store at room temperature below 30 degrees C (86 degrees F). Do not store any patches that have been removed from their protective pouch. Throw away any unused medicine after the expiration date. Dispose of used patches  properly. Since used patches may still contain active hormones, fold the patch in half so that it sticks to itself prior to disposal. NOTE: This sheet is a summary. It may not cover all possible information. If you have questions about this medicine, talk to your doctor, pharmacist, or health care provider.    2016, [employer]. (2010-07-28 09:19:41)

## 2016-01-29 NOTE — Progress Notes (Signed)
Follow Up Note: Gyn-Onc  [name] 45 y.o. female  CC:  Chief Complaint  Patient presents with  . Ovarian Cancer    follow up visit   Assessment/Plan:   Serita Silver is a 45 y.o. 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 Disposition is to six cycles of IV carboplatin/paclitaxel chemotherapy per [facility] along with a referral to genetics.  Plan is to follow-up with Dr Marko Plume for chemotherapy.  HPI:  [name] is a 45 year old female woman who presented to the [facility] Emergency department with acute onset abdominal pain, dyspnea, and bilateral ovarian masses seen on CT. She was transferred to [facility] from  [facility] for further evaluation and treatment. She reported a history of endometriosis and infertility but no other gynecologic history. Her last pap smear was multiple years prior to presentation and she denied a history of dysplasia along with abnormal uterine bleeding.  Preop Labs and Imaging:  CA 125: 554  CEA: 0.9  CA 19-9: 587  CT 11/09/14: IMPRESSION: 1. Large multiloculated partially cystic partially solid mass replacing the left ovary. The right ovary is also enlarged, heterogeneous, and abnormal in appearance with the inferior margin immediately abutting this mass lesion. Despite trace fat plane along the superior aspect of the right ovary and cystic/solid mass, the right ovary is markedly abnormal in appearance and certainly involved. These findings are concerning for a primary ovarian malignancy involving both ovaries. A pelvic ultrasound has been ordered the time of examination and may be useful in further evaluation of these pelvic masses. 2. The base of the appendix is slightly dilated and demonstrates hyperenhancement of the wall. Additionally, the lateral aspect of the base the appendix immediately abuts the right side of the pelvic mass. These findings may reflect appendiceal involvement. 3. Minimal abutment of the sigmoid colon  with relatively preserved fat plane throughout. No differential dilatation of the sigmoid colon or obvious tethering to suggest sigmoid involvement. However, no discrete fat plane is seen within a short segment as described above. 4. Trace ascites. No hydronephrosis or omental caking. 5. Minimal perihepatic free fluid as well less enlarged portacaval lymph node node. 6. Subtle posterior right hepatic dome hypodensity is too small to characterize. While this lesion does not have the typical appearance of a metastasis, this finding remains indeterminate on this suboptimally timed study. If clinically indicated, a MRI of the abdomen without and with contrast may be obtained for further evaluation.  On 11/10/14, she underwent an exploratory laparotomy and radical tumor debulking with lysis of adhesions for 90 minutes and bilateral ureterolysis for retroperitoneal fibrosis by Dr. Thurston Pounds at [facility].  Operative findings included:  1. Chocolate colored ascites immediately noted upon abdominal entry consistent with ruptured endometrioma  2. Bilateral ovarian masses with chocolate fluid, left side measuring ~12cm with ~8cm solid component, right side measuring ~6cm (frozen section of the left ovary showing invasive carcinoma concerning for clear cell)  3. 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 10 cm fibroid uterus with normal cervix to palpation  5. Normal abdominal survey with normal appearing omentum, normal appearing appendix retroperitoneal and running cephalad along the cecum, normal small and large bowel without evidence of cancer spread, smooth diaphragms bilaterally, smooth liver edge with hepatomegaly, normal stomach and normal kidneys on palpation  6. Sigmoid colon densely adherent to the uterus and posterior cervix precluding cervical excision  7. No pathologically enlarged lymph nodes but  discolored shotty left  obturator and aortic lymph nodes excised  8. R0 resection with no gross residual cancer at surgery close.  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 (please see synoptic report)  - Endometriosis  Left fallopian tube:  - Negative for involvement by clear cell carcinoma  B: Ovary and tube, right salpingo-oophorectomy  Right ovary:  - Ovarian clear cell carcinoma (please see synoptic report)  - Endometriosis  Right fallopian tube:  - Negative for involvement by clear cell carcinoma  - Endometriosis  C: Uterus, supracervical hysterectomy  - Negative for involvement by clear cell carcinoma  Additional findings:  Endometrium: - Weakly proliferative endometrium  - No hyperplasia or carcinoma identified  Myometrium:  - Extensive adenomyosis  D: Lymph node, aortic, regional resection  - One lymph node negative for metastatic carcinoma (0/1)  E: Lymph node, right pelvic, regional resection  - Four lymph nodes negative for metastatic carcinoma (0/4)  F: Lymph node, left pelvic, regional resection  - Two lymph nodes negative for metastatic carcinoma (0/2)  G: Omentum, omentectomy  - Negative for involvement by clear cell carcinoma  - Mature adipose tissue  H: Peritoneum, biopsy  - Negative for involvement by clear cell carcinoma  - Fibroadipose tissue  A: Pelvic washing  - No malignant cells identified (see comment)  B: Diaphragm, brushing/scraping  - No malignant cells identified  - Reactive mesothelial cells  Interval History:  The patient presents today with her significant other for post-operative follow up.  She reports doing well since surgery.  Appetite improving with no nausea or emesis reported.  Minimal pain reported.   No other concerns voiced.    Review of Systems  Constitutional: Feels well.  No fever, chills, early satiety, unintentional weight loss or gain.  Appetite  improving.  Cardiovascular: No chest pain, shortness of breath, or edema.  Pulmonary: No cough or wheeze.  Gastrointestinal: No nausea, vomiting, or diarrhea. No bright red blood per rectum or change in bowel movement.  Genitourinary: No frequency, urgency. No vaginal bleeding or discharge.  Musculoskeletal: No myalgia or joint pain. Neurologic: No weakness, numbness, or change in gait.  Psychology: No depression, anxiety, or insomnia.  Current Meds:  Outpatient Encounter Prescriptions as of 01/29/2016  Medication Sig  . ALPRAZolam (XANAX) 1 MG tablet Take 1 tablet (1 mg total) by mouth 2 (two) times daily as needed for anxiety.  . AMBULATORY NON FORMULARY MEDICATION Medication Name: 1 manuel massage every 2 weeks  . aspirin-acetaminophen-caffeine (EXCEDRIN MIGRAINE) 250-250-65 MG per tablet Take 2 tablets by mouth 2 (two) times daily as needed for headache or migraine.  . Cyanocobalamin (VITAMIN B-12 PO) Take 2 tablets by mouth daily.  . fluocinonide cream (LIDEX) AB-123456789 % Apply 1 application topically 2 (two) times daily.  . hydrochlorothiazide (MICROZIDE) 12.5 MG capsule Take 1 capsule (12.5 mg total) by mouth daily.  Marland Kitchen levothyroxine (SYNTHROID, LEVOTHROID) 50 MCG tablet Take 1 tablet (50 mcg total) by mouth daily before breakfast.  . lidocaine-prilocaine (EMLA) cream Apply to port 1-2 hours before access with needle. Cover with plastic wrap.  . traMADol (ULTRAM) 50 MG tablet Take 1 tablet (50 mg total) by mouth every 6 (six) hours as needed.  . venlafaxine XR (EFFEXOR XR) 150 MG 24 hr capsule Take 1 capsule (150 mg total) by mouth daily with breakfast.  . [DISCONTINUED] gabapentin (NEURONTIN) 600 MG tablet Take 1 tablet (600 mg total) by  mouth 3 (three) times daily. (Patient taking differently: Take 1,200 mg by mouth 3 (three) times daily. )  . albuterol (PROVENTIL HFA;VENTOLIN HFA) 108 (90 Base) MCG/ACT inhaler Inhale 2 puffs into the lungs every 6 (six) hours as needed for wheezing or  shortness of breath.  . cyclobenzaprine (FLEXERIL) 5 MG tablet Take 1 tablet (5 mg total) by mouth 2 (two) times daily.  . diclofenac (VOLTAREN) 75 MG EC tablet Take 1 tablet (75 mg total) by mouth 2 (two) times daily.  Marland Kitchen estradiol (CLIMARA) 0.05 mg/24hr patch Place 1 patch (0.05 mg total) onto the skin once a week.  . pantoprazole (PROTONIX) 40 MG tablet Take 1 tablet twice a day for 2 weeks then 1 tablet daily.  . sodium chloride (OCEAN) 0.65 % SOLN nasal spray Place 1 spray into both nostrils daily as needed for congestion.  . sucralfate (CARAFATE) 1 G tablet Take 1 tablet (1 g total) by mouth 4 (four) times daily -  before meals and at bedtime. (Patient taking differently: Take 1 g by mouth at bedtime. )  . SUMAtriptan (IMITREX) 100 MG tablet Take 1 tablet (100 mg total) by mouth every 2 (two) hours as needed for migraine. May repeat in 2 hours if headache persists or recurs.  Marland Kitchen tetrahydrozoline-zinc (VISINE-AC) 0.05-0.25 % ophthalmic solution Place 2 drops into both eyes 3 (three) times daily as needed (dry eyes).  . [DISCONTINUED] predniSONE (DELTASONE) 10 MG tablet 3 tabs by mouth per day for 3 days,2tabs per day for 3 days,1tab per day for 3 days   No facility-administered encounter medications on file as of 01/29/2016.     Allergy:  Allergies  Allergen Reactions  . Moxifloxacin Anaphylaxis    needs epinephrine shot  . Quinolones Anaphylaxis, Shortness Of Breath and Swelling  . Doxycycline Nausea And Vomiting  . Escitalopram Oxalate Other (See Comments)     fatigue  . Sulfonamide Derivatives Swelling and Rash    needs epinephrine shot--rash and lip swelling    Social Hx:   Social History   Social History  . Marital status: Divorced    Spouse name: N/A  . Number of children: 0  . Years of education: N/A   Occupational History  . Not on file.   Social History Main Topics  . Smoking status: Never Smoker  . Smokeless tobacco: Never Used  . Alcohol use 0.0 oz/week      Comment: Rarely  . Drug use: No  . Sexual activity: Not on file   Other Topics Concern  . Not on file   Social History Narrative   Lives with boyfriend in a one story home.  Has no children.     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     Education: college.    Past Surgical Hx:  Past Surgical History:  Procedure Laterality Date  . ABDOMINAL HYSTERECTOMY  11/10/14   at [facility], Exp lap, supracervical hyst, BSO, infracolic omentectomy, lymphadenectomy, aortic lymph node sampling    Past Medical Hx:  Past Medical History:  Diagnosis Date  . Anxiety   . Endometriosis   . Exercise-induced asthma 09/08/2015  . Ovarian cancer, bilateral ([place]) 11/28/2014  . Thyroid disease     Family Hx:  Family History  Problem Relation Age of Onset  . Hypertension Mother   . Diabetes Mother   . Stroke Mother     Deceased  . Atrial fibrillation Father     Living  . Diabetes Maternal Aunt   . Heart failure Maternal  Grandmother   . Heart attack Maternal Grandfather   . Dementia Paternal Grandmother   . Dementia Paternal Grandfather   . Healthy Brother     Vitals:  Blood pressure 117/79, pulse 89, temperature 98.9 F (37.2 C), temperature source Oral, resp. rate 18, weight (!) 305 lb 12.8 oz (138.7 kg), last menstrual period 11/02/2014, SpO2 100 %.  Physical Exam:  General: Well developed, well nourished female in no acute distress. Alert and oriented x 3.  Cardiovascular: Regular rate and rhythm. S1 and S2 normal.  Lungs: Clear to auscultation bilaterally. No wheezes/crackles/rhonchi noted.  Skin: No rashes or lesions present. Back: No CVA tenderness.  Abdomen: Abdomen soft, non-tender and morbidly obese. Active bowel sounds in all quadrants. 41 staples removed without difficulty.  Erythema noted around the staple insertion sites on the upper abdomen with moderate erythema present surrounding the lower aspect of the incision.  2 cm of induration noted on either side of the lower incision as well.  Increased  warmth to palpation.  No drainage noted or expressed.  Tender with light palpation.  1/2 inch steri strips applied.      Extremities: Edema noted bilaterally, slightly more in the left lower extremity but non-pitting.  No erythema or tenderness noted with the LLE.  Negative Homans sign with the LLE.  No bilateral cyanosis or clubbing.    Donaciano Eva, MD 03/08/2016, 3:53 PM

## 2016-01-30 LAB — CA 125: Cancer Antigen (CA) 125: 25.2 U/mL (ref 0.0–38.1)

## 2016-02-01 NOTE — Progress Notes (Signed)
Subjective: 45 year old female presents the office today 6 weeks after taken out of [place] brace. She states the brace does not fit her well and she still getting pain is she points the office acid and ankle motions majority of pain. She states that she was doing a walk and she states that she is having difficulty exercising or doing a lot of walking due to the pain to her left foot. She still gets intermittent swelling on the outside aspect of ankle as well. Denies any systemic complaints such as fevers, chills, nausea, vomiting. No acute changes since last appointment, and no other complaints at this time.   Objective: AAO x3, NAD DP/PT pulses palpable bilaterally, CRT less than 3 seconds There is continued tenderness in the lateral aspect of the left foot on the sinus tarsi. There is mild discomfort with subtalar joint range of motion. There is a palpable cyst present just inferior to lateral malleolus which may be a ganglion cyst. There is localized edema to the lateral aspect of the foot overlying erythema or increase in warmth. Ankle joint range of motion intact. There is no area pinpoint bony tenderness or pain the vibratory sensation. Mild discomfort on the course of peroneal tendon just inferior to lateral malleolus. Tendons appear intact.  Decrease in medial arch height. No open lesions or pre-ulcerative lesions.  No pain with calf compression, swelling, warmth, erythema  Assessment: Continuation left sinus tarsi pain; cyst   Plan: -All treatment options discussed with the patient including all alternatives, risks, complications.  -Her brace is not fitting well but she did not bring it with her today. I'll get her into see Betha for possible modification. Discussed with her wearing a thicker sock as well as shoe changes. -Given that she still continued to have pain and recommended MRI to evaluate for the cyst as well as arthritis of the subtalar joint as well as peroneal tendon tear. -I  gave her a pair of powersteps to try. Break-in instructions discussed.  -Follow-up after MRI sooner if needed. -Patient encouraged to call the office with any questions, concerns, change in symptoms.   Celesta Gentile, DPM

## 2016-02-02 ENCOUNTER — Telehealth: Payer: Self-pay | Admitting: *Deleted

## 2016-02-02 DIAGNOSIS — M069 Rheumatoid arthritis, unspecified: Secondary | ICD-10-CM

## 2016-02-02 DIAGNOSIS — M7989 Other specified soft tissue disorders: Secondary | ICD-10-CM

## 2016-02-02 NOTE — Telephone Encounter (Addendum)
-----   Message from Trula Slade, DPM sent at 01/29/2016  3:53 PM EDT ----- Can you order an MRI of the left ankle to look for soft tissue mass lateral ankle and to evaluate for STJ arthritis? Thanks. 02/02/2016-Orders faxed to [facility]. 02/26/2016-[facility] faxed notice they were unable to contact pt to schedule MRI. Left message with [facility] appt line.

## 2016-02-08 ENCOUNTER — Other Ambulatory Visit: Payer: BLUE CROSS/BLUE SHIELD

## 2016-02-18 ENCOUNTER — Other Ambulatory Visit: Payer: BLUE CROSS/BLUE SHIELD

## 2016-02-18 ENCOUNTER — Ambulatory Visit: Payer: BLUE CROSS/BLUE SHIELD | Admitting: Oncology

## 2016-02-24 ENCOUNTER — Ambulatory Visit (INDEPENDENT_AMBULATORY_CARE_PROVIDER_SITE_OTHER): Payer: BLUE CROSS/BLUE SHIELD | Admitting: Neurology

## 2016-02-24 ENCOUNTER — Encounter: Payer: Self-pay | Admitting: Neurology

## 2016-02-24 VITALS — BP 100/68 | HR 99 | Ht 68.0 in | Wt 306.1 lb

## 2016-02-24 DIAGNOSIS — M79601 Pain in right arm: Secondary | ICD-10-CM

## 2016-02-24 DIAGNOSIS — G620 Drug-induced polyneuropathy: Secondary | ICD-10-CM | POA: Diagnosis not present

## 2016-02-24 DIAGNOSIS — M79604 Pain in right leg: Secondary | ICD-10-CM | POA: Diagnosis not present

## 2016-02-24 DIAGNOSIS — T451X5A Adverse effect of antineoplastic and immunosuppressive drugs, initial encounter: Secondary | ICD-10-CM

## 2016-02-24 DIAGNOSIS — G5603 Carpal tunnel syndrome, bilateral upper limbs: Secondary | ICD-10-CM | POA: Diagnosis not present

## 2016-02-24 MED ORDER — GABAPENTIN 600 MG PO TABS: 1200.0000 mg | ORAL_TABLET | Freq: Three times a day (TID) | ORAL | 11 refills | Status: DC

## 2016-02-24 MED ORDER — NORTRIPTYLINE HCL 10 MG PO CAPS: ORAL_CAPSULE | ORAL | 11 refills | Status: DC

## 2016-02-24 NOTE — Patient Instructions (Addendum)
1.  Continue gabapentin 1200mg  three times daily 2.  Start nortriptyline 10mg  at bedtime for 2 week, then increase to 2 tablet at bedtime 3.  You can try over-the-counter AsperCream to your feet at bedtime 4.  Continue to use wrist splints 5.  Follow-up with your primary care doctor regarding your musculoskeletal pain  Return to clinic in 6 months

## 2016-02-24 NOTE — Progress Notes (Signed)
Follow-up Visit   Date: 02/24/16    [name] MRN: [id number] DOB: [birth year]   Interim History: [name] is a 45 y.o. right-handed Caucasian female with ovarian cancer s/p bilateral SPO and hysterectomy and chemotherapy (11/2014), hypothyroidism, endometriosis, migraines, and anxiety returning to the clinic for follow-up of chemotherapy-induced neuropathy.  The patient was accompanied to the clinic by self.  History of present illness: She was diagnosed with bilateral ovarian cancer in July 2016 and underwent BSO with hysterectomy and 6-cycles of adjuvant chemotherapy with carboplatin and taxol, completed in November 2016. Around September 2016, she began noticing numbness and tingling of the hands a feet which would initially last the week of chemo and usually resolve by the 3rd week, but this gradually progressed where symptoms became constant and did not improve. Standing for > 20 minutes causes worsening of her pins and needles. She has noticed toe weakness and gait instability, but denies any falls. She was taking gabapentin 300mg  TID, but did not have any benefit so stopped it. She did not have any side effects to the medication and it was not tried at a higher dose.   She also complains of right sided low back pain which is dull and achy, worse with palpation. She also has posterior leg pain which radiates up her leg and thigh. She denies left sided back pain or leg pain, but does endorse paresthesias. She had not done physical therapy.   She reports having history of arthritis of the right knee and had a meniscal tear.   UPDATE 11/06/2015:  At her last visit, I recommended starting physical therapy and flexeril for her low back pain.  She did not go for physical therapy because of time constraints.  She feel that the flexeril helps a little, but nothing has helped her low back pain. She does not have pain that radiates into her legs.  Her gabapentin was slowly  titrated to 600mg  TID which has helped the burning pain in her feet.  She occasionally has sharp shooting intermittent pain of the feet.  She complains of bilateral thumb pain and soreness in addition to hand paresthesias.   She also has pulsating pain over her arms.     UPDATE 02/24/2016:  She had worsening pain of her legs, which is described as "radiating pulsating pain" of her legs that starts in her feet and moves up her legs. Her gabapentin was increased to 1200mg  TID by her PCP, which has not made a huge difference. She has severe burning of the feet, as if her feet are on fire.  Additionally, she complains of soreness and weakness of the right arm, upper arm, and shoulder which is worse with pronation.  At her last visit, she had EDX of the arms which showed bilateral CTS, no evidence of radiculopathy.  She has been wearing a splint at night for bilateral CTS which helps.  She is not interested in PT because of her busy schedule, but has been trying to stay more active herself.  Medications:  Current Outpatient Prescriptions on File Prior to Visit  Medication Sig Dispense Refill  . albuterol (PROVENTIL HFA;VENTOLIN HFA) 108 (90 Base) MCG/ACT inhaler Inhale 2 puffs into the lungs every 6 (six) hours as needed for wheezing or shortness of breath. 1 Inhaler 2  . ALPRAZolam (XANAX) 1 MG tablet Take 1 tablet (1 mg total) by mouth 2 (two) times daily as needed for anxiety. 60 tablet 5  . AMBULATORY NON FORMULARY MEDICATION Medication  Name: 1 manuel massage every 2 weeks 1 each 0  . aspirin-acetaminophen-caffeine (EXCEDRIN MIGRAINE) [id number] MG per tablet Take 2 tablets by mouth 2 (two) times daily as needed for headache or migraine.    . Cyanocobalamin (VITAMIN B-12 PO) Take 2 tablets by mouth daily.    . cyclobenzaprine (FLEXERIL) 5 MG tablet Take 1 tablet (5 mg total) by mouth 2 (two) times daily. 60 tablet 5  . diclofenac (VOLTAREN) 75 MG EC tablet Take 1 tablet (75 mg total) by mouth 2 (two)  times daily. 30 tablet 1  . estradiol (CLIMARA) 0.05 mg/24hr patch Place 1 patch (0.05 mg total) onto the skin once a week. 4 patch 12  . fluocinonide cream (LIDEX) [id number] % Apply 1 application topically 2 (two) times daily. 60 g 0  . hydrochlorothiazide (MICROZIDE) 12.5 MG capsule Take 1 capsule (12.5 mg total) by mouth daily. 90 capsule 3  . levothyroxine (SYNTHROID, LEVOTHROID) 50 MCG tablet Take 1 tablet (50 mcg total) by mouth daily before breakfast. 90 tablet 3  . lidocaine-prilocaine (EMLA) cream Apply to port 1-2 hours before access with needle. Cover with plastic wrap. 30 g 1  . pantoprazole (PROTONIX) 40 MG tablet Take 1 tablet twice a day for 2 weeks then 1 tablet daily. 45 tablet 0  . sodium chloride (OCEAN) 0.65 % SOLN nasal spray Place 1 spray into both nostrils daily as needed for congestion.    . sucralfate (CARAFATE) 1 G tablet Take 1 tablet (1 g total) by mouth 4 (four) times daily -  before meals and at bedtime. (Patient taking differently: Take 1 g by mouth at bedtime. ) 120 tablet 2  . SUMAtriptan (IMITREX) 100 MG tablet Take 1 tablet (100 mg total) by mouth every 2 (two) hours as needed for migraine. May repeat in 2 hours if headache persists or recurs. 10 tablet 11  . tetrahydrozoline-zinc (VISINE-AC) 0.05-0.25 % ophthalmic solution Place 2 drops into both eyes 3 (three) times daily as needed (dry eyes).    . traMADol (ULTRAM) 50 MG tablet Take 1 tablet (50 mg total) by mouth every 6 (six) hours as needed. 60 tablet 1  . venlafaxine XR (EFFEXOR XR) 150 MG 24 hr capsule Take 1 capsule (150 mg total) by mouth daily with breakfast. 90 capsule 3   No current facility-administered medications on file prior to visit.     Allergies:  Allergies  Allergen Reactions  . Moxifloxacin Anaphylaxis    needs epinephrine shot  . Quinolones Anaphylaxis, Shortness Of Breath and Swelling  . Doxycycline Nausea And Vomiting  . Escitalopram Oxalate Other (See Comments)     fatigue  .  Sulfonamide Derivatives Swelling and Rash    needs epinephrine shot--rash and lip swelling    Review of Systems:  CONSTITUTIONAL: No fevers, chills, night sweats, or weight loss.  EYES: No visual changes or eye pain ENT: No hearing changes.  No history of nose bleeds.   RESPIRATORY: No cough, wheezing and shortness of breath.   CARDIOVASCULAR: Negative for chest pain, and palpitations.   GI: Negative for abdominal discomfort, blood in stools or black stools.  No recent change in bowel habits.   GU:  No history of incontinence.   MUSCLOSKELETAL: +history of joint pain or swelling.  +myalgias.   SKIN: Negative for lesions, rash, and itching.   ENDOCRINE: Negative for cold or heat intolerance, polydipsia or goiter.   PSYCH:  No depression or anxiety symptoms.   NEURO: As Above.   Vital Signs:  BP 100/68   Pulse 99   Ht 5\' 8"  (1.727 m)   Wt (!) 306 lb 1 oz (138.8 kg)   LMP 11/02/2014   SpO2 98%   BMI 46.54 kg/m   Neurological Exam: MENTAL STATUS including orientation to time, place, person, recent and remote memory, attention span and concentration, language, and fund of knowledge is normal.  Speech is not dysarthric.  CRANIAL NERVES:   Face is symmetric.   MOTOR:  There is no weakness with pronation or supination. There is mild give-way weakness of right hip flexion, otherwise motor strength is 5/5 throughout.    MSRs:  Right                                                                 Left brachioradialis 2+  brachioradialis 2+  biceps 2+  biceps 2+  triceps 1+  triceps 1+  Patellar 0  Patellar 0  ankle jerk 0  ankle jerk 0  Hoffman no  Hoffman no  plantar response down  plantar response down   SENSORY:  Sensory loss involving glove-stocking distribution bilaterally.  COORDINATION/GAIT:   Gait wide-based and antalgic, stable.   Data: Lab Results  Component Value Date   TSH 1.877 09/04/2015   NCS/EMG of the upper extremities 11/24/2015:  Bilateral median  neuropathy at or distal to the wrist, consistent with clinical diagnosis of carpal tunnel syndrome. Overall, these findings are moderate in degree electrically   IMPRESSION/PLAN: 1. 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 - Continue gabapentin 1200mg  TID - Start nortriptyline 10mg  at bedtime for 2 week, then increase to 2 tablet at bedtime  - NCS/EMG of the legs would help determine the severity of her neuropathy, keeping in mind her body habitus may cause technical limitation, she does not wish to proceed with this at this time  - She had many questions about the prognosis of her neuropathy, which was addressed  2.  Bilateral carpal tunnel syndrome, moderate in degree electrically  - Continue wrist splint  - Orthopeadic referred declined  3. Right low back pain, leg and arm most likely musculoskeletal.  Her exam does not suggest cervical or lumbosacral impingement, but imaging can be done going forward, if she continues to have chronic pain - Patient declined physical therapy   - Follow-up with PCP  4. Bilateral ovarian cancer s/p SPO and hysterectomy (11/2014) and chemotherapy  Return to clinic in 6 months   The duration of this appointment visit was 35 minutes of face-to-face time with the patient.  Greater than 50% of this time was spent in counseling, explanation of diagnosis, planning of further management, and coordination of care.   Thank you for allowing me to participate in patient's care.  If I can answer any additional questions, I would be pleased to do so.    Sincerely,    Jocelynne Duquette K. Posey Pronto, DO

## 2016-03-14 ENCOUNTER — Telehealth: Payer: Self-pay | Admitting: *Deleted

## 2016-03-14 ENCOUNTER — Ambulatory Visit (INDEPENDENT_AMBULATORY_CARE_PROVIDER_SITE_OTHER): Payer: BLUE CROSS/BLUE SHIELD

## 2016-03-14 ENCOUNTER — Other Ambulatory Visit: Payer: Self-pay | Admitting: Podiatry

## 2016-03-14 DIAGNOSIS — M779 Enthesopathy, unspecified: Secondary | ICD-10-CM

## 2016-03-14 MED ORDER — TRAMADOL HCL 50 MG PO TABS: 50.0000 mg | ORAL_TABLET | Freq: Two times a day (BID) | ORAL | 0 refills | Status: DC | PRN

## 2016-03-14 NOTE — Telephone Encounter (Deleted)
-----   Message from Ulla Gallo sent at 03/14/2016 10:00 AM EST ----- Regarding: Medication Rx Contact: [phone] PT saw Benjie Karvonen today for brace fitting.  At checkout she requested a Rx for Tramadol.  She explained she had talked to dr. Jacqualyn Posey about the Rx at her last visit but left without the prescription.   Pt would like a phone call - approved or denied  Thanks!

## 2016-03-14 NOTE — Telephone Encounter (Signed)
Entered in error

## 2016-03-14 NOTE — Telephone Encounter (Addendum)
-----   Message from Ulla Gallo sent at 03/14/2016 10:00 AM EST ----- Regarding: Medication Rx Contact: [phone] PT saw Benjie Karvonen today for brace fitting.  At checkout she requested a Rx for Tramadol.  She explained she had talked to dr. Jacqualyn Posey about the Rx at her last visit but left without the prescription.   Pt would like a phone call - approved or denied. Left message informing pt she would be able to pick up the Tramadol at the [facility]. Faxed rx to [facility].

## 2016-03-16 ENCOUNTER — Telehealth: Payer: Self-pay | Admitting: *Deleted

## 2016-03-16 ENCOUNTER — Ambulatory Visit (HOSPITAL_BASED_OUTPATIENT_CLINIC_OR_DEPARTMENT_OTHER): Payer: BLUE CROSS/BLUE SHIELD

## 2016-03-16 DIAGNOSIS — C561 Malignant neoplasm of right ovary: Secondary | ICD-10-CM

## 2016-03-16 DIAGNOSIS — Z95828 Presence of other vascular implants and grafts: Secondary | ICD-10-CM

## 2016-03-16 DIAGNOSIS — Z452 Encounter for adjustment and management of vascular access device: Secondary | ICD-10-CM | POA: Diagnosis not present

## 2016-03-16 DIAGNOSIS — C562 Malignant neoplasm of left ovary: Secondary | ICD-10-CM

## 2016-03-16 DIAGNOSIS — C563 Malignant neoplasm of bilateral ovaries: Secondary | ICD-10-CM

## 2016-03-16 MED ORDER — HEPARIN SOD (PORK) LOCK FLUSH 100 UNIT/ML IV SOLN
500.0000 [IU] | Freq: Once | INTRAVENOUS | Status: AC | PRN
  Administered 2016-03-16: 500 [IU] via INTRAVENOUS
  Filled 2016-03-16: qty 5

## 2016-03-16 MED ORDER — SODIUM CHLORIDE 0.9 % IJ SOLN
10.0000 mL | INTRAMUSCULAR | Status: DC | PRN
  Administered 2016-03-16: 10 mL via INTRAVENOUS
  Filled 2016-03-16: qty 10

## 2016-03-16 NOTE — Progress Notes (Signed)
Pt presents for adjuustment of [place] brace performed by [employer]

## 2016-03-16 NOTE — Telephone Encounter (Signed)
Pt states [facility] does not have her Tramadol fax. Left message [facility]. Left message informing pt the rx had been confirmed received fax by [facility] and I called it in today.

## 2016-04-20 ENCOUNTER — Other Ambulatory Visit: Payer: Self-pay | Admitting: Oncology

## 2016-04-21 ENCOUNTER — Ambulatory Visit (HOSPITAL_BASED_OUTPATIENT_CLINIC_OR_DEPARTMENT_OTHER): Payer: BLUE CROSS/BLUE SHIELD

## 2016-04-21 ENCOUNTER — Other Ambulatory Visit (HOSPITAL_BASED_OUTPATIENT_CLINIC_OR_DEPARTMENT_OTHER): Payer: BLUE CROSS/BLUE SHIELD

## 2016-04-21 ENCOUNTER — Ambulatory Visit (HOSPITAL_BASED_OUTPATIENT_CLINIC_OR_DEPARTMENT_OTHER): Payer: BLUE CROSS/BLUE SHIELD | Admitting: Oncology

## 2016-04-21 VITALS — BP 145/84 | HR 87 | Temp 99.0°F | Resp 18 | Ht 68.0 in | Wt 307.1 lb

## 2016-04-21 DIAGNOSIS — M1990 Unspecified osteoarthritis, unspecified site: Secondary | ICD-10-CM | POA: Diagnosis not present

## 2016-04-21 DIAGNOSIS — C561 Malignant neoplasm of right ovary: Secondary | ICD-10-CM

## 2016-04-21 DIAGNOSIS — Z780 Asymptomatic menopausal state: Secondary | ICD-10-CM

## 2016-04-21 DIAGNOSIS — C562 Malignant neoplasm of left ovary: Secondary | ICD-10-CM

## 2016-04-21 DIAGNOSIS — E039 Hypothyroidism, unspecified: Secondary | ICD-10-CM

## 2016-04-21 DIAGNOSIS — C563 Malignant neoplasm of bilateral ovaries: Secondary | ICD-10-CM

## 2016-04-21 DIAGNOSIS — Z95828 Presence of other vascular implants and grafts: Secondary | ICD-10-CM

## 2016-04-21 DIAGNOSIS — G620 Drug-induced polyneuropathy: Secondary | ICD-10-CM

## 2016-04-21 DIAGNOSIS — R21 Rash and other nonspecific skin eruption: Secondary | ICD-10-CM

## 2016-04-21 LAB — COMPREHENSIVE METABOLIC PANEL
ALBUMIN: 3.7 g/dL (ref 3.5–5.0)
ALK PHOS: 88 U/L (ref 40–150)
ALT: 25 U/L (ref 0–55)
AST: 19 U/L (ref 5–34)
Anion Gap: 10 mEq/L (ref 3–11)
BILIRUBIN TOTAL: 0.73 mg/dL (ref 0.20–1.20)
BUN: 10.6 mg/dL (ref 7.0–26.0)
CO2: 25 meq/L (ref 22–29)
Calcium: 9.6 mg/dL (ref 8.4–10.4)
Chloride: 104 mEq/L (ref 98–109)
Creatinine: 0.8 mg/dL (ref 0.6–1.1)
EGFR: 89 mL/min/{1.73_m2} — AB (ref >90)
GLUCOSE: 115 mg/dL (ref 70–140)
Potassium: 3.4 mEq/L — ABNORMAL LOW (ref 3.5–5.1)
SODIUM: 139 meq/L (ref 136–145)
TOTAL PROTEIN: 7.6 g/dL (ref 6.4–8.3)

## 2016-04-21 LAB — CBC WITH DIFFERENTIAL/PLATELET
BASO%: 0.3 % (ref 0.0–2.0)
Basophils Absolute: 0 10*3/uL (ref 0.0–0.1)
EOS ABS: 0.2 10*3/uL (ref 0.0–0.5)
EOS%: 1.5 % (ref 0.0–7.0)
HCT: 40.1 % (ref 34.8–46.6)
HEMOGLOBIN: 13.7 g/dL (ref 11.6–15.9)
LYMPH%: 21.9 % (ref 14.0–49.7)
MCH: 29.9 pg (ref 25.1–34.0)
MCHC: 34 g/dL (ref 31.5–36.0)
MCV: 87.7 fL (ref 79.5–101.0)
MONO#: 0.6 10*3/uL (ref 0.1–0.9)
MONO%: 4.9 % (ref 0.0–14.0)
NEUT%: 71.4 % (ref 38.4–76.8)
NEUTROS ABS: 9.3 10*3/uL — AB (ref 1.5–6.5)
Platelets: 277 10*3/uL (ref 145–400)
RBC: 4.58 10*6/uL (ref 3.70–5.45)
RDW: 13.1 % (ref 11.2–14.5)
WBC: 13 10*3/uL — AB (ref 3.9–10.3)
lymph#: 2.8 10*3/uL (ref 0.9–3.3)

## 2016-04-21 MED ORDER — HEPARIN SOD (PORK) LOCK FLUSH 100 UNIT/ML IV SOLN
500.0000 [IU] | Freq: Once | INTRAVENOUS | Status: AC | PRN
  Administered 2016-04-21: 500 [IU] via INTRAVENOUS
  Filled 2016-04-21: qty 5

## 2016-04-21 MED ORDER — SODIUM CHLORIDE 0.9 % IJ SOLN
10.0000 mL | INTRAMUSCULAR | Status: DC | PRN
  Administered 2016-04-21: 10 mL via INTRAVENOUS
  Filled 2016-04-21: qty 10

## 2016-04-21 NOTE — Progress Notes (Signed)
OFFICE PROGRESS NOTE   April 21, 2016   Physicians: Pamella Pert Le/ Everitt Amber, [name], (H.Mezer), S.Norris, Narda Amber, [facility] deDios. 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 Last imaging was CT AP 04-20-15. She has transferred gyn oncology follow up to Dr Denman George in [place] as of 01-2016, and will see Dr Denman George again 3 months from this visit.   Patient has a number of concerns, tho none clearly of concern from standpoint of previous ovarian cancer or that treatment. She denies abdominal or pelvic discomfort. Bowels move regularly, no bleeding, no bladder symptoms, no LE swelling. No problems with PAC, however she would like to have this removed, which seems very appropriate now.  She has not been exercising regularly. She has rash hands and forearms x several months, did not improve with topical steroid by PCP, denies known contact etiology. She has seen Dr Posey Pronto for neuropathy, which she describes as pain anterior thighs and hips particularly at night; she has not seen improvement with nortriptyline at hs in addition to previous gabapentin. Per Dr Serita Grit note, patient declined PT referral for back and declined orthopedics evaluation for carpal tunnel. No recent infectious illness.  Remainder of 10 point Review of Systems negative  PAC placed by IR 12-04-14, flushed today without difficulty. Patient prefers to keep this for now due to extremely difficult peripheral IV access. Genetics testing sent 12-17-14 normal ([employer]Next) Pre op CA 125 554 FLu vaccine 03-26-15    ONCOLOGIC HISTORY Patient had history of endometriosis and infertility, with no abnormal PAP smears but no recent gyn exams. She was in usual health until a few weeks of  intermittent low grade nausea, then <24 hours of progressively more severe right upper quadrant abdominal pain. She presented to [facility] ED with the abdominal pain on 11-09-14, with abdominal US not remarkable, then CT AP demonstrated bilateral solid and cystic ovarian masses in total measuring 14 x 10 x 11 cm. She was transferred to [facility] Dr Thurston Pounds, with preoperative CA 125 of 554. On 11-10-14 she had exploratory laparotomy with supracervical hysterectomy, BSO, infracolic omentectomy, bilateral pelvic lymphadenectomy and aortic node sampling. 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 . Surgical pathology from [facility] [phone]) from 11-10-14 had grade 3 clear cell carcinoma in 6 cm right ovary and 11 cm left ovary with capsules intact, bilateral fallopian tubes not involved, uterus and omentum not involved, no LVSI, for stage IB. No malignant cells in ascites or peritoneal washings. Her postoperative course was uncomplicated. [facility] multidisciplinary conference recommended 6 cycles of adjuvant taxol carboplatin. She will have 6 week post op visit with Dr Thurston Pounds. First carbo taxol was given 01-05-93, complicated by taxol reaction despite full premedication, and severe taxol aches. She was neutropenic on day 14 cycle 3 with ANC 0.6. [employer] gene panel normal, sent 12-17-14. She completed 6 cycles of carbo taxol on 03-20-15. CT AP 04-20-15 had no obvious persistent or recurrent disease.   Objective:  Vital signs in last 24 hours:  BP (!) 145/84 (BP Location: Right Arm, Patient Position: Sitting)   Pulse 87  Temp 99 F (37.2 C) (Oral)   Resp 18   Ht '5\' 8"'  (1.727 m)   Wt (!) 307 lb 1.6 oz (139.3 kg)   LMP 11/02/2014   SpO2 98%    BMI 46.69 kg/m  Weight up 10 lbs Alert, oriented and appropriate. Ambulatory without assistance. Morbidly obese.  No alopecia  HEENT:PERRL, sclerae not icteric. Oral mucosa moist without lesions, posterior pharynx clear.  Neck supple. No JVD.  Lymphatics:no cervical,supraclavicular, axillary adenopathy. Cannot appreciate any inguinal adenopathy. Resp: clear to auscultation bilaterally and normal percussion bilaterally Cardio: regular rate and rhythm. No gallop. GI: abdomen obese, soft, nontender, cannot appreciate mass or organomegaly. Normally active bowel sounds. Surgical incision not remarkable. Musculoskeletal/ Extremities: LE without pitting edema, cords, tenderness Neuro: no peripheral neuropathy. Otherwise nonfocal. PSYCH appropriate mood and affect Skin  Excoriated areas on fingers, hands, forearms, several areas of grouped lesions, no vesicles, no obvious suprainfection. Otherwise without ecchymosis, petechiae Portacath-without erythema or tenderness  Lab Results:  Results for orders placed or performed in visit on 04/21/16  CBC with Differential  Result Value Ref Range   WBC 13.0 (H) 3.9 - 10.3 10e3/uL   NEUT# 9.3 (H) 1.5 - 6.5 10e3/uL   HGB 13.7 11.6 - 15.9 g/dL   HCT 40.1 34.8 - 46.6 %   Platelets 277 145 - 400 10e3/uL   MCV 87.7 79.5 - 101.0 fL   MCH 29.9 25.1 - 34.0 pg   MCHC 34.0 31.5 - 36.0 g/dL   RBC 4.58 3.70 - 5.45 10e6/uL   RDW 13.1 11.2 - 14.5 %   lymph# 2.8 0.9 - 3.3 10e3/uL   MONO# 0.6 0.1 - 0.9 10e3/uL   Eosinophils Absolute 0.2 0.0 - 0.5 10e3/uL   Basophils Absolute 0.0 0.0 - 0.1 10e3/uL   NEUT% 71.4 38.4 - 76.8 %   LYMPH% 21.9 14.0 - 49.7 %   MONO% 4.9 0.0 - 14.0 %   EOS% 1.5 0.0 - 7.0 %   BASO% 0.3 0.0 - 2.0 %  Comprehensive metabolic panel  Result Value Ref Range   Sodium 139 136 - 145 mEq/L   Potassium 3.4 (L) 3.5 - 5.1 mEq/L   Chloride 104 98 - 109 mEq/L   CO2 25 22 - 29 mEq/L   Glucose 115 70 - 140 mg/dl   BUN 10.6 7.0 - 26.0 mg/dL    Creatinine 0.8 0.6 - 1.1 mg/dL   Total Bilirubin 0.73 0.20 - 1.20 mg/dL   Alkaline Phosphatase 88 40 - 150 U/L   AST 19 5 - 34 U/L   ALT 25 0 - 55 U/L   Total Protein 7.6 6.4 - 8.3 g/dL   Albumin 3.7 3.5 - 5.0 g/dL   Calcium 9.6 8.4 - 10.4 mg/dL   Anion Gap 10 3 - 11 mEq/L   EGFR 89 (L) >90 ml/min/1.73 m2    CA 125 available after visit is stable at 25, having been 25 also on 01-29-16 and 26 in 10-2015.  Studies/Results:  Mammograms were done at a mobile unit at her work in ~ 02-2016, unremarkable per patient, that report not in this EMR.  Medications: I have reviewed the patient's current medications.  DISCUSSION As she is now out a year from adjuvant treatment and clinically nothing of concern, she would like to have PAC removed. Request sent to IR for PAC removal.  Encouraged her to resume exercise, which she has always enjoyed and which will help with weight loss. [place]ed [place], including walking in  lap lanes; they have [employer] and live near [place]. Suggested stationary bike at [employer], as she was unable to tolerate return bike ride home with last attempt.   She would like referral to dermatology, done now by this MD, date and time good for patient. Will fax records to [facility] [place] For new patient apt Dr Harriett Sine on Jan 8 at 1020  Patient is aware that another medical oncologist will work with gyn oncology team beginning next year. Appointment made now for Dr Denman George 07-25-16, then further appointments either only gyn oncology or alternating with medical oncology per Dr Denman George at that visit.   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 CA 125 in normal range. She will see Dr Denman George in March with CA 125.  2. Morbid obesity:  Hopefully will begin water exercise.  Using CPAP 3.PAC in place: reasonable to remove  now out a year from completion of treatment and no known active disease. Request to IR.  4. Peripheral neuropathy from taxol, and other complaints not clearly this neuropathy. Suggest water exercise program. Continues gabapentin; nortriptyline not obviously helpful. Has follow up with Dr Posey Pronto  5.degenerative arthritis: needs weight loss 6.surgical menopause:  She is not complaining of hot flashes. Endogenous estrogen likely high with morbid obesity.  7. hypothyroidism: to follow up with PCP, on synthroid 8.rash UE, referral to dermatology 9.hx migraines, no problem with zofran  10.anaphylaxis to avelox, rash to sulfa. Tolerates keflex without difficulty 11. Up to date on mammograms, not in this EMR   All questions answered and she is in agreement with recommendations and plans. Time spent 25 min including >50% counseling and coordination of care. Cc Dr [name] and with other records to dermatologist   Evlyn Clines, MD   04/21/2016, 4:12 PM

## 2016-04-22 LAB — CA 125: Cancer Antigen (CA) 125: 25.2 U/mL (ref 0.0–38.1)

## 2016-04-23 ENCOUNTER — Encounter: Payer: Self-pay | Admitting: Oncology

## 2016-04-28 ENCOUNTER — Telehealth: Payer: Self-pay

## 2016-04-28 NOTE — Telephone Encounter (Signed)
Faxed records per Dr Marko Plume request

## 2016-04-28 NOTE — Telephone Encounter (Signed)
-----   Message from Gordy Levan, MD sent at 04/21/2016  4:10 PM EST ----- Regarding: fax records for dermatology referral Fax records including my note 12-14 when done, demographics, most recent CBC CMET, insurance to  [facility] [place] For new patient apt Dr Harriett Sine on Jan 8 at [place]

## 2016-05-05 ENCOUNTER — Encounter: Payer: Self-pay | Admitting: Internal Medicine

## 2016-05-05 ENCOUNTER — Ambulatory Visit (INDEPENDENT_AMBULATORY_CARE_PROVIDER_SITE_OTHER): Payer: BLUE CROSS/BLUE SHIELD | Admitting: Internal Medicine

## 2016-05-05 VITALS — BP 150/90 | HR 109 | Temp 97.9°F | Ht 68.0 in | Wt 313.0 lb

## 2016-05-05 DIAGNOSIS — R030 Elevated blood-pressure reading, without diagnosis of hypertension: Secondary | ICD-10-CM | POA: Diagnosis not present

## 2016-05-05 DIAGNOSIS — R059 Cough, unspecified: Secondary | ICD-10-CM

## 2016-05-05 DIAGNOSIS — R062 Wheezing: Secondary | ICD-10-CM | POA: Diagnosis not present

## 2016-05-05 DIAGNOSIS — R05 Cough: Secondary | ICD-10-CM

## 2016-05-05 HISTORY — DX: Elevated blood-pressure reading, without diagnosis of hypertension: R03.0

## 2016-05-05 MED ORDER — CEPHALEXIN 500 MG PO CAPS: 500.0000 mg | ORAL_CAPSULE | Freq: Four times a day (QID) | ORAL | 0 refills | Status: AC

## 2016-05-05 MED ORDER — HYDROCODONE-HOMATROPINE 5-1.5 MG/5ML PO SYRP: 5.0000 mL | ORAL_SOLUTION | Freq: Four times a day (QID) | ORAL | 0 refills | Status: AC | PRN

## 2016-05-05 MED ORDER — MELOXICAM 15 MG PO TABS: 15.0000 mg | ORAL_TABLET | Freq: Every day | ORAL | 3 refills | Status: DC | PRN

## 2016-05-05 MED ORDER — PREDNISONE 10 MG PO TABS: ORAL_TABLET | ORAL | 0 refills | Status: DC

## 2016-05-05 NOTE — Assessment & Plan Note (Signed)
BP Readings from Last 3 Encounters:  05/05/16 (!) 150/90  04/21/16 (!) 145/84  02/24/16 100/68  elevated recent likely situational,  to f/u any worsening symptoms or concerns, cont to monitor closely at home

## 2016-05-05 NOTE — Assessment & Plan Note (Signed)
Mild to mod, for short course low dose predpac asd,  to f/u any worsening symptoms or concerns

## 2016-05-05 NOTE — Patient Instructions (Signed)
Please take all new medication as prescribed  - the antibiotic, cough medicine if needed, prednisone, and the anti-inflammatory for pain  Please continue all other medications as before, and refills have been done if requested.  Please have the pharmacy call with any other refills you may need.  Please keep your appointments with your specialists as you may have planned

## 2016-05-05 NOTE — Progress Notes (Signed)
Pre visit review using our clinic review tool, if applicable. No additional management support is needed unless otherwise documented below in the visit note. 

## 2016-05-05 NOTE — Progress Notes (Signed)
Subjective:    Patient ID: [name], female    DOB: 23-Dec-1970, 45 y.o.   MRN: [id number]  HPI Here with acute onset mild to mod 2-3 days ST, HA, general weakness and malaise, with prod cough greenish sputum and now wheeze and mild sob since yesterdya,, but Pt denies chest pain, orthopnea, PND, increased LE swelling, palpitations, dizziness or syncope. Cough keeps her up at night.  Also even prior to illness has been having worsening msk pain that limits her activity, and wt increasing. Denies urinary symptoms such as dysuria, frequency, urgency, flank pain, hematuria or n/v, fever, chills.  Denies worsening reflux, abd pain, dysphagia, n/v, bowel change or blood.  BP usually better controlled at home at < 140/90 Past Medical History:  Diagnosis Date  . Anxiety   . Endometriosis   . Exercise-induced asthma 09/08/2015  . Ovarian cancer, bilateral ([place]) 11/28/2014  . Thyroid disease    Past Surgical History:  Procedure Laterality Date  . ABDOMINAL HYSTERECTOMY  11/10/14   at [facility], Exp lap, supracervical hyst, BSO, infracolic omentectomy, lymphadenectomy, aortic lymph node sampling    reports that she has never smoked. She has never used smokeless tobacco. She reports that she drinks alcohol. She reports that she does not use drugs. family history includes Atrial fibrillation in her father; Dementia in her paternal grandfather and paternal grandmother; Diabetes in her maternal aunt and mother; Healthy in her brother; Heart attack in her maternal grandfather; Heart failure in her maternal grandmother; Hypertension in her mother; Stroke in her mother. Allergies  Allergen Reactions  . Moxifloxacin Anaphylaxis    needs epinephrine shot  . Quinolones Anaphylaxis, Shortness Of Breath and Swelling  . Doxycycline Nausea And Vomiting  . Escitalopram Oxalate Other (See Comments)     fatigue  . Sulfonamide Derivatives Swelling and Rash    needs epinephrine shot--rash and lip swelling   Current  Outpatient Prescriptions on File Prior to Visit  Medication Sig Dispense Refill  . albuterol (PROVENTIL HFA;VENTOLIN HFA) 108 (90 Base) MCG/ACT inhaler Inhale 2 puffs into the lungs every 6 (six) hours as needed for wheezing or shortness of breath. 1 Inhaler 2  . ALPRAZolam (XANAX) 1 MG tablet Take 1 tablet (1 mg total) by mouth 2 (two) times daily as needed for anxiety. 60 tablet 5  . AMBULATORY NON FORMULARY MEDICATION Medication Name: 1 manuel massage every 2 weeks 1 each 0  . aspirin-acetaminophen-caffeine (EXCEDRIN MIGRAINE) [id number] MG per tablet Take 2 tablets by mouth 2 (two) times daily as needed for headache or migraine.    . Cyanocobalamin (VITAMIN B-12 PO) Take 2 tablets by mouth daily.    . cyclobenzaprine (FLEXERIL) 5 MG tablet Take 1 tablet (5 mg total) by mouth 2 (two) times daily. 60 tablet 5  . diclofenac (VOLTAREN) 75 MG EC tablet Take 1 tablet (75 mg total) by mouth 2 (two) times daily. 30 tablet 1  . estradiol (CLIMARA) 0.05 mg/24hr patch Place 1 patch (0.05 mg total) onto the skin once a week. (Patient not taking: Reported on 04/21/2016) 4 patch 12  . fluocinonide cream (LIDEX) [id number] % Apply 1 application topically 2 (two) times daily. 60 g 0  . gabapentin (NEURONTIN) 600 MG tablet Take 2 tablets (1,200 mg total) by mouth 3 (three) times daily. 360 tablet 11  . hydrochlorothiazide (MICROZIDE) 12.5 MG capsule Take 1 capsule (12.5 mg total) by mouth daily. 90 capsule 3  . levothyroxine (SYNTHROID, LEVOTHROID) 50 MCG tablet Take 1 tablet (50 mcg total) by  mouth daily before breakfast. 90 tablet 3  . lidocaine-prilocaine (EMLA) cream Apply to port 1-2 hours before access with needle. Cover with plastic wrap. 30 g 1  . nortriptyline (PAMELOR) 10 MG capsule Take nortriptyline 10mg  at bedtime for 2 week, then increase to 2 tablet at bedtime 60 capsule 11  . pantoprazole (PROTONIX) 40 MG tablet Take 1 tablet twice a day for 2 weeks then 1 tablet daily. 45 tablet 0  . sodium chloride  (OCEAN) 0.65 % SOLN nasal spray Place 1 spray into both nostrils daily as needed for congestion.    . sucralfate (CARAFATE) 1 G tablet Take 1 tablet (1 g total) by mouth 4 (four) times daily -  before meals and at bedtime. (Patient taking differently: Take 1 g by mouth at bedtime. ) 120 tablet 2  . SUMAtriptan (IMITREX) 100 MG tablet Take 1 tablet (100 mg total) by mouth every 2 (two) hours as needed for migraine. May repeat in 2 hours if headache persists or recurs. (Patient not taking: Reported on 04/21/2016) 10 tablet 11  . tetrahydrozoline-zinc (VISINE-AC) 0.05-0.25 % ophthalmic solution Place 2 drops into both eyes 3 (three) times daily as needed (dry eyes).    . traMADol (ULTRAM) 50 MG tablet Take 1 tablet (50 mg total) by mouth every 12 (twelve) hours as needed. 30 tablet 0  . venlafaxine XR (EFFEXOR XR) 150 MG 24 hr capsule Take 1 capsule (150 mg total) by mouth daily with breakfast. 90 capsule 3   No current facility-administered medications on file prior to visit.    Review of Systems  Constitutional: Negative for unusual diaphoresis or night sweats HENT: Negative for ear swelling or discharge Eyes: Negative for worsening visual haziness  Respiratory: Negative for choking and stridor.   Gastrointestinal: Negative for distension or worsening eructation Genitourinary: Negative for retention or change in urine volume.  Musculoskeletal: Negative for other MSK pain or swelling Skin: Negative for color change and worsening wound Neurological: Negative for tremors and numbness other than noted  Psychiatric/Behavioral: Negative for decreased concentration or agitation other than above   All other system neg per pt    Objective:   Physical Exam LMP 11/02/2014  VS noted,  Constitutional: Pt appears in no apparent distress HENT: Head: NCAT.  Right Ear: External ear normal.  Left Ear: External ear normal.  Eyes: . Pupils are equal, round, and reactive to light. Conjunctivae and EOM are  normal. Bilat tm's with mild erythema.  Max sinus areas non tender.  Pharynx with mild erythema, no exudate Neck: Normal range of motion. Neck supple.  Cardiovascular: Normal rate and regular rhythm.   Pulmonary/Chest: Effort normal and breath sounds decreased without rales but with few scattered rhonchi and wheezing.  Neurological: Pt is alert. Not confused , motor grossly intact Skin: Skin is warm. No rash, no LE edema No joint effusions or tenderness Psychiatric: Pt behavior is normal. No agitation.  No other new exam findings    Assessment & Plan:

## 2016-05-05 NOTE — Assessment & Plan Note (Signed)
Mild to mod, c/w bronchitis vs pna, declines cxr, for antibx course, cough med prn,  to f/u any worsening symptoms or concerns 

## 2016-05-11 ENCOUNTER — Other Ambulatory Visit: Payer: Self-pay | Admitting: Radiology

## 2016-05-12 ENCOUNTER — Ambulatory Visit (HOSPITAL_COMMUNITY)
Admission: RE | Admit: 2016-05-12 | Discharge: 2016-05-12 | Disposition: A | Discharge status: Home / Self Care | Payer: BLUE CROSS/BLUE SHIELD | Source: Ambulatory Visit | Attending: Oncology | Admitting: Oncology

## 2016-05-12 ENCOUNTER — Encounter (HOSPITAL_COMMUNITY): Payer: Self-pay

## 2016-05-12 DIAGNOSIS — J45990 Exercise induced bronchospasm: Secondary | ICD-10-CM | POA: Diagnosis not present

## 2016-05-12 DIAGNOSIS — Z9221 Personal history of antineoplastic chemotherapy: Secondary | ICD-10-CM | POA: Insufficient documentation

## 2016-05-12 DIAGNOSIS — F419 Anxiety disorder, unspecified: Secondary | ICD-10-CM | POA: Insufficient documentation

## 2016-05-12 DIAGNOSIS — E079 Disorder of thyroid, unspecified: Secondary | ICD-10-CM | POA: Diagnosis not present

## 2016-05-12 DIAGNOSIS — Z452 Encounter for adjustment and management of vascular access device: Secondary | ICD-10-CM | POA: Diagnosis present

## 2016-05-12 DIAGNOSIS — Z8543 Personal history of malignant neoplasm of ovary: Secondary | ICD-10-CM | POA: Diagnosis not present

## 2016-05-12 DIAGNOSIS — Z95828 Presence of other vascular implants and grafts: Secondary | ICD-10-CM

## 2016-05-12 HISTORY — DX: Other specified postprocedural states: Z98.890

## 2016-05-12 HISTORY — PX: IR GENERIC HISTORICAL: IMG1180011

## 2016-05-12 HISTORY — DX: Other specified postprocedural states: R11.2

## 2016-05-12 LAB — CBC WITH DIFFERENTIAL/PLATELET
BASOS ABS: 0 10*3/uL (ref 0.0–0.1)
Basophils Relative: 0 %
Eosinophils Absolute: 0.1 10*3/uL (ref 0.0–0.7)
Eosinophils Relative: 1 %
HCT: 38.9 % (ref 36.0–46.0)
HEMOGLOBIN: 13.5 g/dL (ref 12.0–15.0)
LYMPHS ABS: 3.7 10*3/uL (ref 0.7–4.0)
LYMPHS PCT: 32 %
MCH: 30.1 pg (ref 26.0–34.0)
MCHC: 34.7 g/dL (ref 30.0–36.0)
MCV: 86.8 fL (ref 78.0–100.0)
Monocytes Absolute: 0.7 10*3/uL (ref 0.1–1.0)
Monocytes Relative: 6 %
Neutro Abs: 7.2 10*3/uL (ref 1.7–7.7)
Neutrophils Relative %: 61 %
Platelets: 277 10*3/uL (ref 150–400)
RBC: 4.48 MIL/uL (ref 3.87–5.11)
RDW: 13.1 % (ref 11.5–15.5)
WBC: 11.7 10*3/uL — AB (ref 4.0–10.5)

## 2016-05-12 LAB — PROTIME-INR
INR: 0.9
Prothrombin Time: 12.2 seconds (ref 11.4–15.2)

## 2016-05-12 MED ORDER — MIDAZOLAM HCL 2 MG/2ML IJ SOLN
INTRAMUSCULAR | Status: AC
  Filled 2016-05-12: qty 6

## 2016-05-12 MED ORDER — NALOXONE HCL 0.4 MG/ML IJ SOLN
INTRAMUSCULAR | Status: AC
  Filled 2016-05-12: qty 1

## 2016-05-12 MED ORDER — FENTANYL CITRATE (PF) 100 MCG/2ML IJ SOLN
INTRAMUSCULAR | Status: AC | PRN
  Administered 2016-05-12 (×2): 50 ug via INTRAVENOUS
  Administered 2016-05-12: 100 ug via INTRAVENOUS
  Administered 2016-05-12 (×2): 50 ug via INTRAVENOUS
  Administered 2016-05-12: 100 ug via INTRAVENOUS

## 2016-05-12 MED ORDER — LIDOCAINE-EPINEPHRINE (PF) 2 %-1:200000 IJ SOLN
INTRAMUSCULAR | Status: AC
  Filled 2016-05-12: qty 20

## 2016-05-12 MED ORDER — MIDAZOLAM HCL 2 MG/2ML IJ SOLN
INTRAMUSCULAR | Status: AC
  Filled 2016-05-12: qty 2

## 2016-05-12 MED ORDER — CEFAZOLIN SODIUM-DEXTROSE 2-4 GM/100ML-% IV SOLN
2.0000 g | INTRAVENOUS | Status: AC
  Administered 2016-05-12: 2 g via INTRAVENOUS
  Filled 2016-05-12: qty 100

## 2016-05-12 MED ORDER — FENTANYL CITRATE (PF) 100 MCG/2ML IJ SOLN
INTRAMUSCULAR | Status: AC
  Filled 2016-05-12: qty 6

## 2016-05-12 MED ORDER — SODIUM CHLORIDE 0.9 % IV SOLN
INTRAVENOUS | Status: DC
  Administered 2016-05-12: 09:00:00 via INTRAVENOUS

## 2016-05-12 MED ORDER — FLUMAZENIL 0.5 MG/5ML IV SOLN
INTRAVENOUS | Status: AC
  Filled 2016-05-12: qty 5

## 2016-05-12 MED ORDER — MIDAZOLAM HCL 2 MG/2ML IJ SOLN
INTRAMUSCULAR | Status: AC | PRN
  Administered 2016-05-12: 2 mg via INTRAVENOUS
  Administered 2016-05-12: 1 mg via INTRAVENOUS
  Administered 2016-05-12: 0.5 mg via INTRAVENOUS
  Administered 2016-05-12: 1 mg via INTRAVENOUS
  Administered 2016-05-12: 1.5 mg via INTRAVENOUS
  Administered 2016-05-12 (×2): 1 mg via INTRAVENOUS
  Administered 2016-05-12: 2 mg via INTRAVENOUS

## 2016-05-12 MED ORDER — FENTANYL CITRATE (PF) 100 MCG/2ML IJ SOLN
INTRAMUSCULAR | Status: AC
  Filled 2016-05-12: qty 2

## 2016-05-12 NOTE — Procedures (Signed)
Successful removal of right anterior chest wall port-a-cath. EBL: Minimal No immediate post procedural complications.   Jay Orilla Templeman, MD Pager #: [phone]  

## 2016-05-12 NOTE — H&P (Signed)
Referring Physician(s): Livesay,Lennis P  Supervising Physician: Sandi Mariscal  Patient Status:  [facility] OP  Chief Complaint:  "I'm here to get my port out"  Subjective: Pt familiar to IR service from prior rt IJ port a cath placement on 12/04/14. 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 She has completed treatment and has no known active disease. She presents again today for Port-A-Cath removal.She denies fever, headache , chest pain, dyspnea, abdominal/back pain, nausea, vomiting or abnormal bleeding. She does have occasional cough. Past Medical History:  Diagnosis Date  . Anxiety   . Elevated blood pressure, situational 05/05/2016  . Endometriosis   . Exercise-induced asthma 09/08/2015  . Ovarian cancer, bilateral ([place]) 11/28/2014  . PONV (postoperative nausea and vomiting)   . Thyroid disease    Past Surgical History:  Procedure Laterality Date  . ABDOMINAL HYSTERECTOMY  11/10/14   at [facility], Exp lap, supracervical hyst, BSO, infracolic omentectomy, lymphadenectomy, aortic lymph node sampling     Allergies: Moxifloxacin; Quinolones; Doxycycline; Escitalopram oxalate; and Sulfonamide derivatives  Medications: Prior to Admission medications   Medication Sig Start Date End Date Taking? Authorizing Provider  ALPRAZolam Duanne Moron) 1 MG tablet Take 1 tablet (1 mg total) by mouth 2 (two) times daily as needed for anxiety. 11/19/15  Yes Biagio Borg, MD  cephALEXin (KEFLEX) 500 MG capsule Take 1 capsule (500 mg total) by mouth 4 (four) times daily. 05/05/16 05/15/16 Yes Biagio Borg, MD  gabapentin (NEURONTIN) 600 MG tablet Take 2 tablets (1,200 mg total) by mouth 3 (three) times daily. 02/24/16  Yes Donika K Patel, DO  hydrochlorothiazide (MICROZIDE) 12.5 MG capsule Take 1 capsule (12.5 mg total) by mouth daily. 09/08/15  Yes Biagio Borg, MD  levothyroxine (SYNTHROID, LEVOTHROID) 50 MCG tablet Take 1 tablet (50 mcg total) by mouth daily before  breakfast. 10/07/15  Yes Biagio Borg, MD  lidocaine-prilocaine (EMLA) cream Apply to port 1-2 hours before access with needle. Cover with plastic wrap. 11/06/15  Yes Lennis Marion Downer, MD  nortriptyline (PAMELOR) 10 MG capsule Take nortriptyline 10mg  at bedtime for 2 week, then increase to 2 tablet at bedtime 02/24/16  Yes Donika K Patel, DO  predniSONE (DELTASONE) 10 MG tablet 2 tabs by mouth per day for 5 days 05/05/16  Yes Biagio Borg, MD  sodium chloride (OCEAN) 0.65 % SOLN nasal spray Place 1 spray into both nostrils daily as needed for congestion.   Yes Historical Provider, MD  tetrahydrozoline-zinc (VISINE-AC) 0.05-0.25 % ophthalmic solution Place 2 drops into both eyes 3 (three) times daily as needed (dry eyes).   Yes Historical Provider, MD  traMADol (ULTRAM) 50 MG tablet Take 1 tablet (50 mg total) by mouth every 12 (twelve) hours as needed. 03/14/16  Yes Trula Slade, DPM  venlafaxine XR (EFFEXOR XR) 150 MG 24 hr capsule Take 1 capsule (150 mg total) by mouth daily with breakfast. 11/19/15  Yes Biagio Borg, MD  albuterol (PROVENTIL HFA;VENTOLIN HFA) 108 (90 Base) MCG/ACT inhaler Inhale 2 puffs into the lungs every 6 (six) hours as needed for wheezing or shortness of breath. 11/26/15   Biagio Borg, MD  AMBULATORY NON FORMULARY MEDICATION Medication Name: 1 manuel massage every 2 weeks 06/25/15   Lyndal Pulley, DO  aspirin-acetaminophen-caffeine (EXCEDRIN MIGRAINE) [id number] MG per tablet Take 2 tablets by mouth 2 (two) times daily as needed for headache or migraine.    Historical Provider, MD  Cyanocobalamin (VITAMIN B-12 PO) Take  2 tablets by mouth daily.    Historical Provider, MD  cyclobenzaprine (FLEXERIL) 5 MG tablet Take 1 tablet (5 mg total) by mouth 2 (two) times daily. 11/06/15   Donika Keith Rake, DO  diclofenac (VOLTAREN) 75 MG EC tablet Take 1 tablet (75 mg total) by mouth 2 (two) times daily. 11/06/15   Trula Slade, DPM  estradiol (CLIMARA) 0.05 mg/24hr patch Place 1 patch  (0.05 mg total) onto the skin once a week. Patient not taking: Reported on 04/21/2016 01/29/16   Everitt Amber, MD  fluocinonide cream (LIDEX) [id number] % Apply 1 application topically 2 (two) times daily. 11/19/15   Biagio Borg, MD  HYDROcodone-homatropine [facility]) 5-1.5 MG/5ML syrup Take 5 mLs by mouth every 6 (six) hours as needed for cough. 05/05/16 05/15/16  Biagio Borg, MD  meloxicam (MOBIC) 15 MG tablet Take 1 tablet (15 mg total) by mouth daily as needed for pain. 05/05/16   Biagio Borg, MD  pantoprazole (PROTONIX) 40 MG tablet Take 1 tablet twice a day for 2 weeks then 1 tablet daily. 03/26/15   Lennis Marion Downer, MD  sucralfate (CARAFATE) 1 G tablet Take 1 tablet (1 g total) by mouth 4 (four) times daily -  before meals and at bedtime. Patient taking differently: Take 1 g by mouth at bedtime.  01/22/15   Lennis Marion Downer, MD  SUMAtriptan (IMITREX) 100 MG tablet Take 1 tablet (100 mg total) by mouth every 2 (two) hours as needed for migraine. May repeat in 2 hours if headache persists or recurs. Patient not taking: Reported on 04/21/2016 11/24/15   Biagio Borg, MD     Vital Signs: BP 135/83   Pulse 87   Temp 97.9 F (36.6 C) (Oral)   Resp 14   Ht 5\' 8"  (1.727 m)   LMP 11/02/2014   SpO2 98%   Physical Exam Awake, alert. Chest with distant but clear breath sounds bilaterally. Clean, intact,NT right chest wall Port-A-Cath. Heart with regular rate and rhythm.Abdomen soft, obese, positive bowel sounds,nontender; lower extremities with no significant edema.  Imaging: No results found.  Labs:  CBC:  Recent Labs  09/04/15 1224 01/29/16 1327 04/21/16 1439 05/12/16 0827  WBC 9.0 9.5 13.0* 11.7*  HGB 13.0 13.1 13.7 13.5  HCT 39.4 39.8 40.1 38.9  PLT 272 260 277 277    COAGS: No results for input(s): INR, APTT in the last 8760 hours.  BMP:  Recent Labs  08/31/15 0932 09/04/15 1224 01/29/16 1327 04/21/16 1439  NA 140 139 140 139  K 3.5 3.5 3.4* 3.4*  CL  --  103  --   --     CO2 24 26 26 25   GLUCOSE 156* 113* 143* 115  BUN 7.4 9 11.1 10.6  CALCIUM 9.2 9.3 9.2 9.6  CREATININE 0.8 0.73 0.8 0.8  GFRNONAA  --  >60  --   --   GFRAA  --  >60  --   --     LIVER FUNCTION TESTS:  Recent Labs  08/31/15 0932 09/04/15 1224 01/29/16 1327 04/21/16 1439  BILITOT 0.79 0.9 0.63 0.73  AST 13 24 20 19   ALT 15 26 26 25   ALKPHOS 59 59 87 88  PROT 6.8 7.3 7.1 7.6  ALBUMIN 3.4* 4.0 3.6 3.7    Assessment and Plan: Pt with history of bilateral clear cell ovarian carcinoma 2016 and prior surgery/chemotherapy. She has completed treatment and has no known active disease. She presents today for Port-A-Cath removal.Details/risks of  procedure, including but not limited to, internal bleeding, infection, injury to adjacent structures, discussed with patient with her understanding and consent.  Electronically Signed: D. Rowe Robert 05/12/2016, 9:02 AM   I spent a total of 20 minutes at the the patient's bedside AND on the patient's hospital floor or unit, greater than 50% of which was counseling/coordinating care for port a cath removal

## 2016-05-12 NOTE — Discharge Instructions (Signed)
Implanted Port Removal, Care After Introduction Refer to this sheet in the next few weeks. These instructions provide you with information about caring for yourself after your procedure. Your health care provider may also give you more specific instructions. Your treatment has been planned according to current medical practices, but problems sometimes occur. Call your health care provider if you have any problems or questions after your procedure. What can I expect after the procedure? After the procedure, it is common to have:  Soreness or pain near your incision.  Some swelling or bruising near your incision. Follow these instructions at home: Medicines  Take over-the-counter and prescription medicines only as told by your health care provider.  If you were prescribed an antibiotic medicine, take it as told by your health care provider. Do not stop taking the antibiotic even if you start to feel better. Bathing  Do not take baths, swim, or use a hot tub until your health care provider approves. Ask your health care provider if you can take showers. You may only be allowed to take sponge baths for bathing. Incision care  Follow instructions from your health care provider about how to take care of your incision. Make sure you:  Wash your hands with soap and water before you change your bandage (dressing). If soap and water are not available, use hand sanitizer.  Change your dressing as told by your health care provider.  Keep your dressing dry.  Leave stitches (sutures), skin glue, or adhesive strips in place. These skin closures may need to stay in place for 2 weeks or longer. If adhesive strip edges start to loosen and curl up, you may trim the loose edges. Do not remove adhesive strips completely unless your health care provider tells you to do that.  Check your incision area every day for signs of infection. Check for:  More redness, swelling, or pain.  More fluid or  blood.  Warmth.  Pus or a bad smell. Driving  If you received a sedative, do not drive for 24 hours after the procedure.  If you did not receive a sedative, ask your health care provider when it is safe to drive. Activity  Return to your normal activities as told by your health care provider. Ask your health care provider what activities are safe for you.  Until your health care provider says it is safe:  Do not lift anything that is heavier than 10 lb (4.5 kg).  Do not do activities that involve lifting your arms over your head. General instructions  Do not use any tobacco products, such as cigarettes, chewing tobacco, and e-cigarettes. Tobacco can delay healing. If you need help quitting, ask your health care provider.  Keep all follow-up visits as told by your health care provider. This is important. Contact a health care provider if:  You have more redness, swelling, or pain around your incision.  You have more fluid or blood coming from your incision.  Your incision feels warm to the touch.  You have pus or a bad smell coming from your incision.  You have a fever.  You have pain that is not relieved by your pain medicine. Get help right away if:  You have chest pain.  You have difficulty breathing. This information is not intended to replace advice given to you by your health care provider. Make sure you discuss any questions you have with your health care provider. Document Released: 04/06/2015 Document Revised: 10/01/2015 Document Reviewed: 01/28/2015  2017  Elsevier  Moderate Conscious Sedation, Adult, Care After These instructions provide you with information about caring for yourself after your procedure. Your health care provider may also give you more specific instructions. Your treatment has been planned according to current medical practices, but problems sometimes occur. Call your health care provider if you have any problems or questions after your  procedure. What can I expect after the procedure? After your procedure, it is common:  To feel sleepy for several hours.  To feel clumsy and have poor balance for several hours.  To have poor judgment for several hours.  To vomit if you eat too soon. Follow these instructions at home: For at least 24 hours after the procedure:   Do not:  Participate in activities where you could fall or become injured.  Drive.  Use heavy machinery.  Drink alcohol.  Take sleeping pills or medicines that cause drowsiness.  Make important decisions or sign legal documents.  Take care of children on your own.  Rest. Eating and drinking  Follow the diet recommended by your health care provider.  If you vomit:  Drink water, juice, or soup when you can drink without vomiting.  Make sure you have little or no nausea before eating solid foods. General instructions  Have a responsible adult stay with you until you are awake and alert.  Take over-the-counter and prescription medicines only as told by your health care provider.  If you smoke, do not smoke without supervision.  Keep all follow-up visits as told by your health care provider. This is important. Contact a health care provider if:  You keep feeling nauseous or you keep vomiting.  You feel light-headed.  You develop a rash.  You have a fever. Get help right away if:  You have trouble breathing. This information is not intended to replace advice given to you by your health care provider. Make sure you discuss any questions you have with your health care provider. Document Released: 02/13/2013 Document Revised: 09/28/2015 Document Reviewed: 08/15/2015 Elsevier Interactive Patient Education  2017 [employer].

## 2016-05-19 ENCOUNTER — Encounter: Payer: Self-pay | Admitting: Internal Medicine

## 2016-05-19 ENCOUNTER — Other Ambulatory Visit: Payer: Self-pay | Admitting: Podiatry

## 2016-05-19 DIAGNOSIS — F411 Generalized anxiety disorder: Secondary | ICD-10-CM

## 2016-05-19 DIAGNOSIS — R21 Rash and other nonspecific skin eruption: Secondary | ICD-10-CM

## 2016-05-19 MED ORDER — TRAMADOL HCL 50 MG PO TABS: 50.0000 mg | ORAL_TABLET | Freq: Two times a day (BID) | ORAL | 0 refills | Status: DC | PRN

## 2016-05-19 NOTE — Telephone Encounter (Signed)
Refilled Tramadol for 10 days and pt needs an appt prior to future refills.  Called orders to [facility] [id number].

## 2016-05-29 ENCOUNTER — Ambulatory Visit
Admission: RE | Admit: 2016-05-29 | Discharge: 2016-05-29 | Disposition: A | Discharge status: Home / Self Care | Payer: BLUE CROSS/BLUE SHIELD | Source: Ambulatory Visit | Attending: Podiatry | Admitting: Podiatry

## 2016-05-29 MED ORDER — GADOBENATE DIMEGLUMINE 529 MG/ML IV SOLN
20.0000 mL | Freq: Once | INTRAVENOUS | Status: AC | PRN
  Administered 2016-05-29: 20 mL via INTRAVENOUS

## 2016-06-03 ENCOUNTER — Encounter: Payer: Self-pay | Admitting: Podiatry

## 2016-06-03 ENCOUNTER — Ambulatory Visit (INDEPENDENT_AMBULATORY_CARE_PROVIDER_SITE_OTHER): Payer: BLUE CROSS/BLUE SHIELD | Admitting: Podiatry

## 2016-06-03 DIAGNOSIS — M19072 Primary osteoarthritis, left ankle and foot: Secondary | ICD-10-CM

## 2016-06-03 DIAGNOSIS — D1724 Benign lipomatous neoplasm of skin and subcutaneous tissue of left leg: Secondary | ICD-10-CM | POA: Diagnosis not present

## 2016-06-03 NOTE — Patient Instructions (Signed)

## 2016-06-08 NOTE — Progress Notes (Signed)
Subjective: 46 year old female presents the office today for concerns of continued pain to the outside aspect the left foot. She feels that there is a sister of mass in the site of her ankle which is causing much of the discomfort. She presents today for follow-up evaluation after having the MRI. She previously has tried bracing and she is awaiting this to be returned. We cemented back for modifications. She states that she's having difficulty with becoming more active this is becoming frustrating for her and she cannot do the activities that she would like to do. She did discuss options to remove the mass in the outside aspect of the ankle. She feels that this is was causing her pain. Denies any systemic complaints such as fevers, chills, nausea, vomiting. No acute changes since last appointment, and no other complaints at this time.   Objective: AAO x3, NAD DP/PT pulses palpable bilaterally, CRT less than 3 seconds N there is tenderness mostly on the lateral aspect of the ankle just distal to the lateral malleolus. There is no identifiable soft tissue mass present There does appear to be a fullness this area and appears to be more adipose tissue. There is mild discomfort on the sinus tarsi. There is no pain in the equal joint palpation. No pain with ankle or subtalar range of motion.  No open lesions or pre-ulcerative lesions.  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  -MRI results were discussed the patient. See below. -After long discussion with the patient regarding treatment options and we'll get some of her pain is still coming from the ankle or subtalar joint itself however she feels that the pain is more from the mass on the outside  aspect of ankle. Discussed the MRI does not reveal soft tissue mass and over the markers this is adipose tissue. She is asking if this can be removed. Discussed that this is not a guarantee of relief there is a high chance this can come back. She states that given her pain she feels that this is with coming from his foot have the area removed. -I discussed with her excision of adipose tissue/lipoma. -The incision placement as well as the postoperative course was discussed with the patient. I discussed risks of the surgery which include, but not limited to, infection, bleeding, pain, swelling, need for further surgery, delayed or nonhealing, painful or ugly scar, numbness or sensation changes, over/under correction, recurrence, transfer lesions, further deformity, hardware failure, DVT/PE, loss of toe/foot. Patient understands these risks and wishes to proceed with surgery. The surgical consent was reviewed with the patient all 3 pages were signed. No promises or guarantees were given to the outcome of the procedure. All questions were answered to the best of my ability. Before the surgery the patient was encouraged to call the office if there is any further questions. The surgery will be performed at the [facility] on an outpatient basis. -Cam boot was given to her fracture surgery. -Patient encouraged to call the office with any questions, concerns, change in symptoms.    MRI 05/29/16 IMPRESSION 1. Soft tissue edema about the distal left leg. 2. No acute osseous abnormality. Minor degenerative subchondral cystic change about the ankle and subtalar joints as well as midfoot articulations with minimal clearing of the ankle joint cartilage. 3. The  tendons and ligaments crossing the ankle joint appear intact.  Celesta Gentile, DPM

## 2016-06-09 ENCOUNTER — Telehealth: Payer: Self-pay | Admitting: Internal Medicine

## 2016-06-09 NOTE — Telephone Encounter (Signed)
Rec'd from Skin Surgery forward 1 page to [place]

## 2016-06-16 ENCOUNTER — Encounter: Payer: Self-pay | Admitting: Nurse Practitioner

## 2016-06-16 ENCOUNTER — Ambulatory Visit (INDEPENDENT_AMBULATORY_CARE_PROVIDER_SITE_OTHER): Payer: BLUE CROSS/BLUE SHIELD | Admitting: Nurse Practitioner

## 2016-06-16 VITALS — BP 140/94 | HR 93 | Temp 98.5°F | Ht 68.0 in | Wt 305.0 lb

## 2016-06-16 DIAGNOSIS — R0981 Nasal congestion: Secondary | ICD-10-CM | POA: Diagnosis not present

## 2016-06-16 DIAGNOSIS — F419 Anxiety disorder, unspecified: Secondary | ICD-10-CM

## 2016-06-16 DIAGNOSIS — J309 Allergic rhinitis, unspecified: Secondary | ICD-10-CM

## 2016-06-16 DIAGNOSIS — I10 Essential (primary) hypertension: Secondary | ICD-10-CM | POA: Diagnosis not present

## 2016-06-16 MED ORDER — MONTELUKAST SODIUM 10 MG PO TABS: 10.0000 mg | ORAL_TABLET | Freq: Every day | ORAL | 3 refills | Status: DC

## 2016-06-16 MED ORDER — ALPRAZOLAM 1 MG PO TABS: 1.0000 mg | ORAL_TABLET | Freq: Two times a day (BID) | ORAL | 0 refills | Status: DC | PRN

## 2016-06-16 MED ORDER — LISINOPRIL 5 MG PO TABS: 5.0000 mg | ORAL_TABLET | Freq: Every day | ORAL | 2 refills | Status: DC

## 2016-06-16 MED ORDER — LISINOPRIL 5 MG PO TABS: 2.5000 mg | ORAL_TABLET | Freq: Every day | ORAL | 2 refills | Status: DC

## 2016-06-16 MED ORDER — GUAIFENESIN ER 600 MG PO TB12: 600.0000 mg | ORAL_TABLET | Freq: Two times a day (BID) | ORAL | 0 refills | Status: DC | PRN

## 2016-06-16 MED ORDER — FLUTICASONE PROPIONATE 50 MCG/ACT NA SUSP: 2.0000 | Freq: Every day | NASAL | 1 refills | Status: DC

## 2016-06-16 MED ORDER — PREDNISONE 20 MG PO TABS: 40.0000 mg | ORAL_TABLET | Freq: Every day | ORAL | 0 refills | Status: DC

## 2016-06-16 NOTE — Progress Notes (Signed)
Subjective:  Patient ID: [name], female    DOB: [birth year]  Age: 46 y.o. MRN: [id number]  CC: Sinus Problem (sinus pressure,congestion,ears pain. got better since last ov,but not gone)   Sinus Problem  This is a recurrent problem. The current episode started 1 to 4 weeks ago. The problem has been gradually improving since onset. There has been no fever. Associated symptoms include congestion, coughing, ear pain, headaches, sinus pressure and a sore throat. Pertinent negatives include no chills, diaphoresis, hoarse voice, neck pain, shortness of breath, sneezing or swollen glands. Past treatments include acetaminophen, oral decongestants, antibiotics and saline sprays. The treatment provided moderate relief.   Also needs xanax refilled.  HTN: She has noted persistent elevated BP despite use of HCTZ 12.5mg   and will like treatment.  Outpatient Medications Prior to Visit  Medication Sig Dispense Refill  . albuterol (PROVENTIL HFA;VENTOLIN HFA) 108 (90 Base) MCG/ACT inhaler Inhale 2 puffs into the lungs every 6 (six) hours as needed for wheezing or shortness of breath. 1 Inhaler 2  . AMBULATORY NON FORMULARY MEDICATION Medication Name: 1 manuel massage every 2 weeks 1 each 0  . aspirin-acetaminophen-caffeine (EXCEDRIN MIGRAINE) [id number] MG per tablet Take 2 tablets by mouth 2 (two) times daily as needed for headache or migraine.    . Cyanocobalamin (VITAMIN B-12 PO) Take 2 tablets by mouth daily.    . cyclobenzaprine (FLEXERIL) 5 MG tablet Take 1 tablet (5 mg total) by mouth 2 (two) times daily. 60 tablet 5  . estradiol (CLIMARA) 0.05 mg/24hr patch Place 1 patch (0.05 mg total) onto the skin once a week. 4 patch 12  . fluocinonide cream (LIDEX) [id number] % Apply 1 application topically 2 (two) times daily. 60 g 0  . gabapentin (NEURONTIN) 600 MG tablet Take 2 tablets (1,200 mg total) by mouth 3 (three) times daily. 360 tablet 11  . hydrochlorothiazide (MICROZIDE) 12.5 MG capsule Take 1  capsule (12.5 mg total) by mouth daily. 90 capsule 3  . levothyroxine (SYNTHROID, LEVOTHROID) 50 MCG tablet Take 1 tablet (50 mcg total) by mouth daily before breakfast. 90 tablet 3  . meloxicam (MOBIC) 15 MG tablet Take 1 tablet (15 mg total) by mouth daily as needed for pain. 90 tablet 3  . nortriptyline (PAMELOR) 10 MG capsule Take nortriptyline 10mg  at bedtime for 2 week, then increase to 2 tablet at bedtime 60 capsule 11  . pantoprazole (PROTONIX) 40 MG tablet Take 1 tablet twice a day for 2 weeks then 1 tablet daily. 45 tablet 0  . sodium chloride (OCEAN) 0.65 % SOLN nasal spray Place 1 spray into both nostrils daily as needed for congestion.    . SUMAtriptan (IMITREX) 100 MG tablet Take 1 tablet (100 mg total) by mouth every 2 (two) hours as needed for migraine. May repeat in 2 hours if headache persists or recurs. 10 tablet 11  . tetrahydrozoline-zinc (VISINE-AC) 0.05-0.25 % ophthalmic solution Place 2 drops into both eyes 3 (three) times daily as needed (dry eyes).    . venlafaxine XR (EFFEXOR XR) 150 MG 24 hr capsule Take 1 capsule (150 mg total) by mouth daily with breakfast. 90 capsule 3  . ALPRAZolam (XANAX) 1 MG tablet Take 1 tablet (1 mg total) by mouth 2 (two) times daily as needed for anxiety. 60 tablet 5  . traMADol (ULTRAM) 50 MG tablet Take 1 tablet (50 mg total) by mouth every 12 (twelve) hours as needed. (Patient not taking: Reported on 06/16/2016) 30 tablet 0  . diclofenac (VOLTAREN)  75 MG EC tablet Take 1 tablet (75 mg total) by mouth 2 (two) times daily. (Patient not taking: Reported on 06/16/2016) 30 tablet 1  . lidocaine-prilocaine (EMLA) cream Apply to port 1-2 hours before access with needle. Cover with plastic wrap. (Patient not taking: Reported on 06/16/2016) 30 g 1  . predniSONE (DELTASONE) 10 MG tablet 2 tabs by mouth per day for 5 days (Patient not taking: Reported on 06/16/2016) 10 tablet 0  . sucralfate (CARAFATE) 1 G tablet Take 1 tablet (1 g total) by mouth 4 (four) times  daily -  before meals and at bedtime. (Patient not taking: Reported on 06/16/2016) 120 tablet 2  . traMADol (ULTRAM) 50 MG tablet Take 1 tablet (50 mg total) by mouth every 12 (twelve) hours as needed. (Patient not taking: Reported on 06/16/2016) 20 tablet 0   No facility-administered medications prior to visit.     ROS See HPI  Objective:  BP (!) 140/94   Pulse 93   Temp 98.5 F (36.9 C)   Ht 5\' 8"  (1.727 m)   Wt (!) 305 lb (138.3 kg)   LMP 11/02/2014   SpO2 98%   BMI 46.38 kg/m   BP Readings from Last 3 Encounters:  06/16/16 (!) 140/94  05/12/16 (!) 130/91  05/05/16 (!) 150/90    Wt Readings from Last 3 Encounters:  06/16/16 (!) 305 lb (138.3 kg)  05/05/16 (!) 313 lb (142 kg)  04/21/16 (!) 307 lb 1.6 oz (139.3 kg)    Physical Exam  Constitutional: She is oriented to person, place, and time.  HENT:  Right Ear: Tympanic membrane, external ear and ear canal normal.  Left Ear: Tympanic membrane, external ear and ear canal normal.  Nose: Mucosal edema and rhinorrhea present. Right sinus exhibits maxillary sinus tenderness. Right sinus exhibits no frontal sinus tenderness. Left sinus exhibits maxillary sinus tenderness. Left sinus exhibits no frontal sinus tenderness.  Mouth/Throat: Uvula is midline. No trismus in the jaw. Posterior oropharyngeal erythema present. No oropharyngeal exudate.  Eyes: No scleral icterus.  Neck: Normal range of motion. Neck supple.  Cardiovascular: Normal rate and normal heart sounds.   Pulmonary/Chest: Effort normal and breath sounds normal.  Musculoskeletal: She exhibits no edema.  Lymphadenopathy:    She has no cervical adenopathy.  Neurological: She is alert and oriented to person, place, and time.  Vitals reviewed.   Lab Results  Component Value Date   WBC 11.7 (H) 05/12/2016   HGB 13.5 05/12/2016   HCT 38.9 05/12/2016   PLT 277 05/12/2016   GLUCOSE 115 04/21/2016   ALT 25 04/21/2016   AST 19 04/21/2016   NA 139 04/21/2016   K 3.4  (L) 04/21/2016   CL 103 09/04/2015   CREATININE 0.8 04/21/2016   BUN 10.6 04/21/2016   CO2 25 04/21/2016   TSH 1.877 09/04/2015   INR 0.90 05/12/2016    Mr Ankle Left W Wo Contrast  Result Date: 05/29/2016 CLINICAL DATA:  Left ankle pain x1 year with extending views. Ankle phases of the times. Soft tissue mass along the lateral aspect of the ankle marked by patient. History of ovarian cancer 2016 with chemotherapy. EXAM: MRI OF THE LEFT ANKLE WITHOUT AND WITH CONTRAST TECHNIQUE: Multiplanar, multisequence MR imaging of the ankle was performed before and after the administration of intravenous contrast. CONTRAST:  9mL MULTIHANCE GADOBENATE DIMEGLUMINE 529 MG/ML IV SOLN COMPARISON:  Left ankle radiographs from 10/02/2015 FINDINGS: TENDONS Peroneal: Intact peroneus longus and peroneus brevis tendons. Posteromedial: Intact tibialis posterior, flexor  hallucis longus and flexor digitorum longus tendons. Anterior: Intact tibialis anterior, extensor hallucis longus and extensor digitorum longus tendons. Achilles: Intact Plantar Fascia: Intact LIGAMENTS Lateral: Intact. Medial: Intact. CARTILAGE Ankle Joint: No joint effusion. Minimal partial thickness thinning of the cartilage. Bones: No acute fracture bone destruction. Minimal degenerative subchondral cystic change along the lateral talar dome across the ankle joint, posterior facet of subtalar joint and about the midfoot articulations. Tiny plantar calcaneal enthesophyte. Other: Mild-to-moderate diffuse subcutaneous edema along the anterior, medial and lateral aspect of the distal leg and ankle. No focal soft tissue mass identified. The area in question between the two lateral markers placed by the patient appear to be the related to subcutaneous fat. IMPRESSION: IMPRESSION 1. Soft tissue edema about the distal left leg. 2. No acute osseous abnormality. Minor degenerative subchondral cystic change about the ankle and subtalar joints as well as midfoot  articulations with minimal clearing of the ankle joint cartilage. 3. The tendons and ligaments crossing the ankle joint appear intact. Electronically Signed   By: Ashley Royalty M.D.   On: 05/29/2016 17:36    Assessment & Plan:   Sukhmani was seen today for sinus problem.  Diagnoses and all orders for this visit:  Chronic allergic rhinitis, unspecified seasonality, unspecified trigger -     predniSONE (DELTASONE) 20 MG tablet; Take 2 tablets (40 mg total) by mouth daily with breakfast. -     fluticasone (FLONASE) 50 MCG/ACT nasal spray; Place 2 sprays into both nostrils daily. -     montelukast (SINGULAIR) 10 MG tablet; Take 1 tablet (10 mg total) by mouth at bedtime.  HTN (hypertension), benign -     Discontinue: lisinopril (PRINIVIL,ZESTRIL) 5 MG tablet; Take 1 tablet (5 mg total) by mouth daily. -     lisinopril (PRINIVIL,ZESTRIL) 5 MG tablet; Take 0.5 tablets (2.5 mg total) by mouth daily.  Nasal sinus congestion -     predniSONE (DELTASONE) 20 MG tablet; Take 2 tablets (40 mg total) by mouth daily with breakfast. -     fluticasone (FLONASE) 50 MCG/ACT nasal spray; Place 2 sprays into both nostrils daily. -     guaiFENesin (MUCINEX) 600 MG 12 hr tablet; Take 1 tablet (600 mg total) by mouth 2 (two) times daily as needed for cough or to loosen phlegm. -     montelukast (SINGULAIR) 10 MG tablet; Take 1 tablet (10 mg total) by mouth at bedtime.  Anxiety -     ALPRAZolam (XANAX) 1 MG tablet; Take 1 tablet (1 mg total) by mouth 2 (two) times daily as needed for anxiety.   I have discontinued [name]'s sucralfate, lidocaine-prilocaine, diclofenac, and predniSONE. I have also changed her lisinopril. Additionally, I am having her start on predniSONE, fluticasone, guaiFENesin, and montelukast. Lastly, I am having her maintain her Cyanocobalamin (VITAMIN B-12 PO), aspirin-acetaminophen-caffeine, pantoprazole, AMBULATORY NON FORMULARY MEDICATION, sodium chloride, tetrahydrozoline-zinc,  hydrochlorothiazide, levothyroxine, cyclobenzaprine, venlafaxine XR, fluocinonide cream, SUMAtriptan, albuterol, estradiol, gabapentin, nortriptyline, traMADol, meloxicam, clobetasol, and ALPRAZolam.  Meds ordered this encounter  Medications  . clobetasol (OLUX) 0.05 % topical foam  . DISCONTD: lisinopril (PRINIVIL,ZESTRIL) 5 MG tablet    Sig: Take 1 tablet (5 mg total) by mouth daily.    Dispense:  30 tablet    Refill:  2    Order Specific Question:   Supervising Provider    Answer:   Cassandria Anger [[id number]]  . predniSONE (DELTASONE) 20 MG tablet    Sig: Take 2 tablets (40 mg total) by mouth daily  with breakfast.    Dispense:  14 tablet    Refill:  0    Order Specific Question:   Supervising Provider    Answer:   Cassandria Anger [[id number]]  . fluticasone (FLONASE) 50 MCG/ACT nasal spray    Sig: Place 2 sprays into both nostrils daily.    Dispense:  16 g    Refill:  1    Order Specific Question:   Supervising Provider    Answer:   Cassandria Anger [[id number]]  . guaiFENesin (MUCINEX) 600 MG 12 hr tablet    Sig: Take 1 tablet (600 mg total) by mouth 2 (two) times daily as needed for cough or to loosen phlegm.    Dispense:  14 tablet    Refill:  0    Order Specific Question:   Supervising Provider    Answer:   Cassandria Anger [[id number]]  . ALPRAZolam (XANAX) 1 MG tablet    Sig: Take 1 tablet (1 mg total) by mouth 2 (two) times daily as needed for anxiety.    Dispense:  60 tablet    Refill:  0    Order Specific Question:   Supervising Provider    Answer:   Cassandria Anger [[id number]]  . montelukast (SINGULAIR) 10 MG tablet    Sig: Take 1 tablet (10 mg total) by mouth at bedtime.    Dispense:  30 tablet    Refill:  3    Order Specific Question:   Supervising Provider    Answer:   Cassandria Anger [[id number]]  . lisinopril (PRINIVIL,ZESTRIL) 5 MG tablet    Sig: Take 0.5 tablets (2.5 mg total) by mouth daily.    Dispense:  30 tablet    Refill:  2    Dose change    Order  Specific Question:   Supervising Provider    Answer:   Cassandria Anger [[id number]]    Follow-up: Return in about 4 weeks (around 07/14/2016) for HTN and anxiey with Dr. [name].Wilfred Lacy, NP

## 2016-06-16 NOTE — Patient Instructions (Addendum)
Start flonase after completion of oral prednisone.  Get additional Xanax refill from Dr. [name].   Hypertension Hypertension is another name for high blood pressure. High blood pressure forces your heart to work harder to pump blood. A blood pressure reading has two numbers, which includes a higher number over a lower number (example: 110/72). Follow these instructions at home:  Have your blood pressure rechecked by your doctor.  Only take medicine as told by your doctor. Follow the directions carefully. The medicine does not work as well if you skip doses. Skipping doses also puts you at risk for problems.  Do not smoke.  Monitor your blood pressure at home as told by your doctor. Contact a doctor if:  You think you are having a reaction to the medicine you are taking.  You have repeat headaches or feel dizzy.  You have puffiness (swelling) in your ankles.  You have trouble with your vision. Get help right away if:  You get a very bad headache and are confused.  You feel weak, numb, or faint.  You get chest or belly (abdominal) pain.  You throw up (vomit).  You cannot breathe very well. This information is not intended to replace advice given to you by your health care provider. Make sure you discuss any questions you have with your health care provider. Document Released: 10/12/2007 Document Revised: 10/01/2015 Document Reviewed: 02/15/2013 Elsevier Interactive Patient Education  2017 [employer]  Sinusitis, Adult Sinusitis is soreness and inflammation of your sinuses. Sinuses are hollow spaces in the bones around your face. They are located:  Around your eyes.  In the middle of your forehead.  Behind your nose.  In your cheekbones. Your sinuses and nasal passages are lined with a stringy fluid (mucus). Mucus normally drains out of your sinuses. When your nasal tissues get inflamed or swollen, the mucus can get trapped or blocked so air cannot flow through your  sinuses. This lets bacteria, viruses, and funguses grow, and that leads to infection. Follow these instructions at home: Medicines  Take, use, or apply over-the-counter and prescription medicines only as told by your doctor. These may include nasal sprays.  If you were prescribed an antibiotic medicine, take it as told by your doctor. Do not stop taking the antibiotic even if you start to feel better. Hydrate and Humidify  Drink enough water to keep your pee (urine) clear or pale yellow.  Use a cool mist humidifier to keep the humidity level in your home above 50%.  Breathe in steam for 10-15 minutes, 3-4 times a day or as told by your doctor. You can do this in the bathroom while a hot shower is running.  Try not to spend time in cool or dry air. Rest  Rest as much as possible.  Sleep with your head raised (elevated).  Make sure to get enough sleep each night. General instructions  Put a warm, moist washcloth on your face 3-4 times a day or as told by your doctor. This will help with discomfort.  Wash your hands often with soap and water. If there is no soap and water, use hand sanitizer.  Do not smoke. Avoid being around people who are smoking (secondhand smoke).  Keep all follow-up visits as told by your doctor. This is important. Contact a doctor if:  You have a fever.  Your symptoms get worse.  Your symptoms do not get better within 10 days. Get help right away if:  You have a very bad  headache.  You cannot stop throwing up (vomiting).  You have pain or swelling around your face or eyes.  You have trouble seeing.  You feel confused.  Your neck is stiff.  You have trouble breathing. This information is not intended to replace advice given to you by your health care provider. Make sure you discuss any questions you have with your health care provider. Document Released: 10/12/2007 Document Revised: 12/20/2015 Document Reviewed: 02/18/2015 Elsevier  Interactive Patient Education  2017 [employer].

## 2016-06-16 NOTE — Progress Notes (Signed)
Pre visit review using our clinic review tool, if applicable. No additional management support is needed unless otherwise documented below in the visit note. 

## 2016-06-22 ENCOUNTER — Encounter: Payer: Self-pay | Admitting: Acute Care

## 2016-06-22 ENCOUNTER — Ambulatory Visit (INDEPENDENT_AMBULATORY_CARE_PROVIDER_SITE_OTHER): Payer: BLUE CROSS/BLUE SHIELD | Admitting: Acute Care

## 2016-06-22 VITALS — BP 122/86 | HR 92 | Ht 68.0 in | Wt 303.6 lb

## 2016-06-22 DIAGNOSIS — Z9989 Dependence on other enabling machines and devices: Secondary | ICD-10-CM | POA: Diagnosis not present

## 2016-06-22 DIAGNOSIS — G4733 Obstructive sleep apnea (adult) (pediatric): Secondary | ICD-10-CM

## 2016-06-22 NOTE — Assessment & Plan Note (Addendum)
Moderate Sleep Apnea Doing well on CPAP Device Tolerating nasal pillows well Benefiting from treatment with AHI decrease to 1.9 Plan: You are doing great with your CPAP. You are benefiting from therapy. We will order new CPAP supplies. We will send you for fitting of your nasal pillows. Continue on CPAP at bedtime. You appear to be benefiting from the treatment Goal is to wear for at least 6 hours each night for maximal clinical benefit. Continue to work on weight loss, as the link between excess weight  and sleep apnea is well established.  Do not drive if sleepy. Remember to clean machine weekly with soap and water. Work on good sleep hygeine, goal of 7-8 hours of sleep each night. Follow up with Dr. Murlean Iba  In  6 months or before as needed.  Please contact office for sooner follow up if symptoms do not improve or worsen or seek emergency care

## 2016-06-22 NOTE — Progress Notes (Signed)
History of Present Illness [name] is a 46 y.o. female with moderate  OSA per home sleep study 09/2015( AHI=21). She has been on CPAP therapy since 09/2015 followed by Dr. Corrie Dandy. 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   06/22/2016 Follow Up CPAP: Pt. Presents for CPAP check. She is doing well. She wears her device 27 of 30 days, 90% of the time.She states she feels better since using her device.She has great improvement in her daytime sleepiness. She has more energy.She does have a hard time getting to sleep at night  due to her surgical menopause.She is compliant with her CPAP. She does have complaints of her mask not fitting well. She is working on weight loss. She was recently layed off at [employer], which  has been a stressor.She denies chest pain, fever, orthopnea, or hemoptysis.  Previous Instructions from Dr. Corrie Dandy: Continue with autocpap 5-15 cm water. She is claustrophobic and is comfortable with nasal pillows. Mentioned about Amara mask or For her mask or the Wisp as alternatives.    Patient was instructed to have mask, tubings, filter, reservoir cleaned at least once a week with soapy water.  Patient was instructed to call the office if he/she is having issues with the PAP device.    I advised patient to obtain sufficient amount of sleep --  7 to 8 hours at least in a 24 hr period.  Patient was advised to follow good sleep hygiene.  Patient was advised NOT to engage in activities requiring concentration and/or vigilance if he/she is and  sleepy.  Patient is NOT to drive if he/she is sleepy.   Tests Sleep Test; 09/21/2015 AHI= 21  Down Load: 05/23/2016-06/21/2016  Down Load Auto Set 5- 15 cm H2O Usage Days : 27/30 ( 90%) > 4 hours 26 days( 87 %) < 4 hours 1 days ( 3%) Average Usage 6 hours and 32 minutes. AHI= 1.9  Past medical hx Past Medical History:  Diagnosis Date  . Anxiety   . Elevated  blood pressure, situational 05/05/2016  . Endometriosis   . Exercise-induced asthma 09/08/2015  . Ovarian cancer, bilateral ([place]) 11/28/2014  . PONV (postoperative nausea and vomiting)   . Thyroid disease      Past surgical hx, Family hx, Social hx all reviewed.  Current Outpatient Prescriptions on File Prior to Visit  Medication Sig  . albuterol (PROVENTIL HFA;VENTOLIN HFA) 108 (90 Base) MCG/ACT inhaler Inhale 2 puffs into the lungs every 6 (six) hours as needed for wheezing or shortness of breath.  . ALPRAZolam (XANAX) 1 MG tablet Take 1 tablet (1 mg total) by mouth 2 (two) times daily as needed for anxiety.  . AMBULATORY NON FORMULARY MEDICATION Medication Name: 1 manuel massage every 2 weeks  . aspirin-acetaminophen-caffeine (EXCEDRIN MIGRAINE) 250-250-65 MG per tablet Take 2 tablets by mouth 2 (two) times daily as needed for headache or migraine.  . clobetasol (OLUX) 0.05 % topical foam   . Cyanocobalamin (VITAMIN B-12 PO) Take 2 tablets by mouth daily.  . cyclobenzaprine (FLEXERIL) 5 MG tablet Take 1 tablet (5 mg total) by mouth 2 (two) times daily.  Marland Kitchen estradiol (CLIMARA) 0.05 mg/24hr patch Place 1 patch (0.05 mg total) onto the skin once a week.  . fluocinonide cream (LIDEX) [id number] % Apply 1 application topically 2 (two) times daily.  Derrill Memo ON 06/24/2016] fluticasone (FLONASE) 50 MCG/ACT nasal spray Place 2 sprays into both  nostrils daily.  Marland Kitchen gabapentin (NEURONTIN) 600 MG tablet Take 2 tablets (1,200 mg total) by mouth 3 (three) times daily.  Marland Kitchen guaiFENesin (MUCINEX) 600 MG 12 hr tablet Take 1 tablet (600 mg total) by mouth 2 (two) times daily as needed for cough or to loosen phlegm.  . hydrochlorothiazide (MICROZIDE) 12.5 MG capsule Take 1 capsule (12.5 mg total) by mouth daily.  Marland Kitchen levothyroxine (SYNTHROID, LEVOTHROID) 50 MCG tablet Take 1 tablet (50 mcg total) by mouth daily before breakfast.  . lisinopril (PRINIVIL,ZESTRIL) 5 MG tablet Take 0.5 tablets (2.5 mg total) by mouth daily.    . meloxicam (MOBIC) 15 MG tablet Take 1 tablet (15 mg total) by mouth daily as needed for pain.  . montelukast (SINGULAIR) 10 MG tablet Take 1 tablet (10 mg total) by mouth at bedtime.  . nortriptyline (PAMELOR) 10 MG capsule Take nortriptyline 10mg  at bedtime for 2 week, then increase to 2 tablet at bedtime  . pantoprazole (PROTONIX) 40 MG tablet Take 1 tablet twice a day for 2 weeks then 1 tablet daily.  . predniSONE (DELTASONE) 20 MG tablet Take 2 tablets (40 mg total) by mouth daily with breakfast.  . sodium chloride (OCEAN) 0.65 % SOLN nasal spray Place 1 spray into both nostrils daily as needed for congestion.  . SUMAtriptan (IMITREX) 100 MG tablet Take 1 tablet (100 mg total) by mouth every 2 (two) hours as needed for migraine. May repeat in 2 hours if headache persists or recurs.  Marland Kitchen tetrahydrozoline-zinc (VISINE-AC) 0.05-0.25 % ophthalmic solution Place 2 drops into both eyes 3 (three) times daily as needed (dry eyes).  . traMADol (ULTRAM) 50 MG tablet Take 1 tablet (50 mg total) by mouth every 12 (twelve) hours as needed.  . venlafaxine XR (EFFEXOR XR) 150 MG 24 hr capsule Take 1 capsule (150 mg total) by mouth daily with breakfast.   No current facility-administered medications on file prior to visit.      Allergies  Allergen Reactions  . Moxifloxacin Anaphylaxis    needs epinephrine shot  . Quinolones Anaphylaxis, Shortness Of Breath and Swelling  . Doxycycline Nausea And Vomiting  . Escitalopram Oxalate Other (See Comments)     fatigue  . Sulfonamide Derivatives Swelling and Rash    needs epinephrine shot--rash and lip swelling    Review Of Systems:  Constitutional:   +  weight loss ( 7 lbs) , +night sweats ( menopause),  No Fevers, chills, fatigue, or  lassitude.  HEENT:   No headaches,  Difficulty swallowing,  Tooth/dental problems, or  Sore throat,                No sneezing, itching, ear ache, +nasal congestion, post nasal drip,   CV:  No chest pain,  Orthopnea,  PND, swelling in lower extremities, anasarca, dizziness, palpitations, syncope.   GI  No heartburn, indigestion, abdominal pain, nausea, vomiting, diarrhea, change in bowel habits, loss of appetite, bloody stools.   Resp: No shortness of breath with exertion or at rest.  No excess mucus, no productive cough,  No non-productive cough,  No coughing up of blood.  No change in color of mucus.  No wheezing.  No chest wall deformity  Skin: no rash or lesions, tattoos.  GU: no dysuria, change in color of urine, no urgency or frequency.  No flank pain, no hematuria   MS:  No joint pain or swelling.  No decreased range of motion.  No back pain.  Psych:  No change in mood or  affect. No depression or anxiety.  No memory loss.   Vital Signs BP 122/86 (BP Location: Left Arm, Cuff Size: Normal)   Pulse 92   Ht 5\' 8"  (1.727 m)   Wt (!) 303 lb 9.6 oz (137.7 kg)   LMP 11/02/2014   SpO2 97%   BMI 46.16 kg/m    Physical Exam:  General- No distress,  A&Ox3, pleasant ENT: No sinus tenderness, TM clear, pale nasal mucosa, no oral exudate,no post nasal drip, no LAN Cardiac: S1, S2, regular rate and rhythm, no murmur Chest: No wheeze/ rales/ dullness; no accessory muscle use, no nasal flaring, no sternal retractions Abd.: Soft Non-tender, obese Ext: No clubbing cyanosis, edema Neuro:  normal strength Skin: No rashes, warm and dry Psych: normal mood and behavior   Assessment/Plan  OSA on CPAP Moderate Sleep Apnea Doing well on CPAP Device Tolerating nasal pillows well Benefiting from treatment with AHI decrease to 1.9 Plan: You are doing great with your CPAP. You are benefiting from therapy. We will order new CPAP supplies. We will send you for fitting of your nasal pillows. Continue on CPAP at bedtime. You appear to be benefiting from the treatment Goal is to wear for at least 6 hours each night for maximal clinical benefit. Continue to work on weight loss, as the link between excess  weight  and sleep apnea is well established.  Do not drive if sleepy. Remember to clean machine weekly with soap and water. Work on good sleep hygeine, goal of 7-8 hours of sleep each night. Follow up with Dr. Murlean Iba  In  6 months or before as needed.  Please contact office for sooner follow up if symptoms do not improve or worsen or seek emergency care      Magdalen Spatz, NP 06/22/2016  7:31 PM

## 2016-06-22 NOTE — Patient Instructions (Addendum)
It is good to see you today. You are doing great with your CPAP. You are benefiting from therapy. We will order new CPAP supplies. We will send you for fitting of your nasal pillows. Continue on CPAP at bedtime. You appear to be benefiting from the treatment Goal is to wear for at least 6 hours each night for maximal clinical benefit. Continue to work on weight loss, as the link between excess weight  and sleep apnea is well established.  Do not drive if sleepy. Follow up with Dr. Murlean Iba  In  6 months or before as needed.  Please contact office for sooner follow up if symptoms do not improve or worsen or seek emergency care

## 2016-06-27 ENCOUNTER — Ambulatory Visit (INDEPENDENT_AMBULATORY_CARE_PROVIDER_SITE_OTHER): Payer: BLUE CROSS/BLUE SHIELD | Admitting: Licensed Clinical Social Worker

## 2016-06-27 DIAGNOSIS — F331 Major depressive disorder, recurrent, moderate: Secondary | ICD-10-CM | POA: Diagnosis not present

## 2016-06-29 ENCOUNTER — Encounter: Payer: Self-pay | Admitting: Podiatry

## 2016-06-29 DIAGNOSIS — D179 Benign lipomatous neoplasm, unspecified: Secondary | ICD-10-CM | POA: Diagnosis not present

## 2016-06-30 ENCOUNTER — Telehealth: Payer: Self-pay | Admitting: *Deleted

## 2016-06-30 ENCOUNTER — Telehealth: Payer: Self-pay | Admitting: Neurology

## 2016-06-30 MED ORDER — GABAPENTIN 600 MG PO TABS: 1200.0000 mg | ORAL_TABLET | Freq: Three times a day (TID) | ORAL | 3 refills | Status: DC

## 2016-06-30 NOTE — Telephone Encounter (Signed)
PT called and wanted a 90 day supply of gabapentin called in because she got laid off and will be losing her insurance/Dawn CB# [phone]

## 2016-06-30 NOTE — Telephone Encounter (Signed)
Ok to call in 90 day supply?

## 2016-06-30 NOTE — Telephone Encounter (Signed)
Pt asked if she had to be in the boot at all times. Left message informing pt she should be in the boot at all times, except when she was resting and knew she would not go to sleep or have to get up and walk around.

## 2016-06-30 NOTE — Telephone Encounter (Signed)
Called and left a message for the patient stating that I was checking to see how she was doing after surgery yesterday and to call and let me know. [name]

## 2016-06-30 NOTE — Telephone Encounter (Signed)
Done, prescription sent to [facility] on file.

## 2016-06-30 NOTE — Telephone Encounter (Signed)
Called patient at 1:38 pm today and left a message and I tried to call patient again at 4:00 pm and patient answered and I asked how the patient was doing since her surgery and patient stated she was doing good and asked if she needed to wear the boot at night and stated to wear it at night but could remove only when laying down or in recliner and elevate and ice and patient was taken the pain medicines like she is suppose to and I stated to call the office with any concerns or questions. [name]

## 2016-07-01 NOTE — Telephone Encounter (Signed)
POV call:  Patient states she is doing well after surgery. Pain has been controlled with pain meds and/or ice. Says she was up on it a lot yesterday so did have some increased pain and swelling but once she got off of it, it was better. Taking pain meds regularly. Denies shortness of breath, chest pain and calf pain. Confirmed next week's appt.

## 2016-07-01 NOTE — Progress Notes (Signed)
[facility] Counseling Note  Called patient per Lauren Mullis/LCSW to offer individual counseling services. Pt set up intake appointment for 3/1 at 2p.  [name], [place] [facility] [phone]

## 2016-07-04 ENCOUNTER — Ambulatory Visit (INDEPENDENT_AMBULATORY_CARE_PROVIDER_SITE_OTHER): Payer: Self-pay | Admitting: Podiatry

## 2016-07-04 ENCOUNTER — Ambulatory Visit (INDEPENDENT_AMBULATORY_CARE_PROVIDER_SITE_OTHER): Payer: BLUE CROSS/BLUE SHIELD

## 2016-07-04 ENCOUNTER — Encounter: Payer: Self-pay | Admitting: Podiatry

## 2016-07-04 DIAGNOSIS — Z9889 Other specified postprocedural states: Secondary | ICD-10-CM | POA: Diagnosis not present

## 2016-07-04 MED ORDER — OXYCODONE-ACETAMINOPHEN 5-325 MG PO TABS: 1.0000 | ORAL_TABLET | Freq: Three times a day (TID) | ORAL | 0 refills | Status: DC | PRN

## 2016-07-05 ENCOUNTER — Telehealth: Payer: Self-pay | Admitting: Neurology

## 2016-07-05 ENCOUNTER — Ambulatory Visit (INDEPENDENT_AMBULATORY_CARE_PROVIDER_SITE_OTHER): Payer: BLUE CROSS/BLUE SHIELD | Admitting: Licensed Clinical Social Worker

## 2016-07-05 DIAGNOSIS — F3341 Major depressive disorder, recurrent, in partial remission: Secondary | ICD-10-CM | POA: Diagnosis not present

## 2016-07-05 NOTE — Telephone Encounter (Signed)
error 

## 2016-07-06 ENCOUNTER — Encounter: Payer: Self-pay | Admitting: Internal Medicine

## 2016-07-06 ENCOUNTER — Ambulatory Visit (INDEPENDENT_AMBULATORY_CARE_PROVIDER_SITE_OTHER): Payer: BLUE CROSS/BLUE SHIELD | Admitting: Internal Medicine

## 2016-07-06 VITALS — BP 126/86 | HR 78 | Temp 97.6°F | Wt 304.0 lb

## 2016-07-06 DIAGNOSIS — K219 Gastro-esophageal reflux disease without esophagitis: Secondary | ICD-10-CM | POA: Diagnosis not present

## 2016-07-06 DIAGNOSIS — J069 Acute upper respiratory infection, unspecified: Secondary | ICD-10-CM | POA: Diagnosis not present

## 2016-07-06 DIAGNOSIS — F419 Anxiety disorder, unspecified: Secondary | ICD-10-CM

## 2016-07-06 MED ORDER — ALPRAZOLAM 1 MG PO TABS: 1.0000 mg | ORAL_TABLET | Freq: Two times a day (BID) | ORAL | 5 refills | Status: DC | PRN

## 2016-07-06 MED ORDER — PANTOPRAZOLE SODIUM 40 MG PO TBEC: DELAYED_RELEASE_TABLET | ORAL | 3 refills | Status: DC

## 2016-07-06 MED ORDER — CYCLOBENZAPRINE HCL 5 MG PO TABS: 5.0000 mg | ORAL_TABLET | Freq: Two times a day (BID) | ORAL | 5 refills | Status: DC

## 2016-07-06 MED ORDER — HYDROCHLOROTHIAZIDE 12.5 MG PO CAPS: 12.5000 mg | ORAL_CAPSULE | Freq: Every day | ORAL | 3 refills | Status: DC

## 2016-07-06 MED ORDER — CLOBETASOL PROPIONATE 0.05 % EX FOAM: Freq: Two times a day (BID) | CUTANEOUS | 1 refills | Status: DC

## 2016-07-06 MED ORDER — VENLAFAXINE HCL ER 150 MG PO CP24: 150.0000 mg | ORAL_CAPSULE | Freq: Every day | ORAL | 3 refills | Status: DC

## 2016-07-06 MED ORDER — LEVOTHYROXINE SODIUM 50 MCG PO TABS: 50.0000 ug | ORAL_TABLET | Freq: Every day | ORAL | 3 refills | Status: DC

## 2016-07-06 NOTE — Assessment & Plan Note (Signed)
midl worsening, for restart protonix 40 qd

## 2016-07-06 NOTE — Progress Notes (Signed)
DOS 02.21.2018 Left foot/ankle removal of fatty tissue from outside of foot/ankle. Subtalar joint steroid injection.

## 2016-07-06 NOTE — Assessment & Plan Note (Signed)
Recent, mild now symtpomatically improved, likely viral, for mucinex otc prn

## 2016-07-06 NOTE — Patient Instructions (Signed)
Please continue all other medications as before, and refills have been done if requested.  Please have the pharmacy call with any other refills you may need.  Please keep your appointments with your specialists as you may have planned  Please return in 1 year, or as needed

## 2016-07-06 NOTE — Assessment & Plan Note (Signed)
Stable, for xanax refill and to continue counseling

## 2016-07-06 NOTE — Progress Notes (Signed)
Subjective:    Patient ID: [name], female    DOB: 1971/02/25, 46 y.o.   MRN: [id number]  HPI  Here to f/u,  Has had 3 days acute onset fever, facial pain, pressure, headache, general weakness and malaise, and clearish prod cough with fortunately symptoms overall much improved today, wdid have mild ST now resolved, but pt denies chest pain, wheezing, increased sob or doe, orthopnea, PND, increased LE swelling, palpitations, dizziness or syncope. Had left ankle surgury per Dr Sula Rumple; laid off of job at [employer] with severence.  Last day for health insurance is today, needs all 90 day refills.  Not planning to get cobra, and will look for new job.  Denies worsening depressive symptoms, suicidal ideation, or panic; has ongoing anxiety.  Denies worsening reflux when taking the protonix but then ran out with increased sour brash in the past week, without abd pain, dysphagia, n/v, bowel change or blood.  Past Medical History:  Diagnosis Date  . Anxiety   . Elevated blood pressure, situational 05/05/2016  . Endometriosis   . Exercise-induced asthma 09/08/2015  . Ovarian cancer, bilateral ([place]) 11/28/2014  . PONV (postoperative nausea and vomiting)   . Thyroid disease    Past Surgical History:  Procedure Laterality Date  . ABDOMINAL HYSTERECTOMY  11/10/14   at [facility], Exp lap, supracervical hyst, BSO, infracolic omentectomy, lymphadenectomy, aortic lymph node sampling  . IR GENERIC HISTORICAL  05/12/2016   IR REMOVAL TUN ACCESS W/ PORT W/O FL MOD SED 05/12/2016 [facility]-INTERV RAD    reports that she has never smoked. She has never used smokeless tobacco. She reports that she drinks alcohol. She reports that she does not use drugs. family history includes Atrial fibrillation in her father; Dementia in her paternal grandfather and paternal grandmother; Diabetes in her maternal aunt and mother; Healthy in her brother; Heart attack in her maternal grandfather; Heart failure in her maternal  grandmother; Hypertension in her mother; Stroke in her mother. Allergies  Allergen Reactions  . Moxifloxacin Anaphylaxis    needs epinephrine shot  . Quinolones Anaphylaxis, Shortness Of Breath and Swelling  . Doxycycline Nausea And Vomiting  . Escitalopram Oxalate Other (See Comments)     fatigue  . Sulfonamide Derivatives Swelling and Rash    needs epinephrine shot--rash and lip swelling   Current Outpatient Prescriptions on File Prior to Visit  Medication Sig Dispense Refill  . AMBULATORY NON FORMULARY MEDICATION Medication Name: 1 manuel massage every 2 weeks 1 each 0  . Cyanocobalamin (VITAMIN B-12 PO) Take 2 tablets by mouth daily.    Marland Kitchen estradiol (CLIMARA) 0.05 mg/24hr patch Place 1 patch (0.05 mg total) onto the skin once a week. 4 patch 12  . fluticasone (FLONASE) 50 MCG/ACT nasal spray Place 2 sprays into both nostrils daily. 16 g 1  . gabapentin (NEURONTIN) 600 MG tablet Take 2 tablets (1,200 mg total) by mouth 3 (three) times daily. 540 tablet 3  . guaiFENesin (MUCINEX) 600 MG 12 hr tablet Take 1 tablet (600 mg total) by mouth 2 (two) times daily as needed for cough or to loosen phlegm. 14 tablet 0  . lisinopril (PRINIVIL,ZESTRIL) 5 MG tablet Take 0.5 tablets (2.5 mg total) by mouth daily. 30 tablet 2  . meloxicam (MOBIC) 15 MG tablet Take 1 tablet (15 mg total) by mouth daily as needed for pain. 90 tablet 3  . montelukast (SINGULAIR) 10 MG tablet Take 1 tablet (10 mg total) by mouth at bedtime. 30 tablet 3  . oxyCODONE-acetaminophen (  ROXICET) 5-325 MG tablet Take 1 tablet by mouth every 8 (eight) hours as needed for severe pain. 30 tablet 0  . sodium chloride (OCEAN) 0.65 % SOLN nasal spray Place 1 spray into both nostrils daily as needed for congestion.    . SUMAtriptan (IMITREX) 100 MG tablet Take 1 tablet (100 mg total) by mouth every 2 (two) hours as needed for migraine. May repeat in 2 hours if headache persists or recurs. 10 tablet 11  . tetrahydrozoline-zinc (VISINE-AC)  0.05-0.25 % ophthalmic solution Place 2 drops into both eyes 3 (three) times daily as needed (dry eyes).    . traMADol (ULTRAM) 50 MG tablet Take 1 tablet (50 mg total) by mouth every 12 (twelve) hours as needed. 30 tablet 0  . cephALEXin (KEFLEX) 500 MG capsule Take 500 mg by mouth 3 (three) times daily.    Marland Kitchen oxyCODONE-acetaminophen (PERCOCET/ROXICET) 5-325 MG tablet Take 1-2 tablets by mouth every 4 (four) hours as needed for severe pain.    . promethazine (PHENERGAN) 25 MG tablet Take 25 mg by mouth every 8 (eight) hours as needed for nausea or vomiting.     No current facility-administered medications on file prior to visit.    Review of Systems  Constitutional: Negative for unusual diaphoresis or night sweats HENT: Negative for ear swelling or discharge Eyes: Negative for worsening visual haziness  Respiratory: Negative for choking and stridor.   Gastrointestinal: Negative for distension or worsening eructation Genitourinary: Negative for retention or change in urine volume.  Musculoskeletal: Negative for other MSK pain or swelling Skin: Negative for color change and worsening wound Neurological: Negative for tremors and numbness other than noted  Psychiatric/Behavioral: Negative for decreased concentration or agitation other than above   All other system neg per pt    Objective:   Physical Exam BP 126/86   Pulse 78   Temp 97.6 F (36.4 C)   Wt (!) 304 lb (137.9 kg)   LMP 11/02/2014   SpO2 98%   BMI 46.22 kg/m  VS noted, morbid obese, not ill appearing Constitutional: Pt appears in no apparent distress HENT: Head: NCAT.  Right Ear: External ear normal.  Left Ear: External ear normal.  Bilat tm's with mild erythema.  Max sinus areas none tender.  Pharynx with mild erythema, no exudate Eyes: . Pupils are equal, round, and reactive to light. Conjunctivae and EOM are normal Neck: Normal range of motion. Neck supple.  Cardiovascular: Normal rate and regular rhythm.     Pulmonary/Chest: Effort normal and breath sounds without rales or wheezing.  Abd:  Soft, NT, ND, + BS Neurological: Pt is alert. Not confused , motor grossly intact Skin: Skin is warm. No rash, no LE edema Psychiatric: Pt behavior is normal. No agitation. 1-2+ nervous No other exam findings   Lab Results  Component Value Date   TSH 1.877 09/04/2015       Assessment & Plan:

## 2016-07-07 NOTE — Progress Notes (Signed)
[facility] Counseling Note:  First individual session with pt. Completed intake paperwork. Pt reported no SI, history of substance abuse, or substantive mental health history.   Presenting concern: recently laid off from her long-time employer, is processing divorce from ex-husband, and is still processing the grief and loss caused by cancer.  Her goal for counseling is to feel like she has control over her life again.  Counselor believes pt will benefit from individual talk therapy, continued validation of pt's reactions, and normalization of pt's experiences.   Next session: 3/8 at 11a.  Westly Pam, [place] [facility] [phone]

## 2016-07-10 NOTE — Progress Notes (Signed)
Subjective: Patient presents today status post excision of benign lesion left foot. Date of surgery 06/29/2016. Patient states that the left foot is been painful today is having shooting pains across the top of the foot.  Objective: Skin incisions are well coapted with sutures intact. No sign of infectious process noted. Moderate edema noted.  Assessment: Status post excision of benign lesion left foot. Date of surgery 06/29/2016.  Plan of care: #1 patient was evaluated #2 dressings were changed #3 return to clinic in 1 week for possible suture removal #4 prescription for Percocet 5/325

## 2016-07-11 ENCOUNTER — Ambulatory Visit: Payer: BLUE CROSS/BLUE SHIELD | Admitting: Licensed Clinical Social Worker

## 2016-07-11 ENCOUNTER — Other Ambulatory Visit: Payer: BLUE CROSS/BLUE SHIELD

## 2016-07-11 ENCOUNTER — Ambulatory Visit (INDEPENDENT_AMBULATORY_CARE_PROVIDER_SITE_OTHER): Payer: BLUE CROSS/BLUE SHIELD | Admitting: Podiatry

## 2016-07-11 DIAGNOSIS — M67472 Ganglion, left ankle and foot: Secondary | ICD-10-CM

## 2016-07-11 DIAGNOSIS — M7989 Other specified soft tissue disorders: Secondary | ICD-10-CM

## 2016-07-11 DIAGNOSIS — Z9889 Other specified postprocedural states: Secondary | ICD-10-CM

## 2016-07-11 DIAGNOSIS — M799 Soft tissue disorder, unspecified: Secondary | ICD-10-CM

## 2016-07-13 ENCOUNTER — Ambulatory Visit: Payer: BLUE CROSS/BLUE SHIELD | Admitting: Internal Medicine

## 2016-07-14 NOTE — Progress Notes (Signed)
[facility] Counseling Note  Second individual session with client. Pt discussed upcoming road trip and her excitement to spend time focused on herself. Pt sees the trip as an opportunity to bring control back into her life; I.e., she can make decisions about how long the trip will be, where to stop, etc.  When counselor asked questions concerning pt's internal process, client quickly became tearful. Pt spent majority of session discussing how she is "just now" processing her many losses. Pt reported that she has "pushed aside any thoughts" about these losses due to the enormity of battling cancer, but feels that she is now in a place to process her grief. Pt stated that these losses have affected her ability to trust others and she has constructed an internal wall to protect herself. Pt also fears that she's taking too long to process these losses.   Pt could benefit from interventions focused on processing her grief.  Next appointment: 3/15 @ 11a.  Westly Pam, [place] [facility] [phone]

## 2016-07-17 NOTE — Progress Notes (Signed)
Subjective: Jaquala Fuller is a 46 y.o. is seen today in office s/p left ankle soft tissue mass excision (adiopose tissue) and STJ injection preformed on 06/29/16. They state their pain is improved. She gets an occasional sharp pain strictly along the incision which she denies any shooting pains to her toes up the foot/ankle. She is continuing the CAM boot. Denies any systemic complaints such as fevers, chills, nausea, vomiting. No calf pain, chest pain, shortness of breath.   Objective: General: No acute distress, AAOx3  DP/PT pulses palpable 2/4, CRT < 3 sec to all digits.  Protective sensation intact. Motor function intact.  Left foot: Incision is well coapted without any evidence of dehiscence and suture/staples are intact. There is no surrounding erythema, ascending cellulitis, fluctuance, crepitus, malodor, drainage/purulence. There is mild edema around the surgical site. There is minimal pain along the surgical site. Negative tinel sign No other areas of tenderness to bilateral lower extremities.  No other open lesions or pre-ulcerative lesions.  No pain with calf compression, swelling, warmth, erythema.   Assessment and Plan:  Status post left foot surgery, doing well with no complications   -Treatment options discussed including all alternatives, risks, and complications -staples wereremoved today. Sutures remain intact. Antibiotic was applied followed by a bandage. See the dressing clean, dry, intact. -Continue cam boot. -Ice/elevation -Pain medication as needed. -Monitor for any clinical signs or symptoms of infection and DVT/PE and directed to call the office immediately should any occur or go to the ER. -Follow-up as scheduled for suture removal or sooner if any problems arise. In the meantime, encouraged to call the office with any questions, concerns, change in symptoms.   Celesta Gentile, DPM

## 2016-07-20 ENCOUNTER — Ambulatory Visit (INDEPENDENT_AMBULATORY_CARE_PROVIDER_SITE_OTHER): Payer: BLUE CROSS/BLUE SHIELD | Admitting: Podiatry

## 2016-07-20 ENCOUNTER — Encounter: Payer: Self-pay | Admitting: Podiatry

## 2016-07-20 VITALS — BP 109/89 | HR 78 | Resp 18

## 2016-07-20 DIAGNOSIS — M7989 Other specified soft tissue disorders: Secondary | ICD-10-CM

## 2016-07-20 DIAGNOSIS — M799 Soft tissue disorder, unspecified: Secondary | ICD-10-CM

## 2016-07-20 NOTE — Progress Notes (Signed)
Subjective: [name] is a 46 y.o. is seen today in office s/p left ankle soft tissue mass excision (adiopose tissue) and STJ injection preformed on 06/29/16. They state their pain has improved and it does feel better than it did prior to surgery. The sharp pain has improved. She gets some occasional discomfort but she states she has been on her foot "right much".  She is continuing the CAM boot. Denies any systemic complaints such as fevers, chills, nausea, vomiting. No calf pain, chest pain, shortness of breath.   Objective: General: No acute distress, AAOx3  DP/PT pulses palpable 2/4, CRT < 3 sec to all digits.  Protective sensation intact. Motor function intact.  Left foot: Incision is well coapted without any evidence of dehiscence and staples are intact. There is no surrounding erythema, ascending cellulitis, fluctuance, crepitus, malodor, drainage/purulence. There is mild but improved edema around the surgical site. There is minimal pain along the surgical site. Negative tinel sign No other areas of tenderness to bilateral lower extremities.  No other open lesions or pre-ulcerative lesions.  No pain with calf compression, swelling, warmth, erythema.   Assessment and Plan:  Status post left foot surgery, doing well with no complications   -Treatment options discussed including all alternatives, risks, and complications -Staples were removed today. Sutures remain intact. Antibiotic was applied followed by a bandage. See the dressing clean, dry, intact. -Continue cam boot. She can start to transition to a regular shoe as tolerated.  -Ice/elevation -Pain medication as needed. -Monitor for any clinical signs or symptoms of infection and DVT/PE and directed to call the office immediately should any occur or go to the ER. -Follow-up as scheduled for suture removal or sooner if any problems arise. In the meantime, encouraged to call the office with any questions, concerns, change in  symptoms.   Celesta Gentile, DPM

## 2016-07-25 ENCOUNTER — Other Ambulatory Visit: Payer: BLUE CROSS/BLUE SHIELD

## 2016-07-25 ENCOUNTER — Ambulatory Visit: Payer: BLUE CROSS/BLUE SHIELD | Admitting: Gynecologic Oncology

## 2016-07-25 NOTE — Progress Notes (Deleted)
Follow Up Note: Gyn-Onc  [name] 46 y.o. female  CC:  No chief complaint on file.  Assessment/Plan:   [name] is a 46 y.o. 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 s/p adjuvant adjuvant carboplatin/paclitaxel chemotherapy (completed November, 2016).  HPI:  [name] is a 46 year old female woman who presented to the [facility] Emergency department in July, 2016 with acute onset abdominal pain, dyspnea, and bilateral ovarian masses seen on CT. She was transferred to [facility] from [facility] for further evaluation and treatment. She reported a history of endometriosis and infertility but no other gynecologic history. Her last pap smear was multiple years prior to presentation and she denied a history of dysplasia along with abnormal uterine bleeding.  Preop Labs and Imaging:  CA 125: 554  CEA: 0.9  CA 19-9: 587  CT 11/09/14: IMPRESSION: 1. Large multiloculated partially cystic partially solid mass replacing the left ovary. The right ovary is also enlarged, heterogeneous, and abnormal in appearance with the inferior margin immediately abutting this mass lesion. Despite trace fat plane along the superior aspect of the right ovary and cystic/solid mass, the right ovary is markedly abnormal in appearance and certainly involved. These findings are concerning for a primary ovarian malignancy involving both ovaries. A pelvic ultrasound has been ordered the time of examination and may be useful in further evaluation of these pelvic masses. 2. The base of the appendix is slightly dilated and demonstrates hyperenhancement of the wall. Additionally, the lateral aspect of the base the appendix immediately abuts the right side of the pelvic mass. These findings may reflect appendiceal involvement. 3. Minimal abutment of the sigmoid colon with relatively preserved fat plane throughout. No differential dilatation of the sigmoid colon or obvious tethering to suggest sigmoid involvement.  However, no discrete fat plane is seen within a short segment as described above. 4. Trace ascites. No hydronephrosis or omental caking. 5. Minimal perihepatic free fluid as well less enlarged portacaval lymph node node. 6. Subtle posterior right hepatic dome hypodensity is too small to characterize. While this lesion does not have the typical appearance of a metastasis, this finding remains indeterminate on this suboptimally timed study. If clinically indicated, a MRI of the abdomen without and with contrast may be obtained for further evaluation.  On 11/10/14, she underwent an exploratory laparotomy and radical tumor debulking with lysis of adhesions for 90 minutes and bilateral ureterolysis for retroperitoneal fibrosis by Dr. Thurston Pounds at [facility].  Operative findings included:  1. Chocolate colored ascites immediately noted upon abdominal entry consistent with ruptured endometrioma  2. Bilateral ovarian masses with chocolate fluid, left side measuring ~12cm with ~8cm solid component, right side measuring ~6cm (frozen section of the left ovary showing invasive carcinoma concerning for clear cell)  3. 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 10 cm fibroid uterus with normal cervix to palpation  5. Normal abdominal survey with normal appearing omentum, normal appearing appendix retroperitoneal and running cephalad along the cecum, normal small and large bowel without evidence of cancer spread, smooth diaphragms bilaterally, smooth liver edge with hepatomegaly, normal stomach and normal kidneys on palpation  6. Sigmoid colon densely adherent to the uterus and posterior cervix precluding cervical excision  7. No pathologically enlarged lymph nodes but discolored shotty left obturator and aortic lymph nodes excised  8. R0 resection with no gross residual cancer at surgery close.  Final pathology revealed:  A: Ovary and  tube, left salpingo-oophorectomy  Left ovary:  - Ovarian clear cell carcinoma with associated necrosis (please see synoptic report)  - Endometriosis  Left fallopian tube:  - Negative for involvement by clear cell carcinoma  B: Ovary and tube, right salpingo-oophorectomy  Right ovary:  - Ovarian clear cell carcinoma (please see synoptic report)  - Endometriosis  Right fallopian tube:  - Negative for involvement by clear cell carcinoma  - Endometriosis  C: Uterus, supracervical hysterectomy  - Negative for involvement by clear cell carcinoma  Additional findings:  Endometrium: - Weakly proliferative endometrium  - No hyperplasia or carcinoma identified  Myometrium:  - Extensive adenomyosis  D: Lymph node, aortic, regional resection  - One lymph node negative for metastatic carcinoma (0/1)  E: Lymph node, right pelvic, regional resection  - Four lymph nodes negative for metastatic carcinoma (0/4)  F: Lymph node, left pelvic, regional resection  - Two lymph nodes negative for metastatic carcinoma (0/2)  G: Omentum, omentectomy  - Negative for involvement by clear cell carcinoma  - Mature adipose tissue  H: Peritoneum, biopsy  - Negative for involvement by clear cell carcinoma  - Fibroadipose tissue  A: Pelvic washing  - No malignant cells identified (see comment)  B: Diaphragm, brushing/scraping  - No malignant cells identified  - Reactive mesothelial cells  Interval History:   She went on to receive 6 cycles of adjuvant carboplatin and paclitaxel chemotherapy completed in November, 2016.  CA 125 on 04/21/16 was stable at 25.2 The patient presents today with her significant other for surveillance visit. ***.    Review of Systems  Constitutional: Feels well.  No fever, chills, early satiety, unintentional weight loss or gain.  Appetite improving.  Cardiovascular: No chest pain, shortness of breath, or edema.  Pulmonary: No cough or wheeze.  Gastrointestinal: No nausea,  vomiting, or diarrhea. No bright red blood per rectum or change in bowel movement.  Genitourinary: No frequency, urgency. No vaginal bleeding or discharge.  Musculoskeletal: No myalgia or joint pain. Neurologic: No weakness, numbness, or change in gait.  Psychology: No depression, anxiety, or insomnia.  Current Meds:  Outpatient Encounter Prescriptions as of 07/25/2016  Medication Sig  . ALPRAZolam (XANAX) 1 MG tablet Take 1 tablet (1 mg total) by mouth 2 (two) times daily as needed for anxiety. To fill Jul 12, 2016  . AMBULATORY NON FORMULARY MEDICATION Medication Name: 1 manuel massage every 2 weeks  . cephALEXin (KEFLEX) 500 MG capsule Take 500 mg by mouth 3 (three) times daily.  . clobetasol (OLUX) 0.05 % topical foam Apply topically 2 (two) times daily.  . Cyanocobalamin (VITAMIN B-12 PO) Take 2 tablets by mouth daily.  . cyclobenzaprine (FLEXERIL) 5 MG tablet Take 1 tablet (5 mg total) by mouth 2 (two) times daily.  Marland Kitchen estradiol (CLIMARA) 0.05 mg/24hr patch Place 1 patch (0.05 mg total) onto the skin once a week.  . fluticasone (FLONASE) 50 MCG/ACT nasal spray Place 2 sprays into both nostrils daily.  Marland Kitchen gabapentin (NEURONTIN) 600 MG tablet Take 2 tablets (1,200 mg total) by mouth 3 (three) times daily.  Marland Kitchen guaiFENesin (MUCINEX) 600 MG 12 hr tablet Take 1 tablet (600 mg total) by mouth 2 (two) times daily as needed for cough or to loosen phlegm.  . hydrochlorothiazide (MICROZIDE) 12.5 MG capsule Take 1 capsule (12.5 mg total) by mouth daily.  Marland Kitchen levothyroxine (SYNTHROID, LEVOTHROID) 50 MCG tablet Take 1 tablet (50 mcg total) by mouth daily before breakfast.  . lisinopril (PRINIVIL,ZESTRIL) 5 MG tablet Take  0.5 tablets (2.5 mg total) by mouth daily.  . meloxicam (MOBIC) 15 MG tablet Take 1 tablet (15 mg total) by mouth daily as needed for pain.  . montelukast (SINGULAIR) 10 MG tablet Take 1 tablet (10 mg total) by mouth at bedtime.  Marland Kitchen oxyCODONE-acetaminophen (PERCOCET/ROXICET) 5-325 MG tablet  Take 1-2 tablets by mouth every 4 (four) hours as needed for severe pain.  Marland Kitchen oxyCODONE-acetaminophen (ROXICET) 5-325 MG tablet Take 1 tablet by mouth every 8 (eight) hours as needed for severe pain.  . pantoprazole (PROTONIX) 40 MG tablet Take 1 tablet twice a day for 2 weeks then 1 tablet daily.  . promethazine (PHENERGAN) 25 MG tablet   . promethazine (PHENERGAN) 25 MG tablet Take 25 mg by mouth every 8 (eight) hours as needed for nausea or vomiting.  . sodium chloride (OCEAN) 0.65 % SOLN nasal spray Place 1 spray into both nostrils daily as needed for congestion.  . SUMAtriptan (IMITREX) 100 MG tablet Take 1 tablet (100 mg total) by mouth every 2 (two) hours as needed for migraine. May repeat in 2 hours if headache persists or recurs.  Marland Kitchen tetrahydrozoline-zinc (VISINE-AC) 0.05-0.25 % ophthalmic solution Place 2 drops into both eyes 3 (three) times daily as needed (dry eyes).  . traMADol (ULTRAM) 50 MG tablet Take 1 tablet (50 mg total) by mouth every 12 (twelve) hours as needed.  . venlafaxine XR (EFFEXOR XR) 150 MG 24 hr capsule Take 1 capsule (150 mg total) by mouth daily with breakfast.   No facility-administered encounter medications on file as of 07/25/2016.     Allergy:  Allergies  Allergen Reactions  . Moxifloxacin Anaphylaxis    needs epinephrine shot  . Quinolones Anaphylaxis, Shortness Of Breath and Swelling  . Doxycycline Nausea And Vomiting  . Escitalopram Oxalate Other (See Comments)     fatigue  . Sulfonamide Derivatives Swelling and Rash    needs epinephrine shot--rash and lip swelling    Social Hx:   Social History   Social History  . Marital status: Divorced    Spouse name: N/A  . Number of children: 0  . Years of education: N/A   Occupational History  . Not on file.   Social History Main Topics  . Smoking status: Never Smoker  . Smokeless tobacco: Never Used  . Alcohol use 0.0 oz/week     Comment: Rarely  . Drug use: No  . Sexual activity: Not on file    Other Topics Concern  . Not on file   Social History Narrative   Lives with boyfriend in a one story home.  Has no children.     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     Education: college.    Past Surgical Hx:  Past Surgical History:  Procedure Laterality Date  . ABDOMINAL HYSTERECTOMY  11/10/14   at [facility], Exp lap, supracervical hyst, BSO, infracolic omentectomy, lymphadenectomy, aortic lymph node sampling  . IR GENERIC HISTORICAL  05/12/2016   IR REMOVAL TUN ACCESS W/ PORT W/O FL MOD SED 05/12/2016 [facility]-INTERV RAD    Past Medical Hx:  Past Medical History:  Diagnosis Date  . Anxiety   . Elevated blood pressure, situational 05/05/2016  . Endometriosis   . Exercise-induced asthma 09/08/2015  . Ovarian cancer, bilateral ([place]) 11/28/2014  . PONV (postoperative nausea and vomiting)   . Thyroid disease     Family Hx:  Family History  Problem Relation Age of Onset  . Hypertension Mother   . Diabetes Mother   .  Stroke Mother     Deceased  . Atrial fibrillation Father     Living  . Diabetes Maternal Aunt   . Heart failure Maternal Grandmother   . Heart attack Maternal Grandfather   . Dementia Paternal Grandmother   . Dementia Paternal Grandfather   . Healthy Brother     Vitals:  Last menstrual period 11/02/2014.  Physical Exam:  General: Well developed, well nourished female in no acute distress. Alert and oriented x 3.  Cardiovascular: Regular rate and rhythm. S1 and S2 normal.  Lungs: Clear to auscultation bilaterally. No wheezes/crackles/rhonchi noted.  Skin: No rashes or lesions present. Back: No CVA tenderness.  Abdomen: Abdomen soft, non-tender and morbidly obese. Active bowel sounds in all quadrants. 41 staples removed without difficulty.  Erythema noted around the staple insertion sites on the upper abdomen with moderate erythema present surrounding the lower aspect of the incision.  2 cm of induration noted on either side of the lower incision as well.  Increased warmth to  palpation.  No drainage noted or expressed.  Tender with light palpation.  1/2 inch steri strips applied.      Extremities: Edema noted bilaterally, slightly more in the left lower extremity but non-pitting.  No erythema or tenderness noted with the LLE.  Negative Homans sign with the LLE.  No bilateral cyanosis or clubbing.    Donaciano Eva, MD 07/25/2016, 1:37 PM

## 2016-07-28 NOTE — Progress Notes (Signed)
[facility] Counseling Note  Called pt to f/u and schedule next individual counseling appointment. LVM. Will follow up 3/28 if no returned call.  Westly Pam, [place] LPCA [place]

## 2016-08-04 ENCOUNTER — Encounter: Payer: Self-pay | Admitting: Podiatry

## 2016-08-04 ENCOUNTER — Ambulatory Visit (INDEPENDENT_AMBULATORY_CARE_PROVIDER_SITE_OTHER): Payer: Self-pay | Admitting: Podiatry

## 2016-08-04 DIAGNOSIS — L24A9 Irritant contact dermatitis due friction or contact with other specified body fluids: Secondary | ICD-10-CM

## 2016-08-04 DIAGNOSIS — T148XXA Other injury of unspecified body region, initial encounter: Secondary | ICD-10-CM

## 2016-08-04 NOTE — Progress Notes (Signed)
[facility] Counseling Note  Called client to f/u on interest in scheduling next counseling appt. Client confirmed and scheduled for 3/30 at [place].  Westly Pam [phone]

## 2016-08-05 NOTE — Progress Notes (Signed)
[facility] Counseling Note  Third individual session with client. Counselor and AF discussed pt's grief surrounding many different losses she's experienced. Counselor and pt processed emotions and discussed coping strategies. Next session: 4/6 at [place].  Westly Pam, [place] LPCA [place]

## 2016-08-05 NOTE — Progress Notes (Signed)
Subjective: [name] is a 46 y.o. is seen today in office s/p left ankle soft tissue mass excision (adiopose tissue) and STJ injection preformed on 06/29/16. She states that she is doing much better compared to prior to surgery. Her symptoms are almost resolved that she was given sharp pain. She states that she's been able to wear shoes and new reacted without any problems. She denies any redness or drainage or any swelling. Overall she feels that she is happy with the outcome so far. Denies any systemic complaints such as fevers, chills, nausea, vomiting. No calf pain, chest pain, shortness of breath.   Objective: General: No acute distress, AAOx3  DP/PT pulses palpable 2/4, CRT < 3 sec to all digits.  Protective sensation intact. Motor function intact.  Left foot: Incision is well coapted without any evidence of dehiscence except for small area along the central aspect the incision which has a small hyperkeratotic lesion. Upon debridement there was straight clear, serous drainage expressed and there is no pus. This area is small but does probe approximately 0.8 cm however there is no probing to bone tunneling or undermining. There is no swelling erythema, ascending cellulitis. There is no fluctuance or crepitus or malodor. There is no conical signs of infection. Negative tinel sign No other areas of tenderness to bilateral lower extremities.  No other open lesions or pre-ulcerative lesions.  No pain with calf compression, swelling, warmth, erythema.   Assessment and Plan:  Status post left foot surgery, serous drainage expressed . -Treatment options discussed including all alternatives, risks, and complications -I debrided area of hyperkeratotic tissue at night was able to express serous drainage. Area was cleaned. A single Steri-Strip was placed over anesthetic ointment and bandage. Ace bandage was also applied help with swelling although minimal today. Recommended with a cam boot. She has  an ankle brace that she can wear. I recommended limited activity.  -Monitor for any clinical signs or symptoms of infection and DVT/PE and directed to call the office immediately should any occur or go to the ER. -Follow-up as scheduled for suture removal or sooner if any problems arise. In the meantime, encouraged to call the office with any questions, concerns, change in symptoms.   Celesta Gentile, DPM

## 2016-08-15 ENCOUNTER — Ambulatory Visit (INDEPENDENT_AMBULATORY_CARE_PROVIDER_SITE_OTHER): Payer: Self-pay | Admitting: Podiatry

## 2016-08-15 DIAGNOSIS — M779 Enthesopathy, unspecified: Secondary | ICD-10-CM

## 2016-08-15 DIAGNOSIS — M799 Soft tissue disorder, unspecified: Secondary | ICD-10-CM

## 2016-08-15 DIAGNOSIS — M7989 Other specified soft tissue disorders: Secondary | ICD-10-CM

## 2016-08-17 NOTE — Progress Notes (Addendum)
Subjective: [name] is a 46 y.o. is seen today in office s/p left ankle soft tissue mass excision (adiopose tissue) and STJ injection preformed on 06/29/16. She states that the wound is healing well and she has not had any drainage or pus coming from the area. She did go hiking [place] miles in the [place] and she had pain to her ankle during this issue was not wearing any type of brace. She states that realistically the [place] brace is something she is not going to wear. Denies any systemic complaints such as fevers, chills, nausea, vomiting. No calf pain, chest pain, shortness of breath.   Objective: General: No acute distress, AAOx3  DP/PT pulses palpable 2/4, CRT < 3 sec to all digits.  Protective sensation intact. Motor function intact.  Left foot: Incision is well coapted without any evidence of dehiscence except for small area along the central aspect the incision which has a small hyperkeratotic lesion. After debridement this is superficial and there is no probing, undermining or tunneling. This appears to be healing very well and only has a depth of approximately 0.2 cm. There is no drainage or pus expressed understanding erythema, ascending cellulitis. There is no fluctuance, crepitus, malodor. Negative tinel sign No other areas of tenderness to bilateral lower extremities.  No other open lesions or pre-ulcerative lesions.  No pain with calf compression, swelling, warmth, erythema.   Assessment and Plan:  Status post left foot surgery, with wound healing . -Treatment options discussed including all alternatives, risks, and complications -continue with antibody ointment dressing changes along the wound from the incision. -Compression anklet was dispensed today. -Ankle brace as also dispensed today. She can do activities as tolerated but would recommend wearing the brace for short term. -Also we will start physical therapy. A rx was provided today for [facility]  Celesta Gentile, DPM

## 2016-08-22 ENCOUNTER — Ambulatory Visit: Payer: Self-pay | Admitting: Gynecologic Oncology

## 2016-08-22 ENCOUNTER — Telehealth: Payer: Self-pay | Admitting: *Deleted

## 2016-08-22 NOTE — Telephone Encounter (Signed)
Patient missed her appt for [facility]ady. I have called her and rescheduled the appt for Wednesday at 2pm. Patient aware of date/time

## 2016-08-24 ENCOUNTER — Ambulatory Visit (HOSPITAL_BASED_OUTPATIENT_CLINIC_OR_DEPARTMENT_OTHER): Payer: 59

## 2016-08-24 ENCOUNTER — Encounter: Payer: Self-pay | Admitting: Gynecologic Oncology

## 2016-08-24 ENCOUNTER — Ambulatory Visit: Payer: 59 | Attending: Gynecologic Oncology | Admitting: Gynecologic Oncology

## 2016-08-24 VITALS — BP 147/89 | HR 86 | Temp 98.7°F | Resp 18 | Wt 306.2 lb

## 2016-08-24 DIAGNOSIS — Z09 Encounter for follow-up examination after completed treatment for conditions other than malignant neoplasm: Secondary | ICD-10-CM | POA: Diagnosis not present

## 2016-08-24 DIAGNOSIS — C569 Malignant neoplasm of unspecified ovary: Secondary | ICD-10-CM

## 2016-08-24 DIAGNOSIS — Z882 Allergy status to sulfonamides status: Secondary | ICD-10-CM | POA: Diagnosis not present

## 2016-08-24 DIAGNOSIS — F329 Major depressive disorder, single episode, unspecified: Secondary | ICD-10-CM | POA: Diagnosis not present

## 2016-08-24 DIAGNOSIS — R030 Elevated blood-pressure reading, without diagnosis of hypertension: Secondary | ICD-10-CM | POA: Insufficient documentation

## 2016-08-24 DIAGNOSIS — F419 Anxiety disorder, unspecified: Secondary | ICD-10-CM | POA: Diagnosis not present

## 2016-08-24 DIAGNOSIS — Z9221 Personal history of antineoplastic chemotherapy: Secondary | ICD-10-CM

## 2016-08-24 DIAGNOSIS — Z8543 Personal history of malignant neoplasm of ovary: Secondary | ICD-10-CM | POA: Diagnosis not present

## 2016-08-24 DIAGNOSIS — E28310 Symptomatic premature menopause: Secondary | ICD-10-CM | POA: Diagnosis not present

## 2016-08-24 DIAGNOSIS — N800 Endometriosis of uterus: Secondary | ICD-10-CM | POA: Insufficient documentation

## 2016-08-24 DIAGNOSIS — E079 Disorder of thyroid, unspecified: Secondary | ICD-10-CM | POA: Diagnosis not present

## 2016-08-24 MED ORDER — ESTRADIOL 0.05 MG/24HR TD PTTW: 1.0000 | MEDICATED_PATCH | TRANSDERMAL | 12 refills | Status: DC

## 2016-08-24 NOTE — Progress Notes (Signed)
Follow Up Note: Gyn-Onc  [name] 46 y.o. female  CC:  Chief Complaint  Patient presents with  . Ovarian Cancer   Assessment/Plan:   [name] is a 46 y.o. 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 s/p adjuvant carboplatin/paclitaxel chemotherapy (completed November, 2016).  No evidence of recurrence on today's exam.  Symptoms of depression and "chemo brain" discussed strategies such as yoga and acupuncture. Will refer to psychiatry for possible underlying depression.  Surgical menopause and climara patch not adhering well - will change to vivelle dot.  Recommend repeat CA 125 today.  Recommend follow-up in 3 months (3 monthly follow-up until November, 2018), followed by 6 monthly exam.  HPI:  [name] is a 46 year old female woman who presented to the [facility] Emergency department in July, 2016 with acute onset abdominal pain, dyspnea, and bilateral ovarian masses seen on CT. She was transferred to [facility] from [facility] for further evaluation and treatment. She reported a history of endometriosis and infertility but no other gynecologic history. Her last pap smear was multiple years prior to presentation and she denied a history of dysplasia along with abnormal uterine bleeding.  Preop Labs and Imaging:  CA 125: 554  CEA: 0.9  CA 19-9: 587  CT 11/09/14: IMPRESSION: 1. Large multiloculated partially cystic partially solid mass replacing the left ovary. The right ovary is also enlarged, heterogeneous, and abnormal in appearance with the inferior margin immediately abutting this mass lesion. Despite trace fat plane along the superior aspect of the right ovary and cystic/solid mass, the right ovary is markedly abnormal in appearance and certainly involved. These findings are concerning for a primary ovarian malignancy involving both ovaries. A pelvic ultrasound has been ordered the time of examination and may be useful in further evaluation of these pelvic  masses. 2. The base of the appendix is slightly dilated and demonstrates hyperenhancement of the wall. Additionally, the lateral aspect of the base the appendix immediately abuts the right side of the pelvic mass. These findings may reflect appendiceal involvement. 3. Minimal abutment of the sigmoid colon with relatively preserved fat plane throughout. No differential dilatation of the sigmoid colon or obvious tethering to suggest sigmoid involvement. However, no discrete fat plane is seen within a short segment as described above. 4. Trace ascites. No hydronephrosis or omental caking. 5. Minimal perihepatic free fluid as well less enlarged portacaval lymph node node. 6. Subtle posterior right hepatic dome hypodensity is too small to characterize. While this lesion does not have the typical appearance of a metastasis, this finding remains indeterminate on this suboptimally timed study. If clinically indicated, a MRI of the abdomen without and with contrast may be obtained for further evaluation.  On 11/10/14, she underwent an exploratory laparotomy and radical tumor debulking with lysis of adhesions for 90 minutes and bilateral ureterolysis for retroperitoneal fibrosis by Dr. Thurston Pounds at [facility]  Operative findings included:  1. Chocolate colored ascites immediately noted upon abdominal entry consistent with ruptured endometrioma  2. Bilateral ovarian masses with chocolate fluid, left side measuring ~12cm with ~8cm solid component, right side measuring ~6cm (frozen section of the left ovary showing invasive carcinoma concerning for clear cell)  3. 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 10 cm fibroid uterus with normal cervix to palpation  5. Normal abdominal survey with normal appearing omentum, normal appearing appendix retroperitoneal and running cephalad along the cecum, normal small and large bowel  without evidence  of cancer spread, smooth diaphragms bilaterally, smooth liver edge with hepatomegaly, normal stomach and normal kidneys on palpation  6. Sigmoid colon densely adherent to the uterus and posterior cervix precluding cervical excision  7. No pathologically enlarged lymph nodes but discolored shotty left obturator and aortic lymph nodes excised  8. R0 resection with no gross residual cancer at surgery close.  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 (please see synoptic report)  - Endometriosis  Left fallopian tube:  - Negative for involvement by clear cell carcinoma  B: Ovary and tube, right salpingo-oophorectomy  Right ovary:  - Ovarian clear cell carcinoma (please see synoptic report)  - Endometriosis  Right fallopian tube:  - Negative for involvement by clear cell carcinoma  - Endometriosis  C: Uterus, supracervical hysterectomy  - Negative for involvement by clear cell carcinoma  Additional findings:  Endometrium: - Weakly proliferative endometrium  - No hyperplasia or carcinoma identified  Myometrium:  - Extensive adenomyosis  D: Lymph node, aortic, regional resection  - One lymph node negative for metastatic carcinoma (0/1)  E: Lymph node, right pelvic, regional resection  - Four lymph nodes negative for metastatic carcinoma (0/4)  F: Lymph node, left pelvic, regional resection  - Two lymph nodes negative for metastatic carcinoma (0/2)  G: Omentum, omentectomy  - Negative for involvement by clear cell carcinoma  - Mature adipose tissue  H: Peritoneum, biopsy  - Negative for involvement by clear cell carcinoma  - Fibroadipose tissue  A: Pelvic washing  - No malignant cells identified (see comment)  B: Diaphragm, brushing/scraping  - No malignant cells identified  - Reactive mesothelial cells  Interval History:   She went on to receive 6 cycles of adjuvant carboplatin  and paclitaxel chemotherapy completed in November, 2016.  CA 125 on 04/21/16 was stable at 25.2 The patient presents today with her significant other for surveillance visit. 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  She feels depressed and "snappy".  Her climara patch does not adhere well and she notices hot flashes.  Review of Systems  Constitutional: Feels well.  No fever, chills, early satiety, unintentional weight loss or gain.  Appetite improving.  Cardiovascular: No chest pain, shortness of breath, or edema.  Pulmonary: No cough or wheeze.  Gastrointestinal: No nausea, vomiting, or diarrhea. No bright red blood per rectum or change in bowel movement.  Genitourinary: No frequency, urgency. No vaginal bleeding or discharge.  Musculoskeletal: No myalgia or joint pain. Neurologic: No weakness, numbness, or change in gait.  Psychology: No depression, anxiety, or insomnia.  Current Meds:  Outpatient Encounter Prescriptions as of 08/24/2016  Medication Sig  . ALPRAZolam (XANAX) 1 MG tablet Take 1 tablet (1 mg total) by mouth 2 (two) times daily as needed for anxiety. To fill Jul 12, 2016  . AMBULATORY NON FORMULARY MEDICATION Medication Name: 1 manuel massage every 2 weeks  . cephALEXin (KEFLEX) 500 MG capsule Take 500 mg by mouth 3 (three) times daily.  . clobetasol (OLUX) 0.05 % topical foam Apply topically 2 (two) times daily.  . Cyanocobalamin (VITAMIN B-12 PO) Take 2 tablets by mouth daily.  . cyclobenzaprine (FLEXERIL) 5 MG tablet Take 1 tablet (5 mg total) by mouth 2 (two) times daily.  Marland Kitchen estradiol (CLIMARA) 0.05 mg/24hr patch Place 1 patch (0.05 mg total) onto the skin once a week.  . fluticasone (FLONASE) 50 MCG/ACT nasal spray Place 2  sprays into both nostrils daily.  Marland Kitchen gabapentin (NEURONTIN) 600 MG tablet Take 2 tablets (1,200 mg total) by mouth 3 (three) times daily.  Marland Kitchen guaiFENesin (MUCINEX) 600 MG 12 hr tablet Take 1 tablet (600 mg total) by mouth 2 (two) times daily as needed  for cough or to loosen phlegm.  . hydrochlorothiazide (MICROZIDE) 12.5 MG capsule Take 1 capsule (12.5 mg total) by mouth daily.  Marland Kitchen levothyroxine (SYNTHROID, LEVOTHROID) 50 MCG tablet Take 1 tablet (50 mcg total) by mouth daily before breakfast.  . lisinopril (PRINIVIL,ZESTRIL) 5 MG tablet Take 0.5 tablets (2.5 mg total) by mouth daily.  . meloxicam (MOBIC) 15 MG tablet Take 1 tablet (15 mg total) by mouth daily as needed for pain.  . montelukast (SINGULAIR) 10 MG tablet Take 1 tablet (10 mg total) by mouth at bedtime.  Marland Kitchen oxyCODONE-acetaminophen (PERCOCET/ROXICET) 5-325 MG tablet Take 1-2 tablets by mouth every 4 (four) hours as needed for severe pain.  Marland Kitchen oxyCODONE-acetaminophen (ROXICET) 5-325 MG tablet Take 1 tablet by mouth every 8 (eight) hours as needed for severe pain.  . pantoprazole (PROTONIX) 40 MG tablet Take 1 tablet twice a day for 2 weeks then 1 tablet daily.  . promethazine (PHENERGAN) 25 MG tablet   . promethazine (PHENERGAN) 25 MG tablet Take 25 mg by mouth every 8 (eight) hours as needed for nausea or vomiting.  . sodium chloride (OCEAN) 0.65 % SOLN nasal spray Place 1 spray into both nostrils daily as needed for congestion.  . SUMAtriptan (IMITREX) 100 MG tablet Take 1 tablet (100 mg total) by mouth every 2 (two) hours as needed for migraine. May repeat in 2 hours if headache persists or recurs.  Marland Kitchen tetrahydrozoline-zinc (VISINE-AC) 0.05-0.25 % ophthalmic solution Place 2 drops into both eyes 3 (three) times daily as needed (dry eyes).  . traMADol (ULTRAM) 50 MG tablet Take 1 tablet (50 mg total) by mouth every 12 (twelve) hours as needed.  . venlafaxine XR (EFFEXOR XR) 150 MG 24 hr capsule Take 1 capsule (150 mg total) by mouth daily with breakfast.   No facility-administered encounter medications on file as of 08/24/2016.     Allergy:  Allergies  Allergen Reactions  . Moxifloxacin Anaphylaxis    needs epinephrine shot  . Quinolones Anaphylaxis, Shortness Of Breath and  Swelling  . Doxycycline Nausea And Vomiting  . Escitalopram Oxalate Other (See Comments)     fatigue  . Sulfonamide Derivatives Swelling and Rash    needs epinephrine shot--rash and lip swelling    Social Hx:   Social History   Social History  . Marital status: Divorced    Spouse name: N/A  . Number of children: 0  . Years of education: N/A   Occupational History  . Not on file.   Social History Main Topics  . Smoking status: Never Smoker  . Smokeless tobacco: Never Used  . Alcohol use 0.0 oz/week     Comment: Rarely  . Drug use: No  . Sexual activity: Not on file   Other Topics Concern  . Not on file   Social History Narrative   Lives with boyfriend in a one story home.  Has no children.     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     Education: college.    Past Surgical Hx:  Past Surgical History:  Procedure Laterality Date  . ABDOMINAL HYSTERECTOMY  11/10/14   at [facility], Exp lap, supracervical hyst, BSO, infracolic omentectomy, lymphadenectomy, aortic lymph node sampling  . IR GENERIC HISTORICAL  05/12/2016   IR REMOVAL TUN ACCESS W/ PORT W/O FL MOD SED 05/12/2016 [facility]-INTERV RAD    Past Medical Hx:  Past Medical History:  Diagnosis Date  . Anxiety   . Elevated blood pressure, situational 05/05/2016  . Endometriosis   . Exercise-induced asthma 09/08/2015  . Ovarian cancer, bilateral ([place]) 11/28/2014  . PONV (postoperative nausea and vomiting)   . Thyroid disease     Family Hx:  Family History  Problem Relation Age of Onset  . Hypertension Mother   . Diabetes Mother   . Stroke Mother     Deceased  . Atrial fibrillation Father     Living  . Diabetes Maternal Aunt   . Heart failure Maternal Grandmother   . Heart attack Maternal Grandfather   . Dementia Paternal Grandmother   . Dementia Paternal Grandfather   . Healthy Brother     Vitals:  Blood pressure (!) 147/89, pulse 86, temperature 98.7 F (37.1 C), temperature source Oral, resp. rate 18, weight (!) 306 lb 3.2 oz  (138.9 kg), last menstrual period 11/02/2014.  Physical Exam:  General: Well developed, well nourished female in no acute distress. Alert and oriented x 3.  Cardiovascular: Regular rate and rhythm. S1 and S2 normal.  Lungs: Clear to auscultation bilaterally. No wheezes/crackles/rhonchi noted.  Skin: No rashes or lesions present. Back: No CVA tenderness.  Abdomen: Abdomen soft, non-tender and morbidly obese. Active bowel sounds in all quadrants. 41 staples removed without difficulty.  Erythema noted around the staple insertion sites on the upper abdomen with moderate erythema present surrounding the lower aspect of the incision.  2 cm of induration noted on either side of the lower incision as well.  Increased warmth to palpation.  No drainage noted or expressed.  Tender with light palpation.  1/2 inch steri strips applied.      Extremities: Edema noted bilaterally, slightly more in the left lower extremity but non-pitting.  No erythema or tenderness noted with the LLE.  Negative Homans sign with the LLE.  No bilateral cyanosis or clubbing.    Donaciano Eva, MD 08/24/2016, 2:12 PM

## 2016-08-24 NOTE — Patient Instructions (Signed)
Dr. Serita Grit office will call you with the results of the CA-125 from today when available.We will call you with the referral to the psychologist when appointment made.

## 2016-08-25 ENCOUNTER — Encounter: Payer: Self-pay | Admitting: Gynecologic Oncology

## 2016-08-25 LAB — CA 125: Cancer Antigen (CA) 125: 25.7 U/mL (ref 0.0–38.1)

## 2016-08-25 NOTE — Progress Notes (Signed)
[place] Counseling Note  Called pt and LVM inquiring about setting next appointment.  Westly Pam [place] LP[facility] [place] [phone]

## 2016-08-25 NOTE — Progress Notes (Signed)
Submitted auth request for Estradiol 0.05 mg today.  [employer] cannot perform this prior auth request because the product is a plan exclusion for this member.  If VIVELLE- DOT is prescribed no Josem Kaufmann is required.  Gave denial to the nurse.

## 2016-08-29 ENCOUNTER — Telehealth: Payer: Self-pay | Admitting: *Deleted

## 2016-08-29 NOTE — Telephone Encounter (Signed)
I have called and left a message on the machine with the date/time of her appt in July.

## 2016-08-30 NOTE — Telephone Encounter (Signed)
Told Ms [name] that her CA-125 was stable at 25.7. Pt set up with a psychiatrist  At [facility] on 10-17-16 with Dr. Daron Offer.

## 2016-09-02 NOTE — Progress Notes (Signed)
[name] Counseling Note  Counselor and patient met for fourth individual session.  Counselor and patient discussed client's progress toward her goals and new challenges in her life. Pt reports that many of her original presenting concerns have been resolved in the course of the last few months, but still wishes to meet to continue personal growth and to discuss other challenges.   Next session will be scheduled via telephone once pt knows her new school schedule.   [name], [place] LP[facility] [place]

## 2016-09-05 ENCOUNTER — Encounter: Payer: Self-pay | Admitting: Podiatry

## 2016-09-12 ENCOUNTER — Ambulatory Visit (INDEPENDENT_AMBULATORY_CARE_PROVIDER_SITE_OTHER): Payer: Self-pay | Admitting: Podiatry

## 2016-09-12 ENCOUNTER — Encounter: Payer: Self-pay | Admitting: Podiatry

## 2016-09-12 DIAGNOSIS — M799 Soft tissue disorder, unspecified: Secondary | ICD-10-CM

## 2016-09-12 DIAGNOSIS — M7989 Other specified soft tissue disorders: Secondary | ICD-10-CM

## 2016-09-12 DIAGNOSIS — M779 Enthesopathy, unspecified: Secondary | ICD-10-CM

## 2016-09-15 NOTE — Progress Notes (Signed)
Subjective: 46 year old female presents the also to follow up evaluation of left ankle pain as well as for status post left soft tissue mass excision which is one of 06/29/2016. She states that overall she is doing better and the wound is healed. She did not go to physical therapy and she states that she forgot about it. She is hoping to go back to school this summer. She states that she has been more active and she is able to hike as well as right of bike for about 8 miles. She states that after she walks about 6000 steps a day she starts to get discomfort to her ankle/foot. This is improvement compared where she was last appointment where she had pain all the time. She denies any significant increase in swelling or any redness or any drainage from the incisions. Denies any systemic complaints such as fevers, chills, nausea, vomiting. No acute changes since last appointment, and no other complaints at this time.   Objective: AAO x3, NAD; obese DP/PT pulses palpable bilaterally, CRT less than 3 seconds Incision is well healed today. There is no open sores identifiable is no drainage or pus noted no surrounding erythema, ascending cellulitis. There is no fluctuance, crepitus, malodor. There is no significant tenderness to palpation on surgical site Heather some minimal discomfort on the sinus tarsi within the subtalar joint. There is no pain with ankle subtalar joint range of motion today. This is decrease in medial arch height upon weightbearing. Equinus present.  No open lesions or pre-ulcerative lesions.  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  -At this point she would like to get the more active she started consider running a half marathon. I  encouraged her start physical therapy help get some strength back to her ankle. She is actually making improvements since last appointment but rather to continue with this. I also discussed long-term should likely benefit more from an orthotic inside of her shoe as she cannot wear the brace. I asked discussed shoe gear modifications. -Follow-up as scheduled. -Patient encouraged to call the office with any questions, concerns, change in symptoms.   Celesta Gentile, DPM

## 2016-09-28 NOTE — Progress Notes (Signed)
[place] Counseling Note  Counselor called patient to follow up on patient's interest in continuing counseling sessions. Patient stated that due to life changes (going back to school & starting physical therapy on ankle) she no longer has space in her week for counseling appointments. Additionally, counselor and patient agreed that patient seems to be in a healthier place emotionally and mentally as a result of patient's self-care and counseling sessions. Counselor and patient reflected on work together, and counselor closed phone call with a reminder that she will be available this summer if anything changes and patient wants to come back to counseling.  Westly Pam, [place] [facility] [place]

## 2016-10-10 ENCOUNTER — Ambulatory Visit: Payer: 59 | Admitting: Podiatry

## 2016-10-17 ENCOUNTER — Ambulatory Visit (HOSPITAL_COMMUNITY): Payer: Self-pay | Admitting: Psychiatry

## 2016-10-21 ENCOUNTER — Ambulatory Visit: Payer: 59 | Admitting: Podiatry

## 2016-10-27 ENCOUNTER — Other Ambulatory Visit: Payer: Self-pay | Admitting: Nurse Practitioner

## 2016-10-27 DIAGNOSIS — I10 Essential (primary) hypertension: Secondary | ICD-10-CM

## 2016-11-01 ENCOUNTER — Ambulatory Visit (INDEPENDENT_AMBULATORY_CARE_PROVIDER_SITE_OTHER): Payer: 59 | Admitting: Internal Medicine

## 2016-11-01 ENCOUNTER — Encounter: Payer: Self-pay | Admitting: Internal Medicine

## 2016-11-01 VITALS — BP 138/88 | HR 80 | Ht 68.0 in | Wt 301.0 lb

## 2016-11-01 DIAGNOSIS — M25561 Pain in right knee: Secondary | ICD-10-CM | POA: Diagnosis not present

## 2016-11-01 DIAGNOSIS — M545 Low back pain, unspecified: Secondary | ICD-10-CM | POA: Insufficient documentation

## 2016-11-01 DIAGNOSIS — R0981 Nasal congestion: Secondary | ICD-10-CM

## 2016-11-01 DIAGNOSIS — M25512 Pain in left shoulder: Secondary | ICD-10-CM

## 2016-11-01 DIAGNOSIS — G8929 Other chronic pain: Secondary | ICD-10-CM

## 2016-11-01 MED ORDER — MONTELUKAST SODIUM 10 MG PO TABS: 10.0000 mg | ORAL_TABLET | Freq: Every day | ORAL | 3 refills | Status: DC

## 2016-11-01 MED ORDER — FLUTICASONE PROPIONATE 50 MCG/ACT NA SUSP: 2.0000 | Freq: Every day | NASAL | 5 refills | Status: DC

## 2016-11-01 MED ORDER — TRAMADOL HCL 50 MG PO TABS: 50.0000 mg | ORAL_TABLET | Freq: Three times a day (TID) | ORAL | 1 refills | Status: DC | PRN

## 2016-11-01 MED ORDER — CYCLOBENZAPRINE HCL 5 MG PO TABS: 5.0000 mg | ORAL_TABLET | Freq: Two times a day (BID) | ORAL | 5 refills | Status: DC

## 2016-11-01 NOTE — Assessment & Plan Note (Signed)
More recent onset c/w bursitis vs tendonitis or both, for tx as above, f/u with Sports medicine

## 2016-11-01 NOTE — Patient Instructions (Signed)
Please take all new medication as prescribed - the muscle relaxer (flexeril) and tramadol for pain for now  Please continue all other medications as before, including the anti-inflammatory mobic  Please have the pharmacy call with any other refills you may need.  Please keep your appointments with your specialists as you may have planned  You will be contacted regarding the referral for: Dr Tamala Julian for the left shoulder, right lower back and right knee, for possible steroid treatment

## 2016-11-01 NOTE — Progress Notes (Signed)
Subjective:    Patient ID: [name], female    DOB: [birth year], 46 y.o.   MRN: [id number]  HPI  Here with several complaints   S/p left shoulder pain with a localized area to what sounds the bicep shoulder insertion site, sharp, worse to extend the arm to the side, worse to lie on at night, and nothing else makes better or worse.   Has known left ankle DJD per Dr Jacqualyn Posey,  Mobic 15 gm not always helping   Also Pt continues to have recurring LBP without change in severity, bowel or bladder change, fever, wt loss,  worsening LE pain/numbness/weakness, gait change or falls, with a tightness and soreness to midline then right side  In an "L" kind of distrubution, but does have a sort of tightness even more laterally to the hip.  Same pain before, just more intense  Occas pain radiates to the right knee. Last lumbar films neg for djd or ddd in 2011, with CT abd.pelvis x 2 in 2016 without meniton of lumbar spine issues  S/p bilat ear infection and pna in dec 2017, now resolved.  Does have several wks ongoing nasal allergy symptoms with clearish congestion, itch and sneezing, without fever, pain, ST, cough, swelling or wheezing. Asks for flonase refill  Wt down 5 lbs with more walking for exercise recently  Wt Readings from Last 3 Encounters:  11/01/16 (!) 301 lb (136.5 kg)  08/24/16 (!) 306 lb 3.2 oz (138.9 kg)  07/06/16 (!) 304 lb (137.9 kg)   Past Medical History:  Diagnosis Date  . Anxiety   . Elevated blood pressure, situational 05/05/2016  . Endometriosis   . Exercise-induced asthma 09/08/2015  . Ovarian cancer, bilateral ([place]) 11/28/2014  . PONV (postoperative nausea and vomiting)   . Thyroid disease    Past Surgical History:  Procedure Laterality Date  . ABDOMINAL HYSTERECTOMY  11/10/14   at [facility], Exp lap, supracervical hyst, BSO, infracolic omentectomy, lymphadenectomy, aortic lymph node sampling  . IR GENERIC HISTORICAL  05/12/2016   IR REMOVAL TUN ACCESS W/ PORT W/O FL MOD  SED 05/12/2016 [facility]    reports that she has never smoked. She has never used smokeless tobacco. She reports that she drinks alcohol. She reports that she does not use drugs. family history includes Atrial fibrillation in her father; Dementia in her paternal grandfather and paternal grandmother; Diabetes in her maternal aunt and mother; Healthy in her brother; Heart attack in her maternal grandfather; Heart failure in her maternal grandmother; Hypertension in her mother; Stroke in her mother. Allergies  Allergen Reactions  . Moxifloxacin Anaphylaxis    needs epinephrine shot  . Quinolones Anaphylaxis, Shortness Of Breath and Swelling  . Doxycycline Nausea And Vomiting  . Escitalopram Oxalate Other (See Comments)     fatigue  . Sulfonamide Derivatives Swelling and Rash    needs epinephrine shot--rash and lip swelling   Current Outpatient Prescriptions on File Prior to Visit  Medication Sig Dispense Refill  . ALPRAZolam (XANAX) 1 MG tablet Take 1 tablet (1 mg total) by mouth 2 (two) times daily as needed for anxiety. To fill Jul 12, 2016 60 tablet 5  . clobetasol (OLUX) 0.05 % topical foam Apply topically 2 (two) times daily. 50 g 1  . Cyanocobalamin (VITAMIN B-12 PO) Take 2 tablets by mouth daily.    . cyclobenzaprine (FLEXERIL) 5 MG tablet Take 1 tablet (5 mg total) by mouth 2 (two) times daily. 60 tablet 5  . estradiol (VIVELLE-DOT) 0.05  MG/24HR patch Place 1 patch (0.05 mg total) onto the skin 2 (two) times a week. 8 patch 12  . gabapentin (NEURONTIN) 600 MG tablet Take 2 tablets (1,200 mg total) by mouth 3 (three) times daily. 540 tablet 3  . hydrochlorothiazide (MICROZIDE) 12.5 MG capsule Take 1 capsule (12.5 mg total) by mouth daily. 90 capsule 3  . levothyroxine (SYNTHROID, LEVOTHROID) 50 MCG tablet Take 1 tablet (50 mcg total) by mouth daily before breakfast. 90 tablet 3  . lisinopril (PRINIVIL,ZESTRIL) 5 MG tablet Take 0.5 tablets (2.5 mg total) by mouth daily. 30 tablet 2  .  meloxicam (MOBIC) 15 MG tablet Take 1 tablet (15 mg total) by mouth daily as needed for pain. 90 tablet 3  . pantoprazole (PROTONIX) 40 MG tablet Take 1 tablet twice a day for 2 weeks then 1 tablet daily. 90 tablet 3  . sodium chloride (OCEAN) 0.65 % SOLN nasal spray Place 1 spray into both nostrils daily as needed for congestion.    Marland Kitchen tetrahydrozoline-zinc (VISINE-AC) 0.05-0.25 % ophthalmic solution Place 2 drops into both eyes 3 (three) times daily as needed (dry eyes).    . venlafaxine XR (EFFEXOR XR) 150 MG 24 hr capsule Take 1 capsule (150 mg total) by mouth daily with breakfast. 90 capsule 3   No current facility-administered medications on file prior to visit.    Review of Systems  Constitutional: Negative for other unusual diaphoresis or sweats HENT: Negative for ear discharge or swelling Eyes: Negative for other worsening visual disturbances Respiratory: Negative for stridor or other swelling  Gastrointestinal: Negative for worsening distension or other blood Genitourinary: Negative for retention or other urinary change Musculoskeletal: Negative for other MSK pain or swelling Skin: Negative for color change or other new lesions Neurological: Negative for worsening tremors and other numbness  Psychiatric/Behavioral: Negative for worsening agitation or other fatigue All other system neg per pt    Objective:   Physical Exam BP 138/88   Pulse 80   Ht 5\' 8"  (1.727 m)   Wt (!) 301 lb (136.5 kg)   LMP 11/02/2014   SpO2 100%   BMI 45.77 kg/m  VS noted,  Constitutional: Pt appears in NAD HENT: Head: NCAT.  Right Ear: External ear normal.  Left Ear: External ear normal.  Eyes: . Pupils are equal, round, and reactive to light. Conjunctivae and EOM are normal Nose: without d/c or deformity Neck: Neck supple. Gross normal ROM Cardiovascular: Normal rate and regular rhythm.   Pulmonary/Chest: Effort normal and breath sounds without rales or wheezing.  Abd:  Soft, NT, ND, + BS, no  organomegaly Neurological: Pt is alert. At baseline orientation, motor grossly intact Skin: Skin is warm. No rashes, other new lesions, no LE edema Psychiatric: Pt behavior is normal without agitation  Left shoudler with subacromial tender and ? Bicep insertion site tenderness Right knee with bony deg change, slight effusion, crepitus and decrsaed ROM Spine nontender ot midline + right lumbar paravertebral tender  Lab Results  Component Value Date   WBC 11.7 (H) 05/12/2016   HGB 13.5 05/12/2016   HCT 38.9 05/12/2016   PLT 277 05/12/2016   GLUCOSE 115 04/21/2016   ALT 25 04/21/2016   AST 19 04/21/2016   NA 139 04/21/2016   K 3.4 (L) 04/21/2016   CL 103 09/04/2015   CREATININE 0.8 04/21/2016   BUN 10.6 04/21/2016   CO2 25 04/21/2016   TSH 1.877 09/04/2015   INR 0.90 05/12/2016        Assessment &  Plan:

## 2016-11-01 NOTE — Assessment & Plan Note (Signed)
C/w msk strain, possibly I think related to gait difficulty given the left ankle and right knee issues, for flexeril prn,  to f/u any worsening symptoms or concerns

## 2016-11-01 NOTE — Assessment & Plan Note (Signed)
C/w ongoing chronic DJD, cont nsaid prn, tramadol for breakthrough, for referral sports medicine in this office

## 2016-11-03 ENCOUNTER — Encounter: Payer: Self-pay | Admitting: Internal Medicine

## 2016-11-04 ENCOUNTER — Ambulatory Visit (INDEPENDENT_AMBULATORY_CARE_PROVIDER_SITE_OTHER): Payer: 59 | Admitting: Podiatry

## 2016-11-04 ENCOUNTER — Other Ambulatory Visit: Payer: Self-pay

## 2016-11-04 ENCOUNTER — Encounter: Payer: Self-pay | Admitting: Podiatry

## 2016-11-04 DIAGNOSIS — M779 Enthesopathy, unspecified: Secondary | ICD-10-CM | POA: Diagnosis not present

## 2016-11-04 DIAGNOSIS — C562 Malignant neoplasm of left ovary: Principal | ICD-10-CM

## 2016-11-04 DIAGNOSIS — C561 Malignant neoplasm of right ovary: Secondary | ICD-10-CM

## 2016-11-04 DIAGNOSIS — C563 Malignant neoplasm of bilateral ovaries: Secondary | ICD-10-CM

## 2016-11-04 MED ORDER — TRAMADOL HCL 50 MG PO TABS: 50.0000 mg | ORAL_TABLET | Freq: Three times a day (TID) | ORAL | 1 refills | Status: DC | PRN

## 2016-11-04 NOTE — Addendum Note (Signed)
Addended by: Juliet Rude on: 11/04/2016 07:36 AM   Modules accepted: Orders

## 2016-11-04 NOTE — Telephone Encounter (Signed)
Done hardcopy to Shirron  

## 2016-11-04 NOTE — Addendum Note (Signed)
Addended by: Biagio Borg on: 11/04/2016 12:23 PM   Modules accepted: Orders

## 2016-11-07 ENCOUNTER — Other Ambulatory Visit: Payer: 59

## 2016-11-07 ENCOUNTER — Ambulatory Visit: Payer: 59 | Attending: Gynecologic Oncology | Admitting: Gynecologic Oncology

## 2016-11-07 ENCOUNTER — Encounter: Payer: Self-pay | Admitting: Gynecologic Oncology

## 2016-11-07 VITALS — BP 138/70 | HR 100 | Temp 98.7°F | Resp 20 | Wt 303.7 lb

## 2016-11-07 DIAGNOSIS — Z881 Allergy status to other antibiotic agents status: Secondary | ICD-10-CM | POA: Insufficient documentation

## 2016-11-07 DIAGNOSIS — C563 Malignant neoplasm of bilateral ovaries: Secondary | ICD-10-CM

## 2016-11-07 DIAGNOSIS — C561 Malignant neoplasm of right ovary: Secondary | ICD-10-CM

## 2016-11-07 DIAGNOSIS — Z823 Family history of stroke: Secondary | ICD-10-CM | POA: Insufficient documentation

## 2016-11-07 DIAGNOSIS — Z888 Allergy status to other drugs, medicaments and biological substances status: Secondary | ICD-10-CM | POA: Insufficient documentation

## 2016-11-07 DIAGNOSIS — C569 Malignant neoplasm of unspecified ovary: Secondary | ICD-10-CM | POA: Diagnosis present

## 2016-11-07 DIAGNOSIS — E079 Disorder of thyroid, unspecified: Secondary | ICD-10-CM | POA: Insufficient documentation

## 2016-11-07 DIAGNOSIS — C562 Malignant neoplasm of left ovary: Principal | ICD-10-CM

## 2016-11-07 DIAGNOSIS — Z8543 Personal history of malignant neoplasm of ovary: Secondary | ICD-10-CM

## 2016-11-07 DIAGNOSIS — Z9889 Other specified postprocedural states: Secondary | ICD-10-CM | POA: Insufficient documentation

## 2016-11-07 DIAGNOSIS — Z90722 Acquired absence of ovaries, bilateral: Secondary | ICD-10-CM | POA: Insufficient documentation

## 2016-11-07 DIAGNOSIS — Z882 Allergy status to sulfonamides status: Secondary | ICD-10-CM | POA: Diagnosis not present

## 2016-11-07 DIAGNOSIS — Z833 Family history of diabetes mellitus: Secondary | ICD-10-CM | POA: Diagnosis not present

## 2016-11-07 DIAGNOSIS — Z90710 Acquired absence of both cervix and uterus: Secondary | ICD-10-CM | POA: Diagnosis not present

## 2016-11-07 DIAGNOSIS — Z818 Family history of other mental and behavioral disorders: Secondary | ICD-10-CM | POA: Diagnosis not present

## 2016-11-07 DIAGNOSIS — Z9221 Personal history of antineoplastic chemotherapy: Secondary | ICD-10-CM

## 2016-11-07 DIAGNOSIS — Z7902 Long term (current) use of antithrombotics/antiplatelets: Secondary | ICD-10-CM | POA: Diagnosis not present

## 2016-11-07 DIAGNOSIS — Z8249 Family history of ischemic heart disease and other diseases of the circulatory system: Secondary | ICD-10-CM | POA: Diagnosis not present

## 2016-11-07 NOTE — Patient Instructions (Signed)
Please notify Dr Denman George at phone number [phone] if you notice vaginal bleeding, new pelvic or abdominal pains, bloating, feeling full easy, or a change in bladder or bowel function.   Please follow up with Dr Denman George as scheduled in 3 months.

## 2016-11-07 NOTE — Progress Notes (Signed)
Follow Up Note: Gyn-Onc  [name] 46 y.o. female  CC:  Chief Complaint  Patient presents with  . Ovarian Cancer   Assessment/Plan:   Sailor Hevia is a 46 y.o. 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 s/p adjuvant carboplatin/paclitaxel chemotherapy (completed November, 2016).  No evidence of recurrence on today's exam.  Recommend repeat CA 125 today.  Recommend follow-up in 3 months (3 monthly follow-up until November, 2018), followed by 6 monthly exam.  HPI:  [name] is a 46 year old female woman who presented to the [facility] Emergency department in July, 2016 with acute onset abdominal pain, dyspnea, and bilateral ovarian masses seen on CT. She was transferred to [facility] from [facility] for further evaluation and treatment. She reported a history of endometriosis and infertility but no other gynecologic history. Her last pap smear was multiple years prior to presentation and she denied a history of dysplasia along with abnormal uterine bleeding.  Preop Labs and Imaging:  CA 125: 554  CEA: 0.9  CA 19-9: 587  CT 11/09/14: IMPRESSION: 1. Large multiloculated partially cystic partially solid mass replacing the left ovary. The right ovary is also enlarged, heterogeneous, and abnormal in appearance with the inferior margin immediately abutting this mass lesion. Despite trace fat plane along the superior aspect of the right ovary and cystic/solid mass, the right ovary is markedly abnormal in appearance and certainly involved. These findings are concerning for a primary ovarian malignancy involving both ovaries. A pelvic ultrasound has been ordered the time of examination and may be useful in further evaluation of these pelvic masses. 2. The base of the appendix is slightly dilated and demonstrates hyperenhancement of the wall. Additionally, the lateral aspect of the base the appendix immediately abuts the right side of the pelvic mass. These findings may reflect  appendiceal involvement. 3. Minimal abutment of the sigmoid colon with relatively preserved fat plane throughout. No differential dilatation of the sigmoid colon or obvious tethering to suggest sigmoid involvement. However, no discrete fat plane is seen within a short segment as described above. 4. Trace ascites. No hydronephrosis or omental caking. 5. Minimal perihepatic free fluid as well less enlarged portacaval lymph node node. 6. Subtle posterior right hepatic dome hypodensity is too small to characterize. While this lesion does not have the typical appearance of a metastasis, this finding remains indeterminate on this suboptimally timed study. If clinically indicated, a MRI of the abdomen without and with contrast may be obtained for further evaluation.  On 11/10/14, she underwent an exploratory laparotomy and radical tumor debulking with lysis of adhesions for 90 minutes and bilateral ureterolysis for retroperitoneal fibrosis by Dr. Thurston Pounds at [facility].  Operative findings included:  1. Chocolate colored ascites immediately noted upon abdominal entry consistent with ruptured endometrioma  2. Bilateral ovarian masses with chocolate fluid, left side measuring ~12cm with ~8cm solid component, right side measuring ~6cm (frozen section of the left ovary showing invasive carcinoma concerning for clear cell)  3. 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 10 cm fibroid uterus with normal cervix to palpation  5. Normal abdominal survey with normal appearing omentum, normal appearing appendix retroperitoneal and running cephalad along the cecum, normal small and large bowel without evidence of cancer spread, smooth diaphragms bilaterally, smooth liver edge with hepatomegaly, normal stomach and normal kidneys on palpation  6. Sigmoid colon densely adherent to the uterus and posterior cervix precluding cervical excision  7.  No  pathologically enlarged lymph nodes but discolored shotty left obturator and aortic lymph nodes excised  8. R0 resection with no gross residual cancer at surgery close.  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 (please see synoptic report)  - Endometriosis  Left fallopian tube:  - Negative for involvement by clear cell carcinoma  B: Ovary and tube, right salpingo-oophorectomy  Right ovary:  - Ovarian clear cell carcinoma (please see synoptic report)  - Endometriosis  Right fallopian tube:  - Negative for involvement by clear cell carcinoma  - Endometriosis  C: Uterus, supracervical hysterectomy  - Negative for involvement by clear cell carcinoma  Additional findings:  Endometrium: - Weakly proliferative endometrium  - No hyperplasia or carcinoma identified  Myometrium:  - Extensive adenomyosis  D: Lymph node, aortic, regional resection  - One lymph node negative for metastatic carcinoma (0/1)  E: Lymph node, right pelvic, regional resection  - Four lymph nodes negative for metastatic carcinoma (0/4)  F: Lymph node, left pelvic, regional resection  - Two lymph nodes negative for metastatic carcinoma (0/2)  G: Omentum, omentectomy  - Negative for involvement by clear cell carcinoma  - Mature adipose tissue  H: Peritoneum, biopsy  - Negative for involvement by clear cell carcinoma  - Fibroadipose tissue  A: Pelvic washing  - No malignant cells identified (see comment)  B: Diaphragm, brushing/scraping  - No malignant cells identified  - Reactive mesothelial cells  Interval History:   She went on to receive 6 cycles of adjuvant carboplatin and paclitaxel chemotherapy completed in November, 2016.  CA 125 on 04/21/16 was stable at 25.2, 25.7 on 08/24/16. The patient presents today with her significant other for surveillance visit.  She is tolerating the vivelle dot much  better.  Review of Systems  Constitutional: Feels well.  No fever, chills, early satiety, unintentional weight loss or gain.  Appetite improving.  Cardiovascular: No chest pain, shortness of breath, or edema.  Pulmonary: No cough or wheeze.  Gastrointestinal: No nausea, vomiting, or diarrhea. No bright red blood per rectum or change in bowel movement.  Genitourinary: No frequency, urgency. No vaginal bleeding or discharge.  Musculoskeletal: No myalgia or joint pain. Neurologic: No weakness, numbness, or change in gait.  Psychology: No depression, anxiety, or insomnia.  Current Meds:  Outpatient Encounter Prescriptions as of 11/07/2016  Medication Sig  . ALPRAZolam (XANAX) 1 MG tablet Take 1 tablet (1 mg total) by mouth 2 (two) times daily as needed for anxiety. To fill Jul 12, 2016  . clobetasol (OLUX) 0.05 % topical foam Apply topically 2 (two) times daily.  . Cyanocobalamin (VITAMIN B-12 PO) Take 2 tablets by mouth daily.  . cyclobenzaprine (FLEXERIL) 5 MG tablet Take 1 tablet (5 mg total) by mouth 2 (two) times daily.  Marland Kitchen estradiol (VIVELLE-DOT) 0.05 MG/24HR patch Place 1 patch (0.05 mg total) onto the skin 2 (two) times a week.  . fluticasone (FLONASE) 50 MCG/ACT nasal spray Place 2 sprays into both nostrils daily.  Marland Kitchen gabapentin (NEURONTIN) 600 MG tablet Take 2 tablets (1,200 mg total) by mouth 3 (three) times daily.  . hydrochlorothiazide (MICROZIDE) 12.5 MG capsule Take 1 capsule (12.5 mg total) by mouth daily.  Marland Kitchen levothyroxine (SYNTHROID, LEVOTHROID) 50 MCG tablet Take 1 tablet (50 mcg total) by mouth daily before breakfast.  . lisinopril (PRINIVIL,ZESTRIL) 5 MG tablet Take 0.5 tablets (2.5 mg total) by mouth daily.  . meloxicam (MOBIC)  15 MG tablet Take 1 tablet (15 mg total) by mouth daily as needed for pain.  . montelukast (SINGULAIR) 10 MG tablet Take 1 tablet (10 mg total) by mouth at bedtime.  . pantoprazole (PROTONIX) 40 MG tablet Take 1 tablet twice a day for 2 weeks then 1  tablet daily.  . sodium chloride (OCEAN) 0.65 % SOLN nasal spray Place 1 spray into both nostrils daily as needed for congestion.  Marland Kitchen tetrahydrozoline-zinc (VISINE-AC) 0.05-0.25 % ophthalmic solution Place 2 drops into both eyes 3 (three) times daily as needed (dry eyes).  . traMADol (ULTRAM) 50 MG tablet Take 1 tablet (50 mg total) by mouth every 8 (eight) hours as needed.  . venlafaxine XR (EFFEXOR XR) 150 MG 24 hr capsule Take 1 capsule (150 mg total) by mouth daily with breakfast.   No facility-administered encounter medications on file as of 11/07/2016.     Allergy:  Allergies  Allergen Reactions  . Moxifloxacin Anaphylaxis    needs epinephrine shot  . Quinolones Anaphylaxis, Shortness Of Breath and Swelling  . Doxycycline Nausea And Vomiting  . Escitalopram Oxalate Other (See Comments)     fatigue  . Sulfonamide Derivatives Swelling and Rash    needs epinephrine shot--rash and lip swelling    Social Hx:   Social History   Social History  . Marital status: Divorced    Spouse name: N/A  . Number of children: 0  . Years of education: N/A   Occupational History  . Not on file.   Social History Main Topics  . Smoking status: Never Smoker  . Smokeless tobacco: Never Used  . Alcohol use 0.0 oz/week     Comment: Rarely  . Drug use: No  . Sexual activity: Not on file   Other Topics Concern  . Not on file   Social History Narrative   Lives with boyfriend in a one story home.  Has no children.     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     Education: college.    Past Surgical Hx:  Past Surgical History:  Procedure Laterality Date  . ABDOMINAL HYSTERECTOMY  11/10/14   at [facility], Exp lap, supracervical hyst, BSO, infracolic omentectomy, lymphadenectomy, aortic lymph node sampling  . IR GENERIC HISTORICAL  05/12/2016   IR REMOVAL TUN ACCESS W/ PORT W/O FL MOD SED 05/12/2016 [facility]    Past Medical Hx:  Past Medical History:  Diagnosis Date  . Anxiety   . Elevated blood pressure,  situational 05/05/2016  . Endometriosis   . Exercise-induced asthma 09/08/2015  . Ovarian cancer, bilateral ([place]) 11/28/2014  . PONV (postoperative nausea and vomiting)   . Thyroid disease     Family Hx:  Family History  Problem Relation Age of Onset  . Hypertension Mother   . Diabetes Mother   . Stroke Mother        Deceased  . Atrial fibrillation Father        Living  . Diabetes Maternal Aunt   . Heart failure Maternal Grandmother   . Heart attack Maternal Grandfather   . Dementia Paternal Grandmother   . Dementia Paternal Grandfather   . Healthy Brother     Vitals:  Blood pressure 138/70, pulse 100, temperature 98.7 F (37.1 C), resp. rate 20, weight (!) 303 lb 11.2 oz (137.8 kg), last menstrual period 11/02/2014, SpO2 100 %.  Physical Exam:  General: Well developed, well nourished female in no acute distress. Alert and oriented x 3.  Cardiovascular: Regular rate and  rhythm. S1 and S2 normal.  Lungs: Clear to auscultation bilaterally. No wheezes/crackles/rhonchi noted.  Skin: No rashes or lesions present. Back: No CVA tenderness.  Abdomen: Abdomen soft, non-tender and morbidly obese. Active bowel sounds in all quadrants. No masses Genitourinary: bimanual exam reveals a cervix with no pelvic masses. Cervix normal. Extremities: Edema noted bilaterally, slightly more in the left lower extremity but non-pitting.  No erythema or tenderness noted with the LLE.  Negative Homans sign with the LLE.  No bilateral cyanosis or clubbing.    Donaciano Eva, MD 11/07/2016, 2:36 PM

## 2016-11-08 ENCOUNTER — Ambulatory Visit: Payer: 59 | Admitting: Orthotics

## 2016-11-08 LAB — CA 125: CANCER ANTIGEN (CA) 125: 21.4 U/mL (ref 0.0–38.1)

## 2016-11-10 NOTE — Telephone Encounter (Signed)
The pt would like a call when this has been completed.

## 2016-11-10 NOTE — Telephone Encounter (Signed)
Was this done? The pt called saying that the pharmacy has not received the new prescription.

## 2016-11-14 NOTE — Progress Notes (Signed)
Subjective: 46 year old female presents the office today for follow up with vibration and continued pain to the office aspect left ankle. She states that the pain has gotten better. She did not do physical therapy. She feels that the ankle to be as good as is, get overall she has made improvement. She denies any swelling or redness the area. Incision remains healed she is not any drainage or openings that she has noticed. She started to get similar symptoms after cast of the right ankle as well but not as noticeable as the left. Denies any systemic complaints such as fevers, chills, nausea, vomiting. No acute changes since last appointment, and no other complaints at this time.   Objective: AAO x3, NAD DP/PT pulses palpable bilaterally, CRT less than 3 seconds Incision from the prior surgery is well-healed. There is tenderness along the sinus tarsi of the left foot. There is no area pinpoint tenderness pain vibratory sensation. This states edema is no erythema or increase in warmth. Also mild tenderness to the lateral aspect of the right frontal and sinus tarsi. No open lesions or pre-ulcerative lesions.  No pain with calf compression, swelling, warmth, erythema  Assessment: Since tarsi syndrome bilaterally  Plan: -All treatment options discussed with the patient including all alternatives, risks, complications.  -This point I discussed with her custom molded orthotics. She was measured orthotics. She's tried a brace which didn't help he also discussed the change in shoes as well as more supportive shoes. -Recommended physical therapy still. -Follow as scheduled or sooner if needed. -Patient encouraged to call the office with any questions, concerns, change in symptoms.   Celesta Gentile, DPM

## 2016-11-22 NOTE — Progress Notes (Deleted)
[facility] [address] [place], [place] [address] Phone: [phone] Subjective:    I'm seeing this patient by the request  of:    CC:   [id number]  [name] is a 46 y.o. female coming in with complaint of ***     Past Medical History:  Diagnosis Date  . Anxiety   . Elevated blood pressure, situational 05/05/2016  . Endometriosis   . Exercise-induced asthma 09/08/2015  . Ovarian cancer, bilateral ([facility]) 11/28/2014  . PONV (postoperative nausea and vomiting)   . Thyroid disease    Past Surgical History:  Procedure Laterality Date  . ABDOMINAL HYSTERECTOMY  11/10/14   at [facility], Exp lap, supracervical hyst, BSO, infracolic omentectomy, lymphadenectomy, aortic lymph node sampling  . IR GENERIC HISTORICAL  05/12/2016   IR REMOVAL TUN ACCESS W/ PORT W/O FL MOD SED 05/12/2016 [facility]   Social History   Social History  . Marital status: Divorced    Spouse name: N/A  . Number of children: 0  . Years of education: N/A   Social History Main Topics  . Smoking status: Never Smoker  . Smokeless tobacco: Never Used  . Alcohol use 0.0 oz/week     Comment: Rarely  . Drug use: No  . Sexual activity: Not on file   Other Topics Concern  . Not on file   Social History Narrative   Lives with boyfriend in a one story home.  Has no children.     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     Education: college.   Allergies  Allergen Reactions  . Moxifloxacin Anaphylaxis    needs epinephrine shot  . Quinolones Anaphylaxis, Shortness Of Breath and Swelling  . Doxycycline Nausea And Vomiting  . Escitalopram Oxalate Other (See Comments)     fatigue  . Sulfonamide Derivatives Swelling and Rash    needs epinephrine shot--rash and lip swelling   Family History  Problem Relation Age of Onset  . Hypertension Mother   . Diabetes Mother   . Stroke Mother        Deceased  . Atrial fibrillation Father        Living  . Diabetes Maternal Aunt   . Heart failure  Maternal Grandmother   . Heart attack Maternal Grandfather   . Dementia Paternal Grandmother   . Dementia Paternal Grandfather   . Healthy Brother     Past medical history, social, surgical and family history all reviewed in electronic medical record.  No pertanent information unless stated regarding to the chief complaint.   Review of Systems:Review of systems updated and as accurate as of 11/22/16  No headache, visual changes, nausea, vomiting, diarrhea, constipation, dizziness, abdominal pain, skin rash, fevers, chills, night sweats, weight loss, swollen lymph nodes, body aches, joint swelling, muscle aches, chest pain, shortness of breath, mood changes.   Objective  Last menstrual period 11/02/2014. Systems examined below as of 11/22/16   General: No apparent distress alert and oriented x3 mood and affect normal, dressed appropriately.  HEENT: Pupils equal, extraocular movements intact  Respiratory: Patient's speak in full sentences and does not appear short of breath  Cardiovascular: No lower extremity edema, non tender, no erythema  Skin: Warm dry intact with no signs of infection or rash on extremities or on axial skeleton.  Abdomen: Soft nontender  Neuro: Cranial nerves II through XII are intact, neurovascularly intact in all extremities with 2+ DTRs and 2+ pulses.  Lymph: No lymphadenopathy of posterior or  anterior cervical chain or axillae bilaterally.  Gait normal with good balance and coordination.  MSK:  Non tender with full range of motion and good stability and symmetric strength and tone of shoulders, elbows, wrist, hip, knee and ankles bilaterally.     Impression and Recommendations:     This case required medical decision making of moderate complexity.      Note: This dictation was prepared with Dragon dictation along with smaller phrase technology. Any transcriptional errors that result from this process are unintentional.

## 2016-11-23 ENCOUNTER — Ambulatory Visit: Payer: 59 | Admitting: Family Medicine

## 2017-01-21 ENCOUNTER — Other Ambulatory Visit: Payer: Self-pay | Admitting: Internal Medicine

## 2017-01-21 DIAGNOSIS — F419 Anxiety disorder, unspecified: Secondary | ICD-10-CM

## 2017-01-24 NOTE — Telephone Encounter (Signed)
faxed

## 2017-01-24 NOTE — Telephone Encounter (Signed)
Done hardcopy to Shirron  

## 2017-01-25 ENCOUNTER — Telehealth: Payer: Self-pay | Admitting: Internal Medicine

## 2017-01-25 NOTE — Telephone Encounter (Signed)
This pt called stating that she is currently in school at [employer] and has an exam scheduled for today at 2:00pm. She explained to me that she is a cancer survivor and one of her very close friends who has the same type of cancer was just moved over to Hospice at [facility]. She is very upset about this and does not feel like she will be able to take the exam today due to these circumstances and her history of anxiety. She was crying while she was on the phone with me. She spoke with her teacher about this and she told her that she would need a letter explaining the circumstance and stating that it is okay for her to take it at another time.  This can be emailed to her at [email or web address] She would also like a call back to let her know.

## 2017-01-25 NOTE — Telephone Encounter (Signed)
Ok for letter to state patient should be excused from test taking today due to illness, and to reschedule the test for 1 wk or after

## 2017-01-25 NOTE — Telephone Encounter (Signed)
I let the pt know. She said that she would pick up the letter while she was here for her visit tomorrow.

## 2017-01-26 ENCOUNTER — Encounter: Payer: Self-pay | Admitting: Internal Medicine

## 2017-01-26 ENCOUNTER — Ambulatory Visit (INDEPENDENT_AMBULATORY_CARE_PROVIDER_SITE_OTHER): Payer: 59 | Admitting: Internal Medicine

## 2017-01-26 VITALS — BP 136/88 | HR 95 | Temp 97.9°F | Ht 68.0 in | Wt 309.0 lb

## 2017-01-26 DIAGNOSIS — Z6841 Body Mass Index (BMI) 40.0 and over, adult: Secondary | ICD-10-CM | POA: Diagnosis not present

## 2017-01-26 DIAGNOSIS — F329 Major depressive disorder, single episode, unspecified: Secondary | ICD-10-CM | POA: Diagnosis not present

## 2017-01-26 DIAGNOSIS — F32A Depression, unspecified: Secondary | ICD-10-CM

## 2017-01-26 DIAGNOSIS — R41840 Attention and concentration deficit: Secondary | ICD-10-CM

## 2017-01-26 DIAGNOSIS — F419 Anxiety disorder, unspecified: Secondary | ICD-10-CM

## 2017-01-26 DIAGNOSIS — J309 Allergic rhinitis, unspecified: Secondary | ICD-10-CM | POA: Diagnosis not present

## 2017-01-26 MED ORDER — ESCITALOPRAM OXALATE 10 MG PO TABS: 10.0000 mg | ORAL_TABLET | Freq: Every day | ORAL | 3 refills | Status: DC

## 2017-01-26 MED ORDER — VENLAFAXINE HCL ER 75 MG PO CP24: 75.0000 mg | ORAL_CAPSULE | Freq: Every day | ORAL | 0 refills | Status: DC

## 2017-01-26 NOTE — Patient Instructions (Addendum)
To stop the effexor by taking the 75 mg dose for 2 wks and stop  Please take all new medication as prescribed - the lexapro  You will be contacted regarding the referral for: ADD clinic  [place] to take the OTC Zyrtec and Nasacort and Mucinex in addition to the singulair to help the ear and allergy symptoms  You are given the parking application.  Please continue all other medications as before, and refills have been done if requested.  Please keep your appointments with your specialists as you may have planned

## 2017-01-26 NOTE — Progress Notes (Signed)
Subjective:    Patient ID: [name], female    DOB: 21-Feb-1971, 46 y.o.   MRN: [id number]  HPI  Here after a friend has been moved to inpatient hospice who also had ovarian cancer, thinks she has survivor guilt like feelings, and several other friends with this have died as well, just cant get away from feeling she might die even though she realizes her condition is stable.  Hasa much increased stress, uanble to focus and memory issue, with poor concentration and comprehension so that she can read a paragraph and not rememeber what she just read. (which is new, but has been somewhat ongoing in the past).  Boyfriend has ADD and is here sort of pushing for tx. Also thinking effexor may need changed since not really controlling the nerves.  Has some tightness in jaw, shoulders, and insomnia as well.  Has chronic left ankle pain, needs parking applciation renewed.f also Does have several wks ongoing nasal allergy symptoms with clearish congestion, itch and sneezing, without fever, pain, ST, cough, swelling or wheezing. Past Medical History:  Diagnosis Date  . Anxiety   . Elevated blood pressure, situational 05/05/2016  . Endometriosis   . Exercise-induced asthma 09/08/2015  . Ovarian cancer, bilateral ([place]) 11/28/2014  . PONV (postoperative nausea and vomiting)   . Thyroid disease    Past Surgical History:  Procedure Laterality Date  . ABDOMINAL HYSTERECTOMY  11/10/14   at [facility], Exp lap, supracervical hyst, BSO, infracolic omentectomy, lymphadenectomy, aortic lymph node sampling  . IR GENERIC HISTORICAL  05/12/2016   IR REMOVAL TUN ACCESS W/ PORT W/O FL MOD SED 05/12/2016 [facility]    reports that she has never smoked. She has never used smokeless tobacco. She reports that she drinks alcohol. She reports that she does not use drugs. family history includes Atrial fibrillation in her father; Dementia in her paternal grandfather and paternal grandmother; Diabetes in her maternal aunt and  mother; Healthy in her brother; Heart attack in her maternal grandfather; Heart failure in her maternal grandmother; Hypertension in her mother; Stroke in her mother. Allergies  Allergen Reactions  . Moxifloxacin Anaphylaxis    needs epinephrine shot  . Quinolones Anaphylaxis, Shortness Of Breath and Swelling  . Doxycycline Nausea And Vomiting  . Escitalopram Oxalate Other (See Comments)     fatigue  . Sulfonamide Derivatives Swelling and Rash    needs epinephrine shot--rash and lip swelling   Current Outpatient Prescriptions on File Prior to Visit  Medication Sig Dispense Refill  . ALPRAZolam (XANAX) 1 MG tablet TAKE 1 TABLET BY MOUTH TWICE DAILY AS NEEDED FOR ANXIETY 60 tablet 5  . clobetasol (OLUX) 0.05 % topical foam Apply topically 2 (two) times daily. 50 g 1  . Cyanocobalamin (VITAMIN B-12 PO) Take 2 tablets by mouth daily.    . cyclobenzaprine (FLEXERIL) 5 MG tablet Take 1 tablet (5 mg total) by mouth 2 (two) times daily. 60 tablet 5  . estradiol (VIVELLE-DOT) 0.05 MG/24HR patch Place 1 patch (0.05 mg total) onto the skin 2 (two) times a week. 8 patch 12  . fluticasone (FLONASE) 50 MCG/ACT nasal spray Place 2 sprays into both nostrils daily. 16 g 5  . gabapentin (NEURONTIN) 600 MG tablet Take 2 tablets (1,200 mg total) by mouth 3 (three) times daily. 540 tablet 3  . hydrochlorothiazide (MICROZIDE) 12.5 MG capsule Take 1 capsule (12.5 mg total) by mouth daily. 90 capsule 3  . levothyroxine (SYNTHROID, LEVOTHROID) 50 MCG tablet Take 1 tablet (50 mcg  total) by mouth daily before breakfast. 90 tablet 3  . lisinopril (PRINIVIL,ZESTRIL) 5 MG tablet Take 0.5 tablets (2.5 mg total) by mouth daily. 30 tablet 2  . meloxicam (MOBIC) 15 MG tablet Take 1 tablet (15 mg total) by mouth daily as needed for pain. 90 tablet 3  . montelukast (SINGULAIR) 10 MG tablet Take 1 tablet (10 mg total) by mouth at bedtime. 90 tablet 3  . pantoprazole (PROTONIX) 40 MG tablet Take 1 tablet twice a day for 2 weeks  then 1 tablet daily. 90 tablet 3  . sodium chloride (OCEAN) 0.65 % SOLN nasal spray Place 1 spray into both nostrils daily as needed for congestion.    Marland Kitchen tetrahydrozoline-zinc (VISINE-AC) 0.05-0.25 % ophthalmic solution Place 2 drops into both eyes 3 (three) times daily as needed (dry eyes).    . traMADol (ULTRAM) 50 MG tablet Take 1 tablet (50 mg total) by mouth every 8 (eight) hours as needed. 60 tablet 1   No current facility-administered medications on file prior to visit.    Review of Systems  Constitutional: Negative for other unusual diaphoresis or sweats HENT: Negative for ear discharge or swelling Eyes: Negative for other worsening visual disturbances Respiratory: Negative for stridor or other swelling  Gastrointestinal: Negative for worsening distension or other blood Genitourinary: Negative for retention or other urinary change Musculoskeletal: Negative for other MSK pain or swelling Skin: Negative for color change or other new lesions Neurological: Negative for worsening tremors and other numbness  Psychiatric/Behavioral: Negative for worsening agitation or other fatigue All other system neg per pt    Objective:   Physical Exam BP 136/88   Pulse 95   Temp 97.9 F (36.6 C) (Oral)   Ht 5\' 8"  (1.727 m)   Wt (!) 309 lb (140.2 kg)   LMP 11/02/2014   SpO2 98%   BMI 46.98 kg/m  VS noted, supermorbid obese Constitutional: Pt appears in NAD HENT: Head: NCAT.  Right Ear: External ear normal.  Left Ear: External ear normal.  Eyes: . Pupils are equal, round, and reactive to light. Conjunctivae and EOM are normal Nose: without d/c or deformity Bilat tm's with mild erythema.  Max sinus areas non tender.  Pharynx with mild erythema, no exudate Neck: Neck supple. Gross normal ROM Cardiovascular: Normal rate and regular rhythm.   Pulmonary/Chest: Effort normal and breath sounds without rales or wheezing.  Neurological: Pt is alert. At baseline orientation, motor grossly  intact Skin: Skin is warm. No rashes, other new lesions, no LE edema Psychiatric: Pt behavior is normal without agitation , 1-2+ depressed nervous affect All other exam findings    Assessment & Plan:

## 2017-01-28 NOTE — Assessment & Plan Note (Signed)
I suspect related to anxiety, but will refer ADD clinic, as boyfriend with her has ADD and pushing for this and she agrees

## 2017-01-28 NOTE — Assessment & Plan Note (Addendum)
To cont xanax prn, declines counseling referral  Note:  Total time for pt hx, exam, review of record with pt in the room, determination of diagnoses and plan for further eval and tx is > 40 min, with over 50% spent in coordination and counseling of patient, including the differential dx, tx, further evaluation and other management of anxiety, depression, concentration difficulty, morbid obesity and allergic rhinitis

## 2017-01-28 NOTE — Assessment & Plan Note (Signed)
Mild to mod, for otc zyrtec and nasacort asd,  to f/u any worsening symptoms or concerns

## 2017-01-28 NOTE — Assessment & Plan Note (Signed)
Ok for nutrition referral as requested,  to f/u any worsening symptoms or concerns

## 2017-01-28 NOTE — Assessment & Plan Note (Signed)
Denies SI or HI, for change effexor to lexapro 10 qd, consider referral psychiatry

## 2017-01-30 ENCOUNTER — Encounter: Payer: Self-pay | Admitting: Gynecologic Oncology

## 2017-01-30 ENCOUNTER — Ambulatory Visit (HOSPITAL_BASED_OUTPATIENT_CLINIC_OR_DEPARTMENT_OTHER): Payer: 59

## 2017-01-30 ENCOUNTER — Ambulatory Visit: Payer: 59 | Attending: Gynecologic Oncology | Admitting: Gynecologic Oncology

## 2017-01-30 VITALS — BP 129/73 | HR 99 | Temp 98.8°F | Resp 18 | Wt 312.9 lb

## 2017-01-30 DIAGNOSIS — Z8543 Personal history of malignant neoplasm of ovary: Secondary | ICD-10-CM

## 2017-01-30 DIAGNOSIS — C569 Malignant neoplasm of unspecified ovary: Secondary | ICD-10-CM

## 2017-01-30 DIAGNOSIS — E079 Disorder of thyroid, unspecified: Secondary | ICD-10-CM | POA: Insufficient documentation

## 2017-01-30 DIAGNOSIS — R4189 Other symptoms and signs involving cognitive functions and awareness: Secondary | ICD-10-CM

## 2017-01-30 DIAGNOSIS — F419 Anxiety disorder, unspecified: Secondary | ICD-10-CM | POA: Insufficient documentation

## 2017-01-30 DIAGNOSIS — Z87898 Personal history of other specified conditions: Secondary | ICD-10-CM | POA: Insufficient documentation

## 2017-01-30 DIAGNOSIS — Z9221 Personal history of antineoplastic chemotherapy: Secondary | ICD-10-CM | POA: Diagnosis not present

## 2017-01-30 DIAGNOSIS — Z08 Encounter for follow-up examination after completed treatment for malignant neoplasm: Secondary | ICD-10-CM | POA: Diagnosis not present

## 2017-01-30 MED ORDER — AMPHETAMINE-DEXTROAMPHETAMINE 5 MG PO TABS: 5.0000 mg | ORAL_TABLET | Freq: Two times a day (BID) | ORAL | 0 refills | Status: DC

## 2017-01-30 NOTE — Progress Notes (Signed)
Follow Up Note: Gyn-Onc  [name] 46 y.o. female  CC:  Chief Complaint  Patient presents with  . Ovarian cancer, unspecified laterality ([place])   Assessment/Plan:   [name] is a 46 y.o. 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 s/p adjuvant carboplatin/paclitaxel chemotherapy (completed November, 2016).  No evidence of recurrence on today's exam.  Recommend repeat CA 125 today.  Recommend follow-up in 6 months. Will continue these until November, 2021.  Adderall 5mg  BID with meals prescribed for ADHD/chemo brain symptoms.  HPI:  [name] is a 46 year old female woman who presented to the [facility] Emergency department in July, 2016 with acute onset abdominal pain, dyspnea, and bilateral ovarian masses seen on CT. She was transferred to [facility] from [facility] for further evaluation and treatment. She reported a history of endometriosis and infertility but no other gynecologic history. Her last pap smear was multiple years prior to presentation and she denied a history of dysplasia along with abnormal uterine bleeding.  Preop Labs and Imaging:  CA 125: 554  CEA: 0.9  CA 19-9: 587  CT 11/09/14: IMPRESSION: 1. Large multiloculated partially cystic partially solid mass replacing the left ovary. The right ovary is also enlarged, heterogeneous, and abnormal in appearance with the inferior margin immediately abutting this mass lesion. Despite trace fat plane along the superior aspect of the right ovary and cystic/solid mass, the right ovary is markedly abnormal in appearance and certainly involved. These findings are concerning for a primary ovarian malignancy involving both ovaries. A pelvic ultrasound has been ordered the time of examination and may be useful in further evaluation of these pelvic masses. 2. The base of the appendix is slightly dilated and demonstrates hyperenhancement of the wall. Additionally, the lateral aspect of the base the appendix immediately  abuts the right side of the pelvic mass. These findings may reflect appendiceal involvement. 3. Minimal abutment of the sigmoid colon with relatively preserved fat plane throughout. No differential dilatation of the sigmoid colon or obvious tethering to suggest sigmoid involvement. However, no discrete fat plane is seen within a short segment as described above. 4. Trace ascites. No hydronephrosis or omental caking. 5. Minimal perihepatic free fluid as well less enlarged portacaval lymph node node. 6. Subtle posterior right hepatic dome hypodensity is too small to characterize. While this lesion does not have the typical appearance of a metastasis, this finding remains indeterminate on this suboptimally timed study. If clinically indicated, a MRI of the abdomen without and with contrast may be obtained for further evaluation.  On 11/10/14, she underwent an exploratory laparotomy and radical tumor debulking with lysis of adhesions for 90 minutes and bilateral ureterolysis for retroperitoneal fibrosis by Dr. Thurston Pounds at [facility].  Operative findings included:  1. Chocolate colored ascites immediately noted upon abdominal entry consistent with ruptured endometrioma  2. Bilateral ovarian masses with chocolate fluid, left side measuring ~12cm with ~8cm solid component, right side measuring ~6cm (frozen section of the left ovary showing invasive carcinoma concerning for clear cell)  3. 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 10 cm fibroid uterus with normal cervix to palpation  5. Normal abdominal survey with normal appearing omentum, normal appearing appendix retroperitoneal and running cephalad along the cecum, normal small and large bowel without evidence of cancer spread, smooth diaphragms bilaterally, smooth liver edge with hepatomegaly, normal stomach and normal kidneys on palpation  6. Sigmoid colon densely adherent to the  uterus and posterior cervix precluding cervical excision  7. No pathologically enlarged lymph nodes but discolored shotty left obturator and aortic lymph nodes excised  8. R0 resection with no gross residual cancer at surgery close.  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 (please see synoptic report)  - Endometriosis  Left fallopian tube:  - Negative for involvement by clear cell carcinoma  B: Ovary and tube, right salpingo-oophorectomy  Right ovary:  - Ovarian clear cell carcinoma (please see synoptic report)  - Endometriosis  Right fallopian tube:  - Negative for involvement by clear cell carcinoma  - Endometriosis  C: Uterus, supracervical hysterectomy  - Negative for involvement by clear cell carcinoma  Additional findings:  Endometrium: - Weakly proliferative endometrium  - No hyperplasia or carcinoma identified  Myometrium:  - Extensive adenomyosis  D: Lymph node, aortic, regional resection  - One lymph node negative for metastatic carcinoma (0/1)  E: Lymph node, right pelvic, regional resection  - Four lymph nodes negative for metastatic carcinoma (0/4)  F: Lymph node, left pelvic, regional resection  - Two lymph nodes negative for metastatic carcinoma (0/2)  G: Omentum, omentectomy  - Negative for involvement by clear cell carcinoma  - Mature adipose tissue  H: Peritoneum, biopsy  - Negative for involvement by clear cell carcinoma  - Fibroadipose tissue  A: Pelvic washing  - No malignant cells identified (see comment)  B: Diaphragm, brushing/scraping  - No malignant cells identified  - Reactive mesothelial cells  Interval History:   She went on to receive 6 cycles of adjuvant carboplatin and paclitaxel chemotherapy completed in November, 2016.  CA 125 stable at 21.4 in July, 2018 The patient presents today with her significant other for surveillance visit.   She is tolerating the vivelle dot much better.   Chemo brain is very bothersome and making it hard for her to study in school. She cannot focus on work.  Review of Systems  Constitutional: Feels well.  No fever, chills, early satiety, unintentional weight loss or gain.  Appetite improving.  Cardiovascular: No chest pain, shortness of breath, or edema.  Pulmonary: No cough or wheeze.  Gastrointestinal: No nausea, vomiting, or diarrhea. No bright red blood per rectum or change in bowel movement.  Genitourinary: No frequency, urgency. No vaginal bleeding or discharge.  Musculoskeletal: No myalgia or joint pain. Neurologic: No weakness, numbness, or change in gait.  Psychology: No depression, anxiety, or insomnia.  Current Meds:  Outpatient Encounter Prescriptions as of 01/30/2017  Medication Sig  . ALPRAZolam (XANAX) 1 MG tablet TAKE 1 TABLET BY MOUTH TWICE DAILY AS NEEDED FOR ANXIETY  . Cyanocobalamin (VITAMIN B-12 PO) Take 2 tablets by mouth daily.  . cyclobenzaprine (FLEXERIL) 5 MG tablet Take 1 tablet (5 mg total) by mouth 2 (two) times daily.  Marland Kitchen escitalopram (LEXAPRO) 10 MG tablet Take 1 tablet (10 mg total) by mouth daily.  Marland Kitchen estradiol (VIVELLE-DOT) 0.05 MG/24HR patch Place 1 patch (0.05 mg total) onto the skin 2 (two) times a week.  . fluticasone (FLONASE) 50 MCG/ACT nasal spray Place 2 sprays into both nostrils daily.  Marland Kitchen gabapentin (NEURONTIN) 600 MG tablet Take 2 tablets (1,200 mg total) by mouth 3 (three) times daily.  Marland Kitchen levothyroxine (SYNTHROID, LEVOTHROID) 50 MCG tablet Take 1 tablet (50 mcg total) by mouth daily before breakfast.  . lisinopril (PRINIVIL,ZESTRIL) 5 MG tablet Take 0.5 tablets (2.5 mg total) by mouth daily.  Marland Kitchen  meloxicam (MOBIC) 15 MG tablet Take 1 tablet (15 mg total) by mouth daily as needed for pain.  . montelukast (SINGULAIR) 10 MG tablet Take 1 tablet (10 mg total) by mouth at bedtime.  . pantoprazole (PROTONIX) 40 MG tablet Take 1 tablet twice a day for 2  weeks then 1 tablet daily.  . sodium chloride (OCEAN) 0.65 % SOLN nasal spray Place 1 spray into both nostrils daily as needed for congestion.  Marland Kitchen tetrahydrozoline-zinc (VISINE-AC) 0.05-0.25 % ophthalmic solution Place 2 drops into both eyes 3 (three) times daily as needed (dry eyes).  . traMADol (ULTRAM) 50 MG tablet Take 1 tablet (50 mg total) by mouth every 8 (eight) hours as needed.  . venlafaxine XR (EFFEXOR XR) 75 MG 24 hr capsule Take 1 capsule (75 mg total) by mouth daily with breakfast.  . [DISCONTINUED] clobetasol (OLUX) 0.05 % topical foam Apply topically 2 (two) times daily.  Marland Kitchen amphetamine-dextroamphetamine (ADDERALL) 5 MG tablet Take 1 tablet (5 mg total) by mouth 2 (two) times daily with a meal.  . [DISCONTINUED] hydrochlorothiazide (MICROZIDE) 12.5 MG capsule Take 1 capsule (12.5 mg total) by mouth daily.   No facility-administered encounter medications on file as of 01/30/2017.     Allergy:  Allergies  Allergen Reactions  . Moxifloxacin Anaphylaxis    needs epinephrine shot  . Quinolones Anaphylaxis, Shortness Of Breath and Swelling  . Doxycycline Nausea And Vomiting  . Escitalopram Oxalate Other (See Comments)     fatigue  . Sulfonamide Derivatives Swelling and Rash    needs epinephrine shot--rash and lip swelling    Social Hx:   Social History   Social History  . Marital status: Divorced    Spouse name: N/A  . Number of children: 0  . Years of education: N/A   Occupational History  . Not on file.   Social History Main Topics  . Smoking status: Never Smoker  . Smokeless tobacco: Never Used  . Alcohol use 0.0 oz/week     Comment: Rarely  . Drug use: No  . Sexual activity: Not on file   Other Topics Concern  . Not on file   Social History Narrative   Lives with boyfriend in a one story home.  Has no children.     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     Education: college.    Past Surgical Hx:  Past Surgical History:  Procedure Laterality Date  . ABDOMINAL  HYSTERECTOMY  11/10/14   at [facility], Exp lap, supracervical hyst, BSO, infracolic omentectomy, lymphadenectomy, aortic lymph node sampling  . IR GENERIC HISTORICAL  05/12/2016   IR REMOVAL TUN ACCESS W/ PORT W/O FL MOD SED 05/12/2016 [facility]    Past Medical Hx:  Past Medical History:  Diagnosis Date  . Anxiety   . Elevated blood pressure, situational 05/05/2016  . Endometriosis   . Exercise-induced asthma 09/08/2015  . Ovarian cancer, bilateral ([place]) 11/28/2014  . PONV (postoperative nausea and vomiting)   . Thyroid disease     Family Hx:  Family History  Problem Relation Age of Onset  . Hypertension Mother   . Diabetes Mother   . Stroke Mother        Deceased  . Atrial fibrillation Father        Living  . Diabetes Maternal Aunt   . Heart failure Maternal Grandmother   . Heart attack Maternal Grandfather   . Dementia Paternal Grandmother   . Dementia Paternal Grandfather   . Healthy Brother  Vitals:  Blood pressure 129/73, pulse 99, temperature 98.8 F (37.1 C), resp. rate 18, weight (!) 312 lb 14.4 oz (141.9 kg), last menstrual period 11/02/2014, SpO2 100 %.  Physical Exam:  General: Well developed, well nourished female in no acute distress. Alert and oriented x 3.  Cardiovascular: Regular rate and rhythm. S1 and S2 normal.  Lungs: Clear to auscultation bilaterally. No wheezes/crackles/rhonchi noted.  Skin: No rashes or lesions present. Back: No CVA tenderness.  Abdomen: Abdomen soft, non-tender and morbidly obese. Active bowel sounds in all quadrants. No masses Genitourinary: bimanual exam reveals a cervix with no pelvic masses. Cervix normal. Extremities: Edema noted bilaterally, slightly more in the left lower extremity but non-pitting.  No erythema or tenderness noted with the LLE.  Negative Homans sign with the LLE.  No bilateral cyanosis or clubbing.    Donaciano Eva, MD 01/30/2017, 3:01 PM

## 2017-01-30 NOTE — Patient Instructions (Signed)
Please follow up in six months or sooner if needed.  Please call in Jan 2019 to schedule your appointment.  We will release your CA 125 results in Mychart.  Amphetamine; Dextroamphetamine tablets What is this medicine? AMPHETAMINE; DEXTROAMPHETAMINE(am FET a meen; dex troe am FET a meen) is used to treat attention-deficit hyperactivity disorder (ADHD). It may also be used for narcolepsy. Federal law prohibits giving this medicine to any person other than the person for whom it was prescribed. Do not share this medicine with anyone else. This medicine may be used for other purposes; ask your health care provider or pharmacist if you have questions. COMMON BRAND NAME(S): Adderall What should I tell my health care provider before I take this medicine? They need to know if you have any of these conditions: -anxiety or panic attacks -circulation problems in fingers and toes -glaucoma -hardening or blockages of the arteries or heart blood vessels -heart disease or a heart defect -high blood pressure -history of a drug or alcohol abuse problem -history of stroke -kidney disease -liver disease -mental illness -seizures -suicidal thoughts, plans, or attempt; a previous suicide attempt by you or a family member -thyroid disease -Tourette's syndrome -an unusual or allergic reaction to dextroamphetamine, other amphetamines, other medicines, foods, dyes, or preservatives -pregnant or trying to get pregnant -breast-feeding How should I use this medicine? Take this medicine by mouth with a glass of water. Follow the directions on the prescription label. Take your doses at regular intervals. Do not take your medicine more often than directed. Do not suddenly stop your medicine. You must gradually reduce the dose or you may feel withdrawal effects. Ask your doctor or health care professional for advice. Talk to your pediatrician regarding the use of this medicine in children. Special care may be needed.  While this drug may be prescribed for children as young as 3 years for selected conditions, precautions do apply. Overdosage: If you think you have taken too much of this medicine contact a poison control center or emergency room at once. NOTE: This medicine is only for you. Do not share this medicine with others. What if I miss a dose? If you miss a dose, take it as soon as you can. If it is almost time for your next dose, take only that dose. Do not take double or extra doses. What may interact with this medicine? Do not take this medicine with any of the following medications: -MAOIS like Carbex, Eldepryl, Marplan, Nardil, and Parnate -other stimulant medicines for attention disorders, weight loss, or to stay awake This medicine may also interact with the following medications: -acetazolamide -ammonium chloride -antacids -ascorbic acid -atomoxetine -caffeine -certain medicines for blood pressure -certain medicines for depression, anxiety, or psychotic disturbances -certain medicines for seizures like carbamazepine, phenobarbital, phenytoin -certain medicines for stomach problems like cimetidine, famotidine, omeprazole, lansoprazole -cold or allergy medicines -glutamic acid -lithium -meperidine -methenamine; sodium acid phosphate -narcotic medicines for pain -norepinephrine -phenothiazines like chlorpromazine, mesoridazine, prochlorperazine, thioridazine -sodium acid phosphate -sodium bicarbonate This list may not describe all possible interactions. Give your health care provider a list of all the medicines, herbs, non-prescription drugs, or dietary supplements you use. Also tell them if you smoke, drink alcohol, or use illegal drugs. Some items may interact with your medicine. What should I watch for while using this medicine? Visit your doctor or health care professional for regular checks on your progress. This prescription requires that you follow special procedures with your  doctor and pharmacy. You will need  to have a new written prescription from your doctor every time you need a refill. This medicine may affect your concentration, or hide signs of tiredness. Until you know how this medicine affects you, do not drive, ride a bicycle, use machinery, or do anything that needs mental alertness. Tell your doctor or health care professional if this medicine loses its effects, or if you feel you need to take more than the prescribed amount. Do not change the dosage without talking to your doctor or health care professional. Decreased appetite is a common side effect when starting this medicine. Eating small, frequent meals or snacks can help. Talk to your doctor if you continue to have poor eating habits. Height and weight growth of a child taking this medicine will be monitored closely. Do not take this medicine close to bedtime. It may prevent you from sleeping. If you are going to need surgery, a MRI, CT scan, or other procedure, tell your doctor that you are taking this medicine. You may need to stop taking this medicine before the procedure. Tell your doctor or healthcare professional right away if you notice unexplained wounds on your fingers and toes while taking this medicine. You should also tell your healthcare provider if you experience numbness or pain, changes in the skin color, or sensitivity to temperature in your fingers or toes. What side effects may I notice from receiving this medicine? Side effects that you should report to your doctor or health care professional as soon as possible: -allergic reactions like skin rash, itching or hives, swelling of the face, lips, or tongue -changes in vision -chest pain or chest tightness -confusion, trouble speaking or understanding -fast, irregular heartbeat -fingers or toes feel numb, cool, painful -hallucination, loss of contact with reality -high blood pressure -males: prolonged or painful  erection -seizures -severe headaches -shortness of breath -suicidal thoughts or other mood changes -trouble walking, dizziness, loss of balance or coordination -uncontrollable head, mouth, neck, arm, or leg movements Side effects that usually do not require medical attention (report to your doctor or health care professional if they continue or are bothersome): -anxious -headache -loss of appetite -nausea, vomiting -trouble sleeping -weight loss This list may not describe all possible side effects. Call your doctor for medical advice about side effects. You may report side effects to FDA at 1-800-FDA-1088. Where should I keep my medicine? Keep out of the reach of children. This medicine can be abused. Keep your medicine in a safe place to protect it from theft. Do not share this medicine with anyone. Selling or giving away this medicine is dangerous and against the law. Store at room temperature between 15 and 30 degrees C (59 and 86 degrees F). Keep container tightly closed. Throw away any unused medicine after the expiration date. Dispose of properly. This medicine may cause accidental overdose and death if it is taken by other adults, children, or pets. Mix any unused medicine with a substance like cat litter or coffee grounds. Then throw the medicine away in a sealed container like a sealed bag or a coffee can with a lid. Do not use the medicine after the expiration date. NOTE: This sheet is a summary. It may not cover all possible information. If you have questions about this medicine, talk to your doctor, pharmacist, or health care provider.  2018 Elsevier/Gold Standard (2014-02-26 18:44:41)

## 2017-01-31 LAB — CA 125: CANCER ANTIGEN (CA) 125: 24.3 U/mL (ref 0.0–38.1)

## 2017-02-01 ENCOUNTER — Telehealth: Payer: Self-pay

## 2017-02-01 NOTE — Telephone Encounter (Signed)
-----   Message from Everitt Amber, MD sent at 01/31/2017  9:37 AM EDT ----- Please let her know that this is a normal result and stable Thanks

## 2017-02-01 NOTE — Telephone Encounter (Signed)
Told Ms [name] the result of her CA-125 was 24.3 witch is good and in normal range as noted below by Dr. Denman George.

## 2017-02-03 ENCOUNTER — Encounter: Payer: Self-pay | Admitting: Gynecologic Oncology

## 2017-02-03 NOTE — Progress Notes (Signed)
Prior auth performed for Adderall and approved for one year.  Faxed to [facility]

## 2017-02-24 ENCOUNTER — Ambulatory Visit (INDEPENDENT_AMBULATORY_CARE_PROVIDER_SITE_OTHER): Payer: 59 | Admitting: Family Medicine

## 2017-02-24 ENCOUNTER — Encounter: Payer: Self-pay | Admitting: Family Medicine

## 2017-02-24 VITALS — BP 136/82 | HR 88 | Temp 98.9°F | Ht 68.0 in

## 2017-02-24 DIAGNOSIS — J0110 Acute frontal sinusitis, unspecified: Secondary | ICD-10-CM | POA: Diagnosis not present

## 2017-02-24 MED ORDER — AMOXICILLIN-POT CLAVULANATE 875-125 MG PO TABS: 1.0000 | ORAL_TABLET | Freq: Two times a day (BID) | ORAL | 0 refills | Status: DC

## 2017-02-24 NOTE — Progress Notes (Signed)
[name] - 46 y.o. female MRN [id number]  Date of birth: 11-Sep-1970  SUBJECTIVE:  Including CC & ROS.  Chief Complaint  Patient presents with  . Cough    Patient states she has a dry cough for a couple of weeks, worse at night. Increased ear pressure and sinus pain. She states she has been taking tylenol cold medicine.    [name] is a 46 year old female is presenting with cough. Reports this is been ongoing for a couple of weeks. Her symptoms seem to be worse at night. Having sinus pressure and pain. Has not had any documented fever but has had hot flashes and chills. Has not had any sick contacts. She felt that she improved and had gotten sick again this past week. Has tried several over-the-counter medications. Has not had any recent travel. She does have allergies. Has had some teeth pain.     Review of Systems  Constitutional: Positive for chills.  HENT: Positive for sinus pressure.   Gastrointestinal: Negative for vomiting.    HISTORY: Past Medical, Surgical, Social, and Family History Reviewed & Updated per EMR.   Pertinent Historical Findings include:  Past Medical History:  Diagnosis Date  . Anxiety   . Elevated blood pressure, situational 05/05/2016  . Endometriosis   . Exercise-induced asthma 09/08/2015  . Ovarian cancer, bilateral ([place]) 11/28/2014  . PONV (postoperative nausea and vomiting)   . Thyroid disease     Past Surgical History:  Procedure Laterality Date  . ABDOMINAL HYSTERECTOMY  11/10/14   at [facility], Exp lap, supracervical hyst, BSO, infracolic omentectomy, lymphadenectomy, aortic lymph node sampling  . IR GENERIC HISTORICAL  05/12/2016   IR REMOVAL TUN ACCESS W/ PORT W/O FL MOD SED 05/12/2016 [facility]    Allergies  Allergen Reactions  . Moxifloxacin Anaphylaxis    needs epinephrine shot  . Quinolones Anaphylaxis, Shortness Of Breath and Swelling  . Doxycycline Nausea And Vomiting  . Escitalopram Oxalate Other (See Comments)     fatigue  .  Sulfonamide Derivatives Swelling and Rash    needs epinephrine shot--rash and lip swelling    Family History  Problem Relation Age of Onset  . Hypertension Mother   . Diabetes Mother   . Stroke Mother        Deceased  . Atrial fibrillation Father        Living  . Diabetes Maternal Aunt   . Heart failure Maternal Grandmother   . Heart attack Maternal Grandfather   . Dementia Paternal Grandmother   . Dementia Paternal Grandfather   . Healthy Brother      Social History   Social History  . Marital status: Divorced    Spouse name: N/A  . Number of children: 0  . Years of education: N/A   Occupational History  . Not on file.   Social History Main Topics  . Smoking status: Never Smoker  . Smokeless tobacco: Never Used  . Alcohol use 0.0 oz/week     Comment: Rarely  . Drug use: No  . Sexual activity: Not on file   Other Topics Concern  . Not on file   Social History Narrative   Lives with boyfriend in a one story home.  Has no children.     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     Education: college.     PHYSICAL EXAM:  VS: BP 136/82 (BP Location: Left Arm, Patient Position: Sitting, Cuff Size: Large)   Pulse 88   Temp 98.9 F (37.2  C) (Oral)   Ht 5\' 8"  (1.727 m)   LMP 11/02/2014   SpO2 99%  Physical Exam Gen: NAD, alert, cooperative with exam,  ENT: normal lips, normal nasal mucosa, normal tympanic membranes bilaterally, normal oropharynx, no tonsillar exudates, tenderness to palpation of the left sided frontal and maxillary sinus, no cervical lymphadenopathy,  Eye: normal EOM, normal conjunctiva and lids CV:  no edema, +2 pedal pulses, S1-S2   Resp: no accessory muscle use, non-labored, no crackles or wheezes, clear to auscultation bilaterally  Skin: no rashes, no areas of induration  Neuro: normal tone, normal sensation to touch Psych:  normal insight, alert and oriented MSK: Normal gait, normal strength      ASSESSMENT & PLAN:   Acute non-recurrent frontal  sinusitis Possible for bacterial sinusitis given duration and recurrent worsening. - Augmentin provided - Advised supportive care - Given indications to return.

## 2017-02-24 NOTE — Patient Instructions (Addendum)
Thank you for coming in,   Please try things such as zyrtec-D or allegra-D which is an antihistamine and decongestant.   Please try afrin which will help with nasal congestion but use for only three days.   Please also try using a netti pot on a regular occasion.  Honey can help with a sore throat.      Please feel free to call with any questions or concerns at any time, at [phone]. --Dr. Aedyn Mckeon  

## 2017-02-26 NOTE — Assessment & Plan Note (Signed)
Possible for bacterial sinusitis given duration and recurrent worsening. - Augmentin provided - Advised supportive care - Given indications to return.

## 2017-03-11 IMAGING — CT CT ABD-PELV W/ CM
2 of 5 series · 17 of 46 positions shown, 19 images · IV contrast (APPLIED)
Comparison: 11/01/2008

CLINICAL DATA: Right upper quadrant abdominal pain. Shortness of
breath.

EXAM:
CT ABDOMEN AND PELVIS WITH CONTRAST
TECHNIQUE: Multidetector CT imaging of the abdomen and pelvis was performed
using the standard protocol following bolus administration of
intravenous contrast.
CONTRAST:  100mL OMNIPAQUE IOHEXOL 300 MG/ML  SOLN

[Series 2: abd/ pelvis 5.0 i30f 1 · axial · 0.81mm/px · z∈[+741,+1196]mm · 14 of 103 slices shown, 16 images]
[im 6/103  soft-tissue]
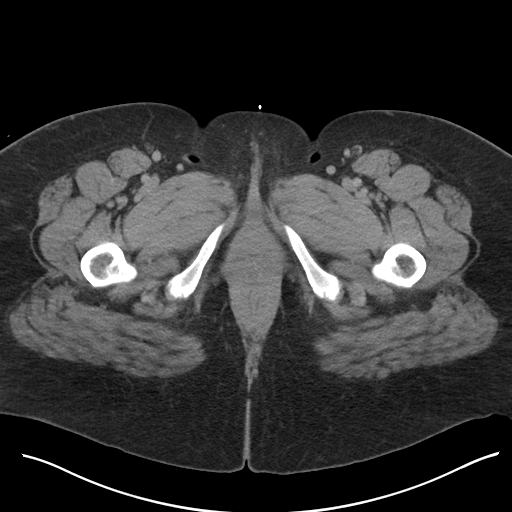
[im 6/103  bone]
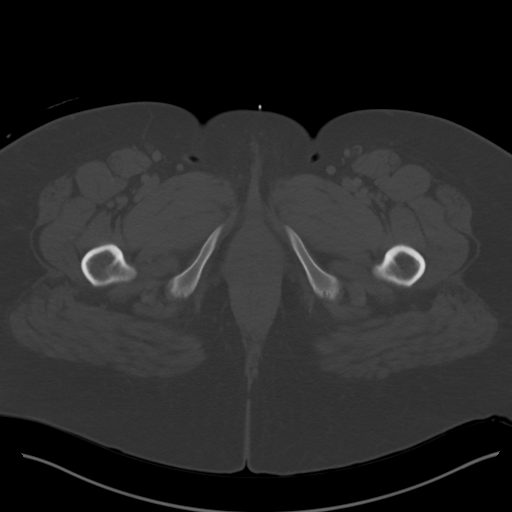
[im 12/103  soft-tissue]
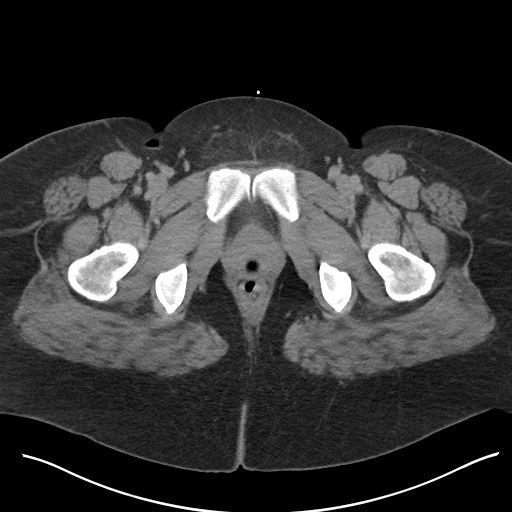
[im 23/103  soft-tissue]
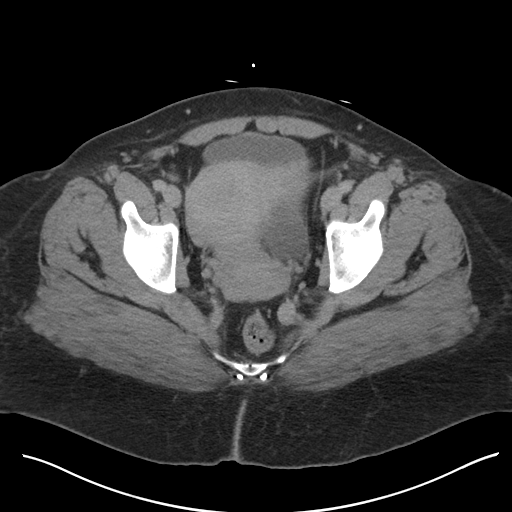
[im 29/103  soft-tissue]
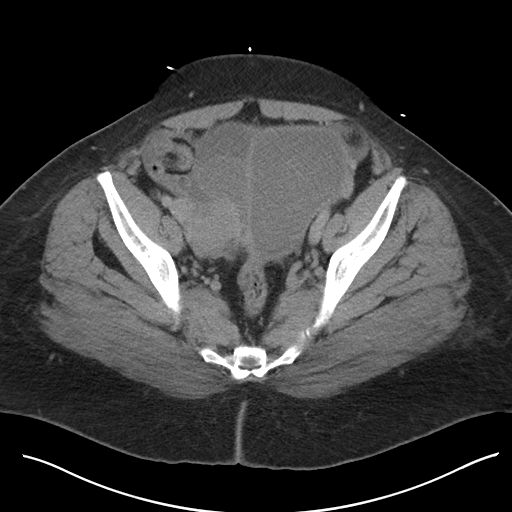
[im 35/103  soft-tissue]
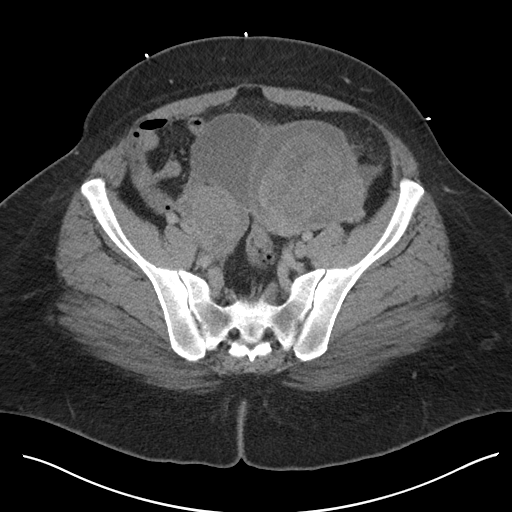
[im 40/103  soft-tissue]
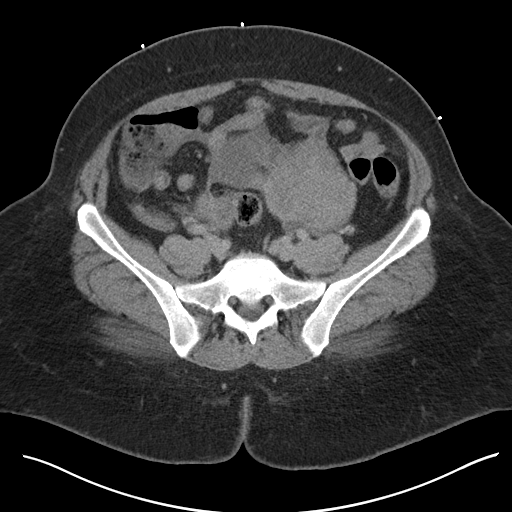
[im 46/103  soft-tissue]
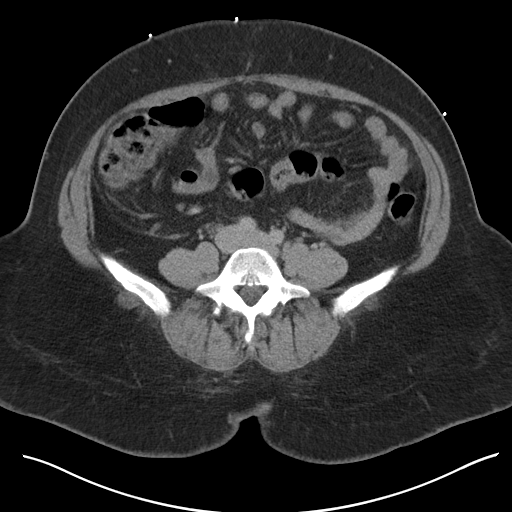
[im 57/103  soft-tissue]
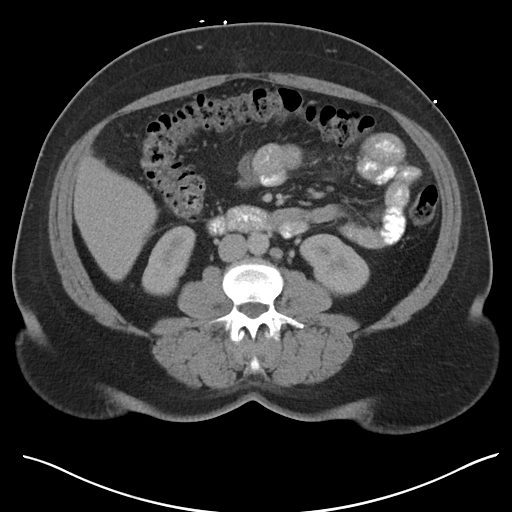
[im 63/103  soft-tissue]
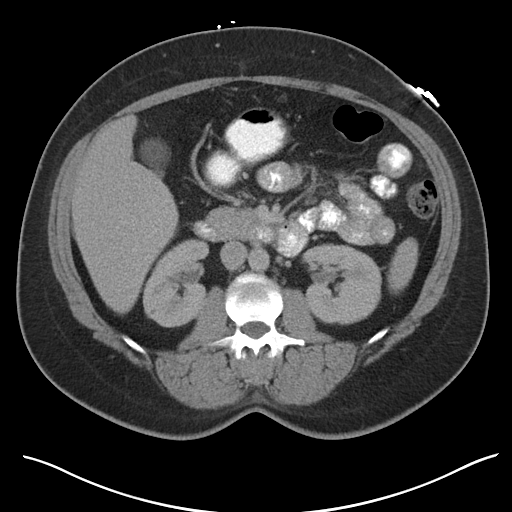
[im 63/103  bone]
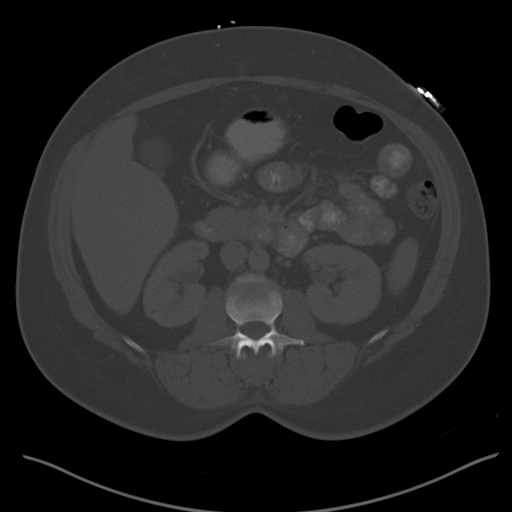
[im 69/103  soft-tissue]
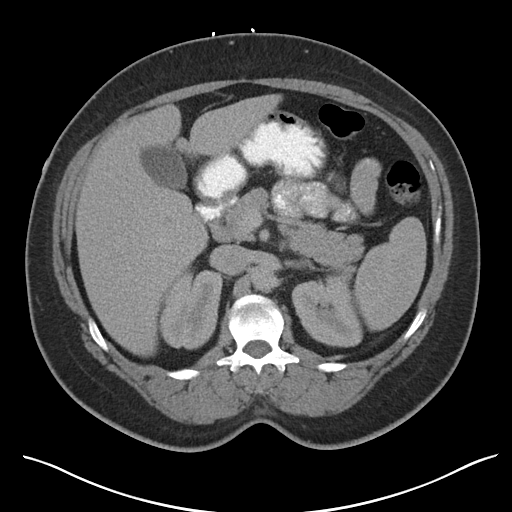
[im 74/103  soft-tissue]
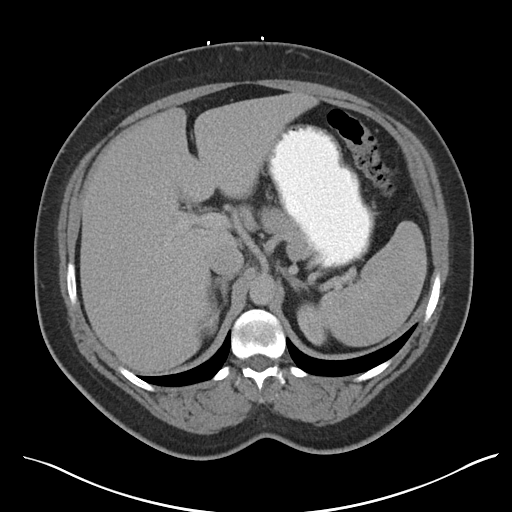
[im 80/103  soft-tissue]
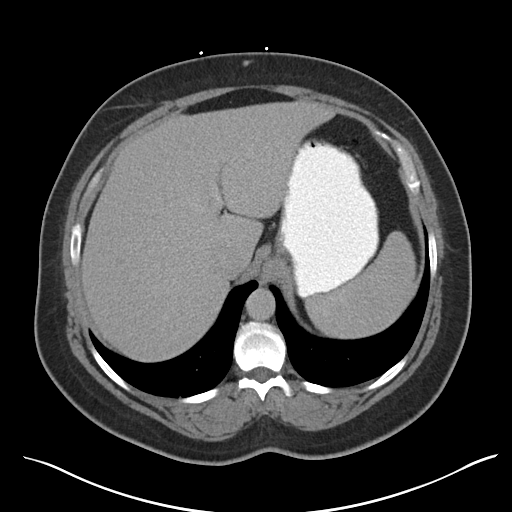
[im 91/103  soft-tissue]
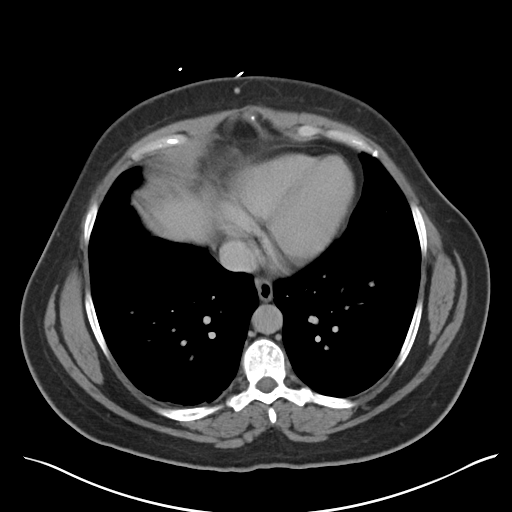
[im 97/103  soft-tissue]
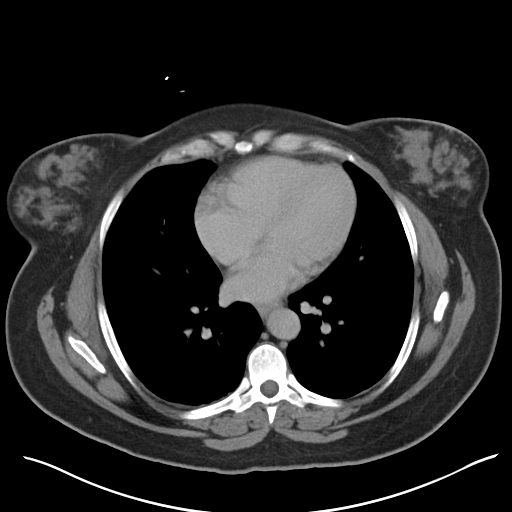

[Series 5: coronal soft tissue · coronal · 0.98mm/px · 3 of 102 slices shown]
[im 34/102  soft-tissue]
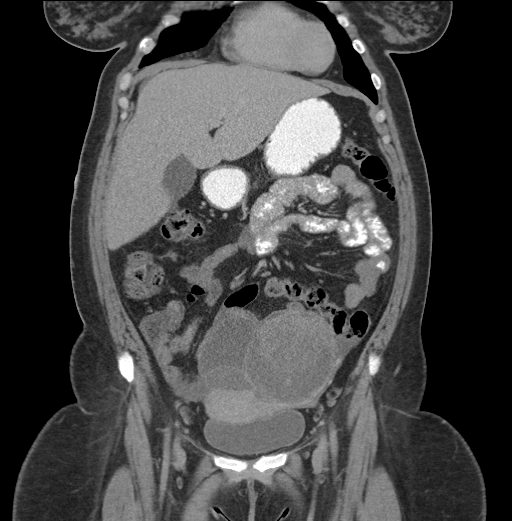
[im 45/102  soft-tissue]
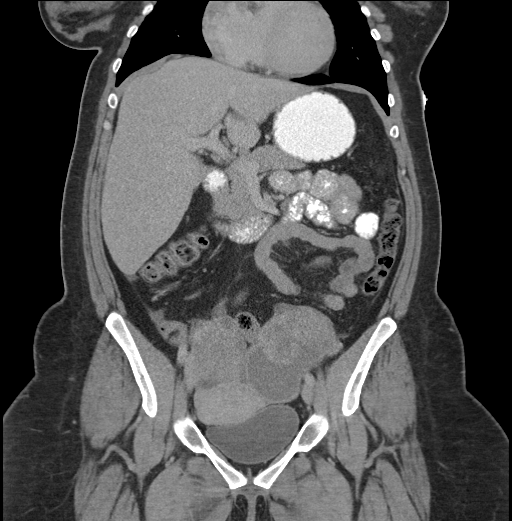
[im 57/102  soft-tissue]
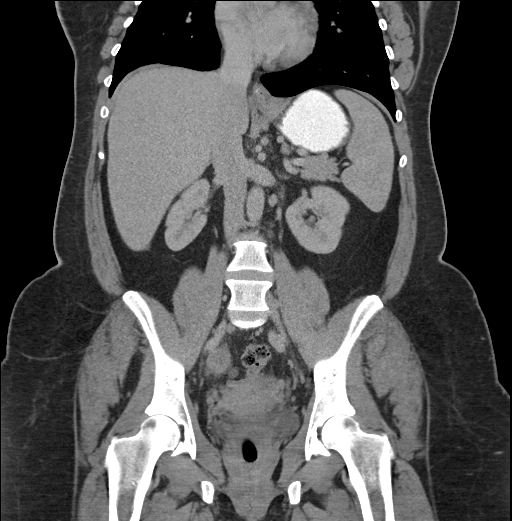

[17 of 46 positions shown; findings below may reference images not displayed]

FINDINGS: Lower chest:  Normal.

Hepatobiliary: There is a new 17 mm vague area of lucency in the
posterior aspect of the dome of the right lobe of the liver. There
is slight chronic hepatomegaly. Biliary tree is normal.

Pancreas: Normal.

Spleen: Normal.

Adrenals/Urinary Tract: Normal except for a tiny stone in the lower
pole of the left kidney.

Stomach/Bowel: Normal.

Vascular/Lymphatic: No adenopathy.  No atherosclerotic disease.

Reproductive: The patient has developed bilateral complex mixed
solid and cystic ovarian masses consistent with ovarian carcinoma.
The total mass measures 14 x 10 x 11 cm. The uterus is deviated
inferiorly by the large mass but otherwise appears normal.

Other: There is a small amount of ascites in the abdomen and pelvis.

Musculoskeletal: Normal.
IMPRESSION: 1. Large mixed solid and cystic bilateral ovarian masses consistent
with ovarian carcinoma. Small amount of free fluid in the abdomen
and pelvis.
2. Chronic hepatomegaly. There is a new vague 17 mm lesion in the
dome of the right lobe which is worrisome for metastatic disease.

## 2017-04-27 ENCOUNTER — Other Ambulatory Visit: Payer: Self-pay

## 2017-04-27 ENCOUNTER — Ambulatory Visit: Payer: Self-pay

## 2017-04-27 ENCOUNTER — Ambulatory Visit: Payer: 59 | Admitting: Family Medicine

## 2017-04-27 ENCOUNTER — Encounter: Payer: Self-pay | Admitting: Family Medicine

## 2017-04-27 VITALS — BP 130/82 | HR 83 | Temp 98.6°F | Resp 16 | Ht 67.72 in | Wt 311.0 lb

## 2017-04-27 DIAGNOSIS — G8929 Other chronic pain: Secondary | ICD-10-CM

## 2017-04-27 DIAGNOSIS — M545 Low back pain: Secondary | ICD-10-CM

## 2017-04-27 LAB — POCT URINALYSIS DIP (MANUAL ENTRY)
Bilirubin, UA: NEGATIVE
Blood, UA: NEGATIVE
Glucose, UA: NEGATIVE mg/dL
Ketones, POC UA: NEGATIVE mg/dL
Leukocytes, UA: NEGATIVE
Nitrite, UA: NEGATIVE
Protein Ur, POC: NEGATIVE mg/dL
Spec Grav, UA: 1.025 (ref 1.010–1.025)
Urobilinogen, UA: 1 E.U./dL
pH, UA: 6.5 (ref 5.0–8.0)

## 2017-04-27 MED ORDER — TRAMADOL HCL 50 MG PO TABS: 50.0000 mg | ORAL_TABLET | Freq: Three times a day (TID) | ORAL | 0 refills | Status: DC | PRN

## 2017-04-27 MED ORDER — CYCLOBENZAPRINE HCL 5 MG PO TABS: 10.0000 mg | ORAL_TABLET | Freq: Three times a day (TID) | ORAL | 1 refills | Status: DC

## 2017-04-27 NOTE — Patient Instructions (Signed)
     IF you received an x-ray today, you will receive an invoice from [facility]. Please contact [facility] at [phone] with questions or concerns regarding your invoice.   IF you received labwork today, you will receive an invoice from [facility]. Please contact [facility] at [phone] with questions or concerns regarding your invoice.   Our billing staff will not be able to assist you with questions regarding bills from these companies.  You will be contacted with the lab results as soon as they are available. The fastest way to get your results is to activate your My Chart account. Instructions are located on the last page of this paperwork. If you have not heard from us regarding the results in 2 weeks, please contact this office.     

## 2017-04-27 NOTE — Telephone Encounter (Signed)
   Reason for Disposition . [1] MODERATE back pain (e.g., interferes with normal activities) AND [2] present > 3 days  Answer Assessment - Initial Assessment Questions 1. ONSET: "When did the pain begin?"      Started 2 days ago 2. LOCATION: "Where does it hurt?" (upper, mid or lower back)     Low back around both sides 3. SEVERITY: "How bad is the pain?"  (e.g., Scale 1-10; mild, moderate, or severe)   - MILD (1-3): doesn't interfere with normal activities    - MODERATE (4-7): interferes with normal activities or awakens from sleep    - SEVERE (8-10): excruciating pain, unable to do any normal activities      10 4. PATTERN: "Is the pain constant?" (e.g., yes, no; constant, intermittent)      Comes and goes 5. RADIATION: "Does the pain shoot into your legs or elsewhere?"     Right hip 6. CAUSE:  "What do you think is causing the back pain?"      Muscular 7. BACK OVERUSE:  "Any recent lifting of heavy objects, strenuous work or exercise?"     No 8. MEDICATIONS: "What have you taken so far for the pain?" (e.g., nothing, acetaminophen, NSAIDS)     Muscle relaxer 9. NEUROLOGIC SYMPTOMS: "Do you have any weakness, numbness, or problems with bowel/bladder control?"     No 10. OTHER SYMPTOMS: "Do you have any other symptoms?" (e.g., fever, abdominal pain, burning with urination, blood in urine)       No 11. PREGNANCY: "Is there any chance you are pregnant?" (e.g., yes, no; LMP)       No  Protocols used: BACK PAIN-A-AH

## 2017-04-27 NOTE — Telephone Encounter (Signed)
Pt. States she took a Flexeril without "much relief." Appointment made for today.

## 2017-04-27 NOTE — Progress Notes (Signed)
12/20/20184:17 PM  [name] 05-03-1971, 46 y.o. female [id number]  Chief Complaint  Patient presents with  . Back Pain    lower back x 3 days, pt states the pain is sharpe and more on the right side that radiates from the center of the spine.    Patient Care Team: Biagio Borg, MD as PCP - General  HPI:   Patient is a 46 y.o. female with past medical history significant for right sided low back pain who presents today for flare up. Has been evaluated by PCP for this, believes there is a component of exacerbation due to knee and ankle issues. He has rx tramadol and flexeril which she has been taking as prescribed for the past several days. Last rx written in June 2018. She has not been using ice nor heat. She does not do stretches. She has never done PT.   Patient denies any radiation of pain, any changes to bowel or bladder function, and fever or chills, any numbness or tingling.   PCP had no availability today  Depression screen [facility] 2/9 04/27/2017 07/06/2016  Decreased Interest 0 0  Down, Depressed, Hopeless 0 1  PHQ - 2 Score 0 1  Some recent data might be hidden    Allergies  Allergen Reactions  . Moxifloxacin Anaphylaxis    needs epinephrine shot  . Quinolones Anaphylaxis, Shortness Of Breath and Swelling  . Doxycycline Nausea And Vomiting  . Escitalopram Oxalate Other (See Comments)     fatigue  . Sulfonamide Derivatives Swelling and Rash    needs epinephrine shot--rash and lip swelling    Prior to Admission medications   Medication Sig Start Date End Date Taking? Authorizing Provider  ALPRAZolam Duanne Moron) 1 MG tablet TAKE 1 TABLET BY MOUTH TWICE DAILY AS NEEDED FOR ANXIETY 01/24/17  Yes Biagio Borg, MD  amphetamine-dextroamphetamine (ADDERALL) 5 MG tablet Take 1 tablet (5 mg total) by mouth 2 (two) times daily with a meal. 01/30/17  Yes Everitt Amber, MD  Cyanocobalamin (VITAMIN B-12 PO) Take 2 tablets by mouth daily.   Yes [provider]    cyclobenzaprine (FLEXERIL) 5 MG tablet Take 1 tablet (5 mg total) by mouth 2 (two) times daily. 11/01/16  Yes Biagio Borg, MD  estradiol (VIVELLE-DOT) 0.05 MG/24HR patch Place 1 patch (0.05 mg total) onto the skin 2 (two) times a week. 08/25/16  Yes Everitt Amber, MD  gabapentin (NEURONTIN) 600 MG tablet Take 2 tablets (1,200 mg total) by mouth 3 (three) times daily. 06/30/16  Yes Narda Amber K, DO  levothyroxine (SYNTHROID, LEVOTHROID) 50 MCG tablet Take 1 tablet (50 mcg total) by mouth daily before breakfast. 07/06/16  Yes Biagio Borg, MD  lisinopril (PRINIVIL,ZESTRIL) 5 MG tablet Take 0.5 tablets (2.5 mg total) by mouth daily. 06/16/16  Yes Nche, Charlene Brooke, NP  meloxicam (MOBIC) 15 MG tablet Take 1 tablet (15 mg total) by mouth daily as needed for pain. 05/05/16  Yes Biagio Borg, MD  montelukast (SINGULAIR) 10 MG tablet Take 1 tablet (10 mg total) by mouth at bedtime. 11/01/16  Yes Biagio Borg, MD  pantoprazole (PROTONIX) 40 MG tablet Take 1 tablet twice a day for 2 weeks then 1 tablet daily. 07/06/16  Yes Biagio Borg, MD  sodium chloride (OCEAN) 0.65 % SOLN nasal spray Place 1 spray into both nostrils daily as needed for congestion.   Yes [provider]  tetrahydrozoline-zinc (VISINE-AC) 0.05-0.25 % ophthalmic solution Place 2 drops into both eyes  3 (three) times daily as needed (dry eyes).   Yes [provider]  traMADol (ULTRAM) 50 MG tablet Take 1 tablet (50 mg total) by mouth every 8 (eight) hours as needed. 11/04/16  Yes Biagio Borg, MD  escitalopram (LEXAPRO) 10 MG tablet Take 1 tablet (10 mg total) by mouth daily. 01/26/17 04/26/17  Biagio Borg, MD    Past Medical History:  Diagnosis Date  . Anxiety   . Elevated blood pressure, situational 05/05/2016  . Endometriosis   . Exercise-induced asthma 09/08/2015  . Ovarian cancer, bilateral ([employer]) 11/28/2014  . PONV (postoperative nausea and vomiting)   . Thyroid disease     Past Surgical History:  Procedure  Laterality Date  . ABDOMINAL HYSTERECTOMY  11/10/14   at [facility], Exp lap, supracervical hyst, BSO, infracolic omentectomy, lymphadenectomy, aortic lymph node sampling  . IR GENERIC HISTORICAL  05/12/2016   IR REMOVAL TUN ACCESS W/ PORT W/O FL MOD SED 05/12/2016 [facility]    Social History   Tobacco Use  . Smoking status: Never Smoker  . Smokeless tobacco: Never Used  Substance Use Topics  . Alcohol use: Yes    Alcohol/week: 0.0 oz    Comment: Rarely    Family History  Problem Relation Age of Onset  . Hypertension Mother   . Diabetes Mother   . Stroke Mother        Deceased  . Atrial fibrillation Father        Living  . Diabetes Maternal Aunt   . Heart failure Maternal Grandmother   . Heart attack Maternal Grandfather   . Dementia Paternal Grandmother   . Dementia Paternal Grandfather   . Healthy Brother     ROS Per hpi  OBJECTIVE:  Blood pressure 130/82, pulse 83, temperature 98.6 F (37 C), temperature source Oral, resp. rate 16, height 5' 7.72" (1.72 m), weight (!) 311 lb (141.1 kg), last menstrual period 11/02/2014, SpO2 94 %.  Physical Exam  Constitutional: She is oriented to person, place, and time and well-developed, well-nourished, and in no distress.  HENT:  Head: Normocephalic and atraumatic.  Mouth/Throat: Oropharynx is clear and moist. No oropharyngeal exudate.  Eyes: EOM are normal. Pupils are equal, round, and reactive to light. No scleral icterus.  Neck: Neck supple.  Cardiovascular: Normal rate, regular rhythm and normal heart sounds. Exam reveals no gallop and no friction rub.  No murmur heard. Pulmonary/Chest: Effort normal and breath sounds normal. She has no wheezes. She has no rales.  Musculoskeletal: She exhibits no edema.       Lumbar back: She exhibits tenderness (right paraspinal).  Neurological: She is alert and oriented to person, place, and time. She has normal strength and normal reflexes. She has a normal Straight Leg Raise Test.  Skin:  Skin is warm and dry.     Results for orders placed or performed in visit on 04/27/17 (from the past 24 hour(s))  POCT urinalysis dipstick     Status: Abnormal   Collection Time: 04/27/17  3:55 PM  Result Value Ref Range   Color, UA yellow yellow   Clarity, UA cloudy (A) clear   Glucose, UA negative negative mg/dL   Bilirubin, UA negative negative   Ketones, POC UA negative negative mg/dL   Spec Grav, UA 1.025 1.010 - 1.025   Blood, UA negative negative   pH, UA 6.5 5.0 - 8.0   Protein Ur, POC negative negative mg/dL   Urobilinogen, UA 1.0 0.2 or 1.0 E.U./dL  Nitrite, UA Negative Negative   Leukocytes, UA Negative Negative      ASSESSMENT and PLAN  1. Chronic right-sided low back pain, with sciatica presence unspecified Discussed increasing flexeril, refilled tramadol. [place] CSR reviewed and appropriate. Discussed supportive measures. RTC precautions discussed.   - POCT urinalysis dipstick  Other orders - traMADol (ULTRAM) 50 MG tablet; Take 1 tablet (50 mg total) by mouth every 8 (eight) hours as needed. - cyclobenzaprine (FLEXERIL) 5 MG tablet; Take 2 tablets (10 mg total) by mouth 3 (three) times daily.  Return if symptoms worsen or fail to improve.    Rutherford Guys, MD [facility] at [place], [place] [address] Ph.  [phone] Fax [phone]

## 2017-05-04 ENCOUNTER — Encounter: Payer: Self-pay | Admitting: Family Medicine

## 2017-05-18 ENCOUNTER — Other Ambulatory Visit: Payer: Self-pay | Admitting: Internal Medicine

## 2017-05-18 DIAGNOSIS — I10 Essential (primary) hypertension: Secondary | ICD-10-CM

## 2017-06-04 ENCOUNTER — Other Ambulatory Visit: Payer: Self-pay | Admitting: Internal Medicine

## 2017-06-20 ENCOUNTER — Ambulatory Visit (INDEPENDENT_AMBULATORY_CARE_PROVIDER_SITE_OTHER): Payer: 59 | Admitting: Family Medicine

## 2017-06-20 ENCOUNTER — Encounter: Payer: Self-pay | Admitting: Family Medicine

## 2017-06-20 VITALS — BP 132/76 | HR 68 | Temp 98.0°F | Ht 67.0 in | Wt 309.0 lb

## 2017-06-20 DIAGNOSIS — R6889 Other general symptoms and signs: Secondary | ICD-10-CM

## 2017-06-20 NOTE — Assessment & Plan Note (Signed)
Symptoms appear to be viral in nature. Possible for flu. - Counseled on supportive care - Note provided for school. - Given indications to follow-up

## 2017-06-20 NOTE — Progress Notes (Signed)
[name] - 47 y.o. female MRN [id number]  Date of birth: 1970-06-05  SUBJECTIVE:  Including CC & ROS.  Chief Complaint  Patient presents with  . Cough    [name] is a 47 y.o. female that is presenting with a cough and sinus pressure.Symptoms started two days ago. She has been coughing up mucous is brownish in color. Admits to chills and body aches, She did receive a flu shot. She has been her boyfriend's kids that have similar symptoms. She has tried several over-the-counter medications with limited improvement. Feels like her symptoms are staying the same. She feels that she is having swelling of her glands around her neck.   Review of Systems  Constitutional: Negative for fever.  HENT: Positive for rhinorrhea, sinus pressure and sinus pain.   Respiratory: Positive for cough.   Cardiovascular: Negative for chest pain.  Gastrointestinal: Negative for abdominal pain.  Hematological: Positive for adenopathy.    HISTORY: Past Medical, Surgical, Social, and Family History Reviewed & Updated per EMR.   Pertinent Historical Findings include:  Past Medical History:  Diagnosis Date  . Anxiety   . Elevated blood pressure, situational 05/05/2016  . Endometriosis   . Exercise-induced asthma 09/08/2015  . Ovarian cancer, bilateral ([place]) 11/28/2014  . PONV (postoperative nausea and vomiting)   . Thyroid disease     Past Surgical History:  Procedure Laterality Date  . ABDOMINAL HYSTERECTOMY  11/10/14   at [facility], Exp lap, supracervical hyst, BSO, infracolic omentectomy, lymphadenectomy, aortic lymph node sampling  . IR GENERIC HISTORICAL  05/12/2016   IR REMOVAL TUN ACCESS W/ PORT W/O FL MOD SED 05/12/2016 [facility]    Allergies  Allergen Reactions  . Moxifloxacin Anaphylaxis    needs epinephrine shot  . Quinolones Anaphylaxis, Shortness Of Breath and Swelling  . Doxycycline Nausea And Vomiting  . Escitalopram Oxalate Other (See Comments)     fatigue  . Sulfonamide  Derivatives Swelling and Rash    needs epinephrine shot--rash and lip swelling    Family History  Problem Relation Age of Onset  . Hypertension Mother   . Diabetes Mother   . Stroke Mother        Deceased  . Atrial fibrillation Father        Living  . Diabetes Maternal Aunt   . Heart failure Maternal Grandmother   . Heart attack Maternal Grandfather   . Dementia Paternal Grandmother   . Dementia Paternal Grandfather   . Healthy Brother      Social History   Socioeconomic History  . Marital status: Divorced    Spouse name: Not on file  . Number of children: 0  . Years of education: Not on file  . Highest education level: Not on file  Social Needs  . Financial resource strain: Not on file  . Food insecurity - worry: Not on file  . Food insecurity - inability: Not on file  . Transportation needs - medical: Not on file  . Transportation needs - non-medical: Not on file  Occupational History  . Not on file  Tobacco Use  . Smoking status: Never Smoker  . Smokeless tobacco: Never Used  Substance and Sexual Activity  . Alcohol use: Yes    Alcohol/week: 0.0 oz    Comment: Rarely  . Drug use: No  . Sexual activity: Not on file  Other Topics Concern  . Not on file  Social History Narrative   Lives with boyfriend in a one story home.  Has no  children.     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     Education: college.     PHYSICAL EXAM:  VS: BP 132/76 (BP Location: Left Arm, Patient Position: Sitting, Cuff Size: Normal)   Pulse 68   Temp 98 F (36.7 C) (Oral)   Ht 5\' 7"  (1.702 m)   Wt (!) 309 lb (140.2 kg)   LMP 11/02/2014   SpO2 98%   BMI 48.40 kg/m  Physical Exam Gen: NAD, alert, cooperative with exam,  ENT: normal lips, normal nasal mucosa, tympanic membranes clear and intact bilaterally, normal oropharynx, cervical lymphadenopathy present Eye: normal EOM, normal conjunctiva and lids CV:  no edema, +2 pedal pulses, regular rate and rhythm, S1-S2   Resp: no accessory  muscle use, non-labored, clear to auscultation bilaterally, no crackles or wheezes Skin: no rashes, no areas of induration  Neuro: normal tone, normal sensation to touch Psych:  normal insight, alert and oriented MSK: Normal gait, normal strength       ASSESSMENT & PLAN:   Flu-like symptoms Symptoms appear to be viral in nature. Possible for flu. - Counseled on supportive care - Note provided for school. - Given indications to follow-up

## 2017-06-20 NOTE — Patient Instructions (Signed)
Please try things such as zyrtec-D or allegra-D which is an antihistamine and decongestant.   Please try afrin which will help with nasal congestion but use for only three days.   Please also try using a netti pot on a regular occasion.  Honey can help with a sore throat.

## 2017-07-05 ENCOUNTER — Encounter: Payer: Self-pay | Admitting: Family

## 2017-07-05 ENCOUNTER — Ambulatory Visit: Payer: 59 | Admitting: Family

## 2017-07-05 VITALS — BP 136/82 | HR 88 | Temp 98.6°F | Ht 67.0 in | Wt 312.1 lb

## 2017-07-05 DIAGNOSIS — J019 Acute sinusitis, unspecified: Secondary | ICD-10-CM

## 2017-07-05 DIAGNOSIS — J209 Acute bronchitis, unspecified: Secondary | ICD-10-CM | POA: Diagnosis not present

## 2017-07-05 MED ORDER — ALBUTEROL SULFATE HFA 108 (90 BASE) MCG/ACT IN AERS: 2.0000 | INHALATION_SPRAY | Freq: Four times a day (QID) | RESPIRATORY_TRACT | 0 refills | Status: DC | PRN

## 2017-07-05 MED ORDER — FLUTICASONE PROPIONATE 50 MCG/ACT NA SUSP: 2.0000 | Freq: Every day | NASAL | 6 refills | Status: DC

## 2017-07-05 MED ORDER — CEFDINIR 300 MG PO CAPS: 300.0000 mg | ORAL_CAPSULE | Freq: Two times a day (BID) | ORAL | 0 refills | Status: DC

## 2017-07-05 NOTE — Progress Notes (Signed)
[name] is a 47 y.o. female with the following history as recorded in EpicCare:  Patient Active Problem List   Diagnosis Date Noted  . Flu-like symptoms 06/20/2017  . Cognitive changes 01/30/2017  . Disturbed concentration 01/26/2017  . Left shoulder pain 11/01/2016  . Right low back pain 11/01/2016  . Elevated blood pressure, situational 05/05/2016  . Surgical menopause 01/29/2016  . OSA on CPAP 12/22/2015  . Cough 11/26/2015  . Capsulitis 10/06/2015  . Ganglion cyst of left foot 10/06/2015  . Fatigue 09/13/2015  . Exercise-induced asthma 09/08/2015  . Exertional dyspnea 09/08/2015  . Peroneal ganglion cyst 07/20/2015  . Low back pain 07/20/2015  . Peroneal tendinitis of left lower leg 06/25/2015  . Nonallopathic lesion of cervical region 06/25/2015  . Wheezing 05/08/2015  . Polyarthralgia 05/08/2015  . Peripheral edema 03/16/2015  . Central line complication 42/70/6237  . Morbid obesity with BMI of 45.0-49.9, adult ([place]) 02/28/2015  . Chemotherapy induced neutropenia ([place]) 02/28/2015  . Genetic testing 01/05/2015  . Chemotherapy induced nausea and vomiting 01/02/2015  . Anxiety 01/02/2015  . Chemotherapy-induced peripheral neuropathy ([place]) 01/02/2015  . Premature surgical menopause 12/17/2014  . Leukopenia due to antineoplastic chemotherapy ([place]) 12/17/2014  . Port catheter in place 12/17/2014  . Encounter for antineoplastic chemotherapy 12/17/2014  . Myalgia 12/09/2014  . Hypersensitivity reaction 12/05/2014  . Ovarian cancer, bilateral ([place]) 11/28/2014  . Poor venous access 11/28/2014  . Postoperative cellulitis of surgical wound 11/24/2014  . Wellness examination 09/10/2014  . Hypothyroidism 09/10/2014  . Hypersomnolence 09/10/2014  . Acute non-recurrent frontal sinusitis 06/24/2014  . Right knee pain 06/24/2014  . Lower back pain 01/08/2014  . EAR PAIN, BILATERAL 12/18/2009  . KNEE PAIN, RIGHT 12/18/2009  . INGROWN TOENAIL 12/17/2008  . BACK PAIN  12/17/2008  . Acute bronchospasm 11/11/2008  . Depression 11/29/2007  . Insomnia 11/29/2007  . Allergic rhinitis 04/13/2007  . GERD 04/13/2007  . ENDOMETRIOSIS 04/13/2007  . Migraine headache 01/29/2007    Current Outpatient Medications  Medication Sig Dispense Refill  . ALPRAZolam (XANAX) 1 MG tablet TAKE 1 TABLET BY MOUTH TWICE DAILY AS NEEDED FOR ANXIETY 60 tablet 5  . amphetamine-dextroamphetamine (ADDERALL) 5 MG tablet Take 1 tablet (5 mg total) by mouth 2 (two) times daily with a meal. 180 tablet 0  . Cyanocobalamin (VITAMIN B-12 PO) Take 2 tablets by mouth daily.    . cyclobenzaprine (FLEXERIL) 5 MG tablet Take 2 tablets (10 mg total) by mouth 3 (three) times daily. 30 tablet 1  . estradiol (VIVELLE-DOT) 0.05 MG/24HR patch Place 1 patch (0.05 mg total) onto the skin 2 (two) times a week. 8 patch 12  . gabapentin (NEURONTIN) 600 MG tablet Take 2 tablets (1,200 mg total) by mouth 3 (three) times daily. 540 tablet 3  . levothyroxine (SYNTHROID, LEVOTHROID) 50 MCG tablet Take 1 tablet (50 mcg total) by mouth daily before breakfast. 90 tablet 3  . lisinopril (PRINIVIL,ZESTRIL) 5 MG tablet TAKE 1 TABLET BY MOUTH ONCE DAILY 90 tablet 0  . meloxicam (MOBIC) 15 MG tablet TAKE ONE TABLET BY MOUTH ONCE DAILY AS NEEDED FOR PAIN 30 tablet 2  . montelukast (SINGULAIR) 10 MG tablet Take 1 tablet (10 mg total) by mouth at bedtime. 90 tablet 3  . pantoprazole (PROTONIX) 40 MG tablet Take 1 tablet twice a day for 2 weeks then 1 tablet daily. 90 tablet 3  . sodium chloride (OCEAN) 0.65 % SOLN nasal spray Place 1 spray into both nostrils daily as needed for congestion.    Marland Kitchen  tetrahydrozoline-zinc (VISINE-AC) 0.05-0.25 % ophthalmic solution Place 2 drops into both eyes 3 (three) times daily as needed (dry eyes).    Marland Kitchen albuterol (PROVENTIL HFA;VENTOLIN HFA) 108 (90 Base) MCG/ACT inhaler Inhale 2 puffs into the lungs every 6 (six) hours as needed for wheezing or shortness of breath. 1 Inhaler 0  . cefdinir  (OMNICEF) 300 MG capsule Take 1 capsule (300 mg total) by mouth 2 (two) times daily. 20 capsule 0  . escitalopram (LEXAPRO) 10 MG tablet Take 1 tablet (10 mg total) by mouth daily. 90 tablet 3  . fluticasone (FLONASE) 50 MCG/ACT nasal spray Place 2 sprays into both nostrils daily. 16 g 6   No current facility-administered medications for this visit.     Allergies: Moxifloxacin; Quinolones; Doxycycline; Escitalopram oxalate; and Sulfonamide derivatives  Past Medical History:  Diagnosis Date  . Anxiety   . Elevated blood pressure, situational 05/05/2016  . Endometriosis   . Exercise-induced asthma 09/08/2015  . Ovarian cancer, bilateral ([place]) 11/28/2014  . PONV (postoperative nausea and vomiting)   . Thyroid disease     Past Surgical History:  Procedure Laterality Date  . ABDOMINAL HYSTERECTOMY  11/10/14   at [facility], Exp lap, supracervical hyst, BSO, infracolic omentectomy, lymphadenectomy, aortic lymph node sampling  . IR GENERIC HISTORICAL  05/12/2016   IR REMOVAL TUN ACCESS W/ PORT W/O FL MOD SED 05/12/2016 [facility]    Family History  Problem Relation Age of Onset  . Hypertension Mother   . Diabetes Mother   . Stroke Mother        Deceased  . Atrial fibrillation Father        Living  . Diabetes Maternal Aunt   . Heart failure Maternal Grandmother   . Heart attack Maternal Grandfather   . Dementia Paternal Grandmother   . Dementia Paternal Grandfather   . Healthy Brother     Social History   Tobacco Use  . Smoking status: Never Smoker  . Smokeless tobacco: Never Used  Substance Use Topics  . Alcohol use: Yes    Alcohol/week: 0.0 oz    Comment: Rarely    Subjective:  Seen with flu-like symptoms approximately 10 days ago; now feels that symptoms have progressed into sinus infection; + cough/ chest congestion; feels like there is a "tickle" in the back of throat; occasional shortness of breath with coughing; no chest pain; feels like she is continuing to have hot and cold  flashes; does occasionally feel herself wheezing- would like an inhaler if possible;   Objective:  Vitals:   07/05/17 1442  BP: 136/82  Pulse: 88  Temp: 98.6 F (37 C)  TempSrc: Oral  SpO2: 99%  Weight: (!) 312 lb 1.3 oz (141.6 kg)  Height: 5\' 7"  (1.702 m)    General: Well developed, well nourished, in no acute distress  Skin : Warm and dry.  Head: Normocephalic and atraumatic  Eyes: Sclera and conjunctiva clear; pupils round and reactive to light; extraocular movements intact  Ears: External normal; canals clear; tympanic membranes erythematous bilaterally Oropharynx: Pink, supple. No suspicious lesions  Neck: Supple without thyromegaly, adenopathy  Lungs: Respirations unlabored; clear to auscultation bilaterally without wheeze, rales, rhonchi  CVS exam: normal rate and regular rhythm.  Neurologic: Alert and oriented; speech intact; face symmetrical; moves all extremities well; CNII-XII intact without focal deficit   Assessment:  1. Acute sinusitis, recurrence not specified, unspecified location   2. Acute bronchitis, unspecified organism     Plan:  Rx for Omnicef 300 mg  bid x 10 days, Rx for Flonase and Albuterol HFA; increase fluids, rest and follow up worse, no better; school note given as requested for past 2 days.   No Follow-up on file.  No orders of the defined types were placed in this encounter.   Requested Prescriptions   Signed Prescriptions Disp Refills  . fluticasone (FLONASE) 50 MCG/ACT nasal spray 16 g 6    Sig: Place 2 sprays into both nostrils daily.  Marland Kitchen albuterol (PROVENTIL HFA;VENTOLIN HFA) 108 (90 Base) MCG/ACT inhaler 1 Inhaler 0    Sig: Inhale 2 puffs into the lungs every 6 (six) hours as needed for wheezing or shortness of breath.  . cefdinir (OMNICEF) 300 MG capsule 20 capsule 0    Sig: Take 1 capsule (300 mg total) by mouth 2 (two) times daily.

## 2017-07-15 ENCOUNTER — Other Ambulatory Visit: Payer: Self-pay | Admitting: Internal Medicine

## 2017-07-15 DIAGNOSIS — K219 Gastro-esophageal reflux disease without esophagitis: Secondary | ICD-10-CM

## 2017-07-27 ENCOUNTER — Other Ambulatory Visit: Payer: Self-pay | Admitting: Family

## 2017-07-31 ENCOUNTER — Other Ambulatory Visit: Payer: Self-pay | Admitting: Gynecologic Oncology

## 2017-07-31 ENCOUNTER — Other Ambulatory Visit: Payer: Self-pay | Admitting: Neurology

## 2017-07-31 ENCOUNTER — Other Ambulatory Visit: Payer: Self-pay | Admitting: Internal Medicine

## 2017-07-31 MED ORDER — AMPHETAMINE-DEXTROAMPHETAMINE 5 MG PO TABS: 5.0000 mg | ORAL_TABLET | Freq: Two times a day (BID) | ORAL | 0 refills | Status: DC

## 2017-07-31 NOTE — Telephone Encounter (Signed)
Copied from [place] [id number]. Topic: Quick Communication - Rx Refill/Question >> Jul 31, 2017  9:37 AM Aurelio Brash B wrote: Medication: albuterol (PROVENTIL HFA;VENTOLIN HFA) 108 (90 Base) MCG/ACT inhaler  Has the patient contacted their pharmacy? Yes  (Agent: If no, request that the patient contact the pharmacy for the refill.)  Preferred Pharmacy (with phone number or street name):[facility] [place] [place] [phone] (Phone) [phone] (Fax)        Agent: Please be advised that RX refills may take up to 3 business days. We ask that you follow-up with your pharmacy.

## 2017-07-31 NOTE — Telephone Encounter (Signed)
Called patient in regards to her requesting albuterol inhaler refill. No answer, left message for to her to call us back and ask for a triage nurse.

## 2017-08-01 MED ORDER — ALBUTEROL SULFATE HFA 108 (90 BASE) MCG/ACT IN AERS: 2.0000 | INHALATION_SPRAY | Freq: Four times a day (QID) | RESPIRATORY_TRACT | 0 refills | Status: DC | PRN

## 2017-08-18 ENCOUNTER — Encounter: Payer: Self-pay | Admitting: Internal Medicine

## 2017-08-18 ENCOUNTER — Ambulatory Visit: Payer: 59 | Admitting: Internal Medicine

## 2017-08-18 ENCOUNTER — Other Ambulatory Visit: Payer: Self-pay | Admitting: Neurology

## 2017-08-18 VITALS — BP 124/76 | HR 93 | Temp 98.2°F | Ht 67.0 in | Wt 315.0 lb

## 2017-08-18 DIAGNOSIS — F419 Anxiety disorder, unspecified: Secondary | ICD-10-CM | POA: Diagnosis not present

## 2017-08-18 DIAGNOSIS — J309 Allergic rhinitis, unspecified: Secondary | ICD-10-CM | POA: Diagnosis not present

## 2017-08-18 DIAGNOSIS — J0110 Acute frontal sinusitis, unspecified: Secondary | ICD-10-CM

## 2017-08-18 MED ORDER — HYDROCODONE-HOMATROPINE 5-1.5 MG/5ML PO SYRP: 5.0000 mL | ORAL_SOLUTION | Freq: Four times a day (QID) | ORAL | 0 refills | Status: AC | PRN

## 2017-08-18 MED ORDER — ALPRAZOLAM 1 MG PO TABS: 1.0000 mg | ORAL_TABLET | Freq: Two times a day (BID) | ORAL | 5 refills | Status: DC | PRN

## 2017-08-18 MED ORDER — METHYLPREDNISOLONE ACETATE 80 MG/ML IJ SUSP
80.0000 mg | Freq: Once | INTRAMUSCULAR | Status: AC
  Administered 2017-08-18: 80 mg via INTRAMUSCULAR

## 2017-08-18 MED ORDER — PREDNISONE 10 MG PO TABS: ORAL_TABLET | ORAL | 0 refills | Status: DC

## 2017-08-18 MED ORDER — CEFDINIR 300 MG PO CAPS: 300.0000 mg | ORAL_CAPSULE | Freq: Two times a day (BID) | ORAL | 0 refills | Status: DC

## 2017-08-18 NOTE — Assessment & Plan Note (Signed)
Mild to mod, for antibx course,  to f/u any worsening symptoms or concerns 

## 2017-08-18 NOTE — Assessment & Plan Note (Signed)
Mild sitiuational, reassured, cont same tx

## 2017-08-18 NOTE — Patient Instructions (Signed)
You had the steroid shot today  Please take all new medication as prescribed  - the antibiotic, cough medicine as needed, and prednisone  You can also take Delsym OTC for cough, and/or Mucinex (or it's generic off brand) for congestion, and tylenol as needed for pain.  Please continue all other medications as before, and refills have been done if requested.  Please have the pharmacy call with any other refills you may need.  Please keep your appointments with your specialists as you may have planned  You are given the class note today

## 2017-08-18 NOTE — Assessment & Plan Note (Signed)
Mild to mod, for depomedrol IM 80, predpac asd, to f/u any worsening symptoms or concerns 

## 2017-08-18 NOTE — Progress Notes (Signed)
Subjective:    Patient ID: [name], female    DOB: October 15, 1970, 47 y.o.   MRN: [id number]  HPI  Here with acute cough x 3 days on recurring cough x 7 wks, now scant prod, no hemoptysis but with fever, ticklish throat all the time with some discomfort, has some sinus congestion and maybe post nasal gtt, but with headache, general weakness and malaise, and greenish d/c, but pt denies chest pain, wheezing, increased sob or doe, orthopnea, PND, increased LE swelling, palpitations, dizziness or syncope.   Pt denies polydipsia, polyuria, Does have several wks ongoing nasal allergy symptoms with clearish congestion, itching.  Denies worsening depressive symptoms, suicidal ideation, or panic; has ongoing anxiety Past Medical History:  Diagnosis Date  . Anxiety   . Elevated blood pressure, situational 05/05/2016  . Endometriosis   . Exercise-induced asthma 09/08/2015  . Ovarian cancer, bilateral ([place]) 11/28/2014  . PONV (postoperative nausea and vomiting)   . Thyroid disease    Past Surgical History:  Procedure Laterality Date  . ABDOMINAL HYSTERECTOMY  11/10/14   at [facility], Exp lap, supracervical hyst, BSO, infracolic omentectomy, lymphadenectomy, aortic lymph node sampling  . IR GENERIC HISTORICAL  05/12/2016   IR REMOVAL TUN ACCESS W/ PORT W/O FL MOD SED 05/12/2016 [facility]    reports that she has never smoked. She has never used smokeless tobacco. She reports that she drinks alcohol. She reports that she does not use drugs. family history includes Atrial fibrillation in her father; Dementia in her paternal grandfather and paternal grandmother; Diabetes in her maternal aunt and mother; Healthy in her brother; Heart attack in her maternal grandfather; Heart failure in her maternal grandmother; Hypertension in her mother; Stroke in her mother. Allergies  Allergen Reactions  . Moxifloxacin Anaphylaxis    needs epinephrine shot  . Quinolones Anaphylaxis, Shortness Of Breath and Swelling  .  Doxycycline Nausea And Vomiting  . Escitalopram Oxalate Other (See Comments)     fatigue  . Sulfonamide Derivatives Swelling and Rash    needs epinephrine shot--rash and lip swelling   Review of Systems  Constitutional: Negative for other unusual diaphoresis or sweats HENT: Negative for ear discharge or swelling Eyes: Negative for other worsening visual disturbances Respiratory: Negative for stridor or other swelling  Gastrointestinal: Negative for worsening distension or other blood Genitourinary: Negative for retention or other urinary change Musculoskeletal: Negative for other MSK pain or swelling Skin: Negative for color change or other new lesions Neurological: Negative for worsening tremors and other numbness  Psychiatric/Behavioral: Negative for worsening agitation or other fatigue All other system neg per pt    Objective:   Physical Exam BP 124/76   Pulse 93   Temp 98.2 F (36.8 C) (Oral)   Ht 5\' 7"  (1.702 m)   Wt (!) 315 lb (142.9 kg)   LMP 11/02/2014   SpO2 96%   BMI 49.34 kg/m  VS noted, mild ill Constitutional: Pt appears in NAD HENT: Head: NCAT.  Right Ear: External ear normal.  Left Ear: External ear normal.  Eyes: . Pupils are equal, round, and reactive to light. Conjunctivae and EOM are normal Bilat tm's with mild erythema.  Max sinus areas mild tender.  Pharynx with mild erythema, no exudate Nose: without d/c or deformity Neck: Neck supple. Gross normal ROM Cardiovascular: Normal rate and regular rhythm.   Pulmonary/Chest: Effort normal and breath sounds decreased without rales or wheezing.  Neurological: Pt is alert. At baseline orientation, motor grossly intact Skin: Skin is warm.  No rashes, other new lesions, no LE edema Psychiatric: Pt behavior is normal without agitation  No other exam findings    Assessment & Plan:

## 2017-08-25 ENCOUNTER — Other Ambulatory Visit: Payer: Self-pay | Admitting: Internal Medicine

## 2017-08-29 ENCOUNTER — Telehealth: Payer: Self-pay | Admitting: Neurology

## 2017-08-29 NOTE — Telephone Encounter (Signed)
*  STAT* If patient is at the pharmacy, call can be transferred to refill team.  1.     Which medications need to be refilled? (please list name of each medication and dose if know) Gabapentin  2.     Which pharmacy/location (including street and city if local pharmacy) is [facility] [place]  3.     Do they need a 30 or 90 day supply? [place]

## 2017-08-29 NOTE — Telephone Encounter (Signed)
Please call patient for an appointment and then I will send in enough medication to get them through until then.  Thanks.

## 2017-08-30 NOTE — Telephone Encounter (Signed)
Left pt a message to call and schedule a follow up appointment and to let pt know that her medication will be filled until then

## 2017-09-13 ENCOUNTER — Telehealth: Payer: Self-pay | Admitting: *Deleted

## 2017-09-13 ENCOUNTER — Inpatient Hospital Stay: Payer: 59

## 2017-09-13 NOTE — Telephone Encounter (Signed)
"  I have an appointment today at 3:00 pm but just received a call about an appointment Friday.  Don't understand why because lab should be the same day you see the doctor right." Call transferred after confirmed a GYN/ONC F/U.

## 2017-09-15 ENCOUNTER — Other Ambulatory Visit: Payer: 59

## 2017-09-15 ENCOUNTER — Telehealth: Payer: Self-pay | Admitting: *Deleted

## 2017-09-15 ENCOUNTER — Inpatient Hospital Stay: Payer: 59 | Admitting: Gynecologic Oncology

## 2017-09-15 NOTE — Telephone Encounter (Signed)
Returned the patient's call and left a messge to call the office back. Patient cancelled appts for today, and needs to reschedule

## 2017-09-21 ENCOUNTER — Other Ambulatory Visit: Payer: Self-pay | Admitting: Gynecologic Oncology

## 2017-09-21 DIAGNOSIS — C569 Malignant neoplasm of unspecified ovary: Secondary | ICD-10-CM

## 2017-09-21 MED ORDER — ESTRADIOL 0.05 MG/24HR TD PTTW: 1.0000 | MEDICATED_PATCH | TRANSDERMAL | 12 refills | Status: DC

## 2017-09-28 ENCOUNTER — Telehealth: Payer: Self-pay | Admitting: Neurology

## 2017-09-28 ENCOUNTER — Other Ambulatory Visit: Payer: Self-pay | Admitting: *Deleted

## 2017-09-28 MED ORDER — GABAPENTIN 600 MG PO TABS: 1200.0000 mg | ORAL_TABLET | Freq: Three times a day (TID) | ORAL | 1 refills | Status: DC

## 2017-09-28 NOTE — Telephone Encounter (Signed)
Rx sent in to get patient through until appointment in November.

## 2017-09-28 NOTE — Telephone Encounter (Signed)
Pt called and made an appointment for Dr Serita Grit first available which is November 1st but she needs a refill of her gabapentin, please advise

## 2017-10-06 ENCOUNTER — Telehealth: Payer: Self-pay | Admitting: Neurology

## 2017-10-06 ENCOUNTER — Inpatient Hospital Stay: Payer: 59

## 2017-10-06 ENCOUNTER — Inpatient Hospital Stay: Payer: 59 | Attending: Gynecologic Oncology | Admitting: Gynecologic Oncology

## 2017-10-06 ENCOUNTER — Encounter: Payer: Self-pay | Admitting: Gynecologic Oncology

## 2017-10-06 ENCOUNTER — Encounter: Payer: Self-pay | Admitting: Internal Medicine

## 2017-10-06 VITALS — BP 106/65 | HR 88 | Temp 98.1°F | Resp 20 | Ht 68.0 in | Wt 316.5 lb

## 2017-10-06 DIAGNOSIS — F419 Anxiety disorder, unspecified: Secondary | ICD-10-CM | POA: Diagnosis not present

## 2017-10-06 DIAGNOSIS — Z79899 Other long term (current) drug therapy: Secondary | ICD-10-CM | POA: Diagnosis not present

## 2017-10-06 DIAGNOSIS — C569 Malignant neoplasm of unspecified ovary: Secondary | ICD-10-CM

## 2017-10-06 DIAGNOSIS — C561 Malignant neoplasm of right ovary: Secondary | ICD-10-CM | POA: Insufficient documentation

## 2017-10-06 DIAGNOSIS — C562 Malignant neoplasm of left ovary: Secondary | ICD-10-CM | POA: Insufficient documentation

## 2017-10-06 DIAGNOSIS — E079 Disorder of thyroid, unspecified: Secondary | ICD-10-CM | POA: Insufficient documentation

## 2017-10-06 DIAGNOSIS — R41840 Attention and concentration deficit: Secondary | ICD-10-CM

## 2017-10-06 MED ORDER — AMPHETAMINE-DEXTROAMPHETAMINE 5 MG PO TABS: 5.0000 mg | ORAL_TABLET | Freq: Two times a day (BID) | ORAL | 0 refills | Status: DC

## 2017-10-06 MED ORDER — ESTRADIOL 0.05 MG/24HR TD PTTW: 1.0000 | MEDICATED_PATCH | TRANSDERMAL | 12 refills | Status: DC

## 2017-10-06 NOTE — Telephone Encounter (Signed)
Called and spoke with pharmacy and gave okay to fill dose prescribed by Dr. Posey Pronto.

## 2017-10-06 NOTE — Telephone Encounter (Signed)
Patient called and needs a refill on her Gabapentin 600 MG (120 tablets). She needs it filled today. She is out. [facility] [address] [place]. The pharmacist is needing confirmation or to speak with someone since this is above the recommended amount. She takes it for her Neuropathy and is a Cancer Patient. Please Call. Thanks

## 2017-10-06 NOTE — Patient Instructions (Signed)
Plan to have a CT scan of the abdomen and pelvis and we will contact you with the results.  Plan to follow up in six months or sooner if needed.

## 2017-10-06 NOTE — Progress Notes (Signed)
Follow Up Note: Gyn-Onc  [name] 47 y.o. female  CC:  Chief Complaint  Patient presents with  . Ovarian cancer, unspecified laterality ([place])   Assessment/Plan:   [name] is a 47 y.o. 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 s/p adjuvant carboplatin/paclitaxel chemotherapy (completed November, 2016).  No evidence of recurrence on today's exam but symptoms concerning for recurrence with intermittent nausea, emesis and abdominal.   Recommend repeat CA 125 today. CT abd/pelvis to rule out recurrence.  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 Will prescribe monthly. Patient to discuss her brain fog symptoms with her neurologist.  Low libido/menopausal symptoms - increased dose of vivelle dot.   Recommend follow-up in 6 months. Will continue these until November, 2021.  HPI:  [name] is a 47 year old female woman who presented to the [facility] Emergency department in July, 2016 with acute onset abdominal pain, dyspnea, and bilateral ovarian masses seen on CT. She was transferred to [facility] from [facility] for further evaluation and treatment. She reported a history of endometriosis and infertility but no other gynecologic history. Her last pap smear was multiple years prior to presentation and she denied a history of dysplasia along with abnormal uterine bleeding.  Preop Labs and Imaging:  CA 125: 554  CEA: 0.9  CA 19-9: 587  CT 11/09/14: IMPRESSION: 1. Large multiloculated partially cystic partially solid mass replacing the left ovary. The right ovary is also enlarged, heterogeneous, and abnormal in appearance with the inferior margin immediately abutting this mass lesion. Despite trace fat plane along the superior aspect of the right ovary and cystic/solid mass, the right ovary is markedly abnormal in appearance and certainly involved. These findings are concerning for a primary ovarian malignancy involving both ovaries. A pelvic ultrasound has been ordered the  time of examination and may be useful in further evaluation of these pelvic masses. 2. The base of the appendix is slightly dilated and demonstrates hyperenhancement of the wall. Additionally, the lateral aspect of the base the appendix immediately abuts the right side of the pelvic mass. These findings may reflect appendiceal involvement. 3. Minimal abutment of the sigmoid colon with relatively preserved fat plane throughout. No differential dilatation of the sigmoid colon or obvious tethering to suggest sigmoid involvement. However, no discrete fat plane is seen within a short segment as described above. 4. Trace ascites. No hydronephrosis or omental caking. 5. Minimal perihepatic free fluid as well less enlarged portacaval lymph node node. 6. Subtle posterior right hepatic dome hypodensity is too small to characterize. While this lesion does not have the typical appearance of a metastasis, this finding remains indeterminate on this suboptimally timed study. If clinically indicated, a MRI of the abdomen without and with contrast may be obtained for further evaluation.  On 11/10/14, she underwent an exploratory laparotomy and radical tumor debulking with lysis of adhesions for 90 minutes and bilateral ureterolysis for retroperitoneal fibrosis by Dr. Thurston Pounds at [facility].  Operative findings included:  1. Chocolate colored ascites immediately noted upon abdominal entry consistent with ruptured endometrioma  2. Bilateral ovarian masses with chocolate fluid, left side measuring ~12cm with ~8cm solid component, right side measuring ~6cm (frozen section of the left ovary showing invasive carcinoma concerning for clear cell)  3. 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 10 cm fibroid uterus with normal cervix to palpation  5. Normal abdominal survey with normal appearing omentum, normal appearing appendix retroperitoneal and  running cephalad along the cecum, normal small and large bowel without evidence of cancer spread, smooth diaphragms bilaterally, smooth liver edge with hepatomegaly, normal stomach and normal kidneys on palpation  6. Sigmoid colon densely adherent to the uterus and posterior cervix precluding cervical excision  7. No pathologically enlarged lymph nodes but discolored shotty left obturator and aortic lymph nodes excised  8. R0 resection with no gross residual cancer at surgery close.  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 (please see synoptic report)  - Endometriosis  Left fallopian tube:  - Negative for involvement by clear cell carcinoma  B: Ovary and tube, right salpingo-oophorectomy  Right ovary:  - Ovarian clear cell carcinoma (please see synoptic report)  - Endometriosis  Right fallopian tube:  - Negative for involvement by clear cell carcinoma  - Endometriosis  C: Uterus, supracervical hysterectomy  - Negative for involvement by clear cell carcinoma  Additional findings:  Endometrium: - Weakly proliferative endometrium  - No hyperplasia or carcinoma identified  Myometrium:  - Extensive adenomyosis  D: Lymph node, aortic, regional resection  - One lymph node negative for metastatic carcinoma (0/1)  E: Lymph node, right pelvic, regional resection  - Four lymph nodes negative for metastatic carcinoma (0/4)  F: Lymph node, left pelvic, regional resection  - Two lymph nodes negative for metastatic carcinoma (0/2)  G: Omentum, omentectomy  - Negative for involvement by clear cell carcinoma  - Mature adipose tissue  H: Peritoneum, biopsy  - Negative for involvement by clear cell carcinoma  - Fibroadipose tissue  A: Pelvic washing  - No malignant cells identified (see comment)  B: Diaphragm, brushing/scraping  - No malignant cells identified  - Reactive mesothelial  cells  Interval History:   She went on to receive 6 cycles of adjuvant carboplatin and paclitaxel chemotherapy completed in November, 2016.  CA 125 stable at 24.3 in September, 2018 The patient presents today with her significant other for surveillance visit.   She has had some abdominal pains and unexplained emesis.   She is tolerating the vivelle dot much better - low libido.   Chemo brain is very bothersome and making it hard for her to study in school. She cannot focus on work. Minimal improvement on adderall 5mg  BID.  Review of Systems  Constitutional: Feels well.  No fever, chills, early satiety, unintentional weight loss or gain.  Appetite improving.  Cardiovascular: No chest pain, shortness of breath, or edema.  Pulmonary: No cough or wheeze.  Gastrointestinal: No nausea, vomiting, or diarrhea. No bright red blood per rectum or change in bowel movement.  Genitourinary: No frequency, urgency. No vaginal bleeding or discharge.  Musculoskeletal: No myalgia or joint pain. Neurologic: No weakness, numbness, or change in gait.  Psychology: No depression, anxiety, or insomnia.  Current Meds:  Outpatient Encounter Medications as of 10/06/2017  Medication Sig  . albuterol (PROVENTIL HFA;VENTOLIN HFA) 108 (90 Base) MCG/ACT inhaler TAKE 2 PUFFS BY MOUTH EVERY 6 HOURS AS NEEDED FOR WHEEZE OR SHORTNESS OF BREATH  . ALPRAZolam (XANAX) 1 MG tablet Take 1 tablet (1 mg total) by mouth 2 (two) times daily as needed. for anxiety  . amphetamine-dextroamphetamine (ADDERALL) 5 MG tablet Take 1 tablet (5 mg total) by mouth 2 (two) times daily with a meal.  . [START ON 10/09/2017] estradiol (VIVELLE-DOT) 0.05 MG/24HR patch Place 1 patch (0.05 mg total) onto the skin 2 (two) times a week.  Marland Kitchen  fluticasone (FLONASE) 50 MCG/ACT nasal spray Place 2 sprays into both nostrils daily.  Marland Kitchen levothyroxine (SYNTHROID, LEVOTHROID) 50 MCG tablet Take 1 tablet (50 mcg total) by mouth daily before breakfast.  .  lisinopril (PRINIVIL,ZESTRIL) 5 MG tablet TAKE 1 TABLET BY MOUTH ONCE DAILY  . meloxicam (MOBIC) 15 MG tablet TAKE ONE TABLET BY MOUTH ONCE DAILY AS NEEDED FOR PAIN  . montelukast (SINGULAIR) 10 MG tablet Take 1 tablet (10 mg total) by mouth at bedtime.  . pantoprazole (PROTONIX) 40 MG tablet TAKE 1 TABLET BY MOUTH TWICE A DAY FOR 2 WEEKS, THEN TAKE 1 TABLET ONCE DAILY  . sodium chloride (OCEAN) 0.65 % SOLN nasal spray Place 1 spray into both nostrils daily as needed for congestion.  . [DISCONTINUED] amphetamine-dextroamphetamine (ADDERALL) 5 MG tablet Take 1 tablet (5 mg total) by mouth 2 (two) times daily with a meal.  . [DISCONTINUED] amphetamine-dextroamphetamine (ADDERALL) 5 MG tablet Take 1 tablet (5 mg total) by mouth 2 (two) times daily with a meal.  . [DISCONTINUED] estradiol (VIVELLE-DOT) 0.05 MG/24HR patch Place 1 patch (0.05 mg total) onto the skin 2 (two) times a week.  . cyclobenzaprine (FLEXERIL) 5 MG tablet Take 2 tablets (10 mg total) by mouth 3 (three) times daily.  Marland Kitchen escitalopram (LEXAPRO) 10 MG tablet Take 1 tablet (10 mg total) by mouth daily.  Marland Kitchen gabapentin (NEURONTIN) 600 MG tablet Take 2 tablets (1,200 mg total) by mouth 3 (three) times daily. (Patient not taking: Reported on 10/06/2017)  . [DISCONTINUED] cefdinir (OMNICEF) 300 MG capsule Take 1 capsule (300 mg total) by mouth 2 (two) times daily. (Patient not taking: Reported on 10/06/2017)  . [DISCONTINUED] Cyanocobalamin (VITAMIN B-12 PO) Take 2 tablets by mouth daily.  . [DISCONTINUED] predniSONE (DELTASONE) 10 MG tablet 3 tabs by mouth per day for 3 days,2tabs per day for 3 days,1tab per day for 3 days (Patient not taking: Reported on 10/06/2017)  . [DISCONTINUED] tetrahydrozoline-zinc (VISINE-AC) 0.05-0.25 % ophthalmic solution Place 2 drops into both eyes 3 (three) times daily as needed (dry eyes).   No facility-administered encounter medications on file as of 10/06/2017.     Allergy:  Allergies  Allergen Reactions  .  Moxifloxacin Anaphylaxis    needs epinephrine shot  . Quinolones Anaphylaxis, Shortness Of Breath and Swelling  . Doxycycline Nausea And Vomiting  . Escitalopram Oxalate Other (See Comments)     fatigue  . Sulfonamide Derivatives Swelling and Rash    needs epinephrine shot--rash and lip swelling    Social Hx:   Social History   Socioeconomic History  . Marital status: Divorced    Spouse name: Not on file  . Number of children: 0  . Years of education: Not on file  . Highest education level: Not on file  Occupational History  . Not on file  Social Needs  . Financial resource strain: Not on file  . Food insecurity:    Worry: Not on file    Inability: Not on file  . Transportation needs:    Medical: Not on file    Non-medical: Not on file  Tobacco Use  . Smoking status: Never Smoker  . Smokeless tobacco: Never Used  Substance and Sexual Activity  . Alcohol use: Yes    Alcohol/week: 0.0 oz    Comment: Rarely  . Drug use: No  . Sexual activity: Not on file  Lifestyle  . Physical activity:    Days per week: Not on file    Minutes per session: Not on file  .  Stress: Not on file  Relationships  . Social connections:    Talks on phone: Not on file    Gets together: Not on file    Attends religious service: Not on file    Active member of club or organization: Not on file    Attends meetings of clubs or organizations: Not on file    Relationship status: Not on file  . Intimate partner violence:    Fear of current or ex partner: Not on file    Emotionally abused: Not on file    Physically abused: Not on file    Forced sexual activity: Not on file  Other Topics Concern  . Not on file  Social History Narrative   Lives with boyfriend in a one story home.  Has no children.     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     Education: college.    Past Surgical Hx:  Past Surgical History:  Procedure Laterality Date  . ABDOMINAL HYSTERECTOMY  11/10/14   at [facility], Exp lap, supracervical  hyst, BSO, infracolic omentectomy, lymphadenectomy, aortic lymph node sampling  . IR GENERIC HISTORICAL  05/12/2016   IR REMOVAL TUN ACCESS W/ PORT W/O FL MOD SED 05/12/2016 [facility]INTER[facility] RAD    Past Medical Hx:  Past Medical History:  Diagnosis Date  . Anxiety   . Elevated blood pressure, situational 05/05/2016  . Endometriosis   . Exercise-induced asthma 09/08/2015  . Ovarian cancer, bilateral ([place]) 11/28/2014  . PONV (postoperative nausea and vomiting)   . Thyroid disease     Family Hx:  Family History  Problem Relation Age of Onset  . Hypertension Mother   . Diabetes Mother   . Stroke Mother        Deceased  . Atrial fibrillation Father        Living  . Diabetes Maternal Aunt   . Heart failure Maternal Grandmother   . Heart attack Maternal Grandfather   . Dementia Paternal Grandmother   . Dementia Paternal Grandfather   . Healthy Brother     Vitals:  Blood pressure 106/65, pulse 88, temperature 98.1 F (36.7 C), temperature source Oral, resp. rate 20, height 5\' 8"  (1.727 m), weight (!) 316 lb 8 oz (143.6 kg), last menstrual period 11/02/2014, SpO2 100 %.  Physical Exam:  General: Well developed, well nourished female in no acute distress. Alert and oriented x 3.  Cardiovascular: Regular rate and rhythm. S1 and S2 normal.  Lungs: Clear to auscultation bilaterally. No wheezes/crackles/rhonchi noted.  Skin: No rashes or lesions present. Back: No CVA tenderness.  Abdomen: Abdomen soft, non-tender and morbidly obese. Active bowel sounds in all quadrants. No masses Genitourinary: bimanual exam reveals a cervix with no pelvic masses. Cervix normal. Extremities: Edema noted bilaterally, slightly more in the left lower extremity but non-pitting.  No erythema or tenderness noted with the LLE.  Negative Homans sign with the LLE.  No bilateral cyanosis or clubbing.    Thereasa Solo, MD 10/06/2017, 4:11 PM

## 2017-10-07 LAB — CA 125: CANCER ANTIGEN (CA) 125: 21.7 U/mL (ref 0.0–38.1)

## 2017-10-11 ENCOUNTER — Ambulatory Visit (HOSPITAL_COMMUNITY)
Admission: RE | Admit: 2017-10-11 | Discharge: 2017-10-11 | Disposition: A | Discharge status: Home / Self Care | Payer: 59 | Source: Ambulatory Visit | Attending: Gynecologic Oncology | Admitting: Gynecologic Oncology

## 2017-10-11 ENCOUNTER — Encounter (HOSPITAL_COMMUNITY): Payer: Self-pay

## 2017-10-11 DIAGNOSIS — Z08 Encounter for follow-up examination after completed treatment for malignant neoplasm: Secondary | ICD-10-CM | POA: Diagnosis not present

## 2017-10-11 DIAGNOSIS — Z8543 Personal history of malignant neoplasm of ovary: Secondary | ICD-10-CM | POA: Insufficient documentation

## 2017-10-11 DIAGNOSIS — C569 Malignant neoplasm of unspecified ovary: Secondary | ICD-10-CM

## 2017-10-11 MED ORDER — IOPAMIDOL (ISOVUE-300) INJECTION 61%
100.0000 mL | Freq: Once | INTRAVENOUS | Status: AC | PRN
  Administered 2017-10-11: 100 mL via INTRAVENOUS

## 2017-10-11 MED ORDER — IOPAMIDOL (ISOVUE-300) INJECTION 61%
INTRAVENOUS | Status: AC
  Filled 2017-10-11: qty 100

## 2017-10-16 ENCOUNTER — Telehealth: Payer: Self-pay

## 2017-10-16 ENCOUNTER — Ambulatory Visit: Payer: 59 | Admitting: Internal Medicine

## 2017-10-16 ENCOUNTER — Encounter: Payer: Self-pay | Admitting: Internal Medicine

## 2017-10-16 ENCOUNTER — Other Ambulatory Visit (INDEPENDENT_AMBULATORY_CARE_PROVIDER_SITE_OTHER): Payer: 59

## 2017-10-16 VITALS — BP 124/82 | HR 95 | Temp 98.7°F | Ht 68.0 in | Wt 312.0 lb

## 2017-10-16 DIAGNOSIS — R232 Flushing: Secondary | ICD-10-CM

## 2017-10-16 DIAGNOSIS — G4700 Insomnia, unspecified: Secondary | ICD-10-CM | POA: Diagnosis not present

## 2017-10-16 DIAGNOSIS — E039 Hypothyroidism, unspecified: Secondary | ICD-10-CM

## 2017-10-16 DIAGNOSIS — Z9989 Dependence on other enabling machines and devices: Secondary | ICD-10-CM

## 2017-10-16 DIAGNOSIS — R739 Hyperglycemia, unspecified: Secondary | ICD-10-CM | POA: Insufficient documentation

## 2017-10-16 DIAGNOSIS — Z0001 Encounter for general adult medical examination with abnormal findings: Secondary | ICD-10-CM

## 2017-10-16 DIAGNOSIS — J309 Allergic rhinitis, unspecified: Secondary | ICD-10-CM

## 2017-10-16 DIAGNOSIS — G4733 Obstructive sleep apnea (adult) (pediatric): Secondary | ICD-10-CM

## 2017-10-16 DIAGNOSIS — L309 Dermatitis, unspecified: Secondary | ICD-10-CM

## 2017-10-16 LAB — URINALYSIS, ROUTINE W REFLEX MICROSCOPIC
Bilirubin Urine: NEGATIVE
Hgb urine dipstick: NEGATIVE
Ketones, ur: NEGATIVE
Leukocytes, UA: NEGATIVE
Nitrite: NEGATIVE
PH: 6.5 (ref 5.0–8.0)
RBC / HPF: NONE SEEN (ref 0–?)
SPECIFIC GRAVITY, URINE: 1.015 (ref 1.000–1.030)
Total Protein, Urine: NEGATIVE
Urine Glucose: NEGATIVE
Urobilinogen, UA: 0.2 (ref 0.0–1.0)

## 2017-10-16 LAB — HEPATIC FUNCTION PANEL
ALT: 18 U/L (ref 0–35)
AST: 14 U/L (ref 0–37)
Albumin: 4.3 g/dL (ref 3.5–5.2)
Alkaline Phosphatase: 60 U/L (ref 39–117)
BILIRUBIN DIRECT: 0.1 mg/dL (ref 0.0–0.3)
BILIRUBIN TOTAL: 0.9 mg/dL (ref 0.2–1.2)
Total Protein: 7.5 g/dL (ref 6.0–8.3)

## 2017-10-16 LAB — CBC WITH DIFFERENTIAL/PLATELET
BASOS PCT: 0.4 % (ref 0.0–3.0)
Basophils Absolute: 0 10*3/uL (ref 0.0–0.1)
Eosinophils Absolute: 0.1 10*3/uL (ref 0.0–0.7)
Eosinophils Relative: 1.4 % (ref 0.0–5.0)
HCT: 39.4 % (ref 36.0–46.0)
HEMOGLOBIN: 13.5 g/dL (ref 12.0–15.0)
Lymphocytes Relative: 27.3 % (ref 12.0–46.0)
Lymphs Abs: 2.8 10*3/uL (ref 0.7–4.0)
MCHC: 34.2 g/dL (ref 30.0–36.0)
MCV: 88.8 fl (ref 78.0–100.0)
MONO ABS: 0.4 10*3/uL (ref 0.1–1.0)
Monocytes Relative: 4.3 % (ref 3.0–12.0)
Neutro Abs: 6.7 10*3/uL (ref 1.4–7.7)
Neutrophils Relative %: 66.6 % (ref 43.0–77.0)
Platelets: 273 10*3/uL (ref 150.0–400.0)
RBC: 4.44 Mil/uL (ref 3.87–5.11)
RDW: 13.1 % (ref 11.5–15.5)
WBC: 10.1 10*3/uL (ref 4.0–10.5)

## 2017-10-16 LAB — BASIC METABOLIC PANEL
BUN: 11 mg/dL (ref 6–23)
CHLORIDE: 101 meq/L (ref 96–112)
CO2: 29 mEq/L (ref 19–32)
Calcium: 9.4 mg/dL (ref 8.4–10.5)
Creatinine, Ser: 0.71 mg/dL (ref 0.40–1.20)
GFR: 93.98 mL/min (ref >60.00)
Glucose, Bld: 156 mg/dL — ABNORMAL HIGH (ref 70–99)
POTASSIUM: 3.4 meq/L — AB (ref 3.5–5.1)
Sodium: 137 mEq/L (ref 135–145)

## 2017-10-16 LAB — LIPID PANEL
Cholesterol: 171 mg/dL (ref 0–200)
HDL: 39.1 mg/dL (ref >39.00)
LDL CALC: 112 mg/dL — AB (ref 0–99)
NONHDL: 131.89
Total CHOL/HDL Ratio: 4
Triglycerides: 97 mg/dL (ref 0.0–149.0)
VLDL: 19.4 mg/dL (ref 0.0–40.0)

## 2017-10-16 LAB — TSH: TSH: 5.12 u[IU]/mL — AB (ref 0.35–4.50)

## 2017-10-16 LAB — HEMOGLOBIN A1C: HEMOGLOBIN A1C: 5.7 % (ref 4.6–6.5)

## 2017-10-16 MED ORDER — ZOLPIDEM TARTRATE 10 MG PO TABS: 10.0000 mg | ORAL_TABLET | Freq: Every evening | ORAL | 1 refills | Status: DC | PRN

## 2017-10-16 MED ORDER — SERTRALINE HCL 100 MG PO TABS: 100.0000 mg | ORAL_TABLET | Freq: Every day | ORAL | 3 refills | Status: DC

## 2017-10-16 MED ORDER — METHYLPREDNISOLONE ACETATE 80 MG/ML IJ SUSP
80.0000 mg | Freq: Once | INTRAMUSCULAR | Status: AC
  Administered 2017-10-16: 80 mg via INTRAMUSCULAR

## 2017-10-16 MED ORDER — CLOBETASOL PROPIONATE 0.05 % EX CREA: 1.0000 "application " | TOPICAL_CREAM | Freq: Two times a day (BID) | CUTANEOUS | 1 refills | Status: DC

## 2017-10-16 NOTE — Telephone Encounter (Signed)
Pt returned our call regarding recent CT abd/pelvis results per Joylene John NP- "no evidence of cancer, 2 left kidney stones noted but not blocking anything". Pt voiced understanding.  Per Lenna Sciara NP if pt starts having any symptoms of kidney stones -discussed- then follow up with PCP especially then and can follow up before at her annual physical or next appt.  Pt voiced understanding and no other needs per pt at this time.

## 2017-10-16 NOTE — Assessment & Plan Note (Signed)
Also for tsh with labs 

## 2017-10-16 NOTE — Assessment & Plan Note (Signed)
Ok for [employer] 10 qhs prn

## 2017-10-16 NOTE — Assessment & Plan Note (Signed)
Ok for triam cr prn,  to f/u any worsening symptoms or concerns 

## 2017-10-16 NOTE — Telephone Encounter (Signed)
Attempted to reach pt for results of her CT abd /pelvis per Joylene John NP- no answer, left VM to return our call.

## 2017-10-16 NOTE — Assessment & Plan Note (Signed)

## 2017-10-16 NOTE — Progress Notes (Signed)
Subjective:    Patient ID: [name], female    DOB: 28-Nov-1970, 47 y.o.   MRN: [id number]  HPI   Here for wellness and f/u;  Overall doing ok;  Pt denies Chest pain, worsening SOB, DOE, wheezing, orthopnea, PND, worsening LE edema, palpitations, dizziness or syncope.  Pt denies neurological change such as new headache, facial or extremity weakness. Pt states overall good compliance with treatment and medications, good tolerability, and has been trying to follow appropriate diet  Has gained wt related to chronic left ankle, right knee pains.  No fever, night sweats, wt loss, loss of appetite, or other constitutional symptoms.  Pt states good ability with ADL's, has low fall risk, home safety reviewed and adequate, no other significant changes in hearing or vision, and only occasionally active with exercise. Also Does have several wks ongoing nasal allergy symptoms with clearish congestion, itch and sneezing, without fever, pain, ST, cough, swelling or wheezing, despite Claritin D and flonase for allergies.   Pt denies polydipsia, polyuria.  Pt states overall good compliance with meds, trying to follow lower cholesterol, diabetic diet, wt overall stable but little exercise however.  Asks for nutrition referral Lexapro not working well for post menopausal hot flashes, Denies worsening depressive symptoms, suicidal ideation, or panic; has ongoing anxiety Has good CPAP compliance Has persistent insomnia last 2 months, just cant get to sleep until 1 am.   Also has worsening eczema to both arms with itching.   Past Medical History:  Diagnosis Date  . Anxiety   . Elevated blood pressure, situational 05/05/2016  . Endometriosis   . Exercise-induced asthma 09/08/2015  . Ovarian cancer, bilateral ([place]) 11/28/2014  . PONV (postoperative nausea and vomiting)   . Thyroid disease    Past Surgical History:  Procedure Laterality Date  . ABDOMINAL HYSTERECTOMY  11/10/14   at [facility], Exp lap, supracervical  hyst, BSO, infracolic omentectomy, lymphadenectomy, aortic lymph node sampling  . IR GENERIC HISTORICAL  05/12/2016   IR REMOVAL TUN ACCESS W/ PORT W/O FL MOD SED 05/12/2016 [facility]    reports that she has never smoked. She has never used smokeless tobacco. She reports that she drinks alcohol. She reports that she does not use drugs. family history includes Atrial fibrillation in her father; Dementia in her paternal grandfather and paternal grandmother; Diabetes in her maternal aunt and mother; Healthy in her brother; Heart attack in her maternal grandfather; Heart failure in her maternal grandmother; Hypertension in her mother; Stroke in her mother. Allergies  Allergen Reactions  . Moxifloxacin Anaphylaxis    needs epinephrine shot  . Quinolones Anaphylaxis, Shortness Of Breath and Swelling  . Doxycycline Nausea And Vomiting  . Escitalopram Oxalate Other (See Comments)     fatigue  . Sulfonamide Derivatives Swelling and Rash    needs epinephrine shot--rash and lip swelling   Current Outpatient Medications on File Prior to Visit  Medication Sig Dispense Refill  . albuterol (PROVENTIL HFA;VENTOLIN HFA) 108 (90 Base) MCG/ACT inhaler TAKE 2 PUFFS BY MOUTH EVERY 6 HOURS AS NEEDED FOR WHEEZE OR SHORTNESS OF BREATH 8.5 Inhaler 0  . ALPRAZolam (XANAX) 1 MG tablet Take 1 tablet (1 mg total) by mouth 2 (two) times daily as needed. for anxiety 60 tablet 5  . amphetamine-dextroamphetamine (ADDERALL) 5 MG tablet Take 1 tablet (5 mg total) by mouth 2 (two) times daily with a meal. 60 tablet 0  . cyclobenzaprine (FLEXERIL) 5 MG tablet Take 2 tablets (10 mg total) by mouth 3 (three)  times daily. 30 tablet 1  . estradiol (VIVELLE-DOT) 0.05 MG/24HR patch Place 1 patch (0.05 mg total) onto the skin 2 (two) times a week. 8 patch 12  . fluticasone (FLONASE) 50 MCG/ACT nasal spray Place 2 sprays into both nostrils daily. 16 g 6  . gabapentin (NEURONTIN) 600 MG tablet Take 2 tablets (1,200 mg total) by mouth 3  (three) times daily. 540 tablet 1  . levothyroxine (SYNTHROID, LEVOTHROID) 50 MCG tablet Take 1 tablet (50 mcg total) by mouth daily before breakfast. 90 tablet 3  . lisinopril (PRINIVIL,ZESTRIL) 5 MG tablet TAKE 1 TABLET BY MOUTH ONCE DAILY 90 tablet 0  . meloxicam (MOBIC) 15 MG tablet TAKE ONE TABLET BY MOUTH ONCE DAILY AS NEEDED FOR PAIN 30 tablet 2  . montelukast (SINGULAIR) 10 MG tablet Take 1 tablet (10 mg total) by mouth at bedtime. 90 tablet 3  . pantoprazole (PROTONIX) 40 MG tablet TAKE 1 TABLET BY MOUTH TWICE A DAY FOR 2 WEEKS, THEN TAKE 1 TABLET ONCE DAILY 30 tablet 5  . sodium chloride (OCEAN) 0.65 % SOLN nasal spray Place 1 spray into both nostrils daily as needed for congestion.     No current facility-administered medications on file prior to visit.    Review of Systems Constitutional: Negative for other unusual diaphoresis, sweats, appetite or weight changes HENT: Negative for other worsening hearing loss, ear pain, facial swelling, mouth sores or neck stiffness.   Eyes: Negative for other worsening pain, redness or other visual disturbance.  Respiratory: Negative for other stridor or swelling Cardiovascular: Negative for other palpitations or other chest pain  Gastrointestinal: Negative for worsening diarrhea or loose stools, blood in stool, distention or other pain Genitourinary: Negative for hematuria, flank pain or other change in urine volume.  Musculoskeletal: Negative for myalgias or other joint swelling.  Skin: Negative for other color change, or other wound or worsening drainage.  Neurological: Negative for other syncope or numbness. Hematological: Negative for other adenopathy or swelling Psychiatric/Behavioral: Negative for hallucinations, other worsening agitation, SI, self-injury, or new decreased concentration All other system neg per pt    Objective:   Physical Exam BP 124/82   Pulse 95   Temp 98.7 F (37.1 C) (Oral)   Ht 5\' 8"  (1.727 m)   Wt (!) 312 lb  (141.5 kg)   LMP 11/02/2014   SpO2 97%   BMI 47.44 kg/m  VS noted,  Constitutional: Pt is oriented to person, place, and time. Appears well-developed and well-nourished, in no significant distress and comfortable Head: Normocephalic and atraumatic  Eyes: Conjunctivae and EOM are normal. Pupils are equal, round, and reactive to light Right Ear: External ear normal without discharge Left Ear: External ear normal without discharge Nose: Nose without discharge or deformity Mouth/Throat: Oropharynx is without other ulcerations and moist  Neck: Normal range of motion. Neck supple. No JVD present. No tracheal deviation present or significant neck LA or mass Cardiovascular: Normal rate, regular rhythm, normal heart sounds and intact distal pulses.   Pulmonary/Chest: WOB normal and breath sounds without rales or wheezing  Abdominal: Soft. Bowel sounds are normal. NT. No HSM  Musculoskeletal: Normal range of motion. Exhibits no edema Lymphadenopathy: Has no other cervical adenopathy.  Neurological: Pt is alert and oriented to person, place, and time. Pt has normal reflexes. No cranial nerve deficit. Motor grossly intact, Gait intact Skin: Skin is warm and dry. No rash noted or new ulcerations Psychiatric:  Has marked nervous mood and affect. Behavior is normal without agitation No  other exam findings     Assessment & Plan:

## 2017-10-16 NOTE — Assessment & Plan Note (Addendum)
Also for a1c with labs, for nutrition counseling, cont to work on diet and wt control  Lab Results  Component Value Date   HGBA1C 5.7 10/16/2017  \ In addition to the time spent performing CPE, I spent an additional 25 minutes face to face,in which greater than 50% of this time was spent in counseling and coordination of care for patient's acute illness as documented, including the differential dx, treatment, further evaluation and other management of hyperglycemia, allergic rhinitis, hypothyroid, OSA, eczema, hot flashes, insomnia

## 2017-10-16 NOTE — Assessment & Plan Note (Signed)
Good compliance, cont same tx

## 2017-10-16 NOTE — Assessment & Plan Note (Signed)
Ok to change lexapro to zoloft asd,  to f/u any worsening symptoms or concerns

## 2017-10-16 NOTE — Patient Instructions (Addendum)
You had the steroid shot today  OK to stay off the lexapro  Please take all new medication as prescribed - the zoloft, ambien, and steroid cream  Please continue all other medications as before, and refills have been done if requested.  Please have the pharmacy call with any other refills you may need.  Please continue your efforts at being more active, low cholesterol diet, and weight control.  You are otherwise up to date with prevention measures today.  Please keep your appointments with your specialists as you may have planned  You will be contacted regarding the referral for: Medical Nutrition  Please go to the LAB in the Basement (turn left off the elevator) for the tests to be done today  You will be contacted by phone if any changes need to be made immediately.  Otherwise, you will receive a letter about your results with an explanation, but please check with MyChart first.  Please remember to sign up for MyChart if you have not done so, as this will be important to you in the future with finding out test results, communicating by private email, and scheduling acute appointments online when needed.  Please return in 1 year for your yearly visit, or sooner if needed, with Lab testing done 3-5 days before

## 2017-10-16 NOTE — Assessment & Plan Note (Signed)
Mild to mod, for depomedrol IM 80,  to f/u any worsening symptoms or concerns 

## 2017-10-18 ENCOUNTER — Encounter: Payer: Self-pay | Admitting: Internal Medicine

## 2017-10-18 DIAGNOSIS — E039 Hypothyroidism, unspecified: Secondary | ICD-10-CM

## 2017-10-18 MED ORDER — LEVOTHYROXINE SODIUM 75 MCG PO TABS: 75.0000 ug | ORAL_TABLET | Freq: Every day | ORAL | 3 refills | Status: DC

## 2017-10-18 NOTE — Telephone Encounter (Signed)
shirron to contact pt - see mychart note to inform pt today

## 2017-11-22 ENCOUNTER — Ambulatory Visit: Payer: Self-pay | Admitting: *Deleted

## 2017-12-11 ENCOUNTER — Other Ambulatory Visit: Payer: Self-pay | Admitting: Internal Medicine

## 2017-12-11 DIAGNOSIS — R0981 Nasal congestion: Secondary | ICD-10-CM

## 2017-12-27 ENCOUNTER — Other Ambulatory Visit: Payer: Self-pay | Admitting: Neurology

## 2018-01-22 ENCOUNTER — Encounter: Payer: Self-pay | Admitting: Internal Medicine

## 2018-01-22 DIAGNOSIS — R41840 Attention and concentration deficit: Secondary | ICD-10-CM

## 2018-01-23 ENCOUNTER — Telehealth: Payer: Self-pay

## 2018-01-23 ENCOUNTER — Other Ambulatory Visit: Payer: Self-pay | Admitting: Gynecologic Oncology

## 2018-01-23 DIAGNOSIS — R41840 Attention and concentration deficit: Secondary | ICD-10-CM

## 2018-01-23 MED ORDER — AMPHETAMINE-DEXTROAMPHETAMINE 5 MG PO TABS: 5.0000 mg | ORAL_TABLET | Freq: Two times a day (BID) | ORAL | 0 refills | Status: DC

## 2018-01-23 NOTE — Telephone Encounter (Signed)
Incoming call from patient requesting letter for her school from Dr Denman George. Pt verbalized she is going to college for psychology, possibly social work and would like to help with survivors one day.  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  No other needs per pt at this time.

## 2018-01-24 ENCOUNTER — Telehealth: Payer: Self-pay

## 2018-01-24 NOTE — Telephone Encounter (Signed)
Told [name] that Dr. Denman George is not comfortable writting this letter. She recommends that PCP refer her to neuropsychology for further evaluation of her symptoms.

## 2018-01-30 NOTE — Addendum Note (Signed)
Addended by: Biagio Borg on: 01/30/2018 08:31 PM   Modules accepted: Orders

## 2018-01-30 NOTE — Telephone Encounter (Signed)
Referral done

## 2018-01-31 ENCOUNTER — Telehealth: Payer: Self-pay | Admitting: Internal Medicine

## 2018-01-31 NOTE — Telephone Encounter (Signed)
-----   Message from Merril Abbe, [place] sent at 01/31/2018  1:03 PM EDT ----- FYI:  We called your patient and she has declined to schedule an appointment with [place].  She said she needs ADD testing and she needs a sooner appointment than we can offer (Jan. 2020).  We appreciate the referral.

## 2018-01-31 NOTE — Telephone Encounter (Signed)
GYN-ON[facility] doctor suggested patient be referred to Neuropsychology and asked for PCP to do referral,  Please advise can you do referral for this

## 2018-02-10 ENCOUNTER — Other Ambulatory Visit: Payer: Self-pay | Admitting: Internal Medicine

## 2018-02-19 ENCOUNTER — Ambulatory Visit: Payer: Self-pay | Admitting: *Deleted

## 2018-02-19 NOTE — Telephone Encounter (Signed)
Pt called with complaints of dizziness, sinus pressure, right ear pain, and blurred vision and; the right ear pain/ringing starting 02/14/18; she also has sinus pressure above cheek bones/underneath eyes, says her "cheek bones feel tight"' the pt said on Saturday night 02/17/18; she had a fluttering feeling in her chest; she is most concerned with dizziness; the pt has drainage which she thinks has been making her throat hurt once and a while; recommendations made per nurse triage protocol; the pt normally sees Dr [name] but he has no availability within parameters per guidelines; pt offered and accepted appointment with [name], [place], 02/20/18 at 1020; she verbalized understanding; will route to office for notification of this upcoming appointment.      Reason for Disposition . [1] MODERATE dizziness (e.g., interferes with normal activities) AND [2] has NOT been evaluated by physician for this  (Exception: dizziness caused by heat exposure, sudden standing, or poor fluid intake) . [1] Sinus congestion as part of a cold AND [2] present < 10 days  Answer Assessment - Initial Assessment Questions 1. DESCRIPTION: "Describe your dizziness."     Light headed when she tries to stand up 2. LIGHTHEADED: "Do you feel lightheaded?" (e.g., somewhat faint, woozy, weak upon standing)     Woozy at times 3. VERTIGO: "Do you feel like either you or the room is spinning or tilting?" (i.e. vertigo)     Intermittent spinning 4. SEVERITY: "How bad is it?"  "Do you feel like you are going to faint?" "Can you stand and walk?"   - MILD - walking normally   - MODERATE - interferes with normal activities (e.g., work, school)    - SEVERE - unable to stand, requires support to walk, feels like passing out now.      moderate 5. ONSET:  "When did the dizziness begin?"     02/14/18 6. AGGRAVATING FACTORS: "Does anything make it worse?" (e.g., standing, change in head position)   Changing position of head   7. HEART  RATE: "Can you tell me your heart rate?" "How many beats in 15 seconds?"  (Note: not all patients can do this)      no 8. CAUSE: "What do you think is causing the dizziness?"     ears 9. RECURRENT SYMPTOM: "Have you had dizziness before?" If so, ask: "When was the last time?" "What happened that time?"   previously had inner ear infection in the past; feels like that 10. OTHER SYMPTOMS: "Do you have any other symptoms?" (e.g., fever, chest pain, vomiting, diarrhea, bleeding)       Loss of balance 11. PREGNANCY: "Is there any chance you are pregnant?" "When was your last menstrual period?"       No hysterectomy  Answer Assessment - Initial Assessment Questions 1. LOCATION: "Where does it hurt?"      Pain above cheek bones and below eyes; "cheek bones feel tight"; teeth sore 2. ONSET: "When did the sinus pain start?"  (e.g., hours, days)      02/14/18 3. SEVERITY: "How bad is the pain?"   (Scale 1-10; mild, moderate or severe)   - MILD (1-3): doesn't interfere with normal activities    - MODERATE (4-7): interferes with normal activities (e.g., work or school) or awakens from sleep   - SEVERE (8-10): excruciating pain and patient unable to do any normal activities        moderate 4. RECURRENT SYMPTOM: "Have you ever had sinus problems before?" If so, ask: "When was the  last time?" and "What happened that time?"      Always has sinus problems 5. NASAL CONGESTION: "Is the nose blocked?" If so, ask, "Can you open it or must you breathe through the mouth?"     yes 6. NASAL DISCHARGE: "Do you have discharge from your nose?" If so ask, "What color?"     none 7. FEVER: "Do you have a fever?" If so, ask: "What is it, how was it measured, and when did it start?"      no 8. OTHER SYMPTOMS: "Do you have any other symptoms?" (e.g., sore throat, cough, earache, difficulty breathing)     Sore throat, ear pain/ringing 9. PREGNANCY: "Is there any chance you are pregnant?" "When was your last menstrual  period?"     no  Protocols used: [place], SINUS PAIN OR CONGESTION-A-AH

## 2018-02-20 ENCOUNTER — Encounter: Payer: Self-pay | Admitting: Family

## 2018-02-20 ENCOUNTER — Ambulatory Visit (INDEPENDENT_AMBULATORY_CARE_PROVIDER_SITE_OTHER): Payer: 59 | Admitting: Family

## 2018-02-20 VITALS — BP 138/82 | Temp 98.1°F | Ht 68.0 in | Wt 309.1 lb

## 2018-02-20 DIAGNOSIS — J019 Acute sinusitis, unspecified: Secondary | ICD-10-CM

## 2018-02-20 DIAGNOSIS — R41840 Attention and concentration deficit: Secondary | ICD-10-CM

## 2018-02-20 DIAGNOSIS — R42 Dizziness and giddiness: Secondary | ICD-10-CM

## 2018-02-20 MED ORDER — AMOXICILLIN-POT CLAVULANATE 875-125 MG PO TABS: 1.0000 | ORAL_TABLET | Freq: Two times a day (BID) | ORAL | 0 refills | Status: DC

## 2018-02-20 NOTE — Patient Instructions (Addendum)
[email or web address]  [facility]

## 2018-02-20 NOTE — Progress Notes (Signed)
[name] is a 47 y.o. female with the following history as recorded in EpicCare:  Patient Active Problem List   Diagnosis Date Noted  . Hyperglycemia 10/16/2017  . Hot flash not due to menopause 10/16/2017  . Eczema 10/16/2017  . Flu-like symptoms 06/20/2017  . Cognitive changes 01/30/2017  . Disturbed concentration 01/26/2017  . Left shoulder pain 11/01/2016  . Right low back pain 11/01/2016  . Elevated blood pressure, situational 05/05/2016  . Surgical menopause 01/29/2016  . OSA on CPAP 12/22/2015  . Cough 11/26/2015  . Capsulitis 10/06/2015  . Ganglion cyst of left foot 10/06/2015  . Fatigue 09/13/2015  . Exercise-induced asthma 09/08/2015  . Exertional dyspnea 09/08/2015  . Peroneal ganglion cyst 07/20/2015  . Low back pain 07/20/2015  . Peroneal tendinitis of left lower leg 06/25/2015  . Nonallopathic lesion of cervical region 06/25/2015  . Wheezing 05/08/2015  . Polyarthralgia 05/08/2015  . Peripheral edema 03/16/2015  . Central line complication 09/32/6712  . Morbid obesity with BMI of 45.0-49.9, adult ([place]) 02/28/2015  . Chemotherapy induced neutropenia ([place]) 02/28/2015  . Genetic testing 01/05/2015  . Chemotherapy induced nausea and vomiting 01/02/2015  . Anxiety 01/02/2015  . 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 Premature surgical menopause 12/17/2014  . Leukopenia due to antineoplastic chemotherapy ([place]) 12/17/2014  . Port catheter in place 12/17/2014  . Encounter for antineoplastic chemotherapy 12/17/2014  . Myalgia 12/09/2014  . Hypersensitivity reaction 12/05/2014  . Ovarian cancer, bilateral ([place]) 11/28/2014  . Poor venous access 11/28/2014  . Postoperative cellulitis of surgical wound 11/24/2014  . Encounter for well adult exam with abnormal findings 09/10/2014  . Hypothyroidism 09/10/2014  . Hypersomnolence 09/10/2014  . Acute non-recurrent frontal sinusitis 06/24/2014  . Right knee pain 06/24/2014  . Lower back pain  01/08/2014  . EAR PAIN, BILATERAL 12/18/2009  . KNEE PAIN, RIGHT 12/18/2009  . INGROWN TOENAIL 12/17/2008  . BACK PAIN 12/17/2008  . Acute bronchospasm 11/11/2008  . Depression 11/29/2007  . Insomnia 11/29/2007  . Allergic rhinitis 04/13/2007  . GERD 04/13/2007  . ENDOMETRIOSIS 04/13/2007  . Migraine headache 01/29/2007    Current Outpatient Medications  Medication Sig Dispense Refill  . albuterol (PROVENTIL HFA;VENTOLIN HFA) 108 (90 Base) MCG/ACT inhaler TAKE 2 PUFFS BY MOUTH EVERY 6 HOURS AS NEEDED FOR WHEEZE OR SHORTNESS OF BREATH 8.5 Inhaler 0  . ALPRAZolam (XANAX) 1 MG tablet Take 1 tablet (1 mg total) by mouth 2 (two) times daily as needed. for anxiety 60 tablet 5  . amphetamine-dextroamphetamine (ADDERALL) 5 MG tablet Take 1 tablet (5 mg total) by mouth 2 (two) times daily with a meal. 60 tablet 0  . clobetasol cream (TEMOVATE) 4.58 % Apply 1 application topically 2 (two) times daily. 30 g 1  . estradiol (VIVELLE-DOT) 0.05 MG/24HR patch Place 1 patch (0.05 mg total) onto the skin 2 (two) times a week. 8 patch 12  . fluticasone (FLONASE) 50 MCG/ACT nasal spray Place 2 sprays into both nostrils daily. 16 g 6  . gabapentin (NEURONTIN) 600 MG tablet Take 2 tablets (1,200 mg total) by mouth 3 (three) times daily. 540 tablet 1  . levothyroxine (SYNTHROID, LEVOTHROID) 75 MCG tablet Take 1 tablet (75 mcg total) by mouth daily. 90 tablet 3  . lisinopril (PRINIVIL,ZESTRIL) 5 MG tablet TAKE 1 TABLET BY MOUTH ONCE DAILY 90 tablet 0  . meloxicam (MOBIC) 15 MG tablet TAKE 1 TABLET BY MOUTH ONCE DAILY AS NEEDED FOR PAIN 30 tablet 2  . montelukast (SINGULAIR) 10 MG tablet Take 1  tablet (10 mg total) by mouth at bedtime. 90 tablet 3  . pantoprazole (PROTONIX) 40 MG tablet TAKE 1 TABLET BY MOUTH TWICE A DAY FOR 2 WEEKS, THEN TAKE 1 TABLET ONCE DAILY 30 tablet 5  . sertraline (ZOLOFT) 100 MG tablet TAKE 1 TABLET BY MOUTH EVERY DAY 90 tablet 1  . sodium chloride (OCEAN) 0.65 % SOLN nasal spray Place 1  spray into both nostrils daily as needed for congestion.    Marland Kitchen amoxicillin-clavulanate (AUGMENTIN) 875-125 MG tablet Take 1 tablet by mouth 2 (two) times daily. 20 tablet 0  . zolpidem (AMBIEN) 10 MG tablet Take 1 tablet (10 mg total) by mouth at bedtime as needed for sleep. 90 tablet 1   No current facility-administered medications for this visit.     Allergies: Moxifloxacin; Quinolones; Doxycycline; Escitalopram oxalate; and Sulfonamide derivatives  Past Medical History:  Diagnosis Date  . Anxiety   . Elevated blood pressure, situational 05/05/2016  . Endometriosis   . Exercise-induced asthma 09/08/2015  . Ovarian cancer, bilateral ([place]) 11/28/2014  . PONV (postoperative nausea and vomiting)   . Thyroid disease     Past Surgical History:  Procedure Laterality Date  . ABDOMINAL HYSTERECTOMY  11/10/14   at [facility], Exp lap, supracervical hyst, BSO, infracolic omentectomy, lymphadenectomy, aortic lymph node sampling  . IR GENERIC HISTORICAL  05/12/2016   IR REMOVAL TUN ACCESS W/ PORT W/O FL MOD SED 05/12/2016 [facility]-INTERV RAD    Family History  Problem Relation Age of Onset  . Hypertension Mother   . Diabetes Mother   . Stroke Mother        Deceased  . Atrial fibrillation Father        Living  . Diabetes Maternal Aunt   . Heart failure Maternal Grandmother   . Heart attack Maternal Grandfather   . Dementia Paternal Grandmother   . Dementia Paternal Grandfather   . Healthy Brother     Social History   Tobacco Use  . Smoking status: Never Smoker  . Smokeless tobacco: Never Used  Substance Use Topics  . Alcohol use: Yes    Alcohol/week: 0.0 standard drinks    Comment: Rarely    Subjective:  Patient presents with concerns for possible sinus infection; symptoms x 1 week; has noticed that she feels "off balance" during this time as well; + pain localized in right ear; exposure to strep in the past week but denies any fever or sore throat; using OTC Flonase regularly; is prone to ear  infections- tubes placed as a child; no chest pain or shortness of breath;  Does mention that she has had a few episodes of blurred vision since being sick this past week- notes her eye exam is up to date.   Also would like to discuss options to help her get tested for possible ADD vs cognitive testing; feels that difficulty focusing at work since completing chemotherapy; affecting her ability to succeed in her classes. Requesting note for the past week since she has not felt well to attempt her classes/ tests with illness and chronic cognitive concerns.     Objective:  Vitals:   02/20/18 1033  BP: 138/82  Temp: 98.1 F (36.7 C)  TempSrc: Oral  SpO2: 96%  Weight: (!) 309 lb 1.3 oz (140.2 kg)  Height: 5\' 8"  (1.727 m)    General: Well developed, well nourished, in no acute distress  Skin : Warm and dry.  Head: Normocephalic and atraumatic  Eyes: Sclera and conjunctiva clear; pupils round and  reactive to light; extraocular movements intact  Ears: External normal; canals clear; tympanic membranes congested bilaterally  Oropharynx: Pink, supple. No suspicious lesions  Neck: Supple without thyromegaly, adenopathy  Lungs: Respirations unlabored; clear to auscultation bilaterally without wheeze, rales, rhonchi  CVS exam: normal rate and regular rhythm.  Neurologic: Alert and oriented; speech intact; face symmetrical; moves all extremities well; CNII-XII intact without focal deficit   Assessment:  1. Acute sinusitis, recurrence not specified, unspecified location   2. Dizziness   3. Attention deficit     Plan:  1. Rx for Augmentin 875 mg bid x 10 days; continue Flonase and saline rinse; increase fluids, rest and follow-up worse, no better. 2. Suspect acute symptoms due to underlying sinus infection; she is encouraged to discuss dizziness/ vision changes with her neurologist at upcoming appointment; per patient, she is up to date on eye exam. 3. Contact information for [employer]; she may also want to see what services [employer] can offer students.    No follow-ups on file.  No orders of the defined types were placed in this encounter.   Requested Prescriptions   Signed Prescriptions Disp Refills  . amoxicillin-clavulanate (AUGMENTIN) 875-125 MG tablet 20 tablet 0    Sig: Take 1 tablet by mouth 2 (two) times daily.

## 2018-02-21 ENCOUNTER — Encounter: Payer: Self-pay | Admitting: Internal Medicine

## 2018-02-21 ENCOUNTER — Other Ambulatory Visit: Payer: Self-pay | Admitting: Family

## 2018-02-21 NOTE — Telephone Encounter (Signed)
Pt saw [name], NP will forward to Mickel Baas.Marland KitchenJohny Hall

## 2018-02-21 NOTE — Telephone Encounter (Signed)
Patient would like this to be uploaded to her mychart. She would like this today if possible.

## 2018-03-06 ENCOUNTER — Other Ambulatory Visit: Payer: Self-pay | Admitting: Internal Medicine

## 2018-03-06 DIAGNOSIS — F419 Anxiety disorder, unspecified: Secondary | ICD-10-CM

## 2018-03-06 NOTE — Telephone Encounter (Signed)
   LOV:02/20/18 NextOV:not scheduled Last Filled/Quantity:01/24/18 60#

## 2018-03-09 ENCOUNTER — Ambulatory Visit (INDEPENDENT_AMBULATORY_CARE_PROVIDER_SITE_OTHER): Payer: 59 | Admitting: Neurology

## 2018-03-09 ENCOUNTER — Encounter: Payer: Self-pay | Admitting: Neurology

## 2018-03-09 VITALS — BP 104/70 | HR 90 | Ht 68.0 in | Wt 303.2 lb

## 2018-03-09 DIAGNOSIS — S39012A Strain of muscle, fascia and tendon of lower back, initial encounter: Secondary | ICD-10-CM

## 2018-03-09 DIAGNOSIS — G620 Drug-induced polyneuropathy: Secondary | ICD-10-CM | POA: Diagnosis not present

## 2018-03-09 DIAGNOSIS — T451X5A Adverse effect of antineoplastic and immunosuppressive drugs, initial encounter: Secondary | ICD-10-CM | POA: Diagnosis not present

## 2018-03-09 MED ORDER — GABAPENTIN 600 MG PO TABS: 1200.0000 mg | ORAL_TABLET | Freq: Three times a day (TID) | ORAL | 3 refills | Status: DC

## 2018-03-09 NOTE — Progress Notes (Signed)
Follow-up Visit   Date: 03/09/18    [name] MRN: [id number] DOB: Aug 08, 1970   Interim History: [name] is a 47 y.o. right-handed Caucasian female with ovarian cancer s/p bilateral SPO and hysterectomy and chemotherapy (11/2014), hypothyroidism, endometriosis, migraines, and anxiety returning to the clinic for follow-up of chemotherapy-induced neuropathy and new complaints of low back pain.  The patient was accompanied to the clinic by self.  History of present illness: She was diagnosed with bilateral ovarian cancer in July 2016 and underwent BSO with hysterectomy and 6-cycles of adjuvant chemotherapy with carboplatin and taxol, completed in November 2016. Around September 2016, she noticed numbness and tingling of the hands a feet which would initially last the week of chemo and usually resolve by the 3rd week, but this gradually progressed where symptoms became constant and did not improve.She was taking gabapentin 300mg  TID, but did not have any benefit so stopped it. She did not have any side effects to the medication and it was not tried at a higher dose.   She also complains of right sided low back pain which is dull and achy, worse with palpation. She also has posterior leg pain which radiates up her leg and thigh. She denies left sided back pain or leg pain, but does endorse paresthesias. She had not done physical therapy.  She did not complete PT.  Flexeril was started with nominal benefit.  Her gabapentin was slowly titrated to 600mg  TID which has helped the burning pain in her feet.   UPDATE 02/24/2016:  She had worsening pain of her legs, which is described as "radiating pulsating pain" of her legs that starts in her feet and moves up her legs. Her gabapentin was increased to 1200mg  TID by her PCP, which has not made a huge difference. She has severe burning of the feet, as if her feet are on fire.  Additionally, she complains of soreness and weakness of the  right arm, upper arm, and shoulder which is worse with pronation.  At her last visit, she had EDX of the arms which showed bilateral CTS, no evidence of radiculopathy.  She has been wearing a splint at night for bilateral CTS which helps.  She is not interested in PT because of her busy schedule, but has been trying to stay more active herself.  UPDATE 03/09/2018:  She was last seen in 2017 and is here for refills on gabapentin as well as complaints of low back pain.  She is taking gabapentin 1200mg  three times daily which eases her neuropathic pain.  She has noticed mild improvement in sensation in the feet.  She continues to have imbalance, fortunately no falls.    She was made aware that this is maximal dose of gabapentin.  She also complains of right sided low back pain which is tender to touch.  Her right leg feels weaker to her.  She has been offered flexeril by her PCP, which she takes sparingly because of cognitive side effects.    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  Medications:  Current Outpatient Medications on File Prior to Visit  Medication Sig Dispense Refill  . albuterol (PROVENTIL HFA;VENTOLIN HFA) 108 (90 Base) MCG/ACT inhaler TAKE 2 PUFFS BY MOUTH EVERY 6 HOURS AS NEEDED FOR WHEEZE OR SHORTNESS OF BREATH 8.5 Inhaler 0  . ALPRAZolam (XANAX) 1 MG tablet TAKE 1 TABLET (1 MG TOTAL) BY MOUTH 2 (TWO) TIMES DAILY AS NEEDED. FOR ANXIETY 60 tablet 0  .  amoxicillin-clavulanate (AUGMENTIN) 875-125 MG tablet Take 1 tablet by mouth 2 (two) times daily. 20 tablet 0  . amphetamine-dextroamphetamine (ADDERALL) 5 MG tablet Take 1 tablet (5 mg total) by mouth 2 (two) times daily with a meal. 60 tablet 0  . clobetasol cream (TEMOVATE) 2.29 % Apply 1 application topically 2 (two) times daily. 30 g 1  . estradiol (VIVELLE-DOT) 0.05 MG/24HR patch Place 1 patch (0.05 mg total) onto the skin 2 (two) times a week. 8 patch 12  . fluticasone (FLONASE) 50 MCG/ACT nasal spray Place 2 sprays  into both nostrils daily. 16 g 6  . levothyroxine (SYNTHROID, LEVOTHROID) 75 MCG tablet Take 1 tablet (75 mcg total) by mouth daily. 90 tablet 3  . lisinopril (PRINIVIL,ZESTRIL) 5 MG tablet TAKE 1 TABLET BY MOUTH ONCE DAILY 90 tablet 0  . meloxicam (MOBIC) 15 MG tablet TAKE 1 TABLET BY MOUTH ONCE DAILY AS NEEDED FOR PAIN 30 tablet 2  . montelukast (SINGULAIR) 10 MG tablet Take 1 tablet (10 mg total) by mouth at bedtime. 90 tablet 3  . pantoprazole (PROTONIX) 40 MG tablet TAKE 1 TABLET BY MOUTH TWICE A DAY FOR 2 WEEKS, THEN TAKE 1 TABLET ONCE DAILY 30 tablet 5  . sertraline (ZOLOFT) 100 MG tablet TAKE 1 TABLET BY MOUTH EVERY DAY 90 tablet 1  . sodium chloride (OCEAN) 0.65 % SOLN nasal spray Place 1 spray into both nostrils daily as needed for congestion.    Marland Kitchen zolpidem (AMBIEN) 10 MG tablet Take 1 tablet (10 mg total) by mouth at bedtime as needed for sleep. 90 tablet 1   No current facility-administered medications on file prior to visit.     Allergies:  Allergies  Allergen Reactions  . Moxifloxacin Anaphylaxis    needs epinephrine shot  . Quinolones Anaphylaxis, Shortness Of Breath and Swelling  . Doxycycline Nausea And Vomiting  . Escitalopram Oxalate Other (See Comments)     fatigue  . Sulfonamide Derivatives Swelling and Rash    needs epinephrine shot--rash and lip swelling    Review of Systems:  CONSTITUTIONAL: No fevers, chills, night sweats, or weight loss.  EYES: No visual changes or eye pain ENT: No hearing changes.  No history of nose bleeds.   RESPIRATORY: No cough, wheezing and shortness of breath.   CARDIOVASCULAR: Negative for chest pain, and palpitations.   GI: Negative for abdominal discomfort, blood in stools or black stools.  No recent change in bowel habits.   GU:  No history of incontinence.   MUSCLOSKELETAL: +history of joint pain or swelling.  +myalgia.   SKIN: Negative for lesions, rash, and itching.   ENDOCRINE: Negative for cold or heat intolerance,  polydipsia or goiter.   PSYCH:  No depression +anxiety symptoms.   NEURO: As Above.   Vital Signs:  BP 104/70   Pulse 90   Ht 5\' 8"  (1.727 m)   Wt (!) 303 lb 4 oz (137.6 kg)   LMP 11/02/2014   SpO2 98%   BMI 46.11 kg/m   General Medical Exam:   General:  Well appearing, comfortable, morbidly obese Eyes/ENT: see cranial nerve examination.   Neck:  No carotid bruits. Respiratory:  Clear to auscultation, good air entry bilaterally.   Cardiac:  Regular rate and rhythm, no murmur.   Ext:  No edema   Neurological Exam: MENTAL STATUS including orientation to time, place, person, recent and remote memory, attention span and concentration, language, and fund of knowledge is normal.  Speech is not dysarthric.  CRANIAL NERVES:  Extraocular muscular muscles intact.  No ptosis.  Face is symmetric.   MOTOR:  Motor strength shows give-way weakness in the right leg, with effort it's 5/5 throughout.  Localized point tenderness over the left flank.  MSRs:  Right                                                                 Left brachioradialis 2+  brachioradialis 2+  biceps 2+  biceps 2+  triceps 1+  triceps 1+  Patellar 0  Patellar 0  ankle jerk 0  ankle jerk 0  plantar response down  plantar response down   SENSORY:  Sensory loss involving glove-stocking distribution bilaterally.  COORDINATION/GAIT:   Gait wide-based and antalgic, stable.   Data: Lab Results  Component Value Date   TSH 5.12 (H) 10/16/2017   NCS/EMG of the upper extremities 11/24/2015:  Bilateral median neuropathy at or distal to the wrist, consistent with clinical diagnosis of carpal tunnel syndrome. Overall, these findings are moderate in degree electrically   IMPRESSION/PLAN: 1.  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  Continue gabapentin 1200 mg 3 times daily, which is the maximal dose of the medication.  She was informed of this and also instructed that the dose would  need to be lowered if she was ever started on opioid medication.    2.  Lumbar strain.  There is no evidence of lumbar canal or foraminal stenosis based on her history and exam.  She has localized tenderness, which is most consistent with musculoskeletal pain.  Continue Flexeril and start physical therapy for low back pain.  3. Bilateral ovarian cancer s/p SPO and hysterectomy (11/2014) and chemotherapy   Thank you for allowing me to participate in patient's care.  If I can answer any additional questions, I would be pleased to do so.    Sincerely,    Donika K. Posey Pronto, DO

## 2018-03-09 NOTE — Patient Instructions (Addendum)
Start physical therapy for low back pain  Continue gabapentin 1200mg  three times daily.  This is the maximal dose of the medication.

## 2018-03-19 ENCOUNTER — Telehealth: Payer: Self-pay | Admitting: *Deleted

## 2018-03-19 NOTE — Telephone Encounter (Signed)
[facility] has tried to contact the patient on 03-12-18, 03-13-18 and 03-15-18 but patient has not returned their calls.  Email and text sent as well.

## 2018-04-12 ENCOUNTER — Ambulatory Visit: Payer: 59 | Admitting: Podiatry

## 2018-04-13 ENCOUNTER — Inpatient Hospital Stay: Payer: 59

## 2018-04-13 ENCOUNTER — Inpatient Hospital Stay: Payer: 59 | Admitting: Gynecologic Oncology

## 2018-04-13 ENCOUNTER — Telehealth: Payer: Self-pay | Admitting: *Deleted

## 2018-04-13 NOTE — Telephone Encounter (Signed)
Per patient's voicemail message, canceled appts for today. Called and left her a message to call the office back to reschedule

## 2018-04-17 ENCOUNTER — Other Ambulatory Visit: Payer: Self-pay | Admitting: Internal Medicine

## 2018-04-17 DIAGNOSIS — F419 Anxiety disorder, unspecified: Secondary | ICD-10-CM

## 2018-04-17 MED ORDER — ALPRAZOLAM 1 MG PO TABS: 1.0000 mg | ORAL_TABLET | Freq: Two times a day (BID) | ORAL | 2 refills | Status: DC | PRN

## 2018-07-23 ENCOUNTER — Other Ambulatory Visit: Payer: Self-pay

## 2018-07-23 ENCOUNTER — Encounter (HOSPITAL_COMMUNITY): Payer: Self-pay

## 2018-07-23 ENCOUNTER — Emergency Department (HOSPITAL_COMMUNITY): Payer: Self-pay

## 2018-07-23 ENCOUNTER — Inpatient Hospital Stay (HOSPITAL_COMMUNITY)
Admission: EM | Admit: 2018-07-23 | Discharge: 2018-07-30 | DRG: 417 | Disposition: A | Discharge status: Home / Self Care | Payer: Self-pay | Attending: General Surgery | Admitting: General Surgery

## 2018-07-23 DIAGNOSIS — K219 Gastro-esophageal reflux disease without esophagitis: Secondary | ICD-10-CM | POA: Diagnosis present

## 2018-07-23 DIAGNOSIS — Z9221 Personal history of antineoplastic chemotherapy: Secondary | ICD-10-CM

## 2018-07-23 DIAGNOSIS — Z833 Family history of diabetes mellitus: Secondary | ICD-10-CM

## 2018-07-23 DIAGNOSIS — G4733 Obstructive sleep apnea (adult) (pediatric): Secondary | ICD-10-CM | POA: Diagnosis present

## 2018-07-23 DIAGNOSIS — Z881 Allergy status to other antibiotic agents status: Secondary | ICD-10-CM

## 2018-07-23 DIAGNOSIS — Z780 Asymptomatic menopausal state: Secondary | ICD-10-CM

## 2018-07-23 DIAGNOSIS — Z90710 Acquired absence of both cervix and uterus: Secondary | ICD-10-CM

## 2018-07-23 DIAGNOSIS — Z8249 Family history of ischemic heart disease and other diseases of the circulatory system: Secondary | ICD-10-CM

## 2018-07-23 DIAGNOSIS — R932 Abnormal findings on diagnostic imaging of liver and biliary tract: Secondary | ICD-10-CM

## 2018-07-23 DIAGNOSIS — Z888 Allergy status to other drugs, medicaments and biological substances status: Secondary | ICD-10-CM

## 2018-07-23 DIAGNOSIS — K8050 Calculus of bile duct without cholangitis or cholecystitis without obstruction: Secondary | ICD-10-CM

## 2018-07-23 DIAGNOSIS — G5790 Unspecified mononeuropathy of unspecified lower limb: Secondary | ICD-10-CM | POA: Diagnosis present

## 2018-07-23 DIAGNOSIS — J45990 Exercise induced bronchospasm: Secondary | ICD-10-CM | POA: Diagnosis present

## 2018-07-23 DIAGNOSIS — Z87898 Personal history of other specified conditions: Secondary | ICD-10-CM

## 2018-07-23 DIAGNOSIS — K8010 Calculus of gallbladder with chronic cholecystitis without obstruction: Secondary | ICD-10-CM | POA: Diagnosis present

## 2018-07-23 DIAGNOSIS — K8020 Calculus of gallbladder without cholecystitis without obstruction: Secondary | ICD-10-CM

## 2018-07-23 DIAGNOSIS — F419 Anxiety disorder, unspecified: Secondary | ICD-10-CM | POA: Diagnosis present

## 2018-07-23 DIAGNOSIS — K5900 Constipation, unspecified: Secondary | ICD-10-CM | POA: Diagnosis present

## 2018-07-23 DIAGNOSIS — Z820 Family history of epilepsy and other diseases of the nervous system: Secondary | ICD-10-CM

## 2018-07-23 DIAGNOSIS — R7989 Other specified abnormal findings of blood chemistry: Secondary | ICD-10-CM | POA: Diagnosis present

## 2018-07-23 DIAGNOSIS — K8043 Calculus of bile duct with acute cholecystitis with obstruction: Secondary | ICD-10-CM

## 2018-07-23 DIAGNOSIS — R1011 Right upper quadrant pain: Secondary | ICD-10-CM

## 2018-07-23 DIAGNOSIS — K858 Other acute pancreatitis without necrosis or infection: Secondary | ICD-10-CM | POA: Diagnosis not present

## 2018-07-23 DIAGNOSIS — Z823 Family history of stroke: Secondary | ICD-10-CM

## 2018-07-23 DIAGNOSIS — I10 Essential (primary) hypertension: Secondary | ICD-10-CM | POA: Diagnosis not present

## 2018-07-23 DIAGNOSIS — E039 Hypothyroidism, unspecified: Secondary | ICD-10-CM | POA: Diagnosis present

## 2018-07-23 DIAGNOSIS — Z419 Encounter for procedure for purposes other than remedying health state, unspecified: Secondary | ICD-10-CM

## 2018-07-23 DIAGNOSIS — Z79890 Hormone replacement therapy (postmenopausal): Secondary | ICD-10-CM

## 2018-07-23 DIAGNOSIS — Y848 Other medical procedures as the cause of abnormal reaction of the patient, or of later complication, without mention of misadventure at the time of the procedure: Secondary | ICD-10-CM | POA: Diagnosis not present

## 2018-07-23 DIAGNOSIS — R1084 Generalized abdominal pain: Secondary | ICD-10-CM | POA: Diagnosis not present

## 2018-07-23 DIAGNOSIS — R030 Elevated blood-pressure reading, without diagnosis of hypertension: Secondary | ICD-10-CM | POA: Diagnosis present

## 2018-07-23 DIAGNOSIS — Z6841 Body Mass Index (BMI) 40.0 and over, adult: Secondary | ICD-10-CM

## 2018-07-23 DIAGNOSIS — R110 Nausea: Secondary | ICD-10-CM | POA: Diagnosis not present

## 2018-07-23 DIAGNOSIS — K8067 Calculus of gallbladder and bile duct with acute and chronic cholecystitis with obstruction: Principal | ICD-10-CM | POA: Diagnosis present

## 2018-07-23 DIAGNOSIS — Z882 Allergy status to sulfonamides status: Secondary | ICD-10-CM

## 2018-07-23 DIAGNOSIS — Z79899 Other long term (current) drug therapy: Secondary | ICD-10-CM

## 2018-07-23 DIAGNOSIS — Z8543 Personal history of malignant neoplasm of ovary: Secondary | ICD-10-CM

## 2018-07-23 DIAGNOSIS — R911 Solitary pulmonary nodule: Secondary | ICD-10-CM | POA: Diagnosis present

## 2018-07-23 LAB — URINALYSIS, ROUTINE W REFLEX MICROSCOPIC
Bilirubin Urine: NEGATIVE
Glucose, UA: NEGATIVE mg/dL
Hgb urine dipstick: NEGATIVE
Ketones, ur: NEGATIVE mg/dL
Leukocytes,Ua: NEGATIVE
Nitrite: NEGATIVE
Protein, ur: NEGATIVE mg/dL
Specific Gravity, Urine: 1.016 (ref 1.005–1.030)
pH: 7 (ref 5.0–8.0)

## 2018-07-23 LAB — CBC
HEMATOCRIT: 43.6 % (ref 36.0–46.0)
Hemoglobin: 14 g/dL (ref 12.0–15.0)
MCH: 29.9 pg (ref 26.0–34.0)
MCHC: 32.1 g/dL (ref 30.0–36.0)
MCV: 93.2 fL (ref 80.0–100.0)
Platelets: 282 10*3/uL (ref 150–400)
RBC: 4.68 MIL/uL (ref 3.87–5.11)
RDW: 12.7 % (ref 11.5–15.5)
WBC: 17.5 10*3/uL — ABNORMAL HIGH (ref 4.0–10.5)
nRBC: 0 % (ref 0.0–0.2)

## 2018-07-23 LAB — COMPREHENSIVE METABOLIC PANEL
ALT: 52 U/L — ABNORMAL HIGH (ref 0–44)
ANION GAP: 8 (ref 5–15)
AST: 116 U/L — ABNORMAL HIGH (ref 15–41)
Albumin: 4.6 g/dL (ref 3.5–5.0)
Alkaline Phosphatase: 68 U/L (ref 38–126)
BUN: 13 mg/dL (ref 6–20)
CHLORIDE: 104 mmol/L (ref 98–111)
CO2: 26 mmol/L (ref 22–32)
Calcium: 9.5 mg/dL (ref 8.9–10.3)
Creatinine, Ser: 0.83 mg/dL (ref 0.44–1.00)
GFR calc Af Amer: >60 mL/min (ref >60)
GFR calc non Af Amer: >60 mL/min (ref >60)
Glucose, Bld: 152 mg/dL — ABNORMAL HIGH (ref 70–99)
Potassium: 4 mmol/L (ref 3.5–5.1)
Sodium: 138 mmol/L (ref 135–145)
Total Bilirubin: 1.6 mg/dL — ABNORMAL HIGH (ref 0.3–1.2)
Total Protein: 8.4 g/dL — ABNORMAL HIGH (ref 6.5–8.1)

## 2018-07-23 LAB — LIPASE, BLOOD: Lipase: 38 U/L (ref 11–51)

## 2018-07-23 MED ORDER — ONDANSETRON 4 MG PO TBDP
4.0000 mg | ORAL_TABLET | Freq: Once | ORAL | Status: AC | PRN
  Administered 2018-07-23: 4 mg via ORAL
  Filled 2018-07-23: qty 1

## 2018-07-23 MED ORDER — HYDROMORPHONE HCL 1 MG/ML IJ SOLN
1.0000 mg | Freq: Once | INTRAMUSCULAR | Status: AC
  Administered 2018-07-24: 1 mg via INTRAVENOUS
  Filled 2018-07-23: qty 1

## 2018-07-23 MED ORDER — ONDANSETRON HCL 4 MG/2ML IJ SOLN
4.0000 mg | Freq: Once | INTRAMUSCULAR | Status: AC
  Administered 2018-07-23: 4 mg via INTRAVENOUS
  Filled 2018-07-23: qty 2

## 2018-07-23 MED ORDER — SODIUM CHLORIDE 0.9 % IV BOLUS
1000.0000 mL | Freq: Once | INTRAVENOUS | Status: AC
  Administered 2018-07-23: 1000 mL via INTRAVENOUS

## 2018-07-23 MED ORDER — MORPHINE SULFATE (PF) 4 MG/ML IV SOLN
4.0000 mg | Freq: Once | INTRAVENOUS | Status: AC
  Administered 2018-07-23: 4 mg via INTRAVENOUS
  Filled 2018-07-23: qty 1

## 2018-07-23 MED ORDER — ONDANSETRON HCL 4 MG/2ML IJ SOLN
4.0000 mg | Freq: Once | INTRAMUSCULAR | Status: AC
  Administered 2018-07-24: 4 mg via INTRAVENOUS
  Filled 2018-07-23: qty 2

## 2018-07-23 MED ORDER — SODIUM CHLORIDE 0.9% FLUSH: 3.0000 mL | Freq: Once | INTRAVENOUS | Status: DC

## 2018-07-23 NOTE — H&P (Signed)
Re:   [name] DOB:   1971-02-15 MRN:   [id number]  Chief Complaint Abdominal pain  ASSESEMENT AND PLAN: 1.  Cholelithiasis, cholecystitis   I discussed with the patient the indications and risks of gall bladder surgery.  The primary risks of gall bladder surgery include, but are not limited to, bleeding, infection, common bile duct injury, and open surgery.  There is also the risk that the patient may have continued symptoms after surgery.  We discussed the typical post-operative recovery course. I tried to answer the patient's questions.  Dr. Harlow Asa is our surgeon of the week.  I discussed with the patient that the timing of surgery would be determined by himm.  2.  History of ovarian cancer -   S/p BSO/TAH and chemotx - 11/2014  Followed by Dr. Denman George  She was followed by Dr. Marko Plume 3.  Hypothyroidism 4.  Anxiety 5.  Chemotherapy induced neuropathy of the LE  Saw Dr. Narda Amber 6.  Morbid obesity -   BMI - 44.8 7.  OSA on CPAP 8.  Lipedema of both LE  Chief Complaint  Patient presents with  . Abdominal Pain   PHYSICIAN REQUESTING CONSULTATION:  [place] Robinson, PA, [facility]  HISTORY OF PRESENT ILLNESS: [name] is a 48 y.o. (DOB: 1970-10-31)  white female whose primary care physician is Biagio Borg, MD.  She is with her fiancee, Zella Ball.   The patient had some vague abdominal pain about 1 month ago.  She vomited and felt better.  She did not seek medical attention at that time.  Today she was walking with her fianc when she developed severe epigastric and right upper quadrant pain.  Because of the severity of the pain, she was brought to the [facility] emergency room by ambulance.  She has a prior history of a total abdominal hysterectomy and bilateral salpingo-oophorectomy in July 2016 for bilateral clear-cell ovarian carcinoma.  The surgery was done in [facility].  She underwent chemotherapy supervised by Dr. Marko Plume.  She is now followed by Dr. Denman George.  She  denies any other GI problems.  She has had no history of stomach, liver, pancreas disease.  Her [place] - 07/23/2018 -s hows cholelithiasis CT of abdomen - 10/11/2017 - left renal calculus, no recurrent carcinoman   Past Medical History:  Diagnosis Date  . Anxiety   . Elevated blood pressure, situational 05/05/2016  . Endometriosis   . Exercise-induced asthma 09/08/2015  . Ovarian cancer, bilateral ([place]) 11/28/2014  . PONV (postoperative nausea and vomiting)   . Thyroid disease       Past Surgical History:  Procedure Laterality Date  . ABDOMINAL HYSTERECTOMY  11/10/14   at [facility], Exp lap, supracervical hyst, BSO, infracolic omentectomy, lymphadenectomy, aortic lymph node sampling  . IR GENERIC HISTORICAL  05/12/2016   IR REMOVAL TUN ACCESS W/ PORT W/O FL MOD SED 05/12/2016 [facility]      Current Facility-Administered Medications  Medication Dose Route Frequency Provider Last Rate Last Dose  . sodium chloride flush (NS) 0.9 % injection 3 mL  3 mL Intravenous Once Lennice Sites, DO       Current Outpatient Medications  Medication Sig Dispense Refill  . acetaminophen (TYLENOL) 650 MG CR tablet Take 1,300 mg by mouth daily as needed for pain.    Marland Kitchen ALPRAZolam (XANAX) 1 MG tablet Take 1 tablet (1 mg total) by mouth 2 (two) times daily as needed. for anxiety (Patient taking differently: Take 1 mg by mouth daily  as needed for anxiety. for anxiety) 60 tablet 2  . estradiol (VIVELLE-DOT) 0.05 MG/24HR patch Place 1 patch (0.05 mg total) onto the skin 2 (two) times a week. 8 patch 12  . fluticasone (FLONASE) 50 MCG/ACT nasal spray Place 2 sprays into both nostrils daily. 16 g 6  . gabapentin (NEURONTIN) 600 MG tablet Take 2 tablets (1,200 mg total) by mouth 3 (three) times daily. (Patient taking differently: Take 1,200 mg by mouth at bedtime. ) 540 tablet 3  . hydroxypropyl methylcellulose / hypromellose (ISOPTO TEARS / GONIOVISC) 2.5 % ophthalmic solution Place 1 drop into both eyes daily as needed for  dry eyes.    Marland Kitchen levothyroxine (SYNTHROID, LEVOTHROID) 75 MCG tablet Take 1 tablet (75 mcg total) by mouth daily. 90 tablet 3  . Melatonin 10 MG TABS Take 10 mg by mouth at bedtime.    . sertraline (ZOLOFT) 100 MG tablet TAKE 1 TABLET BY MOUTH EVERY DAY 90 tablet 1  . sodium chloride (OCEAN) 0.65 % SOLN nasal spray Place 1 spray into both nostrils daily as needed for congestion.    Marland Kitchen VALERIAN PO Take 1 tablet by mouth daily.    Marland Kitchen zolpidem (AMBIEN) 10 MG tablet Take 1 tablet (10 mg total) by mouth at bedtime as needed for sleep. 90 tablet 1  . albuterol (PROVENTIL HFA;VENTOLIN HFA) 108 (90 Base) MCG/ACT inhaler TAKE 2 PUFFS BY MOUTH EVERY 6 HOURS AS NEEDED FOR WHEEZE OR SHORTNESS OF BREATH (Patient not taking: Reported on 07/23/2018) 8.5 Inhaler 0  . amoxicillin-clavulanate (AUGMENTIN) 875-125 MG tablet Take 1 tablet by mouth 2 (two) times daily. (Patient not taking: Reported on 07/23/2018) 20 tablet 0  . amphetamine-dextroamphetamine (ADDERALL) 5 MG tablet Take 1 tablet (5 mg total) by mouth 2 (two) times daily with a meal. (Patient not taking: Reported on 07/23/2018) 60 tablet 0  . clobetasol cream (TEMOVATE) 5.73 % Apply 1 application topically 2 (two) times daily. (Patient not taking: Reported on 07/23/2018) 30 g 1  . lisinopril (PRINIVIL,ZESTRIL) 5 MG tablet TAKE 1 TABLET BY MOUTH ONCE DAILY (Patient not taking: Reported on 07/23/2018) 90 tablet 0  . meloxicam (MOBIC) 15 MG tablet TAKE 1 TABLET BY MOUTH ONCE DAILY AS NEEDED FOR PAIN (Patient not taking: Reported on 07/23/2018) 30 tablet 2  . montelukast (SINGULAIR) 10 MG tablet Take 1 tablet (10 mg total) by mouth at bedtime. (Patient not taking: Reported on 07/23/2018) 90 tablet 3  . pantoprazole (PROTONIX) 40 MG tablet TAKE 1 TABLET BY MOUTH TWICE A DAY FOR 2 WEEKS, THEN TAKE 1 TABLET ONCE DAILY (Patient not taking: Reported on 07/23/2018) 30 tablet 5      Allergies  Allergen Reactions  . Moxifloxacin Anaphylaxis    needs epinephrine shot  .  Quinolones Anaphylaxis, Shortness Of Breath and Swelling  . Doxycycline Nausea And Vomiting  . Escitalopram Oxalate Other (See Comments)     fatigue  . Sulfonamide Derivatives Swelling and Rash    needs epinephrine shot--rash and lip swelling    REVIEW OF SYSTEMS: Skin:  No history of rash.  No history of abnormal moles. Infection:  No history of hepatitis or HIV.  No history of MRSA. Neurologic:  Chemotherapy induced neuropathy.   Saw Dr. Narda Amber  Remote history of migraine HA - but none in the last year Cardiac:  No history of hypertension. No history of heart disease.  No history of prior cardiac catheterization.  No history of seeing a cardiologist. Pulmonary:  OSA on CPAP x 1 year  Endocrine:  No diabetes. Thyroid replacement x 3 years. Gastrointestinal:  See HPI GYN:  History of ovarian cancer -  S/p BSO/TAH and chemotx - 11/2014 at [facility]  Followed by Dr. Denman George.  She was followed by Dr. Marko Plume Urologic:  No history of kidney stones.  No history of bladder infections. Musculoskeletal:  Right knee meniscal tear - 2013, Cyst left ankle - 2018 Hematologic:  No bleeding disorder.  No history of anemia.  Not anticoagulated. Psycho-social:  The patient is oriented.   The patient has no obvious psychologic or social impairment to understanding our conversation and plan.  SOCIAL and FAMILY HISTORY: She is with her fiancee, Zella Ball. She has no children. 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  PHYSICAL EXAM: BP (!) 150/68 (BP Location: Left Arm)   Pulse 86   Temp 98.2 F (36.8 C) (Oral)   Resp 18   Ht 5\' 8"  (1.727 m)   Wt 133.8 kg   LMP 11/02/2014   SpO2 98%   BMI 44.85 kg/m   General: Obese WF who is alert.  She looks uncomfortable. Skin:  Inspection and palpation - no mass or rash. Eyes:  Conjunctiva and lids unremarkable.            Pupils are equal Ears, Nose, Mouth, and Throat:  Ears and nose unremarkable            Lips and teeth are  unremarable. Neck: Supple. No mass, trachea midline.  No thyroid mass. Lymph Nodes:  No supraclavicular, cervical, or inguinal nodes. Lungs: Normal respiratory effort.  Clear to auscultation and symmetric breath sounds. Heart:  Palpation of the heart is normal.            Auscultation: RRR. No murmur or rub.  Abdomen: Soft. No mass. Tenderness in the RUQ. No hernia.             Has bowel sounds.  Lower midline abdominal scar. Rectal: Not done. Musculoskeletal:  Good muscle strength and ROM  in upper and lower extremities.  She has lipedema of both LE.  Neurologic:  Grossly intact to motor and sensory function. Psychiatric: Normal judgement and insight. Behavior is normal.            Oriented to time, person, place.   DATA REVIEWED, COUNSELING AND COORDINATION OF CARE: Epic notes reviewed. Counseling and coordination of care exceeded more than 50% of the time spent with patient. Total time spent with patient and charting: 50 minutes  Alphonsa Overall, MD,  [facility] Surgery, [address]  [place], [place]    [place] Phone:  [phone] FAX:  [phone]

## 2018-07-23 NOTE — ED Triage Notes (Signed)
Per EMS- Patient c/o RLQ abdominal pain that started  Today. Patient reports a history of ovarian cyst.  Patient was given Fentanyl 100 mcg intranasally prior to arrival to the ED.

## 2018-07-23 NOTE — ED Triage Notes (Addendum)
Pt c/o sharp RLQ pain that began suddenly today and radiates across her abdomen. Pt also reports some mild tenderness in the right lower back.

## 2018-07-23 NOTE — ED Provider Notes (Signed)
[facility] DEPT Provider Note   CSN: [id number] Arrival date & time: 07/23/18  1741    History   Chief Complaint Chief Complaint  Patient presents with  . Abdominal Pain    HPI [name] is a 48 y.o. female w PMHx ovarian cancer in remission s/p total abdominal hysterectomy, hypothyroidism, anxiety, presenting to the ED with complaint of sudden onset of RUQ abdominal pain that began this afternoon while on a walk. Pain is severe, sharp, feels like gas.  Deep breaths cause pain in her belly.  She has associated nausea. She states she had similar symptoms about 1 month ago, however resolved on its own.  She denies associated diarrhea, constipation, fevers, urinary symptoms.  Past abdominal surgeries include total abdominal hysterectomy. Last meal around 11am today.     The history is provided by the patient.    Past Medical History:  Diagnosis Date  . Anxiety   . Elevated blood pressure, situational 05/05/2016  . Endometriosis   . Exercise-induced asthma 09/08/2015  . Ovarian cancer, bilateral ([place]) 11/28/2014  . PONV (postoperative nausea and vomiting)   . Thyroid disease     Patient Active Problem List   Diagnosis Date Noted  . Hyperglycemia 10/16/2017  . Hot flash not due to menopause 10/16/2017  . Eczema 10/16/2017  . Flu-like symptoms 06/20/2017  . Cognitive changes 01/30/2017  . Disturbed concentration 01/26/2017  . Left shoulder pain 11/01/2016  . Right low back pain 11/01/2016  . Elevated blood pressure, situational 05/05/2016  . Surgical menopause 01/29/2016  . OSA on CPAP 12/22/2015  . Cough 11/26/2015  . Capsulitis 10/06/2015  . Ganglion cyst of left foot 10/06/2015  . Fatigue 09/13/2015  . Exercise-induced asthma 09/08/2015  . Exertional dyspnea 09/08/2015  . Peroneal ganglion cyst 07/20/2015  . Low back pain 07/20/2015  . Peroneal tendinitis of left lower leg 06/25/2015  . Nonallopathic lesion of cervical region  06/25/2015  . Wheezing 05/08/2015  . Polyarthralgia 05/08/2015  . Peripheral edema 03/16/2015  . Central line complication 68/34/1962  . Morbid obesity with BMI of 45.0-49.9, adult ([place]) 02/28/2015  . Chemotherapy induced neutropenia ([place]) 02/28/2015  . Genetic testing 01/05/2015  . Chemotherapy induced nausea and vomiting 01/02/2015  . Anxiety 01/02/2015  . Chemotherapy-induced peripheral neuropathy ([place]) 01/02/2015  . Premature surgical menopause 12/17/2014  . Leukopenia due to antineoplastic chemotherapy ([place]) 12/17/2014  . Port catheter in place 12/17/2014  . Encounter for antineoplastic chemotherapy 12/17/2014  . Myalgia 12/09/2014  . Hypersensitivity reaction 12/05/2014  . Ovarian cancer, bilateral ([place]) 11/28/2014  . Poor venous access 11/28/2014  . Postoperative cellulitis of surgical wound 11/24/2014  . Encounter for well adult exam with abnormal findings 09/10/2014  . Hypothyroidism 09/10/2014  . Hypersomnolence 09/10/2014  . Acute non-recurrent frontal sinusitis 06/24/2014  . Right knee pain 06/24/2014  . Lower back pain 01/08/2014  . EAR PAIN, BILATERAL 12/18/2009  . KNEE PAIN, RIGHT 12/18/2009  . INGROWN TOENAIL 12/17/2008  . BACK PAIN 12/17/2008  . Acute bronchospasm 11/11/2008  . Depression 11/29/2007  . Insomnia 11/29/2007  . Allergic rhinitis 04/13/2007  . GERD 04/13/2007  . ENDOMETRIOSIS 04/13/2007  . Migraine headache 01/29/2007    Past Surgical History:  Procedure Laterality Date  . ABDOMINAL HYSTERECTOMY  11/10/14   at [facility], Exp lap, supracervical hyst, BSO, infracolic omentectomy, lymphadenectomy, aortic lymph node sampling  . IR GENERIC HISTORICAL  05/12/2016   IR REMOVAL TUN ACCESS W/ PORT W/O FL MOD SED 05/12/2016 [facility]  OB History   No obstetric history on file.      Home Medications    Prior to Admission medications   Medication Sig Start Date End Date Taking? Authorizing Provider  acetaminophen (TYLENOL) 650 MG CR tablet Take  1,300 mg by mouth daily as needed for pain.   Yes [provider]  ALPRAZolam Duanne Moron) 1 MG tablet Take 1 tablet (1 mg total) by mouth 2 (two) times daily as needed. for anxiety Patient taking differently: Take 1 mg by mouth daily as needed for anxiety. for anxiety 04/17/18  Yes Biagio Borg, MD  estradiol (VIVELLE-DOT) 0.05 MG/24HR patch Place 1 patch (0.05 mg total) onto the skin 2 (two) times a week. 10/09/17  Yes Everitt Amber, MD  fluticasone [facility]) 50 MCG/ACT nasal spray Place 2 sprays into both nostrils daily. 07/05/17  Yes Marrian Salvage, FNP  gabapentin (NEURONTIN) 600 MG tablet Take 2 tablets (1,200 mg total) by mouth 3 (three) times daily. Patient taking differently: Take 1,200 mg by mouth at bedtime.  03/09/18  Yes Patel, Donika K, DO  hydroxypropyl methylcellulose / hypromellose (ISOPTO TEARS / GONIOVISC) 2.5 % ophthalmic solution Place 1 drop into both eyes daily as needed for dry eyes.   Yes [provider]  levothyroxine (SYNTHROID, LEVOTHROID) 75 MCG tablet Take 1 tablet (75 mcg total) by mouth daily. 10/18/17  Yes Biagio Borg, MD  Melatonin 10 MG TABS Take 10 mg by mouth at bedtime.   Yes [provider]  sertraline (ZOLOFT) 100 MG tablet TAKE 1 TABLET BY MOUTH EVERY DAY 02/12/18  Yes Biagio Borg, MD  sodium chloride (OCEAN) 0.65 % SOLN nasal spray Place 1 spray into both nostrils daily as needed for congestion.   Yes [provider]  VALERIAN PO Take 1 tablet by mouth daily.   Yes [provider]  zolpidem (AMBIEN) 10 MG tablet Take 1 tablet (10 mg total) by mouth at bedtime as needed for sleep. 10/16/17 07/23/18 Yes Biagio Borg, MD  albuterol (PROVENTIL HFA;VENTOLIN HFA) 108 (90 Base) MCG/ACT inhaler TAKE 2 PUFFS BY MOUTH EVERY 6 HOURS AS NEEDED FOR WHEEZE OR SHORTNESS OF BREATH Patient not taking: Reported on 07/23/2018 08/28/17   Biagio Borg, MD  amoxicillin-clavulanate (AUGMENTIN) 875-125 MG tablet Take 1 tablet by mouth 2  (two) times daily. Patient not taking: Reported on 07/23/2018 02/20/18   Marrian Salvage, FNP  amphetamine-dextroamphetamine (ADDERALL) 5 MG tablet Take 1 tablet (5 mg total) by mouth 2 (two) times daily with a meal. Patient not taking: Reported on 07/23/2018 01/23/18   Joylene John D, NP  clobetasol cream (TEMOVATE) 6.54 % Apply 1 application topically 2 (two) times daily. Patient not taking: Reported on 07/23/2018 10/16/17   Biagio Borg, MD  lisinopril (PRINIVIL,ZESTRIL) 5 MG tablet TAKE 1 TABLET BY MOUTH ONCE DAILY Patient not taking: Reported on 07/23/2018 05/18/17   Biagio Borg, MD  meloxicam ([place]) 15 MG tablet TAKE 1 TABLET BY MOUTH ONCE DAILY AS NEEDED FOR PAIN Patient not taking: Reported on 07/23/2018 12/11/17   Biagio Borg, MD  montelukast (SINGULAIR) 10 MG tablet Take 1 tablet (10 mg total) by mouth at bedtime. Patient not taking: Reported on 07/23/2018 11/01/16   Biagio Borg, MD  pantoprazole (PROTONIX) 40 MG tablet TAKE 1 TABLET BY MOUTH TWICE A DAY FOR 2 WEEKS, THEN TAKE 1 TABLET ONCE DAILY Patient not taking: Reported on 07/23/2018 07/17/17   Biagio Borg, MD    Family History  Family History  Problem Relation Age of Onset  . Hypertension Mother   . Diabetes Mother   . Stroke Mother        Deceased  . Atrial fibrillation Father        Living  . Diabetes Maternal Aunt   . Heart failure Maternal Grandmother   . Heart attack Maternal Grandfather   . Dementia Paternal Grandmother   . Dementia Paternal Grandfather   . Healthy Brother     Social History Social History   Tobacco Use  . Smoking status: Never Smoker  . Smokeless tobacco: Never Used  Substance Use Topics  . Alcohol use: Yes    Alcohol/week: 0.0 standard drinks    Comment: Rarely  . Drug use: No     Allergies   Moxifloxacin; Quinolones; Doxycycline; Escitalopram oxalate; and Sulfonamide derivatives   Review of Systems Review of Systems  Constitutional: Negative for fever.  Respiratory:  Negative for cough and shortness of breath.   Gastrointestinal: Positive for abdominal pain and nausea. Negative for constipation, diarrhea and vomiting.  Genitourinary: Negative for dysuria and frequency.  All other systems reviewed and are negative.    Physical Exam Updated Vital Signs BP (!) 150/68 (BP Location: Left Arm)   Pulse 86   Temp 98.2 F (36.8 C) (Oral)   Resp 18   Ht 5\' 8"  (1.727 m)   Wt 133.8 kg   LMP 11/02/2014   SpO2 98%   BMI 44.85 kg/m   Physical Exam Vitals signs and nursing note reviewed.  Constitutional:      Appearance: She is well-developed. She is obese.  HENT:     Head: Normocephalic and atraumatic.     Mouth/Throat:     Mouth: Mucous membranes are moist.  Eyes:     Conjunctiva/sclera: Conjunctivae normal.  Cardiovascular:     Rate and Rhythm: Normal rate and regular rhythm.  Pulmonary:     Effort: Pulmonary effort is normal. No respiratory distress.     Breath sounds: Normal breath sounds.  Abdominal:     General: A surgical scar is present. Bowel sounds are normal. There is no distension.     Palpations: Abdomen is soft.     Tenderness: There is abdominal tenderness in the right upper quadrant and epigastric area. There is no guarding or rebound. Negative signs include Murphy's sign.  Skin:    General: Skin is warm.  Neurological:     Mental Status: She is alert.  Psychiatric:        Behavior: Behavior normal.      ED Treatments / Results  Labs (all labs ordered are listed, but only abnormal results are displayed) Labs Reviewed  COMPREHENSIVE METABOLIC PANEL - Abnormal; Notable for the following components:      Result Value   Glucose, Bld 152 (*)    Total Protein 8.4 (*)    AST 116 (*)    ALT 52 (*)    Total Bilirubin 1.6 (*)    All other components within normal limits  CBC - Abnormal; Notable for the following components:   WBC 17.5 (*)    All other components within normal limits  URINALYSIS, ROUTINE W REFLEX MICROSCOPIC  - Abnormal; Notable for the following components:   APPearance CLOUDY (*)    All other components within normal limits  LIPASE, BLOOD    EKG None  Radiology US Abdomen Limited Ruq  Result Date: 07/23/2018 CLINICAL DATA:  Right upper quadrant pain EXAM: ULTRASOUND ABDOMEN LIMITED RIGHT UPPER  QUADRANT COMPARISON:  10/11/2017 FINDINGS: Gallbladder: Numerous layering gallstones within the gallbladder. No wall thickening or sonographic Murphy's sign. Small anterior gallbladder wall polyp. Common bile duct: Diameter: Normal caliber, 5 mm Liver: No focal lesion identified. Within normal limits in parenchymal echogenicity. Portal vein is patent on color Doppler imaging with normal direction of blood flow towards the liver. IMPRESSION: Cholelithiasis.  No sonographic evidence of acute cholecystitis. No acute findings. Electronically Signed   By: Rolm Baptise M.D.   On: 07/23/2018 22:10    Procedures Procedures (including critical care time)  Medications Ordered in ED Medications  sodium chloride flush (NS) 0.9 % injection 3 mL (has no administration in time range)  HYDROmorphone (DILAUDID) injection 1 mg (has no administration in time range)  ondansetron (ZOFRAN) injection 4 mg (has no administration in time range)  ondansetron (ZOFRAN-ODT) disintegrating tablet 4 mg (4 mg Oral Given 07/23/18 1808)  sodium chloride 0.9 % bolus 1,000 mL (1,000 mLs Intravenous New Bag/Given 07/23/18 2042)  morphine 4 MG/ML injection 4 mg (4 mg Intravenous Given 07/23/18 2044)  ondansetron (ZOFRAN) injection 4 mg (4 mg Intravenous Given 07/23/18 2044)  morphine 4 MG/ML injection 4 mg (4 mg Intravenous Given 07/23/18 2149)     Initial Impression / Assessment and Plan / ED Course  I have reviewed the triage vital signs and the nursing notes.  Pertinent labs & imaging results that were available during my care of the patient were reviewed by me and considered in my medical decision making (see chart for details).   Clinical Course as of Jul 22 2341  Mon Jul 23, 2018  2258 Patient discussed with Dr. Lucia Gaskins with general surgery.  He will evaluate patient in the ED after his case with anticipated admission.   [JR]    Clinical Course User Index [JR] Mckinnley Cottier, [place] N, PA-C       Patient presented with sudden onset of right upper quadrant abdominal pain that began today with associated nausea and vomiting.  1 similar episode last month.  On exam, patient is very uncomfortable.  Afebrile.  Abdomen is tender in the right upper quadrant, no guarding or rebound.  Labs with leukocytosis and mildly elevated LFTs.  Right upper quadrant ultrasound consistent with cholelithiasis without evidence of cholecystitis.  Patient had multiple days of pain medication without significant improvement.  Had shared decision making.  Consult placed to general surgery, Dr. Lucia Gaskins, who will evaluate patient in the ED, with likely disposition of admission.  Oncoming provider, PA Layden, to assume care and disposition. Final Clinical Impressions(s) / ED Diagnoses   Final diagnoses:  RUQ abdominal pain  Symptomatic cholelithiasis    ED Discharge Orders    None       Shakera Ebrahimi, [place] N, PA-C 07/23/18 2344    Lennice Sites, DO 07/24/18 0023

## 2018-07-24 ENCOUNTER — Observation Stay (HOSPITAL_COMMUNITY): Payer: Self-pay | Admitting: Anesthesiology

## 2018-07-24 ENCOUNTER — Encounter (HOSPITAL_COMMUNITY): Payer: Self-pay | Admitting: General Practice

## 2018-07-24 ENCOUNTER — Observation Stay (HOSPITAL_COMMUNITY): Payer: Self-pay

## 2018-07-24 ENCOUNTER — Encounter (HOSPITAL_COMMUNITY): Admission: EM | Disposition: A | Discharge status: Home / Self Care | Payer: Self-pay | Source: Home / Self Care

## 2018-07-24 DIAGNOSIS — K8010 Calculus of gallbladder with chronic cholecystitis without obstruction: Secondary | ICD-10-CM | POA: Diagnosis present

## 2018-07-24 HISTORY — PX: CHOLECYSTECTOMY: SHX55

## 2018-07-24 LAB — CBC WITH DIFFERENTIAL/PLATELET
Abs Immature Granulocytes: 0.05 10*3/uL (ref 0.00–0.07)
Basophils Absolute: 0 10*3/uL (ref 0.0–0.1)
Basophils Relative: 0 %
EOS ABS: 0 10*3/uL (ref 0.0–0.5)
Eosinophils Relative: 0 %
HEMATOCRIT: 40.8 % (ref 36.0–46.0)
Hemoglobin: 13 g/dL (ref 12.0–15.0)
Immature Granulocytes: 1 %
Lymphocytes Relative: 13 %
Lymphs Abs: 1.2 10*3/uL (ref 0.7–4.0)
MCH: 30 pg (ref 26.0–34.0)
MCHC: 31.9 g/dL (ref 30.0–36.0)
MCV: 94 fL (ref 80.0–100.0)
Monocytes Absolute: 0.4 10*3/uL (ref 0.1–1.0)
Monocytes Relative: 5 %
NRBC: 0 % (ref 0.0–0.2)
Neutro Abs: 7.8 10*3/uL — ABNORMAL HIGH (ref 1.7–7.7)
Neutrophils Relative %: 81 %
Platelets: 234 10*3/uL (ref 150–400)
RBC: 4.34 MIL/uL (ref 3.87–5.11)
RDW: 12.7 % (ref 11.5–15.5)
WBC: 9.5 10*3/uL (ref 4.0–10.5)

## 2018-07-24 LAB — COMPREHENSIVE METABOLIC PANEL
ALK PHOS: 74 U/L (ref 38–126)
ALT: 191 U/L — ABNORMAL HIGH (ref 0–44)
AST: 294 U/L — ABNORMAL HIGH (ref 15–41)
Albumin: 3.9 g/dL (ref 3.5–5.0)
Anion gap: 7 (ref 5–15)
BILIRUBIN TOTAL: 2.2 mg/dL — AB (ref 0.3–1.2)
BUN: 12 mg/dL (ref 6–20)
CO2: 25 mmol/L (ref 22–32)
Calcium: 9.1 mg/dL (ref 8.9–10.3)
Chloride: 107 mmol/L (ref 98–111)
Creatinine, Ser: 0.84 mg/dL (ref 0.44–1.00)
GFR calc Af Amer: >60 mL/min (ref >60)
GFR calc non Af Amer: >60 mL/min (ref >60)
Glucose, Bld: 133 mg/dL — ABNORMAL HIGH (ref 70–99)
Potassium: 4.1 mmol/L (ref 3.5–5.1)
Sodium: 139 mmol/L (ref 135–145)
Total Protein: 7.3 g/dL (ref 6.5–8.1)

## 2018-07-24 LAB — SURGICAL PCR SCREEN
MRSA, PCR: POSITIVE — AB
Staphylococcus aureus: POSITIVE — AB

## 2018-07-24 LAB — HIV ANTIBODY (ROUTINE TESTING W REFLEX): HIV Screen 4th Generation wRfx: NONREACTIVE

## 2018-07-24 SURGERY — LAPAROSCOPIC CHOLECYSTECTOMY WITH INTRAOPERATIVE CHOLANGIOGRAM
Anesthesia: General

## 2018-07-24 MED ORDER — PROMETHAZINE HCL 25 MG/ML IJ SOLN
INTRAMUSCULAR | Status: AC
  Filled 2018-07-24: qty 1

## 2018-07-24 MED ORDER — SCOPOLAMINE 1 MG/3DAYS TD PT72: 1.0000 | MEDICATED_PATCH | TRANSDERMAL | Status: DC

## 2018-07-24 MED ORDER — LEVOTHYROXINE SODIUM 75 MCG PO TABS
75.0000 ug | ORAL_TABLET | Freq: Every day | ORAL | Status: DC
  Administered 2018-07-24 – 2018-07-30 (×5): 75 ug via ORAL
  Filled 2018-07-24 (×7): qty 1

## 2018-07-24 MED ORDER — ALPRAZOLAM 1 MG PO TABS
1.0000 mg | ORAL_TABLET | Freq: Two times a day (BID) | ORAL | Status: DC | PRN
  Administered 2018-07-27 – 2018-07-29 (×3): 1 mg via ORAL
  Filled 2018-07-24 (×5): qty 1

## 2018-07-24 MED ORDER — LORAZEPAM 2 MG/ML IJ SOLN
0.5000 mg | Freq: Four times a day (QID) | INTRAMUSCULAR | Status: DC | PRN
  Administered 2018-07-24 – 2018-07-25 (×2): 0.5 mg via INTRAVENOUS
  Filled 2018-07-24 (×2): qty 1

## 2018-07-24 MED ORDER — SODIUM CHLORIDE 0.9 % IV SOLN
2.0000 g | INTRAVENOUS | Status: DC
  Administered 2018-07-24 – 2018-07-26 (×3): 2 g via INTRAVENOUS
  Filled 2018-07-24 (×3): qty 2

## 2018-07-24 MED ORDER — ONDANSETRON HCL 4 MG/2ML IJ SOLN
INTRAMUSCULAR | Status: AC
  Filled 2018-07-24: qty 2

## 2018-07-24 MED ORDER — ROCURONIUM BROMIDE 10 MG/ML (PF) SYRINGE
PREFILLED_SYRINGE | INTRAVENOUS | Status: AC
  Filled 2018-07-24: qty 10

## 2018-07-24 MED ORDER — MIDAZOLAM HCL 5 MG/5ML IJ SOLN
INTRAMUSCULAR | Status: DC | PRN
  Administered 2018-07-24 (×2): 2 mg via INTRAVENOUS

## 2018-07-24 MED ORDER — MORPHINE SULFATE (PF) 2 MG/ML IV SOLN
1.0000 mg | INTRAVENOUS | Status: DC | PRN
  Administered 2018-07-24 – 2018-07-26 (×4): 2 mg via INTRAVENOUS
  Filled 2018-07-24 (×5): qty 1

## 2018-07-24 MED ORDER — ACETAMINOPHEN 10 MG/ML IV SOLN
INTRAVENOUS | Status: AC
  Filled 2018-07-24: qty 100

## 2018-07-24 MED ORDER — MUPIROCIN 2 % EX OINT
1.0000 "application " | TOPICAL_OINTMENT | Freq: Two times a day (BID) | CUTANEOUS | Status: AC
  Administered 2018-07-24 – 2018-07-28 (×9): 1 via NASAL
  Filled 2018-07-24 (×3): qty 22

## 2018-07-24 MED ORDER — LACTATED RINGERS IV SOLN
INTRAVENOUS | Status: DC
  Administered 2018-07-24 – 2018-07-30 (×7): via INTRAVENOUS

## 2018-07-24 MED ORDER — PROMETHAZINE HCL 25 MG/ML IJ SOLN
6.2500 mg | INTRAMUSCULAR | Status: AC | PRN
  Administered 2018-07-24: 6.25 mg via INTRAVENOUS

## 2018-07-24 MED ORDER — SERTRALINE HCL 100 MG PO TABS
100.0000 mg | ORAL_TABLET | Freq: Every day | ORAL | Status: DC
  Administered 2018-07-24 – 2018-07-30 (×5): 100 mg via ORAL
  Filled 2018-07-24 (×6): qty 1

## 2018-07-24 MED ORDER — SODIUM CHLORIDE (PF) 0.9 % IJ SOLN
INTRAMUSCULAR | Status: DC | PRN
  Administered 2018-07-24: 20 mL

## 2018-07-24 MED ORDER — FENTANYL CITRATE (PF) 100 MCG/2ML IJ SOLN
INTRAMUSCULAR | Status: DC | PRN
  Administered 2018-07-24: 50 ug via INTRAVENOUS
  Administered 2018-07-24: 100 ug via INTRAVENOUS
  Administered 2018-07-24: 50 ug via INTRAVENOUS

## 2018-07-24 MED ORDER — DIPHENHYDRAMINE HCL 50 MG/ML IJ SOLN
INTRAMUSCULAR | Status: AC
  Filled 2018-07-24: qty 1

## 2018-07-24 MED ORDER — SCOPOLAMINE 1 MG/3DAYS TD PT72
1.0000 | MEDICATED_PATCH | Freq: Once | TRANSDERMAL | Status: DC
  Administered 2018-07-24: 1.5 mg via TRANSDERMAL
  Filled 2018-07-24: qty 1

## 2018-07-24 MED ORDER — LIP MEDEX EX OINT
TOPICAL_OINTMENT | CUTANEOUS | Status: AC
  Administered 2018-07-24: 11:00:00
  Filled 2018-07-24: qty 7

## 2018-07-24 MED ORDER — IOPAMIDOL (ISOVUE-300) INJECTION 61%
INTRAVENOUS | Status: AC
  Filled 2018-07-24: qty 50

## 2018-07-24 MED ORDER — HYDROCODONE-ACETAMINOPHEN 5-325 MG PO TABS
1.0000 | ORAL_TABLET | ORAL | Status: DC | PRN
  Administered 2018-07-24 – 2018-07-25 (×3): 2 via ORAL
  Administered 2018-07-25: 1 via ORAL
  Filled 2018-07-24: qty 1
  Filled 2018-07-24 (×3): qty 2

## 2018-07-24 MED ORDER — GABAPENTIN 300 MG PO CAPS
1200.0000 mg | ORAL_CAPSULE | Freq: Every day | ORAL | Status: DC
  Administered 2018-07-24 – 2018-07-29 (×6): 1200 mg via ORAL
  Filled 2018-07-24 (×7): qty 4

## 2018-07-24 MED ORDER — BUPIVACAINE-EPINEPHRINE (PF) 0.25% -1:200000 IJ SOLN
INTRAMUSCULAR | Status: DC | PRN
  Administered 2018-07-24: 20 mL

## 2018-07-24 MED ORDER — BUPIVACAINE-EPINEPHRINE (PF) 0.25% -1:200000 IJ SOLN
INTRAMUSCULAR | Status: AC
  Filled 2018-07-24: qty 30

## 2018-07-24 MED ORDER — LIDOCAINE 2% (20 MG/ML) 5 ML SYRINGE
INTRAMUSCULAR | Status: AC
  Filled 2018-07-24: qty 5

## 2018-07-24 MED ORDER — IOPAMIDOL (ISOVUE-300) INJECTION 61%
INTRAVENOUS | Status: DC | PRN
  Administered 2018-07-24: 20 mL

## 2018-07-24 MED ORDER — POTASSIUM CHLORIDE IN NACL 20-0.45 MEQ/L-% IV SOLN
INTRAVENOUS | Status: DC
  Administered 2018-07-25 – 2018-07-26 (×3): via INTRAVENOUS
  Filled 2018-07-24 (×4): qty 1000

## 2018-07-24 MED ORDER — POTASSIUM CHLORIDE IN NACL 20-0.45 MEQ/L-% IV SOLN
INTRAVENOUS | Status: DC
  Administered 2018-07-24: 04:00:00 via INTRAVENOUS
  Filled 2018-07-24 (×2): qty 1000

## 2018-07-24 MED ORDER — OXYCODONE HCL 5 MG/5ML PO SOLN: 5.0000 mg | Freq: Once | ORAL | Status: DC | PRN

## 2018-07-24 MED ORDER — POLYVINYL ALCOHOL 1.4 % OP SOLN: 1.0000 [drp] | Freq: Every day | OPHTHALMIC | Status: DC | PRN

## 2018-07-24 MED ORDER — FLUTICASONE PROPIONATE 50 MCG/ACT NA SUSP
2.0000 | Freq: Every day | NASAL | Status: DC
  Administered 2018-07-25 – 2018-07-30 (×5): 2 via NASAL
  Filled 2018-07-24 (×3): qty 16

## 2018-07-24 MED ORDER — OXYCODONE HCL 5 MG PO TABS: 5.0000 mg | ORAL_TABLET | Freq: Once | ORAL | Status: DC | PRN

## 2018-07-24 MED ORDER — DIPHENHYDRAMINE HCL 50 MG/ML IJ SOLN
INTRAMUSCULAR | Status: DC | PRN
  Administered 2018-07-24: 12.5 mg via INTRAVENOUS

## 2018-07-24 MED ORDER — TRAMADOL HCL 50 MG PO TABS
50.0000 mg | ORAL_TABLET | Freq: Four times a day (QID) | ORAL | Status: DC | PRN
  Administered 2018-07-25 – 2018-07-27 (×3): 50 mg via ORAL
  Filled 2018-07-24 (×3): qty 1

## 2018-07-24 MED ORDER — SUGAMMADEX SODIUM 500 MG/5ML IV SOLN
INTRAVENOUS | Status: AC
  Filled 2018-07-24: qty 5

## 2018-07-24 MED ORDER — SUGAMMADEX SODIUM 200 MG/2ML IV SOLN
INTRAVENOUS | Status: AC
  Filled 2018-07-24: qty 2

## 2018-07-24 MED ORDER — ZOLPIDEM TARTRATE 5 MG PO TABS
5.0000 mg | ORAL_TABLET | Freq: Every evening | ORAL | Status: DC | PRN
  Administered 2018-07-25 – 2018-07-29 (×4): 5 mg via ORAL
  Filled 2018-07-24 (×5): qty 1

## 2018-07-24 MED ORDER — ESTRADIOL 0.05 MG/24HR TD PTWK
0.0500 mg | MEDICATED_PATCH | TRANSDERMAL | Status: DC
  Administered 2018-07-30: 0.05 mg via TRANSDERMAL
  Filled 2018-07-24: qty 1

## 2018-07-24 MED ORDER — PROPOFOL 10 MG/ML IV BOLUS
INTRAVENOUS | Status: AC
  Filled 2018-07-24: qty 20

## 2018-07-24 MED ORDER — MIDAZOLAM HCL 2 MG/2ML IJ SOLN
INTRAMUSCULAR | Status: AC
  Filled 2018-07-24: qty 2

## 2018-07-24 MED ORDER — ZOLPIDEM TARTRATE 5 MG PO TABS
10.0000 mg | ORAL_TABLET | Freq: Every evening | ORAL | Status: DC | PRN
  Administered 2018-07-24: 10 mg via ORAL
  Filled 2018-07-24: qty 1

## 2018-07-24 MED ORDER — LIDOCAINE 2% (20 MG/ML) 5 ML SYRINGE
INTRAMUSCULAR | Status: DC | PRN
  Administered 2018-07-24: 80 mg via INTRAVENOUS

## 2018-07-24 MED ORDER — HYDROMORPHONE HCL 1 MG/ML IJ SOLN
1.0000 mg | INTRAMUSCULAR | Status: DC | PRN
  Administered 2018-07-24 – 2018-07-27 (×7): 1 mg via INTRAVENOUS
  Filled 2018-07-24 (×6): qty 1

## 2018-07-24 MED ORDER — FENTANYL CITRATE (PF) 100 MCG/2ML IJ SOLN
INTRAMUSCULAR | Status: AC
  Filled 2018-07-24: qty 2

## 2018-07-24 MED ORDER — FENTANYL CITRATE (PF) 100 MCG/2ML IJ SOLN
25.0000 ug | INTRAMUSCULAR | Status: DC | PRN
  Administered 2018-07-24 (×4): 25 ug via INTRAVENOUS

## 2018-07-24 MED ORDER — PROPOFOL 10 MG/ML IV BOLUS
INTRAVENOUS | Status: DC | PRN
  Administered 2018-07-24: 200 mg via INTRAVENOUS

## 2018-07-24 MED ORDER — SUCCINYLCHOLINE CHLORIDE 200 MG/10ML IV SOSY
PREFILLED_SYRINGE | INTRAVENOUS | Status: DC | PRN
  Administered 2018-07-24: 140 mg via INTRAVENOUS

## 2018-07-24 MED ORDER — ONDANSETRON HCL 4 MG/2ML IJ SOLN
INTRAMUSCULAR | Status: DC | PRN
  Administered 2018-07-24: 4 mg via INTRAVENOUS

## 2018-07-24 MED ORDER — KETOROLAC TROMETHAMINE 30 MG/ML IJ SOLN: 30.0000 mg | Freq: Three times a day (TID) | INTRAMUSCULAR | Status: DC | PRN

## 2018-07-24 MED ORDER — ONDANSETRON HCL 4 MG/2ML IJ SOLN
4.0000 mg | Freq: Four times a day (QID) | INTRAMUSCULAR | Status: DC | PRN
  Administered 2018-07-24 – 2018-07-29 (×6): 4 mg via INTRAVENOUS
  Filled 2018-07-24 (×6): qty 2

## 2018-07-24 MED ORDER — KETOROLAC TROMETHAMINE 30 MG/ML IJ SOLN: 30.0000 mg | Freq: Four times a day (QID) | INTRAMUSCULAR | Status: DC | PRN

## 2018-07-24 MED ORDER — ONDANSETRON 4 MG PO TBDP
4.0000 mg | ORAL_TABLET | Freq: Four times a day (QID) | ORAL | Status: DC | PRN
  Administered 2018-07-25 – 2018-07-28 (×3): 4 mg via ORAL
  Filled 2018-07-24 (×3): qty 1

## 2018-07-24 MED ORDER — DEXAMETHASONE SODIUM PHOSPHATE 10 MG/ML IJ SOLN
INTRAMUSCULAR | Status: DC | PRN
  Administered 2018-07-24: 10 mg via INTRAVENOUS

## 2018-07-24 MED ORDER — SUGAMMADEX SODIUM 500 MG/5ML IV SOLN
INTRAVENOUS | Status: DC | PRN
  Administered 2018-07-24: 350 mg via INTRAVENOUS

## 2018-07-24 MED ORDER — DEXAMETHASONE SODIUM PHOSPHATE 10 MG/ML IJ SOLN
INTRAMUSCULAR | Status: AC
  Filled 2018-07-24: qty 1

## 2018-07-24 MED ORDER — LACTATED RINGERS IR SOLN
Status: DC | PRN
  Administered 2018-07-24: 300 mL

## 2018-07-24 MED ORDER — FENTANYL CITRATE (PF) 100 MCG/2ML IJ SOLN
INTRAMUSCULAR | Status: AC
  Administered 2018-07-24: 25 ug via INTRAVENOUS
  Filled 2018-07-24: qty 2

## 2018-07-24 MED ORDER — ACETAMINOPHEN 10 MG/ML IV SOLN
1000.0000 mg | Freq: Once | INTRAVENOUS | Status: DC | PRN
  Administered 2018-07-24: 1000 mg via INTRAVENOUS

## 2018-07-24 MED ORDER — ROCURONIUM BROMIDE 10 MG/ML (PF) SYRINGE
PREFILLED_SYRINGE | INTRAVENOUS | Status: DC | PRN
  Administered 2018-07-24: 10 mg via INTRAVENOUS
  Administered 2018-07-24: 50 mg via INTRAVENOUS

## 2018-07-24 SURGICAL SUPPLY — 37 items
ADH SKN CLS APL DERMABOND .7 (GAUZE/BANDAGES/DRESSINGS) ×1
APPLIER CLIP ROT 10 11.4 M/L (STAPLE) ×3
APR CLP MED LRG 11.4X10 (STAPLE) ×1
BAG SPEC RTRVL LRG 6X4 10 (ENDOMECHANICALS) ×1
CABLE HIGH FREQUENCY MONO STRZ (ELECTRODE) ×3 IMPLANT
CHLORAPREP W/TINT 26ML (MISCELLANEOUS) ×6 IMPLANT
CLIP APPLIE ROT 10 11.4 M/L (STAPLE) ×1 IMPLANT
CLOSURE WOUND 1/2 X4 (GAUZE/BANDAGES/DRESSINGS) ×1
COVER MAYO STAND STRL (DRAPES) ×3 IMPLANT
COVER SURGICAL LIGHT HANDLE (MISCELLANEOUS) ×3 IMPLANT
COVER WAND RF STERILE (DRAPES) IMPLANT
DECANTER SPIKE VIAL GLASS SM (MISCELLANEOUS) ×3 IMPLANT
DERMABOND ADVANCED (GAUZE/BANDAGES/DRESSINGS) ×2
DERMABOND ADVANCED .7 DNX12 (GAUZE/BANDAGES/DRESSINGS) IMPLANT
DRAPE C-ARM 42X120 X-RAY (DRAPES) ×3 IMPLANT
ELECT REM PT RETURN 15FT ADLT (MISCELLANEOUS) ×3 IMPLANT
GAUZE SPONGE 2X2 8PLY STRL LF (GAUZE/BANDAGES/DRESSINGS) ×1 IMPLANT
GLOVE SURG ORTHO 8.0 STRL STRW (GLOVE) ×3 IMPLANT
GOWN STRL REUS W/TWL XL LVL3 (GOWN DISPOSABLE) ×6 IMPLANT
HEMOSTAT SURGICEL 4X8 (HEMOSTASIS) IMPLANT
KIT BASIN OR (CUSTOM PROCEDURE TRAY) ×3 IMPLANT
KIT TURNOVER KIT A (KITS) IMPLANT
POUCH SPECIMEN RETRIEVAL 10MM (ENDOMECHANICALS) ×3 IMPLANT
SCISSORS LAP 5X35 DISP (ENDOMECHANICALS) ×3 IMPLANT
SET CHOLANGIOGRAPH MIX (MISCELLANEOUS) ×3 IMPLANT
SET IRRIG TUBING LAPAROSCOPIC (IRRIGATION / IRRIGATOR) ×3 IMPLANT
SET TUBE SMOKE EVAC HIGH FLOW (TUBING) IMPLANT
SLEEVE XCEL OPT CAN 5 100 (ENDOMECHANICALS) ×3 IMPLANT
SPONGE GAUZE 2X2 STER 10/PKG (GAUZE/BANDAGES/DRESSINGS) ×2
STRIP CLOSURE SKIN 1/2X4 (GAUZE/BANDAGES/DRESSINGS) ×2 IMPLANT
SUT MNCRL AB 4-0 PS2 18 (SUTURE) ×3 IMPLANT
TOWEL OR 17X26 10 PK STRL BLUE (TOWEL DISPOSABLE) ×3 IMPLANT
TOWEL OR NON WOVEN STRL DISP B (DISPOSABLE) ×3 IMPLANT
TRAY LAPAROSCOPIC (CUSTOM PROCEDURE TRAY) ×3 IMPLANT
TROCAR BLADELESS OPT 5 100 (ENDOMECHANICALS) ×3 IMPLANT
TROCAR XCEL BLUNT TIP 100MML (ENDOMECHANICALS) ×3 IMPLANT
TROCAR XCEL NON-BLD 11X100MML (ENDOMECHANICALS) ×3 IMPLANT

## 2018-07-24 NOTE — Transfer of Care (Signed)
Immediate Anesthesia Transfer of Care Note  Patient: [name]  Procedure(s) Performed: LAPAROSCOPIC CHOLECYSTECTOMY WITH INTRAOPERATIVE CHOLANGIOGRAM (N/A )  Patient Location: PACU  Anesthesia Type:General  Level of Consciousness: awake, alert  and oriented  Airway & Oxygen Therapy: Patient Spontanous Breathing and Patient connected to face mask oxygen  Post-op Assessment: Report given to RN and Post -op Vital signs reviewed and stable  Post vital signs: Reviewed and stable  Last Vitals:  Vitals Value Taken Time  BP 153/89 07/24/2018  4:15 PM  Temp    Pulse 90 07/24/2018  4:16 PM  Resp 13 07/24/2018  4:16 PM  SpO2 100 % 07/24/2018  4:16 PM  Vitals shown include unvalidated device data.  Last Pain:  Vitals:   07/24/18 1328  TempSrc:   PainSc: 6       Patients Stated Pain Goal: 2 (54/65/03 5465)  Complications: No apparent anesthesia complications

## 2018-07-24 NOTE — ED Notes (Signed)
ED TO INPATIENT HANDOFF REPORT  ED Nurse Name and Phone #: Hildred Alamin RN   S Name/Age/Gender [name] 48 y.o. female Room/Bed: RESA/RESA  Code Status   Code Status: Not on file  Home/SNF/Other Home Patient oriented to: self, place, time and situation Is this baseline? Yes      Chief Complaint Abd. Pain  Triage Note Per EMS- Patient c/o RLQ abdominal pain that started  Today. Patient reports a history of ovarian cyst.  Patient was given Fentanyl 100 mcg intranasally prior to arrival to the ED.  Pt c/o sharp RLQ pain that began suddenly today and radiates across her abdomen. Pt also reports some mild tenderness in the right lower back.   Allergies Allergies  Allergen Reactions  . Moxifloxacin Anaphylaxis    needs epinephrine shot  . Quinolones Anaphylaxis, Shortness Of Breath and Swelling  . Doxycycline Nausea And Vomiting  . Escitalopram Oxalate Other (See Comments)     fatigue  . Sulfonamide Derivatives Swelling and Rash    needs epinephrine shot--rash and lip swelling    Level of Care/Admitting Diagnosis ED Disposition    ED Disposition Condition Comment   Admit  Hospital Area: [place] [[id number]]  Level of Care: Med-Surg [16]  Diagnosis: Cholecystitis with cholelithiasis [542706]  Admitting Physician: CCS, [place]  Attending Physician: CCS, [place]  Bed request comments: 5 west  PT Class (Do Not Modify): Observation [104]  PT Acc Code (Do Not Modify): Observation [10022]       B Medical/Surgery History Past Medical History:  Diagnosis Date  . Anxiety   . Elevated blood pressure, situational 05/05/2016  . Endometriosis   . Exercise-induced asthma 09/08/2015  . Ovarian cancer, bilateral ([place]) 11/28/2014  . PONV (postoperative nausea and vomiting)   . Thyroid disease    Past Surgical History:  Procedure Laterality Date  . ABDOMINAL HYSTERECTOMY  11/10/14   at [facility], Exp lap, supracervical hyst, BSO, infracolic omentectomy,  lymphadenectomy, aortic lymph node sampling  . IR GENERIC HISTORICAL  05/12/2016   IR REMOVAL TUN ACCESS W/ PORT W/O FL MOD SED 05/12/2016 [facility]-INTERV RAD     A IV Location/Drains/Wounds Patient Lines/Drains/Airways Status   Active Line/Drains/Airways    Name:   Placement date:   Placement time:   Site:   Days:   Peripheral IV 07/23/18 Right Antecubital   07/23/18    2032    Antecubital   1          Intake/Output Last 24 hours  Intake/Output Summary (Last 24 hours) at 07/24/2018 0229 Last data filed at 07/24/2018 0011 Gross per 24 hour  Intake 1000 ml  Output -  Net 1000 ml    Labs/Imaging Results for orders placed or performed during the hospital encounter of 07/23/18 (from the past 48 hour(s))  Lipase, blood     Status: None   Collection Time: 07/23/18  6:50 PM  Result Value Ref Range   Lipase 38 11 - 51 U/L    Comment: Performed at [facility], [place] [address], [place], [place] [address]  Comprehensive metabolic panel     Status: Abnormal   Collection Time: 07/23/18  6:50 PM  Result Value Ref Range   Sodium 138 135 - 145 mmol/L   Potassium 4.0 3.5 - 5.1 mmol/L   Chloride 104 98 - 111 mmol/L   CO2 26 22 - 32 mmol/L   Glucose, Bld 152 (H) 70 - 99 mg/dL   BUN 13 6 - 20 mg/dL  Creatinine, Ser 0.83 0.44 - 1.00 mg/dL   Calcium 9.5 8.9 - 10.3 mg/dL   Total Protein 8.4 (H) 6.5 - 8.1 g/dL   Albumin 4.6 3.5 - 5.0 g/dL   AST 116 (H) 15 - 41 U/L   ALT 52 (H) 0 - 44 U/L   Alkaline Phosphatase 68 38 - 126 U/L   Total Bilirubin 1.6 (H) 0.3 - 1.2 mg/dL   GFR calc non Af Amer >60 >60 mL/min   GFR calc Af Amer >60 >60 mL/min   Anion gap 8 5 - 15    Comment: Performed at [facility], [place] [address] [place], [place] [address]  CBC     Status: Abnormal   Collection Time: 07/23/18  6:50 PM  Result Value Ref Range   WBC 17.5 (H) 4.0 - 10.5 K/uL   RBC 4.68 3.87 - 5.11 MIL/uL   Hemoglobin 14.0 12.0 - 15.0 g/dL   HCT 43.6 36.0 - 46.0 %   MCV 93.2  80.0 - 100.0 fL   MCH 29.9 26.0 - 34.0 pg   MCHC 32.1 30.0 - 36.0 g/dL   RDW 12.7 11.5 - 15.5 %   Platelets 282 150 - 400 K/uL   nRBC 0.0 0.0 - 0.2 %    Comment: Performed at [facility], [place] [address], [place], [place] [address]  Urinalysis, Routine w reflex microscopic     Status: Abnormal   Collection Time: 07/23/18  8:12 PM  Result Value Ref Range   Color, Urine YELLOW YELLOW   APPearance CLOUDY (A) CLEAR   Specific Gravity, Urine 1.016 1.005 - 1.030   pH 7.0 5.0 - 8.0   Glucose, UA NEGATIVE NEGATIVE mg/dL   Hgb urine dipstick NEGATIVE NEGATIVE   Bilirubin Urine NEGATIVE NEGATIVE   Ketones, ur NEGATIVE NEGATIVE mg/dL   Protein, ur NEGATIVE NEGATIVE mg/dL   Nitrite NEGATIVE NEGATIVE   Leukocytes,Ua NEGATIVE NEGATIVE    Comment: Performed at [facility] [address] [place], [place] [address]   US Abdomen Limited Ruq  Result Date: 07/23/2018 CLINICAL DATA:  Right upper quadrant pain EXAM: ULTRASOUND ABDOMEN LIMITED RIGHT UPPER QUADRANT COMPARISON:  10/11/2017 FINDINGS: Gallbladder: Numerous layering gallstones within the gallbladder. No wall thickening or sonographic Murphy's sign. Small anterior gallbladder wall polyp. Common bile duct: Diameter: Normal caliber, 5 mm Liver: No focal lesion identified. Within normal limits in parenchymal echogenicity. Portal vein is patent on color Doppler imaging with normal direction of blood flow towards the liver. IMPRESSION: Cholelithiasis.  No sonographic evidence of acute cholecystitis. No acute findings. Electronically Signed   By: Rolm Baptise M.D.   On: 07/23/2018 22:10    Pending Labs Unresulted Labs (From admission, onward)    Start     Ordered   07/24/18 0500  CA 125  Tomorrow morning,   R     07/24/18 0113   Signed and Held  HIV antibody (Routine Testing)  Once,   R     Signed and Held   Signed and Held  Comprehensive metabolic panel  Tomorrow morning,   R     Signed and Held   Signed  and Held  CBC WITH DIFFERENTIAL  Once,   R     Signed and Held          Vitals/Pain Today's Vitals   07/24/18 0200 07/24/18 0216 07/24/18 0223 07/24/18 0223  BP:    112/70  Pulse: 85 72  69  Resp:   18  15  Temp:    98.1 F (36.7 C)  TempSrc:      SpO2: 99% 99% 98% 100%  Weight:      Height:      PainSc:        Isolation Precautions No active isolations  Medications Medications  sodium chloride flush (NS) 0.9 % injection 3 mL (has no administration in time range)  ALPRAZolam (XANAX) tablet 1 mg (has no administration in time range)  zolpidem (AMBIEN) tablet 10 mg (has no administration in time range)  traMADol (ULTRAM) tablet 50 mg (has no administration in time range)  HYDROcodone-acetaminophen (NORCO/VICODIN) 5-325 MG per tablet 1-2 tablet (has no administration in time range)  morphine 2 MG/ML injection 1-3 mg (has no administration in time range)  ondansetron (ZOFRAN-ODT) disintegrating tablet 4 mg (has no administration in time range)    Or  ondansetron (ZOFRAN) injection 4 mg (has no administration in time range)  ondansetron (ZOFRAN-ODT) disintegrating tablet 4 mg (4 mg Oral Given 07/23/18 1808)  sodium chloride 0.9 % bolus 1,000 mL (0 mLs Intravenous Stopped 07/24/18 0011)  morphine 4 MG/ML injection 4 mg (4 mg Intravenous Given 07/23/18 2044)  ondansetron (ZOFRAN) injection 4 mg (4 mg Intravenous Given 07/23/18 2044)  morphine 4 MG/ML injection 4 mg (4 mg Intravenous Given 07/23/18 2149)  HYDROmorphone (DILAUDID) injection 1 mg (1 mg Intravenous Given 07/24/18 0019)  ondansetron (ZOFRAN) injection 4 mg (4 mg Intravenous Given 07/24/18 0019)    Mobility walks Low fall risk   Focused Assessments Neuro Assessment Handoff:  Swallow screen pass? Yes          Neuro Assessment:   Neuro Checks:      Last Documented NIHSS Modified Score:   Has TPA been given? No If patient is a Neuro Trauma and patient is going to OR before floor call report to [place] nurse:  [phone] or [phone]     R Recommendations: See Admitting Provider Note  Report given to:   Additional Notes: Gallbladder Surgery

## 2018-07-24 NOTE — Anesthesia Postprocedure Evaluation (Signed)
Anesthesia Post Note  Patient: [name]  Procedure(s) Performed: LAPAROSCOPIC CHOLECYSTECTOMY WITH INTRAOPERATIVE CHOLANGIOGRAM (N/A )     Patient location during evaluation: PACU Anesthesia Type: General Level of consciousness: awake and alert Pain management: pain level controlled Vital Signs Assessment: post-procedure vital signs reviewed and stable Respiratory status: spontaneous breathing, nonlabored ventilation and respiratory function stable Cardiovascular status: blood pressure returned to baseline and stable Postop Assessment: no apparent nausea or vomiting Anesthetic complications: no    Last Vitals:  Vitals:   07/24/18 1700 07/24/18 1715  BP: (!) 143/80 118/60  Pulse: 82 84  Resp: 13 18  Temp:  37.1 C  SpO2: 99% 100%    Last Pain:  Vitals:   07/24/18 1715  TempSrc:   PainSc: [place]

## 2018-07-24 NOTE — Op Note (Signed)
Procedure Note  Pre-operative Diagnosis:  Chronic cholecystitis, cholelithiasis, elevated LFT's  Post-operative Diagnosis:  same  Surgeon:  Armandina Gemma, MD  Assistant:  Will Creig Hines, PA-C   Procedure:  Laparoscopic cholecystectomy with intra-operative cholangiography  Anesthesia:  General  Estimated Blood Loss:  minimal  Drains: none         Specimen: gallbladder to pathology  Indications:  The patient had some vague abdominal pain about 1 month ago.  She vomited and felt better.  She did not seek medical attention at that time.             Today she was walking with her fianc when she developed severe epigastric and right upper quadrant pain.  Because of the severity of the pain, she was brought to the [facility] emergency room by ambulance.             She has a prior history of a total abdominal hysterectomy and bilateral salpingo-oophorectomy in July 2016 for bilateral clear-cell ovarian carcinoma.  The surgery was done in [facility].  She underwent chemotherapy supervised by Dr. Marko Plume.  She is now followed by Dr. Denman George.             She denies any other GI problems.  She has had no history of stomach, liver, pancreas disease. [place] - 07/23/2018 -s hows cholelithiasis CT of abdomen - 10/11/2017 - left renal calculus, no recurrent carcinoman  Procedure Details:  The patient was seen in the pre-op holding area. The risks, benefits, complications, treatment options, and expected outcomes were previously discussed with the patient. The patient agreed with the proposed plan and has signed the informed consent form.  The patient was transported to operating room # 4 at the [facility]. The patient was placed in the supine position on the operating room table. Following induction of general anesthesia, the abdomen was prepped and draped in the usual aseptic fashion.  An incision was made in the skin near the umbilicus. The midline fascia was incised and the peritoneal cavity was entered  and a Hasson cannula was introduced under direct vision. The cannula was secured with a 0-Vicryl pursestring suture. Pneumoperitoneum was established with carbon dioxide. Additional cannulae were introduced under direct vision along the right costal margin in the midline, mid-clavicular line, and anterior axillary line.   The gallbladder was identified and the fundus grasped and retracted cephalad. Adhesions were taken down bluntly and the electrocautery was utilized as needed, taking care not to involve any adjacent structures. The infundibulum was grasped and retracted laterally, exposing the peritoneum overlying the triangle of Calot. The peritoneum was incised and structures exposed with blunt dissection. The cystic duct was clearly identified, bluntly dissected circumferentially, and clipped at the neck of the gallbladder.  An incision was made in the cystic duct and the cholangiogram catheter introduced. The catheter was secured using an ligaclip.  Real-time cholangiography was performed using C-arm fluoroscopy.  There was rapid filling of a normal caliber common bile duct.  There was reflux of contrast into the left and right hepatic ductal systems.  There was flow distally but no flow into the duodenum.  There is suspicion of distal CBD obstruction since there was considerable draining from the cystic duct both before and after cholangiography.  The catheter was removed from the peritoneal cavity.  The cystic duct was then ligated with ligaclips and divided. The cystic artery was identified, dissected circumferentially, ligated with ligaclips, and divided.  The gallbladder was dissected away from the  gallbladder bed using the electrocautery for hemostasis. The gallbladder was completely removed from the liver and placed into an endocatch bag. The gallbladder was removed in the endocatch bag through the umbilical port site and submitted to pathology for review.  The right upper quadrant was  irrigated and the gallbladder bed was inspected. Hemostasis was achieved with the electrocautery.  Cannulae were removed under direct vision and good hemostasis was noted. Pneumoperitoneum was released and the majority of the carbon dioxide evacuated. The umbilical wound was irrigated and the fascia was then closed with the pursestring suture.  Local anesthetic was infiltrated at all port sites. Skin incisions were closed with 4-0 Monocril subcuticular sutures and Dermabond was applied.  Instrument, sponge, and needle counts were correct at the conclusion of the case.  The patient was awakened from anesthesia and brought to the recovery room in stable condition.  The patient tolerated the procedure well.   Armandina Gemma, MD [facility] Office: [phone]

## 2018-07-24 NOTE — Anesthesia Preprocedure Evaluation (Signed)
Anesthesia Evaluation  Patient identified by MRN, date of birth, ID band Patient awake    Reviewed: Allergy & Precautions, NPO status , Patient's Chart, lab work & pertinent test results  History of Anesthesia Complications (+) PONV and history of anesthetic complications  Airway Mallampati: II  TM Distance: >3 FB Neck ROM: Full    Dental  (+) Teeth Intact, Dental Advisory Given   Pulmonary asthma , sleep apnea and Continuous Positive Airway Pressure Ventilation ,    Pulmonary exam normal breath sounds clear to auscultation       Cardiovascular hypertension, Normal cardiovascular exam Rhythm:Regular Rate:Normal     Neuro/Psych  Headaches, PSYCHIATRIC DISORDERS Anxiety Depression  Neuromuscular disease    GI/Hepatic GERD  Medicated and Controlled,Cholecystitis   Endo/Other  Hypothyroidism Morbid obesity  Renal/GU negative Renal ROS     Musculoskeletal negative musculoskeletal ROS (+)   Abdominal   Peds  Hematology negative hematology ROS (+)   Anesthesia Other Findings Day of surgery medications reviewed with the patient.    Reproductive/Obstetrics negative OB ROS                             Anesthesia Physical Anesthesia Plan  ASA: III and emergent  Anesthesia Plan: General   Post-op Pain Management:    Induction: Intravenous  PONV Risk Score and Plan: 4 or greater and Diphenhydramine, Scopolamine patch - Pre-op, Midazolam, Dexamethasone and Ondansetron  Airway Management Planned: Oral ETT  Additional Equipment:   Intra-op Plan:   Post-operative Plan: Extubation in OR  Informed Consent: I have reviewed the patients History and Physical, chart, labs and discussed the procedure including the risks, benefits and alternatives for the proposed anesthesia with the patient or authorized representative who has indicated his/her understanding and acceptance.     Dental advisory  given  Plan Discussed with: CRNA  Anesthesia Plan Comments:         Anesthesia Quick Evaluation

## 2018-07-24 NOTE — ED Provider Notes (Signed)
Care assumed from [place]ique Russo, PA-C at shift change with Surgery consult pending.   In brief, this patient is a 48 y.o. F with symptomatic cholelithiasis.  Please see note from previous provider for full history/physical exam.   PLAN: General surgery has been consulted and will come evaluate patient.  MDM: Discussed with Dr. Lucia Gaskins (Gen Surg).  He will admit the patient with plans for surgery tomorrow.   1. Symptomatic cholelithiasis   2. RUQ abdominal pain       Desma Mcgregor 07/24/18 3838    Ezequiel Essex, MD 07/24/18 [phone]

## 2018-07-24 NOTE — ED Notes (Signed)
Respiratory called at this time to place pt on CPAP

## 2018-07-24 NOTE — H&P (View-Only) (Signed)
    TI:[id number] pain  Subjective: Patient is really anxious and teary-eyed this morning.  Ongoing pain right upper quadrant.  Objective: Vital signs in last 24 hours: Temp:  [98.1 F (36.7 C)-98.2 F (36.8 C)] 98.1 F (36.7 C) (03/17 0523) Pulse Rate:  [59-94] 59 (03/17 0523) Resp:  [12-18] 14 (03/17 0523) BP: (104-154)/(42-107) 134/70 (03/17 0523) SpO2:  [92 %-100 %] 96 % (03/17 0523) Weight:  [133.8 kg] 133.8 kg (03/16 1757) Last BM Date: 07/23/18 1000 IV fluid recorded intake Nothing else recorded in the I&O Afebrile vital signs are stable admission blood pressure was elevated. Labs show normal electrolytes.  Glucose 133 this a.m. LFTs are rising. Total bilirubin 1.6>>2.2 AST 116>> 294 ALT 52>> 191 WBC 17.5>> 9.5 CT scan 10/11/2017 for abdominal pain showed no acute findings no evidence of recurrent or metastatic carcinoma.  Gallbladder was unremarkable.  There was a 2 mm nonobstructing left renal calculus. Abdominal ultrasound 3/17: Numerous layering gallstones.  No wall thickening small anterior gallbladder wall polyp.  Common bile duct 5 mm.  Cholelithiasis without evidence of acute cholecystitis.  Intake/Output from previous day: 03/16 0701 - 03/17 0700 In: 1000 [IV Piggyback:1000] Out: 0  Intake/Output this shift: No intake/output data recorded.  General appearance: alert, cooperative and Very anxious with ongoing right upper quadrant abdominal pain. Resp: clear to auscultation bilaterally GI: Morbidly obese with pain right upper quadrant.  Lab Results:  Recent Labs    07/23/18 1850 07/24/18 0554  WBC 17.5* 9.5  HGB 14.0 13.0  HCT 43.6 40.8  PLT 282 234    BMET Recent Labs    07/23/18 1850 07/24/18 0554  NA 138 139  K 4.0 4.1  CL 104 107  CO2 26 25  GLUCOSE 152* 133*  BUN 13 12  CREATININE 0.83 0.84  CALCIUM 9.5 9.1   PT/INR No results for input(s): LABPROT, INR in the last 72 hours.  Recent Labs  Lab 07/23/18 1850 07/24/18 0554  AST  116* 294*  ALT 52* 191*  ALKPHOS 68 74  BILITOT 1.6* 2.2*  PROT 8.4* 7.3  ALBUMIN 4.6 3.9     Lipase     Component Value Date/Time   LIPASE 38 07/23/2018 1850     Medications: . [START ON 07/30/2018] estradiol  0.05 mg Transdermal Weekly  . fluticasone  2 spray Each Nare Daily  . gabapentin  1,200 mg Oral QHS  . levothyroxine  75 mcg Oral Q0600  . mupirocin ointment  1 application Nasal BID  . sertraline  100 mg Oral Daily   . 0.45 % NaCl with KCl 20 mEq / L 125 mL/hr at 07/24/18 0404  . cefTRIAXone (ROCEPHIN)  IV 2 g (07/24/18 0408)    Assessment/Plan Hx of ovarian cancer/ BSS/TAH/chemotherapy -2016 Hypothyroid Hx anxiety Lower extremity neuropathy Morbid obesity -BMI 44.8 Hx exercise-induced asthma  Cholelithiasis/cholecystitis  FEN: IV fluids/n.p.o. ID: Rocephin 3/17 >> day 1 DVT: SCDs Follow-up: [facility] clinic  Plan: Patient will need surgery as soon as possible.  We are going to try and do her later today if the schedule will allow.  I am concerned she may have passed a stone, with rising LFTs.  I am going to keep her on IV fluids, keep her n.p.o., she is on Rocephin.  I can give her some IV Ativan for her anxiety, and we will decide on surgery based on how the schedule proceeds.    LOS: 0 days    Soren Lazarz 07/24/2018 [phone]

## 2018-07-24 NOTE — Interval H&P Note (Signed)
History and Physical Interval Note:  07/24/2018 2:11 PM  [name]  has presented today for surgery, with the diagnosis of cholecystitis.  The various methods of treatment have been discussed with the patient and family. After consideration of risks, benefits and other options for treatment, the patient has consented to    Procedure(s): LAPAROSCOPIC CHOLECYSTECTOMY WITH INTRAOPERATIVE CHOLANGIOGRAM (N/A) as a surgical intervention.    The patient's history has been reviewed, patient examined, no change in status, stable for surgery.  I have reviewed the patient's chart and labs.  Questions were answered to the patient's satisfaction.    Armandina Gemma, [facility] Office: [place]

## 2018-07-24 NOTE — Progress Notes (Signed)
    IW:PYKDXIPJA pain  Subjective: Patient is really anxious and teary-eyed this morning.  Ongoing pain right upper quadrant.  Objective: Vital signs in last 24 hours: Temp:  [98.1 F (36.7 C)-98.2 F (36.8 C)] 98.1 F (36.7 C) (03/17 0523) Pulse Rate:  [59-94] 59 (03/17 0523) Resp:  [12-18] 14 (03/17 0523) BP: (104-154)/(42-107) 134/70 (03/17 0523) SpO2:  [92 %-100 %] 96 % (03/17 0523) Weight:  [133.8 kg] 133.8 kg (03/16 1757) Last BM Date: 07/23/18 1000 IV fluid recorded intake Nothing else recorded in the I&O Afebrile vital signs are stable admission blood pressure was elevated. Labs show normal electrolytes.  Glucose 133 this a.m. LFTs are rising. Total bilirubin 1.6>>2.2 AST 116>> 294 ALT 52>> 191 WBC 17.5>> 9.5 CT scan 10/11/2017 for abdominal pain showed no acute findings no evidence of recurrent or metastatic carcinoma.  Gallbladder was unremarkable.  There was a 2 mm nonobstructing left renal calculus. Abdominal ultrasound 3/17: Numerous layering gallstones.  No wall thickening small anterior gallbladder wall polyp.  Common bile duct 5 mm.  Cholelithiasis without evidence of acute cholecystitis.  Intake/Output from previous day: 03/16 0701 - 03/17 0700 In: 1000 [IV Piggyback:1000] Out: 0  Intake/Output this shift: No intake/output data recorded.  General appearance: alert, cooperative and Very anxious with ongoing right upper quadrant abdominal pain. Resp: clear to auscultation bilaterally GI: Morbidly obese with pain right upper quadrant.  Lab Results:  Recent Labs    07/23/18 1850 07/24/18 0554  WBC 17.5* 9.5  HGB 14.0 13.0  HCT 43.6 40.8  PLT 282 234    BMET Recent Labs    07/23/18 1850 07/24/18 0554  NA 138 139  K 4.0 4.1  CL 104 107  CO2 26 25  GLUCOSE 152* 133*  BUN 13 12  CREATININE 0.83 0.84  CALCIUM 9.5 9.1   PT/INR No results for input(s): LABPROT, INR in the last 72 hours.  Recent Labs  Lab 07/23/18 1850 07/24/18 0554  AST  116* 294*  ALT 52* 191*  ALKPHOS 68 74  BILITOT 1.6* 2.2*  PROT 8.4* 7.3  ALBUMIN 4.6 3.9     Lipase     Component Value Date/Time   LIPASE 38 07/23/2018 1850     Medications: . [START ON 07/30/2018] estradiol  0.05 mg Transdermal Weekly  . fluticasone  2 spray Each Nare Daily  . gabapentin  1,200 mg Oral QHS  . levothyroxine  75 mcg Oral Q0600  . mupirocin ointment  1 application Nasal BID  . sertraline  100 mg Oral Daily   . 0.45 % NaCl with KCl 20 mEq / L 125 mL/hr at 07/24/18 0404  . cefTRIAXone (ROCEPHIN)  IV 2 g (07/24/18 0408)    Assessment/Plan Hx of ovarian cancer/ BSS/TAH/chemotherapy -2016 Hypothyroid Hx anxiety Lower extremity neuropathy Morbid obesity -BMI 44.8 Hx exercise-induced asthma  Cholelithiasis/cholecystitis  FEN: IV fluids/n.p.o. ID: Rocephin 3/17 >> day 1 DVT: SCDs Follow-up: [facility] clinic  Plan: Patient will need surgery as soon as possible.  We are going to try and do her later today if the schedule will allow.  I am concerned she may have passed a stone, with rising LFTs.  I am going to keep her on IV fluids, keep her n.p.o., she is on Rocephin.  I can give her some IV Ativan for her anxiety, and we will decide on surgery based on how the schedule proceeds.    LOS: 0 days    Roxanne Orner 07/24/2018 [phone]

## 2018-07-24 NOTE — Anesthesia Procedure Notes (Signed)
Procedure Name: Intubation Date/Time: 07/24/2018 2:45 PM Performed by: [place] Kylar Leonhardt C, CRNA Pre-anesthesia Checklist: Patient identified, Emergency Drugs available, Suction available and Patient being monitored Patient Re-evaluated:Patient Re-evaluated prior to induction Oxygen Delivery Method: Circle system utilized Preoxygenation: Pre-oxygenation with 100% oxygen Induction Type: IV induction Ventilation: Mask ventilation without difficulty Laryngoscope Size: Mac and 3 Grade View: Grade II Tube type: Oral Tube size: 7.0 mm Number of attempts: 1 Airway Equipment and Method: Stylet and Oral airway Placement Confirmation: ETT inserted through vocal cords under direct vision,  positive ETCO2 and breath sounds checked- equal and bilateral Secured at: 22 cm Tube secured with: Tape Dental Injury: Teeth and Oropharynx as per pre-operative assessment

## 2018-07-25 ENCOUNTER — Other Ambulatory Visit (HOSPITAL_COMMUNITY): Payer: Self-pay

## 2018-07-25 ENCOUNTER — Encounter (HOSPITAL_COMMUNITY): Payer: Self-pay | Admitting: Surgery

## 2018-07-25 ENCOUNTER — Ambulatory Visit (HOSPITAL_COMMUNITY): Payer: Self-pay

## 2018-07-25 ENCOUNTER — Inpatient Hospital Stay (HOSPITAL_COMMUNITY): Payer: Self-pay

## 2018-07-25 DIAGNOSIS — K8043 Calculus of bile duct with acute cholecystitis with obstruction: Secondary | ICD-10-CM

## 2018-07-25 LAB — COMPREHENSIVE METABOLIC PANEL
ALT: 449 U/L — ABNORMAL HIGH (ref 0–44)
AST: 499 U/L — ABNORMAL HIGH (ref 15–41)
Albumin: 3.7 g/dL (ref 3.5–5.0)
Alkaline Phosphatase: 88 U/L (ref 38–126)
Anion gap: 8 (ref 5–15)
BUN: 9 mg/dL (ref 6–20)
CO2: 24 mmol/L (ref 22–32)
CREATININE: 0.69 mg/dL (ref 0.44–1.00)
Calcium: 9.2 mg/dL (ref 8.9–10.3)
Chloride: 106 mmol/L (ref 98–111)
GFR calc Af Amer: >60 mL/min (ref >60)
GFR calc non Af Amer: >60 mL/min (ref >60)
Glucose, Bld: 141 mg/dL — ABNORMAL HIGH (ref 70–99)
Potassium: 4.1 mmol/L (ref 3.5–5.1)
Sodium: 138 mmol/L (ref 135–145)
Total Bilirubin: 2.5 mg/dL — ABNORMAL HIGH (ref 0.3–1.2)
Total Protein: 7.2 g/dL (ref 6.5–8.1)

## 2018-07-25 LAB — CBC
HCT: 41.5 % (ref 36.0–46.0)
Hemoglobin: 12.7 g/dL (ref 12.0–15.0)
MCH: 29.1 pg (ref 26.0–34.0)
MCHC: 30.6 g/dL (ref 30.0–36.0)
MCV: 95.2 fL (ref 80.0–100.0)
Platelets: 243 10*3/uL (ref 150–400)
RBC: 4.36 MIL/uL (ref 3.87–5.11)
RDW: 12.9 % (ref 11.5–15.5)
WBC: 10.1 10*3/uL (ref 4.0–10.5)
nRBC: 0 % (ref 0.0–0.2)

## 2018-07-25 LAB — CA 125: CANCER ANTIGEN (CA) 125: 19.3 U/mL (ref 0.0–38.1)

## 2018-07-25 MED ORDER — LORAZEPAM 2 MG/ML IJ SOLN: 0.5000 mg | Freq: Once | INTRAMUSCULAR | Status: DC

## 2018-07-25 NOTE — Progress Notes (Signed)
1 Day Post-Op    CC: Abdominal pain  Subjective: Patient is doing better this a.m.  Still having some pain more on the lower right side she is pointing to 1 of the lower port sites.  LFTs are up and I explained that I think she has a remaining common bile duct stone.  GI has been called to see her.     Objective: Vital signs in last 24 hours: Temp:  [97.7 F (36.5 C)-98.7 F (37.1 C)] 97.7 F (36.5 C) (03/18 0453) Pulse Rate:  [55-99] 55 (03/18 0453) Resp:  [12-18] 14 (03/18 0453) BP: (118-155)/(60-95) 126/74 (03/18 0453) SpO2:  [94 %-100 %] 96 % (03/18 0453) Weight:  [133.8 kg] 133.8 kg (03/17 1328) Last BM Date: 07/23/18 To 40 p.o. 2000 IV 1700 urine Afebrile vital signs are stable LFTs are rising Total bilirubin 1.6>>2.2>>2.5 AST 116>> 294>>499 ALT 52>> 191>>449 WBC 10.0 Intake/Output from previous day: 03/17 0701 - 03/18 0700 In: 2529.8 [P.O.:240; I.V.:1989.8; IV Piggyback:300] Out: 1700 [Urine:1700] Intake/Output this shift: No intake/output data recorded.  General appearance: alert, cooperative and no distress Resp: clear to auscultation bilaterally GI: Soft, sore, port sites all look good.  Normal postoperative pain.  Lab Results:  Recent Labs    07/24/18 0554 07/25/18 0408  WBC 9.5 10.1  HGB 13.0 12.7  HCT 40.8 41.5  PLT 234 243    BMET Recent Labs    07/24/18 0554 07/25/18 0408  NA 139 138  K 4.1 4.1  CL 107 106  CO2 25 24  GLUCOSE 133* 141*  BUN 12 9  CREATININE 0.84 0.69  CALCIUM 9.1 9.2   PT/INR No results for input(s): LABPROT, INR in the last 72 hours.  Recent Labs  Lab 07/23/18 1850 07/24/18 0554 07/25/18 0408  AST 116* 294* 499*  ALT 52* 191* 449*  ALKPHOS 68 74 88  BILITOT 1.6* 2.2* 2.5*  PROT 8.4* 7.3 7.2  ALBUMIN 4.6 3.9 3.7     Lipase     Component Value Date/Time   LIPASE 38 07/23/2018 1850     Medications: . [START ON 07/30/2018] estradiol  0.05 mg Transdermal Weekly  . fluticasone  2 spray Each Nare  Daily  . gabapentin  1,200 mg Oral QHS  . levothyroxine  75 mcg Oral Q0600  . mupirocin ointment  1 application Nasal BID  . sertraline  100 mg Oral Daily   . 0.45 % NaCl with KCl 20 mEq / L 100 mL/hr at 07/25/18 0535  . cefTRIAXone (ROCEPHIN)  IV 2 g (07/25/18 0432)  . lactated ringers 50 mL/hr at 07/24/18 1335    Assessment/Plan Hx of ovarian cancer/ BSS/TAH/chemotherapy -2016 Hypothyroid Hx anxiety Lower extremity neuropathy Morbid obesity -BMI 44.8 Hx exercise-induced asthma  Cholelithiasis/cholecystitis/probable choledocholithiasis Elevated LFT's Laparoscopic cholecystectomy with IOC, 07/24/18, Dr. Armandina Gemma  FEN: IV fluids/n.p.o.@0400  ID: Rocephin 3/17 >> day 2 DVT: SCDs Follow-up: [facility]  Plan: We have asked GI to see and evaluate for ERCP.  Patient n.p.o. continue IV Rocephin until we are sure her stone is cleared.  Repeat LFTs tomorrow.    LOS: 0 days    Nain Rudd 07/25/2018 [phone]

## 2018-07-25 NOTE — Consult Note (Addendum)
 Referring Provider:  [facility]   Primary Care Physician:  John, James W, MD Primary Gastroenterologist: None     Reason for Consultation: Possible choledocholithiasis    Attending physician's note   I have taken an interval history, reviewed the chart and examined the patient. I agree with the Advanced Practitioner's note, impression and recommendations.  47yr old s/p lap chole with IOC yesterday.  IOC reviewed, no contrast flow in the duodenum, ?small filling defect distal CBD.  Pt with obstructive jaundice, LFTs highly s/o CBD stone.  No pancreatitis or ascending cholangitis.  No abdominal pain. Plan: -MRCP today -If positive, ERCP in AM.  Discussed risks and benefits of ERCP including pancreatitis, bleeding, perforation.  Benefits were also discussed. -Continue antibiotics -Trend liver function tests, lipase.  Raj Shaana Acocella, MD  ASSESSMENT / PLAN:    1. 47-year-old female with cholelithiasis, status post laparoscopic cholecystectomy with IOC yesterday.  No flow of contrast into duodenum at the level of the distal CBD.  Liver tests continue to rise concerning for choledocholithiasis.  -Will proceed with MRCP today. Will most likely need ERCP tomorrow.  -clear liquids following MRCP today.  -continue antibiotics -am liver tests  2. GERD, on daily PPI.    HPI:      HPI: [name] is a 47 y.o. 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  About a month ago patient had an episode of RUQ pain which subsided following a bowel movement.  There will days to weeks later she had another episode of RUQ pain which occurred following a bowel movement.  No abdominal pain in between these episodes or again until a couple of days ago.  He presented to the ED 07/23/2018 for evaluation of sharp RUQ pain with associated nausea.     ED evaluation : abdominal ultrasound showed multiple gallstones, no evidence for cholecystitis.  CBD 5 mm.   AST 116 ALT 152 Total bilirubin 1.6.   Normal alkaline phosphatase WBC 17.5   Patient was started on IV antibiotics, plans made for laparoscopic cholecystectomy.  Yesterday she had laparoscopic cholecystectomy with IOC which demonstrated a lack of contrast  flowing through the duodenum at the level of the distal CBD.   Overall patient feels okay today, a little sore from surgery.  Her white count has normalized but liver enzymes have risen significantly with AST now being 499, ALT 449.  Total bilirubin has continued to rise as well being 2.5 today.  Alk phos remains normal.  In general patient has no major GI issues.  She does give a history of GERD, takes Nexium on a daily basis.  She did recently stop the PPI trying to figure out what was causing her episodes of RUQ pain.  Other than GERD she endorses alternating constipation and diarrhea.  Diarrhea sometimes associated with urgency.  No blood in stool.  Patient has no family history of IBD.  No family history colon cancer.  She has never seen a gastroenterologist.  Past Medical History:  Diagnosis Date  . Anxiety   . Elevated blood pressure, situational 05/05/2016  . Endometriosis   . Exercise-induced asthma 09/08/2015  . Ovarian cancer, bilateral (HCC) 11/28/2014  . PONV (postoperative nausea and vomiting)   . Thyroid disease     Past Surgical History:  Procedure Laterality Date  . ABDOMINAL HYSTERECTOMY  11/10/14   at [facility], Exp lap, supracervical hyst,   BSO, infracolic omentectomy, lymphadenectomy, aortic lymph node sampling  . IR GENERIC HISTORICAL  05/12/2016   IR REMOVAL TUN ACCESS W/ PORT W/O FL MOD SED 05/12/2016 [facility]INTER[facility] RAD    Prior to Admission medications   Medication Sig Start Date End Date Taking? Authorizing Provider  acetaminophen (TYLENOL) 650 MG CR tablet Take 1,300 mg by mouth daily as needed for pain.   Yes  [provider]  ALPRAZolam (XANAX) 1 MG tablet Take 1 tablet (1 mg total) by mouth 2 (two) times daily as needed. for anxiety Patient taking differently: Take 1 mg by mouth daily as needed for anxiety. for anxiety 04/17/18  Yes John, James W, MD  estradiol (VIVELLE-DOT) 0.05 MG/24HR patch Place 1 patch (0.05 mg total) onto the skin 2 (two) times a week. 10/09/17  Yes Rossi, Emma, MD  fluticasone (FLONASE) 50 MCG/ACT nasal spray Place 2 sprays into both nostrils daily. 07/05/17  Yes Murray, Laura Woodruff, FNP  gabapentin (NEURONTIN) 600 MG tablet Take 2 tablets (1,200 mg total) by mouth 3 (three) times daily. Patient taking differently: Take 1,200 mg by mouth at bedtime.  03/09/18  Yes Patel, Donika K, DO  hydroxypropyl methylcellulose / hypromellose (ISOPTO TEARS / GONIOVISC) 2.5 % ophthalmic solution Place 1 drop into both eyes daily as needed for dry eyes.   Yes [provider]  levothyroxine (SYNTHROID, LEVOTHROID) 75 MCG tablet Take 1 tablet (75 mcg total) by mouth daily. 10/18/17  Yes John, James W, MD  Melatonin 10 MG TABS Take 10 mg by mouth at bedtime.   Yes [provider]  sertraline (ZOLOFT) 100 MG tablet TAKE 1 TABLET BY MOUTH EVERY DAY 02/12/18  Yes John, James W, MD  sodium chloride (OCEAN) 0.65 % SOLN nasal spray Place 1 spray into both nostrils daily as needed for congestion.   Yes [provider]  VALERIAN PO Take 1 tablet by mouth daily.   Yes [provider]  zolpidem (AMBIEN) 10 MG tablet Take 1 tablet (10 mg total) by mouth at bedtime as needed for sleep. 10/16/17 07/23/18 Yes John, James W, MD  albuterol (PROVENTIL HFA;VENTOLIN HFA) 108 (90 Base) MCG/ACT inhaler TAKE 2 PUFFS BY MOUTH EVERY 6 HOURS AS NEEDED FOR WHEEZE OR SHORTNESS OF BREATH Patient not taking: Reported on 07/23/2018 08/28/17   John, James W, MD  amoxicillin-clavulanate (AUGMENTIN) 875-125 MG tablet Take 1 tablet by mouth 2 (two) times daily. Patient not taking: Reported on  07/23/2018 02/20/18   Murray, Laura Woodruff, FNP  amphetamine-dextroamphetamine (ADDERALL) 5 MG tablet Take 1 tablet (5 mg total) by mouth 2 (two) times daily with a meal. Patient not taking: Reported on 07/23/2018 01/23/18   Cross, Melissa D, NP  clobetasol cream (TEMOVATE) 0.05 % Apply 1 application topically 2 (two) times daily. Patient not taking: Reported on 07/23/2018 10/16/17   John, James W, MD  lisinopril (PRINIVIL,ZESTRIL) 5 MG tablet TAKE 1 TABLET BY MOUTH ONCE DAILY Patient not taking: Reported on 07/23/2018 05/18/17   John, James W, MD  meloxicam (MOBIC) 15 MG tablet TAKE 1 TABLET BY MOUTH ONCE DAILY AS NEEDED FOR PAIN Patient not taking: Reported on 07/23/2018 12/11/17   John, James W, MD  montelukast (SINGULAIR) 10 MG tablet Take 1 tablet (10 mg total) by mouth at bedtime. Patient not taking: Reported on 07/23/2018 11/01/16   John, James W, MD  pantoprazole (PROTONIX) 40 MG tablet TAKE 1 TABLET BY MOUTH TWICE A DAY FOR 2 WEEKS, THEN TAKE 1 TABLET ONCE DAILY Patient not taking: Reported   on 07/23/2018 07/17/17   Biagio Borg, MD    Current Facility-Administered Medications  Medication Dose Route Frequency Provider Last Rate Last Dose  . 0.45 % NaCl with KCl 20 mEq / L infusion   Intravenous Continuous Earnstine Regal, PA-C 100 mL/hr at 07/25/18 0535    . ALPRAZolam Duanne Moron) tablet 1 mg  1 mg Oral BID PRN Earnstine Regal, PA-C      . cefTRIAXone (ROCEPHIN) 2 g in sodium chloride 0.9 % 100 mL IVPB  2 g Intravenous Q24H Earnstine Regal, PA-C 200 mL/hr at 07/25/18 0432 2 g at 07/25/18 0432  . [START ON 07/30/2018] estradiol (CLIMARA - Dosed in mg/24 hr) patch 0.05 mg  0.05 mg Transdermal Weekly Earnstine Regal, PA-C      . fluticasone [facility]) 50 MCG/ACT nasal spray 2 spray  2 spray Each Nare Daily Earnstine Regal, PA-C      . gabapentin (NEURONTIN) capsule 1,200 mg  1,200 mg Oral QHS Earnstine Regal, PA-C   1,200 mg at 07/24/18 2127  . HYDROcodone-acetaminophen (NORCO/VICODIN) 5-325  MG per tablet 1-2 tablet  1-2 tablet Oral Q4H PRN Earnstine Regal, PA-C   1 tablet at 07/25/18 0432  . HYDROmorphone (DILAUDID) injection 1 mg  1 mg Intravenous Q2H PRN Earnstine Regal, PA-C   1 mg at 07/24/18 2039  . ketorolac (TORADOL) 30 MG/ML injection 30 mg  30 mg Intravenous Q8H PRN Earnstine Regal, PA-C      . lactated ringers infusion   Intravenous Continuous Earnstine Regal, PA-C 50 mL/hr at 07/24/18 1335    . levothyroxine (SYNTHROID, LEVOTHROID) tablet 75 mcg  75 mcg Oral Q0600 Earnstine Regal, PA-C   75 mcg at 07/25/18 0535  . LORazepam (ATIVAN) injection 0.5 mg  0.5 mg Intravenous Q6H PRN Earnstine Regal, PA-C   0.5 mg at 07/24/18 1229  . morphine 2 MG/ML injection 1-3 mg  1-3 mg Intravenous Q2H PRN Earnstine Regal, PA-C   2 mg at 07/24/18 0810  . mupirocin ointment (BACTROBAN) 2 % 1 application  1 application Nasal BID Earnstine Regal, PA-C   1 application at 16/10/96 2127  . ondansetron (ZOFRAN-ODT) disintegrating tablet 4 mg  4 mg Oral Q6H PRN Earnstine Regal, PA-C       Or  . ondansetron [facility]) injection 4 mg  4 mg Intravenous Q6H PRN Earnstine Regal, PA-C   4 mg at 07/24/18 0810  . polyvinyl alcohol (LIQUIFILM TEARS) 1.4 % ophthalmic solution 1 drop  1 drop Both Eyes Daily PRN Earnstine Regal, PA-C      . sertraline (ZOLOFT) tablet 100 mg  100 mg Oral Daily Earnstine Regal, PA-C   100 mg at 07/24/18 0920  . traMADol (ULTRAM) tablet 50 mg  50 mg Oral Q6H PRN Earnstine Regal, PA-C   50 mg at 07/25/18 0825  . zolpidem (AMBIEN) tablet 5 mg  5 mg Oral QHS PRN Earnstine Regal, PA-C        Allergies as of 07/23/2018 - Review Complete 07/23/2018  Allergen Reaction Noted  . Moxifloxacin Anaphylaxis   . Quinolones Anaphylaxis, Shortness Of Breath, and Swelling 11/21/2014  . Doxycycline Nausea And Vomiting 12/27/2007  . Escitalopram oxalate Other (See Comments) 02/24/2010  . Sulfonamide derivatives Swelling and Rash 04/13/2007    Family History  Problem  Relation Age of Onset  . Hypertension Mother   . Diabetes Mother   . Stroke Mother        Deceased  . Atrial fibrillation Father        Living  .  Diabetes Maternal Aunt   . Heart failure Maternal Grandmother   . Heart attack Maternal Grandfather   . Dementia Paternal Grandmother   . Dementia Paternal Grandfather   . Healthy Brother     Social History   Socioeconomic History  . Marital status: Divorced    Spouse name: Not on file  . Number of children: 0  . Years of education: Not on file  . Highest education level: Not on file  Occupational History  . Not on file  Social Needs  . Financial resource strain: Not on file  . Food insecurity:    Worry: Not on file    Inability: Not on file  . Transportation needs:    Medical: Not on file    Non-medical: Not on file  Tobacco Use  . Smoking status: Never Smoker  . Smokeless tobacco: Never Used  Substance and Sexual Activity  . Alcohol use: Yes    Alcohol/week: 0.0 standard drinks    Comment: Rarely  . Drug use: No  . Sexual activity: Not on file  Lifestyle  . Physical activity:    Days per week: Not on file    Minutes per session: Not on file  . Stress: Not on file  Relationships  . Social connections:    Talks on phone: Not on file    Gets together: Not on file    Attends religious service: Not on file    Active member of club or organization: Not on file    Attends meetings of clubs or organizations: Not on file    Relationship status: Not on file  . Intimate partner violence:    Fear of current or ex partner: Not on file    Emotionally abused: Not on file    Physically abused: Not on file    Forced sexual activity: Not on file  Other Topics Concern  . Not on file  Social History Narrative   Lives with boyfriend in a one story home.  Has no children.     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     Education: college.    Review of Systems: All systems reviewed and negative except where noted in HPI.  Physical Exam:  Vital signs in last 24 hours: Temp:  [97.7 F (36.5 C)-98.7 F (37.1 C)] 97.7 F (36.5 C) (03/18 0453) Pulse Rate:  [55-99] 55 (03/18 0453) Resp:  [12-18] 14 (03/18 0453) BP: (118-155)/(60-95) 126/74 (03/18 0453) SpO2:  [94 %-100 %] 96 % (03/18 0453) Weight:  [133.8 kg] 133.8 kg (03/17 1328) Last BM Date: 07/23/18 General:   Alert, white female in NAD Psych:  Pleasant, cooperative. Normal mood and affect. Eyes:  Pupils equal, sclera clear, no icterus.   Conjunctiva pink. Ears:  Normal auditory acuity. Nose:  No deformity, discharge,  or lesions. Neck:  Supple; no masses Lungs:  Clear throughout to auscultation.   No wheezes, crackles, or rhonchi.  Heart:  Regular rate and rhythm; no murmurs, no lower extremity edema Abdomen:  Soft, non-distended, nontender, BS active, Rectal:  Deferred  Msk:  Symmetrical without gross deformities. . Neurologic:  Alert and  oriented x4;  grossly normal neurologically. Skin:  Intact without significant lesions or rashes.   Intake/Output from previous day: 03/17 0701 - 03/18 0700 In: 2529.8 [P.O.:240; I.V.:1989.8; IV Piggyback:300] Out: 1700 [Urine:1700] Intake/Output this shift: No intake/output data recorded.  Lab Results: Recent Labs    07/23/18 1850 07/24/18 0554 07/25/18 0408  WBC 17.5* 9.5 10.1  HGB 14.0 13.0  12.7  HCT 43.6 40.8 41.5  PLT 282 234 243   BMET Recent Labs    07/23/18 1850 07/24/18 0554 07/25/18 0408  NA 138 139 138  K 4.0 4.1 4.1  CL 104 107 106  CO2 _0 GLUCOSE 152* 133* 141*  BUN _1 CREATININE 0.83 0.84 0.69  CALCIUM 9.5 9.1 9.2   LFT Recent Labs    07/25/18 0408  PROT 7.2  ALBUMIN 3.7  AST 499*  ALT 449*  ALKPHOS 88  BILITOT 2.5*     Studies/Results: Dg Cholangiogram Operative  Result Date: 07/24/2018 CLINICAL DATA:  Cholecystectomy for symptomatic cholelithiasis. EXAM: INTRAOPERATIVE CHOLANGIOGRAM TECHNIQUE: Cholangiographic images from the C-arm fluoroscopic device were  submitted for interpretation post-operatively. Please see the procedural report for the amount of contrast and the fluoroscopy time utilized. COMPARISON:  Abdominal ultrasound on 07/23/2018 FINDINGS: Imaging obtained with a C-arm intraoperatively shows normal caliber bile ducts. Contrast can be followed into the distal common bile duct but is not seen to enter the duodenum. Although no discrete filling defect is identified, this may be consistent with a distal CBD calculus. IMPRESSION: Lack of visualization of contrast flow into the duodenum at the level of the distal CBD. A distal CBD calculus cannot be excluded. Electronically Signed   By: Aletta Edouard M.D.   On: 07/24/2018 16:15   US Abdomen Limited Ruq  Result Date: 07/23/2018 CLINICAL DATA:  Right upper quadrant pain EXAM: ULTRASOUND ABDOMEN LIMITED RIGHT UPPER QUADRANT COMPARISON:  10/11/2017 FINDINGS: Gallbladder: Numerous layering gallstones within the gallbladder. No wall thickening or sonographic Murphy's sign. Small anterior gallbladder wall polyp. Common bile duct: Diameter: Normal caliber, 5 mm Liver: No focal lesion identified. Within normal limits in parenchymal echogenicity. Portal vein is patent on color Doppler imaging with normal direction of blood flow towards the liver. IMPRESSION: Cholelithiasis.  No sonographic evidence of acute cholecystitis. No acute findings. Electronically Signed   By: Rolm Baptise M.D.   On: 07/23/2018 22:10    Tye Savoy, NP-C @  07/25/2018, 9:10 AM

## 2018-07-25 NOTE — H&P (View-Only) (Signed)
Referring Provider:  [facility]   Primary Care Physician:  Biagio Borg, MD Primary Gastroenterologist: None     Reason for Consultation: Possible choledocholithiasis    Attending physician's note   I have taken an interval history, reviewed the chart and examined the patient. I agree with the Advanced Practitioner's note, impression and recommendations.  48yrold s/p lap chole with IOC yesterday.  IOC reviewed, no contrast flow in the duodenum, ?small filling defect distal CBD.  Pt with obstructive jaundice, LFTs highly s/o CBD stone.  No pancreatitis or ascending cholangitis.  No abdominal pain. Plan: -MRCP today -If positive, ERCP in AM.  Discussed risks and benefits of ERCP including pancreatitis, bleeding, perforation.  Benefits were also discussed. -Continue antibiotics -Trend liver function tests, lipase.  RCarmell Austria MD  ASSESSMENT / PLAN:    [age]year old female with cholelithiasis, status post laparoscopic cholecystectomy with IOC yesterday.  No flow of contrast into duodenum at the level of the distal CBD.  Liver tests continue to rise concerning for choledocholithiasis.  -Will proceed with MRCP today. Will most likely need ERCP tomorrow.  -clear liquids following MRCP today.  -continue antibiotics -am liver tests  2. GERD, on daily PPI.    HPI:      HPI: Sara Brundidgeis Sara 48y.o. 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  About Sara month ago patient had an episode of RUQ pain which subsided following Sara bowel movement.  There will days to weeks later she had another episode of RUQ pain which occurred following Sara bowel movement.  No abdominal pain in between these episodes or again until Sara couple of days ago.  He presented to the ED 07/23/2018 for evaluation of sharp RUQ pain with associated nausea.     ED evaluation : abdominal ultrasound showed multiple gallstones, no evidence for cholecystitis.  CBD 5 mm.   AST 116 ALT 152 Total bilirubin 1.6.   Normal alkaline phosphatase WBC 17.5   Patient was started on IV antibiotics, plans made for laparoscopic cholecystectomy.  Yesterday she had laparoscopic cholecystectomy with IOC which demonstrated Sara lack of contrast  flowing through the duodenum at the level of the distal CBD.   Overall patient feels okay today, Sara little sore from surgery.  Her white count has normalized but liver enzymes have risen significantly with AST now being 499, ALT 449.  Total bilirubin has continued to rise as well being 2.5 today.  Alk phos remains normal.  In general patient has no major GI issues.  She does give Sara history of GERD, takes Nexium on Sara daily basis.  She did recently stop the PPI trying to figure out what was causing her episodes of RUQ pain.  Other than GERD she endorses alternating constipation and diarrhea.  Diarrhea sometimes associated with urgency.  No blood in stool.  Patient has no family history of IBD.  No family history colon cancer.  She has never seen Sara gastroenterologist.  Past Medical History:  Diagnosis Date  . Anxiety   . Elevated blood pressure, situational 05/05/2016  . Endometriosis   . Exercise-induced asthma 09/08/2015  . Ovarian cancer, bilateral (HBrice 11/28/2014  . PONV (postoperative nausea and vomiting)   . Thyroid disease     Past Surgical History:  Procedure Laterality Date  . ABDOMINAL HYSTERECTOMY  11/10/14   [facility] Exp lap, supracervical hyst,  BSO, infracolic omentectomy, lymphadenectomy, aortic lymph node sampling  . IR GENERIC HISTORICAL  05/12/2016   IR REMOVAL TUN ACCESS W/ PORT W/O FL MOD SED 05/12/2016 [facility]INTER[facility] RAD    Prior to Admission medications   Medication Sig Start Date End Date Taking? Authorizing Provider  acetaminophen (TYLENOL) 650 MG CR tablet Take 1,300 mg by mouth daily as needed for pain.   Yes  [provider]  ALPRAZolam Duanne Moron) 1 MG tablet Take 1 tablet (1 mg total) by mouth 2 (two) times daily as needed. for anxiety Patient taking differently: Take 1 mg by mouth daily as needed for anxiety. for anxiety 04/17/18  Yes Biagio Borg, MD  estradiol (VIVELLE-DOT) 0.05 MG/24HR patch Place 1 patch (0.05 mg total) onto the skin 2 (two) times Sara week. 10/09/17  Yes Everitt Amber, MD  fluticasone [facility]) 50 MCG/ACT nasal spray Place 2 sprays into both nostrils daily. 07/05/17  Yes Marrian Salvage, FNP  gabapentin (NEURONTIN) 600 MG tablet Take 2 tablets (1,200 mg total) by mouth 3 (three) times daily. Patient taking differently: Take 1,200 mg by mouth at bedtime.  03/09/18  Yes Patel, Donika K, DO  hydroxypropyl methylcellulose / hypromellose (ISOPTO TEARS / GONIOVISC) 2.5 % ophthalmic solution Place 1 drop into both eyes daily as needed for dry eyes.   Yes [provider]  levothyroxine (SYNTHROID, LEVOTHROID) 75 MCG tablet Take 1 tablet (75 mcg total) by mouth daily. 10/18/17  Yes Biagio Borg, MD  Melatonin 10 MG TABS Take 10 mg by mouth at bedtime.   Yes [provider]  sertraline (ZOLOFT) 100 MG tablet TAKE 1 TABLET BY MOUTH EVERY DAY 02/12/18  Yes Biagio Borg, MD  sodium chloride (OCEAN) 0.65 % SOLN nasal spray Place 1 spray into both nostrils daily as needed for congestion.   Yes [provider]  VALERIAN PO Take 1 tablet by mouth daily.   Yes [provider]  zolpidem (AMBIEN) 10 MG tablet Take 1 tablet (10 mg total) by mouth at bedtime as needed for sleep. 10/16/17 07/23/18 Yes Biagio Borg, MD  albuterol (PROVENTIL HFA;VENTOLIN HFA) 108 (90 Base) MCG/ACT inhaler TAKE 2 PUFFS BY MOUTH EVERY 6 HOURS AS NEEDED FOR WHEEZE OR SHORTNESS OF BREATH Patient not taking: Reported on 07/23/2018 08/28/17   Biagio Borg, MD  amoxicillin-clavulanate (AUGMENTIN) 875-125 MG tablet Take 1 tablet by mouth 2 (two) times daily. Patient not taking: Reported on  07/23/2018 02/20/18   Marrian Salvage, FNP  amphetamine-dextroamphetamine (ADDERALL) 5 MG tablet Take 1 tablet (5 mg total) by mouth 2 (two) times daily with Sara meal. Patient not taking: Reported on 07/23/2018 01/23/18   Joylene John D, NP  clobetasol cream (TEMOVATE) 9.02 % Apply 1 application topically 2 (two) times daily. Patient not taking: Reported on 07/23/2018 10/16/17   Biagio Borg, MD  lisinopril (PRINIVIL,ZESTRIL) 5 MG tablet TAKE 1 TABLET BY MOUTH ONCE DAILY Patient not taking: Reported on 07/23/2018 05/18/17   Biagio Borg, MD  meloxicam ([place]) 15 MG tablet TAKE 1 TABLET BY MOUTH ONCE DAILY AS NEEDED FOR PAIN Patient not taking: Reported on 07/23/2018 12/11/17   Biagio Borg, MD  montelukast (SINGULAIR) 10 MG tablet Take 1 tablet (10 mg total) by mouth at bedtime. Patient not taking: Reported on 07/23/2018 11/01/16   Biagio Borg, MD  pantoprazole (PROTONIX) 40 MG tablet TAKE 1 TABLET BY MOUTH TWICE Sara DAY FOR 2 WEEKS, THEN TAKE 1 TABLET ONCE DAILY Patient not taking: Reported  on 07/23/2018 07/17/17   Biagio Borg, MD    Current Facility-Administered Medications  Medication Dose Route Frequency Provider Last Rate Last Dose  . 0.45 % NaCl with KCl 20 mEq / L infusion   Intravenous Continuous Earnstine Regal, PA-C 100 mL/hr at 07/25/18 0535    . ALPRAZolam Duanne Moron) tablet 1 mg  1 mg Oral BID PRN Earnstine Regal, PA-C      . cefTRIAXone (ROCEPHIN) 2 g in sodium chloride 0.9 % 100 mL IVPB  2 g Intravenous Q24H Earnstine Regal, PA-C 200 mL/hr at 07/25/18 0432 2 g at 07/25/18 0432  . [START ON 07/30/2018] estradiol (CLIMARA - Dosed in mg/24 hr) patch 0.05 mg  0.05 mg Transdermal Weekly Earnstine Regal, PA-C      . fluticasone [facility]) 50 MCG/ACT nasal spray 2 spray  2 spray Each Nare Daily Earnstine Regal, PA-C      . gabapentin (NEURONTIN) capsule 1,200 mg  1,200 mg Oral QHS Earnstine Regal, PA-C   1,200 mg at 07/24/18 2127  . HYDROcodone-acetaminophen (NORCO/VICODIN) 5-325  MG per tablet 1-2 tablet  1-2 tablet Oral Q4H PRN Earnstine Regal, PA-C   1 tablet at 07/25/18 0432  . HYDROmorphone (DILAUDID) injection 1 mg  1 mg Intravenous Q2H PRN Earnstine Regal, PA-C   1 mg at 07/24/18 2039  . ketorolac (TORADOL) 30 MG/ML injection 30 mg  30 mg Intravenous Q8H PRN Earnstine Regal, PA-C      . lactated ringers infusion   Intravenous Continuous Earnstine Regal, PA-C 50 mL/hr at 07/24/18 1335    . levothyroxine (SYNTHROID, LEVOTHROID) tablet 75 mcg  75 mcg Oral Q0600 Earnstine Regal, PA-C   75 mcg at 07/25/18 0535  . LORazepam (ATIVAN) injection 0.5 mg  0.5 mg Intravenous Q6H PRN Earnstine Regal, PA-C   0.5 mg at 07/24/18 1229  . morphine 2 MG/ML injection 1-3 mg  1-3 mg Intravenous Q2H PRN Earnstine Regal, PA-C   2 mg at 07/24/18 0810  . mupirocin ointment (BACTROBAN) 2 % 1 application  1 application Nasal BID Earnstine Regal, PA-C   1 application at 16/10/96 2127  . ondansetron (ZOFRAN-ODT) disintegrating tablet 4 mg  4 mg Oral Q6H PRN Earnstine Regal, PA-C       Or  . ondansetron [facility]) injection 4 mg  4 mg Intravenous Q6H PRN Earnstine Regal, PA-C   4 mg at 07/24/18 0810  . polyvinyl alcohol (LIQUIFILM TEARS) 1.4 % ophthalmic solution 1 drop  1 drop Both Eyes Daily PRN Earnstine Regal, PA-C      . sertraline (ZOLOFT) tablet 100 mg  100 mg Oral Daily Earnstine Regal, PA-C   100 mg at 07/24/18 0920  . traMADol (ULTRAM) tablet 50 mg  50 mg Oral Q6H PRN Earnstine Regal, PA-C   50 mg at 07/25/18 0825  . zolpidem (AMBIEN) tablet 5 mg  5 mg Oral QHS PRN Earnstine Regal, PA-C        Allergies as of 07/23/2018 - Review Complete 07/23/2018  Allergen Reaction Noted  . Moxifloxacin Anaphylaxis   . Quinolones Anaphylaxis, Shortness Of Breath, and Swelling 11/21/2014  . Doxycycline Nausea And Vomiting 12/27/2007  . Escitalopram oxalate Other (See Comments) 02/24/2010  . Sulfonamide derivatives Swelling and Rash 04/13/2007    Family History  Problem  Relation Age of Onset  . Hypertension Mother   . Diabetes Mother   . Stroke Mother        Deceased  . Atrial fibrillation Father        Living  .  Diabetes Maternal Aunt   . Heart failure Maternal Grandmother   . Heart attack Maternal Grandfather   . Dementia Paternal Grandmother   . Dementia Paternal Grandfather   . Healthy Brother     Social History   Socioeconomic History  . Marital status: Divorced    Spouse name: Not on file  . Number of children: 0  . Years of education: Not on file  . Highest education level: Not on file  Occupational History  . Not on file  Social Needs  . Financial resource strain: Not on file  . Food insecurity:    Worry: Not on file    Inability: Not on file  . Transportation needs:    Medical: Not on file    Non-medical: Not on file  Tobacco Use  . Smoking status: Never Smoker  . Smokeless tobacco: Never Used  Substance and Sexual Activity  . Alcohol use: Yes    Alcohol/week: 0.0 standard drinks    Comment: Rarely  . Drug use: No  . Sexual activity: Not on file  Lifestyle  . Physical activity:    Days per week: Not on file    Minutes per session: Not on file  . Stress: Not on file  Relationships  . Social connections:    Talks on phone: Not on file    Gets together: Not on file    Attends religious service: Not on file    Active member of club or organization: Not on file    Attends meetings of clubs or organizations: Not on file    Relationship status: Not on file  . Intimate partner violence:    Fear of current or ex partner: Not on file    Emotionally abused: Not on file    Physically abused: Not on file    Forced sexual activity: Not on file  Other Topics Concern  . Not on file  Social History Narrative   Lives with boyfriend in Sara one story home.  Has no children.     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     Education: college.    Review of Systems: All systems reviewed and negative except where noted in HPI.  Physical Exam:  Vital signs in last 24 hours: Temp:  [97.7 F (36.5 C)-98.7 F (37.1 C)] 97.7 F (36.5 C) (03/18 0453) Pulse Rate:  [55-99] 55 (03/18 0453) Resp:  [12-18] 14 (03/18 0453) BP: (118-155)/(60-95) 126/74 (03/18 0453) SpO2:  [94 %-100 %] 96 % (03/18 0453) Weight:  [133.8 kg] 133.8 kg (03/17 1328) Last BM Date: 07/23/18 General:   Alert, white female in NAD Psych:  Pleasant, cooperative. Normal mood and affect. Eyes:  Pupils equal, sclera clear, no icterus.   Conjunctiva pink. Ears:  Normal auditory acuity. Nose:  No deformity, discharge,  or lesions. Neck:  Supple; no masses Lungs:  Clear throughout to auscultation.   No wheezes, crackles, or rhonchi.  Heart:  Regular rate and rhythm; no murmurs, no lower extremity edema Abdomen:  Soft, non-distended, nontender, BS active, Rectal:  Deferred  Msk:  Symmetrical without gross deformities. . Neurologic:  Alert and  oriented x4;  grossly normal neurologically. Skin:  Intact without significant lesions or rashes.   Intake/Output from previous day: 03/17 0701 - 03/18 0700 In: 2529.8 [P.O.:240; I.V.:1989.8; IV Piggyback:300] Out: 1700 [Urine:1700] Intake/Output this shift: No intake/output data recorded.  Lab Results: Recent Labs    07/23/18 1850 07/24/18 0554 07/25/18 0408  WBC 17.5* 9.5 10.1  HGB 14.0 13.0  12.7  HCT 43.6 40.8 41.5  PLT 282 234 243   BMET Recent Labs    07/23/18 1850 07/24/18 0554 07/25/18 0408  NA 138 139 138  K 4.0 4.1 4.1  CL 104 107 106  CO2 _0 GLUCOSE 152* 133* 141*  BUN _1 CREATININE 0.83 0.84 0.69  CALCIUM 9.5 9.1 9.2   LFT Recent Labs    07/25/18 0408  PROT 7.2  ALBUMIN 3.7  AST 499*  ALT 449*  ALKPHOS 88  BILITOT 2.5*     Studies/Results: Dg Cholangiogram Operative  Result Date: 07/24/2018 CLINICAL DATA:  Cholecystectomy for symptomatic cholelithiasis. EXAM: INTRAOPERATIVE CHOLANGIOGRAM TECHNIQUE: Cholangiographic images from the C-arm fluoroscopic device were  submitted for interpretation post-operatively. Please see the procedural report for the amount of contrast and the fluoroscopy time utilized. COMPARISON:  Abdominal ultrasound on 07/23/2018 FINDINGS: Imaging obtained with Sara C-arm intraoperatively shows normal caliber bile ducts. Contrast can be followed into the distal common bile duct but is not seen to enter the duodenum. Although no discrete filling defect is identified, this may be consistent with Sara distal CBD calculus. IMPRESSION: Lack of visualization of contrast flow into the duodenum at the level of the distal CBD. Sara distal CBD calculus cannot be excluded. Electronically Signed   By: Aletta Edouard M.D.   On: 07/24/2018 16:15   US Abdomen Limited Ruq  Result Date: 07/23/2018 CLINICAL DATA:  Right upper quadrant pain EXAM: ULTRASOUND ABDOMEN LIMITED RIGHT UPPER QUADRANT COMPARISON:  10/11/2017 FINDINGS: Gallbladder: Numerous layering gallstones within the gallbladder. No wall thickening or sonographic Murphy's sign. Small anterior gallbladder wall polyp. Common bile duct: Diameter: Normal caliber, 5 mm Liver: No focal lesion identified. Within normal limits in parenchymal echogenicity. Portal vein is patent on color Doppler imaging with normal direction of blood flow towards the liver. IMPRESSION: Cholelithiasis.  No sonographic evidence of acute cholecystitis. No acute findings. Electronically Signed   By: Rolm Baptise M.D.   On: 07/23/2018 22:10    Tye Savoy, NP-C @  07/25/2018, 9:10 AM

## 2018-07-26 ENCOUNTER — Inpatient Hospital Stay (HOSPITAL_COMMUNITY): Payer: Self-pay | Admitting: Certified Registered"

## 2018-07-26 ENCOUNTER — Inpatient Hospital Stay (HOSPITAL_COMMUNITY): Payer: Self-pay

## 2018-07-26 ENCOUNTER — Encounter (HOSPITAL_COMMUNITY): Payer: Self-pay | Admitting: *Deleted

## 2018-07-26 ENCOUNTER — Encounter (HOSPITAL_COMMUNITY): Admission: EM | Disposition: A | Discharge status: Home / Self Care | Payer: Self-pay | Source: Home / Self Care

## 2018-07-26 DIAGNOSIS — K8050 Calculus of bile duct without cholangitis or cholecystitis without obstruction: Secondary | ICD-10-CM

## 2018-07-26 DIAGNOSIS — R1011 Right upper quadrant pain: Secondary | ICD-10-CM

## 2018-07-26 DIAGNOSIS — R932 Abnormal findings on diagnostic imaging of liver and biliary tract: Secondary | ICD-10-CM

## 2018-07-26 HISTORY — PX: ERCP: SHX5425

## 2018-07-26 HISTORY — PX: REMOVAL OF STONES: SHX5545

## 2018-07-26 HISTORY — PX: SPHINCTEROTOMY: SHX5544

## 2018-07-26 LAB — COMPREHENSIVE METABOLIC PANEL
ALT: 561 U/L — ABNORMAL HIGH (ref 0–44)
AST: 397 U/L — ABNORMAL HIGH (ref 15–41)
Albumin: 3.6 g/dL (ref 3.5–5.0)
Alkaline Phosphatase: 98 U/L (ref 38–126)
Anion gap: 7 (ref 5–15)
BUN: 10 mg/dL (ref 6–20)
CO2: 28 mmol/L (ref 22–32)
Calcium: 8.8 mg/dL — ABNORMAL LOW (ref 8.9–10.3)
Chloride: 104 mmol/L (ref 98–111)
Creatinine, Ser: 0.83 mg/dL (ref 0.44–1.00)
GFR calc non Af Amer: >60 mL/min (ref >60)
Glucose, Bld: 109 mg/dL — ABNORMAL HIGH (ref 70–99)
POTASSIUM: 4.3 mmol/L (ref 3.5–5.1)
Sodium: 139 mmol/L (ref 135–145)
Total Bilirubin: 2.2 mg/dL — ABNORMAL HIGH (ref 0.3–1.2)
Total Protein: 7.1 g/dL (ref 6.5–8.1)

## 2018-07-26 LAB — LIPASE, BLOOD: Lipase: 28 U/L (ref 11–51)

## 2018-07-26 LAB — CBC
HCT: 37.9 % (ref 36.0–46.0)
Hemoglobin: 12 g/dL (ref 12.0–15.0)
MCH: 30.3 pg (ref 26.0–34.0)
MCHC: 31.7 g/dL (ref 30.0–36.0)
MCV: 95.7 fL (ref 80.0–100.0)
Platelets: 180 10*3/uL (ref 150–400)
RBC: 3.96 MIL/uL (ref 3.87–5.11)
RDW: 13.2 % (ref 11.5–15.5)
WBC: 6.1 10*3/uL (ref 4.0–10.5)
nRBC: 0 % (ref 0.0–0.2)

## 2018-07-26 SURGERY — ERCP, WITH INTERVENTION IF INDICATED
Anesthesia: General

## 2018-07-26 MED ORDER — DEXAMETHASONE SODIUM PHOSPHATE 10 MG/ML IJ SOLN
INTRAMUSCULAR | Status: DC | PRN
  Administered 2018-07-26: 10 mg via INTRAVENOUS

## 2018-07-26 MED ORDER — IBUPROFEN 200 MG PO TABS: 600.0000 mg | ORAL_TABLET | Freq: Four times a day (QID) | ORAL | Status: DC | PRN

## 2018-07-26 MED ORDER — HYDRALAZINE HCL 20 MG/ML IJ SOLN
INTRAMUSCULAR | Status: AC
  Filled 2018-07-26: qty 1

## 2018-07-26 MED ORDER — PROMETHAZINE HCL 25 MG/ML IJ SOLN
6.2500 mg | Freq: Once | INTRAMUSCULAR | Status: AC
  Administered 2018-07-26: 6.25 mg via INTRAVENOUS

## 2018-07-26 MED ORDER — FENTANYL CITRATE (PF) 250 MCG/5ML IJ SOLN
INTRAMUSCULAR | Status: AC
  Filled 2018-07-26: qty 5

## 2018-07-26 MED ORDER — PROMETHAZINE HCL 25 MG/ML IJ SOLN
INTRAMUSCULAR | Status: AC
  Filled 2018-07-26: qty 1

## 2018-07-26 MED ORDER — MIDAZOLAM HCL 2 MG/2ML IJ SOLN
INTRAMUSCULAR | Status: DC | PRN
  Administered 2018-07-26 (×2): 1 mg via INTRAVENOUS

## 2018-07-26 MED ORDER — LIDOCAINE 2% (20 MG/ML) 5 ML SYRINGE
INTRAMUSCULAR | Status: DC | PRN
  Administered 2018-07-26: 100 mg via INTRAVENOUS

## 2018-07-26 MED ORDER — SUCCINYLCHOLINE CHLORIDE 200 MG/10ML IV SOSY
PREFILLED_SYRINGE | INTRAVENOUS | Status: DC | PRN
  Administered 2018-07-26: 140 mg via INTRAVENOUS

## 2018-07-26 MED ORDER — PROMETHAZINE HCL 25 MG/ML IJ SOLN: 6.2500 mg | INTRAMUSCULAR | Status: DC | PRN

## 2018-07-26 MED ORDER — PROPOFOL 10 MG/ML IV BOLUS
INTRAVENOUS | Status: AC
  Filled 2018-07-26: qty 20

## 2018-07-26 MED ORDER — INDOMETHACIN 50 MG RE SUPP: 100.0000 mg | Freq: Once | RECTAL | Status: DC

## 2018-07-26 MED ORDER — ONDANSETRON HCL 4 MG/2ML IJ SOLN
INTRAMUSCULAR | Status: DC | PRN
  Administered 2018-07-26: 4 mg via INTRAVENOUS

## 2018-07-26 MED ORDER — OXYCODONE HCL 5 MG PO TABS
5.0000 mg | ORAL_TABLET | ORAL | Status: DC | PRN
  Administered 2018-07-26: 5 mg via ORAL
  Administered 2018-07-26: 10 mg via ORAL
  Administered 2018-07-27 – 2018-07-28 (×2): 5 mg via ORAL
  Administered 2018-07-28: 10 mg via ORAL
  Administered 2018-07-28: 5 mg via ORAL
  Administered 2018-07-28 – 2018-07-29 (×2): 10 mg via ORAL
  Filled 2018-07-26 (×2): qty 1
  Filled 2018-07-26: qty 2
  Filled 2018-07-26: qty 1
  Filled 2018-07-26 (×2): qty 2
  Filled 2018-07-26: qty 1
  Filled 2018-07-26 (×2): qty 2

## 2018-07-26 MED ORDER — DIPHENHYDRAMINE HCL 50 MG/ML IJ SOLN
INTRAMUSCULAR | Status: DC | PRN
  Administered 2018-07-26: 12.5 mg via INTRAVENOUS

## 2018-07-26 MED ORDER — MIDAZOLAM HCL 2 MG/2ML IJ SOLN
INTRAMUSCULAR | Status: AC
  Filled 2018-07-26: qty 2

## 2018-07-26 MED ORDER — SODIUM CHLORIDE 0.9 % IV SOLN
INTRAVENOUS | Status: DC | PRN
  Administered 2018-07-26: 50 mL

## 2018-07-26 MED ORDER — HYDROMORPHONE HCL 1 MG/ML IJ SOLN
INTRAMUSCULAR | Status: AC
  Filled 2018-07-26: qty 1

## 2018-07-26 MED ORDER — PROPOFOL 10 MG/ML IV BOLUS
INTRAVENOUS | Status: DC | PRN
  Administered 2018-07-26: 200 mg via INTRAVENOUS

## 2018-07-26 MED ORDER — GLUCAGON HCL RDNA (DIAGNOSTIC) 1 MG IJ SOLR
INTRAMUSCULAR | Status: DC | PRN
  Administered 2018-07-26 (×5): 0.25 mg via INTRAVENOUS

## 2018-07-26 MED ORDER — GLUCAGON HCL RDNA (DIAGNOSTIC) 1 MG IJ SOLR
INTRAMUSCULAR | Status: AC
  Filled 2018-07-26: qty 1

## 2018-07-26 MED ORDER — HYDRALAZINE HCL 20 MG/ML IJ SOLN
10.0000 mg | Freq: Once | INTRAMUSCULAR | Status: AC
  Administered 2018-07-26: 10 mg via INTRAVENOUS

## 2018-07-26 MED ORDER — FENTANYL CITRATE (PF) 250 MCG/5ML IJ SOLN
INTRAMUSCULAR | Status: DC | PRN
  Administered 2018-07-26 (×2): 50 ug via INTRAVENOUS

## 2018-07-26 MED ORDER — INDOMETHACIN 50 MG RE SUPP
RECTAL | Status: AC
  Filled 2018-07-26: qty 2

## 2018-07-26 MED ORDER — LACTATED RINGERS IV SOLN
INTRAVENOUS | Status: DC
  Administered 2018-07-26: 08:00:00 via INTRAVENOUS

## 2018-07-26 MED ORDER — INDOMETHACIN 50 MG RE SUPP
RECTAL | Status: DC | PRN
  Administered 2018-07-26: 100 mg via RECTAL

## 2018-07-26 MED ORDER — ACETAMINOPHEN 500 MG PO TABS
1000.0000 mg | ORAL_TABLET | Freq: Three times a day (TID) | ORAL | Status: DC
  Administered 2018-07-26 – 2018-07-30 (×11): 1000 mg via ORAL
  Filled 2018-07-26 (×11): qty 2

## 2018-07-26 NOTE — Progress Notes (Signed)
Day of Surgery    CC: Abdominal pain  Subjective: Patient is back from her ERCP she is rather sleepy.  Port sites look fine.  Objective: Vital signs in last 24 hours: Temp:  [97.9 F (36.6 C)-98.3 F (36.8 C)] 98.3 F (36.8 C) (03/19 0706) Pulse Rate:  [54-64] 64 (03/19 0706) Resp:  [16-20] 20 (03/19 0706) BP: (101-129)/(55-69) 128/58 (03/19 0706) SpO2:  [96 %-100 %] 97 % (03/19 0706) Weight:  [133.8 kg] 133.8 kg (03/19 0706) Last BM Date: 07/23/18 960 p.o. 1900 IV 2300 urine Afebrile vital signs are stable LFTs are stable still elevated.  Intake/Output from previous day: 03/18 0701 - 03/19 0700 In: 2960.8 [P.O.:960; I.V.:1900.8; IV Piggyback:100] Out: 2300 [Urine:2300] Intake/Output this shift: Total I/O In: 800 [I.V.:800] Out: 0   General appearance: cooperative and Still sleepy from her ERCP/anesthesia. Resp: clear to auscultation bilaterally GI: Soft, sore, port sites look fine.  Lab Results:  Recent Labs    07/25/18 0408 07/26/18 0419  WBC 10.1 6.1  HGB 12.7 12.0  HCT 41.5 37.9  PLT 243 180    BMET Recent Labs    07/25/18 0408 07/26/18 0419  NA 138 139  K 4.1 4.3  CL 106 104  CO2 24 28  GLUCOSE 141* 109*  BUN 9 10  CREATININE 0.69 0.83  CALCIUM 9.2 8.8*   PT/INR No results for input(s): LABPROT, INR in the last 72 hours.  Recent Labs  Lab 07/23/18 1850 07/24/18 0554 07/25/18 0408 07/26/18 0419  AST 116* 294* 499* 397*  ALT 52* 191* 449* 561*  ALKPHOS 68 74 88 98  BILITOT 1.6* 2.2* 2.5* 2.2*  PROT 8.4* 7.3 7.2 7.1  ALBUMIN 4.6 3.9 3.7 3.6     Lipase     Component Value Date/Time   LIPASE 28 07/26/2018 0419     Medications: . [MAR Hold] estradiol  0.05 mg Transdermal Weekly  . [MAR Hold] fluticasone  2 spray Each Nare Daily  . [MAR Hold] gabapentin  1,200 mg Oral QHS  . indomethacin  100 mg Rectal Once  . [MAR Hold] levothyroxine  75 mcg Oral Q0600  . [MAR Hold] LORazepam  0.5 mg Intravenous Once  . [MAR Hold] mupirocin  ointment  1 application Nasal BID  . [MAR Hold] sertraline  100 mg Oral Daily   . 0.45 % NaCl with KCl 20 mEq / L 100 mL/hr at 07/26/18 0317  . cefTRIAXone (ROCEPHIN)  IV 2 g (07/26/18 0319)  . lactated ringers 50 mL/hr at 07/24/18 1335    Assessment/Plan Hx of ovarian cancer/BSS/TAH/chemotherapy -2016 Hypothyroid Hx anxiety Lower extremity neuropathy Morbid obesity-BMI 44.8 Hx exercise-induced asthma  Cholelithiasis/cholecystitis/probable choledocholithiasis Elevated LFT's Laparoscopic cholecystectomy with IOC, 07/24/18, Dr. Armandina Gemma ERCP, sphincterotomy,and balloon stone extraction 07/26/2018 Dr. Lucio Edward  FEN: IV fluids/n.p.o.@0400  ID: Rocephin 3/17>> day 2 DVT: SCDs Follow-up: [facility]  Plan: We will advance her diet today.  Mobilize, decrease IV fluids, recheck labs in a.m. and aim for discharge tomorrow.       LOS: 1 day    Annell Canty 07/26/2018 [phone]

## 2018-07-26 NOTE — Anesthesia Preprocedure Evaluation (Signed)
Anesthesia Evaluation  Patient identified by MRN, date of birth, ID band Patient awake    Reviewed: Allergy & Precautions, NPO status , Patient's Chart, lab work & pertinent test results  History of Anesthesia Complications (+) PONV and history of anesthetic complications  Airway Mallampati: III  TM Distance: >3 FB Neck ROM: Full    Dental  (+) Teeth Intact, Dental Advisory Given   Pulmonary asthma , sleep apnea and Continuous Positive Airway Pressure Ventilation ,    Pulmonary exam normal breath sounds clear to auscultation       Cardiovascular hypertension, Pt. on medications Normal cardiovascular exam Rhythm:Regular Rate:Normal     Neuro/Psych  Headaches, PSYCHIATRIC DISORDERS Anxiety Depression  Neuromuscular disease    GI/Hepatic GERD  Medicated and Controlled,Cholecystitis s/p cholecystectomy Choledocholithiasis    Endo/Other  Hypothyroidism Morbid obesity  Renal/GU negative Renal ROS     Musculoskeletal negative musculoskeletal ROS (+)   Abdominal   Peds  Hematology negative hematology ROS (+)   Anesthesia Other Findings Day of surgery medications reviewed with the patient.  Reproductive/Obstetrics negative OB ROS                             Anesthesia Physical  Anesthesia Plan  ASA: III  Anesthesia Plan: General   Post-op Pain Management:    Induction: Intravenous  PONV Risk Score and Plan: 4 or greater and Diphenhydramine, Scopolamine patch - Pre-op, Midazolam, Dexamethasone and Ondansetron  Airway Management Planned: Oral ETT  Additional Equipment:   Intra-op Plan:   Post-operative Plan: Extubation in OR  Informed Consent: I have reviewed the patients History and Physical, chart, labs and discussed the procedure including the risks, benefits and alternatives for the proposed anesthesia with the patient or authorized representative who has indicated his/her  understanding and acceptance.     Dental advisory given  Plan Discussed with: CRNA  Anesthesia Plan Comments:         Anesthesia Quick Evaluation

## 2018-07-26 NOTE — Op Note (Addendum)
[facility] Patient Name: [name] Procedure Date: 07/26/2018 MRN: [id number] Attending MD: Ladene Artist , MD Date of Birth: 1970/06/02 CSN: [id number] Age: 48 Admit Type: Inpatient Procedure:                ERCP Indications:              Abdominal pain of suspected biliary origin,                            Abnormal intraoperative cholangiogram, Abnormal                            liver function tests Providers:                Pricilla Riffle. Fuller Plan, MD, Carlyn Reichert, RN, Elspeth Cho Tech., Technician, Glenis Smoker, CRNA Referring MD:             Earnstine Regal, MD Medicines:                General Anesthesia Complications:            No immediate complications. Estimated Blood Loss:     Estimated blood loss: none. Procedure:                Pre-Anesthesia Assessment:                           - Prior to the procedure, a History and Physical                            was performed, and patient medications and                            allergies were reviewed. The patient's tolerance of                            previous anesthesia was also reviewed. The risks                            and benefits of the procedure and the sedation                            options and risks were discussed with the patient.                            All questions were answered, and informed consent                            was obtained. Prior Anticoagulants: The patient has                            taken no previous anticoagulant or antiplatelet  agents. ASA Grade Assessment: II - A patient with                            mild systemic disease. After reviewing the risks                            and benefits, the patient was deemed in                            satisfactory condition to undergo the procedure.                           After obtaining informed consent, the scope was   passed under direct vision. Throughout the                            procedure, the patient's blood pressure, pulse, and                            oxygen saturations were monitored continuously. The                            ERCP was accomplished without difficulty. The                            patient tolerated the procedure well. The [id number]-Q180V                            ([id number] Olympus duodenoscope was introduced                            through the and used to inject contrast into. Scope In: Scope Out: Findings:      A scout film of the abdomen was obtained. Surgical clips, consistent       with a previous cholecystectomy, were seen in the area of the right       upper quadrant of the abdomen. The esophagus was successfully intubated       under direct vision. The scope was advanced to a normal major papilla in       the descending duodenum without detailed examination of the pharynx,       larynx and associated structures, and upper GI tract. The upper GI tract       was grossly normal. A straight Roadrunner wire (35) was passed into the       PD and left in place to facilitate cannulation of the biliary tree. The       biliary tree was cannulated with a Revolution wire (25). The short-nosed       traction sphincterotome was passed over the guidewire and the bile duct       was then deeply cannulated. Contrast was injected. The PD wire was       removed. I personally interpreted the bile duct images. There was       appropriate flow of contrast through the ducts. The common bile duct       contained one stone, which was 3 mm in diameter. A cholecystectomy had  been performed. A 7 mm biliary sphincterotomy was made with a traction       (standard) sphincterotome using ERBE electrocautery. There was no       post-sphincterotomy bleeding. The biliary tree was swept several times       with a 12 mm balloon starting at the bifurcation. One stone was removed.       No stones  remained. Excellent biliary drainage following stone removal.       The final occlusion cholangiogram appeared normal post cholecystectomy.       The PD was not injected with contrast by intention. Impression:               - The patient has had a cholecystectomy.                           - Choledocholithiasis was found. Complete removal                            was accomplished by biliary sphincterotomy and                            balloon extraction.                           - A biliary sphincterotomy was performed.                           - The biliary tree was swept. Moderate Sedation:      Not Applicable - Patient had care per Anesthesia. Recommendation:           - Avoid aspirin and nonsteroidal anti-inflammatory                            medicines for 1 week except for post ERCP Indocin.                           - Clear liquid diet today, then advance as                            tolerated to full liquid diet later today.                           - Check LFTs tomorrow and again in 1 month.                           - Return to hospital ward for ongoing care. Procedure Code(s):        --- Professional ---                           [phone], Endoscopic retrograde                            cholangiopancreatography (ERCP); with removal of                            calculi/debris from biliary/pancreatic duct(s)  43262, Endoscopic retrograde                            cholangiopancreatography (ERCP); with                            sphincterotomy/papillotomy Diagnosis Code(s):        --- Professional ---                           Z90.49, Acquired absence of other specified parts                            of digestive tract                           K80.50, Calculus of bile duct without cholangitis                            or cholecystitis without obstruction                           R10.9, Unspecified abdominal pain                            R93.2, Abnormal findings on diagnostic imaging of                            liver and biliary tract                           R94.5, Abnormal results of liver function studies CPT copyright 2018 American [employer] Association. All rights reserved. The codes documented in this report are preliminary and upon coder review may  be revised to meet current compliance requirements. Ladene Artist, MD 07/26/2018 9:01:28 AM This report has been signed electronically. Number of Addenda: 0

## 2018-07-26 NOTE — Interval H&P Note (Signed)
History and Physical Interval Note:  07/26/2018 7:42 AM  [name]  has presented today for surgery, with the diagnosis of choledocholithiasis.  The various methods of treatment have been discussed with the patient and family. After consideration of risks, benefits and other options for treatment, the patient has consented to  Procedure(s): ENDOSCOPIC RETROGRADE CHOLANGIOPANCREATOGRAPHY (ERCP) (N/A) as a surgical intervention.  The patient's history has been reviewed, patient examined, no change in status, stable for surgery.  I have reviewed the patient's chart and labs.  Questions were answered to the patient's satisfaction.     Pricilla Riffle. Fuller Plan

## 2018-07-26 NOTE — Anesthesia Postprocedure Evaluation (Signed)
Anesthesia Post Note  Patient: Malyssa Maris  Procedure(s) Performed: ENDOSCOPIC RETROGRADE CHOLANGIOPANCREATOGRAPHY (ERCP) (N/A ) SPHINCTEROTOMY REMOVAL OF STONES     Patient location during evaluation: PACU Anesthesia Type: General Level of consciousness: awake and alert, awake and oriented Pain management: pain level controlled Vital Signs Assessment: post-procedure vital signs reviewed and stable Respiratory status: spontaneous breathing, nonlabored ventilation and respiratory function stable Cardiovascular status: blood pressure returned to baseline and stable Postop Assessment: no apparent nausea or vomiting Anesthetic complications: no    Last Vitals:  Vitals:   07/26/18 1208 07/26/18 1212  BP:  135/83  Pulse:  (!) 57  Resp:    Temp: 36.4 C 36.4 C  SpO2:  96%    Last Pain:  Vitals:   07/26/18 1212  TempSrc: Oral  PainSc:                  Catalina Gravel

## 2018-07-26 NOTE — Anesthesia Procedure Notes (Signed)
Date/Time: 07/26/2018 9:02 AM Performed by: Cynda Familia, CRNA Oxygen Delivery Method: Simple face mask Placement Confirmation: positive ETCO2 and breath sounds checked- equal and bilateral Dental Injury: Teeth and Oropharynx as per pre-operative assessment  Comments: RN removed bite block

## 2018-07-26 NOTE — Transfer of Care (Signed)
Immediate Anesthesia Transfer of Care Note  Patient: [name]  Procedure(s) Performed: ENDOSCOPIC RETROGRADE CHOLANGIOPANCREATOGRAPHY (ERCP) (N/A ) SPHINCTEROTOMY REMOVAL OF STONES  Patient Location: PACU and Endoscopy Unit  Anesthesia Type:General  Level of Consciousness: sedated  Airway & Oxygen Therapy: Patient Spontanous Breathing and Patient connected to face mask oxygen  Post-op Assessment: Report given to RN and Post -op Vital signs reviewed and stable  Post vital signs: Reviewed and stable  Last Vitals:  Vitals Value Taken Time  BP 164/107 07/26/2018  9:07 AM  Temp    Pulse 64 07/26/2018  9:08 AM  Resp 18 07/26/2018  9:08 AM  SpO2 100 % 07/26/2018  9:08 AM  Vitals shown include unvalidated device data.  Last Pain:  Vitals:   07/26/18 0706  TempSrc: Oral  PainSc: 5       Patients Stated Pain Goal: 2 (74/82/70 7867)  Complications: No apparent anesthesia complications

## 2018-07-26 NOTE — Anesthesia Procedure Notes (Signed)
Procedure Name: Intubation Date/Time: 07/26/2018 7:55 AM Performed by: Cynda Familia, CRNA Pre-anesthesia Checklist: Patient identified, Emergency Drugs available, Suction available and Patient being monitored Patient Re-evaluated:Patient Re-evaluated prior to induction Oxygen Delivery Method: Circle System Utilized Preoxygenation: Pre-oxygenation with 100% oxygen Induction Type: IV induction Ventilation: Mask ventilation without difficulty Laryngoscope Size: Miller and 2 Grade View: Grade I Tube type: Oral Tube size: 7.0 mm Number of attempts: 1 Airway Equipment and Method: Stylet Placement Confirmation: ETT inserted through vocal cords under direct vision,  positive ETCO2 and breath sounds checked- equal and bilateral Secured at: 22 cm Tube secured with: Tape Dental Injury: Teeth and Oropharynx as per pre-operative assessment  Comments: Smooth IV induction Turk-- intubation AM CRNA atraumatic-- teeth and mouth as preop-- bilat BS Gifford Shave

## 2018-07-27 ENCOUNTER — Encounter (HOSPITAL_COMMUNITY): Payer: Self-pay | Admitting: Gastroenterology

## 2018-07-27 DIAGNOSIS — R932 Abnormal findings on diagnostic imaging of liver and biliary tract: Secondary | ICD-10-CM

## 2018-07-27 DIAGNOSIS — K8050 Calculus of bile duct without cholangitis or cholecystitis without obstruction: Secondary | ICD-10-CM

## 2018-07-27 LAB — CBC
HCT: 37.3 % (ref 36.0–46.0)
Hemoglobin: 12.1 g/dL (ref 12.0–15.0)
MCH: 30.3 pg (ref 26.0–34.0)
MCHC: 32.4 g/dL (ref 30.0–36.0)
MCV: 93.5 fL (ref 80.0–100.0)
Platelets: 189 10*3/uL (ref 150–400)
RBC: 3.99 MIL/uL (ref 3.87–5.11)
RDW: 13 % (ref 11.5–15.5)
WBC: 12 10*3/uL — ABNORMAL HIGH (ref 4.0–10.5)
nRBC: 0 % (ref 0.0–0.2)

## 2018-07-27 LAB — LIPASE, BLOOD: Lipase: 1636 U/L — ABNORMAL HIGH (ref 11–51)

## 2018-07-27 MED ORDER — KETOROLAC TROMETHAMINE 30 MG/ML IJ SOLN
30.0000 mg | Freq: Three times a day (TID) | INTRAMUSCULAR | Status: DC
  Administered 2018-07-27 – 2018-07-29 (×7): 30 mg via INTRAVENOUS
  Filled 2018-07-27 (×8): qty 1

## 2018-07-27 MED ORDER — ENOXAPARIN SODIUM 80 MG/0.8ML ~~LOC~~ SOLN
65.0000 mg | SUBCUTANEOUS | Status: DC
  Administered 2018-07-27 – 2018-07-29 (×3): 65 mg via SUBCUTANEOUS
  Filled 2018-07-27 (×3): qty 0.8

## 2018-07-27 MED ORDER — ENOXAPARIN SODIUM 40 MG/0.4ML ~~LOC~~ SOLN: 40.0000 mg | SUBCUTANEOUS | Status: DC

## 2018-07-27 NOTE — Progress Notes (Addendum)
Progress Note   Subjective  Chief Complaint: Choledocholithiasis, s/p ERCP 07/26/18  This morning, patient described increased epigastric ttp and "gas pain", required Morphine last night. Did eat two regular meals yesterday for lunch and dinner. Also required Zofran overnight for nausea. No fever or chills.    Objective   Vital signs in last 24 hours: Temp:  [97.6 F (36.4 C)-98.7 F (37.1 C)] 98.7 F (37.1 C) (03/20 0956) Pulse Rate:  [49-65] 62 (03/20 0956) Resp:  [15-18] 18 (03/20 0956) BP: (103-157)/(62-98) 157/98 (03/20 0956) SpO2:  [93 %-99 %] 99 % (03/20 0956) Last BM Date: 07/25/18 General:    white female in NAD Heart:  Regular rate and rhythm; no murmurs Lungs: Respirations even and unlabored, lungs CTA bilaterally Abdomen:  Soft, Moderate epigastric and RUQ ttp and nondistended. Normal bowel sounds. Extremities:  Without edema. Neurologic:  Alert and oriented,  grossly normal neurologically. Psych:  Cooperative. Normal mood and affect.  Intake/Output from previous day: 03/19 0701 - 03/20 0700 In: 2658.2 [P.O.:660; I.V.:1998.2] Out: 2000 [Urine:2000] Intake/Output this shift: Total I/O In: 240 [P.O.:240] Out: -   Lab Results: Recent Labs    07/25/18 0408 07/26/18 0419 07/27/18 0432  WBC 10.1 6.1 12.0*  HGB 12.7 12.0 12.1  HCT 41.5 37.9 37.3  PLT 243 180 189   BMET Recent Labs    07/25/18 0408 07/26/18 0419 07/27/18 0432  NA 138 139 139  K 4.1 4.3 3.4*  CL 106 104 105  CO2 24 28 26   GLUCOSE 141* 109* 138*  BUN 9 10 14   CREATININE 0.69 0.83 0.77  CALCIUM 9.2 8.8* 8.8*   LFT Recent Labs    07/27/18 0432  PROT 6.6  ALBUMIN 3.4*  AST 142*  ALT 381*  ALKPHOS 95  BILITOT 1.2    Studies/Results: Dg Ercp With Sphincterotomy  Result Date: 07/26/2018 CLINICAL DATA:  48 year old female with a history of sphincterotomy and common bile duct choledocholithiasis EXAM: ERCP TECHNIQUE: Multiple spot images obtained with the fluoroscopic device  and submitted for interpretation post-procedure. FLUOROSCOPY TIME:  Fluoroscopy Time:  4 minutes 51 seconds COMPARISON:  07/24/2018, ultrasound 07/23/2018 FINDINGS: Limited intraoperative fluoroscopic spot images during ERCP. Initial image demonstrates endoscope projecting over the upper abdomen. Surgical clips of prior cholecystectomy. There is subsequently selection of the extrahepatic biliary ducts with a safety wire, partial opacification of the ducts with contrast, with no enlarged ductal system. There are transient rounded filling defects within the common hepatic duct, potentially debris/stones or gas. IMPRESSION: Limited images during ERCP demonstrates extrahepatic ductal system within normal limits in diameter, with transient filling defects potentially representing gas or stones/debris. Cholecystectomy. Please refer to the dictated operative report for full details of intraoperative findings and procedure. Electronically Signed   By: Corrie Mckusick D.O.   On: 07/26/2018 09:15    Assessment / Plan:   Assessment: 1. Choledocholithiasis: s/p ERCP 07/26/18 with sphincterotomy- now with post ERCP pancreatiis 2. Post ERCP Pancreatitis  Plan: 1. Increased fluids to 137ml/hr for 2 L, then decrease to 165ml/hr 2. Diet back to clears today, can increase back to full liquid tomorrow morning if doing ok and possibly low fat for lunch if pain is better 3. Continue IV pain meds and antiemetics 4. Repeat CMP, Lipase in the morning 5. If patient better clinically tomorrow, could be discharged home 6. Please await any further recommendations from Dr. Lyndel Safe later this morning  Thank you for your kind consultation, we will follow.    LOS: 2 days  Lavone Nian Lemmon  07/27/2018, 10:20 AM    Attending physician's note   I have taken an interval history, reviewed the chart and examined the patient. I agree with the Advanced Practitioner's note, impression and recommendations.  Discussed with patient's  husband.  48yr old WF with biliary colic s/p ap chole with IOC 3/17, s/p ERCP with biliary sphincterotomy and stone extraction 3/19, with mild post ERCP pancreatitis. Plan: Supportive treatment-IV fluids, pain control, repeat labs in a.m., ambulate.  Tolerating clear liquid diet.  If not better, will consider CT abdomen.  Carmell Austria, MD

## 2018-07-27 NOTE — Plan of Care (Signed)
  Problem: Education: Goal: Knowledge of General Education information will improve Description Including pain rating scale, medication(s)/side effects and non-pharmacologic comfort measures Outcome: Progressing   Problem: Health Behavior/Discharge Planning: Goal: Ability to manage health-related needs will improve Outcome: Progressing   Problem: Clinical Measurements: Goal: Ability to maintain clinical measurements within normal limits will improve Outcome: Progressing Goal: Will remain free from infection Outcome: Progressing Goal: Cardiovascular complication will be avoided Outcome: Progressing   Problem: Activity: Goal: Risk for activity intolerance will decrease Outcome: Progressing   Problem: Coping: Goal: Level of anxiety will decrease Outcome: Progressing   Problem: Skin Integrity: Goal: Demonstration of wound healing without infection will improve Outcome: Progressing

## 2018-07-27 NOTE — Progress Notes (Addendum)
1 Day Post-Op    CC: Abdominal pain  Subjective: Patient is having pain again mid epigastric area.  Sites all look fine.  As noted below she is lipase is elevated she has post ERCP pancreatitis.  She says the tramadol does not help.  Objective: Vital signs in last 24 hours: Temp:  [97.6 F (36.4 C)-98.4 F (36.9 C)] 98.3 F (36.8 C) (03/20 0450) Pulse Rate:  [49-71] 55 (03/20 0600) Resp:  [15-21] 17 (03/20 0450) BP: (103-161)/(62-98) 118/93 (03/20 0450) SpO2:  [93 %-100 %] 98 % (03/20 0450) Last BM Date: 07/25/18 660 PO 1998 PO Stool 0 Afebrile, VSS lipase 1636 LFT's better WBC 12.0 ERCP 3/19  Intake/Output from previous day: 03/19 0701 - 03/20 0700 In: 2658.2 [P.O.:660; I.V.:1998.2] Out: 2000 [Urine:2000] Intake/Output this shift: No intake/output data recorded.  General appearance: alert, cooperative and no distress Resp: clear to auscultation bilaterally GI: Soft, ongoing mid epigastric discomfort.  Port sites all look good.  Lab Results:  Recent Labs    07/26/18 0419 07/27/18 0432  WBC 6.1 12.0*  HGB 12.0 12.1  HCT 37.9 37.3  PLT 180 189    BMET Recent Labs    07/26/18 0419 07/27/18 0432  NA 139 139  K 4.3 3.4*  CL 104 105  CO2 28 26  GLUCOSE 109* 138*  BUN 10 14  CREATININE 0.83 0.77  CALCIUM 8.8* 8.8*   PT/INR No results for input(s): LABPROT, INR in the last 72 hours.  Recent Labs  Lab 07/23/18 1850 07/24/18 0554 07/25/18 0408 07/26/18 0419 07/27/18 0432  AST 116* 294* 499* 397* 142*  ALT 52* 191* 449* 561* 381*  ALKPHOS 68 74 88 98 95  BILITOT 1.6* 2.2* 2.5* 2.2* 1.2  PROT 8.4* 7.3 7.2 7.1 6.6  ALBUMIN 4.6 3.9 3.7 3.6 3.4*     Lipase     Component Value Date/Time   LIPASE 1,636 (H) 07/27/2018 0432     Medications: . acetaminophen  1,000 mg Oral Q8H  . [START ON 07/30/2018] estradiol  0.05 mg Transdermal Weekly  . fluticasone  2 spray Each Nare Daily  . gabapentin  1,200 mg Oral QHS  . indomethacin  100 mg Rectal Once   . levothyroxine  75 mcg Oral Q0600  . LORazepam  0.5 mg Intravenous Once  . mupirocin ointment  1 application Nasal BID  . sertraline  100 mg Oral Daily   . lactated ringers 50 mL/hr at 07/26/18 1700    Assessment/Plan Hx of ovarian cancer/BSS/TAH/chemotherapy -2016 Hypothyroid Hx anxiety Lower extremity neuropathy Morbid obesity-BMI 44.8 Hx exercise-induced asthma  Cholelithiasis/cholecystitis/probable choledocholithiasis Elevated LFT's Laparoscopic cholecystectomy with IOC, 07/24/18, Dr. Armandina Gemma ERCP, sphincterotomy,and balloon stone extraction 07/26/2018 Dr. Lucio Edward Post ERCP pancreatitis - pain and Lipase 1636  FEN: IV fluids/Heart healthy>> clears ID: Rocephin 3/17- 3/19; none currently DVT: SCDs, add DVT prophylaxis/Lovenox at 1600 today. Follow-up: [facility]  Plan: Negative for back to clear liquids.  Increase her IV fluids,, add Toradol to her pain medications.  DC tramadol.  Recheck labs in the a.m.  Discharge instructions and follow-up appointment are in the AVS.       LOS: 2 days    Leonidus Rowand 07/27/2018 [phone]

## 2018-07-27 NOTE — Progress Notes (Signed)
Brief Pharmacy anti-coagulation note:  Per Surgery consult start enoxaparin PPx today at 1600  Assessment and plan:  CrCl > 137mls/min  CBC WNL  BMI > 30  Enoxaparin (0.5mg /kg) 65mg  SQ q24h starting at 1600  Pharmacy will sign off and follow peripherally  Dolly Rias RPh 07/27/2018, 11:42 AM Pager [phone]

## 2018-07-28 LAB — COMPREHENSIVE METABOLIC PANEL
ALT: 381 U/L — ABNORMAL HIGH (ref 0–44)
ALT: 254 U/L — ABNORMAL HIGH (ref 0–44)
AST: 142 U/L — ABNORMAL HIGH (ref 15–41)
AST: 55 U/L — ABNORMAL HIGH (ref 15–41)
Albumin: 3.4 g/dL — ABNORMAL LOW (ref 3.5–5.0)
Albumin: 3.3 g/dL — ABNORMAL LOW (ref 3.5–5.0)
Alkaline Phosphatase: 95 U/L (ref 38–126)
Alkaline Phosphatase: 72 U/L (ref 38–126)
Anion gap: 8 (ref 5–15)
Anion gap: 7 (ref 5–15)
BUN: 14 mg/dL (ref 6–20)
BUN: 11 mg/dL (ref 6–20)
CO2: 26 mmol/L (ref 22–32)
CO2: 25 mmol/L (ref 22–32)
Calcium: 8.8 mg/dL — ABNORMAL LOW (ref 8.9–10.3)
Calcium: 8.7 mg/dL — ABNORMAL LOW (ref 8.9–10.3)
Chloride: 105 mmol/L (ref 98–111)
Chloride: 106 mmol/L (ref 98–111)
Creatinine, Ser: 0.77 mg/dL (ref 0.44–1.00)
Creatinine, Ser: 0.69 mg/dL (ref 0.44–1.00)
GFR calc Af Amer: >60 mL/min (ref >60)
GFR calc Af Amer: >60 mL/min (ref >60)
GFR calc non Af Amer: >60 mL/min (ref >60)
GFR calc non Af Amer: >60 mL/min (ref >60)
Glucose, Bld: 138 mg/dL — ABNORMAL HIGH (ref 70–99)
Glucose, Bld: 93 mg/dL (ref 70–99)
POTASSIUM: 3.5 mmol/L (ref 3.5–5.1)
Potassium: 3.4 mmol/L — ABNORMAL LOW (ref 3.5–5.1)
Sodium: 139 mmol/L (ref 135–145)
Sodium: 138 mmol/L (ref 135–145)
Total Bilirubin: 1.2 mg/dL (ref 0.3–1.2)
Total Bilirubin: 0.9 mg/dL (ref 0.3–1.2)
Total Protein: 6.6 g/dL (ref 6.5–8.1)
Total Protein: 6.3 g/dL — ABNORMAL LOW (ref 6.5–8.1)

## 2018-07-28 LAB — LIPASE, BLOOD: LIPASE: 154 U/L — AB (ref 11–51)

## 2018-07-28 LAB — CBC
HCT: 37.8 % (ref 36.0–46.0)
Hemoglobin: 11.8 g/dL — ABNORMAL LOW (ref 12.0–15.0)
MCH: 29.6 pg (ref 26.0–34.0)
MCHC: 31.2 g/dL (ref 30.0–36.0)
MCV: 95 fL (ref 80.0–100.0)
NRBC: 0 % (ref 0.0–0.2)
Platelets: 201 10*3/uL (ref 150–400)
RBC: 3.98 MIL/uL (ref 3.87–5.11)
RDW: 13.2 % (ref 11.5–15.5)
WBC: 8.8 10*3/uL (ref 4.0–10.5)

## 2018-07-28 MED ORDER — MORPHINE SULFATE (PF) 2 MG/ML IV SOLN
1.0000 mg | INTRAVENOUS | Status: DC | PRN
  Administered 2018-07-29 (×2): 2 mg via INTRAVENOUS
  Filled 2018-07-28 (×2): qty 1

## 2018-07-28 MED ORDER — HYDROMORPHONE HCL 1 MG/ML IJ SOLN: 1.0000 mg | INTRAMUSCULAR | Status: DC | PRN

## 2018-07-28 MED ORDER — ALUM & MAG HYDROXIDE-SIMETH 200-200-20 MG/5ML PO SUSP
30.0000 mL | Freq: Four times a day (QID) | ORAL | Status: DC | PRN
  Administered 2018-07-28 – 2018-07-29 (×2): 30 mL via ORAL
  Filled 2018-07-28 (×2): qty 30

## 2018-07-28 NOTE — Progress Notes (Signed)
IMPRESSION and PLAN:    #1.  Choledocholithiasis status post ERCP with biliary sphincterotomy and stone extraction 3/19 with mild post ERCP pancreatitis. #2.  Biliary colic status post lap chole with IOC 3/17 Plan: -Advance diet gradually. -We will keep her in for 24 hours more. -Ambulate. -Pain control. -Trend CBC, LFTs and lipase. -If not better by tomorrow, will get CT Abdo.  She does feel better and wants to hold off.  I do agree.          [name] is a 48 y.o. female  Was doing much better this morning Had breakfast (regular) But then started having more abdominal pain with bloating. No nausea/vomiting. No fever or chills.  Lipase coming down very well.   Past Medical History:  Diagnosis Date   Anxiety    Elevated blood pressure, situational 05/05/2016   Endometriosis    Exercise-induced asthma 09/08/2015   Ovarian cancer, bilateral (Notus) 11/28/2014   PONV (postoperative nausea and vomiting)    Thyroid disease     Current Facility-Administered Medications  Medication Dose Route Frequency Provider Last Rate Last Dose   acetaminophen (TYLENOL) tablet 1,000 mg  1,000 mg Oral Q8H Earnstine Regal, PA-C   1,000 mg at 07/28/18 1151   ALPRAZolam (XANAX) tablet 1 mg  1 mg Oral BID PRN Earnstine Regal, PA-C   1 mg at 07/27/18 1405   enoxaparin (LOVENOX) injection 65 mg  65 mg Subcutaneous Q24H Earnstine Regal, PA-C   65 mg at 07/27/18 1607   [START ON 07/30/2018] estradiol (CLIMARA - Dosed in mg/24 hr) patch 0.05 mg  0.05 mg Transdermal Weekly Earnstine Regal, PA-C       fluticasone [facility]) 50 MCG/ACT nasal spray 2 spray  2 spray Each Nare Daily Earnstine Regal, PA-C   2 spray at 07/28/18 0932   gabapentin (NEURONTIN) capsule 1,200 mg  1,200 mg Oral QHS Earnstine Regal, PA-C   1,200 mg at 07/27/18 2115   HYDROmorphone (DILAUDID) injection 1 mg  1 mg Intravenous Q2H PRN Michael Boston, MD       ketorolac (TORADOL) 30 MG/ML injection 30  mg  30 mg Intravenous Q8H Earnstine Regal, PA-C   30 mg at 07/28/18 1326   lactated ringers infusion   Intravenous Continuous Levin Erp, [place] 100 mL/hr at 07/28/18 0935     levothyroxine (SYNTHROID, LEVOTHROID) tablet 75 mcg  75 mcg Oral Q0600 Earnstine Regal, PA-C   75 mcg at 07/28/18 0547   LORazepam (ATIVAN) injection 0.5 mg  0.5 mg Intravenous Once Willia Craze, NP       morphine 2 MG/ML injection 1-3 mg  1-3 mg Intravenous Q2H PRN Michael Boston, MD       mupirocin ointment (BACTROBAN) 2 % 1 application  1 application Nasal BID Earnstine Regal, PA-C   1 application at 81/01/75 0932   ondansetron (ZOFRAN-ODT) disintegrating tablet 4 mg  4 mg Oral Q6H PRN Earnstine Regal, PA-C   4 mg at 07/26/18 1400   Or   ondansetron (ZOFRAN) injection 4 mg  4 mg Intravenous Q6H PRN Earnstine Regal, PA-C   4 mg at 07/26/18 2050   oxyCODONE (Oxy IR/ROXICODONE) immediate release tablet 5-10 mg  5-10 mg Oral Q4H PRN Earnstine Regal, PA-C   10 mg at 07/28/18 1025   polyvinyl alcohol (LIQUIFILM TEARS) 1.4 % ophthalmic solution 1 drop  1 drop Both Eyes Daily PRN Earnstine Regal, PA-C       sertraline (ZOLOFT) tablet 100 mg  100  mg Oral Daily Earnstine Regal, PA-C   100 mg at 07/28/18 0932   zolpidem (AMBIEN) tablet 5 mg  5 mg Oral QHS PRN Earnstine Regal, PA-C   5 mg at 07/27/18 2236    Past Surgical History:  Procedure Laterality Date   ABDOMINAL HYSTERECTOMY  11/10/14   at [facility], Exp lap, supracervical hyst, BSO, infracolic omentectomy, lymphadenectomy, aortic lymph node sampling   CHOLECYSTECTOMY N/A 07/24/2018   Procedure: LAPAROSCOPIC CHOLECYSTECTOMY WITH INTRAOPERATIVE CHOLANGIOGRAM;  Surgeon: Armandina Gemma, MD;  Location: [facility];  Service: General;  Laterality: N/A;   ERCP N/A 07/26/2018   Procedure: ENDOSCOPIC RETROGRADE CHOLANGIOPANCREATOGRAPHY (ERCP);  Surgeon: Ladene Artist, MD;  Location: [facility];  Service: Endoscopy;  Laterality: N/A;   IR GENERIC  HISTORICAL  05/12/2016   IR REMOVAL TUN ACCESS W/ PORT W/O FL MOD SED 05/12/2016 [facility]-INTERV RAD   REMOVAL OF STONES  07/26/2018   Procedure: REMOVAL OF STONES;  Surgeon: Ladene Artist, MD;  Location: [facility];  Service: Endoscopy;;   SPHINCTEROTOMY  07/26/2018   Procedure: Joan Mayans;  Surgeon: Ladene Artist, MD;  Location: [facility];  Service: Endoscopy;;    Family History  Problem Relation Age of Onset   Hypertension Mother    Diabetes Mother    Stroke Mother        Deceased   Atrial fibrillation Father        Living   Diabetes Maternal Aunt    Heart failure Maternal Grandmother    Heart attack Maternal Grandfather    Dementia Paternal Grandmother    Dementia Paternal Grandfather    Healthy Brother     Social History   Tobacco Use   Smoking status: Never Smoker   Smokeless tobacco: Never Used  Substance Use Topics   Alcohol use: Yes    Alcohol/week: 0.0 standard drinks    Comment: Rarely   Drug use: No    Allergies  Allergen Reactions   Moxifloxacin Anaphylaxis    needs epinephrine shot   Quinolones Anaphylaxis, Shortness Of Breath and Swelling   Doxycycline Nausea And Vomiting   Escitalopram Oxalate Other (See Comments)     fatigue   Sulfonamide Derivatives Swelling and Rash    needs epinephrine shot--rash and lip swelling     Review of Systems: All systems reviewed and negative except where noted in HPI.    Physical Exam:     BP (!) 141/80 (BP Location: Right Arm)    Pulse 63    Temp 97.6 F (36.4 C) (Oral)    Resp 18    Ht 5\' 8"  (1.727 m)    Wt 133.8 kg    LMP 11/02/2014    SpO2 98%    BMI 44.85 kg/m  @WEIGHTLAST3 @ GENERAL:  Alert, oriented, cooperative, not in acute distress. PSYCH: :Pleasant, normal mood and affect. HEENT:  conjunctiva pink, mucous membranes moist, neck supple without masses. No jaundice. CARDIAC:  S1 S2 normal. No murmers. PULM: Normal respiratory effort, lungs CTA bilaterally, no  wheezing. ABDOMEN: Inspection: No visible peristalsis, no abnormal pulsations, skin normal.  Palpation/percussion: Soft, nontender, nondistended, no rigidity, no abnormal dullness to percussion, no hepatosplenomegaly and no palpable abdominal masses.  Auscultation: Normal bowel sounds, no abdominal bruits. Rectal exam: Deferred SKIN:  turgor, no lesions seen. Musculoskeletal:  Normal muscle tone, normal strength. NEURO: Alert and oriented x 3, no focal neurologic deficits.   Data Reviewed: I have personally reviewed following labs and imaging studies  CBC: Recent Labs  Lab 07/24/18 0554  07/26/18  0938 07/27/18 0432 07/28/18 0431  WBC 9.5   < > 6.1 12.0* 8.8  NEUTROABS 7.8*  --   --   --   --   HGB 13.0   < > 12.0 12.1 11.8*  HCT 40.8   < > 37.9 37.3 37.8  MCV 94.0   < > 95.7 93.5 95.0  PLT 234   < > 180 189 201   < > = values in this interval not displayed.   Basic Metabolic Panel: Recent Labs  Lab 07/26/18 0419 07/27/18 0432 07/28/18 0431  NA 139 139 138  K 4.3 3.4* 3.5  CL 104 105 106  CO2 28 26 25   GLUCOSE 109* 138* 93  BUN 10 14 11   CREATININE 0.83 0.77 0.69  CALCIUM 8.8* 8.8* 8.7*   GFR: Estimated Creatinine Clearance: 126.1 mL/min (by C-G formula based on SCr of 0.69 mg/dL). Liver Function Tests: Recent Labs  Lab 07/24/18 0554 07/25/18 0408 07/26/18 0419 07/27/18 0432 07/28/18 0431  AST 294* 499* 397* 142* 55*  ALT 191* 449* 561* 381* 254*  ALKPHOS 74 88 98 95 72  BILITOT 2.2* 2.5* 2.2* 1.2 0.9  PROT 7.3 7.2 7.1 6.6 6.3*  ALBUMIN 3.9 3.7 3.6 3.4* 3.3*   Recent Labs  Lab 07/26/18 0419 07/27/18 0432 07/28/18 0431  LIPASE 28 1,636* 154*   No results for input(s): AMMONIA in the last 168 hours. Coagulation Profile: No results for input(s): INR, PROTIME in the last 168 hours. HbA1C: No results for input(s): HGBA1C in the last 72 hours. Lipid Profile: No results for input(s): CHOL, HDL, LDLCALC, TRIG, CHOLHDL, LDLDIRECT in the last 72  hours. Thyroid Function Tests: No results for input(s): TSH, T4TOTAL, FREET4, T3FREE, THYROIDAB in the last 72 hours. Anemia Panel: No results for input(s): VITAMINB12, FOLATE, FERRITIN, TIBC, IRON, RETICCTPCT in the last 72 hours.  Recent Results (from the past 240 hour(s))  Surgical PCR screen     Status: Abnormal   Collection Time: 07/24/18  3:26 AM  Result Value Ref Range Status   MRSA, PCR POSITIVE (A) NEGATIVE Final    Comment: RESULT CALLED TO, READ BACK BY AND VERIFIED WITH: FRADY,H @ 1028 ON [id number] BY POTEAT,S    Staphylococcus aureus POSITIVE (A) NEGATIVE Final    Comment: (NOTE) The Xpert SA Assay (FDA approved for NASAL specimens in patients 104 years of age and older), is one component of a comprehensive surveillance program. It is not intended to diagnose infection nor to guide or monitor treatment. Performed at [facility] [place] [address] [place], [place] [address]       Radiology Studies: Dg Cholangiogram Operative  Result Date: 07/24/2018 CLINICAL DATA:  Cholecystectomy for symptomatic cholelithiasis. EXAM: INTRAOPERATIVE CHOLANGIOGRAM TECHNIQUE: Cholangiographic images from the C-arm fluoroscopic device were submitted for interpretation post-operatively. Please see the procedural report for the amount of contrast and the fluoroscopy time utilized. COMPARISON:  Abdominal ultrasound on 07/23/2018 FINDINGS: Imaging obtained with a C-arm intraoperatively shows normal caliber bile ducts. Contrast can be followed into the distal common bile duct but is not seen to enter the duodenum. Although no discrete filling defect is identified, this may be consistent with a distal CBD calculus. IMPRESSION: Lack of visualization of contrast flow into the duodenum at the level of the distal CBD. A distal CBD calculus cannot be excluded. Electronically Signed   By: Aletta Edouard M.D.   On: 07/24/2018 16:15   Dg Ercp With Sphincterotomy  Result Date:  07/26/2018 CLINICAL DATA:  48 year old  female with a history of sphincterotomy and common bile duct choledocholithiasis EXAM: ERCP TECHNIQUE: Multiple spot images obtained with the fluoroscopic device and submitted for interpretation post-procedure. FLUOROSCOPY TIME:  Fluoroscopy Time:  4 minutes 51 seconds COMPARISON:  07/24/2018, ultrasound 07/23/2018 FINDINGS: Limited intraoperative fluoroscopic spot images during ERCP. Initial image demonstrates endoscope projecting over the upper abdomen. Surgical clips of prior cholecystectomy. There is subsequently selection of the extrahepatic biliary ducts with a safety wire, partial opacification of the ducts with contrast, with no enlarged ductal system. There are transient rounded filling defects within the common hepatic duct, potentially debris/stones or gas. IMPRESSION: Limited images during ERCP demonstrates extrahepatic ductal system within normal limits in diameter, with transient filling defects potentially representing gas or stones/debris. Cholecystectomy. Please refer to the dictated operative report for full details of intraoperative findings and procedure. Electronically Signed   By: Corrie Mckusick D.O.   On: 07/26/2018 09:15   US Abdomen Limited Ruq  Result Date: 07/23/2018 CLINICAL DATA:  Right upper quadrant pain EXAM: ULTRASOUND ABDOMEN LIMITED RIGHT UPPER QUADRANT COMPARISON:  10/11/2017 FINDINGS: Gallbladder: Numerous layering gallstones within the gallbladder. No wall thickening or sonographic Murphy's sign. Small anterior gallbladder wall polyp. Common bile duct: Diameter: Normal caliber, 5 mm Liver: No focal lesion identified. Within normal limits in parenchymal echogenicity. Portal vein is patent on color Doppler imaging with normal direction of blood flow towards the liver. IMPRESSION: Cholelithiasis.  No sonographic evidence of acute cholecystitis. No acute findings. Electronically Signed   By: Rolm Baptise M.D.   On: 07/23/2018 22:10       Demetrio Leighty,MD 07/28/2018, 3:17 PM   CC No ref. provider found

## 2018-07-28 NOTE — Progress Notes (Signed)
2 Days Post-Op  Subjective: Alert and stable.  Says she continues to have right upper quadrant and epigastric pain and pressure.  No nausea no nausea or vomiting. Afebrile.  Heart rate 55.  SPO2 98%  Planning to advance to heart healthy diet this morning Lab work shows lipase down to 154.  ALT 254.  AST 55.  WBC 8800.  Hemoglobin 11.8.  Objective: Vital signs in last 24 hours: Temp:  [97.5 F (36.4 C)-98.7 F (37.1 C)] 97.5 F (36.4 C) (03/21 0555) Pulse Rate:  [55-62] 55 (03/21 0555) Resp:  [16-18] 16 (03/21 0555) BP: (111-157)/(77-98) 118/83 (03/21 0555) SpO2:  [97 %-99 %] 98 % (03/21 0555) Last BM Date: 07/25/18  Intake/Output from previous day: 03/20 0701 - 03/21 0700 In: 2803.3 [P.O.:1080; I.V.:1723.3] Out: 2600 [Urine:2600] Intake/Output this shift: No intake/output data recorded.  General appearance: Alert and cooperative.  Mental status normal.  Slight discomfort Resp: clear to auscultation bilaterally GI: Abdomen is soft but still a little bit tender in epigastrium and right upper quadrant.  Wounds look fine however hypoactive bowel sounds Extremities: extremities normal, atraumatic, no cyanosis or edema  Lab Results:  Results for orders placed or performed during the hospital encounter of 07/23/18 (from the past 24 hour(s))  CBC     Status: Abnormal   Collection Time: 07/28/18  4:31 AM  Result Value Ref Range   WBC 8.8 4.0 - 10.5 K/uL   RBC 3.98 3.87 - 5.11 MIL/uL   Hemoglobin 11.8 (L) 12.0 - 15.0 g/dL   HCT 37.8 36.0 - 46.0 %   MCV 95.0 80.0 - 100.0 fL   MCH 29.6 26.0 - 34.0 pg   MCHC 31.2 30.0 - 36.0 g/dL   RDW 13.2 11.5 - 15.5 %   Platelets 201 150 - 400 K/uL   nRBC 0.0 0.0 - 0.2 %  Comprehensive metabolic panel     Status: Abnormal   Collection Time: 07/28/18  4:31 AM  Result Value Ref Range   Sodium 138 135 - 145 mmol/L   Potassium 3.5 3.5 - 5.1 mmol/L   Chloride 106 98 - 111 mmol/L   CO2 25 22 - 32 mmol/L   Glucose, Bld 93 70 - 99 mg/dL   BUN 11  6 - 20 mg/dL   Creatinine, Ser 0.69 0.44 - 1.00 mg/dL   Calcium 8.7 (L) 8.9 - 10.3 mg/dL   Total Protein 6.3 (L) 6.5 - 8.1 g/dL   Albumin 3.3 (L) 3.5 - 5.0 g/dL   AST 55 (H) 15 - 41 U/L   ALT 254 (H) 0 - 44 U/L   Alkaline Phosphatase 72 38 - 126 U/L   Total Bilirubin 0.9 0.3 - 1.2 mg/dL   GFR calc non Af Amer >60 >60 mL/min   GFR calc Af Amer >60 >60 mL/min   Anion gap 7 5 - 15  Lipase, blood     Status: Abnormal   Collection Time: 07/28/18  4:31 AM  Result Value Ref Range   Lipase 154 (H) 11 - 51 U/L     Studies/Results: No results found.  Marland Kitchen acetaminophen  1,000 mg Oral Q8H  . enoxaparin (LOVENOX) injection  65 mg Subcutaneous Q24H  . [START ON 07/30/2018] estradiol  0.05 mg Transdermal Weekly  . fluticasone  2 spray Each Nare Daily  . gabapentin  1,200 mg Oral QHS  . ketorolac  30 mg Intravenous Q8H  . levothyroxine  75 mcg Oral Q0600  . LORazepam  0.5 mg Intravenous Once  .  mupirocin ointment  1 application Nasal BID  . sertraline  100 mg Oral Daily     Assessment/Plan: s/p Procedure(s): ENDOSCOPIC RETROGRADE CHOLANGIOPANCREATOGRAPHY (ERCP) SPHINCTEROTOMY REMOVAL OF STONES   POD #4.  Laparoscopic cholecystectomy with cholangiogram.  Dr. Armandina Gemma. Seems stable surgically Possibly home tomorrow Discharge instructions and follow-up appointment for [facility] clinic in the [place].   POD #2.  ERCP with sphincterotomy and balloon stone extraction, Dr. Lucio Edward Post ERCP pancreatitis, slowly resolving Not ready to go home today Try heart healthy diet Check labs tomorrow Possible discharge tomorrow  History of ovarian cancer with surgery and chemotherapy 2016 Hypothyroid Morbid obesity Exercise-induced asthma Anxiety Lower extremity neuropathy  @PROBHOSP @  LOS: 3 days    [name] 07/28/2018  . .prob

## 2018-07-29 LAB — COMPREHENSIVE METABOLIC PANEL
ALT: 254 U/L — ABNORMAL HIGH (ref 0–44)
AST: 240 U/L — ABNORMAL HIGH (ref 15–41)
Albumin: 3.2 g/dL — ABNORMAL LOW (ref 3.5–5.0)
Alkaline Phosphatase: 150 U/L — ABNORMAL HIGH (ref 38–126)
Anion gap: 10 (ref 5–15)
BILIRUBIN TOTAL: 2.1 mg/dL — AB (ref 0.3–1.2)
BUN: 12 mg/dL (ref 6–20)
CO2: 21 mmol/L — ABNORMAL LOW (ref 22–32)
Calcium: 8.4 mg/dL — ABNORMAL LOW (ref 8.9–10.3)
Chloride: 106 mmol/L (ref 98–111)
Creatinine, Ser: 0.62 mg/dL (ref 0.44–1.00)
GFR calc Af Amer: >60 mL/min (ref >60)
GFR calc non Af Amer: >60 mL/min (ref >60)
Glucose, Bld: 107 mg/dL — ABNORMAL HIGH (ref 70–99)
Potassium: 4 mmol/L (ref 3.5–5.1)
Sodium: 137 mmol/L (ref 135–145)
Total Protein: 6.3 g/dL — ABNORMAL LOW (ref 6.5–8.1)

## 2018-07-29 LAB — CBC
HCT: 38.6 % (ref 36.0–46.0)
Hemoglobin: 12.7 g/dL (ref 12.0–15.0)
MCH: 29.7 pg (ref 26.0–34.0)
MCHC: 32.9 g/dL (ref 30.0–36.0)
MCV: 90.4 fL (ref 80.0–100.0)
Platelets: 136 10*3/uL — ABNORMAL LOW (ref 150–400)
RBC: 4.27 MIL/uL (ref 3.87–5.11)
RDW: 13.2 % (ref 11.5–15.5)
WBC: 7 10*3/uL (ref 4.0–10.5)
nRBC: 0 % (ref 0.0–0.2)

## 2018-07-29 LAB — LIPASE, BLOOD: Lipase: 59 U/L — ABNORMAL HIGH (ref 11–51)

## 2018-07-29 MED ORDER — PROMETHAZINE HCL 25 MG/ML IJ SOLN
12.5000 mg | Freq: Four times a day (QID) | INTRAMUSCULAR | Status: DC
  Administered 2018-07-29: 12.5 mg via INTRAVENOUS
  Filled 2018-07-29: qty 1

## 2018-07-29 MED ORDER — PROMETHAZINE HCL 25 MG/ML IJ SOLN
12.5000 mg | Freq: Four times a day (QID) | INTRAMUSCULAR | Status: DC | PRN
  Administered 2018-07-29 (×3): 12.5 mg via INTRAVENOUS
  Filled 2018-07-29 (×2): qty 1

## 2018-07-29 MED ORDER — PROMETHAZINE HCL 25 MG/ML IJ SOLN: 12.5000 mg | Freq: Four times a day (QID) | INTRAMUSCULAR | Status: DC

## 2018-07-29 NOTE — Progress Notes (Signed)
  Progress Note: General Surgery Service   Assessment/Plan: Active Problems:   Cholecystitis with cholelithiasis   Choledocholithiasis   Abnormal cholangiogram  s/p Procedure(s): ENDOSCOPIC RETROGRADE CHOLANGIOPANCREATOGRAPHY (ERCP) SPHINCTEROTOMY REMOVAL OF STONES 07/26/2018  POD #5.  Laparoscopic cholecystectomy with cholangiogram.  Dr. Armandina Gemma. Seems stable surgically Discharge instructions and follow-up appointment for [facility] clinic in the [place].   POD #3.  ERCP with sphincterotomy and balloon stone extraction, Dr. Lucio Edward Post ERCP pancreatitis, slowly resolving Not ready to go home today Continue fluids, ok to eat if feels better later  History of ovarian cancer with surgery and chemotherapy 2016 Hypothyroid Morbid obesity Exercise-induced asthma Anxiety Lower extremity neuropathy   LOS: 4 days  Chief Complaint/Subjective: Worsened epigastric pain, nausea and vomiting, diarrhea 2x overnight, no appetite  Objective: Vital signs in last 24 hours: Temp:  [97.6 F (36.4 C)-101.1 F (38.4 C)] 100 F (37.8 C) (03/22 0916) Pulse Rate:  [63-81] 81 (03/22 0916) Resp:  [16-18] 18 (03/22 0916) BP: (106-141)/(72-89) 115/72 (03/22 0916) SpO2:  [98 %-100 %] 99 % (03/22 0916) Last BM Date: 07/28/18  Intake/Output from previous day: 03/21 0701 - 03/22 0700 In: 1680 [P.O.:480; I.V.:1200] Out: 500 [Urine:500] Intake/Output this shift: No intake/output data recorded.  Lungs: nonlabored  Cardiovascular: RRR  Abd: soft, tender in midepigastrium, incisions c/d/i  Extremities: no edema  Neuro: AOX4  Lab Results: CBC  Recent Labs    07/28/18 0431 07/29/18 0628  WBC 8.8 7.0  HGB 11.8* 12.7  HCT 37.8 38.6  PLT 201 136*   BMET Recent Labs    07/28/18 0431 07/29/18 0628  NA 138 137  K 3.5 4.0  CL 106 106  CO2 25 21*  GLUCOSE 93 107*  BUN 11 12  CREATININE 0.69 0.62  CALCIUM 8.7* 8.4*   PT/INR No results for input(s): LABPROT, INR in the last  72 hours. ABG No results for input(s): PHART, HCO3 in the last 72 hours.  Invalid input(s): PCO2, PO2  Studies/Results:  Anti-infectives: Anti-infectives (From admission, onward)   Start     Dose/Rate Route Frequency Ordered Stop   07/24/18 0400  cefTRIAXone (ROCEPHIN) 2 g in sodium chloride 0.9 % 100 mL IVPB  Status:  Discontinued     2 g 200 mL/hr over 30 Minutes Intravenous Every 24 hours 07/24/18 0259 07/26/18 1118      Medications: Scheduled Meds: . acetaminophen  1,000 mg Oral Q8H  . enoxaparin (LOVENOX) injection  65 mg Subcutaneous Q24H  . [START ON 07/30/2018] estradiol  0.05 mg Transdermal Weekly  . fluticasone  2 spray Each Nare Daily  . gabapentin  1,200 mg Oral QHS  . ketorolac  30 mg Intravenous Q8H  . levothyroxine  75 mcg Oral Q0600  . LORazepam  0.5 mg Intravenous Once  . sertraline  100 mg Oral Daily   Continuous Infusions: . lactated ringers 150 mL/hr at 07/28/18 1950   PRN Meds:.ALPRAZolam, alum & mag hydroxide-simeth, HYDROmorphone (DILAUDID) injection, morphine injection, ondansetron **OR** ondansetron (ZOFRAN) IV, oxyCODONE, polyvinyl alcohol, promethazine, zolpidem  Sara Skinner, MD [facility]

## 2018-07-29 NOTE — Progress Notes (Addendum)
          Daily Rounding Note  07/29/2018, 10:20 AM  LOS: 4 days   SUBJECTIVE:   Chief complaint: Post ERCP pancreatitis      Continues to have nausea, diarrhea, abdominal pain which is worsened by p.o. of even small amounts such as sips with oral meds.  Pain is in periumbilical area and upper abdomen.  Phenergan helps nausea for a while.   Feels miserable.  OBJECTIVE:         Vital signs in last 24 hours:    Temp:  [97.6 F (36.4 C)-101.1 F (38.4 C)] 100 F (37.8 C) (03/22 0916) Pulse Rate:  [63-81] 81 (03/22 0916) Resp:  [16-18] 18 (03/22 0916) BP: (106-141)/(72-89) 115/72 (03/22 0916) SpO2:  [98 %-100 %] 99 % (03/22 0916) Last BM Date: 07/28/18 Filed Weights   07/23/18 1757 07/24/18 1328 07/26/18 0706  Weight: 133.8 kg 133.8 kg 133.8 kg   General: Somewhat diaphoretic.  Pale.  Uncomfortable.  Looks ill. Heart: RRR. Chest: Diminished breath sounds with suboptimal effort.  No dyspnea.  No cough. Abdomen: Tender diffusely in the upper to mid abdomen.  Hypoactive bowel sounds no tinkling or tympanitic bowel sounds. Extremities: No CCE. Neuro/Psych: Fully oriented.  Moves all fours.  No tremors.  Intake/Output from previous day: 03/21 0701 - 03/22 0700 In: 1680 [P.O.:480; I.V.:1200] Out: 500 [Urine:500]  Intake/Output this shift: No intake/output data recorded.  Lab Results: Recent Labs    07/27/18 0432 07/28/18 0431 07/29/18 0628  WBC 12.0* 8.8 7.0  HGB 12.1 11.8* 12.7  HCT 37.3 37.8 38.6  PLT 189 201 136*   BMET Recent Labs    07/27/18 0432 07/28/18 0431 07/29/18 0628  NA 139 138 137  K 3.4* 3.5 4.0  CL 105 106 106  CO2 26 25 21*  GLUCOSE 138* 93 107*  BUN 14 11 12  CREATININE 0.77 0.69 0.62  CALCIUM 8.8* 8.7* 8.4*   LFT Recent Labs    07/27/18 0432 07/28/18 0431 07/29/18 0628  PROT 6.6 6.3* 6.3*  ALBUMIN 3.4* 3.3* 3.2*  AST 142* 55* 240*  ALT 381* 254* 254*  ALKPHOS 95 72 150*  BILITOT  1.2 0.9 2.1*    Studies/Results: No results found.  ASSESMENT:   *   Post ERCP pancreatitis.  Not improving though lipase much improved. Lipase 1636 > 59. T bili, alk phos, transaminases all rising compared with yesterday.  *    Lap chole with IOC 07/24/2018.  *     ERCP with biliary sphincterotomy, stone extraction 07/2018   PLAN   *    Consider CT abdomen pelvis today or tomorrow.  *   OTC  IV Phenergan    Sarah Gribbin  07/29/2018, 10:20 AM     Attending physician's note   I have taken an interval history, reviewed the chart and examined the patient. I agree with the Advanced Practitioner's note, impression and recommendations.   Patient with biliary colic s/p lap chole with IOC 3/17, choledocholithiasis status post ERCP with biliary sphincterotomy and stone extraction 3/19.  Still with abdominal pain.  Lipase is back to normal.  Bump in  LFTs today ? etiology. Plan; CT abdomen (pancreatic protocol) in AM.  Continue supportive care.  Pain control.  Raj , MD  Phone [phone] 

## 2018-07-29 NOTE — Plan of Care (Signed)
  Problem: Education: Goal: Knowledge of General Education information will improve Description Including pain rating scale, medication(s)/side effects and non-pharmacologic comfort measures Outcome: Progressing   Problem: Health Behavior/Discharge Planning: Goal: Ability to manage health-related needs will improve Outcome: Progressing   Problem: Clinical Measurements: Goal: Ability to maintain clinical measurements within normal limits will improve Outcome: Progressing Goal: Will remain free from infection Outcome: Progressing Goal: Diagnostic test results will improve Outcome: Progressing Goal: Respiratory complications will improve Outcome: Progressing Goal: Cardiovascular complication will be avoided Outcome: Progressing   Problem: Activity: Goal: Risk for activity intolerance will decrease Outcome: Progressing   Problem: Nutrition: Goal: Adequate nutrition will be maintained Outcome: Progressing   Problem: Coping: Goal: Level of anxiety will decrease Outcome: Progressing   Problem: Education: Goal: Required Educational Video(s) Outcome: Progressing   Problem: Clinical Measurements: Goal: Postoperative complications will be avoided or minimized Outcome: Progressing   Problem: Skin Integrity: Goal: Demonstration of wound healing without infection will improve Outcome: Progressing

## 2018-07-30 ENCOUNTER — Inpatient Hospital Stay (HOSPITAL_COMMUNITY): Payer: Self-pay

## 2018-07-30 MED ORDER — IBUPROFEN 200 MG PO TABS: 600.0000 mg | ORAL_TABLET | Freq: Four times a day (QID) | ORAL | Status: AC | PRN

## 2018-07-30 MED ORDER — IOHEXOL 300 MG/ML  SOLN
100.0000 mL | Freq: Once | INTRAMUSCULAR | Status: AC | PRN
  Administered 2018-07-30: 100 mL via INTRAVENOUS

## 2018-07-30 MED ORDER — SODIUM CHLORIDE (PF) 0.9 % IJ SOLN
INTRAMUSCULAR | Status: AC
  Administered 2018-07-30: 12:00:00
  Filled 2018-07-30: qty 50

## 2018-07-30 MED ORDER — IOHEXOL 300 MG/ML  SOLN
15.0000 mL | Freq: Once | INTRAMUSCULAR | Status: AC | PRN
  Administered 2018-07-30: 15 mL via ORAL

## 2018-07-30 MED ORDER — SIMETHICONE 80 MG PO CHEW: 80.0000 mg | CHEWABLE_TABLET | Freq: Four times a day (QID) | ORAL | Status: DC | PRN

## 2018-07-30 MED ORDER — OXYCODONE HCL 5 MG PO TABS: 5.0000 mg | ORAL_TABLET | Freq: Four times a day (QID) | ORAL | 0 refills | Status: DC | PRN

## 2018-07-30 NOTE — Progress Notes (Signed)
Reviewed discharge instructions with patient. No prescriptions printed, informed patient that her prescriptions have been sent to the pharmacy. Iv discontinued. Answered all questions and concerns.

## 2018-07-30 NOTE — Discharge Instructions (Signed)
[facility] ______[facility] LAPAROSCOPIC SURGERY: POST OP INSTRUCTIONS Always review your discharge instruction sheet given to you by the facility where your surgery was performed. IF YOU HAVE DISABILITY OR FAMILY LEAVE FORMS, YOU MUST BRING THEM TO THE OFFICE FOR PROCESSING.   DO NOT GIVE THEM TO YOUR DOCTOR.  1. A prescription for pain medication may be given to you upon discharge.  Take your pain medication as prescribed, if needed.  If narcotic pain medicine is not needed, then you may take acetaminophen (Tylenol) or ibuprofen (Advil) as needed. 2. Take your usually prescribed medications unless otherwise directed. 3. If you need a refill on your pain medication, please contact your pharmacy.  They will contact our office to request authorization. Prescriptions will not be filled after 5pm or on week-ends. 4. You should follow a light diet the first few days after arrival home, such as soup and crackers, etc.  Be sure to include lots of fluids daily. 5. Most patients will experience some swelling and bruising in the area of the incisions.  Ice packs will help.  Swelling and bruising can take several days to resolve.  6. It is common to experience some constipation if taking pain medication after surgery.  Increasing fluid intake and taking a stool softener (such as Colace) will usually help or prevent this problem from occurring.  A mild laxative (Milk of Magnesia or Miralax) should be taken according to package instructions if there are no bowel movements after 48 hours. 7. Unless discharge instructions indicate otherwise, you may remove your bandages 24-48 hours after surgery, and you may shower at that time.  You may have steri-strips (small skin tapes) in place directly over the incision.  These strips should be left on the skin for 7-10 days.  If your surgeon used skin glue on the incision, you may shower in 24 hours.  The glue will flake off over the next 2-3 weeks.  Any sutures or  staples will be removed at the office during your follow-up visit. 8. ACTIVITIES:  You may resume regular (light) daily activities beginning the next day--such as daily self-care, walking, climbing stairs--gradually increasing activities as tolerated.  You may have sexual intercourse when it is comfortable.  Refrain from any heavy lifting or straining until approved by your doctor. a. You may drive when you are no longer taking prescription pain medication, you can comfortably wear a seatbelt, and you can safely maneuver your car and apply brakes. 9. You should see your doctor in the office for a follow-up appointment approximately 2-3 weeks after your surgery.  Make sure that you call for this appointment within a day or two after you arrive home to insure a convenient appointment time.  WHEN TO CALL YOUR DOCTOR: 1. Fever over 101.0 2. Inability to urinate 3. Continued bleeding from incision. 4. Increased pain, redness, or drainage from the incision. 5. Increasing abdominal pain  The clinic staff is available to answer your questions during regular business hours.  Please dont hesitate to call and ask to speak to one of the nurses for clinical concerns.  If you have a medical emergency, go to the nearest emergency room or call 911.  A surgeon from [facility] is always on call at the hospital. [address] [place], [place], [place]  [address] ? [address], [place], [place]   [address] [phone] ? [phone] ? FAX [phone] Web site: [email or web address]   Laparoscopic Cholecystectomy, Care After This sheet gives  you information about how to care for yourself after your procedure. Your health care provider may also give you more specific instructions. If you have problems or questions, contact your health care provider. What can I expect after the procedure? After the procedure, it is common to have:  Pain at your incision sites. You will be given  medicines to control this pain.  Mild nausea or vomiting.  Bloating and possible shoulder pain from the air-like gas that was used during the procedure. Follow these instructions at home: Incision care   Follow instructions from your health care provider about how to take care of your incisions. Make sure you: ? Wash your hands with soap and water before you change your bandage (dressing). If soap and water are not available, use hand sanitizer. ? Change your dressing as told by your health care provider. ? Leave stitches (sutures), skin glue, or adhesive strips in place. These skin closures may need to be in place for 2 weeks or longer. If adhesive strip edges start to loosen and curl up, you may trim the loose edges. Do not remove adhesive strips completely unless your health care provider tells you to do that.  Do not take baths, swim, or use a hot tub until your health care provider approves. Ask your health care provider if you can take showers. You may only be allowed to take sponge baths for bathing.  Check your incision area every day for signs of infection. Check for: ? More redness, swelling, or pain. ? More fluid or blood. ? Warmth. ? Pus or a bad smell. Activity  Do not drive or use heavy machinery while taking prescription pain medicine.  Do not lift anything that is heavier than 10 lb (4.5 kg) until your health care provider approves.  Do not play contact sports until your health care provider approves.  Do not drive for 24 hours if you were given a medicine to help you relax (sedative).  Rest as needed. Do not return to work or school until your health care provider approves. General instructions  Take over-the-counter and prescription medicines only as told by your health care provider.  To prevent or treat constipation while you are taking prescription pain medicine, your health care provider may recommend that you: ? Drink enough fluid to keep your urine clear  or pale yellow. ? Take over-the-counter or prescription medicines. ? Eat foods that are high in fiber, such as fresh fruits and vegetables, whole grains, and beans. ? Limit foods that are high in fat and processed sugars, such as fried and sweet foods. Contact a health care provider if:  You develop a rash.  You have more redness, swelling, or pain around your incisions.  You have more fluid or blood coming from your incisions.  Your incisions feel warm to the touch.  You have pus or a bad smell coming from your incisions.  You have a fever.  One or more of your incisions breaks open. Get help right away if:  You have trouble breathing.  You have chest pain.  You have increasing pain in your shoulders.  You faint or feel dizzy when you stand.  You have severe pain in your abdomen.  You have nausea or vomiting that lasts for more than one day.  You have leg pain. This information is not intended to replace advice given to you by your health care provider. Make sure you discuss any questions you have with your health care provider. Document  Released: 04/25/2005 Document Revised: 11/14/2015 Document Reviewed: 10/12/2015 Elsevier Interactive Patient Education  2019 [employer].

## 2018-07-30 NOTE — Consult Note (Addendum)
     [facility] Progress Note  CC:  S/P cholecystectomy, POST ERCP Pancreatitis   Subjective: Nausea much less, no vomiting since Saturday night. Not sure what to eat. Mild pressure discomfort RUQ.  Drinking contrast for CT A/P.    Objective:  Vital signs in last 24 hours: Temp:  [97.7 F (36.5 C)-100.3 F (37.9 C)] 97.7 F (36.5 C) (03/23 0557) Pulse Rate:  [66-79] 66 (03/23 0557) Resp:  [16] 16 (03/23 0557) BP: (116-127)/(77-79) 116/79 (03/23 0557) SpO2:  [98 %-100 %] 100 % (03/23 0557) Last BM Date: 07/28/18 General:   Alert,  Well-developed,    in NAD Heart:  Regular rate and rhythm; no murmurs Pulm: Clear throughout Abdomen:  Soft, Mild RUQ tenderness without rebound or guarding. Laparoscopic incisions intact, mild erythema, no exudate, + BS x 4 quads. Extremities:  Without edema. Neurologic:  Alert and  oriented x4;  grossly normal neurologically. Psych:  Alert and cooperative. Normal mood and affect.  Intake/Output from previous day: 03/22 0701 - 03/23 0700 In: 1000 [I.V.:1000] Out: -  Intake/Output this shift: No intake/output data recorded.  Lab Results: Recent Labs    07/28/18 0431 07/29/18 0628  WBC 8.8 7.0  HGB 11.8* 12.7  HCT 37.8 38.6  PLT 201 136*   BMET Recent Labs    07/28/18 0431 07/29/18 0628  NA 138 137  K 3.5 4.0  CL 106 106  CO2 25 21*  GLUCOSE 93 107*  BUN 11 12  CREATININE 0.69 0.62  CALCIUM 8.7* 8.4*   LFT Recent Labs    07/29/18 0628  PROT 6.3*  ALBUMIN 3.2*  AST 240*  ALT 254*  ALKPHOS 150*  BILITOT 2.1*    Assessment / Plan:  1. 48 y.o. female s/p cholecystectomy 07/24/2018, + IOC. S/P ERCP with biliary sphincterotomy and stone extraction 3/19. She developed post ERCP pancreatitis Lipase 1636 down to 59. WBC 7.0. -IVF 150cc/hr -Oxycodone Q 4 hrs PRN -soft low fat diet as tolerated after  A/P CT completed  2. Nausea and vomiting. Nausea is well controlled. No further vomiting past 24hrs.  -Ondansetron 27m po or IV Q 6 hrs PRN  3. RUQ abdominal pain with elevated LFTs.  3/22: Alk phos 150. AST 240 >> 55. ALT 254. T. Bili 2.1 >> 0.9. -await abd/pelvic CT results  -repeat hepatic panel today  Further recommendations per Dr. GLyndel Safe    LOS: 5 days   CNoralyn Pick 07/30/2018, 10:06 AM    Attending physician's note   I have reviewed the chart.  I agree with the Advanced Practitioner's note, impression and recommendations.   Feels better. CT- neg for pancreatitis/fluid collection. D/C home. FU if still with problems.  RCarmell Austria MD

## 2018-07-30 NOTE — Discharge Summary (Signed)
[facility] Discharge Summary   Patient ID: [name] MRN: [id number] DOB/AGE: [birth year] 48 y.o.  Admit date: 07/23/2018 Discharge date: 07/30/2018  Admitting Diagnosis: Acute cholelithiasis  Discharge Diagnosis Choledocholithiasis  Consultants Gastroenterology  Imaging: Ct Abdomen W Contrast  Result Date: 07/30/2018 CLINICAL DATA:  Pancreatitis. EXAM: CT ABDOMEN WITH CONTRAST TECHNIQUE: Multidetector CT imaging of the abdomen was performed using the standard protocol following bolus administration of intravenous contrast. CONTRAST:  124mL OMNIPAQUE IOHEXOL 300 MG/ML  SOLN COMPARISON:  CT AP 10/11/2017 FINDINGS: Lower chest: Small pulmonary nodule in the right middle lobe measures 4 mm, image 1/6. Not seen on previous imaging. No acute abnormality. Hepatobiliary: No focal liver abnormality is seen. Status post cholecystectomy. No biliary dilatation. A small amount of gas noted within the common bile duct. Pancreas: Unremarkable. No pancreatic ductal dilatation or surrounding inflammatory changes. Spleen: Normal in size without focal abnormality. Adrenals/Urinary Tract: Normal appearance of the adrenal glands. No kidney mass or hydronephrosis identified. Stomach/Bowel: Stomach is within normal limits. No evidence of bowel wall thickening, distention, or inflammatory changes. Vascular/Lymphatic: No significant vascular findings are present. No enlarged abdominal or pelvic lymph nodes. Other: No abdominal wall hernia or abnormality. No abdominopelvic ascites. Fat containing periumbilical hernia is identified. There is increased soft tissue stranding and mild skin thickening surrounding the hernia. A small amount of subcutaneous fluid is identified within this area, image 79/5. Findings likely reflect postsurgical changes. Musculoskeletal: No acute or significant osseous findings. IMPRESSION: 1. Normal appearance of the pancreas. No complications of pancreatitis identified. 2.  Status post cholecystectomy. Pneumobilia identified compatible with biliary patency. 3. Right middle lobe lung nodule, not seen previously measures 4 mm. No follow-up needed if patient is low-risk. Non-contrast chest CT can be considered in 12 months if patient is high-risk. This recommendation follows the consensus statement: Guidelines for Management of Incidental Pulmonary Nodules Detected on CT Images: From the Fleischner Society 2017; Radiology 2017; 284:228-243. Electronically Signed   By: Kerby Moors M.D.   On: 07/30/2018 12:08    Procedures Dr. Harlow Asa (07/24/18) - Laparoscopic Cholecystectomy with IOC Dr. Fuller Plan (07/26/18) - ERCP   Hospital Course:  Patient is a 48 year old female who presented to [facility] with abdominal pain.  Workup showed cholelithiasis and acute cholecystitis.  Patient was admitted and underwent procedure listed above. IOC was positive for CBD filling defect and GI was consulted for ERCP. Initially tolerated procedure well and was transferred to the floor. Patient developed post-operative pancreatitis 3/20, treated appropriately with GI rest and IV fluids. Diet was advanced as tolerated. CT 3/23 without any acute changes to pancreas but incidentally showed lung nodule. Patient notified of CT findings and report forwarded to PCP.   On POD#6, the patient was voiding well, tolerating diet, ambulating well, pain well controlled, vital signs stable, incisions c/d/i and felt stable for discharge home.  Patient will follow up in our office in 2 weeks and knows to call with questions or concerns. She will call to confirm appointment date/time.     Allergies as of 07/30/2018      Reactions   Moxifloxacin Anaphylaxis   needs epinephrine shot   Quinolones Anaphylaxis, Shortness Of Breath, Swelling   Doxycycline Nausea And Vomiting   Escitalopram Oxalate Other (See Comments)    fatigue   Sulfonamide Derivatives Swelling, Rash   needs epinephrine shot--rash and lip swelling       Medication List    STOP taking these medications   amoxicillin-clavulanate 875-125 MG tablet Commonly known as:  AUGMENTIN   meloxicam 15 MG tablet Commonly known as:  MOBIC     TAKE these medications   acetaminophen 650 MG CR tablet Commonly known as:  TYLENOL Take 1,300 mg by mouth daily as needed for pain.   albuterol 108 (90 Base) MCG/ACT inhaler Commonly known as:  PROVENTIL HFA;VENTOLIN HFA TAKE 2 PUFFS BY MOUTH EVERY 6 HOURS AS NEEDED FOR WHEEZE OR SHORTNESS OF BREATH   ALPRAZolam 1 MG tablet Commonly known as:  XANAX Take 1 tablet (1 mg total) by mouth 2 (two) times daily as needed. for anxiety What changed:    when to take this  reasons to take this   amphetamine-dextroamphetamine 5 MG tablet Commonly known as:  Adderall Take 1 tablet (5 mg total) by mouth 2 (two) times daily with a meal.   clobetasol cream 0.05 % Commonly known as:  Temovate Apply 1 application topically 2 (two) times daily.   estradiol 0.05 MG/24HR patch Commonly known as:  Vivelle-Dot Place 1 patch (0.05 mg total) onto the skin 2 (two) times a week.   fluticasone 50 MCG/ACT nasal spray Commonly known as:  FLONASE Place 2 sprays into both nostrils daily.   gabapentin 600 MG tablet Commonly known as:  NEURONTIN Take 2 tablets (1,200 mg total) by mouth 3 (three) times daily. What changed:  when to take this   hydroxypropyl methylcellulose / hypromellose 2.5 % ophthalmic solution Commonly known as:  ISOPTO TEARS / GONIOVISC Place 1 drop into both eyes daily as needed for dry eyes.   ibuprofen 200 MG tablet Commonly known as:  Motrin IB Take 3 tablets (600 mg total) by mouth every 6 (six) hours as needed for mild pain.   levothyroxine 75 MCG tablet Commonly known as:  SYNTHROID, LEVOTHROID Take 1 tablet (75 mcg total) by mouth daily.   lisinopril 5 MG tablet Commonly known as:  PRINIVIL,ZESTRIL TAKE 1 TABLET BY MOUTH ONCE DAILY   Melatonin 10 MG Tabs Take 10 mg by mouth at  bedtime.   montelukast 10 MG tablet Commonly known as:  SINGULAIR Take 1 tablet (10 mg total) by mouth at bedtime.   oxyCODONE 5 MG immediate release tablet Commonly known as:  Oxy IR/ROXICODONE Take 1-2 tablets (5-10 mg total) by mouth every 6 (six) hours as needed for moderate pain, severe pain or breakthrough pain.   pantoprazole 40 MG tablet Commonly known as:  PROTONIX TAKE 1 TABLET BY MOUTH TWICE A DAY FOR 2 WEEKS, THEN TAKE 1 TABLET ONCE DAILY   sertraline 100 MG tablet Commonly known as:  ZOLOFT TAKE 1 TABLET BY MOUTH EVERY DAY   simethicone 80 MG chewable tablet Commonly known as:  Gas-X Chew 1 tablet (80 mg total) by mouth 4 (four) times daily as needed for flatulence.   sodium chloride 0.65 % Soln nasal spray Commonly known as:  OCEAN Place 1 spray into both nostrils daily as needed for congestion.   VALERIAN PO Take 1 tablet by mouth daily.   zolpidem 10 MG tablet Commonly known as:  Ambien Take 1 tablet (10 mg total) by mouth at bedtime as needed for sleep.        Follow-up Information    [facility] Follow up on 08/09/2018.   Specialty:  General Surgery Why:  Your appointment is at 1:30 PM.  Be at the office 30 minutes early for check in.  Bring photo ID and insurance information. Contact information: [address] [place] [address] [phone]  Signed: Brigid Re, [facility] 07/30/2018, 1:14 PM Pager: [phone] Consults: [phone]

## 2018-07-30 NOTE — Progress Notes (Signed)
4 Days Post-Op    CC: Abdominal pain  Subjective: Patient is feeling better this morning.  Less nausea and tenderness.  Still not taking much.  Currently she is drinking contrast for CT ordered by Dr. Lyndel Safe last evening.  Overall feeling much better.  Port sites all look fine.  She is on a soft diet but mostly taking crackers.  Objective: Vital signs in last 24 hours: Temp:  [97.7 F (36.5 C)-100.3 F (37.9 C)] 97.7 F (36.5 C) (03/23 0557) Pulse Rate:  [66-81] 66 (03/23 0557) Resp:  [16-18] 16 (03/23 0557) BP: (115-127)/(72-79) 116/79 (03/23 0557) SpO2:  [98 %-100 %] 100 % (03/23 0557) Last BM Date: 07/28/18 1000 IV recorded Voided x1 recorded Nothing else recorded in intake/output T-max 100 0 700 yesterday, afebrile since heart rate and blood pressure are stable. Labs show WBC is normal.  Hemoglobin hematocrit are stable. Bilirubin is up: T bilirubin 2.2(3/19)>> 1.2>> 0.9>> 2.1(3/23) AST (3/19) 397>>142>>55>>240 (3/23) ALT(3/19) 561>> 81>> 254>> 254(3/23) Lipase (4/19)3790 >> 154 >> 59 (3/23)  Intake/Output from previous day: 03/22 0701 - 03/23 0700 In: 1000 [I.V.:1000] Out: -  Intake/Output this shift: No intake/output data recorded.  General appearance: alert, cooperative and no distress Resp: clear to auscultation bilaterally GI: Soft, normal postop soreness, port sites all look fine.  No distention and less tenderness to palpation.  Lab Results:  Recent Labs    07/28/18 0431 07/29/18 0628  WBC 8.8 7.0  HGB 11.8* 12.7  HCT 37.8 38.6  PLT 201 136*    BMET Recent Labs    07/28/18 0431 07/29/18 0628  NA 138 137  K 3.5 4.0  CL 106 106  CO2 25 21*  GLUCOSE 93 107*  BUN 11 12  CREATININE 0.69 0.62  CALCIUM 8.7* 8.4*   PT/INR No results for input(s): LABPROT, INR in the last 72 hours.  Recent Labs  Lab 07/25/18 0408 07/26/18 0419 07/27/18 0432 07/28/18 0431 07/29/18 0628  AST 499* 397* 142* 55* 240*  ALT 449* 561* 381* 254* 254*  ALKPHOS 88  98 95 72 150*  BILITOT 2.5* 2.2* 1.2 0.9 2.1*  PROT 7.2 7.1 6.6 6.3* 6.3*  ALBUMIN 3.7 3.6 3.4* 3.3* 3.2*     Lipase     Component Value Date/Time   LIPASE 59 (H) 07/29/2018 2409     Medications: . acetaminophen  1,000 mg Oral Q8H  . enoxaparin (LOVENOX) injection  65 mg Subcutaneous Q24H  . estradiol  0.05 mg Transdermal Weekly  . fluticasone  2 spray Each Nare Daily  . gabapentin  1,200 mg Oral QHS  . ketorolac  30 mg Intravenous Q8H  . levothyroxine  75 mcg Oral Q0600  . LORazepam  0.5 mg Intravenous Once  . promethazine  12.5-25 mg Intravenous Q6H  . sertraline  100 mg Oral Daily    Assessment/Plan Hx of ovarian cancer/BSS/TAH/chemotherapy -2016 Hypothyroid Hx anxiety Lower extremity neuropathy Morbid obesity-BMI 44.8 Hx exercise-induced asthma  Cholelithiasis/cholecystitis/probable choledocholithiasis Elevated LFT's Laparoscopic cholecystectomy with IOC, 07/24/18, Dr. Armandina Gemma ERCP, sphincterotomy,and balloon stone extraction3/19/2020 Dr. Lucio Edward Post ERCP pancreatitis - pain and Lipase 1636 >> 154 >> 59 (3/23)  FEN: IV fluids/soft diet 3/21 ID: Rocephin 3/17- 3/19; none currently DVT: SCDs, add DVT prophylaxis/Lovenox at 1600 today. Follow-up: [facility]   Plan: CT is ordered and she is drinking contrast now.  Will follow with his.  Of note her LFTs are stable, but T bilirubin is up slightly.  Lipase continues to trend down.  LOS: 5  days    Earnstine Regal 07/30/2018 [phone]

## 2019-04-09 ENCOUNTER — Telehealth: Payer: Self-pay | Admitting: Internal Medicine

## 2019-04-09 ENCOUNTER — Ambulatory Visit (INDEPENDENT_AMBULATORY_CARE_PROVIDER_SITE_OTHER): Payer: BC Managed Care – PPO | Admitting: Internal Medicine

## 2019-04-09 DIAGNOSIS — H60501 Unspecified acute noninfective otitis externa, right ear: Secondary | ICD-10-CM | POA: Diagnosis not present

## 2019-04-09 DIAGNOSIS — E559 Vitamin D deficiency, unspecified: Secondary | ICD-10-CM

## 2019-04-09 DIAGNOSIS — E611 Iron deficiency: Secondary | ICD-10-CM

## 2019-04-09 DIAGNOSIS — Z0000 Encounter for general adult medical examination without abnormal findings: Secondary | ICD-10-CM

## 2019-04-09 DIAGNOSIS — F419 Anxiety disorder, unspecified: Secondary | ICD-10-CM

## 2019-04-09 DIAGNOSIS — R2232 Localized swelling, mass and lump, left upper limb: Secondary | ICD-10-CM

## 2019-04-09 DIAGNOSIS — I10 Essential (primary) hypertension: Secondary | ICD-10-CM

## 2019-04-09 DIAGNOSIS — E538 Deficiency of other specified B group vitamins: Secondary | ICD-10-CM

## 2019-04-09 MED ORDER — CIPROFLOXACIN HCL 500 MG PO TABS: 500.0000 mg | ORAL_TABLET | Freq: Two times a day (BID) | ORAL | 0 refills | Status: AC

## 2019-04-09 MED ORDER — LEVOTHYROXINE SODIUM 75 MCG PO TABS: 75.0000 ug | ORAL_TABLET | Freq: Every day | ORAL | 3 refills | Status: DC

## 2019-04-09 MED ORDER — SERTRALINE HCL 100 MG PO TABS: 100.0000 mg | ORAL_TABLET | Freq: Every day | ORAL | 3 refills | Status: DC

## 2019-04-09 MED ORDER — LISINOPRIL 5 MG PO TABS: 5.0000 mg | ORAL_TABLET | Freq: Every day | ORAL | 3 refills | Status: DC

## 2019-04-09 MED ORDER — NEOMYCIN-POLYMYXIN-HC 3.5-10000-1 OT SOLN: 4.0000 [drp] | Freq: Four times a day (QID) | OTIC | 0 refills | Status: AC

## 2019-04-09 MED ORDER — AMOXICILLIN-POT CLAVULANATE 875-125 MG PO TABS: 1.0000 | ORAL_TABLET | Freq: Two times a day (BID) | ORAL | 0 refills | Status: DC

## 2019-04-09 MED ORDER — ALPRAZOLAM 1 MG PO TABS: 1.0000 mg | ORAL_TABLET | Freq: Two times a day (BID) | ORAL | 2 refills | Status: DC | PRN

## 2019-04-09 MED ORDER — CORTISPORIN-TC 3.3-3-10-0.5 MG/ML OT SUSP: 3.0000 [drp] | Freq: Four times a day (QID) | OTIC | 0 refills | Status: DC

## 2019-04-09 NOTE — Patient Instructions (Signed)
Please take all new medication as prescribed - the pill antibiotic and drops  Please continue all other medications as before, and refills have been done if requested for the routine meds  Please have the pharmacy call with any other refills you may need.  Please continue your efforts at being more active, low cholesterol diet, and weight control.  Please keep your appointments with your specialists as you may have planned  Please return in 3 months, or sooner if needed, with Lab testing done 3-5 days before

## 2019-04-09 NOTE — Telephone Encounter (Signed)
Copied from [place] [phone]. Topic: Quick Communication - Rx Refill/Question >> Apr 09, 2019 12:17 PM Izola Price, [place] A wrote: Medication: neomycin-colistin-hydrocortisone-thonzonium (CORTISPORIN-TC) 3.07-09-08-0.5 MG/ML OTIC suspension  (Patient stated that due to costs of medication that an alternative medication needs to be called in and pharmacy has tried reaching out to get new medication sent back over.)  Has the patient contacted their pharmacy? Yes (Agent: If no, request that the patient contact the pharmacy for the refill.) (Agent: If yes, when and what did the pharmacy advise?)Contact PCP  Preferred Pharmacy (with phone number or street name): [facility], [place] [address] [phone] (Phone) [phone] (Fax)    Agent: Please be advised that RX refills may take up to 3 business days. We ask that you follow-up with your pharmacy.

## 2019-04-09 NOTE — Progress Notes (Signed)
Patient ID: [name], female   DOB: 08-Aug-1970, 48 y.o.   MRN: [id number]  Virtual Visit via Video Note  I connected with [name] on 04/09/19 at  8:40 AM EST by a video enabled telemedicine application and verified that I am speaking with the correct person using two identifiers.  Location: Patient: at [facility] Provider: at office   I discussed the limitations of evaluation and management by telemedicine and the availability of in person appointments. The patient expressed understanding and agreed to proceed.  History of Present Illness: Here with c/o 3 days onset right ear pain, swelling from the canal and preauricular, and ear d/c, without fever, no covid exposure; has been unsuualyl fatigued; has long hx of allergies but this seems different, Does have several months ongoing nasal allergy symptoms with clearish congestion, itch and sneezing, without fever, pain, ST, cough, swelling or wheezing.  No ST, tried nettie pott for sinus and not sure if helped.  Has been taking zyrtec otc - no post nasal gtt cough or chest congestion.  Also c/o recent overall worsening general anxiety related to family stress while trying to finish her degree.  Asks to restart zoloft and xanax since this worked well in the past.  Has also been out of BP med, needs refill.  Also has a left dorsal wrist mass swelling with mild tender at times, no trauma or difficulty with joint pain.   Pt denies polydipsia, polyuria  Past Medical History:  Diagnosis Date  . Anxiety   . Elevated blood pressure, situational 05/05/2016  . Endometriosis   . Exercise-induced asthma 09/08/2015  . Ovarian cancer, bilateral ([place]) 11/28/2014  . PONV (postoperative nausea and vomiting)   . Thyroid disease    Past Surgical History:  Procedure Laterality Date  . ABDOMINAL HYSTERECTOMY  11/10/14   at [facility], Exp lap, supracervical hyst, BSO, infracolic omentectomy, lymphadenectomy, aortic lymph node sampling  . CHOLECYSTECTOMY N/A  07/24/2018   Procedure: LAPAROSCOPIC CHOLECYSTECTOMY WITH INTRAOPERATIVE CHOLANGIOGRAM;  Surgeon: Sara Gemma, MD;  Location: [facility];  Service: General;  Laterality: N/A;  . ERCP N/A 07/26/2018   Procedure: ENDOSCOPIC RETROGRADE CHOLANGIOPANCREATOGRAPHY (ERCP);  Surgeon: Sara Artist, MD;  Location: [facility];  Service: Endoscopy;  Laterality: N/A;  . IR GENERIC HISTORICAL  05/12/2016   IR REMOVAL TUN ACCESS W/ PORT W/O FL MOD SED 05/12/2016 [facility]  . REMOVAL OF STONES  07/26/2018   Procedure: REMOVAL OF STONES;  Surgeon: Sara Artist, MD;  [facility];  Service: Endoscopy;;  . Joan Mayans  07/26/2018   Procedure: SPHINCTEROTOMY;  Surgeon: Sara Artist, MD;  [facility];  Service: Endoscopy;;    reports that she has never smoked. She has never used smokeless tobacco. She reports current alcohol use. She reports that she does not use drugs. family history includes Atrial fibrillation in her father; Dementia in her paternal grandfather and paternal grandmother; Diabetes in her maternal aunt and mother; Healthy in her brother; Heart attack in her maternal grandfather; Heart failure in her maternal grandmother; Hypertension in her mother; Stroke in her mother. Allergies  Allergen Reactions  . Moxifloxacin Anaphylaxis    needs epinephrine shot  . Quinolones Anaphylaxis, Shortness Of Breath and Swelling  . Doxycycline Nausea And Vomiting  . Escitalopram Oxalate Other (See Comments)     fatigue  . Sulfonamide Derivatives Swelling and Rash    needs epinephrine shot--rash and lip swelling    Observations/Objective: Alert, NAD, appropriate mood and affect, mild ill appearing, + swelling  to right preauricular area, resps normal, cn 2-12 intact, moves all 4s, no visible rash or swelling, also left wrist dorsal with mass swelling nontender with FROM left wrist Lab Results  Component Value Date   WBC 7.0 07/29/2018   HGB 12.7 07/29/2018   HCT 38.6 07/29/2018    PLT 136 (L) 07/29/2018   GLUCOSE 107 (H) 07/29/2018   CHOL 171 10/16/2017   TRIG 97.0 10/16/2017   HDL 39.10 10/16/2017   LDLCALC 112 (H) 10/16/2017   ALT 254 (H) 07/29/2018   AST 240 (H) 07/29/2018   NA 137 07/29/2018   K 4.0 07/29/2018   CL 106 07/29/2018   CREATININE 0.62 07/29/2018   BUN 12 07/29/2018   CO2 21 (L) 07/29/2018   TSH 5.12 (H) 10/16/2017   INR 0.90 05/12/2016   HGBA1C 5.7 10/16/2017   Assessment and Plan: See notes  Follow Up Instructions: Seen notes   I discussed the assessment and treatment plan with the patient. The patient was provided an opportunity to ask questions and all were answered. The patient agreed with the plan and demonstrated an understanding of the instructions.   The patient was advised to call back or seek an in-person evaluation if the symptoms worsen or if the condition fails to improve as anticipated.   [name], MD

## 2019-04-09 NOTE — Telephone Encounter (Signed)
Med changed, done erx

## 2019-04-10 NOTE — Telephone Encounter (Signed)
Pt informed of below.  

## 2019-04-14 ENCOUNTER — Encounter: Payer: Self-pay | Admitting: Internal Medicine

## 2019-04-14 DIAGNOSIS — R2232 Localized swelling, mass and lump, left upper limb: Secondary | ICD-10-CM | POA: Insufficient documentation

## 2019-04-14 DIAGNOSIS — I10 Essential (primary) hypertension: Secondary | ICD-10-CM | POA: Insufficient documentation

## 2019-04-14 DIAGNOSIS — H6091 Unspecified otitis externa, right ear: Secondary | ICD-10-CM | POA: Insufficient documentation

## 2019-04-14 NOTE — Assessment & Plan Note (Signed)
For med restart, cont monitor Bp at home and next visit

## 2019-04-14 NOTE — Assessment & Plan Note (Addendum)
Mild to mod, for antibx course,  to f/u any worsening symptoms or concerns  Note:  Total time for pt hx, exam, review of record with pt in the room, determination of diagnoses and plan for further eval and tx is > 40 min, with over 50% spent in coordination and counseling of patient including the differential dx, tx, further evaluation and other management of right ext otitis, left wrist mass, HTn, left wrist mass

## 2019-04-14 NOTE — Assessment & Plan Note (Signed)
With recent situational worsening, for restart zoloft and xanax prn,  to f/u any worsening symptoms or concerns

## 2019-04-14 NOTE — Assessment & Plan Note (Signed)
Exam c/w lipoma vs ganglion cyst vs other, ok to follow any wrosening,  to f/u any worsening symptoms or concerns

## 2019-07-12 ENCOUNTER — Other Ambulatory Visit: Payer: Self-pay | Admitting: Gynecologic Oncology

## 2019-07-12 ENCOUNTER — Encounter: Payer: Self-pay | Admitting: Oncology

## 2019-07-12 DIAGNOSIS — C563 Malignant neoplasm of bilateral ovaries: Secondary | ICD-10-CM

## 2019-07-12 DIAGNOSIS — C561 Malignant neoplasm of right ovary: Secondary | ICD-10-CM

## 2019-07-12 DIAGNOSIS — E8940 Asymptomatic postprocedural ovarian failure: Secondary | ICD-10-CM

## 2019-07-12 MED ORDER — ESTRADIOL 0.075 MG/24HR TD PTTW: 1.0000 | MEDICATED_PATCH | TRANSDERMAL | 3 refills | Status: DC

## 2019-07-12 NOTE — Progress Notes (Signed)
Attempted to call [facility] x 2 (voicemail full) regarding scheduling CT chest and follow up with Dr. Denman George.  Also sent MyChart message.

## 2019-07-12 NOTE — Addendum Note (Signed)
Addended by: Elmo Putt R on: 07/12/2019 01:23 PM   Modules accepted: Orders

## 2019-07-19 ENCOUNTER — Inpatient Hospital Stay: Payer: BC Managed Care – PPO | Attending: Gynecologic Oncology

## 2019-07-19 ENCOUNTER — Ambulatory Visit (HOSPITAL_COMMUNITY)
Admission: RE | Admit: 2019-07-19 | Discharge: 2019-07-19 | Disposition: A | Discharge status: Home / Self Care | Payer: BC Managed Care – PPO | Source: Ambulatory Visit | Attending: Gynecologic Oncology | Admitting: Gynecologic Oncology

## 2019-07-19 ENCOUNTER — Other Ambulatory Visit: Payer: Self-pay

## 2019-07-19 DIAGNOSIS — Z833 Family history of diabetes mellitus: Secondary | ICD-10-CM | POA: Insufficient documentation

## 2019-07-19 DIAGNOSIS — Z8543 Personal history of malignant neoplasm of ovary: Secondary | ICD-10-CM | POA: Diagnosis not present

## 2019-07-19 DIAGNOSIS — Z9079 Acquired absence of other genital organ(s): Secondary | ICD-10-CM | POA: Diagnosis not present

## 2019-07-19 DIAGNOSIS — Z8249 Family history of ischemic heart disease and other diseases of the circulatory system: Secondary | ICD-10-CM | POA: Insufficient documentation

## 2019-07-19 DIAGNOSIS — Z79899 Other long term (current) drug therapy: Secondary | ICD-10-CM | POA: Diagnosis not present

## 2019-07-19 DIAGNOSIS — Z9221 Personal history of antineoplastic chemotherapy: Secondary | ICD-10-CM | POA: Diagnosis not present

## 2019-07-19 DIAGNOSIS — Z90710 Acquired absence of both cervix and uterus: Secondary | ICD-10-CM | POA: Diagnosis not present

## 2019-07-19 DIAGNOSIS — C561 Malignant neoplasm of right ovary: Secondary | ICD-10-CM | POA: Insufficient documentation

## 2019-07-19 DIAGNOSIS — F419 Anxiety disorder, unspecified: Secondary | ICD-10-CM | POA: Diagnosis not present

## 2019-07-19 DIAGNOSIS — C563 Malignant neoplasm of bilateral ovaries: Secondary | ICD-10-CM

## 2019-07-19 DIAGNOSIS — C562 Malignant neoplasm of left ovary: Secondary | ICD-10-CM | POA: Diagnosis not present

## 2019-07-19 DIAGNOSIS — Z90722 Acquired absence of ovaries, bilateral: Secondary | ICD-10-CM | POA: Diagnosis not present

## 2019-07-19 DIAGNOSIS — E079 Disorder of thyroid, unspecified: Secondary | ICD-10-CM | POA: Insufficient documentation

## 2019-07-19 DIAGNOSIS — R918 Other nonspecific abnormal finding of lung field: Secondary | ICD-10-CM | POA: Diagnosis not present

## 2019-07-19 LAB — BASIC METABOLIC PANEL - CANCER CENTER ONLY
Anion gap: 9 (ref 5–15)
BUN: 9 mg/dL (ref 6–20)
CO2: 27 mmol/L (ref 22–32)
Calcium: 9.3 mg/dL (ref 8.9–10.3)
Chloride: 105 mmol/L (ref 98–111)
Creatinine: 0.81 mg/dL (ref 0.44–1.00)
GFR, Est AFR Am: >60 mL/min (ref >60)
GFR, Estimated: >60 mL/min (ref >60)
Glucose, Bld: 128 mg/dL — ABNORMAL HIGH (ref 70–99)
Potassium: 3.9 mmol/L (ref 3.5–5.1)
Sodium: 141 mmol/L (ref 135–145)

## 2019-07-19 MED ORDER — SODIUM CHLORIDE (PF) 0.9 % IJ SOLN
INTRAMUSCULAR | Status: AC
  Filled 2019-07-19: qty 50

## 2019-07-19 MED ORDER — IOHEXOL 300 MG/ML  SOLN
75.0000 mL | Freq: Once | INTRAMUSCULAR | Status: AC | PRN
  Administered 2019-07-19: 75 mL via INTRAVENOUS

## 2019-07-20 LAB — CA 125: Cancer Antigen (CA) 125: 23.5 U/mL (ref 0.0–38.1)

## 2019-07-23 ENCOUNTER — Other Ambulatory Visit: Payer: Self-pay

## 2019-07-23 ENCOUNTER — Inpatient Hospital Stay (HOSPITAL_BASED_OUTPATIENT_CLINIC_OR_DEPARTMENT_OTHER): Payer: BC Managed Care – PPO | Admitting: Gynecologic Oncology

## 2019-07-23 VITALS — BP 119/68 | HR 80 | Temp 98.3°F | Resp 17 | Ht 68.0 in | Wt 325.2 lb

## 2019-07-23 DIAGNOSIS — K439 Ventral hernia without obstruction or gangrene: Secondary | ICD-10-CM

## 2019-07-23 DIAGNOSIS — Z90710 Acquired absence of both cervix and uterus: Secondary | ICD-10-CM | POA: Diagnosis not present

## 2019-07-23 DIAGNOSIS — Z8543 Personal history of malignant neoplasm of ovary: Secondary | ICD-10-CM | POA: Diagnosis not present

## 2019-07-23 DIAGNOSIS — R918 Other nonspecific abnormal finding of lung field: Secondary | ICD-10-CM

## 2019-07-23 DIAGNOSIS — C563 Malignant neoplasm of bilateral ovaries: Secondary | ICD-10-CM

## 2019-07-23 DIAGNOSIS — M6289 Other specified disorders of muscle: Secondary | ICD-10-CM

## 2019-07-23 DIAGNOSIS — Z9079 Acquired absence of other genital organ(s): Secondary | ICD-10-CM

## 2019-07-23 DIAGNOSIS — E079 Disorder of thyroid, unspecified: Secondary | ICD-10-CM | POA: Diagnosis not present

## 2019-07-23 DIAGNOSIS — Z90722 Acquired absence of ovaries, bilateral: Secondary | ICD-10-CM | POA: Diagnosis not present

## 2019-07-23 DIAGNOSIS — Z79899 Other long term (current) drug therapy: Secondary | ICD-10-CM | POA: Diagnosis not present

## 2019-07-23 DIAGNOSIS — R41840 Attention and concentration deficit: Secondary | ICD-10-CM

## 2019-07-23 DIAGNOSIS — C561 Malignant neoplasm of right ovary: Secondary | ICD-10-CM

## 2019-07-23 DIAGNOSIS — Z9221 Personal history of antineoplastic chemotherapy: Secondary | ICD-10-CM | POA: Diagnosis not present

## 2019-07-23 DIAGNOSIS — Z833 Family history of diabetes mellitus: Secondary | ICD-10-CM | POA: Diagnosis not present

## 2019-07-23 DIAGNOSIS — Z8249 Family history of ischemic heart disease and other diseases of the circulatory system: Secondary | ICD-10-CM | POA: Diagnosis not present

## 2019-07-23 DIAGNOSIS — F419 Anxiety disorder, unspecified: Secondary | ICD-10-CM | POA: Diagnosis not present

## 2019-07-23 MED ORDER — AMPHETAMINE-DEXTROAMPHETAMINE 5 MG PO TABS: 5.0000 mg | ORAL_TABLET | Freq: Two times a day (BID) | ORAL | 0 refills | Status: DC

## 2019-07-23 NOTE — Progress Notes (Signed)
Follow Up Note: Gyn-Onc  [name] 49 y.o. female  CC:  Chief Complaint  Patient presents with  . Ovarian cancer, bilateral ([place])    Follow up   Assessment/Plan:   [name] is a 49 y.o. 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 s/p adjuvant carboplatin/paclitaxel chemotherapy (completed November, 2016).  No evidence of recurrence on today's exam but she has a symptomatic umbilical hernia after a lap cholecystectomy. Recommend CT abd/pelvis to evaluate better. Recommmended she follow-up with her surgeon, Dr Harlow Asa to evaluate her symptomatic hernia.  Pelvic pain - referred to pelvic PT.  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 Will prescribe monthly. Patient to discuss her brain fog symptoms with her neurologist.  Low libido/menopausal symptoms - vivelle dot.   Recommend follow-up in 6 months. Will continue these until November, 2021 after which time she will be released from cancer surveillance for follow-up with her PCP.   HPI:  [name] is a 49 year old female woman who presented to the [facility] Emergency department in July, 2016 with acute onset abdominal pain, dyspnea, and bilateral ovarian masses seen on CT. She was transferred to [facility] from [facility] for further evaluation and treatment. She reported a history of endometriosis and infertility but no other gynecologic history. Her last pap smear was multiple years prior to presentation and she denied a history of dysplasia along with abnormal uterine bleeding.  Preop Labs and Imaging:  CA 125: 554  CEA: 0.9  CA 19-9: 587  CT 11/09/14: IMPRESSION: 1. Large multiloculated partially cystic partially solid mass replacing the left ovary. The right ovary is also enlarged, heterogeneous, and abnormal in appearance with the inferior margin immediately abutting this mass lesion. Despite trace fat plane along the superior aspect of the right ovary and cystic/solid mass, the right ovary is markedly abnormal in  appearance and certainly involved. These findings are concerning for a primary ovarian malignancy involving both ovaries. A pelvic ultrasound has been ordered the time of examination and may be useful in further evaluation of these pelvic masses. 2. The base of the appendix is slightly dilated and demonstrates hyperenhancement of the wall. Additionally, the lateral aspect of the base the appendix immediately abuts the right side of the pelvic mass. These findings may reflect appendiceal involvement. 3. Minimal abutment of the sigmoid colon with relatively preserved fat plane throughout. No differential dilatation of the sigmoid colon or obvious tethering to suggest sigmoid involvement. However, no discrete fat plane is seen within a short segment as described above. 4. Trace ascites. No hydronephrosis or omental caking. 5. Minimal perihepatic free fluid as well less enlarged portacaval lymph node node. 6. Subtle posterior right hepatic dome hypodensity is too small to characterize. While this lesion does not have the typical appearance of a metastasis, this finding remains indeterminate on this suboptimally timed study. If clinically indicated, a MRI of the abdomen without and with contrast may be obtained for further evaluation.  On 11/10/14, she underwent an exploratory laparotomy and radical tumor debulking with lysis of adhesions for 90 minutes and bilateral ureterolysis for retroperitoneal fibrosis by Dr. Thurston Pounds at [facility].  Operative findings included:  1. Chocolate colored ascites immediately noted upon abdominal entry consistent with ruptured endometrioma  2. Bilateral ovarian masses with chocolate fluid, left side measuring ~12cm with ~8cm solid component, right side measuring ~6cm (frozen section of the left ovary showing invasive carcinoma concerning for clear cell)  3. 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  10 cm fibroid uterus with normal cervix to palpation  5. Normal abdominal survey with normal appearing omentum, normal appearing appendix retroperitoneal and running cephalad along the cecum, normal small and large bowel without evidence of cancer spread, smooth diaphragms bilaterally, smooth liver edge with hepatomegaly, normal stomach and normal kidneys on palpation  6. Sigmoid colon densely adherent to the uterus and posterior cervix precluding cervical excision  7. No pathologically enlarged lymph nodes but discolored shotty left obturator and aortic lymph nodes excised  8. R0 resection with no gross residual cancer at surgery close.  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 (please see synoptic report)  - Endometriosis  Left fallopian tube:  - Negative for involvement by clear cell carcinoma  B: Ovary and tube, right salpingo-oophorectomy  Right ovary:  - Ovarian clear cell carcinoma (please see synoptic report)  - Endometriosis  Right fallopian tube:  - Negative for involvement by clear cell carcinoma  - Endometriosis  C: Uterus, supracervical hysterectomy  - Negative for involvement by clear cell carcinoma  Additional findings:  Endometrium: - Weakly proliferative endometrium  - No hyperplasia or carcinoma identified  Myometrium:  - Extensive adenomyosis  D: Lymph node, aortic, regional resection  - One lymph node negative for metastatic carcinoma (0/1)  E: Lymph node, right pelvic, regional resection  - Four lymph nodes negative for metastatic carcinoma (0/4)  F: Lymph node, left pelvic, regional resection  - Two lymph nodes negative for metastatic carcinoma (0/2)  G: Omentum, omentectomy  - Negative for involvement by clear cell carcinoma  - Mature adipose tissue  H: Peritoneum, biopsy  - Negative for involvement by clear cell carcinoma  - Fibroadipose tissue  A:  Pelvic washing  - No malignant cells identified (see comment)  B: Diaphragm, brushing/scraping  - No malignant cells identified  - Reactive mesothelial cells  Interval History:   She went on to receive 6 cycles of adjuvant carboplatin and paclitaxel chemotherapy completed in November, 2016.  07/23/19 CA 125 23.5.   CT chest was performed on July 19, 2019 to follow-up previous findings of a right middle lobe pulmonary nodule.  This was stable on repeat scans.  There was a 4 mm indeterminate left upper lobe pulmonary nodule which had not been included in the prior scan which would have been in abdomen pelvis CT.  No follow-up is needed the patient was low risk which she is considered as she is a non-smoker.  In March 2020 she underwent a laparoscopic cholecystectomy for choledocholithiasis.  This was complicated by postoperative common bile duct stone and pancreatitis.  Following that surgical procedure she had ongoing issues of epigastric discomfort and umbilical discomfort at the site of where the umbilical incision had been.  She had been followed up with a virtual visit with [facility] but no in person visit.  Review of Systems  Constitutional: Feels well.  No fever, chills, early satiety, unintentional weight loss or gain.  Appetite improving.  Cardiovascular: No chest pain, shortness of breath, or edema.  Pulmonary: No cough or wheeze.  Gastrointestinal: + abdominal discomfort Genitourinary: + dyspareunia Musculoskeletal: No myalgia or joint pain. Neurologic: No weakness, numbness, or change in gait.  Psychology: No depression, anxiety, or insomnia.  Current Meds:  Outpatient Encounter Medications as of 07/23/2019  Medication Sig  . acetaminophen (TYLENOL) 650 MG CR tablet Take 1,300  mg by mouth daily as needed for pain.  Marland Kitchen ALPRAZolam (XANAX) 1 MG tablet Take 1 tablet (1 mg total) by mouth 2 (two) times daily as needed. for anxiety  . clobetasol cream (TEMOVATE) [id number] %  Apply 1 application topically 2 (two) times daily.  Marland Kitchen estradiol (VIVELLE-DOT) 0.075 MG/24HR Place 1 patch onto the skin 2 (two) times a week.  . gabapentin (NEURONTIN) 600 MG tablet Take 2 tablets (1,200 mg total) by mouth 3 (three) times daily. (Patient taking differently: Take 1,200 mg by mouth at bedtime. )  . hydroxypropyl methylcellulose / hypromellose (ISOPTO TEARS / GONIOVISC) 2.5 % ophthalmic solution Place 1 drop into both eyes daily as needed for dry eyes.  Marland Kitchen ibuprofen (MOTRIN IB) 200 MG tablet Take 3 tablets (600 mg total) by mouth every 6 (six) hours as needed for mild pain.  Marland Kitchen levothyroxine (SYNTHROID) 75 MCG tablet Take 1 tablet (75 mcg total) by mouth daily.  Marland Kitchen lisinopril (ZESTRIL) 5 MG tablet Take 1 tablet (5 mg total) by mouth daily.  . Melatonin 10 MG TABS Take 10 mg by mouth at bedtime.  Marland Kitchen neomycin-colistin-hydrocortisone-thonzonium (CORTISPORIN-TC) 3.07-09-08-0.5 MG/ML OTIC suspension Place 3 drops into the right ear 4 (four) times daily.  . pantoprazole (PROTONIX) 40 MG tablet TAKE 1 TABLET BY MOUTH TWICE A DAY FOR 2 WEEKS, THEN TAKE 1 TABLET ONCE DAILY  . sertraline (ZOLOFT) 100 MG tablet Take 1 tablet (100 mg total) by mouth daily.  . sodium chloride (OCEAN) 0.65 % SOLN nasal spray Place 1 spray into both nostrils daily as needed for congestion.  Marland Kitchen VALERIAN PO Take 1 tablet by mouth daily.  Marland Kitchen amphetamine-dextroamphetamine (ADDERALL) 5 MG tablet Take 1 tablet (5 mg total) by mouth 2 (two) times daily with a meal.  . zolpidem (AMBIEN) 10 MG tablet Take 1 tablet (10 mg total) by mouth at bedtime as needed for sleep.  . [DISCONTINUED] albuterol (PROVENTIL HFA;VENTOLIN HFA) 108 (90 Base) MCG/ACT inhaler TAKE 2 PUFFS BY MOUTH EVERY 6 HOURS AS NEEDED FOR WHEEZE OR SHORTNESS OF BREATH (Patient not taking: Reported on 07/23/2018)  . [DISCONTINUED] amoxicillin-clavulanate (AUGMENTIN) 875-125 MG tablet Take 1 tablet by mouth 2 (two) times daily. (Patient not taking: Reported on 07/23/2019)   . [DISCONTINUED] amphetamine-dextroamphetamine (ADDERALL) 5 MG tablet Take 1 tablet (5 mg total) by mouth 2 (two) times daily with a meal. (Patient not taking: Reported on 07/23/2018)  . [DISCONTINUED] fluticasone (FLONASE) 50 MCG/ACT nasal spray Place 2 sprays into both nostrils daily. (Patient not taking: Reported on 07/23/2019)  . [DISCONTINUED] montelukast (SINGULAIR) 10 MG tablet Take 1 tablet (10 mg total) by mouth at bedtime. (Patient not taking: Reported on 07/23/2018)  . [DISCONTINUED] oxyCODONE (OXY IR/ROXICODONE) 5 MG immediate release tablet Take 1-2 tablets (5-10 mg total) by mouth every 6 (six) hours as needed for moderate pain, severe pain or breakthrough pain.  . [DISCONTINUED] simethicone (GAS-X) 80 MG chewable tablet Chew 1 tablet (80 mg total) by mouth 4 (four) times daily as needed for flatulence. (Patient not taking: Reported on 07/23/2019)   No facility-administered encounter medications on file as of 07/23/2019.    Allergy:  Allergies  Allergen Reactions  . Moxifloxacin Anaphylaxis    needs epinephrine shot  . Quinolones Anaphylaxis, Shortness Of Breath and Swelling  . Doxycycline Nausea And Vomiting  . Escitalopram Oxalate Other (See Comments)     fatigue  . Sulfonamide Derivatives Swelling and Rash    needs epinephrine shot--rash and lip swelling    Social Hx:  Social History   Socioeconomic History  . Marital status: Divorced    Spouse name: Not on file  . Number of children: 0  . Years of education: Not on file  . Highest education level: Not on file  Occupational History  . Not on file  Tobacco Use  . Smoking status: Never Smoker  . Smokeless tobacco: Never Used  Substance and Sexual Activity  . Alcohol use: Yes    Alcohol/week: 0.0 standard drinks    Comment: Rarely  . Drug use: No  . Sexual activity: Not on file  Other Topics Concern  . Not on file  Social History Narrative   Lives with boyfriend in a one story home.  Has no children.      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     Education: college.   Social Determinants of Health   Financial Resource Strain:   . Difficulty of Paying Living Expenses:   Food Insecurity:   . Worried About Charity fundraiser in the Last Year:   . Arboriculturist in the Last Year:   Transportation Needs:   . Film/video editor (Medical):   Marland Kitchen Lack of Transportation (Non-Medical):   Physical Activity:   . Days of Exercise per Week:   . Minutes of Exercise per Session:   Stress:   . Feeling of Stress :   Social Connections:   . Frequency of Communication with Friends and Family:   . Frequency of Social Gatherings with Friends and Family:   . Attends Religious Services:   . Active Member of Clubs or Organizations:   . Attends Archivist Meetings:   Marland Kitchen Marital Status:   Intimate Partner Violence:   . Fear of Current or Ex-Partner:   . Emotionally Abused:   Marland Kitchen Physically Abused:   . Sexually Abused:     Past Surgical Hx:  Past Surgical History:  Procedure Laterality Date  . ABDOMINAL HYSTERECTOMY  11/10/14   at [facility], Exp lap, supracervical hyst, BSO, infracolic omentectomy, lymphadenectomy, aortic lymph node sampling  . CHOLECYSTECTOMY N/A 07/24/2018   Procedure: LAPAROSCOPIC CHOLECYSTECTOMY WITH INTRAOPERATIVE CHOLANGIOGRAM;  Surgeon: Armandina Gemma, MD;  Location: [facility];  Service: General;  Laterality: N/A;  . ERCP N/A 07/26/2018   Procedure: ENDOSCOPIC RETROGRADE CHOLANGIOPANCREATOGRAPHY (ERCP);  Surgeon: Ladene Artist, MD;  Location: [facility];  Service: Endoscopy;  Laterality: N/A;  . IR GENERIC HISTORICAL  05/12/2016   IR REMOVAL TUN ACCESS W/ PORT W/O FL MOD SED 05/12/2016 [facility]  . REMOVAL OF STONES  07/26/2018   Procedure: REMOVAL OF STONES;  Surgeon: Ladene Artist, MD;  Location: [facility];  Service: Endoscopy;;  . Joan Mayans  07/26/2018   Procedure: SPHINCTEROTOMY;  Surgeon: Ladene Artist, MD;  Location: [facility];  Service: Endoscopy;;    Past  Medical Hx:  Past Medical History:  Diagnosis Date  . Anxiety   . Elevated blood pressure, situational 05/05/2016  . Endometriosis   . Exercise-induced asthma 09/08/2015  . Ovarian cancer, bilateral ([place]) 11/28/2014  . PONV (postoperative nausea and vomiting)   . Thyroid disease     Family Hx:  Family History  Problem Relation Age of Onset  . Hypertension Mother   . Diabetes Mother   . Stroke Mother        Deceased  . Atrial fibrillation Father        Living  . Diabetes Maternal Aunt   . Heart failure Maternal Grandmother   . Heart attack  Maternal Grandfather   . Dementia Paternal Grandmother   . Dementia Paternal Grandfather   . Healthy Brother     Vitals:  Blood pressure 119/68, pulse 80, temperature 98.3 F (36.8 C), temperature source Temporal, resp. rate 17, height 5\' 8"  (1.727 m), weight (!) 325 lb 3.2 oz (147.5 kg), last menstrual period 11/02/2014, SpO2 99 %.  Physical Exam:  General: Well developed, well nourished female in no acute distress. Alert and oriented x 3.  Cardiovascular: Regular rate and rhythm. S1 and S2 normal.  Lungs: Clear to auscultation bilaterally. No wheezes/crackles/rhonchi noted.  Skin: No rashes or lesions present. Back: No CVA tenderness.  Abdomen: Abdomen soft, non-tender and morbidly obese. Active bowel sounds in all quadrants. No masses Genitourinary: bimanual exam reveals a cervix with no pelvic masses. Cervix normal. Extremities: Edema noted bilaterally, slightly more in the left lower extremity but non-pitting.  No erythema or tenderness noted with the LLE.  Negative Homans sign with the LLE.  No bilateral cyanosis or clubbing.    Thereasa Solo, MD 07/23/2019, 12:24 PM

## 2019-07-23 NOTE — Patient Instructions (Signed)
You should follow-up with Dr Armandina Gemma from [facility] to discuss the hernia in your umbilicus. Their number is [phone].  Dr Denman George is ordering a CT scan to better evaluate your abdominal hernia in preparation for this appointment.  Dr Denman George is ordering pelvic Physical Therapy for your pelvic pain.  Please contact Dr Serita Grit office (at [phone]) in July to request an appointment with her for September, 2021. After that time your cancer surveillance will be complete as you are 5 years after completing treatment.

## 2019-07-30 ENCOUNTER — Telehealth: Payer: Self-pay | Admitting: *Deleted

## 2019-07-30 NOTE — Telephone Encounter (Signed)
Called and left the patient a message to call the office back. The pelvic rehab at [place] [phone]) is trying to get a hold of there to schedule an appt

## 2019-08-05 ENCOUNTER — Ambulatory Visit (HOSPITAL_COMMUNITY): Payer: BC Managed Care – PPO

## 2019-08-14 ENCOUNTER — Ambulatory Visit (HOSPITAL_COMMUNITY): Admission: RE | Admit: 2019-08-14 | Payer: BC Managed Care – PPO | Source: Ambulatory Visit

## 2019-08-14 ENCOUNTER — Telehealth: Payer: Self-pay | Admitting: *Deleted

## 2019-08-14 NOTE — Telephone Encounter (Signed)
received a message from CT that the patient no showed for her appt today. Called the patient and left a message to call the office back

## 2019-09-11 ENCOUNTER — Other Ambulatory Visit: Payer: Self-pay | Admitting: Gynecologic Oncology

## 2019-09-11 DIAGNOSIS — E8940 Asymptomatic postprocedural ovarian failure: Secondary | ICD-10-CM

## 2019-11-08 ENCOUNTER — Telehealth: Payer: Self-pay | Admitting: Neurology

## 2019-11-08 ENCOUNTER — Other Ambulatory Visit: Payer: Self-pay | Admitting: Internal Medicine

## 2019-11-08 ENCOUNTER — Encounter: Payer: Self-pay | Admitting: Internal Medicine

## 2019-11-08 DIAGNOSIS — F419 Anxiety disorder, unspecified: Secondary | ICD-10-CM

## 2019-11-08 MED ORDER — ALPRAZOLAM 1 MG PO TABS: 1.0000 mg | ORAL_TABLET | Freq: Two times a day (BID) | ORAL | 2 refills | Status: DC | PRN

## 2019-11-08 NOTE — Telephone Encounter (Signed)
Done erx 

## 2019-11-08 NOTE — Telephone Encounter (Signed)
Patient called and requested refills on gabapentin 600 MG. She hasn't been seen since 03/2018.   [facility] [place], [facility]

## 2019-11-09 ENCOUNTER — Encounter: Payer: Self-pay | Admitting: Internal Medicine

## 2019-11-09 ENCOUNTER — Telehealth (INDEPENDENT_AMBULATORY_CARE_PROVIDER_SITE_OTHER): Payer: BC Managed Care – PPO | Admitting: Internal Medicine

## 2019-11-09 ENCOUNTER — Other Ambulatory Visit: Payer: Self-pay

## 2019-11-09 DIAGNOSIS — J0100 Acute maxillary sinusitis, unspecified: Secondary | ICD-10-CM

## 2019-11-09 MED ORDER — FLUTICASONE PROPIONATE 50 MCG/ACT NA SUSP
2.0000 | Freq: Every day | NASAL | 1 refills | Status: DC
Start: 2019-11-09 — End: 2022-05-17

## 2019-11-09 MED ORDER — AMOXICILLIN 500 MG PO CAPS: 1000.0000 mg | ORAL_CAPSULE | Freq: Two times a day (BID) | ORAL | 0 refills | Status: AC

## 2019-11-09 MED ORDER — HYDROCORTISONE 2.5 % EX CREA: TOPICAL_CREAM | Freq: Three times a day (TID) | CUTANEOUS | 0 refills | Status: DC | PRN

## 2019-11-09 NOTE — Progress Notes (Signed)
Subjective:    Patient ID: [name], female    DOB: March 15, 1971, 49 y.o.   MRN: [id number]  HPI  Video virtual visit due to sinus problems Identification done  Reviewed limitations and billing Participants-- patient in her home and I am in my office  Having post nasal drip Causing "chronic cough" for a couple of weeks Pressure and pain in her right ear and maxillary area Some teeth pain  Chronic allergies--often in summer Uses loratadine--not helping much flonase in the past--but not recently No SOB--doesn't feel congested in chest  Current Outpatient Medications on File Prior to Visit  Medication Sig Dispense Refill  . acetaminophen (TYLENOL) 650 MG CR tablet Take 1,300 mg by mouth daily as needed for pain.    Marland Kitchen ALPRAZolam (XANAX) 1 MG tablet Take 1 tablet (1 mg total) by mouth 2 (two) times daily as needed. for anxiety 60 tablet 2  . amphetamine-dextroamphetamine (ADDERALL) 5 MG tablet Take 1 tablet (5 mg total) by mouth 2 (two) times daily with a meal. 60 tablet 0  . clobetasol cream (TEMOVATE) 7.67 % Apply 1 application topically 2 (two) times daily. 30 g 1  . estradiol (VIVELLE-DOT) 0.075 MG/24HR PLACE 1 PATCH ONTO THE SKIN 2 (TWO) TIMES A WEEK. 24 patch 2  . gabapentin (NEURONTIN) 600 MG tablet Take 2 tablets (1,200 mg total) by mouth 3 (three) times daily. (Patient taking differently: Take 1,200 mg by mouth at bedtime. ) 540 tablet 3  . hydroxypropyl methylcellulose / hypromellose (ISOPTO TEARS / GONIOVISC) 2.5 % ophthalmic solution Place 1 drop into both eyes daily as needed for dry eyes.    Marland Kitchen levothyroxine (SYNTHROID) 75 MCG tablet Take 1 tablet (75 mcg total) by mouth daily. 90 tablet 3  . lisinopril (ZESTRIL) 5 MG tablet Take 1 tablet (5 mg total) by mouth daily. 90 tablet 3  . Melatonin 10 MG TABS Take 10 mg by mouth at bedtime.    Marland Kitchen neomycin-colistin-hydrocortisone-thonzonium (CORTISPORIN-TC) 3.07-09-08-0.5 MG/ML OTIC suspension Place 3 drops into the right ear 4  (four) times daily. 10 mL 0  . pantoprazole (PROTONIX) 40 MG tablet TAKE 1 TABLET BY MOUTH TWICE A DAY FOR 2 WEEKS, THEN TAKE 1 TABLET ONCE DAILY 30 tablet 5  . sertraline (ZOLOFT) 100 MG tablet Take 1 tablet (100 mg total) by mouth daily. 90 tablet 3  . sodium chloride (OCEAN) 0.65 % SOLN nasal spray Place 1 spray into both nostrils daily as needed for congestion.    Marland Kitchen VALERIAN PO Take 1 tablet by mouth daily.    Marland Kitchen zolpidem (AMBIEN) 10 MG tablet Take 1 tablet (10 mg total) by mouth at bedtime as needed for sleep. 90 tablet 1   No current facility-administered medications on file prior to visit.    Allergies  Allergen Reactions  . Moxifloxacin Anaphylaxis    needs epinephrine shot  . Quinolones Anaphylaxis, Shortness Of Breath and Swelling  . Doxycycline Nausea And Vomiting  . Escitalopram Oxalate Other (See Comments)     fatigue  . Sulfonamide Derivatives Swelling and Rash    needs epinephrine shot--rash and lip swelling    Past Medical History:  Diagnosis Date  . Anxiety   . Elevated blood pressure, situational 05/05/2016  . Endometriosis   . Exercise-induced asthma 09/08/2015  . Ovarian cancer, bilateral ([place]) 11/28/2014  . PONV (postoperative nausea and vomiting)   . Thyroid disease     Past Surgical History:  Procedure Laterality Date  . ABDOMINAL HYSTERECTOMY  11/10/14   at  [facility], Exp lap, supracervical hyst, BSO, infracolic omentectomy, lymphadenectomy, aortic lymph node sampling  . CHOLECYSTECTOMY N/A 07/24/2018   Procedure: LAPAROSCOPIC CHOLECYSTECTOMY WITH INTRAOPERATIVE CHOLANGIOGRAM;  Surgeon: Armandina Gemma, MD;  Location: [facility];  Service: General;  Laterality: N/A;  . ERCP N/A 07/26/2018   Procedure: ENDOSCOPIC RETROGRADE CHOLANGIOPANCREATOGRAPHY (ERCP);  Surgeon: Ladene Artist, MD;  Location: [facility];  Service: Endoscopy;  Laterality: N/A;  . IR GENERIC HISTORICAL  05/12/2016   IR REMOVAL TUN ACCESS W/ PORT W/O FL MOD SED 05/12/2016 [facility]  . REMOVAL OF  STONES  07/26/2018   Procedure: REMOVAL OF STONES;  Surgeon: Ladene Artist, MD;  Location: [facility];  Service: Endoscopy;;  . Joan Mayans  07/26/2018   Procedure: SPHINCTEROTOMY;  Surgeon: Ladene Artist, MD;  Location: [facility];  Service: Endoscopy;;    Family History  Problem Relation Age of Onset  . Hypertension Mother   . Diabetes Mother   . Stroke Mother        Deceased  . Atrial fibrillation Father        Living  . Diabetes Maternal Aunt   . Heart failure Maternal Grandmother   . Heart attack Maternal Grandfather   . Dementia Paternal Grandmother   . Dementia Paternal Grandfather   . Healthy Brother     Social History   Socioeconomic History  . Marital status: Divorced    Spouse name: Not on file  . Number of children: 0  . Years of education: Not on file  . Highest education level: Not on file  Occupational History  . Not on file  Tobacco Use  . Smoking status: Never Smoker  . Smokeless tobacco: Never Used  Vaping Use  . Vaping Use: Never used  Substance and Sexual Activity  . Alcohol use: Yes    Alcohol/week: 0.0 standard drinks    Comment: Rarely  . Drug use: No  . Sexual activity: Not on file  Other Topics Concern  . Not on file  Social History Narrative   Lives with boyfriend in a one story home.  Has no children.     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     Education: college.   Social Determinants of Health   Financial Resource Strain:   . Difficulty of Paying Living Expenses:   Food Insecurity:   . Worried About Charity fundraiser in the Last Year:   . Arboriculturist in the Last Year:   Transportation Needs:   . Film/video editor (Medical):   Marland Kitchen Lack of Transportation (Non-Medical):   Physical Activity:   . Days of Exercise per Week:   . Minutes of Exercise per Session:   Stress:   . Feeling of Stress :   Social Connections:   . Frequency of Communication with Friends and Family:   . Frequency of Social Gatherings with Friends  and Family:   . Attends Religious Services:   . Active Member of Clubs or Organizations:   . Attends Archivist Meetings:   Marland Kitchen Marital Status:   Intimate Partner Violence:   . Fear of Current or Ex-Partner:   . Emotionally Abused:   Marland Kitchen Physically Abused:   . Sexually Abused:      Review of Systems Some dizziness Has eczema--dry hands. Uses moisturizers Chronic anxiety--wonders if that is driving some of the skin issues No fever--but feels tired    Objective:   Physical Exam Constitutional:      General: She is not  in acute distress.    Appearance: Normal appearance.  Pulmonary:     Effort: Pulmonary effort is normal. No respiratory distress.  Neurological:     Mental Status: She is alert.            Assessment & Plan:

## 2019-11-09 NOTE — Assessment & Plan Note (Signed)
Has had recurrent symptoms of allergies Now 2 weeks of clear sinus symptoms--suggestive of bacterial sinusitis Will have her start flonase 1 week of amoxil Double the loratadine

## 2019-11-11 NOTE — Telephone Encounter (Signed)
Pls confirm how she is taking it, thanks

## 2019-11-12 ENCOUNTER — Other Ambulatory Visit: Payer: Self-pay

## 2019-11-12 MED ORDER — GABAPENTIN 600 MG PO TABS: 300.0000 mg | ORAL_TABLET | Freq: Three times a day (TID) | ORAL | 1 refills | Status: DC

## 2019-11-12 NOTE — Telephone Encounter (Signed)
Left VM requesting pt return call to verify gabapentin dosing and schedule.  Spoke with pt who states she lost her insurance when she was taking gabapentin 1200mg  TID. She cut back dose on her own but now having increased neuropathy. Just taking PRN now, now taking approx 300mg  BID but not controlling neuropathy. Has insurance again so will have coverage to get medication. Has appt 02/14/2020 with Dr Posey Pronto. Feels she needs higher dose at night but the 1200mg  TID made her too sleepy so doesn't want to start that dose again. She wants a script to cover her until her appt but uncertain what dose would be preferable.

## 2019-11-12 NOTE — Telephone Encounter (Signed)
Can start back at 300mg  TID and call for an update at the end of the month when Dr. Posey Pronto back, can adjust as needed then. Pls send in Rx with 1 refill for now, call for update and further refills/instructions. Thanks

## 2019-11-12 NOTE — Telephone Encounter (Signed)
Notified pt that new prescription sent in for gabapentin 300mg  TID with one refill, inst to call us back at the end of the month if that's not effective and Dr Posey Pronto will be back and able to adjust accordingly, she verbalized understanding.

## 2019-11-14 NOTE — Telephone Encounter (Signed)
Has this been completed?  Sending to clinical staff for review: Okay to sign/close encounter or is further follow up needed? 

## 2019-12-06 ENCOUNTER — Encounter: Payer: Self-pay | Admitting: Neurology

## 2019-12-06 ENCOUNTER — Ambulatory Visit (INDEPENDENT_AMBULATORY_CARE_PROVIDER_SITE_OTHER): Payer: BC Managed Care – PPO | Admitting: Neurology

## 2019-12-06 ENCOUNTER — Other Ambulatory Visit: Payer: Self-pay

## 2019-12-06 VITALS — BP 127/80 | HR 90 | Ht 68.0 in | Wt 331.6 lb

## 2019-12-06 DIAGNOSIS — S39012A Strain of muscle, fascia and tendon of lower back, initial encounter: Secondary | ICD-10-CM

## 2019-12-06 DIAGNOSIS — T451X5A Adverse effect of antineoplastic and immunosuppressive drugs, initial encounter: Secondary | ICD-10-CM

## 2019-12-06 DIAGNOSIS — G5603 Carpal tunnel syndrome, bilateral upper limbs: Secondary | ICD-10-CM | POA: Diagnosis not present

## 2019-12-06 DIAGNOSIS — G620 Drug-induced polyneuropathy: Secondary | ICD-10-CM

## 2019-12-06 MED ORDER — CYCLOBENZAPRINE HCL 5 MG PO TABS
5.0000 mg | ORAL_TABLET | Freq: Three times a day (TID) | ORAL | 1 refills | Status: DC | PRN
Start: 2019-12-06 — End: 2020-02-03

## 2019-12-06 MED ORDER — GABAPENTIN 600 MG PO TABS: 600.0000 mg | ORAL_TABLET | Freq: Three times a day (TID) | ORAL | 3 refills | Status: DC

## 2019-12-06 NOTE — Patient Instructions (Signed)
Nerve testing of both hands.  Do not apply lotion on the day of testing Start physical therapy for low back strengthening Start flexeril 5mg  at bedtime After one week, increase gabapentin to 600mg  three times daily  Return to clinic in 4 months

## 2019-12-06 NOTE — Progress Notes (Signed)
Follow-up Visit   Date: 12/06/19    [name] MRN: [id number] DOB: 02-17-1971   Interim History: [name] is a 49 y.o. right-handed Caucasian female with ovarian cancer s/p bilateral SPO and hysterectomy and chemotherapy (11/2014), hypothyroidism, endometriosis, migraines, and anxiety returning to the clinic for follow-up of chemotherapy-induced neuropathy, lumbar strain, and new complaints of bilateral hand tingling.   Over the past several months, she has noticed tingling in the in fingers and has problems with opening jars and bottles.  She has known carpal tunnel syndrome which was moderate on EDX from 2017.  She has been compliant using wrist splints.  She continues to have burning sensation in the feet and takes gabapentin 300mg  TID. She also complaints of right sided low back tenderness and burning which radiates into her buttocks.  It is worse with walking and improved by rest. No leg weakness.    Medications:  Current Outpatient Medications on File Prior to Visit  Medication Sig Dispense Refill  . acetaminophen (TYLENOL) 650 MG CR tablet Take 1,300 mg by mouth daily as needed for pain.    Marland Kitchen ALPRAZolam (XANAX) 1 MG tablet Take 1 tablet (1 mg total) by mouth 2 (two) times daily as needed. for anxiety 60 tablet 2  . estradiol (VIVELLE-DOT) 0.075 MG/24HR PLACE 1 PATCH ONTO THE SKIN 2 (TWO) TIMES A WEEK. 24 patch 2  . fluticasone (FLONASE) 50 MCG/ACT nasal spray Place 2 sprays into both nostrils daily. In each nostril 16 g 1  . gabapentin (NEURONTIN) 600 MG tablet Take 0.5 tablets (300 mg total) by mouth 3 (three) times daily. 45 tablet 1  . hydrocortisone 2.5 % cream Apply topically 3 (three) times daily as needed. 28 g 0  . ibuprofen (ADVIL) 200 MG tablet Take 400 mg by mouth every 6 (six) hours as needed.    Marland Kitchen levothyroxine (SYNTHROID) 75 MCG tablet Take 1 tablet (75 mcg total) by mouth daily. 90 tablet 3  . lisinopril (ZESTRIL) 5 MG tablet Take 1 tablet (5 mg  total) by mouth daily. 90 tablet 3  . Melatonin 10 MG TABS Take 10 mg by mouth at bedtime.    . pantoprazole (PROTONIX) 40 MG tablet TAKE 1 TABLET BY MOUTH TWICE A DAY FOR 2 WEEKS, THEN TAKE 1 TABLET ONCE DAILY 30 tablet 5  . sertraline (ZOLOFT) 100 MG tablet Take 1 tablet (100 mg total) by mouth daily. 90 tablet 3  . sodium chloride (OCEAN) 0.65 % SOLN nasal spray Place 1 spray into both nostrils daily as needed for congestion.    Marland Kitchen VALERIAN PO Take 1 tablet by mouth daily.    Marland Kitchen amphetamine-dextroamphetamine (ADDERALL) 5 MG tablet Take 1 tablet (5 mg total) by mouth 2 (two) times daily with a meal. 60 tablet 0  . clobetasol cream (TEMOVATE) 8.18 % Apply 1 application topically 2 (two) times daily. 30 g 1  . hydroxypropyl methylcellulose / hypromellose (ISOPTO TEARS / GONIOVISC) 2.5 % ophthalmic solution Place 1 drop into both eyes daily as needed for dry eyes. (Patient not taking: Reported on 12/06/2019)    . neomycin-colistin-hydrocortisone-thonzonium (CORTISPORIN-TC) 3.07-09-08-0.5 MG/ML OTIC suspension Place 3 drops into the right ear 4 (four) times daily. 10 mL 0  . zolpidem (AMBIEN) 10 MG tablet Take 1 tablet (10 mg total) by mouth at bedtime as needed for sleep. 90 tablet 1   No current facility-administered medications on file prior to visit.    Allergies:  Allergies  Allergen Reactions  . Moxifloxacin Anaphylaxis  needs epinephrine shot  . Quinolones Anaphylaxis, Shortness Of Breath and Swelling  . Doxycycline Nausea And Vomiting  . Escitalopram Oxalate Other (See Comments)     fatigue  . Sulfonamide Derivatives Swelling and Rash    needs epinephrine shot--rash and lip swelling    Vital Signs:  BP 127/80   Pulse 90   Ht 5\' 8"  (1.727 m)   Wt (!) 331 lb 9.6 oz (150.4 kg)   LMP 11/02/2014   SpO2 98%   BMI 50.42 kg/m   Neurological Exam: MENTAL STATUS including orientation to time, place, person, recent and remote memory, attention span and concentration, language, and fund  of knowledge is normal.  Speech is not dysarthric.  CRANIAL NERVES:   Extraocular muscular muscles intact.  No ptosis.    MOTOR:  Motor strength shows 5/5 motor strength throughout. Localized tenderness over the right lower back.   MSRs:  Right                                                                 Left brachioradialis 2+  brachioradialis 2+  biceps 2+  biceps 2+  triceps 1+  triceps 1+  Patellar 1+  Patellar 1+  ankle jerk 0  ankle jerk 0  plantar response down  plantar response down   SENSORY:  Absent temperature and vibration in the feet bilaterally, reduced in the lower legs.   COORDINATION/GAIT:   Gait wide-based and antalgic, stable.   Data: Lab Results  Component Value Date   TSH 5.12 (H) 10/16/2017   NCS/EMG of the upper extremities 11/24/2015:  Bilateral median neuropathy at or distal to the wrist, consistent with clinical diagnosis of carpal tunnel syndrome. Overall, these findings are moderate in degree electrically   IMPRESSION/PLAN: 1. Bilateral carpal tunnel syndrome, moderate - worsening  - NCS/EMG of the hands to reassess symptoms   - Continue to use wrist splints  - Refer to hand specialist if symptoms have progressed on EDX   2.  Lumbar strain- worsening  - start flexeril 5mg  at bedtime  - start physical therapy for low back strengthening   3.  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  - Increase gabapentin to 600mg  twice daily.  She has been as high as 3600mg /d in the past which I do not advise  - OK to use lidocaine ointment to feet   4. Bilateral ovarian cancer s/p SPO and hysterectomy (11/2014) and chemotherapy  Return to clinic in 4 months  Thank you for allowing me to participate in patient's care.  If I can answer any additional questions, I would be pleased to do so.    Sincerely,    Yvetta Drotar K. Posey Pronto, DO

## 2019-12-10 ENCOUNTER — Encounter: Payer: BC Managed Care – PPO | Admitting: Neurology

## 2020-01-31 ENCOUNTER — Encounter: Payer: Self-pay | Admitting: Adult Health

## 2020-01-31 ENCOUNTER — Other Ambulatory Visit: Payer: Self-pay | Admitting: Neurology

## 2020-01-31 ENCOUNTER — Telehealth (INDEPENDENT_AMBULATORY_CARE_PROVIDER_SITE_OTHER): Payer: BC Managed Care – PPO | Admitting: Adult Health

## 2020-01-31 DIAGNOSIS — J0141 Acute recurrent pansinusitis: Secondary | ICD-10-CM | POA: Diagnosis not present

## 2020-01-31 DIAGNOSIS — H60502 Unspecified acute noninfective otitis externa, left ear: Secondary | ICD-10-CM | POA: Diagnosis not present

## 2020-01-31 MED ORDER — NEOMYCIN-POLYMYXIN-HC 1 % OT SOLN: 3.0000 [drp] | Freq: Four times a day (QID) | OTIC | 0 refills | Status: DC

## 2020-01-31 MED ORDER — AMOXICILLIN 500 MG PO CAPS: 500.0000 mg | ORAL_CAPSULE | Freq: Two times a day (BID) | ORAL | 0 refills | Status: AC

## 2020-01-31 NOTE — Progress Notes (Signed)
Virtual Visit via Video Note  I connected with [name] on 01/31/20 at  4:00 PM EDT by a video enabled telemedicine application and verified that I am speaking with the correct person using two identifiers.  Location patient: home Location provider:work or home office Persons participating in the virtual visit: patient, provider  I discussed the limitations of evaluation and management by telemedicine and the availability of in person appointments. The patient expressed understanding and agreed to proceed.   HPI: 49 year old female who is being seen today for acute on chronic issue.  Her symptoms started approximately 7 to 10 days ago.  Symptoms include left-sided ear pain, tightness in her ear canal, with wet drainage.  She also reports left-sided sinus pain and pressure.  She denies fevers, chills.  She has been using Flonase.  She reports long history of sinus and ear issues.  Symptoms do not seem to be improving   ROS: See pertinent positives and negatives per HPI.  Past Medical History:  Diagnosis Date  . Anxiety   . Elevated blood pressure, situational 05/05/2016  . Endometriosis   . Exercise-induced asthma 09/08/2015  . Ovarian cancer, bilateral ([place]) 11/28/2014  . PONV (postoperative nausea and vomiting)   . Thyroid disease     Past Surgical History:  Procedure Laterality Date  . ABDOMINAL HYSTERECTOMY  11/10/14   at [facility], Exp lap, supracervical hyst, BSO, infracolic omentectomy, lymphadenectomy, aortic lymph node sampling  . CHOLECYSTECTOMY N/A 07/24/2018   Procedure: LAPAROSCOPIC CHOLECYSTECTOMY WITH INTRAOPERATIVE CHOLANGIOGRAM;  Surgeon: Armandina Gemma, MD;  Location: [facility];  Service: General;  Laterality: N/A;  . ERCP N/A 07/26/2018   Procedure: ENDOSCOPIC RETROGRADE CHOLANGIOPANCREATOGRAPHY (ERCP);  Surgeon: Ladene Artist, MD;  Location: [facility];  Service: Endoscopy;  Laterality: N/A;  . IR GENERIC HISTORICAL  05/12/2016   IR REMOVAL TUN ACCESS W/ PORT W/O FL  MOD SED 05/12/2016 [facility]  . REMOVAL OF STONES  07/26/2018   Procedure: REMOVAL OF STONES;  Surgeon: Ladene Artist, MD;  Location: [facility];  Service: Endoscopy;;  . Joan Mayans  07/26/2018   Procedure: SPHINCTEROTOMY;  Surgeon: Ladene Artist, MD;  Location: [facility];  Service: Endoscopy;;    Family History  Problem Relation Age of Onset  . Hypertension Mother   . Diabetes Mother   . Stroke Mother        Deceased  . Atrial fibrillation Father        Living  . Diabetes Maternal Aunt   . Heart failure Maternal Grandmother   . Heart attack Maternal Grandfather   . Dementia Paternal Grandmother   . Dementia Paternal Grandfather   . Healthy Brother        Current Outpatient Medications:  .  acetaminophen (TYLENOL) 650 MG CR tablet, Take 1,300 mg by mouth daily as needed for pain., Disp: , Rfl:  .  ALPRAZolam (XANAX) 1 MG tablet, Take 1 tablet (1 mg total) by mouth 2 (two) times daily as needed. for anxiety, Disp: 60 tablet, Rfl: 2 .  cyclobenzaprine (FLEXERIL) 5 MG tablet, Take 1 tablet (5 mg total) by mouth every 8 (eight) hours as needed for muscle spasms., Disp: 30 tablet, Rfl: 1 .  estradiol (VIVELLE-DOT) 0.075 MG/24HR, PLACE 1 PATCH ONTO THE SKIN 2 (TWO) TIMES A WEEK., Disp: 24 patch, Rfl: 2 .  fluticasone (FLONASE) 50 MCG/ACT nasal spray, Place 2 sprays into both nostrils daily. In each nostril, Disp: 16 g, Rfl: 1 .  gabapentin (NEURONTIN) 600 MG tablet, Take 0.5 tablets (  300 mg total) by mouth 3 (three) times daily., Disp: 45 tablet, Rfl: 1 .  gabapentin (NEURONTIN) 600 MG tablet, Take 1 tablet (600 mg total) by mouth 3 (three) times daily., Disp: 90 tablet, Rfl: 3 .  hydrocortisone 2.5 % cream, Apply topically 3 (three) times daily as needed., Disp: 28 g, Rfl: 0 .  hydroxypropyl methylcellulose / hypromellose (ISOPTO TEARS / GONIOVISC) 2.5 % ophthalmic solution, Place 1 drop into both eyes daily as needed for dry eyes. , Disp: , Rfl:  .  ibuprofen (ADVIL)  200 MG tablet, Take 400 mg by mouth every 6 (six) hours as needed., Disp: , Rfl:  .  levothyroxine (SYNTHROID) 75 MCG tablet, Take 1 tablet (75 mcg total) by mouth daily., Disp: 90 tablet, Rfl: 3 .  lisinopril (ZESTRIL) 5 MG tablet, Take 1 tablet (5 mg total) by mouth daily., Disp: 90 tablet, Rfl: 3 .  Melatonin 10 MG TABS, Take 10 mg by mouth at bedtime., Disp: , Rfl:  .  pantoprazole (PROTONIX) 40 MG tablet, TAKE 1 TABLET BY MOUTH TWICE A DAY FOR 2 WEEKS, THEN TAKE 1 TABLET ONCE DAILY, Disp: 30 tablet, Rfl: 5 .  sertraline (ZOLOFT) 100 MG tablet, Take 1 tablet (100 mg total) by mouth daily., Disp: 90 tablet, Rfl: 3 .  sodium chloride (OCEAN) 0.65 % SOLN nasal spray, Place 1 spray into both nostrils daily as needed for congestion., Disp: , Rfl:  .  VALERIAN PO, Take 1 tablet by mouth daily., Disp: , Rfl:   EXAM:  VITALS per patient if applicable:  GENERAL: alert, oriented, appears well and in no acute distress  HEENT: atraumatic, conjunttiva clear, no obvious abnormalities on inspection of external nose and ears  NECK: normal movements of the head and neck  LUNGS: on inspection no signs of respiratory distress, breathing rate appears normal, no obvious gross SOB, gasping or wheezing  CV: no obvious cyanosis  MS: moves all visible extremities without noticeable abnormality  PSYCH/NEURO: pleasant and cooperative, no obvious depression or anxiety, speech and thought processing grossly intact  ASSESSMENT AND PLAN:  Discussed the following assessment and plan:  1. Acute otitis externa of left ear, unspecified type - suspected otitis externa  - NEOMYCIN-POLYMYXIN-HYDROCORTISONE (CORTISPORIN) 1 % SOLN OTIC solution; Place 3 drops into the left ear 4 (four) times daily. X 7 days  Dispense: 10 mL; Refill: 0  2. Acute recurrent pansinusitis  - amoxicillin (AMOXIL) 500 MG capsule; Take 1 capsule (500 mg total) by mouth 2 (two) times daily for 10 days.  Dispense: 20 capsule; Refill:  0  Follow up with PCP if no improvement in the next 2-3 days     I discussed the assessment and treatment plan with the patient. The patient was provided an opportunity to ask questions and all were answered. The patient agreed with the plan and demonstrated an understanding of the instructions.   The patient was advised to call back or seek an in-person evaluation if the symptoms worsen or if the condition fails to improve as anticipated.   Dorothyann Peng, NP

## 2020-02-14 ENCOUNTER — Ambulatory Visit: Payer: BC Managed Care – PPO | Admitting: Neurology

## 2020-03-30 ENCOUNTER — Other Ambulatory Visit: Payer: Self-pay | Admitting: Obstetrics and Gynecology

## 2020-03-30 ENCOUNTER — Telehealth: Payer: Self-pay

## 2020-03-30 ENCOUNTER — Ambulatory Visit
Admission: RE | Admit: 2020-03-30 | Discharge: 2020-03-30 | Disposition: A | Discharge status: Home / Self Care | Payer: Self-pay | Source: Ambulatory Visit | Attending: Obstetrics and Gynecology | Admitting: Obstetrics and Gynecology

## 2020-03-30 DIAGNOSIS — R7612 Nonspecific reaction to cell mediated immunity measurement of gamma interferon antigen response without active tuberculosis: Secondary | ICD-10-CM

## 2020-03-30 NOTE — Telephone Encounter (Signed)
Told Ms [name] that a copy of the CT report will be up front for her to pick up today for health at work.

## 2020-04-09 ENCOUNTER — Encounter: Payer: Self-pay | Admitting: Neurology

## 2020-04-09 ENCOUNTER — Ambulatory Visit (INDEPENDENT_AMBULATORY_CARE_PROVIDER_SITE_OTHER): Payer: Self-pay | Admitting: Neurology

## 2020-04-09 ENCOUNTER — Other Ambulatory Visit: Payer: Self-pay

## 2020-04-09 VITALS — BP 126/83 | HR 86 | Ht 68.0 in | Wt 313.8 lb

## 2020-04-09 DIAGNOSIS — G620 Drug-induced polyneuropathy: Secondary | ICD-10-CM

## 2020-04-09 DIAGNOSIS — G5603 Carpal tunnel syndrome, bilateral upper limbs: Secondary | ICD-10-CM

## 2020-04-09 DIAGNOSIS — T451X5A Adverse effect of antineoplastic and immunosuppressive drugs, initial encounter: Secondary | ICD-10-CM

## 2020-04-09 DIAGNOSIS — S39012A Strain of muscle, fascia and tendon of lower back, initial encounter: Secondary | ICD-10-CM

## 2020-04-09 NOTE — Patient Instructions (Addendum)
1.  Start physical therapy 2.  Adjust gabapentin to 600mg  in the morning, 600mg  in the afternoon, and 900mg  at bedtime 3.  Continue flexeril 5mg  daily 4.  Continue using wrist splints  5.  If symptoms get worse, please call the office to schedule nerve testing  Return to clinic in 6 months

## 2020-04-09 NOTE — Progress Notes (Signed)
Follow-up Visit   Date: 04/09/20    Seth Friedlander MRN: [id number] DOB: 05-31-1970   Interim History: [name] is a 49 y.o. right-handed Caucasian female with ovarian cancer s/p bilateral SPO and hysterectomy and chemotherapy (11/2014), hypothyroidism, endometriosis, migraines, and anxiety returning to the clinic for follow-up of chemotherapy-induced neuropathy, lumbar strain, and carpal tunnel syndrome.   She continues to have tingling in the hands, which is worse in the left hand. She is compliant with using wrist splints.  She no-showed her EMG visit.  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  She continues to have mid-back pain which is improved with flexeril.  She did not complete PT, as recommended.   Today, she complains of a localized area of burning pain over the dorsum of her lateral foot on the left.  She has pain with palpation.  She takes gabapentin 600mg  three times daily for neuropathy.    Medications:  Current Outpatient Medications on File Prior to Visit  Medication Sig Dispense Refill  . acetaminophen (TYLENOL) 650 MG CR tablet Take 1,300 mg by mouth daily as needed for pain.    Marland Kitchen ALPRAZolam (XANAX) 1 MG tablet Take 1 tablet (1 mg total) by mouth 2 (two) times daily as needed. for anxiety 60 tablet 2  . cyclobenzaprine (FLEXERIL) 5 MG tablet TAKE 1 TABLET (5 MG TOTAL) BY MOUTH EVERY 8 (EIGHT) HOURS AS NEEDED FOR MUSCLE SPASMS. 30 tablet 5  . estradiol (VIVELLE-DOT) 0.075 MG/24HR PLACE 1 PATCH ONTO THE SKIN 2 (TWO) TIMES A WEEK. 24 patch 2  . fluticasone (FLONASE) 50 MCG/ACT nasal spray Place 2 sprays into both nostrils daily. In each nostril 16 g 1  . gabapentin (NEURONTIN) 600 MG tablet Take 0.5 tablets (300 mg total) by mouth 3 (three) times daily. 45 tablet 1  . gabapentin (NEURONTIN) 600 MG tablet Take 1 tablet (600 mg total) by mouth 3 (three) times daily. 90 tablet 3  . hydrocortisone 2.5 % cream Apply topically 3 (three) times daily as  needed. 28 g 0  . hydroxypropyl methylcellulose / hypromellose (ISOPTO TEARS / GONIOVISC) 2.5 % ophthalmic solution Place 1 drop into both eyes daily as needed for dry eyes.     Marland Kitchen ibuprofen (ADVIL) 200 MG tablet Take 400 mg by mouth every 6 (six) hours as needed.    Marland Kitchen levothyroxine (SYNTHROID) 75 MCG tablet Take 1 tablet (75 mcg total) by mouth daily. 90 tablet 3  . lisinopril (ZESTRIL) 5 MG tablet Take 1 tablet (5 mg total) by mouth daily. 90 tablet 3  . Melatonin 10 MG TABS Take 10 mg by mouth at bedtime.    . NEOMYCIN-POLYMYXIN-HYDROCORTISONE (CORTISPORIN) 1 % SOLN OTIC solution Place 3 drops into the left ear 4 (four) times daily. X 7 days 10 mL 0  . pantoprazole (PROTONIX) 40 MG tablet TAKE 1 TABLET BY MOUTH TWICE A DAY FOR 2 WEEKS, THEN TAKE 1 TABLET ONCE DAILY 30 tablet 5  . sertraline (ZOLOFT) 100 MG tablet Take 1 tablet (100 mg total) by mouth daily. 90 tablet 3  . sodium chloride (OCEAN) 0.65 % SOLN nasal spray Place 1 spray into both nostrils daily as needed for congestion.    Marland Kitchen VALERIAN PO Take 1 tablet by mouth daily.     No current facility-administered medications on file prior to visit.    Allergies:  Allergies  Allergen Reactions  . Moxifloxacin Anaphylaxis    needs epinephrine shot  . Quinolones Anaphylaxis, Shortness Of Breath and Swelling  .  Doxycycline Nausea And Vomiting  . Escitalopram Oxalate Other (See Comments)     fatigue  . Sulfonamide Derivatives Swelling and Rash    needs epinephrine shot--rash and lip swelling    Vital Signs:  LMP 11/02/2014   Neurological Exam: MENTAL STATUS including orientation to time, place, person, recent and remote memory, attention span and concentration, language, and fund of knowledge is normal.  Speech is not dysarthric.  CRANIAL NERVES:   Extraocular muscular muscles intact.  No ptosis.    MOTOR:  Motor strength shows 5/5 motor strength throughout. Localized tenderness over the right lower back.   MSRs:  Right                                                                  Left brachioradialis 2+  brachioradialis 2+  biceps 2+  biceps 2+  triceps 1+  triceps 1+  Patellar 1+  Patellar 1+  ankle jerk 0  ankle jerk 0  plantar response down  plantar response down   SENSORY:  Absent temperature and vibration in the feet bilaterally, reduced in the lower legs.   COORDINATION/GAIT:   Gait wide-based and antalgic, stable.   Data: Lab Results  Component Value Date   TSH 5.12 (H) 10/16/2017   NCS/EMG of the upper extremities 11/24/2015:  Bilateral median neuropathy at or distal to the wrist, consistent with clinical diagnosis of carpal tunnel syndrome. Overall, these findings are moderate in degree electrically   IMPRESSION/PLAN: 1.  Bilateral carpal tunnel syndrome, stable  - Continue wrist splints  - NCS/EMG, if symptoms get worse  - Refer to hand specialist if symptoms have progressed on EDX  2.  Lumbar strain  - she did not go for physical therapy and expresses interest in starting this now.  Referral will be sent  - continue flexeril 5mg  at bedtime  3.  Right burning foot pain ?lumbar radiculopathy vs neuropathy vs other MSK pain.  Pain with palpation is atypical for neuropathy.  - increase night time gabapentin to 900mg   3.  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  - Continue gabapentin to 600mg  in the morning, 600mg  in the afternoon, and increase 900mg  at bedtime  4. Bilateral ovarian cancer s/p SPO and hysterectomy (11/2014) and chemotherapy  Return to clinic in 6 months  Thank you for allowing me to participate in patient's care.  If I can answer any additional questions, I would be pleased to do so.    Sincerely,    Wood Novacek K. Posey Pronto, DO

## 2020-04-12 ENCOUNTER — Other Ambulatory Visit: Payer: Self-pay | Admitting: Internal Medicine

## 2020-04-12 DIAGNOSIS — I10 Essential (primary) hypertension: Secondary | ICD-10-CM

## 2020-04-12 NOTE — Telephone Encounter (Signed)
Refill done 1 mo only  Please let pt know - due for rov for refills

## 2020-04-21 ENCOUNTER — Other Ambulatory Visit: Payer: Self-pay

## 2020-04-21 ENCOUNTER — Encounter: Payer: Self-pay | Admitting: Internal Medicine

## 2020-04-21 DIAGNOSIS — E8940 Asymptomatic postprocedural ovarian failure: Secondary | ICD-10-CM

## 2020-04-21 DIAGNOSIS — F419 Anxiety disorder, unspecified: Secondary | ICD-10-CM

## 2020-04-21 MED ORDER — ESTRADIOL 0.075 MG/24HR TD PTTW: 1.0000 | MEDICATED_PATCH | TRANSDERMAL | 0 refills | Status: DC

## 2020-04-22 ENCOUNTER — Other Ambulatory Visit: Payer: Self-pay | Admitting: Internal Medicine

## 2020-04-22 MED ORDER — ALPRAZOLAM 1 MG PO TABS: 1.0000 mg | ORAL_TABLET | Freq: Two times a day (BID) | ORAL | 0 refills | Status: DC | PRN

## 2020-05-01 ENCOUNTER — Other Ambulatory Visit: Payer: Self-pay | Admitting: Neurology

## 2020-05-09 ENCOUNTER — Other Ambulatory Visit: Payer: Self-pay | Admitting: Internal Medicine

## 2020-05-09 DIAGNOSIS — I10 Essential (primary) hypertension: Secondary | ICD-10-CM

## 2020-05-09 NOTE — Telephone Encounter (Signed)
Please refill as per office routine med refill policy (all routine meds refilled for 3 mo or monthly per pt preference up to one year from last visit, then month to month grace period for 3 mo, then further med refills will have to be denied)  

## 2020-05-10 NOTE — Telephone Encounter (Signed)
zoloft refilled 1 mo only  Please to contact pt - due for rov for further refills

## 2020-05-18 ENCOUNTER — Encounter: Payer: Self-pay | Admitting: Internal Medicine

## 2020-05-18 ENCOUNTER — Ambulatory Visit: Payer: 59 | Admitting: Internal Medicine

## 2020-05-18 ENCOUNTER — Other Ambulatory Visit: Payer: Self-pay | Admitting: Internal Medicine

## 2020-05-18 ENCOUNTER — Other Ambulatory Visit: Payer: Self-pay

## 2020-05-18 VITALS — BP 136/84 | HR 81 | Temp 98.1°F | Ht 68.0 in | Wt 317.6 lb

## 2020-05-18 DIAGNOSIS — E559 Vitamin D deficiency, unspecified: Secondary | ICD-10-CM

## 2020-05-18 DIAGNOSIS — Z1159 Encounter for screening for other viral diseases: Secondary | ICD-10-CM

## 2020-05-18 DIAGNOSIS — K429 Umbilical hernia without obstruction or gangrene: Secondary | ICD-10-CM

## 2020-05-18 DIAGNOSIS — G8929 Other chronic pain: Secondary | ICD-10-CM | POA: Diagnosis not present

## 2020-05-18 DIAGNOSIS — I10 Essential (primary) hypertension: Secondary | ICD-10-CM | POA: Diagnosis not present

## 2020-05-18 DIAGNOSIS — F3289 Other specified depressive episodes: Secondary | ICD-10-CM | POA: Diagnosis not present

## 2020-05-18 DIAGNOSIS — Z1211 Encounter for screening for malignant neoplasm of colon: Secondary | ICD-10-CM | POA: Diagnosis not present

## 2020-05-18 DIAGNOSIS — Z227 Latent tuberculosis: Secondary | ICD-10-CM

## 2020-05-18 DIAGNOSIS — M5416 Radiculopathy, lumbar region: Secondary | ICD-10-CM | POA: Diagnosis not present

## 2020-05-18 DIAGNOSIS — M25561 Pain in right knee: Secondary | ICD-10-CM | POA: Diagnosis not present

## 2020-05-18 DIAGNOSIS — E538 Deficiency of other specified B group vitamins: Secondary | ICD-10-CM

## 2020-05-18 DIAGNOSIS — Z1231 Encounter for screening mammogram for malignant neoplasm of breast: Secondary | ICD-10-CM

## 2020-05-18 DIAGNOSIS — R739 Hyperglycemia, unspecified: Secondary | ICD-10-CM

## 2020-05-18 DIAGNOSIS — Z0001 Encounter for general adult medical examination with abnormal findings: Secondary | ICD-10-CM | POA: Diagnosis not present

## 2020-05-18 DIAGNOSIS — E039 Hypothyroidism, unspecified: Secondary | ICD-10-CM

## 2020-05-18 MED ORDER — SERTRALINE HCL 100 MG PO TABS: 200.0000 mg | ORAL_TABLET | Freq: Every day | ORAL | 3 refills | Status: DC

## 2020-05-18 NOTE — Progress Notes (Signed)
Established Patient Office Visit  Subjective:  Patient ID: [name], female    DOB: 1970-05-16  Age: 50 y.o. MRN: [id number]        Chief Complaint:: wellness exam and Follow-up (F/u meds.  )       HPI:  [name] is a 50 y.o. female here for wellness exam  Due for routine labs, mammogram, colonoscpy.  Wt has been much improved with less calories.   Wt Readings from Last 3 Encounters:  05/18/20 (!) 317 lb 9.6 oz (144.1 kg)  04/09/20 (!) 313 lb 12.8 oz (142.3 kg)  12/06/19 (!) 331 lb 9.6 oz (150.4 kg)   BP Readings from Last 3 Encounters:  05/18/20 136/84  04/09/20 126/83  12/06/19 127/80        Also c/o acute problem of: Also c/o worsening mood over several social stressors, including deceased mothers bday, just graduated college and now what, and father with recent "optic nerve stroke".  Denies Si or HI, but asks for increased meds as some days not functioning well.  Also c/o worsening symptomatic umbilical hernia with intermittent discomfort, always reducible but more freqeunt and larger.  Also had new worsening right knee stiffness and girindg more frequenct and modearte over the past 2 months, but without swelling, giveaways or falls.  Also has new worsening right low back, mild to mod pain with radiation to the distal RLE with numb and mild weakness intermittent for several months as well that seems different from the knee pain.  Also has hx of Latent TB and wondering if she really has to see Infectious Disease or can we just deal with that today. No recent fever, night sweats, cough, or unintentional wt loss.    Past Medical History:  Diagnosis Date  . Anxiety   . Elevated blood pressure, situational 05/05/2016  . Endometriosis   . Exercise-induced asthma 09/08/2015  . Ovarian cancer, bilateral 11/28/2014  . PONV (postoperative nausea and vomiting)   . Thyroid disease    Past Surgical History:  Procedure Laterality Date  . ABDOMINAL HYSTERECTOMY  11/10/14   at [facility],  Exp lap, supracervical hyst, BSO, infracolic omentectomy, lymphadenectomy, aortic lymph node sampling  . CHOLECYSTECTOMY N/A 07/24/2018   Procedure: LAPAROSCOPIC CHOLECYSTECTOMY WITH INTRAOPERATIVE CHOLANGIOGRAM;  Surgeon: Armandina Gemma, MD;  Location: [facility];  Service: General;  Laterality: N/A;  . ERCP N/A 07/26/2018   Procedure: ENDOSCOPIC RETROGRADE CHOLANGIOPANCREATOGRAPHY (ERCP);  Surgeon: Ladene Artist, MD;  Location: [facility];  Service: Endoscopy;  Laterality: N/A;  . IR GENERIC HISTORICAL  05/12/2016   IR REMOVAL TUN ACCESS W/ PORT W/O FL MOD SED 05/12/2016 [facility]  . REMOVAL OF STONES  07/26/2018   Procedure: REMOVAL OF STONES;  Surgeon: Ladene Artist, MD;  Location: [facility];  Service: Endoscopy;;  . Joan Mayans  07/26/2018   Procedure: SPHINCTEROTOMY;  Surgeon: Ladene Artist, MD;  Location: [facility];  Service: Endoscopy;;    reports that she has never smoked. She has never used smokeless tobacco. She reports previous alcohol use. She reports that she does not use drugs. family history includes Atrial fibrillation in her father; Dementia in her paternal grandfather and paternal grandmother; Diabetes in her maternal aunt and mother; Healthy in her brother; Heart attack in her maternal grandfather; Heart failure in her maternal grandmother; Hypertension in her mother; Stroke in her mother. Allergies  Allergen Reactions  . Moxifloxacin Anaphylaxis    needs epinephrine shot  . Quinolones Anaphylaxis, Shortness Of Breath and Swelling  .  Doxycycline Nausea And Vomiting  . Escitalopram Oxalate Other (See Comments)     fatigue  . Sulfonamide Derivatives Swelling and Rash    needs epinephrine shot--rash and lip swelling   Current Outpatient Medications on File Prior to Visit  Medication Sig Dispense Refill  . ELDERBERRY PO Take by mouth.    . estradiol (VIVELLE-DOT) 0.075 MG/24HR Place 1 patch onto the skin 2 (two) times a week. 24 patch 0  . gabapentin (NEURONTIN)  600 MG tablet TAKE 1 TABLET BY MOUTH THREE TIMES A DAY 90 tablet 1  . hydrocortisone 2.5 % cream Apply topically 3 (three) times daily as needed. 28 g 0  . ibuprofen (ADVIL) 200 MG tablet Take 400 mg by mouth every 6 (six) hours as needed.    . isoniazid (NYDRAZID) 300 MG tablet Take by mouth daily.    Marland Kitchen levothyroxine (SYNTHROID) 75 MCG tablet TAKE 1 TABLET BY MOUTH EVERY DAY 30 tablet 0  . lisinopril (ZESTRIL) 5 MG tablet TAKE 1 TABLET BY MOUTH EVERY DAY 30 tablet 0  . Melatonin 10 MG TABS Take 10 mg by mouth at bedtime.    . pantoprazole (PROTONIX) 40 MG tablet TAKE 1 TABLET BY MOUTH TWICE A DAY FOR 2 WEEKS, THEN TAKE 1 TABLET ONCE DAILY 30 tablet 5  . pyridOXINE (VITAMIN B-6) 50 MG tablet Take 50 mg by mouth daily.    . sodium chloride (OCEAN) 0.65 % SOLN nasal spray Place 1 spray into both nostrils daily as needed for congestion.    Marland Kitchen VALERIAN PO Take 1 tablet by mouth daily.    Marland Kitchen acetaminophen (TYLENOL) 650 MG CR tablet Take 1,300 mg by mouth daily as needed for pain. (Patient not taking: Reported on 05/18/2020)    . fluticasone (FLONASE) 50 MCG/ACT nasal spray Place 2 sprays into both nostrils daily. In each nostril (Patient not taking: No sig reported) 16 g 1  . hydroxypropyl methylcellulose / hypromellose (ISOPTO TEARS / GONIOVISC) 2.5 % ophthalmic solution Place 1 drop into both eyes daily as needed for dry eyes.  (Patient not taking: No sig reported)     No current facility-administered medications on file prior to visit.        ROS:  All others reviewed and negative.  Objective        PE:  BP 136/84   Pulse 81   Temp 98.1 F (36.7 C) (Oral)   Ht '5\' 8"'  (1.727 m)   Wt (!) 317 lb 9.6 oz (144.1 kg)   LMP 11/02/2014   SpO2 98%   BMI 48.29 kg/m                 Constitutional: Pt appears in NAD               HENT: Head: NCAT.                Right Ear: External ear normal.                 Left Ear: External ear normal.                Eyes: . Pupils are equal, round, and reactive  to light. Conjunctivae and EOM are normal               Nose: without d/c or deformity               Neck: Neck supple. Gross normal ROM  Cardiovascular: Normal rate and regular rhythm.                 Pulmonary/Chest: Effort normal and breath sounds without rales or wheezing.                Abd:  Soft, NT, ND, + BS, no organomegaly with reducible slight tender periumbilical hernia               Neurological: Pt is alert. At baseline orientation, motor grossly intact except for 4+/5 RLE weakness               Right knee with crepitus and FROM, but no effusion               Skin: Skin is warm. No rashes, no other new lesions, LE edema -                Psychiatric: Pt behavior is normal without agitation , but with depressed affect  Assessment/Plan:  [name] is a 50 y.o. White or Caucasian [1] female with  has a past medical history of Anxiety, Elevated blood pressure, situational (05/05/2016), Endometriosis, Exercise-induced asthma (09/08/2015), Ovarian cancer, bilateral (11/28/2014), PONV (postoperative nausea and vomiting), and Thyroid disease.  Assessment Plan  See notes Labs reviewed for each problem: Lab Results  Component Value Date   WBC 7.0 07/29/2018   HGB 12.7 07/29/2018   HCT 38.6 07/29/2018   PLT 136 (L) 07/29/2018   GLUCOSE 128 (H) 07/19/2019   CHOL 171 10/16/2017   TRIG 97.0 10/16/2017   HDL 39.10 10/16/2017   LDLCALC 112 (H) 10/16/2017   ALT 254 (H) 07/29/2018   AST 240 (H) 07/29/2018   NA 141 07/19/2019   K 3.9 07/19/2019   CL 105 07/19/2019   CREATININE 0.81 07/19/2019   BUN 9 07/19/2019   CO2 27 07/19/2019   TSH 5.12 (H) 10/16/2017   INR 0.90 05/12/2016   HGBA1C 5.7 10/16/2017    Micro: none  Cardiac tracings I have personally interpreted today:  none  Pertinent Radiological findings (summarize): cxr 11-22- 2021 IMPRESSION: No active cardiopulmonary disease.   Health Maintenance Due  Topic Date Due  . Hepatitis C Screening  Never  done  . COLONOSCOPY (Pts 45-46yr Insurance coverage will need to be confirmed)  Never done  . MAMMOGRAM  11/16/2016    There are no preventive care reminders to display for this patient.  Lab Results  Component Value Date   TSH 5.12 (H) 10/16/2017   Lab Results  Component Value Date   WBC 7.0 07/29/2018   HGB 12.7 07/29/2018   HCT 38.6 07/29/2018   MCV 90.4 07/29/2018   PLT 136 (L) 07/29/2018   Lab Results  Component Value Date   NA 141 07/19/2019   K 3.9 07/19/2019   CHLORIDE 104 04/21/2016   CO2 27 07/19/2019   GLUCOSE 128 (H) 07/19/2019   BUN 9 07/19/2019   CREATININE 0.81 07/19/2019   BILITOT 2.1 (H) 07/29/2018   ALKPHOS 150 (H) 07/29/2018   AST 240 (H) 07/29/2018   ALT 254 (H) 07/29/2018   PROT 6.3 (L) 07/29/2018   ALBUMIN 3.2 (L) 07/29/2018   CALCIUM 9.3 07/19/2019   ANIONGAP 9 07/19/2019   EGFR 89 (L) 04/21/2016   GFR 93.98 10/16/2017   Lab Results  Component Value Date   CHOL 171 10/16/2017   Lab Results  Component Value Date   HDL 39.10 10/16/2017   Lab Results  Component Value Date   LDLCALC  112 (H) 10/16/2017   Lab Results  Component Value Date   TRIG 97.0 10/16/2017   Lab Results  Component Value Date   CHOLHDL 4 10/16/2017   Lab Results  Component Value Date   HGBA1C 5.7 10/16/2017     Assessment & Plan:   Problem List Items Addressed This Visit      High   Encounter for well adult exam with abnormal findings - Primary    Overall doing well, age appropriate education and counseling updated, referrals for preventative services and immunizations addressed, dietary and smoking counseling addressed, most recent labs reviewed.  I have personally reviewed and have noted:  1) the patient's medical and social history 2) The pt's use of alcohol, tobacco, and illicit drugs 3) The patient's current medications and supplements 4) Functional ability including ADL's, fall risk, home safety risk, hearing and visual impairment 5) Diet and  physical activities 6) Evidence for depression or mood disorder 7) The patient's height, weight, and BMI have been recorded in the chart  I have made referrals, and provided counseling and education based on review of the above       Relevant Orders   Lipid panel   Hepatic function panel   CBC with Differential/Platelet   TSH   Urinalysis, Routine w reflex microscopic   Basic metabolic panel     Medium   TB lung, latent    This is normally beyond the scope of practice here, recent cxr neg, pt should cont f/u with ID as planned      Right lumbar radiculopathy    New mild worsening with neuro change noted - for MRI and possible surgical referral      Relevant Medications   sertraline (ZOLOFT) 100 MG tablet   Other Relevant Orders   MR Lumbar Spine Wo Contrast   Ambulatory referral to Sports Medicine   Right knee pain    Recent mild worsening - for sport med referral      Relevant Orders   Ambulatory referral to Sports Medicine   Hypothyroidism    Lab Results  Component Value Date   TSH 5.12 (H) 10/16/2017   Stable, pt to continue levothyroxine  Current Outpatient Medications (Endocrine & Metabolic):  .  estradiol (VIVELLE-DOT) 0.075 MG/24HR, Place 1 patch onto the skin 2 (two) times a week. .  levothyroxine (SYNTHROID) 75 MCG tablet, TAKE 1 TABLET BY MOUTH EVERY DAY  Current Outpatient Medications (Cardiovascular):  .  lisinopril (ZESTRIL) 5 MG tablet, TAKE 1 TABLET BY MOUTH EVERY DAY  Current Outpatient Medications (Respiratory):  .  sodium chloride (OCEAN) 0.65 % SOLN nasal spray, Place 1 spray into both nostrils daily as needed for congestion. .  fluticasone (FLONASE) 50 MCG/ACT nasal spray, Place 2 sprays into both nostrils daily. In each nostril (Patient not taking: No sig reported)  Current Outpatient Medications (Analgesics):  .  ibuprofen (ADVIL) 200 MG tablet, Take 400 mg by mouth every 6 (six) hours as needed. Marland Kitchen  acetaminophen (TYLENOL) 650 MG CR  tablet, Take 1,300 mg by mouth daily as needed for pain. (Patient not taking: Reported on 05/18/2020)   Current Outpatient Medications (Other):  Marland Kitchen  ELDERBERRY PO, Take by mouth. .  gabapentin (NEURONTIN) 600 MG tablet, TAKE 1 TABLET BY MOUTH THREE TIMES A DAY .  hydrocortisone 2.5 % cream, Apply topically 3 (three) times daily as needed. .  isoniazid (NYDRAZID) 300 MG tablet, Take by mouth daily. .  Melatonin 10 MG TABS, Take 10 mg by mouth  at bedtime. .  pantoprazole (PROTONIX) 40 MG tablet, TAKE 1 TABLET BY MOUTH TWICE A DAY FOR 2 WEEKS, THEN TAKE 1 TABLET ONCE DAILY .  pyridOXINE (VITAMIN B-6) 50 MG tablet, Take 50 mg by mouth daily. Marland Kitchen  VALERIAN PO, Take 1 tablet by mouth daily. Marland Kitchen  ALPRAZolam (XANAX) 1 MG tablet, TAKE 1 TABLET (1 MG TOTAL) BY MOUTH 2 (TWO) TIMES DAILY AS NEEDED. FOR ANXIETY .  cyclobenzaprine (FLEXERIL) 5 MG tablet, TAKE 1 TABLET (5 MG TOTAL) BY MOUTH EVERY 8 (EIGHT) HOURS AS NEEDED FOR MUSCLE SPASMS. .  hydroxypropyl methylcellulose / hypromellose (ISOPTO TEARS / GONIOVISC) 2.5 % ophthalmic solution, Place 1 drop into both eyes daily as needed for dry eyes.  (Patient not taking: No sig reported) .  sertraline (ZOLOFT) 100 MG tablet, Take 2 tablets (200 mg total) by mouth daily.      Hyperglycemia    Lab Results  Component Value Date   HGBA1C 5.7 10/16/2017   Stable, pt to continue current medical treatment - diet       Relevant Orders   Hemoglobin A1c   HTN (hypertension)    . BP Readings from Last 3 Encounters:  05/18/20 136/84  04/09/20 126/83  12/06/19 127/80   Stable, pt to continue medical treatment  lisinopril  Current Outpatient Medications (Endocrine & Metabolic):  .  estradiol (VIVELLE-DOT) 0.075 MG/24HR, Place 1 patch onto the skin 2 (two) times a week. .  levothyroxine (SYNTHROID) 75 MCG tablet, TAKE 1 TABLET BY MOUTH EVERY DAY  Current Outpatient Medications (Cardiovascular):  .  lisinopril (ZESTRIL) 5 MG tablet, TAKE 1 TABLET BY MOUTH EVERY  DAY  Current Outpatient Medications (Respiratory):  .  sodium chloride (OCEAN) 0.65 % SOLN nasal spray, Place 1 spray into both nostrils daily as needed for congestion. .  fluticasone (FLONASE) 50 MCG/ACT nasal spray, Place 2 sprays into both nostrils daily. In each nostril (Patient not taking: No sig reported)  Current Outpatient Medications (Analgesics):  .  ibuprofen (ADVIL) 200 MG tablet, Take 400 mg by mouth every 6 (six) hours as needed. Marland Kitchen  acetaminophen (TYLENOL) 650 MG CR tablet, Take 1,300 mg by mouth daily as needed for pain. (Patient not taking: Reported on 05/18/2020)   Current Outpatient Medications (Other):  Marland Kitchen  ELDERBERRY PO, Take by mouth. .  gabapentin (NEURONTIN) 600 MG tablet, TAKE 1 TABLET BY MOUTH THREE TIMES A DAY .  hydrocortisone 2.5 % cream, Apply topically 3 (three) times daily as needed. .  isoniazid (NYDRAZID) 300 MG tablet, Take by mouth daily. .  Melatonin 10 MG TABS, Take 10 mg by mouth at bedtime. .  pantoprazole (PROTONIX) 40 MG tablet, TAKE 1 TABLET BY MOUTH TWICE A DAY FOR 2 WEEKS, THEN TAKE 1 TABLET ONCE DAILY .  pyridOXINE (VITAMIN B-6) 50 MG tablet, Take 50 mg by mouth daily. Marland Kitchen  VALERIAN PO, Take 1 tablet by mouth daily. Marland Kitchen  ALPRAZolam (XANAX) 1 MG tablet, TAKE 1 TABLET (1 MG TOTAL) BY MOUTH 2 (TWO) TIMES DAILY AS NEEDED. FOR ANXIETY .  cyclobenzaprine (FLEXERIL) 5 MG tablet, TAKE 1 TABLET (5 MG TOTAL) BY MOUTH EVERY 8 (EIGHT) HOURS AS NEEDED FOR MUSCLE SPASMS. .  hydroxypropyl methylcellulose / hypromellose (ISOPTO TEARS / GONIOVISC) 2.5 % ophthalmic solution, Place 1 drop into both eyes daily as needed for dry eyes.  (Patient not taking: No sig reported) .  sertraline (ZOLOFT) 100 MG tablet, Take 2 tablets (200 mg total) by mouth daily.       Depression  Mild to mod worsening, no SI or HI, declines referral behavioral health, ok for increased zoloft 200 qd      Relevant Medications   sertraline (ZOLOFT) 100 MG tablet     Low   Periumbilical  hernia    New recent mild symptomatically worsening- for general surgury referral      Relevant Orders   Ambulatory referral to General Surgery    Other Visit Diagnoses    B12 deficiency       Relevant Orders   Vitamin B12   Vitamin D deficiency       Relevant Orders   VITAMIN D 25 Hydroxy (Vit-D Deficiency, Fractures)   Encounter for screening mammogram for malignant neoplasm of breast       Relevant Orders   MM Digital Screening   Screen for colon cancer       Relevant Orders   Ambulatory referral to Gastroenterology   Need for hepatitis C screening test       Relevant Orders   Hepatitis C Antibody      Meds ordered this encounter  Medications  . sertraline (ZOLOFT) 100 MG tablet    Sig: Take 2 tablets (200 mg total) by mouth daily.    Dispense:  180 tablet    Refill:  3    Follow-up: Return in about 6 months (around 11/15/2020).    [name], MD

## 2020-05-18 NOTE — Patient Instructions (Addendum)
Ok to increase the zoloft to 200 mg per day  You will be contacted regarding the referral for: MRI, sports medicine, and general surgury  You will be contacted regarding the referral for: mammogram, and colonoscopy  Please continue all other medications as before, and refills have been done if requested.  Please have the pharmacy call with any other refills you may need.  Please continue your efforts at being more active, low cholesterol diet, and weight control.  You are otherwise up to date with prevention measures today.  Please keep your appointments with your specialists as you may have planned  Please go to the LAB at the blood drawing area for the tests to be done  You will be contacted by phone if any changes need to be made immediately.  Otherwise, you will receive a letter about your results with an explanation, but please check with MyChart first.  Please remember to sign up for MyChart if you have not done so, as this will be important to you in the future with finding out test results, communicating by private email, and scheduling acute appointments online when needed.  Please make an Appointment to return in 6 months, or sooner if needed

## 2020-05-19 ENCOUNTER — Other Ambulatory Visit: Payer: Self-pay | Admitting: Internal Medicine

## 2020-05-19 DIAGNOSIS — F419 Anxiety disorder, unspecified: Secondary | ICD-10-CM

## 2020-05-24 ENCOUNTER — Other Ambulatory Visit: Payer: Self-pay | Admitting: Neurology

## 2020-05-25 ENCOUNTER — Encounter: Payer: Self-pay | Admitting: Internal Medicine

## 2020-05-25 NOTE — Assessment & Plan Note (Signed)
Mild to mod worsening, no SI or HI, declines referral behavioral health, ok for increased zoloft 200 qd

## 2020-05-25 NOTE — Assessment & Plan Note (Signed)
New mild worsening with neuro change noted - for MRI and possible surgical referral

## 2020-05-25 NOTE — Assessment & Plan Note (Signed)

## 2020-05-25 NOTE — Assessment & Plan Note (Signed)
Lab Results  Component Value Date   HGBA1C 5.7 10/16/2017   Stable, pt to continue current medical treatment  - diet  

## 2020-05-25 NOTE — Assessment & Plan Note (Signed)
.   BP Readings from Last 3 Encounters:  05/18/20 136/84  04/09/20 126/83  12/06/19 127/80   Stable, pt to continue medical treatment  lisinopril  Current Outpatient Medications (Endocrine & Metabolic):  .  estradiol (VIVELLE-DOT) 0.075 MG/24HR, Place 1 patch onto the skin 2 (two) times a week. .  levothyroxine (SYNTHROID) 75 MCG tablet, TAKE 1 TABLET BY MOUTH EVERY DAY  Current Outpatient Medications (Cardiovascular):  .  lisinopril (ZESTRIL) 5 MG tablet, TAKE 1 TABLET BY MOUTH EVERY DAY  Current Outpatient Medications (Respiratory):  .  sodium chloride (OCEAN) 0.65 % SOLN nasal spray, Place 1 spray into both nostrils daily as needed for congestion. .  fluticasone (FLONASE) 50 MCG/ACT nasal spray, Place 2 sprays into both nostrils daily. In each nostril (Patient not taking: No sig reported)  Current Outpatient Medications (Analgesics):  .  ibuprofen (ADVIL) 200 MG tablet, Take 400 mg by mouth every 6 (six) hours as needed. Marland Kitchen  acetaminophen (TYLENOL) 650 MG CR tablet, Take 1,300 mg by mouth daily as needed for pain. (Patient not taking: Reported on 05/18/2020)   Current Outpatient Medications (Other):  Marland Kitchen  ELDERBERRY PO, Take by mouth. .  gabapentin (NEURONTIN) 600 MG tablet, TAKE 1 TABLET BY MOUTH THREE TIMES A DAY .  hydrocortisone 2.5 % cream, Apply topically 3 (three) times daily as needed. .  isoniazid (NYDRAZID) 300 MG tablet, Take by mouth daily. .  Melatonin 10 MG TABS, Take 10 mg by mouth at bedtime. .  pantoprazole (PROTONIX) 40 MG tablet, TAKE 1 TABLET BY MOUTH TWICE A DAY FOR 2 WEEKS, THEN TAKE 1 TABLET ONCE DAILY .  pyridOXINE (VITAMIN B-6) 50 MG tablet, Take 50 mg by mouth daily. Marland Kitchen  VALERIAN PO, Take 1 tablet by mouth daily. Marland Kitchen  ALPRAZolam (XANAX) 1 MG tablet, TAKE 1 TABLET (1 MG TOTAL) BY MOUTH 2 (TWO) TIMES DAILY AS NEEDED. FOR ANXIETY .  cyclobenzaprine (FLEXERIL) 5 MG tablet, TAKE 1 TABLET (5 MG TOTAL) BY MOUTH EVERY 8 (EIGHT) HOURS AS NEEDED FOR MUSCLE SPASMS. .   hydroxypropyl methylcellulose / hypromellose (ISOPTO TEARS / GONIOVISC) 2.5 % ophthalmic solution, Place 1 drop into both eyes daily as needed for dry eyes.  (Patient not taking: No sig reported) .  sertraline (ZOLOFT) 100 MG tablet, Take 2 tablets (200 mg total) by mouth daily.

## 2020-05-25 NOTE — Assessment & Plan Note (Signed)
Lab Results  Component Value Date   TSH 5.12 (H) 10/16/2017   Stable, pt to continue levothyroxine  Current Outpatient Medications (Endocrine & Metabolic):  .  estradiol (VIVELLE-DOT) 0.075 MG/24HR, Place 1 patch onto the skin 2 (two) times a week. .  levothyroxine (SYNTHROID) 75 MCG tablet, TAKE 1 TABLET BY MOUTH EVERY DAY  Current Outpatient Medications (Cardiovascular):  .  lisinopril (ZESTRIL) 5 MG tablet, TAKE 1 TABLET BY MOUTH EVERY DAY  Current Outpatient Medications (Respiratory):  .  sodium chloride (OCEAN) 0.65 % SOLN nasal spray, Place 1 spray into both nostrils daily as needed for congestion. .  fluticasone (FLONASE) 50 MCG/ACT nasal spray, Place 2 sprays into both nostrils daily. In each nostril (Patient not taking: No sig reported)  Current Outpatient Medications (Analgesics):  .  ibuprofen (ADVIL) 200 MG tablet, Take 400 mg by mouth every 6 (six) hours as needed. Marland Kitchen  acetaminophen (TYLENOL) 650 MG CR tablet, Take 1,300 mg by mouth daily as needed for pain. (Patient not taking: Reported on 05/18/2020)   Current Outpatient Medications (Other):  Marland Kitchen  ELDERBERRY PO, Take by mouth. .  gabapentin (NEURONTIN) 600 MG tablet, TAKE 1 TABLET BY MOUTH THREE TIMES A DAY .  hydrocortisone 2.5 % cream, Apply topically 3 (three) times daily as needed. .  isoniazid (NYDRAZID) 300 MG tablet, Take by mouth daily. .  Melatonin 10 MG TABS, Take 10 mg by mouth at bedtime. .  pantoprazole (PROTONIX) 40 MG tablet, TAKE 1 TABLET BY MOUTH TWICE A DAY FOR 2 WEEKS, THEN TAKE 1 TABLET ONCE DAILY .  pyridOXINE (VITAMIN B-6) 50 MG tablet, Take 50 mg by mouth daily. Marland Kitchen  VALERIAN PO, Take 1 tablet by mouth daily. Marland Kitchen  ALPRAZolam (XANAX) 1 MG tablet, TAKE 1 TABLET (1 MG TOTAL) BY MOUTH 2 (TWO) TIMES DAILY AS NEEDED. FOR ANXIETY .  cyclobenzaprine (FLEXERIL) 5 MG tablet, TAKE 1 TABLET (5 MG TOTAL) BY MOUTH EVERY 8 (EIGHT) HOURS AS NEEDED FOR MUSCLE SPASMS. .  hydroxypropyl methylcellulose / hypromellose  (ISOPTO TEARS / GONIOVISC) 2.5 % ophthalmic solution, Place 1 drop into both eyes daily as needed for dry eyes.  (Patient not taking: No sig reported) .  sertraline (ZOLOFT) 100 MG tablet, Take 2 tablets (200 mg total) by mouth daily.

## 2020-05-25 NOTE — Assessment & Plan Note (Signed)
Recent mild worsening - for sport med referral

## 2020-05-25 NOTE — Assessment & Plan Note (Signed)
New recent mild symptomatically worsening- for general surgury referral

## 2020-05-25 NOTE — Assessment & Plan Note (Addendum)
This is normally beyond the scope of practice here, recent cxr neg, pt should cont f/u with ID as planned

## 2020-05-27 ENCOUNTER — Telehealth: Payer: Self-pay

## 2020-05-27 NOTE — Telephone Encounter (Signed)
LM for Ms [name] to call Dr. Serita Grit office to schedule an appointment with Dr. Denman George. She needs to be seen prior to refill of Estradiol patch.

## 2020-05-28 NOTE — Telephone Encounter (Signed)
Spoke with pharmacist and told them that Ms [name] needs to make an appointment for follow up prior to a refill of estradiol 0.075 mg patch. Pharmacist verbalized understanding.

## 2020-06-08 ENCOUNTER — Other Ambulatory Visit: Payer: Self-pay | Admitting: Internal Medicine

## 2020-06-08 DIAGNOSIS — I10 Essential (primary) hypertension: Secondary | ICD-10-CM

## 2020-06-08 NOTE — Telephone Encounter (Signed)
Please refill as per office routine med refill policy (all routine meds refilled for 3 mo or monthly per pt preference up to one year from last visit, then month to month grace period for 3 mo, then further med refills will have to be denied)  

## 2020-06-09 ENCOUNTER — Encounter: Payer: Self-pay | Admitting: Internal Medicine

## 2020-06-09 ENCOUNTER — Other Ambulatory Visit: Payer: Self-pay | Admitting: Internal Medicine

## 2020-06-10 ENCOUNTER — Telehealth (INDEPENDENT_AMBULATORY_CARE_PROVIDER_SITE_OTHER): Payer: 59 | Admitting: Internal Medicine

## 2020-06-10 ENCOUNTER — Other Ambulatory Visit: Payer: Self-pay | Admitting: Internal Medicine

## 2020-06-10 DIAGNOSIS — B349 Viral infection, unspecified: Secondary | ICD-10-CM | POA: Diagnosis not present

## 2020-06-10 DIAGNOSIS — R739 Hyperglycemia, unspecified: Secondary | ICD-10-CM | POA: Diagnosis not present

## 2020-06-10 DIAGNOSIS — J309 Allergic rhinitis, unspecified: Secondary | ICD-10-CM | POA: Diagnosis not present

## 2020-06-10 MED ORDER — HYDROCODONE-HOMATROPINE 5-1.5 MG/5ML PO SYRP: 5.0000 mL | ORAL_SOLUTION | Freq: Four times a day (QID) | ORAL | 0 refills | Status: DC | PRN

## 2020-06-10 MED ORDER — ONDANSETRON HCL 4 MG PO TABS: 4.0000 mg | ORAL_TABLET | Freq: Three times a day (TID) | ORAL | 0 refills | Status: DC | PRN

## 2020-06-10 MED ORDER — ALBUTEROL SULFATE HFA 108 (90 BASE) MCG/ACT IN AERS: 2.0000 | INHALATION_SPRAY | Freq: Four times a day (QID) | RESPIRATORY_TRACT | 0 refills | Status: DC | PRN

## 2020-06-10 NOTE — Progress Notes (Signed)
Patient ID: [name], female   DOB: Apr 26, 1971, 50 y.o.   MRN: [id number]  Virtual Visit via Video Note  I connected with [name] on 06/14/20 at  2:00 PM EST by a video enabled telemedicine application and verified that I am speaking with the correct person using two identifiers.  Location of all participants today Patient: at home Provider: at office   I discussed the limitations of evaluation and management by telemedicine and the availability of in person appointments. The patient expressed understanding and agreed to proceed.  History of Present Illness: Here to f/u, father currently in ICU with covid pna in [place], pt saw him last Friday, but also may have been exposed to covid at work as well maybe jan 21; pt symptoms started jan 31 with uri like symtpoms after seeing father jan 28.  Home covid tested neg yesterday.  Does also have subq nodules like rash tender with redness to the arms below the elbows and legs below the knees; also a knot developed to right angle of jaw; denies fever, ST but has mild nasal/sinus congestion, slight nasuea, and dizzy woozy at times.    Pt denies polydipsia, polyuria, Denies recent worsening allergy symptoms  Past Medical History:  Diagnosis Date  . Anxiety   . Elevated blood pressure, situational 05/05/2016  . Endometriosis   . Exercise-induced asthma 09/08/2015  . Ovarian cancer, bilateral 11/28/2014  . PONV (postoperative nausea and vomiting)   . Thyroid disease    Past Surgical History:  Procedure Laterality Date  . ABDOMINAL HYSTERECTOMY  11/10/14   at [facility], Exp lap, supracervical hyst, BSO, infracolic omentectomy, lymphadenectomy, aortic lymph node sampling  . CHOLECYSTECTOMY N/A 07/24/2018   Procedure: LAPAROSCOPIC CHOLECYSTECTOMY WITH INTRAOPERATIVE CHOLANGIOGRAM;  Surgeon: Armandina Gemma, MD;  Location: [facility];  Service: General;  Laterality: N/A;  . ERCP N/A 07/26/2018   Procedure: ENDOSCOPIC RETROGRADE CHOLANGIOPANCREATOGRAPHY  (ERCP);  Surgeon: Ladene Artist, MD;  Location: [facility];  Service: Endoscopy;  Laterality: N/A;  . IR GENERIC HISTORICAL  05/12/2016   IR REMOVAL TUN ACCESS W/ PORT W/O FL MOD SED 05/12/2016 [facility]  . REMOVAL OF STONES  07/26/2018   Procedure: REMOVAL OF STONES;  Surgeon: Ladene Artist, MD;  Location: [facility];  Service: Endoscopy;;  . Joan Mayans  07/26/2018   Procedure: SPHINCTEROTOMY;  Surgeon: Ladene Artist, MD;  Location: [facility];  Service: Endoscopy;;    reports that she has never smoked. She has never used smokeless tobacco. She reports previous alcohol use. She reports that she does not use drugs. family history includes Atrial fibrillation in her father; Dementia in her paternal grandfather and paternal grandmother; Diabetes in her maternal aunt and mother; Healthy in her brother; Heart attack in her maternal grandfather; Heart failure in her maternal grandmother; Hypertension in her mother; Stroke in her mother. Allergies  Allergen Reactions  . Moxifloxacin Anaphylaxis    needs epinephrine shot  . Quinolones Anaphylaxis, Shortness Of Breath and Swelling  . Doxycycline Nausea And Vomiting  . Escitalopram Oxalate Other (See Comments)     fatigue  . Sulfonamide Derivatives Swelling and Rash    needs epinephrine shot--rash and lip swelling   Current Outpatient Medications on File Prior to Visit  Medication Sig Dispense Refill  . acetaminophen (TYLENOL) 650 MG CR tablet Take 1,300 mg by mouth daily as needed for pain. (Patient not taking: Reported on 05/18/2020)    . ALPRAZolam (XANAX) 1 MG tablet TAKE 1 TABLET (1 MG TOTAL) BY MOUTH 2 (  TWO) TIMES DAILY AS NEEDED. FOR ANXIETY 60 tablet 2  . cyclobenzaprine (FLEXERIL) 5 MG tablet TAKE 1 TABLET (5 MG TOTAL) BY MOUTH EVERY 8 (EIGHT) HOURS AS NEEDED FOR MUSCLE SPASMS. 30 tablet 5  . ELDERBERRY PO Take by mouth.    . estradiol (VIVELLE-DOT) 0.075 MG/24HR Place 1 patch onto the skin 2 (two) times a week. 24 patch 0   . fluticasone (FLONASE) 50 MCG/ACT nasal spray Place 2 sprays into both nostrils daily. In each nostril (Patient not taking: No sig reported) 16 g 1  . gabapentin (NEURONTIN) 600 MG tablet TAKE 1 TABLET BY MOUTH THREE TIMES A DAY 90 tablet 1  . hydrocortisone 2.5 % cream Apply topically 3 (three) times daily as needed. 28 g 0  . hydroxypropyl methylcellulose / hypromellose (ISOPTO TEARS / GONIOVISC) 2.5 % ophthalmic solution Place 1 drop into both eyes daily as needed for dry eyes.  (Patient not taking: No sig reported)    . ibuprofen (ADVIL) 200 MG tablet Take 400 mg by mouth every 6 (six) hours as needed.    . isoniazid (NYDRAZID) 300 MG tablet Take by mouth daily.    Marland Kitchen levothyroxine (SYNTHROID) 75 MCG tablet TAKE 1 TABLET BY MOUTH EVERY DAY 30 tablet 0  . lisinopril (ZESTRIL) 5 MG tablet TAKE 1 TABLET BY MOUTH EVERY DAY 30 tablet 0  . Melatonin 10 MG TABS Take 10 mg by mouth at bedtime.    . pantoprazole (PROTONIX) 40 MG tablet TAKE 1 TABLET BY MOUTH TWICE A DAY FOR 2 WEEKS, THEN TAKE 1 TABLET ONCE DAILY 30 tablet 5  . pyridOXINE (VITAMIN B-6) 50 MG tablet Take 50 mg by mouth daily.    . sertraline (ZOLOFT) 100 MG tablet TAKE 1 TABLET BY MOUTH EVERY DAY 90 tablet 3  . sodium chloride (OCEAN) 0.65 % SOLN nasal spray Place 1 spray into both nostrils daily as needed for congestion.    Marland Kitchen VALERIAN PO Take 1 tablet by mouth daily.     No current facility-administered medications on file prior to visit.    Observations/Objective: Alert, NAD, appropriate mood and affect, resps normal, cn 2-12 intact, moves all 4s, no visible rash or swelling Lab Results  Component Value Date   WBC 7.0 07/29/2018   HGB 12.7 07/29/2018   HCT 38.6 07/29/2018   PLT 136 (L) 07/29/2018   GLUCOSE 128 (H) 07/19/2019   CHOL 171 10/16/2017   TRIG 97.0 10/16/2017   HDL 39.10 10/16/2017   LDLCALC 112 (H) 10/16/2017   ALT 254 (H) 07/29/2018   AST 240 (H) 07/29/2018   NA 141 07/19/2019   K 3.9 07/19/2019   CL 105  07/19/2019   CREATININE 0.81 07/19/2019   BUN 9 07/19/2019   CO2 27 07/19/2019   TSH 5.12 (H) 10/16/2017   INR 0.90 05/12/2016   HGBA1C 5.7 10/16/2017   Assessment and Plan: See notes  Follow Up Instructions: See notes   I discussed the assessment and treatment plan with the patient. The patient was provided an opportunity to ask questions and all were answered. The patient agreed with the plan and demonstrated an understanding of the instructions.   The patient was advised to call back or seek an in-person evaluation if the symptoms worsen or if the condition fails to improve as anticipated.   [name], MD

## 2020-06-12 ENCOUNTER — Telehealth: Payer: Self-pay | Admitting: Internal Medicine

## 2020-06-12 DIAGNOSIS — R059 Cough, unspecified: Secondary | ICD-10-CM

## 2020-06-12 NOTE — Telephone Encounter (Signed)
Patient said Dr. [name] wanted her to get back to him after she had another covid test and she got it and it was negative again so she wasn't sure if he wanted to do a chest xray or lab test because the lymph nodes in her arms are still swollen

## 2020-06-13 NOTE — Telephone Encounter (Signed)
Ok to contact pt - I have ordered labs and cxr for the [facility] lab and xray site  So ok to go there at her convenience for these to be done, thanks

## 2020-06-14 ENCOUNTER — Encounter: Payer: Self-pay | Admitting: Internal Medicine

## 2020-06-14 DIAGNOSIS — B349 Viral infection, unspecified: Secondary | ICD-10-CM | POA: Insufficient documentation

## 2020-06-14 NOTE — Assessment & Plan Note (Signed)
Lab Results  Component Value Date   HGBA1C 5.7 10/16/2017   Stable, pt to continue current medical treatment  - diet

## 2020-06-14 NOTE — Assessment & Plan Note (Signed)
Stable, cont current otc allegra prn

## 2020-06-14 NOTE — Assessment & Plan Note (Signed)
Etiology unclear, for repeat covid testing feb 3, inhaler prn, cough med prn, zofran prn, immodium prn, and note for work, and consider labs/cxr if repeat covid home testing is neg

## 2020-06-14 NOTE — Patient Instructions (Signed)
Please take all new medication as prescribed 

## 2020-06-15 ENCOUNTER — Ambulatory Visit (INDEPENDENT_AMBULATORY_CARE_PROVIDER_SITE_OTHER)
Admission: RE | Admit: 2020-06-15 | Discharge: 2020-06-15 | Disposition: A | Discharge status: Home / Self Care | Payer: 59 | Source: Ambulatory Visit | Attending: Internal Medicine | Admitting: Internal Medicine

## 2020-06-15 ENCOUNTER — Encounter: Payer: Self-pay | Admitting: Internal Medicine

## 2020-06-15 ENCOUNTER — Other Ambulatory Visit: Payer: Self-pay

## 2020-06-15 ENCOUNTER — Other Ambulatory Visit (INDEPENDENT_AMBULATORY_CARE_PROVIDER_SITE_OTHER): Payer: 59

## 2020-06-15 DIAGNOSIS — R059 Cough, unspecified: Secondary | ICD-10-CM

## 2020-06-15 DIAGNOSIS — R531 Weakness: Secondary | ICD-10-CM | POA: Diagnosis not present

## 2020-06-15 DIAGNOSIS — R0602 Shortness of breath: Secondary | ICD-10-CM | POA: Diagnosis not present

## 2020-06-15 LAB — CBC WITH DIFFERENTIAL/PLATELET
Basophils Absolute: 0 10*3/uL (ref 0.0–0.1)
Basophils Relative: 0.2 % (ref 0.0–3.0)
Eosinophils Absolute: 0.4 10*3/uL (ref 0.0–0.7)
Eosinophils Relative: 4.3 % (ref 0.0–5.0)
HCT: 40.6 % (ref 36.0–46.0)
Hemoglobin: 14.3 g/dL (ref 12.0–15.0)
Lymphocytes Relative: 19.5 % (ref 12.0–46.0)
Lymphs Abs: 1.8 10*3/uL (ref 0.7–4.0)
MCHC: 35.3 g/dL (ref 30.0–36.0)
MCV: 87.1 fl (ref 78.0–100.0)
Monocytes Absolute: 0.8 10*3/uL (ref 0.1–1.0)
Monocytes Relative: 8.3 % (ref 3.0–12.0)
Neutro Abs: 6.4 10*3/uL (ref 1.4–7.7)
Neutrophils Relative %: 67.7 % (ref 43.0–77.0)
Platelets: 308 10*3/uL (ref 150.0–400.0)
RBC: 4.66 Mil/uL (ref 3.87–5.11)
RDW: 12.9 % (ref 11.5–15.5)
WBC: 9.4 10*3/uL (ref 4.0–10.5)

## 2020-06-15 LAB — BASIC METABOLIC PANEL
BUN: 9 mg/dL (ref 6–23)
CO2: 30 mEq/L (ref 19–32)
Calcium: 9.6 mg/dL (ref 8.4–10.5)
Chloride: 100 mEq/L (ref 96–112)
Creatinine, Ser: 0.71 mg/dL (ref 0.40–1.20)
GFR: 100.02 mL/min (ref >60.00)
Glucose, Bld: 97 mg/dL (ref 70–99)
Potassium: 3.6 mEq/L (ref 3.5–5.1)
Sodium: 135 mEq/L (ref 135–145)

## 2020-06-15 LAB — HEPATIC FUNCTION PANEL
ALT: 39 U/L — ABNORMAL HIGH (ref 0–35)
AST: 34 U/L (ref 0–37)
Albumin: 4.3 g/dL (ref 3.5–5.2)
Alkaline Phosphatase: 56 U/L (ref 39–117)
Bilirubin, Direct: 0.2 mg/dL (ref 0.0–0.3)
Total Bilirubin: 1 mg/dL (ref 0.2–1.2)
Total Protein: 7.7 g/dL (ref 6.0–8.3)

## 2020-06-15 MED ORDER — BENZONATATE 100 MG PO CAPS: ORAL_CAPSULE | ORAL | 0 refills | Status: DC

## 2020-06-15 NOTE — Telephone Encounter (Signed)
shaniya -   To plesae create and attach letter for pt for dates off work she requested, thanks

## 2020-06-15 NOTE — Telephone Encounter (Signed)
Ok for work note to cover today

## 2020-06-16 ENCOUNTER — Encounter: Payer: Self-pay | Admitting: Internal Medicine

## 2020-06-19 ENCOUNTER — Other Ambulatory Visit: Payer: Self-pay | Admitting: Neurology

## 2020-06-29 NOTE — Telephone Encounter (Signed)
Pt has not returned call to schedule an appointment.

## 2020-07-06 ENCOUNTER — Telehealth: Payer: Self-pay | Admitting: Neurology

## 2020-07-06 ENCOUNTER — Other Ambulatory Visit: Payer: Self-pay

## 2020-07-06 ENCOUNTER — Other Ambulatory Visit: Payer: Self-pay | Admitting: Neurology

## 2020-07-06 MED ORDER — CYCLOBENZAPRINE HCL 5 MG PO TABS: 5.0000 mg | ORAL_TABLET | Freq: Three times a day (TID) | ORAL | 3 refills | Status: DC | PRN

## 2020-07-06 MED ORDER — GABAPENTIN 600 MG PO TABS: 600.0000 mg | ORAL_TABLET | Freq: Three times a day (TID) | ORAL | 3 refills | Status: DC

## 2020-07-06 NOTE — Telephone Encounter (Signed)
Refills sent to [facility]

## 2020-07-06 NOTE — Telephone Encounter (Signed)
Erroneous encounter

## 2020-07-06 NOTE — Telephone Encounter (Signed)
Patient has switched pharmacy to [facility]. Please send medication refills to this pharmacy. They are needing a new prescription for patient's gabapentin sent to them, they aren't able to get it from escript[facility], it's coming up blank.

## 2020-07-13 ENCOUNTER — Encounter: Payer: Self-pay | Admitting: Internal Medicine

## 2020-07-13 MED ORDER — BENZONATATE 100 MG PO CAPS: ORAL_CAPSULE | ORAL | 0 refills | Status: DC

## 2020-07-13 NOTE — Telephone Encounter (Signed)
Called pt, LVM to call office to schedule and appointment.

## 2020-07-13 NOTE — Telephone Encounter (Signed)
Please assist pt to make appt any provider

## 2020-07-20 ENCOUNTER — Other Ambulatory Visit: Payer: Self-pay | Admitting: Internal Medicine

## 2020-07-20 DIAGNOSIS — I10 Essential (primary) hypertension: Secondary | ICD-10-CM

## 2020-07-20 NOTE — Telephone Encounter (Signed)
Please refill as per office routine med refill policy (all routine meds refilled for 3 mo or monthly per pt preference up to one year from last visit, then month to month grace period for 3 mo, then further med refills will have to be denied)  

## 2020-07-21 ENCOUNTER — Other Ambulatory Visit: Payer: Self-pay | Admitting: Internal Medicine

## 2020-07-24 ENCOUNTER — Other Ambulatory Visit: Payer: Self-pay

## 2020-07-24 ENCOUNTER — Encounter: Payer: Self-pay | Admitting: Internal Medicine

## 2020-07-24 ENCOUNTER — Ambulatory Visit: Payer: 59 | Admitting: Internal Medicine

## 2020-07-24 VITALS — BP 132/76 | HR 78 | Temp 98.0°F | Ht 68.0 in | Wt 311.0 lb

## 2020-07-24 DIAGNOSIS — J4521 Mild intermittent asthma with (acute) exacerbation: Secondary | ICD-10-CM

## 2020-07-24 DIAGNOSIS — R229 Localized swelling, mass and lump, unspecified: Secondary | ICD-10-CM

## 2020-07-24 DIAGNOSIS — R059 Cough, unspecified: Secondary | ICD-10-CM

## 2020-07-24 DIAGNOSIS — R739 Hyperglycemia, unspecified: Secondary | ICD-10-CM | POA: Diagnosis not present

## 2020-07-24 MED ORDER — BENZONATATE 100 MG PO CAPS: ORAL_CAPSULE | ORAL | 2 refills | Status: DC

## 2020-07-24 MED ORDER — METHYLPREDNISOLONE ACETATE 80 MG/ML IJ SUSP
80.0000 mg | Freq: Once | INTRAMUSCULAR | Status: AC
  Administered 2020-07-24: 80 mg via INTRAMUSCULAR

## 2020-07-24 MED ORDER — PREDNISONE 10 MG PO TABS: ORAL_TABLET | ORAL | 0 refills | Status: DC

## 2020-07-24 MED ORDER — FLUTICASONE-SALMETEROL 250-50 MCG/DOSE IN AEPB: 1.0000 | INHALATION_SPRAY | Freq: Two times a day (BID) | RESPIRATORY_TRACT | 3 refills | Status: DC

## 2020-07-24 NOTE — Progress Notes (Signed)
Patient ID: [name], female   DOB: 10/12/1970, 50 y.o.   MRN: [id number]        Chief Complaint: cough and knots all over       HPI:  [name] is a 50 y.o. female here with c/o persistent cough that was well controlled with tessalon perle but ran out, here to hopefully get a refill.  Cough has been mild but chronic persistent nagging and scant productive, assoc with mild sob/doe.  Has been treated for asthma in the past, has albuterol hfa at home but not using lately.  At least since Jun 12 2020 has also had unusual tender knot to multiple areas, moderately tender constant but now 6 wks later are mostly smaller in size, less tender and has what appears to have darkened skin over several areas where the knots have pretty much gone. Locations have included left submandibular and medial arms much now less and improved size and tenderness, but still has several rather large (but less tender) knots to left and right mid forearms, right elbow extensor surfaace area, medial right wrist, right prox post index finger, left 5th finger and lateral left index finger as well.   Pt denies fever, unintentinoal wt loss, night sweats, loss of appetite, or other constitutional symptoms, but  Has been trying to lose wt purposefully.  Jun 30, 2020 cxr - nad.  Pt denies chest pain, orthopnea, PND, increased LE swelling, palpitations, dizziness or syncope.  Pt denies polydipsia, polyuria,.     Wt Readings from Last 3 Encounters:  07/24/20 (!) 311 lb (141.1 kg)  05/18/20 (!) 317 lb 9.6 oz (144.1 kg)  04/09/20 (!) 313 lb 12.8 oz (142.3 kg)   BP Readings from Last 3 Encounters:  07/24/20 132/76  05/18/20 136/84  04/09/20 126/83         Past Medical History:  Diagnosis Date  . Anxiety   . Elevated blood pressure, situational 05/05/2016  . Endometriosis   . Exercise-induced asthma 09/08/2015  . Ovarian cancer, bilateral 11/28/2014  . PONV (postoperative nausea and vomiting)   . Thyroid disease    Past  Surgical History:  Procedure Laterality Date  . ABDOMINAL HYSTERECTOMY  11/10/14   at [facility], Exp lap, supracervical hyst, BSO, infracolic omentectomy, lymphadenectomy, aortic lymph node sampling  . CHOLECYSTECTOMY N/A 07/24/2018   Procedure: LAPAROSCOPIC CHOLECYSTECTOMY WITH INTRAOPERATIVE CHOLANGIOGRAM;  Surgeon: Armandina Gemma, MD;  Location: [facility];  Service: General;  Laterality: N/A;  . ERCP N/A 07/26/2018   Procedure: ENDOSCOPIC RETROGRADE CHOLANGIOPANCREATOGRAPHY (ERCP);  Surgeon: Ladene Artist, MD;  Location: [facility];  Service: Endoscopy;  Laterality: N/A;  . IR GENERIC HISTORICAL  05/12/2016   IR REMOVAL TUN ACCESS W/ PORT W/O FL MOD SED 05/12/2016 [facility]  . REMOVAL OF STONES  07/26/2018   Procedure: REMOVAL OF STONES;  Surgeon: Ladene Artist, MD;  Location: [facility];  Service: Endoscopy;;  . Joan Mayans  07/26/2018   Procedure: SPHINCTEROTOMY;  Surgeon: Ladene Artist, MD;  Location: [facility];  Service: Endoscopy;;    reports that she has never smoked. She has never used smokeless tobacco. She reports previous alcohol use. She reports that she does not use drugs. family history includes Atrial fibrillation in her father; Dementia in her paternal grandfather and paternal grandmother; Diabetes in her maternal aunt and mother; Healthy in her brother; Heart attack in her maternal grandfather; Heart failure in her maternal grandmother; Hypertension in her mother; Stroke in her mother. Allergies  Allergen Reactions  . Moxifloxacin  Anaphylaxis    needs epinephrine shot  . Quinolones Anaphylaxis, Shortness Of Breath and Swelling  . Doxycycline Nausea And Vomiting  . Escitalopram Oxalate Other (See Comments)     fatigue  . Sulfonamide Derivatives Swelling and Rash    needs epinephrine shot--rash and lip swelling   Current Outpatient Medications on File Prior to Visit  Medication Sig Dispense Refill  . acetaminophen (TYLENOL) 650 MG CR tablet Take 1,300 mg by mouth  daily as needed for pain.    Marland Kitchen albuterol (VENTOLIN HFA) 108 (90 Base) MCG/ACT inhaler Inhale 2 puffs into the lungs every 6 (six) hours as needed for wheezing or shortness of breath. 8 g 0  . ALPRAZolam (XANAX) 1 MG tablet TAKE 1 TABLET (1 MG TOTAL) BY MOUTH 2 (TWO) TIMES DAILY AS NEEDED. FOR ANXIETY 60 tablet 2  . cyclobenzaprine (FLEXERIL) 5 MG tablet Take 1 tablet (5 mg total) by mouth every 8 (eight) hours as needed for muscle spasms. 30 tablet 3  . ELDERBERRY PO Take by mouth.    . estradiol (VIVELLE-DOT) 0.075 MG/24HR Place 1 patch onto the skin 2 (two) times a week. 24 patch 0  . fluticasone (FLONASE) 50 MCG/ACT nasal spray Place 2 sprays into both nostrils daily. In each nostril 16 g 1  . gabapentin (NEURONTIN) 600 MG tablet Take 1 tablet (600 mg total) by mouth 3 (three) times daily. 90 tablet 3  . hydrocortisone 2.5 % cream Apply topically 3 (three) times daily as needed. 28 g 0  . hydroxypropyl methylcellulose / hypromellose (ISOPTO TEARS / GONIOVISC) 2.5 % ophthalmic solution Place 1 drop into both eyes daily as needed for dry eyes.    Marland Kitchen ibuprofen (ADVIL) 200 MG tablet Take 400 mg by mouth every 6 (six) hours as needed.    Marland Kitchen levothyroxine (SYNTHROID) 75 MCG tablet TAKE 1 TABLET BY MOUTH EVERY DAY 30 tablet 0  . lisinopril (ZESTRIL) 5 MG tablet TAKE 1 TABLET BY MOUTH DAILY 90 tablet 3  . Melatonin 10 MG TABS Take 10 mg by mouth at bedtime.    . ondansetron (ZOFRAN) 4 MG tablet Take 1 tablet (4 mg total) by mouth every 8 (eight) hours as needed for nausea or vomiting. 20 tablet 0  . pantoprazole (PROTONIX) 40 MG tablet TAKE 1 TABLET BY MOUTH TWICE A DAY FOR 2 WEEKS, THEN TAKE 1 TABLET ONCE DAILY 30 tablet 5  . pyridOXINE (VITAMIN B-6) 50 MG tablet Take 50 mg by mouth daily.    . sertraline (ZOLOFT) 100 MG tablet TAKE 1 TABLET BY MOUTH EVERY DAY 90 tablet 3  . sodium chloride (OCEAN) 0.65 % SOLN nasal spray Place 1 spray into both nostrils daily as needed for congestion.    Marland Kitchen VALERIAN PO  Take 1 tablet by mouth daily.    Marland Kitchen isoniazid (NYDRAZID) 300 MG tablet Take by mouth daily. (Patient not taking: Reported on 07/24/2020)     No current facility-administered medications on file prior to visit.        ROS:  All others reviewed and negative.  Objective        PE:  BP 132/76   Pulse 78   Temp 98 F (36.7 C) (Oral)   Ht 5\' 8"  (1.727 m)   Wt (!) 311 lb (141.1 kg)   LMP 11/02/2014   SpO2 97%   BMI 47.29 kg/m                 Constitutional: Pt appears in NAD  HENT: Head: NCAT.                Right Ear: External ear normal.                 Left Ear: External ear normal.                Eyes: . Pupils are equal, round, and reactive to light. Conjunctivae and EOM are normal               Nose: without d/c or deformity               Neck: Neck supple. Gross normal ROM               Cardiovascular: Normal rate and regular rhythm.                 Pulmonary/Chest: Effort normal and breath sounds without rales or wheezing.                Abd:  Soft, NT, ND, + BS, no organomegaly               Neurological: Pt is alert. At baseline orientation, motor grossly intact               Skin: Skin is warm. No rashes, no other new lesions, LE edema - trace bilat               Psychiatric: Pt behavior is normal without agitation   Micro: none  Cardiac tracings I have personally interpreted today:  none  Pertinent Radiological findings (summarize): none   Lab Results  Component Value Date   WBC 9.4 06/15/2020   HGB 14.3 06/15/2020   HCT 40.6 06/15/2020   PLT 308.0 06/15/2020   GLUCOSE 97 06/15/2020   CHOL 171 10/16/2017   TRIG 97.0 10/16/2017   HDL 39.10 10/16/2017   LDLCALC 112 (H) 10/16/2017   ALT 39 (H) 06/15/2020   AST 34 06/15/2020   NA 135 06/15/2020   K 3.6 06/15/2020   CL 100 06/15/2020   CREATININE 0.71 06/15/2020   BUN 9 06/15/2020   CO2 30 06/15/2020   TSH 5.12 (H) 10/16/2017   INR 0.90 05/12/2016   HGBA1C 5.7 10/16/2017   Assessment/Plan:   [name] is a 50 y.o. White or Caucasian [1] female with  has a past medical history of Anxiety, Elevated blood pressure, situational (05/05/2016), Endometriosis, Exercise-induced asthma (09/08/2015), Ovarian cancer, bilateral (11/28/2014), PONV (postoperative nausea and vomiting), and Thyroid disease.  Cough ? 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  [name], MD 07/25/2020 3:57 PM [facility]

## 2020-07-24 NOTE — Patient Instructions (Signed)
You had the steroid shot today  Please take all new medication as prescribed  - the prednisone, and later advair if this helps  You will be contacted regarding the referral for: pulmonary for possible sarcoidosis  Please continue all other medications as before, and refills have been done if requested.  Please have the pharmacy call with any other refills you may need.  Please continue your efforts at being more active, low cholesterol diet, and weight control.  Please keep your appointments with your specialists as you may have planned

## 2020-07-25 ENCOUNTER — Encounter: Payer: Self-pay | Admitting: Internal Medicine

## 2020-07-25 NOTE — Assessment & Plan Note (Signed)
Lab Results  Component Value Date   HGBA1C 5.7 10/16/2017   Stable, pt to continue current medical treatment  - diet, wt control

## 2020-07-25 NOTE — Assessment & Plan Note (Signed)
Mild uncontrolled today, for depomedrol im 80, predpac asd, and restart advair asd

## 2020-07-25 NOTE — Assessment & Plan Note (Signed)
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

## 2020-07-25 NOTE — Assessment & Plan Note (Signed)
?   Cough variant asthma, recent cxr neg but conts symptomatic - for tessalon perle refill

## 2020-07-31 ENCOUNTER — Encounter: Payer: Self-pay | Admitting: Internal Medicine

## 2020-07-31 MED ORDER — PREDNISONE 10 MG PO TABS: ORAL_TABLET | ORAL | 0 refills | Status: DC

## 2020-08-06 DIAGNOSIS — K432 Incisional hernia without obstruction or gangrene: Secondary | ICD-10-CM | POA: Diagnosis not present

## 2020-08-12 ENCOUNTER — Other Ambulatory Visit (HOSPITAL_COMMUNITY): Payer: Self-pay

## 2020-08-12 ENCOUNTER — Telehealth: Payer: Self-pay | Admitting: *Deleted

## 2020-08-12 MED FILL — Gabapentin Tab 600 MG: ORAL | 30 days supply | Qty: 90 | Fill #0 | Status: AC

## 2020-08-12 NOTE — Telephone Encounter (Signed)
Attempted to reach the patient but had to leave a message. Left message for the patient to call the office back to schedule a follow up/lab appt

## 2020-08-12 NOTE — Telephone Encounter (Signed)
Patient called the office back and scheduled appts

## 2020-08-18 ENCOUNTER — Encounter: Payer: Self-pay | Admitting: Internal Medicine

## 2020-08-18 MED ORDER — TRAZODONE HCL 50 MG PO TABS: 25.0000 mg | ORAL_TABLET | Freq: Every evening | ORAL | 3 refills | Status: DC | PRN

## 2020-08-19 ENCOUNTER — Other Ambulatory Visit (HOSPITAL_COMMUNITY): Payer: Self-pay

## 2020-08-19 MED FILL — Cyclobenzaprine HCl Tab 5 MG: ORAL | 10 days supply | Qty: 30 | Fill #0 | Status: AC

## 2020-08-20 ENCOUNTER — Other Ambulatory Visit (HOSPITAL_COMMUNITY): Payer: Self-pay

## 2020-08-21 ENCOUNTER — Other Ambulatory Visit (HOSPITAL_COMMUNITY): Payer: Self-pay

## 2020-08-21 MED ORDER — FLUTICASONE-SALMETEROL 250-50 MCG/DOSE IN AEPB
1.0000 | INHALATION_SPRAY | Freq: Two times a day (BID) | RESPIRATORY_TRACT | 2 refills | Status: DC
  Filled 2020-08-21: qty 60, 30d supply, fill #0

## 2020-08-21 MED ORDER — ESTRADIOL 0.075 MG/24HR TD PTTW
MEDICATED_PATCH | TRANSDERMAL | 0 refills | Status: DC
  Filled 2020-08-21: qty 8, 28d supply, fill #0

## 2020-09-03 ENCOUNTER — Telehealth: Payer: Self-pay | Admitting: *Deleted

## 2020-09-03 NOTE — Telephone Encounter (Signed)
PT CANCELED PV AND COLON

## 2020-09-03 NOTE — Telephone Encounter (Signed)
Patient no showed PV today- Called patient- RANG ONCE TO VOICE MAIL  and left message to return call by 5 pm today- If no call by 5 pm, PV and procedure will be canceled - no call at 5 pm -  PV and Procedure both canceled- No Show letter mailed to patient

## 2020-09-04 ENCOUNTER — Encounter: Payer: Self-pay | Admitting: Gynecologic Oncology

## 2020-09-07 ENCOUNTER — Inpatient Hospital Stay: Payer: 59

## 2020-09-07 ENCOUNTER — Other Ambulatory Visit (HOSPITAL_COMMUNITY): Payer: Self-pay

## 2020-09-07 ENCOUNTER — Inpatient Hospital Stay: Payer: 59 | Attending: Gynecologic Oncology | Admitting: Gynecologic Oncology

## 2020-09-07 ENCOUNTER — Other Ambulatory Visit: Payer: Self-pay

## 2020-09-07 VITALS — BP 133/90 | HR 83 | Temp 97.6°F | Resp 16 | Ht 66.0 in | Wt 312.6 lb

## 2020-09-07 DIAGNOSIS — C563 Malignant neoplasm of bilateral ovaries: Secondary | ICD-10-CM

## 2020-09-07 DIAGNOSIS — Z8543 Personal history of malignant neoplasm of ovary: Secondary | ICD-10-CM | POA: Diagnosis not present

## 2020-09-07 DIAGNOSIS — Z9221 Personal history of antineoplastic chemotherapy: Secondary | ICD-10-CM | POA: Diagnosis not present

## 2020-09-07 DIAGNOSIS — Z79899 Other long term (current) drug therapy: Secondary | ICD-10-CM | POA: Diagnosis not present

## 2020-09-07 DIAGNOSIS — Z90710 Acquired absence of both cervix and uterus: Secondary | ICD-10-CM | POA: Insufficient documentation

## 2020-09-07 DIAGNOSIS — E8940 Asymptomatic postprocedural ovarian failure: Secondary | ICD-10-CM | POA: Diagnosis not present

## 2020-09-07 DIAGNOSIS — Z90722 Acquired absence of ovaries, bilateral: Secondary | ICD-10-CM | POA: Diagnosis not present

## 2020-09-07 LAB — BASIC METABOLIC PANEL
Anion gap: 10 (ref 5–15)
BUN: 14 mg/dL (ref 6–20)
CO2: 28 mmol/L (ref 22–32)
Calcium: 9.7 mg/dL (ref 8.9–10.3)
Chloride: 100 mmol/L (ref 98–111)
Creatinine, Ser: 0.77 mg/dL (ref 0.44–1.00)
GFR, Estimated: >60 mL/min (ref >60)
Glucose, Bld: 88 mg/dL (ref 70–99)
Potassium: 4 mmol/L (ref 3.5–5.1)
Sodium: 138 mmol/L (ref 135–145)

## 2020-09-07 MED ORDER — ESTRADIOL 0.1 MG/24HR TD PTTW
1.0000 | MEDICATED_PATCH | TRANSDERMAL | 12 refills | Status: DC
  Filled 2020-09-07: qty 8, 28d supply, fill #0
  Filled 2020-11-30: qty 8, 28d supply, fill #1
  Filled 2021-02-16: qty 8, 28d supply, fill #0
  Filled 2021-04-22: qty 8, 28d supply, fill #1
  Filled 2021-07-07: qty 8, 28d supply, fill #2
  Filled 2021-08-02: qty 8, 28d supply, fill #3

## 2020-09-07 NOTE — Progress Notes (Signed)
Follow Up Note: Gyn-Onc  [name] 50 y.o. female  CC:  Chief Complaint  Patient presents with  . Ovarian cancer, bilateral   Assessment/Plan:   [name] is a 50 y.o. 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 s/p adjuvant carboplatin/paclitaxel chemotherapy (completed November, 2016).  No evidence of recurrence on today's exam. Recommend CT abd/pelvis. If this is free of disease, recommend discharging from scheduled surveillance care as she will be past 5 years from last therapy with no measurable recurrence.   She desires an annual CA 125 assessment to document its normalcy.   HPI:  [name] is a 50 year old female woman who presented to the [facility] [place] [facility] department in July, 2016 with acute onset abdominal pain, dyspnea, and bilateral ovarian masses seen on CT. She was transferred to [facility] from [facility] for further evaluation and treatment. She reported a history of endometriosis and infertility but no other gynecologic history. Her last pap smear was multiple years prior to presentation and she denied a history of dysplasia along with abnormal uterine bleeding.  Preop Labs and Imaging:  CA 125: 554  CEA: 0.9  CA 19-9: 587  CT 11/09/14: IMPRESSION: 1. Large multiloculated partially cystic partially solid mass replacing the left ovary. The right ovary is also enlarged, heterogeneous, and abnormal in appearance with the inferior margin immediately abutting this mass lesion. Despite trace fat plane along the superior aspect of the right ovary and cystic/solid mass, the right ovary is markedly abnormal in appearance and certainly involved. These findings are concerning for a primary ovarian malignancy involving both ovaries. A pelvic ultrasound has been ordered the time of examination and may be useful in further evaluation of these pelvic masses. 2. The base of the appendix is slightly dilated and demonstrates hyperenhancement of the wall. Additionally, the  lateral aspect of the base the appendix immediately abuts the right side of the pelvic mass. These findings may reflect appendiceal involvement. 3. Minimal abutment of the sigmoid colon with relatively preserved fat plane throughout. No differential dilatation of the sigmoid colon or obvious tethering to suggest sigmoid involvement. However, no discrete fat plane is seen within a short segment as described above. 4. Trace ascites. No hydronephrosis or omental caking. 5. Minimal perihepatic free fluid as well less enlarged portacaval lymph node node. 6. Subtle posterior right hepatic dome hypodensity is too small to characterize. While this lesion does not have the typical appearance of a metastasis, this finding remains indeterminate on this suboptimally timed study. If clinically indicated, a MRI of the abdomen without and with contrast may be obtained for further evaluation.  On 11/10/14, she underwent an exploratory laparotomy and radical tumor debulking with lysis of adhesions for 90 minutes and bilateral ureterolysis for retroperitoneal fibrosis by Dr. Thurston Pounds at [facility].  Operative findings included:  1. Chocolate colored ascites immediately noted upon abdominal entry consistent with ruptured endometrioma  2. Bilateral ovarian masses with chocolate fluid, left side measuring ~12cm with ~8cm solid component, right side measuring ~6cm (frozen section of the left ovary showing invasive carcinoma concerning for clear cell)  3. 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 10 cm fibroid uterus with normal cervix to palpation  5. Normal abdominal survey with normal appearing omentum, normal appearing appendix retroperitoneal and running cephalad along the cecum, normal small and large bowel without evidence of cancer spread, smooth diaphragms bilaterally, smooth liver edge with hepatomegaly, normal stomach and  normal kidneys on  palpation  6. Sigmoid colon densely adherent to the uterus and posterior cervix precluding cervical excision  7. No pathologically enlarged lymph nodes but discolored shotty left obturator and aortic lymph nodes excised  8. R0 resection with no gross residual cancer at surgery close.  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 (please see synoptic report)  - Endometriosis  Left fallopian tube:  - Negative for involvement by clear cell carcinoma  B: Ovary and tube, right salpingo-oophorectomy  Right ovary:  - Ovarian clear cell carcinoma (please see synoptic report)  - Endometriosis  Right fallopian tube:  - Negative for involvement by clear cell carcinoma  - Endometriosis  C: Uterus, supracervical hysterectomy  - Negative for involvement by clear cell carcinoma  Additional findings:  Endometrium: - Weakly proliferative endometrium  - No hyperplasia or carcinoma identified  Myometrium:  - Extensive adenomyosis  D: Lymph node, aortic, regional resection  - One lymph node negative for metastatic carcinoma (0/1)  E: Lymph node, right pelvic, regional resection  - Four lymph nodes negative for metastatic carcinoma (0/4)  F: Lymph node, left pelvic, regional resection  - Two lymph nodes negative for metastatic carcinoma (0/2)  G: Omentum, omentectomy  - Negative for involvement by clear cell carcinoma  - Mature adipose tissue  H: Peritoneum, biopsy  - Negative for involvement by clear cell carcinoma  - Fibroadipose tissue  A: Pelvic washing  - No malignant cells identified (see comment)  B: Diaphragm, brushing/scraping  - No malignant cells identified  - Reactive mesothelial cells  Interval History:   She went on to receive 6 cycles of adjuvant carboplatin and paclitaxel chemotherapy completed in November, 2016.  07/23/19 CA 125 23.5.   CT chest was performed on July 19, 2019 to follow-up previous findings of a right middle lobe pulmonary nodule.  This was stable on repeat scans.  There was a 4 mm indeterminate left upper lobe pulmonary nodule which had not been included in the prior scan which would have been in abdomen pelvis CT.  No follow-up is needed the patient was low risk which she is considered as she is a non-smoker.  In March 2020 she underwent a laparoscopic cholecystectomy for choledocholithiasis.  This was complicated by postoperative common bile duct stone and pancreatitis.  Following that surgical procedure she had ongoing issues of epigastric discomfort and umbilical discomfort at the site of where the umbilical incision had been.  She had been followed up with a virtual visit with [facility] but no in person visit.  Review of Systems  Constitutional: Feels well.  No fever, chills, early satiety, unintentional weight loss or gain.  Appetite improving.  Cardiovascular: No chest pain, shortness of breath, or edema.  Pulmonary: No cough or wheeze.  Gastrointestinal: + abdominal discomfort Genitourinary: + dyspareunia Musculoskeletal: No myalgia or joint pain. Neurologic: No weakness, numbness, or change in gait.  Psychology: No depression, anxiety, or insomnia.  Current Meds:  Outpatient Encounter Medications as of 09/07/2020  Medication Sig  . acetaminophen (TYLENOL) 650 MG CR tablet Take 1,300 mg by mouth daily as needed for pain.  Marland Kitchen albuterol (VENTOLIN HFA) 108 (90 Base) MCG/ACT inhaler INHALE 2 PUFFS INTO THE LUNGS EVERY 6 (SIX) HOURS AS NEEDED FOR WHEEZING OR SHORTNESS OF BREATH.  Marland Kitchen ALPRAZolam (XANAX) 1 MG tablet TAKE 1 TABLET (1 MG TOTAL) BY MOUTH 2 (TWO) TIMES DAILY AS NEEDED.  FOR ANXIETY  . ELDERBERRY PO Take by mouth.  . Fluticasone-Salmeterol (ADVAIR DISKUS) 250-50 MCG/DOSE AEPB Inhale 1 puff into the lungs 2 (two) times daily.  Marland Kitchen gabapentin (NEURONTIN) 600 MG tablet TAKE 1 TABLET BY MOUTH THREE TIMES DAILY.  . hydroxypropyl  methylcellulose / hypromellose (ISOPTO TEARS / GONIOVISC) 2.5 % ophthalmic solution Place 1 drop into both eyes daily as needed for dry eyes.  Marland Kitchen ibuprofen (ADVIL) 200 MG tablet Take 400 mg by mouth every 6 (six) hours as needed.  Marland Kitchen levothyroxine (SYNTHROID) 75 MCG tablet TAKE 1 TABLET BY MOUTH EVERY DAY  . lisinopril (ZESTRIL) 5 MG tablet TAKE 1 TABLET BY MOUTH DAILY  . pyridOXINE (VITAMIN B-6) 50 MG tablet Take 50 mg by mouth daily.  . sertraline (ZOLOFT) 100 MG tablet TAKE 1 TABLET BY MOUTH EVERY DAY  . sodium chloride (OCEAN) 0.65 % SOLN nasal spray Place 1 spray into both nostrils daily as needed for congestion.  . traZODone (DESYREL) 50 MG tablet Take 0.5-1 tablets (25-50 mg total) by mouth at bedtime as needed for sleep.  . benzonatate (TESSALON PERLES) 100 MG capsule 1 - 2 tabs by mouth three times daily as needed for cough (Patient not taking: Reported on 09/04/2020)  . cyclobenzaprine (FLEXERIL) 5 MG tablet TAKE 1 TABLET BY MOUTH EVERY 8 HOURS AS NEEDED FOR MUSCLE SPASMS.  Marland Kitchen estradiol (VIVELLE-DOT) 0.075 MG/24HR Place 1 patch onto the skin 2 (two) times a week. (Patient not taking: Reported on 09/04/2020)  . fluticasone (FLONASE) 50 MCG/ACT nasal spray Place 2 sprays into both nostrils daily. In each nostril (Patient not taking: Reported on 09/04/2020)  . [DISCONTINUED] estradiol (VIVELLE-DOT) 0.075 MG/24HR Apply 1 patch onto the skin twice weekly  . [DISCONTINUED] Fluticasone-Salmeterol (ADVAIR) 250-50 MCG/DOSE AEPB Inhale 1 puff into the lungs 2 (two) times daily.  . [DISCONTINUED] HYDROcodone-homatropine (HYCODAN) 5-1.5 MG/5ML syrup TAKE 5 MLS BY MOUTH EVERY 6 (SIX) HOURS AS NEEDED FOR UP TO 10 DAYS FOR COUGH.  . [DISCONTINUED] hydrocortisone 2.5 % cream Apply topically 3 (three) times daily as needed.  . [DISCONTINUED] isoniazid (NYDRAZID) 300 MG tablet Take by mouth daily. (Patient not taking: Reported on 07/24/2020)  . [DISCONTINUED] Melatonin 10 MG TABS Take 10 mg by mouth at bedtime.   . [DISCONTINUED] ondansetron (ZOFRAN) 4 MG tablet TAKE 1 TABLET (4 MG TOTAL) BY MOUTH EVERY 8 (EIGHT) HOURS AS NEEDED FOR NAUSEA OR VOMITING.  . [DISCONTINUED] pantoprazole (PROTONIX) 40 MG tablet TAKE 1 TABLET BY MOUTH TWICE A DAY FOR 2 WEEKS, THEN TAKE 1 TABLET ONCE DAILY  . [DISCONTINUED] predniSONE (DELTASONE) 10 MG tablet 3 tabs by mouth per day for 3 days,2tabs per day for 3 days,1tab per day for 3 days  . [DISCONTINUED] VALERIAN PO Take 1 tablet by mouth daily.   No facility-administered encounter medications on file as of 09/07/2020.    Allergy:  Allergies  Allergen Reactions  . Moxifloxacin Anaphylaxis    needs epinephrine shot  . Quinolones Anaphylaxis, Shortness Of Breath and Swelling  . Doxycycline Nausea And Vomiting  . Escitalopram Oxalate Other (See Comments)     fatigue  . Sulfonamide Derivatives Swelling and Rash    needs epinephrine shot--rash and lip swelling    Social Hx:   Social History   Socioeconomic History  . Marital status: Divorced    Spouse name: Not on file  . Number of children: 0  . Years of education: Not on file  . Highest education level: Not on file  Occupational History  .  Not on file  Tobacco Use  . Smoking status: Never Smoker  . Smokeless tobacco: Never Used  Vaping Use  . Vaping Use: Never used  Substance and Sexual Activity  . Alcohol use: Not Currently    Alcohol/week: 0.0 standard drinks    Comment: Rarely  . Drug use: No  . Sexual activity: Not on file  Other Topics Concern  . Not on file  Social History Narrative   Lives with boyfriend in a one story home.  Has no children.     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     Education: college.   Right handed    Social Determinants of Health   Financial Resource Strain: Not on file  Food Insecurity: Not on file  Transportation Needs: Not on file  Physical Activity: Not on file  Stress: Not on file  Social Connections: Not on file  Intimate Partner Violence: Not on file    Past  Surgical Hx:  Past Surgical History:  Procedure Laterality Date  . ABDOMINAL HYSTERECTOMY  11/10/14   at [facility], Exp lap, supracervical hyst, BSO, infracolic omentectomy, lymphadenectomy, aortic lymph node sampling  . CHOLECYSTECTOMY N/A 07/24/2018   Procedure: LAPAROSCOPIC CHOLECYSTECTOMY WITH INTRAOPERATIVE CHOLANGIOGRAM;  Surgeon: Armandina Gemma, MD;  Location: [facility] ORS;  Service: General;  Laterality: N/A;  . ERCP N/A 07/26/2018   Procedure: ENDOSCOPIC RETROGRADE CHOLANGIOPANCREATOGRAPHY (ERCP);  Surgeon: Ladene Artist, MD;  Location: [facility];  Service: Endoscopy;  Laterality: N/A;  . IR GENERIC HISTORICAL  05/12/2016   IR REMOVAL TUN ACCESS W/ PORT W/O FL MOD SED 05/12/2016 [facility]-INTER[facility]  . REMOVAL OF STONES  07/26/2018   Procedure: REMOVAL OF STONES;  Surgeon: Ladene Artist, MD;  Location: [facility];  Service: Endoscopy;;  . Joan Mayans  07/26/2018   Procedure: SPHINCTEROTOMY;  Surgeon: Ladene Artist, MD;  Location: [facility];  Service: Endoscopy;;    Past Medical Hx:  Past Medical History:  Diagnosis Date  . Anxiety   . Elevated blood pressure, situational 05/05/2016  . Endometriosis   . Exercise-induced asthma 09/08/2015  . Ovarian cancer, bilateral 11/28/2014  . PONV (postoperative nausea and vomiting)   . Thyroid disease   . Umbilical hernia     Family Hx:  Family History  Problem Relation Age of Onset  . Hypertension Mother   . Diabetes Mother   . Stroke Mother        Deceased  . Atrial fibrillation Father        Living  . Diabetes Maternal Aunt   . Heart failure Maternal Grandmother   . Heart attack Maternal Grandfather   . Dementia Paternal Grandmother   . Dementia Paternal Grandfather   . Healthy Brother     Vitals:  Last menstrual period 11/02/2014.  Physical Exam:  General: Well developed, well nourished female in no acute distress. Alert and oriented x 3.  Cardiovascular: Regular rate and rhythm. S1 and S2 normal.  Lungs: Clear to auscultation  bilaterally. No wheezes/crackles/rhonchi noted.  Skin: No rashes or lesions present. Back: No CVA tenderness.  Abdomen: Abdomen soft, non-tender and morbidly obese. Active bowel sounds in all quadrants. No masses Genitourinary: bimanual exam reveals a cervix with no pelvic masses. Cervix normal. Extremities: Edema noted bilaterally, slightly more in the left lower extremity but non-pitting.  No erythema or tenderness noted with the LLE.  Negative Homans sign with the LLE.  No bilateral cyanosis or clubbing.    Thereasa Solo, MD 09/07/2020, 2:18 PM

## 2020-09-07 NOTE — Patient Instructions (Signed)
Dr Denman George is ordering a CT scan to document that there is no evidence of cancer.  If this is negative for signs of recurrence, she recommends that you suspend the scheduled surveillance visits as you have graduated from the 5-year surveillance period.  However it is reasonable to follow-up annually with Dr. Serita Grit office for a Ca1 25 assessment for peace of mind.  It is important to note that the Ca1 25 can be associated with false positive results, meaning that mild elevations in Ca1 25 may not indicate cancer.

## 2020-09-08 ENCOUNTER — Telehealth: Payer: Self-pay

## 2020-09-08 LAB — CA 125: Cancer Antigen (CA) 125: 27.5 U/mL (ref 0.0–38.1)

## 2020-09-08 NOTE — Telephone Encounter (Signed)
LM stating that her CA-125 was good and WNL at 27.5. If she has any questions or concerns she can call back to the office at [phone].

## 2020-09-09 ENCOUNTER — Ambulatory Visit: Payer: 59 | Admitting: Family Medicine

## 2020-09-09 ENCOUNTER — Ambulatory Visit: Payer: Self-pay

## 2020-09-09 ENCOUNTER — Other Ambulatory Visit: Payer: Self-pay | Admitting: Internal Medicine

## 2020-09-09 ENCOUNTER — Ambulatory Visit (INDEPENDENT_AMBULATORY_CARE_PROVIDER_SITE_OTHER): Payer: 59

## 2020-09-09 ENCOUNTER — Other Ambulatory Visit: Payer: Self-pay

## 2020-09-09 VITALS — BP 122/82 | HR 98 | Ht 66.0 in | Wt 311.6 lb

## 2020-09-09 DIAGNOSIS — M25561 Pain in right knee: Secondary | ICD-10-CM

## 2020-09-09 DIAGNOSIS — G8929 Other chronic pain: Secondary | ICD-10-CM

## 2020-09-09 DIAGNOSIS — Z6841 Body Mass Index (BMI) 40.0 and over, adult: Secondary | ICD-10-CM | POA: Diagnosis not present

## 2020-09-09 NOTE — Progress Notes (Signed)
I, Peterson Lombard, LAT, ATC acting as a scribe for Lynne Leader, MD.  Subjective:    I'm seeing this patient as a consultation for: Dr. [name]. Note will be routed back to referring provider/PCP.  CC: Right knee pain  HPI: Pt is a 50 y/o female c/o R knee pain ongoing since approx Nov. Pt has a PMHx of R meniscal tear and surgery 8-10 years ago. Pt has been trying to get active, did a 5K on 4/30, walked 2 miles this morning. Pt reports that knee will get very stiff and painful when not moving. Pt locates pain to the posterior aspect of R knee. 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  R knee swelling: yes- anterior medial aspect R knee mechanical symptoms: yes Aggravates: knee flexion Treatments tried: gabapentin, IBU  Pt also has R-sided low back pain ongoing for several years and worsening w/ activity. Pt has a hx of neuropathy. Pt reports radiating pain into R groin and thigh.  Past medical history, Surgical history, Family history, Social history, Allergies, and medications have been entered into the medical record, reviewed.   Review of Systems: No new headache, visual changes, nausea, vomiting, diarrhea, constipation, dizziness, abdominal pain, skin rash, fevers, chills, night sweats, weight loss, swollen lymph nodes, body aches, joint swelling, muscle aches, chest pain, shortness of breath, mood changes, visual or auditory hallucinations.   Objective:    Vitals:   09/09/20 1504  BP: 122/82  Pulse: 98  SpO2: 98%   General: Well Developed, well nourished, and in no acute distress.  Neuro/Psych: Alert and oriented x3, extra-ocular muscles intact, able to move all 4 extremities, sensation grossly intact. Skin: Warm and dry, no rashes noted.  Respiratory: Not using accessory muscles, speaking in full sentences, trachea midline.  Cardiovascular: Pulses palpable, no extremity edema. Abdomen: Does not appear distended. MSK: Right knee obese otherwise normal. Normal  motion. Tender palpation medial joint line. Stable ligamentous exam. Intact strength. Negative McMurray's test.  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 Await formal radiology review  Diagnostic Limited MSK Ultrasound of: Right knee Quad tendon intact normal. Patellar tendon normal. Medial and lateral joint line narrowed and degenerative appearing Posterior knee no Baker's cyst. Impression: DJD   Impression and Recommendations:    Assessment and Plan: 50 y.o. female with right knee pain worsening after increased activity level.  Patient has degenerative changes on x-ray and ultrasound.  She may have a degenerative meniscus injury as well.   Discussed treatment option.. Patient was reluctant to consider steroid injections.  We will authorize hyaluronic acid injection series.  Additionally work on Forensic scientist with physical therapy.  Also discussed weight loss.  BMI currently 50 and ultimately patient will likely need knee replacement BMI needs to be under 40.  Recommend [place] healthy weight and wellness.  She is thinking about it for now.  Recheck in about 1 month.  PDMP not reviewed this encounter. Orders Placed This Encounter  Procedures  . [place] LIMITED JOINT SPACE STRUCTURES LOW RIGHT(NO LINKED CHARGES)    Standing Status:   Future    Number of Occurrences:   1    Standing Expiration Date:   03/12/2021    Order Specific Question:   Reason for Exam (SYMPTOM  OR DIAGNOSIS REQUIRED)    Answer:   chronic right knee pain    Order Specific Question:   Preferred imaging location?  Answer:   [facility]  . DG Knee AP/LAT W/[place] Right    Standing Status:   Future    Number of Occurrences:   1    Standing Expiration Date:   09/09/2021    Order Specific Question:   Reason for Exam (SYMPTOM  OR DIAGNOSIS REQUIRED)     Answer:   eval knee pain    Order Specific Question:   Is patient pregnant?    Answer:   No    Order Specific Question:   Preferred imaging location?    Answer:   [facility]  . Ambulatory referral to Physical Therapy    Referral Priority:   Routine    Referral Type:   Physical Medicine    Referral Reason:   Specialty Services Required    Requested Specialty:   Physical Therapy   No orders of the defined types were placed in this encounter.   Discussed warning signs or symptoms. Please see discharge instructions. Patient expresses understanding.   The above documentation has been reviewed and is accurate and complete Lynne Leader, M.D.

## 2020-09-09 NOTE — Patient Instructions (Signed)
Thank you for coming in today.  Please get an Xray today before you leave  We will get the gel shots authorized.   I've referred you to Physical Therapy.  Let us know if you don't hear from them in one week.  Recheck in 1 month.   Please use Voltaren gel (Generic Diclofenac Gel) up to 4x daily for pain as needed.  This is available over-the-counter as both the name brand Voltaren gel and the generic diclofenac gel.

## 2020-09-10 ENCOUNTER — Encounter: Payer: Self-pay | Admitting: Pulmonary Disease

## 2020-09-10 ENCOUNTER — Ambulatory Visit: Payer: 59 | Admitting: Pulmonary Disease

## 2020-09-10 VITALS — BP 136/90 | HR 98 | Temp 98.0°F | Ht 67.0 in | Wt 311.2 lb

## 2020-09-10 DIAGNOSIS — R059 Cough, unspecified: Secondary | ICD-10-CM

## 2020-09-10 DIAGNOSIS — R0600 Dyspnea, unspecified: Secondary | ICD-10-CM

## 2020-09-10 DIAGNOSIS — R0609 Other forms of dyspnea: Secondary | ICD-10-CM

## 2020-09-10 DIAGNOSIS — Z9989 Dependence on other enabling machines and devices: Secondary | ICD-10-CM

## 2020-09-10 DIAGNOSIS — G4733 Obstructive sleep apnea (adult) (pediatric): Secondary | ICD-10-CM | POA: Diagnosis not present

## 2020-09-10 NOTE — Progress Notes (Signed)
Subjective:    Patient ID: [name], female    DOB: 1970/10/30, 50 y.o.   MRN: [id number]  HPI  50 year old never smoker, [employer] employee in [employer] department presents for evaluation of cough and shortness of breath since January 2022.  PMH -ovarian cancer 2016 status postchemotherapy , residual neuropathy OSA, diagnosed in 2017, maintained on CPAP Hypertension on lisinopril  Her symptoms started in January 2022 with cough, congestion and shortness of breath.  COVID testing was negative.  She also had tender nodules, up in the subcutaneous tissue of her forearms , fingers and her legs and submandibular lymphadenopathy these have almost gone away now.  PCP felt that symptoms may be related to sarcoidosis, she received a course of prednisone and Depo-Medrol 80 mg IM shot 07/2020, Tessalon Perles for cough Chest x-ray 06/15/2020 independently reviewed, does not show infiltrates  shortness of breath has persisted, especially with exercise and being outside.  Advair inhaler has helped somewhat.  She does admit to seasonal allergies to pollen.  She was able to do a 5K over the weekend and does walk about 2 miles per day She also reports a tickle in her throat and a dry cough. She previously has a history of exercise-induced asthma  Significant tests/ events reviewed HST  09/2015 AHI= 21  CT chest with con 07/2019 >> stable 4 mm right middle lobe nodule compared to 07/2018 CT abdomen, 4 mm left upper lobe nodule  Past Medical History:  Diagnosis Date  . Anxiety   . Elevated blood pressure, situational 05/05/2016  . Endometriosis   . Exercise-induced asthma 09/08/2015  . Ovarian cancer, bilateral 11/28/2014  . PONV (postoperative nausea and vomiting)   . Thyroid disease   . Umbilical hernia     Past Surgical History:  Procedure Laterality Date  . ABDOMINAL HYSTERECTOMY  11/10/14   at [facility], Exp lap, supracervical hyst, BSO, infracolic omentectomy, lymphadenectomy, aortic lymph  node sampling  . CHOLECYSTECTOMY N/A 07/24/2018   Procedure: LAPAROSCOPIC CHOLECYSTECTOMY WITH INTRAOPERATIVE CHOLANGIOGRAM;  Surgeon: Armandina Gemma, MD;  Location: [facility];  Service: General;  Laterality: N/A;  . ERCP N/A 07/26/2018   Procedure: ENDOSCOPIC RETROGRADE CHOLANGIOPANCREATOGRAPHY (ERCP);  Surgeon: Ladene Artist, MD;  Location: [facility];  Service: Endoscopy;  Laterality: N/A;  . IR GENERIC HISTORICAL  05/12/2016   IR REMOVAL TUN ACCESS W/ PORT W/O FL MOD SED 05/12/2016 [facility]  . REMOVAL OF STONES  07/26/2018   Procedure: REMOVAL OF STONES;  Surgeon: Ladene Artist, MD;  Location: [facility];  Service: Endoscopy;;  . Joan Mayans  07/26/2018   Procedure: SPHINCTEROTOMY;  Surgeon: Ladene Artist, MD;  Location: [facility];  Service: Endoscopy;;    Allergies  Allergen Reactions  . Moxifloxacin Anaphylaxis    needs epinephrine shot  . Quinolones Anaphylaxis, Shortness Of Breath and Swelling  . Doxycycline Nausea And Vomiting  . Escitalopram Oxalate Other (See Comments)     fatigue  . Sulfonamide Derivatives Swelling and Rash    needs epinephrine shot--rash and lip swelling    Social History   Socioeconomic History  . Marital status: Divorced    Spouse name: Not on file  . Number of children: 0  . Years of education: Not on file  . Highest education level: Not on file  Occupational History  . Not on file  Tobacco Use  . Smoking status: Never Smoker  . Smokeless tobacco: Never Used  Vaping Use  . Vaping Use: Never used  Substance and Sexual Activity  .  Alcohol use: Not Currently    Alcohol/week: 0.0 standard drinks    Comment: Rarely  . Drug use: No  . Sexual activity: Not on file  Other Topics Concern  . Not on file  Social History Narrative   Lives with boyfriend in a one story home.  Has no children.     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     Education: college.   Right handed    Social Determinants of Health   Financial Resource Strain: Not on file   Food Insecurity: Not on file  Transportation Needs: Not on file  Physical Activity: Not on file  Stress: Not on file  Social Connections: Not on file  Intimate Partner Violence: Not on file    Family History  Problem Relation Age of Onset  . Hypertension Mother   . Diabetes Mother   . Stroke Mother        Deceased  . Atrial fibrillation Father        Living  . Diabetes Maternal Aunt   . Heart failure Maternal Grandmother   . Heart attack Maternal Grandfather   . Dementia Paternal Grandmother   . Dementia Paternal Grandfather   . Healthy Brother      Review of Systems Nonproductive cough Acid heartburn indigestion Earache Anxiety Joint stiffness  Constitutional: negative for anorexia, fevers and sweats  Eyes: negative for irritation, redness and visual disturbance  Ears, nose, mouth, throat, and face: negative for  epistaxis, nasal congestion and sore throat  Respiratory: negative for sputum and wheezing  Cardiovascular: negative for chest pain, lower extremity edema, orthopnea, palpitations and syncope  Gastrointestinal: negative for abdominal pain, constipation, diarrhea, melena, nausea and vomiting  Genitourinary:negative for dysuria, frequency and hematuria  Hematologic/lymphatic: negative for bleeding, easy bruising and lymphadenopathy  Musculoskeletal:negative for arthralgias, muscle weakness  Neurological: negative for coordination problems, gait problems, headaches and weakness  Endocrine: negative for diabetic symptoms including polydipsia, polyuria and weight loss     Objective:   Physical Exam  Gen. Pleasant, obese, in no distress, normal affect ENT - no pallor,icterus, no post nasal drip, class 2-3 airway Neck: No JVD, no thyromegaly, no carotid bruits Lungs: no use of accessory muscles, no dullness to percussion, decreased without rales or rhonchi  Cardiovascular: Rhythm regular, heart sounds  normal, no murmurs or gallops, no peripheral  edema Abdomen: soft and non-tender, no hepatosplenomegaly, BS normal. Musculoskeletal: No deformities, no cyanosis or clubbing Neuro:  alert, non focal, no tremors       Assessment & Plan:

## 2020-09-10 NOTE — Patient Instructions (Signed)
Cough may be related to reactive airways. STO P lisinopril -check your blood pressure twice a week if greater than 160/90, may need to substitute with another medication.  Blood work today -check ANA and ACE levels.  CT chest with IV contrast on Monday to follow-up on nodule-we will try to arrange this with your CT scan as already arranged  CPAP supplies prescription will be sent to DME.  Meanwhile, stay on Advair 1 puff twice daily, rinse mouth after use. Okay to use albuterol 2 puffs every 6 hours as needed, especially prior to exercise

## 2020-09-12 NOTE — Assessment & Plan Note (Signed)
Cough may be related to reactive airways/cough variant asthma STO P lisinopril -check your blood pressure twice a week if greater than 160/90, may need to substitute with another medication.  Due to the appearance of subcutaneous nodules, question of sarcoidosis has been raised, chest x-ray was negative.  We will further investigate with Blood work today -check ANA and ACE levels.  CT chest with IV contrast on Monday to follow-up on nodule-we will try to arrange this with your CT scan as already arranged

## 2020-09-12 NOTE — Assessment & Plan Note (Signed)
PFTs will be arranged  Meanwhile, stay on Advair 1 puff twice daily, rinse mouth after use. Okay to use albuterol 2 puffs every 6 hours as needed, especially prior to exercise

## 2020-09-12 NOTE — Assessment & Plan Note (Signed)
CPAP supplies prescription will be sent to DME.  Weight loss encouraged, compliance with goal of at least 4-6 hrs every night is the expectation. Advised against medications with sedative side effects Cautioned against driving when sleepy - understanding that sleepiness will vary on a day to day basis

## 2020-09-14 ENCOUNTER — Telehealth: Payer: Self-pay | Admitting: Pulmonary Disease

## 2020-09-14 ENCOUNTER — Ambulatory Visit (HOSPITAL_COMMUNITY)
Admission: RE | Admit: 2020-09-14 | Discharge: 2020-09-14 | Disposition: A | Discharge status: Home / Self Care | Payer: 59 | Source: Ambulatory Visit | Attending: Gynecologic Oncology | Admitting: Gynecologic Oncology

## 2020-09-14 ENCOUNTER — Other Ambulatory Visit: Payer: Self-pay

## 2020-09-14 DIAGNOSIS — K439 Ventral hernia without obstruction or gangrene: Secondary | ICD-10-CM | POA: Diagnosis not present

## 2020-09-14 DIAGNOSIS — C563 Malignant neoplasm of bilateral ovaries: Secondary | ICD-10-CM | POA: Diagnosis not present

## 2020-09-14 DIAGNOSIS — Z8543 Personal history of malignant neoplasm of ovary: Secondary | ICD-10-CM | POA: Diagnosis not present

## 2020-09-14 DIAGNOSIS — K432 Incisional hernia without obstruction or gangrene: Secondary | ICD-10-CM | POA: Diagnosis not present

## 2020-09-14 DIAGNOSIS — R059 Cough, unspecified: Secondary | ICD-10-CM | POA: Insufficient documentation

## 2020-09-14 DIAGNOSIS — R918 Other nonspecific abnormal finding of lung field: Secondary | ICD-10-CM | POA: Diagnosis not present

## 2020-09-14 DIAGNOSIS — I700 Atherosclerosis of aorta: Secondary | ICD-10-CM | POA: Diagnosis not present

## 2020-09-14 DIAGNOSIS — N200 Calculus of kidney: Secondary | ICD-10-CM | POA: Diagnosis not present

## 2020-09-14 LAB — ANA: Anti Nuclear Antibody (ANA): POSITIVE — AB

## 2020-09-14 LAB — ANTI-NUCLEAR AB-TITER (ANA TITER): ANA Titer 1: 1:1280 {titer} — ABNORMAL HIGH

## 2020-09-14 LAB — ANGIOTENSIN CONVERTING ENZYME: Angiotensin-Converting Enzyme: 16 U/L (ref 9–67)

## 2020-09-14 MED ORDER — SODIUM CHLORIDE (PF) 0.9 % IJ SOLN
INTRAMUSCULAR | Status: AC
  Filled 2020-09-14: qty 50

## 2020-09-14 MED ORDER — IOHEXOL 300 MG/ML  SOLN
100.0000 mL | Freq: Once | INTRAMUSCULAR | Status: AC | PRN
  Administered 2020-09-14: 100 mL via INTRAVENOUS

## 2020-09-14 NOTE — Progress Notes (Signed)
Right knee x-ray shows medium arthritis

## 2020-09-14 NOTE — Telephone Encounter (Signed)
Called and spoke with pt letting her know the info stated by Dr. Elsworth Soho and she verbalized understanding.  Pt wanted to know if we were going to go ahead and place the referral for rheumatology or if we were going to wait until the results of the CT were back.  Pt is scheduled to have the CT chest today.

## 2020-09-14 NOTE — Telephone Encounter (Signed)
ANA is high .  This is a generic test for inflammation/auto-antibodies She will need more specific testing and referral to rheumatologist. Meanwhile await CT scan

## 2020-09-14 NOTE — Telephone Encounter (Signed)
Patient requested lab results from 09/10/20.  Patient reviewed results on my chart and is concerned with elevated ANA.  Message routed to Dr. Elsworth Soho to advise on labs

## 2020-09-15 ENCOUNTER — Other Ambulatory Visit: Payer: Self-pay | Admitting: Gynecologic Oncology

## 2020-09-15 NOTE — Telephone Encounter (Signed)
Tried to reach her twice today but voicemail. Nodules are slightly increased in size & new ones compared to 2021, LNs are slightly enlarged  None of these are large enough to biopsy. Certainly could be sarcoidosis. Since the skin nodules are gone down, best approach may be to obtain rheum consult now for high ANA & repeat CT w con in 3 months. I can call to discuss again tmoro if she can give a time

## 2020-09-15 NOTE — Telephone Encounter (Signed)
I have called and spoke with patient regarding results and Dr. Constance Goltz. Patient is requesting a call from Dr. Elsworth Soho at 1pm to discuss in further. Will route Dr. Elsworth Soho  Dr. Elsworth Soho, patient is requesting a call from you around 1pm. She will be on lunch then. Thanks!

## 2020-09-16 ENCOUNTER — Telehealth: Payer: Self-pay | Admitting: Pulmonary Disease

## 2020-09-16 NOTE — Telephone Encounter (Signed)
Discussed CT results in detail with pt Refer to Rheum - Dr Amil Amen or deveshwar ok CT chest w Con in 3 months to FU on multiple lung nodules

## 2020-09-16 NOTE — Telephone Encounter (Signed)
Called and patient stated that she had called earlier and left a message because she was waiting to get results from Dr Elsworth Soho. Patient currently states that she spoke with Dr Elsworth Soho over the phone (Dr Elsworth Soho called her/see telephone note from today 09/16/20) and no longer needs anything currently. Advised patient to please feel free to call us if she needs anything. Nothing further needed at this time.

## 2020-09-16 NOTE — Telephone Encounter (Signed)
Called and went over Dr Bari Mantis recommendations/orders to be placed. Patient confirmed she spoke with Dr Elsworth Soho and ok with orders being placed. All Orders placed per Dr Elsworth Soho who stated patient needs the CT with contrast. Lab order placed and patient expressed full understanding to get lab drawn prior to CT being done. Patient stated she will come to [place] office to have it drawn when the time comes.  Nothing further needed at this time.

## 2020-09-17 ENCOUNTER — Encounter: Payer: Self-pay | Admitting: Gynecologic Oncology

## 2020-09-17 ENCOUNTER — Encounter: Payer: No Typology Code available for payment source | Admitting: Gastroenterology

## 2020-09-17 NOTE — Telephone Encounter (Signed)
LM for Ms Kris Mouton that Dr. Denman George and Joylene John, NP saw Dr. Bari Mantis recommendations.  No interventions needed from a gyn oncology stand point. Dr. Denman George does not feel the the lung nodules are metastatic diease. She can call the office or send a follow up MyChart message if she has any other questions or concerns.

## 2020-09-28 DIAGNOSIS — H52223 Regular astigmatism, bilateral: Secondary | ICD-10-CM | POA: Diagnosis not present

## 2020-09-28 DIAGNOSIS — H5213 Myopia, bilateral: Secondary | ICD-10-CM | POA: Diagnosis not present

## 2020-09-28 DIAGNOSIS — H524 Presbyopia: Secondary | ICD-10-CM | POA: Diagnosis not present

## 2020-10-02 ENCOUNTER — Other Ambulatory Visit: Payer: Self-pay | Admitting: Internal Medicine

## 2020-10-08 MED FILL — Cyclobenzaprine HCl Tab 5 MG: ORAL | 10 days supply | Qty: 30 | Fill #1 | Status: CN

## 2020-10-08 MED FILL — Gabapentin Tab 600 MG: ORAL | 30 days supply | Qty: 90 | Fill #1 | Status: CN

## 2020-10-09 ENCOUNTER — Other Ambulatory Visit (HOSPITAL_COMMUNITY): Payer: Self-pay

## 2020-10-12 ENCOUNTER — Other Ambulatory Visit: Payer: Self-pay

## 2020-10-12 ENCOUNTER — Other Ambulatory Visit (HOSPITAL_COMMUNITY): Payer: Self-pay

## 2020-10-12 ENCOUNTER — Other Ambulatory Visit: Payer: Self-pay | Admitting: Internal Medicine

## 2020-10-12 ENCOUNTER — Ambulatory Visit (INDEPENDENT_AMBULATORY_CARE_PROVIDER_SITE_OTHER): Payer: 59 | Admitting: Neurology

## 2020-10-12 ENCOUNTER — Encounter: Payer: Self-pay | Admitting: Neurology

## 2020-10-12 VITALS — BP 157/92 | HR 92 | Ht 67.0 in | Wt 313.0 lb

## 2020-10-12 DIAGNOSIS — T451X5A Adverse effect of antineoplastic and immunosuppressive drugs, initial encounter: Secondary | ICD-10-CM | POA: Diagnosis not present

## 2020-10-12 DIAGNOSIS — G620 Drug-induced polyneuropathy: Secondary | ICD-10-CM | POA: Diagnosis not present

## 2020-10-12 DIAGNOSIS — G5603 Carpal tunnel syndrome, bilateral upper limbs: Secondary | ICD-10-CM | POA: Diagnosis not present

## 2020-10-12 DIAGNOSIS — M9903 Segmental and somatic dysfunction of lumbar region: Secondary | ICD-10-CM | POA: Diagnosis not present

## 2020-10-12 DIAGNOSIS — M9902 Segmental and somatic dysfunction of thoracic region: Secondary | ICD-10-CM | POA: Diagnosis not present

## 2020-10-12 DIAGNOSIS — M5414 Radiculopathy, thoracic region: Secondary | ICD-10-CM | POA: Diagnosis not present

## 2020-10-12 DIAGNOSIS — S83411A Sprain of medial collateral ligament of right knee, initial encounter: Secondary | ICD-10-CM | POA: Diagnosis not present

## 2020-10-12 DIAGNOSIS — M9906 Segmental and somatic dysfunction of lower extremity: Secondary | ICD-10-CM | POA: Diagnosis not present

## 2020-10-12 DIAGNOSIS — M5441 Lumbago with sciatica, right side: Secondary | ICD-10-CM | POA: Diagnosis not present

## 2020-10-12 DIAGNOSIS — S39012A Strain of muscle, fascia and tendon of lower back, initial encounter: Secondary | ICD-10-CM

## 2020-10-12 DIAGNOSIS — M9901 Segmental and somatic dysfunction of cervical region: Secondary | ICD-10-CM | POA: Diagnosis not present

## 2020-10-12 DIAGNOSIS — M530 Cervicocranial syndrome: Secondary | ICD-10-CM | POA: Diagnosis not present

## 2020-10-12 MED ORDER — CYCLOBENZAPRINE HCL 5 MG PO TABS
5.0000 mg | ORAL_TABLET | Freq: Every evening | ORAL | 3 refills | Status: DC | PRN
  Filled 2020-10-12 – 2021-02-03 (×4): qty 90, 90d supply, fill #0
  Filled 2021-04-22: qty 90, 90d supply, fill #1
  Filled 2021-07-07: qty 90, 90d supply, fill #2

## 2020-10-12 MED ORDER — TRAZODONE HCL 50 MG PO TABS
25.0000 mg | ORAL_TABLET | Freq: Every evening | ORAL | 1 refills | Status: DC | PRN
  Filled 2020-10-12 – 2021-02-23 (×2): qty 90, 90d supply, fill #0

## 2020-10-12 MED ORDER — GABAPENTIN 600 MG PO TABS
600.0000 mg | ORAL_TABLET | Freq: Three times a day (TID) | ORAL | 3 refills | Status: DC
  Filled 2020-10-12 – 2021-02-03 (×3): qty 270, 90d supply, fill #0
  Filled 2021-08-10: qty 270, 90d supply, fill #1
  Filled 2021-09-27 (×2): qty 270, 90d supply, fill #2

## 2020-10-12 MED FILL — Alprazolam Tab 1 MG: ORAL | 30 days supply | Qty: 60 | Fill #0 | Status: AC

## 2020-10-12 MED FILL — Sertraline HCl Tab 100 MG: ORAL | 90 days supply | Qty: 180 | Fill #0 | Status: AC

## 2020-10-12 NOTE — Patient Instructions (Signed)
Return to clinic in 1 year.

## 2020-10-12 NOTE — Progress Notes (Signed)
Follow-up Visit   Date: 10/12/20    [name] MRN: [id number] DOB: 04-28-1971   Interim History: [name] is a 50 y.o. right-handed Caucasian female with ovarian cancer s/p bilateral SPO and hysterectomy and chemotherapy (11/2014), hypothyroidism, endometriosis, migraines, and anxiety returning to the clinic for follow-up of chemotherapy-induced neuropathy, lumbar strain, and carpal tunnel syndrome.   She is trying to stay more active and regularly engages in walking 5Ks.  Her neuropathy is stable and well-controlled on gabapentin 600mg  three times daily.  Her carpal tunnel syndrome is stable.   Today, she complains of right knee and right hip/groin pain which is usually triggered about 3 miles into her walks.  Pain started in the knee and radiates up her leg into the groin. Prolonged sitting or standing aggravates her knee pain and low back pain. She continues to have low back pain. No numbness or tingling in this area.   Medications:  Current Outpatient Medications on File Prior to Visit  Medication Sig Dispense Refill  . acetaminophen (TYLENOL) 650 MG CR tablet Take 1,300 mg by mouth daily as needed for pain.    Marland Kitchen albuterol (VENTOLIN HFA) 108 (90 Base) MCG/ACT inhaler INHALE 2 PUFFS INTO THE LUNGS EVERY 6 (SIX) HOURS AS NEEDED FOR WHEEZING OR SHORTNESS OF BREATH. 18 g 0  . ALPRAZolam (XANAX) 1 MG tablet TAKE 1 TABLET (1 MG TOTAL) BY MOUTH 2 (TWO) TIMES DAILY AS NEEDED. FOR ANXIETY 60 tablet 2  . benzonatate (TESSALON PERLES) 100 MG capsule 1 - 2 tabs by mouth three times daily as needed for cough 60 capsule 2  . cyclobenzaprine (FLEXERIL) 5 MG tablet TAKE 1 TABLET BY MOUTH EVERY 8 HOURS AS NEEDED FOR MUSCLE SPASMS. 30 tablet 3  . estradiol (VIVELLE-DOT) 0.1 MG/24HR patch Place 1 patch (0.1 mg total) onto the skin 2 (two) times a week. 8 patch 12  . fluticasone (FLONASE) 50 MCG/ACT nasal spray Place 2 sprays into both nostrils daily. In each nostril 16 g 1  .  Fluticasone-Salmeterol (ADVAIR DISKUS) 250-50 MCG/DOSE AEPB Inhale 1 puff into the lungs 2 (two) times daily. 1 each 3  . gabapentin (NEURONTIN) 600 MG tablet TAKE 1 TABLET BY MOUTH THREE TIMES DAILY. 90 tablet 3  . hydroxypropyl methylcellulose / hypromellose (ISOPTO TEARS / GONIOVISC) 2.5 % ophthalmic solution Place 1 drop into both eyes daily as needed for dry eyes.    Marland Kitchen ibuprofen (ADVIL) 200 MG tablet Take 400 mg by mouth every 6 (six) hours as needed.    Marland Kitchen levothyroxine (SYNTHROID) 75 MCG tablet TAKE 1 TABLET BY MOUTH EVERY DAY 30 tablet 0  . lisinopril (ZESTRIL) 5 MG tablet TAKE 1 TABLET BY MOUTH DAILY 90 tablet 3  . sertraline (ZOLOFT) 100 MG tablet TAKE 1 TABLET BY MOUTH EVERY DAY 90 tablet 3  . sodium chloride (OCEAN) 0.65 % SOLN nasal spray Place 1 spray into both nostrils daily as needed for congestion.    . traZODone (DESYREL) 50 MG tablet TAKE 1/2 TO 1 TABLET BY MOUTH AT BEDTIME AS NEEDED FOR SLEEP **NEEDS TO USE CONE** 90 tablet 1   No current facility-administered medications on file prior to visit.    Allergies:  Allergies  Allergen Reactions  . Moxifloxacin Anaphylaxis    needs epinephrine shot  . Quinolones Anaphylaxis, Shortness Of Breath and Swelling  . Doxycycline Nausea And Vomiting  . Escitalopram Oxalate Other (See Comments)     fatigue  . Sulfonamide Derivatives Swelling and Rash    needs  epinephrine shot--rash and lip swelling    Vital Signs:  BP (!) 157/92   Pulse 92   Ht 5\' 7"  (1.702 m)   Wt (!) 313 lb (142 kg)   LMP 11/02/2014   SpO2 98%   BMI 49.02 kg/m   Neurological Exam: MENTAL STATUS including orientation to time, place, person, recent and remote memory, attention span and concentration, language, and fund of knowledge is normal.  Speech is not dysarthric.  CRANIAL NERVES:   Extraocular muscular muscles intact.  No ptosis.    MOTOR:  Motor strength shows 5/5 motor strength throughout.   MSRs:  Right                                                                  Left brachioradialis 2+  brachioradialis 2+  biceps 2+  biceps 2+  triceps 1+  triceps 1+  Patellar 1+  Patellar 1+  ankle jerk 0  ankle jerk 0  plantar response down  plantar response down   SENSORY:  Absent temperature and vibration in the feet bilaterally, reduced in the lower legs.   COORDINATION/GAIT:   Gait wide-based, stable. Tandem and stressed gait intact.   Data: Lab Results  Component Value Date   TSH 5.12 (H) 10/16/2017   NCS/EMG of the upper extremities 11/24/2015:  Bilateral median neuropathy at or distal to the wrist, consistent with clinical diagnosis of carpal tunnel syndrome. Overall, these findings are moderate in degree electrically   IMPRESSION/PLAN: 1.  Bilateral carpal tunnel syndrome, moderate. Stable.  She is not interested in surgery at this time  - Continue to wear wrist splints  2.  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  Right knee/thigh pain seems more suggestive of tendonitis.  She is followed by Sports Medicine  4.  Lumbar pain  - Continue flexeril 5mg  at bedtime  5. Bilateral ovarian cancer s/p SPO and hysterectomy (11/2014) and chemotherapy  Return to clinic in 6 months  Thank you for allowing me to participate in patient's care.  If I can answer any additional questions, I would be pleased to do so.    Sincerely,    Lodema Parma K. Posey Pronto, DO

## 2020-10-13 ENCOUNTER — Encounter: Payer: Self-pay | Admitting: Adult Health

## 2020-10-13 ENCOUNTER — Ambulatory Visit (INDEPENDENT_AMBULATORY_CARE_PROVIDER_SITE_OTHER): Payer: 59 | Admitting: Adult Health

## 2020-10-13 ENCOUNTER — Other Ambulatory Visit (HOSPITAL_COMMUNITY): Payer: Self-pay

## 2020-10-13 VITALS — BP 154/88 | HR 71 | Ht 67.0 in | Wt 311.0 lb

## 2020-10-13 DIAGNOSIS — R229 Localized swelling, mass and lump, unspecified: Secondary | ICD-10-CM | POA: Diagnosis not present

## 2020-10-13 DIAGNOSIS — R0609 Other forms of dyspnea: Secondary | ICD-10-CM

## 2020-10-13 DIAGNOSIS — Z9989 Dependence on other enabling machines and devices: Secondary | ICD-10-CM | POA: Diagnosis not present

## 2020-10-13 DIAGNOSIS — R918 Other nonspecific abnormal finding of lung field: Secondary | ICD-10-CM

## 2020-10-13 DIAGNOSIS — R0600 Dyspnea, unspecified: Secondary | ICD-10-CM | POA: Diagnosis not present

## 2020-10-13 DIAGNOSIS — R059 Cough, unspecified: Secondary | ICD-10-CM | POA: Diagnosis not present

## 2020-10-13 DIAGNOSIS — I10 Essential (primary) hypertension: Secondary | ICD-10-CM

## 2020-10-13 DIAGNOSIS — G4733 Obstructive sleep apnea (adult) (pediatric): Secondary | ICD-10-CM | POA: Diagnosis not present

## 2020-10-13 MED ORDER — LOSARTAN POTASSIUM 25 MG PO TABS
25.0000 mg | ORAL_TABLET | Freq: Every day | ORAL | 0 refills | Status: DC
  Filled 2020-10-13: qty 30, 30d supply, fill #0

## 2020-10-13 NOTE — Progress Notes (Signed)
_0  ID: [name], female    DOB: 1971-01-16, 50 y.o.   MRN: [id number]  Chief Complaint  Patient presents with   Follow-up    Referring provider: Biagio Borg, MD  HPI: 50 year old female never smoker followed for obstructive sleep apnea.  Reestablish Sep 10, 2020 for sleep apnea and consult for cough Works for [employer] in [place] history significant for ovarian cancer 2016 status post chemotherapy (residual neuropathy)  TEST/EVENTS :  HST  09/2015 AHI= 21   CT chest with con 07/2019 >> stable 4 mm right middle lobe nodule compared to 07/2018 CT abdomen, 4 mm left upper lobe nodule  Sep 14, 2020 CT chest increasing mediastinal and hilar lymph nodes, scattered pulmonary nodules   10/13/2020 Follow up : Cough and Lung nodules  Patient returns for a 1 month follow-up.  Patient was seen last visit to reestablish for sleep apnea.  She was recommended to restart CPAP.  Unfortunately she has not wearing her CPAP. Needs new supplies . We discussed compliance . Has daytime sleepiness.   Patient complained that in January 2022 got acute illness with cough, congestion and associated tender/painful  nodules along her forearms fingers and legs.  There was concern she may have possible underlying sarcoidosis.  Her primary care provider gave her prednisone and Tessalon. Nodules improved after steroids but still has some residual hyperpigmentation .  Patient was set up for a CT chest completed on Sep 14, 2020 that showed enlarged  mediastinal and hilar lymph nodes.  Along with multiple scattered pulmonary nodules.  Last visit patient was recommended to stop her lisinopril.  PFT is pending.  ACE level was normal.  Her ANA was positive, ANA titer 1: 1280 She was referred to rheumatology.  And recommended to have a follow-up CT chest in 3 months Cough is only slightly better continues to have a dry cough .  ACE inhibitor was stopped last ov , blood pressure has been elevated.  No headache or visual changes.    Allergies  Allergen Reactions   Moxifloxacin Anaphylaxis    needs epinephrine shot   Quinolones Anaphylaxis, Shortness Of Breath and Swelling   Doxycycline Nausea And Vomiting   Escitalopram Oxalate Other (See Comments)     fatigue   Sulfonamide Derivatives Swelling and Rash    needs epinephrine shot--rash and lip swelling    Immunization History  Administered Date(s) Administered   Influenza,inj,Quad PF,6+ Mos 01/02/2014, 03/26/2015, 12/25/2017, 01/21/2020   Influenza-Unspecified 02/23/2016, 02/09/2017, 02/12/2020   MMR 02/09/2017, 07/03/2017   PFIZER(Purple Top)SARS-COV-2 Vaccination 08/01/2019, 08/22/2019, 02/22/2020   Tdap 11/07/2017   Tetanus 12/21/2015    Past Medical History:  Diagnosis Date   Anxiety    Elevated blood pressure, situational 05/05/2016   Endometriosis    Exercise-induced asthma 09/08/2015   Ovarian cancer, bilateral 11/28/2014   PONV (postoperative nausea and vomiting)    Thyroid disease    Umbilical hernia     Tobacco History: Social History   Tobacco Use  Smoking Status Never Smoker  Smokeless Tobacco Never Used   Counseling given: Not Answered   Outpatient Medications Prior to Visit  Medication Sig Dispense Refill   acetaminophen (TYLENOL) 650 MG CR tablet Take 1,300 mg by mouth daily as needed for pain.     albuterol (VENTOLIN HFA) 108 (90 Base) MCG/ACT inhaler INHALE 2 PUFFS INTO THE LUNGS EVERY 6 (SIX) HOURS AS NEEDED FOR WHEEZING OR SHORTNESS OF BREATH. 18 g 0   ALPRAZolam (XANAX) 1 MG tablet  Take 1 tablet (1 mg total) by mouth 2 (two) times daily as needed for anxiety. 60 tablet 2   benzonatate (TESSALON PERLES) 100 MG capsule 1 - 2 tabs by mouth three times daily as needed for cough 60 capsule 2   cyclobenzaprine (FLEXERIL) 5 MG tablet Take 1 tablet (5 mg total) by mouth at bedtime as needed for muscle spasms. 90 tablet 3   estradiol (VIVELLE-DOT) 0.1 MG/24HR patch Place 1 patch (0.1 mg total) onto  the skin 2 (two) times a week. 8 patch 12   fluticasone (FLONASE) 50 MCG/ACT nasal spray Place 2 sprays into both nostrils daily. In each nostril 16 g 1   Fluticasone-Salmeterol (ADVAIR DISKUS) 250-50 MCG/DOSE AEPB Inhale 1 puff into the lungs 2 (two) times daily. 1 each 3   gabapentin (NEURONTIN) 600 MG tablet Take 1 tablet (600 mg total) by mouth 3 (three) times daily. 270 tablet 3   hydroxypropyl methylcellulose / hypromellose (ISOPTO TEARS / GONIOVISC) 2.5 % ophthalmic solution Place 1 drop into both eyes daily as needed for dry eyes.     ibuprofen (ADVIL) 200 MG tablet Take 400 mg by mouth every 6 (six) hours as needed.     levothyroxine (SYNTHROID) 75 MCG tablet TAKE 1 TABLET BY MOUTH EVERY DAY 30 tablet 0   lisinopril (ZESTRIL) 5 MG tablet TAKE 1 TABLET BY MOUTH DAILY 90 tablet 3   sertraline (ZOLOFT) 100 MG tablet TAKE 1 TABLET BY MOUTH EVERY DAY 90 tablet 3   sertraline (ZOLOFT) 100 MG tablet TAKE 2 TABLETS (200 MG TOTAL) BY MOUTH DAILY. 180 tablet 3   sodium chloride (OCEAN) 0.65 % SOLN nasal spray Place 1 spray into both nostrils daily as needed for congestion.     traZODone (DESYREL) 50 MG tablet TAKE 1/2 TO 1 TABLET BY MOUTH AT BEDTIME AS NEEDED FOR SLEEP **NEEDS TO USE CONE** 90 tablet 1   traZODone (DESYREL) 50 MG tablet Take 0.5-1 tablets (25-50 mg total) by mouth at bedtime as needed for sleep. 90 tablet 1   No facility-administered medications prior to visit.     Review of Systems:   Constitutional:   No  weight loss, night sweats,  Fevers, chills, fatigue, or  lassitude.  HEENT:   No headaches,  Difficulty swallowing,  Tooth/dental problems, or  Sore throat,                No sneezing, itching, ear ache, nasal congestion, post nasal drip,   CV:  No chest pain,  Orthopnea, PND, swelling in lower extremities, anasarca, dizziness, palpitations, syncope.   GI  No heartburn, indigestion, abdominal pain, nausea, vomiting, diarrhea, change in bowel habits, loss of appetite,  bloody stools.   Resp: No shortness of breath with exertion or at rest.  No excess mucus, no productive cough,  No non-productive cough,  No coughing up of blood.  No change in color of mucus.  No wheezing.  No chest wall deformity  Skin: no rash or lesions.  GU: no dysuria, change in color of urine, no urgency or frequency.  No flank pain, no hematuria   MS:  No joint pain or swelling.  No decreased range of motion.  No back pain.    Physical Exam  Ht _0  (1.702 m)   Wt (!) 311 lb (141.1 kg)   LMP 11/02/2014   BMI 48.71 kg/m   GEN: A/Ox3; pleasant , NAD, well nourished    HEENT:  [place]/AT,  EACs-clear, TMs-wnl, NOSE-clear, THROAT-clear, no lesions,  no postnasal drip or exudate noted.   NECK:  Supple w/ fair ROM; no JVD; normal carotid impulses w/o bruits; no thyromegaly or nodules palpated; no lymphadenopathy.    RESP  Clear  P & A; w/o, wheezes/ rales/ or rhonchi. no accessory muscle use, no dullness to percussion  CARD:  RRR, no m/r/g, no peripheral edema, pulses intact, no cyanosis or clubbing.  GI:   Soft & nt; nml bowel sounds; no organomegaly or masses detected.   Musco: Warm bil, no deformities or joint swelling noted.   Neuro: alert, no focal deficits noted.    Skin: Warm, no lesions or rashes    Lab Results:  CBC    Component Value Date/Time   WBC 9.4 06/15/2020 1215   RBC 4.66 06/15/2020 1215   HGB 14.3 06/15/2020 1215   HGB 13.7 04/21/2016 1439   HCT 40.6 06/15/2020 1215   HCT 40.1 04/21/2016 1439   PLT 308.0 06/15/2020 1215   PLT 277 04/21/2016 1439   MCV 87.1 06/15/2020 1215   MCV 87.7 04/21/2016 1439   MCH 29.7 07/29/2018 0628   MCHC 35.3 06/15/2020 1215   RDW 12.9 06/15/2020 1215   RDW 13.1 04/21/2016 1439   LYMPHSABS 1.8 06/15/2020 1215   LYMPHSABS 2.8 04/21/2016 1439   MONOABS 0.8 06/15/2020 1215   MONOABS 0.6 04/21/2016 1439   EOSABS 0.4 06/15/2020 1215   EOSABS 0.2 04/21/2016 1439   BASOSABS 0.0 06/15/2020 1215   BASOSABS 0.0  04/21/2016 1439    BMET    Component Value Date/Time   NA 138 09/07/2020 1403   NA 139 04/21/2016 1439   K 4.0 09/07/2020 1403   K 3.4 (L) 04/21/2016 1439   CL 100 09/07/2020 1403   CO2 28 09/07/2020 1403   CO2 25 04/21/2016 1439   GLUCOSE 88 09/07/2020 1403   GLUCOSE 115 04/21/2016 1439   BUN 14 09/07/2020 1403   BUN 10.6 04/21/2016 1439   CREATININE 0.77 09/07/2020 1403   CREATININE 0.81 07/19/2019 0730   CREATININE 0.8 04/21/2016 1439   CALCIUM 9.7 09/07/2020 1403   CALCIUM 9.6 04/21/2016 1439   GFRNONAA >60 09/07/2020 1403   GFRNONAA >60 07/19/2019 0730   GFRAA >60 07/19/2019 0730    BNP    Component Value Date/Time   BNP 21.2 09/04/2015 1224    ProBNP No results found for: PROBNP  Imaging: CT Chest W Contrast  Result Date: 09/15/2020 CLINICAL DATA:  Follow-up pulmonary nodule in a 50 year old female with history of ovarian cancer post hysterectomy. EXAM: CT CHEST, ABDOMEN, AND PELVIS WITH CONTRAST TECHNIQUE: Multidetector CT imaging of the chest, abdomen and pelvis was performed following the standard protocol during bolus administration of intravenous contrast. CONTRAST:  161m OMNIPAQUE IOHEXOL 300 MG/ML  SOLN COMPARISON:  July 19, 2019. FINDINGS: CT CHEST FINDINGS Cardiovascular: Normal heart size. No pericardial effusion. Normal caliber of the thoracic aorta. Central pulmonary vasculature is normal caliber. Mediastinum/Nodes: No thoracic inlet lymphadenopathy. No axillary lymphadenopathy. Increasing prominence of mediastinal lymph nodes since the previous exam. The RIGHT paratracheal lymph node (image 16/2) 10 mm previously 8 mm. Subcarinal nodal tissue 10 mm (image 27/2) previously less than a cm. LEFT paratracheal nodal tissue (image 18/2) 10 mm previously less than a cm. Bi hilar nodal prominence increasing since the previous study (image 28/2) 10 mm LEFT infrahilar lymph node previously 4 mm. Similar mild nodal enlargement of the RIGHT hilar nodes. Small lymph  nodes tracking throughout the mediastinum elsewhere increasing in conspicuity since previous imaging. No internal  mammary adenopathy. Lungs/Pleura: 5 mm RIGHT middle lobe pulmonary nodule (image 80/4) previously approximately 3 mm. New RIGHT middle lobe pulmonary nodule (image 76/4 3 mm. Airways are patent. LEFT upper lobe nodule (image 86/4) previously 3-4 mm now approximately 4-5 mm. Subtle fissural nodularity bilaterally. LEFT lower lobe nodule (image 78/4) 8 x 5 mm.  Previously 6 x 3 mm. New LEFT upper lobe pulmonary nodule (image 70/4) 6 mm. New LEFT lower lobe pulmonary nodule (image 94/4) 7 x 6 mm. No effusion. No consolidation. Musculoskeletal: See below for full musculoskeletal details. CT ABDOMEN PELVIS FINDINGS Hepatobiliary: No focal, suspicious hepatic lesion. Post cholecystectomy. Portal vein is patent. Liver span enlarged with lobular hepatic contours. Liver measuring 21 cm greatest craniocaudal dimension. Pancreas: Normal, without mass, inflammation or ductal dilatation. Spleen: Splenomegaly approximately 13 cm greatest craniocaudal dimension. Adrenals/Urinary Tract: Adrenal glands are normal. Symmetric renal enhancement. No hydronephrosis. No perinephric stranding. Urinary bladder with smooth contours, limited assessment due to under distension. Nephrolithiasis in the lower pole the LEFT kidney approximately 3 mm calculus Stomach/Bowel: No acute gastrointestinal process. The appendix is normal. Ventral hernia containing portion of the transverse colon is unchanged with respect to with of the opening but now contains colon with further herniation of fat as well, 6 cm diastatic rectus hernia near the umbilicus. Vascular/Lymphatic: Scattered atheromatous plaque of the abdominal aorta. No aneurysmal dilation. There is no gastrohepatic or hepatoduodenal ligament lymphadenopathy. No retroperitoneal or mesenteric lymphadenopathy. No pelvic sidewall lymphadenopathy. Reproductive: Post hysterectomy.  No  adnexal mass. Other: Ventral hernia as described.  No ascites. Musculoskeletal: No acute musculoskeletal process. Spinal degenerative changes, mild. No destructive bone finding. IMPRESSION: 1. Increasing prominence of mediastinal and hilar lymph nodes since the previous exam. Findings could be seen in the setting of sarcoidosis, lymphoproliferative disorder or metastatic disease. 2. New pulmonary nodules concerning for metastatic disease, could also be seen in the setting of sarcoidosis or metastatic disease. Pulmonary consultation may be warranted. 3. Ventral hernia with rectus diastasis similar to prior imaging but now containing colon and fat with increasing protrusion of intra-abdominal contents into the abdominal wall. 4. Lobular hepatic contours raising the question of liver disease, associated with mild splenomegaly. 5. Nonobstructing LEFT nephrolithiasis. 6. Aortic atherosclerosis. Aortic Atherosclerosis (ICD10-I70.0). Electronically Signed   By: Zetta Bills M.D.   On: 09/15/2020 10:59   CT Abdomen Pelvis W Contrast  Result Date: 09/15/2020 CLINICAL DATA:  Follow-up pulmonary nodule in a 50 year old female with history of ovarian cancer post hysterectomy. EXAM: CT CHEST, ABDOMEN, AND PELVIS WITH CONTRAST TECHNIQUE: Multidetector CT imaging of the chest, abdomen and pelvis was performed following the standard protocol during bolus administration of intravenous contrast. CONTRAST:  163m OMNIPAQUE IOHEXOL 300 MG/ML  SOLN COMPARISON:  July 19, 2019. FINDINGS: CT CHEST FINDINGS Cardiovascular: Normal heart size. No pericardial effusion. Normal caliber of the thoracic aorta. Central pulmonary vasculature is normal caliber. Mediastinum/Nodes: No thoracic inlet lymphadenopathy. No axillary lymphadenopathy. Increasing prominence of mediastinal lymph nodes since the previous exam. The RIGHT paratracheal lymph node (image 16/2) 10 mm previously 8 mm. Subcarinal nodal tissue 10 mm (image 27/2) previously less  than a cm. LEFT paratracheal nodal tissue (image 18/2) 10 mm previously less than a cm. Bi hilar nodal prominence increasing since the previous study (image 28/2) 10 mm LEFT infrahilar lymph node previously 4 mm. Similar mild nodal enlargement of the RIGHT hilar nodes. Small lymph nodes tracking throughout the mediastinum elsewhere increasing in conspicuity since previous imaging. No internal mammary adenopathy. Lungs/Pleura: 5 mm RIGHT middle  lobe pulmonary nodule (image 80/4) previously approximately 3 mm. New RIGHT middle lobe pulmonary nodule (image 76/4 3 mm. Airways are patent. LEFT upper lobe nodule (image 86/4) previously 3-4 mm now approximately 4-5 mm. Subtle fissural nodularity bilaterally. LEFT lower lobe nodule (image 78/4) 8 x 5 mm.  Previously 6 x 3 mm. New LEFT upper lobe pulmonary nodule (image 70/4) 6 mm. New LEFT lower lobe pulmonary nodule (image 94/4) 7 x 6 mm. No effusion. No consolidation. Musculoskeletal: See below for full musculoskeletal details. CT ABDOMEN PELVIS FINDINGS Hepatobiliary: No focal, suspicious hepatic lesion. Post cholecystectomy. Portal vein is patent. Liver span enlarged with lobular hepatic contours. Liver measuring 21 cm greatest craniocaudal dimension. Pancreas: Normal, without mass, inflammation or ductal dilatation. Spleen: Splenomegaly approximately 13 cm greatest craniocaudal dimension. Adrenals/Urinary Tract: Adrenal glands are normal. Symmetric renal enhancement. No hydronephrosis. No perinephric stranding. Urinary bladder with smooth contours, limited assessment due to under distension. Nephrolithiasis in the lower pole the LEFT kidney approximately 3 mm calculus Stomach/Bowel: No acute gastrointestinal process. The appendix is normal. Ventral hernia containing portion of the transverse colon is unchanged with respect to with of the opening but now contains colon with further herniation of fat as well, 6 cm diastatic rectus hernia near the umbilicus.  Vascular/Lymphatic: Scattered atheromatous plaque of the abdominal aorta. No aneurysmal dilation. There is no gastrohepatic or hepatoduodenal ligament lymphadenopathy. No retroperitoneal or mesenteric lymphadenopathy. No pelvic sidewall lymphadenopathy. Reproductive: Post hysterectomy.  No adnexal mass. Other: Ventral hernia as described.  No ascites. Musculoskeletal: No acute musculoskeletal process. Spinal degenerative changes, mild. No destructive bone finding. IMPRESSION: 1. Increasing prominence of mediastinal and hilar lymph nodes since the previous exam. Findings could be seen in the setting of sarcoidosis, lymphoproliferative disorder or metastatic disease. 2. New pulmonary nodules concerning for metastatic disease, could also be seen in the setting of sarcoidosis or metastatic disease. Pulmonary consultation may be warranted. 3. Ventral hernia with rectus diastasis similar to prior imaging but now containing colon and fat with increasing protrusion of intra-abdominal contents into the abdominal wall. 4. Lobular hepatic contours raising the question of liver disease, associated with mild splenomegaly. 5. Nonobstructing LEFT nephrolithiasis. 6. Aortic atherosclerosis. Aortic Atherosclerosis (ICD10-I70.0). Electronically Signed   By: Zetta Bills M.D.   On: 09/15/2020 10:59      No flowsheet data found.  No results found for: NITRICOXIDE      Assessment & Plan:   No problem-specific Assessment & Plan notes found for this encounter.     Rexene Edison, NP 10/13/2020

## 2020-10-13 NOTE — Patient Instructions (Addendum)
Set up PFT.  CT chest in 2 months  Continue on Advair 1 puff Twice daily  , rinse after use.  Begin Delsym 2 tsp Twice daily  For cough as needed  Use Tessalon Three times a day  For cough As needed   Add Pepcid 20mg  At bedtime  For 2 weeks .  Follow up with Rheumatology next month as planned  Follow up with Dr. Elsworth Soho  2 months to review CT .  Please contact office for sooner follow up if symptoms do not improve or worsen or seek emergency care   Begin Losartan 25mg  daily , follow up with Dr. [name] for blood pressure management .

## 2020-10-14 ENCOUNTER — Other Ambulatory Visit (HOSPITAL_COMMUNITY): Payer: Self-pay

## 2020-10-14 DIAGNOSIS — S83411A Sprain of medial collateral ligament of right knee, initial encounter: Secondary | ICD-10-CM | POA: Diagnosis not present

## 2020-10-14 DIAGNOSIS — M9903 Segmental and somatic dysfunction of lumbar region: Secondary | ICD-10-CM | POA: Diagnosis not present

## 2020-10-14 DIAGNOSIS — M5414 Radiculopathy, thoracic region: Secondary | ICD-10-CM | POA: Diagnosis not present

## 2020-10-14 DIAGNOSIS — M9902 Segmental and somatic dysfunction of thoracic region: Secondary | ICD-10-CM | POA: Diagnosis not present

## 2020-10-14 DIAGNOSIS — M9906 Segmental and somatic dysfunction of lower extremity: Secondary | ICD-10-CM | POA: Diagnosis not present

## 2020-10-14 DIAGNOSIS — M9901 Segmental and somatic dysfunction of cervical region: Secondary | ICD-10-CM | POA: Diagnosis not present

## 2020-10-14 DIAGNOSIS — M5441 Lumbago with sciatica, right side: Secondary | ICD-10-CM | POA: Diagnosis not present

## 2020-10-14 DIAGNOSIS — M530 Cervicocranial syndrome: Secondary | ICD-10-CM | POA: Diagnosis not present

## 2020-10-15 ENCOUNTER — Ambulatory Visit: Payer: Self-pay

## 2020-10-15 ENCOUNTER — Ambulatory Visit (INDEPENDENT_AMBULATORY_CARE_PROVIDER_SITE_OTHER): Payer: 59 | Admitting: Family Medicine

## 2020-10-15 ENCOUNTER — Other Ambulatory Visit: Payer: Self-pay

## 2020-10-15 ENCOUNTER — Encounter: Payer: Self-pay | Admitting: Family Medicine

## 2020-10-15 DIAGNOSIS — M9901 Segmental and somatic dysfunction of cervical region: Secondary | ICD-10-CM | POA: Diagnosis not present

## 2020-10-15 DIAGNOSIS — M1711 Unilateral primary osteoarthritis, right knee: Secondary | ICD-10-CM | POA: Diagnosis not present

## 2020-10-15 DIAGNOSIS — M530 Cervicocranial syndrome: Secondary | ICD-10-CM | POA: Diagnosis not present

## 2020-10-15 DIAGNOSIS — M9906 Segmental and somatic dysfunction of lower extremity: Secondary | ICD-10-CM | POA: Diagnosis not present

## 2020-10-15 DIAGNOSIS — G8929 Other chronic pain: Secondary | ICD-10-CM

## 2020-10-15 DIAGNOSIS — S83411A Sprain of medial collateral ligament of right knee, initial encounter: Secondary | ICD-10-CM | POA: Diagnosis not present

## 2020-10-15 DIAGNOSIS — M25561 Pain in right knee: Secondary | ICD-10-CM | POA: Diagnosis not present

## 2020-10-15 DIAGNOSIS — M5441 Lumbago with sciatica, right side: Secondary | ICD-10-CM | POA: Diagnosis not present

## 2020-10-15 DIAGNOSIS — M5414 Radiculopathy, thoracic region: Secondary | ICD-10-CM | POA: Diagnosis not present

## 2020-10-15 DIAGNOSIS — M9902 Segmental and somatic dysfunction of thoracic region: Secondary | ICD-10-CM | POA: Diagnosis not present

## 2020-10-15 DIAGNOSIS — M9903 Segmental and somatic dysfunction of lumbar region: Secondary | ICD-10-CM | POA: Diagnosis not present

## 2020-10-15 NOTE — Progress Notes (Signed)
[name] presents to clinic today for Gelsyn injection right knee 1/3  Procedure: Real-time Ultrasound Guided Injection of right knee superior lateral patellar space Device: Philips Affiniti 50G Images permanently stored and available for review in PACS Verbal informed consent obtained.  Discussed risks and benefits of procedure. Warned about infection bleeding damage to structures skin hypopigmentation and fat atrophy among others. Patient expresses understanding and agreement Time-out conducted.   Noted no overlying erythema, induration, or other signs of local infection.   Skin prepped in a sterile fashion.   Local anesthesia: Topical Ethyl chloride.   With sterile technique and under real time ultrasound guidance: Gelsyn 16.8 mg injected into joint capsule. Fluid seen entering the knee joint.   Completed without difficulty   Spinal needle used Advised to call if fevers/chills, erythema, induration, drainage, or persistent bleeding.   Images permanently stored and available for review in the ultrasound unit.  Impression: Technically successful ultrasound guided injection.  Lot number: [id number]  Return in 1 week for Gelsyn injection 2/3   Prior to the injection we had an extensive discussion about pros and cons of injection discussed treatment plan options.  Discussion lasted approximately 11 minutes independent of the injection.

## 2020-10-15 NOTE — Patient Instructions (Addendum)
Good to see you today.  You had your 1st of 3 Gelsyn injections today.  Call or go to the ER if you develop a large red swollen joint with extreme pain or oozing puss.   Please make sure you schedule an appointment next week and the week following for your 2nd and 3rd injections in your R knee.

## 2020-10-20 ENCOUNTER — Other Ambulatory Visit (HOSPITAL_COMMUNITY): Payer: No Typology Code available for payment source

## 2020-10-20 DIAGNOSIS — R918 Other nonspecific abnormal finding of lung field: Secondary | ICD-10-CM | POA: Insufficient documentation

## 2020-10-20 NOTE — Assessment & Plan Note (Signed)
Encouraged on CPAP usage.  Plan  Patient Instructions  Set up PFT.  CT chest in 2 months  Continue on Advair 1 puff Twice daily  , rinse after use.  Begin Delsym 2 tsp Twice daily  For cough as needed  Use Tessalon Three times a day  For cough As needed   Add Pepcid 20mg  At bedtime  For 2 weeks .  Follow up with Rheumatology next month as planned  Follow up with Dr. Elsworth Soho  2 months to review CT .  Please contact office for sooner follow up if symptoms do not improve or worsen or seek emergency care   Begin Losartan 25mg  daily , follow up with Dr. [name] for blood pressure management .

## 2020-10-20 NOTE — Assessment & Plan Note (Signed)
Painful skin nodules questionable etiology positive ANA.  Patient has upcoming rheumatology appointment keep this as recommended

## 2020-10-20 NOTE — Assessment & Plan Note (Signed)
Scattered pulmonary nodules and enlarged mediastinal and hilar lymph nodes questionable etiology. Pulmonary function testing is pending.  ACE level was normal.  ANA was positive.  She has an upcoming appointment with rheumatology.  We will continue to follow pulmonary nodules serially.  Too small to biopsy at this time. Case discussed with Dr. Elsworth Soho

## 2020-10-20 NOTE — Assessment & Plan Note (Signed)
Cough questionable etiology.  Check PFT.  Continue off ACE inhibitor.  Adding cough control regimen.  Treat for GERD.\  Plan Patient Instructions  Set up PFT.  CT chest in 2 months  Continue on Advair 1 puff Twice daily  , rinse after use.  Begin Delsym 2 tsp Twice daily  For cough as needed  Use Tessalon Three times a day  For cough As needed   Add Pepcid 20mg  At bedtime  For 2 weeks .  Follow up with Rheumatology next month as planned  Follow up with Dr. Elsworth Soho  2 months to review CT .  Please contact office for sooner follow up if symptoms do not improve or worsen or seek emergency care   Begin Losartan 25mg  daily , follow up with Dr. [name] for blood pressure management .

## 2020-10-20 NOTE — Assessment & Plan Note (Signed)
Blood pressure has been increased since stopping her ACE inhibitor.  We will begin losartan 25 mg daily.  Have her follow-up with primary care provider keep blood pressure log and bring to next office visit with PCP.

## 2020-10-21 ENCOUNTER — Encounter: Payer: Self-pay | Admitting: Internal Medicine

## 2020-10-22 ENCOUNTER — Ambulatory Visit: Payer: Self-pay

## 2020-10-22 ENCOUNTER — Ambulatory Visit (INDEPENDENT_AMBULATORY_CARE_PROVIDER_SITE_OTHER): Payer: 59 | Admitting: Family Medicine

## 2020-10-22 ENCOUNTER — Other Ambulatory Visit: Payer: Self-pay

## 2020-10-22 DIAGNOSIS — M25561 Pain in right knee: Secondary | ICD-10-CM

## 2020-10-22 DIAGNOSIS — G8929 Other chronic pain: Secondary | ICD-10-CM

## 2020-10-22 DIAGNOSIS — M1711 Unilateral primary osteoarthritis, right knee: Secondary | ICD-10-CM

## 2020-10-22 NOTE — Patient Instructions (Addendum)
Thank you for coming in today.   You received a gel injection in your right knee today. Call or go to the ER if you develop a large red swollen joint with extreme pain or oozing puss.    See you the 23rd for your 3rd gel injection.

## 2020-10-22 NOTE — Progress Notes (Signed)
[name] presents to clinic today for Gelsyn injection right knee 2/3  Procedure: Real-time Ultrasound Guided Injection of right knee superior lateral patellar space Device: Philips Affiniti 50G Images permanently stored and available for review in PACS Verbal informed consent obtained.  Discussed risks and benefits of procedure. Warned about infection bleeding damage to structures skin hypopigmentation and fat atrophy among others. Patient expresses understanding and agreement Time-out conducted.   Noted no overlying erythema, induration, or other signs of local infection.   Skin prepped in a sterile fashion.   Local anesthesia: Topical Ethyl chloride.   With sterile technique and under real time ultrasound guidance: Gelsyn 16.8 mg injected into knee joint. Fluid seen entering the joint capsule.   Completed without difficulty   Spinal needle used Advised to call if fevers/chills, erythema, induration, drainage, or persistent bleeding.   Images permanently stored and available for review in the ultrasound unit.  Impression: Technically successful ultrasound guided injection.  Lot # [id number]  [name] experienced pain with the injection.  And then after experiencing the pain she had a vagal response where she became lightheaded and nauseated.  She did not have a syncopal event and recovered quickly with some water and crackers.  Return in 1 week for Gelsyn injection right knee 3/3.  We will try to modify the location a little bit for the next injection to experience less pain.

## 2020-10-23 ENCOUNTER — Ambulatory Visit: Payer: No Typology Code available for payment source | Admitting: Primary Care

## 2020-10-29 ENCOUNTER — Ambulatory Visit (INDEPENDENT_AMBULATORY_CARE_PROVIDER_SITE_OTHER): Payer: 59 | Admitting: Family Medicine

## 2020-10-29 ENCOUNTER — Other Ambulatory Visit: Payer: Self-pay

## 2020-10-29 ENCOUNTER — Ambulatory Visit: Payer: Self-pay

## 2020-10-29 DIAGNOSIS — M1711 Unilateral primary osteoarthritis, right knee: Secondary | ICD-10-CM | POA: Diagnosis not present

## 2020-10-29 DIAGNOSIS — G8929 Other chronic pain: Secondary | ICD-10-CM

## 2020-10-29 DIAGNOSIS — M25561 Pain in right knee: Secondary | ICD-10-CM | POA: Diagnosis not present

## 2020-10-29 NOTE — Patient Instructions (Addendum)
Thank you for coming in today.   Call or go to the ER if you develop a large red swollen joint with extreme pain or oozing puss.    Recheck as needed.   You should hear from the [facility] rep about the knee brace.

## 2020-10-29 NOTE — Progress Notes (Signed)
Office Visit Note  Patient: [name]             Date of Birth: 15-Oct-1970           MRN: [id number]             PCP: Biagio Borg, MD Referring: Rigoberto Noel, MD Visit Date: 11/11/2020 Occupation: _0 @  Subjective:   Lung nodules and positive ANA.   History of Present Illness: [name] is a 50 y.o. female seen in consultation per request of Dr. Elsworth Soho.  According to the patient in January 2022 she developed upper respiratory tract infection with cough and congestion.  She states she had COVID test 2 times which were negative.  She was exposed to her father who had COVID-19 infection.  She states that at the same time she noticed some lymph nodes on her bilateral forearms and some knots on her elbows wrist and hands she also had knots on the plantar surface of her feet and a rash on her legs.  The lymph node subsided and left her with some bruising.  She still have some knots.  She stopped was told that they were most likely reactive to the infection.  Her cough persists and she was switched from lisinopril to losartan which helped her to some extent.  She has been also using Advair inhaler which helps.  She still have some dry cough.  She was referred to Dr. Elsworth Soho due to persistent cough.  She is also seeing her oncologist.  She had CT chest and abdomen which showed multiple nodules.  She continues to have extreme fatigue.  She also has history of sleep apnea and uses a CPAP.  She gives history of oral ulcers, dry mouth, dry eyes, swollen glands and parotid swelling.  She complains of pain and discomfort in her bilateral knee joints and her bilateral ankles.  She has some lower back pain.  There is no family history of autoimmune disease.  She is gravida 0, para 0.  No history of DVT.  Activities of Daily Living:  Patient reports morning stiffness for 15 minutes.   Patient Reports nocturnal pain.  Difficulty dressing/grooming: Denies Difficulty climbing stairs:  Reports Difficulty getting out of chair: Reports Difficulty using hands for taps, buttons, cutlery, and/or writing: Denies  Review of Systems  Constitutional:  Positive for fatigue.  HENT:  Positive for mouth sores, mouth dryness and nose dryness.   Eyes:  Positive for itching and dryness. Negative for pain.  Respiratory:  Positive for shortness of breath and difficulty breathing.   Cardiovascular:  Negative for chest pain and palpitations.  Gastrointestinal:  Positive for constipation, diarrhea and heartburn. Negative for blood in stool.  Endocrine: Negative for increased urination.  Genitourinary:  Negative for difficulty urinating.  Musculoskeletal:  Positive for joint pain, joint pain, joint swelling and morning stiffness. Negative for myalgias, muscle tenderness and myalgias.  Skin:  Positive for rash. Negative for color change and sensitivity to sunlight.  Allergic/Immunologic: Positive for susceptible to infections.  Neurological:  Positive for dizziness, numbness, headaches and parasthesias.  Hematological:  Positive for bruising/bleeding tendency and swollen glands.  Psychiatric/Behavioral:  Positive for depressed mood and sleep disturbance. The patient is nervous/anxious.    PMFS History:  Patient Active Problem List   Diagnosis Date Noted   Pulmonary nodules 10/20/2020   Multiple skin nodules 07/24/2020   Viral illness 06/14/2020   Right lumbar radiculopathy 19/37/9024   Periumbilical hernia 09/73/5329   TB lung, latent  05/18/2020   Acute non-recurrent maxillary sinusitis 11/09/2019   HTN (hypertension) 04/14/2019   Mass of left wrist 04/14/2019   External otitis of right ear 04/14/2019   Choledocholithiasis    Abnormal cholangiogram    Cholecystitis with cholelithiasis 07/24/2018   Hyperglycemia 10/16/2017   Hot flash not due to menopause 10/16/2017   Eczema 10/16/2017   Flu-like symptoms 06/20/2017   Cognitive changes 01/30/2017   Disturbed concentration  01/26/2017   Left shoulder pain 11/01/2016   Right low back pain 11/01/2016   Elevated blood pressure, situational 05/05/2016   Surgical menopause 01/29/2016   OSA on CPAP 12/22/2015   Cough 11/26/2015   Capsulitis 10/06/2015   Ganglion cyst of left foot 10/06/2015   Fatigue 09/13/2015   Asthma 09/08/2015   Exertional dyspnea 09/08/2015   Peroneal ganglion cyst 07/20/2015   Low back pain 07/20/2015   Peroneal tendinitis of left lower leg 06/25/2015   Nonallopathic lesion of cervical region 06/25/2015   Wheezing 05/08/2015   Polyarthralgia 05/08/2015   Peripheral edema 03/16/2015   Central line complication 19/37/9024   Morbid obesity with BMI of 45.0-49.9, adult ([place]) 02/28/2015   Chemotherapy induced neutropenia ([place]) 02/28/2015   Genetic testing 01/05/2015   Chemotherapy induced nausea and vomiting 01/02/2015   Chemotherapy-induced peripheral neuropathy ([place]) 01/02/2015   Premature surgical menopause 12/17/2014   Leukopenia due to antineoplastic chemotherapy ([place]) 12/17/2014   Port-A-Cath in place 12/17/2014   Encounter for antineoplastic chemotherapy 12/17/2014   Myalgia 12/09/2014   Hypersensitivity reaction 12/05/2014   Poor venous access 11/28/2014   Postoperative cellulitis of surgical wound 11/24/2014   Tobacco dependence 11/12/2014   Malignant neoplasm of both ovaries 11/10/2014   Anxiety 11/10/2014   Adnexal mass 11/10/2014   Encounter for well adult exam with abnormal findings 09/10/2014   Hypothyroidism 09/10/2014   Hypersomnolence 09/10/2014   Acute non-recurrent frontal sinusitis 06/24/2014   Right knee pain 06/24/2014   Lower back pain 01/08/2014   EAR PAIN, BILATERAL 12/18/2009   KNEE PAIN, RIGHT 12/18/2009   INGROWN TOENAIL 12/17/2008   BACK PAIN 12/17/2008   Acute bronchospasm 11/11/2008   Depression 11/29/2007   Insomnia 11/29/2007   Allergic rhinitis 04/13/2007   GERD 04/13/2007   Endometriosis determined by laparoscopy 04/13/2007   Migraine  headache 01/29/2007    Past Medical History:  Diagnosis Date   Anxiety    Elevated blood pressure, situational 05/05/2016   Endometriosis    Exercise-induced asthma 09/08/2015   Ovarian cancer, bilateral 11/28/2014   PONV (postoperative nausea and vomiting)    Thyroid disease    Umbilical hernia     Family History  Problem Relation Age of Onset   Hypertension Mother    Diabetes Mother    Stroke Mother        Deceased   Heart disease Mother    Osteoarthritis Mother    Atrial fibrillation Father        Living   Stroke Father        optic nerve left eye   Healthy Brother    Diabetes Maternal Aunt    Heart failure Maternal Grandmother    Heart attack Maternal Grandfather    Dementia Paternal Grandmother    Dementia Paternal Grandfather    Past Surgical History:  Procedure Laterality Date   ABDOMINAL HYSTERECTOMY  11/10/2014   at [facility], Exp lap, supracervical hyst, BSO, infracolic omentectomy, lymphadenectomy, aortic lymph node sampling   CHOLECYSTECTOMY N/A 07/24/2018   Procedure: LAPAROSCOPIC CHOLECYSTECTOMY WITH INTRAOPERATIVE CHOLANGIOGRAM;  Surgeon: Armandina Gemma,  MD;  Location: [facility];  Service: General;  Laterality: N/A;   ERCP N/A 07/26/2018   Procedure: ENDOSCOPIC RETROGRADE CHOLANGIOPANCREATOGRAPHY (ERCP);  Surgeon: Ladene Artist, MD;  Location: [facility];  Service: Endoscopy;  Laterality: N/A;   GANGLION CYST EXCISION Right    hand   IR GENERIC HISTORICAL  05/12/2016   IR REMOVAL TUN ACCESS W/ PORT W/O FL MOD SED 05/12/2016 [facility]   LIPOMA EXCISION Left    ankle   MENISCUS REPAIR Right    REMOVAL OF STONES  07/26/2018   Procedure: REMOVAL OF STONES;  Surgeon: Ladene Artist, MD;  Location: [facility];  Service: Endoscopy;;   SPHINCTEROTOMY  07/26/2018   Procedure: Joan Mayans;  Surgeon: Ladene Artist, MD;  Location: [facility];  Service: Endoscopy;;   Social History   Social History Narrative   Lives with boyfriend in a one story home.   Has no children.     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     Education: college.   Right handed    Immunization History  Administered Date(s) Administered   Influenza,inj,Quad PF,6+ Mos 01/02/2014, 03/26/2015, 12/25/2017, 01/21/2020   Influenza-Unspecified 02/23/2016, 02/09/2017, 02/12/2020   MMR 02/09/2017, 07/03/2017   PFIZER(Purple Top)SARS-COV-2 Vaccination 08/01/2019, 08/22/2019, 02/22/2020   Tdap 11/07/2017   Tetanus 12/21/2015     Objective: Vital Signs: BP 135/87 (BP Location: Right Wrist, Patient Position: Sitting, Cuff Size: Normal)   Pulse 83   Ht 5' 6.75" (1.695 m)   Wt (!) 311 lb 9.6 oz (141.3 kg)   LMP 11/02/2014   BMI 49.17 kg/m    Physical Exam Vitals and nursing note reviewed.  Constitutional:      Appearance: She is well-developed.  HENT:     Head: Normocephalic and atraumatic.     Mouth/Throat:     Comments: Right parotid swelling was noted. Eyes:     Conjunctiva/sclera: Conjunctivae normal.  Cardiovascular:     Rate and Rhythm: Normal rate and regular rhythm.     Heart sounds: Normal heart sounds.  Pulmonary:     Effort: Pulmonary effort is normal.     Breath sounds: Normal breath sounds.  Abdominal:     General: Bowel sounds are normal.     Palpations: Abdomen is soft.  Musculoskeletal:     Cervical back: Normal range of motion.  Lymphadenopathy:     Cervical: No cervical adenopathy.  Skin:    General: Skin is warm and dry.     Capillary Refill: Capillary refill takes less than 2 seconds.  Neurological:     Mental Status: She is alert and oriented to person, place, and time.  Psychiatric:        Behavior: Behavior normal.     Musculoskeletal Exam: C-spine was in good range of motion.  She is in discomfort range of motion of lumbar spine.  Shoulder joints, elbow joints, wrist joints, MCPs PIPs and DIPs with good range of motion with no synovitis.  Hip joints, knee joints, ankles with good range of motion with no synovitis.  She was no tenderness over  ankles or MTPs.  CDAI Exam: CDAI Score: -- Patient Global: --; Provider Global: -- Swollen: --; Tender: -- Joint Exam 11/11/2020   No joint exam has been documented for this visit   There is currently no information documented on the homunculus. Go to the Rheumatology activity and complete the homunculus joint exam.  Investigation: No additional findings.  Imaging: [place] LIMITED JOINT SPACE STRUCTURES LOW RIGHT(NO LINKED CHARGES)  Result  Date: 11/05/2020 Formatting of this result is different from the original. Cayce presents to clinic today for Gelsyn injection right knee 3/3.   Procedure: Real-time Ultrasound Guided Injection of right knee superior patella space Device: Philips Affiniti 50G Images permanently stored and available for review in PACS Verbal informed consent obtained.  Discussed risks and benefits of procedure. Warned about infection bleeding damage to structures skin hypopigmentation and fat atrophy among others. Patient expresses understanding and agreement Time-out conducted.   Noted no overlying erythema, induration, or other signs of local infection.   Skin prepped in a sterile fashion.   Local anesthesia: Topical Ethyl chloride.   With sterile technique and under real time ultrasound guidance:  Gelsyn 16.31m injected into knee joint. Fluid seen entering the joint capsule.   Completed without difficulty   Spinal needle used.   Advised to call if fevers/chills, erythema, induration, drainage, or persistent bleeding.   Images permanently stored and available for review in the ultrasound unit. Impression: Technically successful ultrasound guided injection.     Lot number: [id number]   Recheck as needed   Following the injection ARonanhad some questions about exercise and knee braces. After reviewing her x-rays she has more significant lateral compartment DJD On exam she does have a little lateral joint instability and would be a great candidate for a lateral off loader knee brace.  After discussion we will contact the [employer]   Additionally discussed exercise.  Currently she is walking for exercise but would like to run.  I stated that running is going to be challenging if that is her only form of exercise but a little bit of running here and there mixed in with her walking is probably okay.   Total discussion time and chart review independent of injection 10 minutes  [place] JOINT SPACE STRUCTURES LOW RIGHT(NO LINKED CHARGES)  Result Date: 11/05/2020 Formatting of this result is different from the original. ANiasiapresents to clinic today for Gelsyn injection right knee 2/3   Procedure: Real-time Ultrasound Guided Injection of right knee superior lateral patellar space Device: Philips Affiniti 50G Images permanently stored and available for review in PACS Verbal informed consent obtained.  Discussed risks and benefits of procedure. Warned about infection bleeding damage to structures skin hypopigmentation and fat atrophy among others. Patient expresses understanding and agreement Time-out conducted.   Noted no overlying erythema, induration, or other signs of local infection.   Skin prepped in a sterile fashion.   Local anesthesia: Topical Ethyl chloride.   With sterile technique and under real time ultrasound guidance: Gelsyn 16.8 mg injected into knee joint. Fluid seen entering the joint capsule.   Completed without difficulty   Spinal needle used Advised to call if fevers/chills, erythema, induration, drainage, or persistent bleeding.   Images permanently stored and available for review in the ultrasound unit. Impression: Technically successful ultrasound guided injection.   Lot # [id number]  AZaydeeexperienced pain with the injection.  And then after experiencing the pain she had a vagal response where she became lightheaded and nauseated.  She did not have a syncopal event and recovered quickly with some water and crackers.   Return in 1 week for Gelsyn injection  right knee 3/3.  We will try to modify the location a little bit for the next injection to experience less pain.  [place] JOINT SPACE STRUCTURES LOW RIGHT(NO LINKED CHARGES)  Result Date: 11/05/2020 Formatting of this result is different from the original. AIngrapresents to clinic today  for Gelsyn injection right knee 1/3   Procedure: Real-time Ultrasound Guided Injection of right knee superior lateral patellar space Device: Philips Affiniti 50G Images permanently stored and available for review in PACS Verbal informed consent obtained.  Discussed risks and benefits of procedure. Warned about infection bleeding damage to structures skin hypopigmentation and fat atrophy among others. Patient expresses understanding and agreement Time-out conducted.   Noted no overlying erythema, induration, or other signs of local infection.   Skin prepped in a sterile fashion.   Local anesthesia: Topical Ethyl chloride.   With sterile technique and under real time ultrasound guidance: Gelsyn 16.8 mg injected into joint capsule. Fluid seen entering the knee joint.   Completed without difficulty   Spinal needle used Advised to call if fevers/chills, erythema, induration, drainage, or persistent bleeding.   Images permanently stored and available for review in the ultrasound unit. Impression: Technically successful ultrasound guided injection.   Lot number: [id number]   Return in 1 week for Gelsyn injection 2/3     Prior to the injection we had an extensive discussion about pros and cons of injection discussed treatment plan options.  Discussion lasted approximately 11 minutes independent of the injection.   Recent Labs: Lab Results  Component Value Date   WBC 9.4 06/15/2020   HGB 14.3 06/15/2020   PLT 308.0 06/15/2020   NA 138 09/07/2020   K 4.0 09/07/2020   CL 100 09/07/2020   CO2 28 09/07/2020   GLUCOSE 88 09/07/2020   BUN 14 09/07/2020   CREATININE 0.77 09/07/2020   BILITOT 1.0 06/15/2020   ALKPHOS 56  06/15/2020   AST 34 06/15/2020   ALT 39 (H) 06/15/2020   PROT 7.7 06/15/2020   ALBUMIN 4.3 06/15/2020   CALCIUM 9.7 09/07/2020   GFRAA >60 07/19/2019   Sep 10, 2020 CA125 negative, ANA 1: 1280 cytoplasmic, ACE16  Speciality Comments: No specialty comments available.  Procedures:  No procedures performed Allergies: Moxifloxacin, Quinolones, Doxycycline, Escitalopram oxalate, Lisinopril, and Sulfonamide derivatives   Assessment / Plan:     Visit Diagnoses: Multiple lung nodules - 09/14/20: Increasing prominence of mediastinal and hilar lymph nodes sincethe previous exam. Findings could be seen in the setting of sarcoidosis, lymphoproliferative disease.  There is questionable history of nodules on her skin versus lymph nodes.  Which resolved after leaving bruising per patient.  That raises concern about possible erythema nodosum lesions.  I did not witness any nodules on my examination today.  It will be hard to establish a diagnosis without a biopsy.  Patient was evaluated by Dr. Elsworth Soho.  The labs showed positive ANA and was referred to me.  Her ACE level was normal.  She will need a lymph node biopsy to confirm the diagnosis.  She has a positive TB Gold in the past.  Positive ANA (antinuclear antibody) - 09/10/20: ANA 1:1280 cytoplasmic, reticular/AMA, Ace 16.  I will obtain AVISE labs to evaluate positive ANA.  She gives history of sicca symptoms and frequent oral ulcers.  There is no history of Raynaud's phenomenon, photosensitivity, malar rash.  No synovitis was noted.  Primary osteoarthritis of both knees - She has x-ray of her right knee joint which showed moderate to severe lateral compartment narrowing.  She had viscosupplement injections by Dr. Georgina Snell.  Swelling of right parotid gland-right parotid fullness was noted.  I will refer her to ENT for further evaluation.  Other fatigue -she gives history of extreme fatigue.  She has history of obstructive sleep apnea and uses CPAP.  Plan: CK,  Sedimentation rate, Serum protein electrophoresis with reflex, IgG, IgA, IgM, COMPLETE METABOLIC PANEL WITH GFR  TB lung, latent-patient states her TB test was positive in the past.  Right lumbar radiculopathy-she has chronic lower back pain.  Malignant neoplasm of both ovaries - 11/09/2014 TAHBSO, CTX X 6 months.  Patient states that she was released by her oncologist.  Chemotherapy-induced peripheral neuropathy ([place])  Primary hypertension-blood pressure is mildly elevated today.  History of gastroesophageal reflux (GERD)  OSA on CPAP  Mild intermittent asthma with acute exacerbation  Acquired hypothyroidism  Migraine without aura and with status migrainosus, not intractable  Surgical menopause  Orders: Orders Placed This Encounter  Procedures   CK   Sedimentation rate   Serum protein electrophoresis with reflex   IgG, IgA, IgM   COMPLETE METABOLIC PANEL WITH GFR   QuantiFERON-TB Gold Plus   Ambulatory referral to ENT    No orders of the defined types were placed in this encounter.    Follow-Up Instructions: Return for Pulmonary nodulosis, positive ANA.   Bo Merino, MD  Note - This record has been created using Editor, commissioning.  Chart creation errors have been sought, but may not always  have been located. Such creation errors do not reflect on  the standard of medical care.

## 2020-10-29 NOTE — Progress Notes (Addendum)
Isabellarose presents to clinic today for Gelsyn injection right knee 3/3.  Procedure: Real-time Ultrasound Guided Injection of right knee superior patella space  Device: Philips Affiniti 50G Images permanently stored and available for review in PACS Verbal informed consent obtained.  Discussed risks and benefits of procedure. Warned about infection bleeding damage to structures skin hypopigmentation and fat atrophy among others. Patient expresses understanding and agreement Time-out conducted.   Noted no overlying erythema, induration, or other signs of local infection.   Skin prepped in a sterile fashion.   Local anesthesia: Topical Ethyl chloride.   With sterile technique and under real time ultrasound guidance:  Gelsyn 16.8mg  injected into knee joint. Fluid seen entering the joint capsule.   Completed without difficulty   Spinal needle used.   Advised to call if fevers/chills, erythema, induration, drainage, or persistent bleeding.   Images permanently stored and available for review in the ultrasound unit.  Impression: Technically successful ultrasound guided injection.   Lot number: [id number]  Recheck as needed  Following the injection Keliyah had some questions about exercise and knee braces. After reviewing her x-rays she has more significant lateral compartment DJD On exam she does have a little lateral joint instability and would be a great candidate for a lateral off loader knee brace. After discussion we will contact the [employer]. Custom knee brace due to quad to calf ratio.   Additionally discussed exercise.  Currently she is walking for exercise but would like to run.  I stated that running is going to be challenging if that is her only form of exercise but a little bit of running here and there mixed in with her walking is probably okay.  Total discussion time and chart review independent of injection 10 minutes

## 2020-11-11 ENCOUNTER — Other Ambulatory Visit: Payer: Self-pay

## 2020-11-11 ENCOUNTER — Encounter: Payer: Self-pay | Admitting: Oncology

## 2020-11-11 ENCOUNTER — Ambulatory Visit (INDEPENDENT_AMBULATORY_CARE_PROVIDER_SITE_OTHER): Payer: 59 | Admitting: Rheumatology

## 2020-11-11 ENCOUNTER — Encounter: Payer: Self-pay | Admitting: Rheumatology

## 2020-11-11 VITALS — BP 135/87 | HR 83 | Ht 66.75 in | Wt 311.6 lb

## 2020-11-11 DIAGNOSIS — M170 Bilateral primary osteoarthritis of knee: Secondary | ICD-10-CM

## 2020-11-11 DIAGNOSIS — R600 Localized edema: Secondary | ICD-10-CM

## 2020-11-11 DIAGNOSIS — Z227 Latent tuberculosis: Secondary | ICD-10-CM

## 2020-11-11 DIAGNOSIS — K8043 Calculus of bile duct with acute cholecystitis with obstruction: Secondary | ICD-10-CM

## 2020-11-11 DIAGNOSIS — E8940 Asymptomatic postprocedural ovarian failure: Secondary | ICD-10-CM

## 2020-11-11 DIAGNOSIS — G5603 Carpal tunnel syndrome, bilateral upper limbs: Secondary | ICD-10-CM

## 2020-11-11 DIAGNOSIS — I10 Essential (primary) hypertension: Secondary | ICD-10-CM

## 2020-11-11 DIAGNOSIS — M5416 Radiculopathy, lumbar region: Secondary | ICD-10-CM

## 2020-11-11 DIAGNOSIS — R918 Other nonspecific abnormal finding of lung field: Secondary | ICD-10-CM | POA: Diagnosis not present

## 2020-11-11 DIAGNOSIS — G620 Drug-induced polyneuropathy: Secondary | ICD-10-CM

## 2020-11-11 DIAGNOSIS — R5383 Other fatigue: Secondary | ICD-10-CM

## 2020-11-11 DIAGNOSIS — G4733 Obstructive sleep apnea (adult) (pediatric): Secondary | ICD-10-CM

## 2020-11-11 DIAGNOSIS — T451X5A Adverse effect of antineoplastic and immunosuppressive drugs, initial encounter: Secondary | ICD-10-CM

## 2020-11-11 DIAGNOSIS — E039 Hypothyroidism, unspecified: Secondary | ICD-10-CM

## 2020-11-11 DIAGNOSIS — N809 Endometriosis, unspecified: Secondary | ICD-10-CM

## 2020-11-11 DIAGNOSIS — Z9989 Dependence on other enabling machines and devices: Secondary | ICD-10-CM

## 2020-11-11 DIAGNOSIS — R768 Other specified abnormal immunological findings in serum: Secondary | ICD-10-CM

## 2020-11-11 DIAGNOSIS — C563 Malignant neoplasm of bilateral ovaries: Secondary | ICD-10-CM

## 2020-11-11 DIAGNOSIS — M999 Biomechanical lesion, unspecified: Secondary | ICD-10-CM

## 2020-11-11 DIAGNOSIS — Z8719 Personal history of other diseases of the digestive system: Secondary | ICD-10-CM

## 2020-11-11 DIAGNOSIS — J4521 Mild intermittent asthma with (acute) exacerbation: Secondary | ICD-10-CM

## 2020-11-11 DIAGNOSIS — Z95828 Presence of other vascular implants and grafts: Secondary | ICD-10-CM

## 2020-11-11 DIAGNOSIS — G43001 Migraine without aura, not intractable, with status migrainosus: Secondary | ICD-10-CM

## 2020-11-13 DIAGNOSIS — M1711 Unilateral primary osteoarthritis, right knee: Secondary | ICD-10-CM | POA: Diagnosis not present

## 2020-11-16 ENCOUNTER — Telehealth: Payer: Self-pay | Admitting: Oncology

## 2020-11-16 NOTE — Telephone Encounter (Signed)
[name] that Dr. Alvy Bimler thinks she needs to follow with Dr. Elsworth Soho for the new pulmonary nodules and prominence of mediastinal and hilar lymph nodes on CT from 09/15/20.  She verbalized understanding.

## 2020-11-17 LAB — SEDIMENTATION RATE: Sed Rate: 6 mm/h (ref 0–20)

## 2020-11-17 LAB — PROTEIN ELECTROPHORESIS, SERUM, WITH REFLEX
Albumin ELP: 4.2 g/dL (ref 3.8–4.8)
Alpha 1: 0.3 g/dL (ref 0.2–0.3)
Alpha 2: 0.6 g/dL (ref 0.5–0.9)
Beta 2: 0.3 g/dL (ref 0.2–0.5)
Beta Globulin: 0.4 g/dL (ref 0.4–0.6)
Gamma Globulin: 1.2 g/dL (ref 0.8–1.7)
Total Protein: 7 g/dL (ref 6.1–8.1)

## 2020-11-17 LAB — COMPLETE METABOLIC PANEL WITH GFR
AG Ratio: 1.7 (calc) (ref 1.0–2.5)
ALT: 15 U/L (ref 6–29)
AST: 15 U/L (ref 10–35)
Albumin: 4.4 g/dL (ref 3.6–5.1)
Alkaline phosphatase (APISO): 53 U/L (ref 31–125)
BUN: 10 mg/dL (ref 7–25)
CO2: 26 mmol/L (ref 20–32)
Calcium: 9.2 mg/dL (ref 8.6–10.2)
Chloride: 105 mmol/L (ref 98–110)
Creat: 0.74 mg/dL (ref 0.50–1.10)
GFR, Est African American: 110 mL/min/{1.73_m2} (ref >=60)
GFR, Est Non African American: 95 mL/min/{1.73_m2} (ref >=60)
Globulin: 2.6 g/dL (calc) (ref 1.9–3.7)
Glucose, Bld: 96 mg/dL (ref 65–99)
Potassium: 4 mmol/L (ref 3.5–5.3)
Sodium: 140 mmol/L (ref 135–146)
Total Bilirubin: 0.9 mg/dL (ref 0.2–1.2)
Total Protein: 7 g/dL (ref 6.1–8.1)

## 2020-11-17 LAB — QUANTIFERON-TB GOLD PLUS
Mitogen-NIL: >10 IU/mL
NIL: 0.03 IU/mL
QuantiFERON-TB Gold Plus: NEGATIVE
TB1-NIL: 0.06 IU/mL
TB2-NIL: 0.06 IU/mL

## 2020-11-17 LAB — IFE INTERPRETATION: Immunofix Electr Int: NOT DETECTED

## 2020-11-17 LAB — CK: Total CK: 52 U/L (ref 29–143)

## 2020-11-17 LAB — IGG, IGA, IGM
IgG (Immunoglobin G), Serum: 1345 mg/dL (ref 600–1640)
IgM, Serum: 64 mg/dL (ref 50–300)
Immunoglobulin A: 204 mg/dL (ref 47–310)

## 2020-11-17 NOTE — Progress Notes (Signed)
All the labs are within normal limits.  I will discuss results at the follow-up visit.

## 2020-11-18 DIAGNOSIS — R5383 Other fatigue: Secondary | ICD-10-CM | POA: Diagnosis not present

## 2020-11-18 DIAGNOSIS — R768 Other specified abnormal immunological findings in serum: Secondary | ICD-10-CM | POA: Diagnosis not present

## 2020-11-23 ENCOUNTER — Other Ambulatory Visit (HOSPITAL_COMMUNITY): Payer: Self-pay

## 2020-11-23 ENCOUNTER — Telehealth: Payer: 59 | Admitting: Nurse Practitioner

## 2020-11-23 DIAGNOSIS — J0110 Acute frontal sinusitis, unspecified: Secondary | ICD-10-CM | POA: Diagnosis not present

## 2020-11-23 MED ORDER — AMOXICILLIN-POT CLAVULANATE 875-125 MG PO TABS
1.0000 | ORAL_TABLET | Freq: Two times a day (BID) | ORAL | 0 refills | Status: AC
  Filled 2020-11-23: qty 20, 10d supply, fill #0

## 2020-11-23 NOTE — Progress Notes (Signed)
Virtual Visit Consent   [name], you are scheduled for a virtual visit with a [facility] provider today.     Just as with appointments in the office, your consent must be obtained to participate.  Your consent will be active for this visit and any virtual visit you may have with one of our providers in the next 365 days.     If you have a MyChart account, a copy of this consent can be sent to you electronically.  All virtual visits are billed to your insurance company just like a traditional visit in the office.    As this is a virtual visit, video technology does not allow for your provider to perform a traditional examination.  This may limit your provider's ability to fully assess your condition.  If your provider identifies any concerns that need to be evaluated in person or the need to arrange testing (such as labs, EKG, etc.), we will make arrangements to do so.     Although advances in technology are sophisticated, we cannot ensure that it will always work on either your end or our end.  If the connection with a video visit is poor, the visit may have to be switched to a telephone visit.  With either a video or telephone visit, we are not always able to ensure that we have a secure connection.     I need to obtain your verbal consent now.   Are you willing to proceed with your visit today?    Peta Peachey has provided verbal consent on 11/23/2020 for a virtual visit (video or telephone).   Apolonio Schneiders, FNP   Date: 11/23/2020 1:31 PM   Virtual Visit via Video Note   I, Apolonio Schneiders, connected with  [name]  ([id number], 1971/04/05) on 11/23/20 at  1:30 PM EDT by a video-enabled telemedicine application and verified that I am speaking with the correct person using two identifiers.  Location: Patient: Virtual Visit Location Patient: Home Provider: Virtual Visit Location Provider: Office/Clinic   I discussed the limitations of evaluation and management by  telemedicine and the availability of in person appointments. The patient expressed understanding and agreed to proceed.    History of Present Illness: [name] is a 50 y.o. who identifies as a female who was assigned female at birth, and is being seen today for swelling on the right side of her face that has been going on for the past few weeks. She was seen by her rheumatologist who referred her to an ENT but appointment is not until August.   She has had ongoing sinus pressure that is worse when she sneezes or coughs. She did have a fever 2 days ago, and a low grade fever last night. Since symptom onset she has not been on an antibiotic. She has been using Dayquil for relief OTC. She does use Flonase and ocean nasal sprays daily.   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   HPI:  +fatigue+fever+muscle aches  +nasal congestion +sinus pressure  +headache -cough +change of taste  -loss of taste   Problems:  Patient Active Problem List   Diagnosis Date Noted   Pulmonary nodules 10/20/2020   Multiple skin nodules 07/24/2020   Viral illness 06/14/2020   Right lumbar radiculopathy 36/64/4034   Periumbilical hernia 74/25/9563   TB lung, latent 05/18/2020   Acute non-recurrent maxillary  sinusitis 11/09/2019   HTN (hypertension) 04/14/2019   Mass of left wrist 04/14/2019   External otitis of right ear 04/14/2019   Choledocholithiasis    Abnormal cholangiogram    Cholecystitis with cholelithiasis 07/24/2018   Hyperglycemia 10/16/2017   Hot flash not due to menopause 10/16/2017   Eczema 10/16/2017   Flu-like symptoms 06/20/2017   Cognitive changes 01/30/2017   Disturbed concentration 01/26/2017   Left shoulder pain 11/01/2016   Right low back pain 11/01/2016   Elevated blood pressure, situational 05/05/2016   Surgical menopause 01/29/2016   OSA on CPAP 12/22/2015   Cough  11/26/2015   Capsulitis 10/06/2015   Ganglion cyst of left foot 10/06/2015   Fatigue 09/13/2015   Asthma 09/08/2015   Exertional dyspnea 09/08/2015   Peroneal ganglion cyst 07/20/2015   Low back pain 07/20/2015   Peroneal tendinitis of left lower leg 06/25/2015   Nonallopathic lesion of cervical region 06/25/2015   Wheezing 05/08/2015   Polyarthralgia 05/08/2015   Peripheral edema 03/16/2015   Central line complication 96/22/2979   Morbid obesity with BMI of 45.0-49.9, adult ([place]) 02/28/2015   Chemotherapy induced neutropenia ([place]) 02/28/2015   Genetic testing 01/05/2015   Chemotherapy induced nausea and vomiting 01/02/2015   Chemotherapy-induced peripheral neuropathy ([place]) 01/02/2015   Premature surgical menopause 12/17/2014   Leukopenia due to antineoplastic chemotherapy ([place]) 12/17/2014   Port-A-Cath in place 12/17/2014   Encounter for antineoplastic chemotherapy 12/17/2014   Myalgia 12/09/2014   Hypersensitivity reaction 12/05/2014   Poor venous access 11/28/2014   Postoperative cellulitis of surgical wound 11/24/2014   Tobacco dependence 11/12/2014   Malignant neoplasm of both ovaries 11/10/2014   Anxiety 11/10/2014   Adnexal mass 11/10/2014   Encounter for well adult exam with abnormal findings 09/10/2014   Hypothyroidism 09/10/2014   Hypersomnolence 09/10/2014   Acute non-recurrent frontal sinusitis 06/24/2014   Right knee pain 06/24/2014   Lower back pain 01/08/2014   EAR PAIN, BILATERAL 12/18/2009   KNEE PAIN, RIGHT 12/18/2009   INGROWN TOENAIL 12/17/2008   BACK PAIN 12/17/2008   Acute bronchospasm 11/11/2008   Depression 11/29/2007   Insomnia 11/29/2007   Allergic rhinitis 04/13/2007   GERD 04/13/2007   Endometriosis determined by laparoscopy 04/13/2007   Migraine headache 01/29/2007    Allergies:  Allergies  Allergen Reactions   Moxifloxacin Anaphylaxis    needs epinephrine shot   Quinolones Anaphylaxis, Shortness Of Breath and Swelling   Doxycycline  Nausea And Vomiting   Escitalopram Oxalate Other (See Comments)     fatigue   Lisinopril Cough   Sulfonamide Derivatives Swelling and Rash    needs epinephrine shot--rash and lip swelling     Observations/Objective: Patient is well-developed, well-nourished in no acute distress.  Resting comfortably at home.  Head is normocephalic, atraumatic.  No labored breathing.  Speech is clear and coherent with logical content.  Patient is alert and oriented at baseline.    Assessment and Plan: 1. Acute non-recurrent frontal sinusitis  - amoxicillin-clavulanate (AUGMENTIN) 875-125 MG tablet; Take 1 tablet by mouth 2 (two) times daily for 10 days. Take with food  Dispense: 20 tablet; Refill: 0    Continue over the counter decongestants as directed.   If COVID results are positive please schedule follow up to discuss those results.    Follow Up Instructions: I discussed the assessment and treatment plan with the patient. The patient was provided an opportunity to ask questions and all were answered. The patient agreed with the plan and demonstrated an understanding of the instructions.  A copy of instructions were sent to the patient via MyChart.  The patient was advised to call back or seek an in-person evaluation if the symptoms worsen or if the condition fails to improve as anticipated.  Time:  I spent 15 minutes with the patient via telehealth technology discussing the above problems/concerns.    Apolonio Schneiders, FNP

## 2020-11-25 ENCOUNTER — Telehealth: Payer: 59 | Admitting: Internal Medicine

## 2020-11-28 NOTE — Progress Notes (Signed)
Office Visit Note  Patient: [name]             Date of Birth: 16-Jan-1971           MRN: [id number]             PCP: Biagio Borg, MD Referring: Biagio Borg, MD Visit Date: 12/03/2020 Occupation: _0 @  Subjective:  Pain in multiple joints.   History of Present Illness: [name] is a 50 y.o. female returns for follow-up visit today.  She states she was seen by Dr. Elsworth Soho who discussed future bronchoscopy.  He also started her on prednisone 10 mg p.o. daily.  Her first dose was today.  He wants to postpone bronchoscopy until she has abdominal hernia surgery.  He is also scheduled a repeat CT scan.  She continues to have pain and discomfort in her wrist joint and her right knee.  She also has some discomfort in her left foot.  She has not seen any visible swelling.  Activities of Daily Living:  Patient reports morning stiffness for 30 minutes.   Patient Reports nocturnal pain.  Difficulty dressing/grooming: Denies Difficulty climbing stairs: Reports Difficulty getting out of chair: Reports Difficulty using hands for taps, buttons, cutlery, and/or writing: Reports  Review of Systems  Constitutional:  Positive for fatigue.  HENT:  Positive for mouth dryness and nose dryness. Negative for mouth sores.   Eyes:  Positive for photophobia, pain, itching and dryness.  Respiratory:  Positive for shortness of breath and difficulty breathing.   Cardiovascular:  Negative for chest pain and palpitations.  Gastrointestinal:  Positive for constipation and diarrhea. Negative for blood in stool.  Endocrine: Negative for increased urination.  Genitourinary:  Negative for difficulty urinating.  Musculoskeletal:  Positive for joint pain, joint pain, myalgias, morning stiffness, muscle tenderness and myalgias. Negative for joint swelling.  Skin:  Positive for color change and rash.  Allergic/Immunologic: Negative for susceptible to infections.  Neurological:  Positive for  dizziness, numbness, headaches and weakness. Negative for memory loss.  Hematological:  Positive for bruising/bleeding tendency.  Psychiatric/Behavioral:  Negative for confusion.    PMFS History:  Patient Active Problem List   Diagnosis Date Noted   Pulmonary nodules 10/20/2020   Multiple skin nodules 07/24/2020   Viral illness 06/14/2020   Right lumbar radiculopathy 70/62/3762   Periumbilical hernia 83/15/1761   TB lung, latent 05/18/2020   Acute non-recurrent maxillary sinusitis 11/09/2019   HTN (hypertension) 04/14/2019   Mass of left wrist 04/14/2019   External otitis of right ear 04/14/2019   Choledocholithiasis    Abnormal cholangiogram    Cholecystitis with cholelithiasis 07/24/2018   Hyperglycemia 10/16/2017   Hot flash not due to menopause 10/16/2017   Eczema 10/16/2017   Flu-like symptoms 06/20/2017   Cognitive changes 01/30/2017   Disturbed concentration 01/26/2017   Left shoulder pain 11/01/2016   Right low back pain 11/01/2016   Elevated blood pressure, situational 05/05/2016   Surgical menopause 01/29/2016   OSA on CPAP 12/22/2015   Cough 11/26/2015   Capsulitis 10/06/2015   Ganglion cyst of left foot 10/06/2015   Fatigue 09/13/2015   Asthma 09/08/2015   Exertional dyspnea 09/08/2015   Peroneal ganglion cyst 07/20/2015   Low back pain 07/20/2015   Peroneal tendinitis of left lower leg 06/25/2015   Nonallopathic lesion of cervical region 06/25/2015   Wheezing 05/08/2015   Polyarthralgia 05/08/2015   Peripheral edema 03/16/2015   Central line complication 60/73/7106   Morbid obesity with BMI of 45.0-49.9,  adult ([place]) 02/28/2015   Chemotherapy induced neutropenia ([place]) 02/28/2015   Genetic testing 01/05/2015   Chemotherapy induced nausea and vomiting 01/02/2015   Chemotherapy-induced peripheral neuropathy ([place]) 01/02/2015   Premature surgical menopause 12/17/2014   Leukopenia due to antineoplastic chemotherapy ([place]) 12/17/2014   Port-A-Cath in place  12/17/2014   Encounter for antineoplastic chemotherapy 12/17/2014   Myalgia 12/09/2014   Hypersensitivity reaction 12/05/2014   Poor venous access 11/28/2014   Postoperative cellulitis of surgical wound 11/24/2014   Tobacco dependence 11/12/2014   Malignant neoplasm of both ovaries 11/10/2014   Anxiety 11/10/2014   Adnexal mass 11/10/2014   Encounter for well adult exam with abnormal findings 09/10/2014   Hypothyroidism 09/10/2014   Hypersomnolence 09/10/2014   Acute non-recurrent frontal sinusitis 06/24/2014   Right knee pain 06/24/2014   Lower back pain 01/08/2014   EAR PAIN, BILATERAL 12/18/2009   KNEE PAIN, RIGHT 12/18/2009   INGROWN TOENAIL 12/17/2008   BACK PAIN 12/17/2008   Acute bronchospasm 11/11/2008   Depression 11/29/2007   Insomnia 11/29/2007   Allergic rhinitis 04/13/2007   GERD 04/13/2007   Endometriosis determined by laparoscopy 04/13/2007   Migraine headache 01/29/2007    Past Medical History:  Diagnosis Date   Anxiety    Elevated blood pressure, situational 05/05/2016   Endometriosis    Exercise-induced asthma 09/08/2015   Ovarian cancer, bilateral 11/28/2014   PONV (postoperative nausea and vomiting)    Thyroid disease    Umbilical hernia     Family History  Problem Relation Age of Onset   Hypertension Mother    Diabetes Mother    Stroke Mother        Deceased   Heart disease Mother    Osteoarthritis Mother    Atrial fibrillation Father        Living   Stroke Father        optic nerve left eye   Healthy Brother    Diabetes Maternal Aunt    Heart failure Maternal Grandmother    Heart attack Maternal Grandfather    Dementia Paternal Grandmother    Dementia Paternal Grandfather    Past Surgical History:  Procedure Laterality Date   ABDOMINAL HYSTERECTOMY  11/10/2014   at [facility], Exp lap, supracervical hyst, BSO, infracolic omentectomy, lymphadenectomy, aortic lymph node sampling   CHOLECYSTECTOMY N/A 07/24/2018   Procedure: LAPAROSCOPIC  CHOLECYSTECTOMY WITH INTRAOPERATIVE CHOLANGIOGRAM;  Surgeon: Armandina Gemma, MD;  Location: [facility];  Service: General;  Laterality: N/A;   ERCP N/A 07/26/2018   Procedure: ENDOSCOPIC RETROGRADE CHOLANGIOPANCREATOGRAPHY (ERCP);  Surgeon: Ladene Artist, MD;  Location: [facility];  Service: Endoscopy;  Laterality: N/A;   GANGLION CYST EXCISION Right    hand   IR GENERIC HISTORICAL  05/12/2016   IR REMOVAL TUN ACCESS W/ PORT W/O FL MOD SED 05/12/2016 [facility]   LIPOMA EXCISION Left    ankle   MENISCUS REPAIR Right    REMOVAL OF STONES  07/26/2018   Procedure: REMOVAL OF STONES;  Surgeon: Ladene Artist, MD;  Location: [facility];  Service: Endoscopy;;   SPHINCTEROTOMY  07/26/2018   Procedure: Joan Mayans;  Surgeon: Ladene Artist, MD;  Location: [facility];  Service: Endoscopy;;   Social History   Social History Narrative   Lives with boyfriend in a one story home.  Has no children.     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     Education: college.   Right handed    Immunization History  Administered Date(s) Administered   Influenza,inj,Quad PF,6+ Mos 01/02/2014,  03/26/2015, 12/25/2017, 01/21/2020   Influenza-Unspecified 02/23/2016, 02/09/2017, 02/12/2020   MMR 02/09/2017, 07/03/2017   PFIZER(Purple Top)SARS-COV-2 Vaccination 08/01/2019, 08/22/2019, 02/22/2020   Tdap 11/07/2017   Tetanus 12/21/2015     Objective: Vital Signs: BP 138/84 (BP Location: Left Wrist, Patient Position: Sitting, Cuff Size: Normal)   Pulse 82   Ht _0  (1.702 m)   Wt (!) 310 lb (140.6 kg)   LMP 11/02/2014   BMI 48.55 kg/m    Physical Exam Vitals and nursing note reviewed.  Constitutional:      Appearance: She is well-developed.  HENT:     Head: Normocephalic and atraumatic.     Comments: Right parotid swelling was noted. Eyes:     Conjunctiva/sclera: Conjunctivae normal.  Cardiovascular:     Rate and Rhythm: Normal rate and regular rhythm.     Heart sounds: Normal heart sounds.  Pulmonary:      Effort: Pulmonary effort is normal.     Breath sounds: Normal breath sounds.  Abdominal:     General: Bowel sounds are normal.     Palpations: Abdomen is soft.  Musculoskeletal:     Cervical back: Normal range of motion.  Lymphadenopathy:     Cervical: No cervical adenopathy.  Skin:    General: Skin is warm and dry.     Capillary Refill: Capillary refill takes less than 2 seconds.  Neurological:     Mental Status: She is alert and oriented to person, place, and time.  Psychiatric:        Behavior: Behavior normal.     Musculoskeletal Exam:C-spine was in good range of motion.  Shoulder joints, elbow joints, wrist joints with good range of motion.  She has some tenderness over right wrist joint.  There is no tenderness over MCPs PIPs or DIPs.  Hip joints and knee joints with good range of motion.  She is some warmth on palpation of her right knee joint.  She had tenderness over the lateral aspect of her left foot but no synovitis was noted.  CDAI Exam: CDAI Score: -- Patient Global: --; Provider Global: -- Swollen: --; Tender: -- Joint Exam 12/03/2020   No joint exam has been documented for this visit   There is currently no information documented on the homunculus. Go to the Rheumatology activity and complete the homunculus joint exam.  Investigation: No additional findings.  Imaging: No results found.  Recent Labs: Lab Results  Component Value Date   WBC 9.4 06/15/2020   HGB 14.3 06/15/2020   PLT 308.0 06/15/2020   NA 140 11/11/2020   K 4.0 11/11/2020   CL 105 11/11/2020   CO2 26 11/11/2020   GLUCOSE 96 11/11/2020   BUN 10 11/11/2020   CREATININE 0.74 11/11/2020   BILITOT 0.9 11/11/2020   ALKPHOS 56 06/15/2020   AST 15 11/11/2020   ALT 15 11/11/2020   PROT 7.0 11/11/2020   PROT 7.0 11/11/2020   ALBUMIN 4.3 06/15/2020   CALCIUM 9.2 11/11/2020   GFRAA 110 11/11/2020   QFTBGOLDPLUS NEGATIVE 11/11/2020   November 11, 2020 IFE negative, immunoglobulins normal,  CK 52, sed rate 6, TB Gold negative November 18, 2020 AVISE lupus index -1.1, ANA 1: 160NS, ENA negative, CB CAP negative, Jo 1 negative, anticardiolipin negative, beta-2 GP 1 negative, antiphosphatidylserine IgM low positive, antihistone negative, RF negative, anti-CCP negative, anti- CarPnegative, antithyroglobulin positive, anti-TPO positive  Sep 10, 2020 CA125 negative, ANA 1: 1280 cytoplasmic, ACE16  Speciality Comments: No specialty comments available.  Procedures:  No procedures  performed Allergies: Moxifloxacin, Quinolones, Doxycycline, Escitalopram oxalate, Lisinopril, and Sulfonamide derivatives   Assessment / Plan:     Visit Diagnoses: Multiple lung nodules - Progression of mediastinal and hilar lymph nodes on the CT scan.  Questionable history of EN lesions and parotid swelling raises concern of sarcoidosis.ACE-N.  Patient was seen by Dr. Elsworth Soho recently.  Dr. Durward Fortes to schedule another CT scan of her chest.  We discussed possible bronchoscopy for lymph node biopsy.  Patient is hesitant about the procedure.  I detailed discussion with the patient today.  If Dr. Elsworth Soho gives her the diagnosis of sarcoidosis based on the CT scan then we can proceed with treatment for sarcoidosis.  My plan is to start her on methotrexate.  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  ENT evaluation pending.  Positive ANA (antinuclear antibody) - Positive ANA, positive sicca symptoms and oral ulcers. AVISE lupus index -1.1, ANA 1: 160NS, antiphosphatidylserine IgM positive.  I will repeat and to phosphatidylserine after 3 months.  Left findings were discussed with the patient and her husband at length.  Primary osteoarthritis of both knees-she continues to have discomfort in her knee joints.  Some warmth was noted in her right knee joint.-Chronic pain.  Right lumbar radiculopathy  Malignant neoplasm of both ovaries - 11/09/2014 TAHBSO, CTX X 6 months.  Patient states that she was released  by her oncologist.  I advised to discuss CT chest findings with Dr.Rossi.  She may also get clearance to start her on methotrexate from Dr. Denman George.  Chemotherapy-induced peripheral neuropathy (HCC)  Primary hypertension-blood pressure is normal today.  History of gastroesophageal reflux (GERD)  Mild intermittent asthma with acute exacerbation  OSA on CPAP  Migraine without aura and with status migrainosus, not intractable  Acquired hypothyroidism  Orders: No orders of the defined types were placed in this encounter.  No orders of the defined types were placed in this encounter.   Face-to-face time spent with patient was 38 minutes.  50% time was spent in counseling and coordination of care.  Follow-Up Instructions: Return in about 2 months (around 02/03/2021) for Joint pain and lymphadenopathy.   Bo Merino, MD  Note - This record has been created using Editor, commissioning.  Chart creation errors have been sought, but may not always  have been located. Such creation errors do not reflect on  the standard of medical care.

## 2020-11-30 ENCOUNTER — Telehealth: Payer: Self-pay | Admitting: Pulmonary Disease

## 2020-11-30 ENCOUNTER — Other Ambulatory Visit: Payer: Self-pay | Admitting: Internal Medicine

## 2020-11-30 ENCOUNTER — Other Ambulatory Visit (HOSPITAL_COMMUNITY): Payer: Self-pay

## 2020-11-30 ENCOUNTER — Other Ambulatory Visit: Payer: Self-pay | Admitting: Adult Health

## 2020-11-30 DIAGNOSIS — F419 Anxiety disorder, unspecified: Secondary | ICD-10-CM

## 2020-11-30 NOTE — Telephone Encounter (Signed)
  Discussed biopsy option with patient. Discussed risks and benefits of EBUS for mediastinal lymphadenopathy with transbronchial biopsy and yields for sarcoidosis She is having hernia surgery in August and hesitant to have a second procedure.  We discussed empiric trial of prednisone and side effects  We decided -Hold off on biopsy -Reschedule PFTs -Sent prescription for prednisone 5 mg tablets -take 10 mg daily for 4 weeks. She will call back to report in 2 weeks  -Follow-up office visit end August

## 2020-12-01 ENCOUNTER — Other Ambulatory Visit (HOSPITAL_COMMUNITY): Payer: Self-pay

## 2020-12-01 MED ORDER — ALPRAZOLAM 1 MG PO TABS
1.0000 mg | ORAL_TABLET | Freq: Two times a day (BID) | ORAL | 2 refills | Status: DC | PRN
Start: 2020-12-01 — End: 2021-08-31
  Filled 2020-12-01: qty 60, 30d supply, fill #0
  Filled 2021-02-03: qty 60, 30d supply, fill #1
  Filled 2021-04-22: qty 60, 30d supply, fill #2

## 2020-12-01 MED ORDER — PREDNISONE 5 MG PO TABS
10.0000 mg | ORAL_TABLET | Freq: Every day | ORAL | 0 refills | Status: DC
  Filled 2020-12-01: qty 60, 30d supply, fill #0

## 2020-12-01 NOTE — Telephone Encounter (Signed)
In that case, I will hold off methotrexate and see response to the prednisone.  Thank you.

## 2020-12-01 NOTE — Telephone Encounter (Signed)
I called and spoke with the pt  I scheduled her for PFT 12/07/20- fully vaxxed and is aware to bring her card to appt  I sent pred to preferred pharm   Dr Elsworth Soho- you do not have any openings in August Next available would be 01/25/21  Other option to use blocked spot on 8/23 or 8/24  Please advise what you prefer and I will call her to schedule, thanks

## 2020-12-01 NOTE — Telephone Encounter (Signed)
Sara Noel, MD  Rosana Berger, CMA Caller: Unspecified (Today,  9:11 AM) Faythe Ghee to schedule 9/19   Spoke with the pt and scheduled appt for 01/25/21 at 2:45 in [place] advised arrive at 2:30. Nothing further needed.

## 2020-12-02 ENCOUNTER — Other Ambulatory Visit (HOSPITAL_COMMUNITY): Payer: Self-pay

## 2020-12-02 ENCOUNTER — Encounter: Payer: Self-pay | Admitting: Internal Medicine

## 2020-12-02 ENCOUNTER — Other Ambulatory Visit: Payer: Self-pay | Admitting: Adult Health

## 2020-12-03 ENCOUNTER — Other Ambulatory Visit (HOSPITAL_COMMUNITY): Payer: Self-pay

## 2020-12-03 ENCOUNTER — Other Ambulatory Visit: Payer: Self-pay | Admitting: Adult Health

## 2020-12-03 ENCOUNTER — Telehealth: Payer: Self-pay

## 2020-12-03 ENCOUNTER — Other Ambulatory Visit: Payer: Self-pay | Admitting: Internal Medicine

## 2020-12-03 ENCOUNTER — Other Ambulatory Visit: Payer: Self-pay

## 2020-12-03 ENCOUNTER — Encounter: Payer: Self-pay | Admitting: Rheumatology

## 2020-12-03 ENCOUNTER — Ambulatory Visit (INDEPENDENT_AMBULATORY_CARE_PROVIDER_SITE_OTHER): Payer: 59 | Admitting: Rheumatology

## 2020-12-03 VITALS — BP 138/84 | HR 82 | Ht 67.0 in | Wt 310.0 lb

## 2020-12-03 DIAGNOSIS — Z9989 Dependence on other enabling machines and devices: Secondary | ICD-10-CM

## 2020-12-03 DIAGNOSIS — M170 Bilateral primary osteoarthritis of knee: Secondary | ICD-10-CM

## 2020-12-03 DIAGNOSIS — M5416 Radiculopathy, lumbar region: Secondary | ICD-10-CM | POA: Diagnosis not present

## 2020-12-03 DIAGNOSIS — G620 Drug-induced polyneuropathy: Secondary | ICD-10-CM

## 2020-12-03 DIAGNOSIS — I10 Essential (primary) hypertension: Secondary | ICD-10-CM

## 2020-12-03 DIAGNOSIS — E039 Hypothyroidism, unspecified: Secondary | ICD-10-CM

## 2020-12-03 DIAGNOSIS — R768 Other specified abnormal immunological findings in serum: Secondary | ICD-10-CM | POA: Diagnosis not present

## 2020-12-03 DIAGNOSIS — R7689 Other specified abnormal immunological findings in serum: Secondary | ICD-10-CM

## 2020-12-03 DIAGNOSIS — Z8719 Personal history of other diseases of the digestive system: Secondary | ICD-10-CM | POA: Diagnosis not present

## 2020-12-03 DIAGNOSIS — R600 Localized edema: Secondary | ICD-10-CM | POA: Diagnosis not present

## 2020-12-03 DIAGNOSIS — C563 Malignant neoplasm of bilateral ovaries: Secondary | ICD-10-CM

## 2020-12-03 DIAGNOSIS — T451X5A Adverse effect of antineoplastic and immunosuppressive drugs, initial encounter: Secondary | ICD-10-CM

## 2020-12-03 DIAGNOSIS — G43001 Migraine without aura, not intractable, with status migrainosus: Secondary | ICD-10-CM

## 2020-12-03 DIAGNOSIS — J4521 Mild intermittent asthma with (acute) exacerbation: Secondary | ICD-10-CM

## 2020-12-03 DIAGNOSIS — R918 Other nonspecific abnormal finding of lung field: Secondary | ICD-10-CM

## 2020-12-03 DIAGNOSIS — G4733 Obstructive sleep apnea (adult) (pediatric): Secondary | ICD-10-CM

## 2020-12-03 MED ORDER — LOSARTAN POTASSIUM 25 MG PO TABS
25.0000 mg | ORAL_TABLET | Freq: Every day | ORAL | 1 refills | Status: DC
  Filled 2020-12-03: qty 47, 47d supply, fill #0
  Filled 2020-12-03: qty 43, 43d supply, fill #0
  Filled 2021-03-13: qty 90, 90d supply, fill #0

## 2020-12-03 NOTE — Telephone Encounter (Signed)
Received phone call from [place] this afternoon. Patient states Dr. Estanislado Pandy is requesting clearance from Dr. Denman George in order to proceed with autoimmune treatment. Patient states she wants to make sure there is no evidence of caner based on CT scan from 09/14/20. CT scan showed mediastinal and hilar lymph node prominence and new pulmonary nodules.

## 2020-12-03 NOTE — Patient Instructions (Signed)
   Please contact Dr. Denman George and discuss CT chest findings.  Please make sure that this is not a metastatic disease and discuss that we will be starting you on immunosuppressive therapy for possible sarcoidosis in the future.

## 2020-12-04 ENCOUNTER — Telehealth: Payer: Self-pay

## 2020-12-04 DIAGNOSIS — R590 Localized enlarged lymph nodes: Secondary | ICD-10-CM

## 2020-12-04 DIAGNOSIS — R918 Other nonspecific abnormal finding of lung field: Secondary | ICD-10-CM

## 2020-12-04 NOTE — Telephone Encounter (Signed)
I called Dr. Serita Grit office, office will discuss clearance for patient to start MTX and review CT chest.

## 2020-12-04 NOTE — Telephone Encounter (Signed)
Phone call to Dr. Luanna Salk office requesting clarification on clearance the patient inquired about on 7/28. Dr. Luanna Salk office will return call to Dr. Serita Grit office.

## 2020-12-04 NOTE — Telephone Encounter (Signed)
Routing to procedure pool. Please advise.

## 2020-12-04 NOTE — Progress Notes (Signed)
Sent message, via epic in basket, requesting orders in epic from surgeon.  

## 2020-12-04 NOTE — Telephone Encounter (Signed)
Dr. Elsworth Soho, please see mychart message sent by pt and advise   Hi Dr. Elsworth Soho,   I hope you are doing well. I wanted to reach back out to you after following up with Dr. Estanislado Pandy yesterday. After having the night to think about it, I was wondering if you still had time available to do the procedure next week? I was nervous to do that just before my other surgery on August 19th but I believe I'd rather know for peace of mind. She mentioned it several times during our visit yesterday, that it would be good to have the sample from the lymph nodes.   Please let me know your thoughts on this.   Thank you, [name]

## 2020-12-04 NOTE — Telephone Encounter (Signed)
Natale Milch from Dr. Serita Grit office called stating they received a call from Levada Dy stating she had an appointment with Dr. Estanislado Pandy yesterday, 12/03/20.  Patient stated Dr. Estanislado Pandy needs clearance from Dr. Denman George so she can start a new medication.  Natale Milch requested a return call to let her know what information Dr. Estanislado Pandy needs.  Please call back at [phone]

## 2020-12-07 ENCOUNTER — Other Ambulatory Visit: Payer: Self-pay

## 2020-12-07 ENCOUNTER — Ambulatory Visit (INDEPENDENT_AMBULATORY_CARE_PROVIDER_SITE_OTHER): Payer: 59 | Admitting: Pulmonary Disease

## 2020-12-07 ENCOUNTER — Ambulatory Visit: Payer: Self-pay | Admitting: General Surgery

## 2020-12-07 DIAGNOSIS — Z9989 Dependence on other enabling machines and devices: Secondary | ICD-10-CM

## 2020-12-07 DIAGNOSIS — R0609 Other forms of dyspnea: Secondary | ICD-10-CM

## 2020-12-07 DIAGNOSIS — G4733 Obstructive sleep apnea (adult) (pediatric): Secondary | ICD-10-CM | POA: Diagnosis not present

## 2020-12-07 DIAGNOSIS — R0600 Dyspnea, unspecified: Secondary | ICD-10-CM

## 2020-12-07 DIAGNOSIS — R918 Other nonspecific abnormal finding of lung field: Secondary | ICD-10-CM

## 2020-12-07 LAB — PULMONARY FUNCTION TEST
DL/VA % pred: 129 %
DL/VA: 5.48 ml/min/mmHg/L
DLCO cor % pred: 108 %
DLCO cor: 25.3 ml/min/mmHg
DLCO unc % pred: 108 %
DLCO unc: 25.3 ml/min/mmHg
FEF 25-75 Post: 4.41 L/sec
FEF 25-75 Pre: 3.68 L/sec
FEF2575-%Change-Post: 19 %
FEF2575-%Pred-Post: 148 %
FEF2575-%Pred-Pre: 123 %
FEV1-%Change-Post: 9 %
FEV1-%Pred-Post: 93 %
FEV1-%Pred-Pre: 85 %
FEV1-Post: 2.92 L
FEV1-Pre: 2.68 L
FEV1FVC-%Change-Post: -1 %
FEV1FVC-%Pred-Pre: 110 %
FEV6-%Change-Post: 10 %
FEV6-%Pred-Post: 86 %
FEV6-%Pred-Pre: 78 %
FEV6-Post: 3.32 L
FEV6-Pre: 3.01 L
FEV6FVC-%Pred-Post: 102 %
FEV6FVC-%Pred-Pre: 102 %
FVC-%Change-Post: 10 %
FVC-%Pred-Post: 84 %
FVC-%Pred-Pre: 76 %
FVC-Post: 3.32 L
FVC-Pre: 3.01 L
Post FEV1/FVC ratio: 88 %
Post FEV6/FVC ratio: 100 %
Pre FEV1/FVC ratio: 89 %
Pre FEV6/FVC Ratio: 100 %
RV % pred: 92 %
RV: 1.78 L
TLC % pred: 91 %
TLC: 5.03 L

## 2020-12-07 NOTE — Telephone Encounter (Signed)
Dr. Elsworth Soho once you fill out the dot phrase (.bronchorderdetails) I can call central scheduling

## 2020-12-07 NOTE — Progress Notes (Signed)
PFT done today. 

## 2020-12-08 NOTE — Telephone Encounter (Signed)
Please schedule the following:  Provider performing procedure: Diagnosis:  Which side if for nodule / mass?  Procedure:   Has patient been spoken to by Provider and given informed consent?  Anesthesia:  Do you need Fluro?  Duration of procedure:  Date:  Alternate Date:   Time: AM/ PM Location:  Does patient have OSA?  DM?  Or Latex allergy?  Medication Restriction:  Anticoagulate/Antiplatelet:  Pre-op Labs Ordered:determined by Anesthesia Imaging request:   (If, SuperDimension CT Chest, please have STAT courier sent to ENDO)  Please coordinate Pre-op COVID Testing    Please answer the following questions Dr. Elsworth Soho

## 2020-12-09 ENCOUNTER — Other Ambulatory Visit (HOSPITAL_COMMUNITY): Payer: Self-pay

## 2020-12-09 ENCOUNTER — Encounter: Payer: Self-pay | Admitting: Oncology

## 2020-12-09 MED ORDER — OMRON 3 SERIES BP MONITOR DEVI
0 refills | Status: DC
  Filled 2020-12-09: qty 1, 1d supply, fill #0

## 2020-12-09 NOTE — Telephone Encounter (Signed)
Letter from Dr. Denman George to Dr. Luanna Salk office faxed successfully, confirmation received.

## 2020-12-09 NOTE — Progress Notes (Signed)
DUE TO COVID-19 ONLY ONE VISITOR IS ALLOWED TO COME WITH YOU AND STAY IN THE WAITING ROOM ONLY DURING PRE OP AND PROCEDURE DAY OF SURGERY.  2 VISITOR  MAY VISIT WITH YOU AFTER SURGERY IN YOUR PRIVATE ROOM DURING VISITING HOURS ONLY!  YOU NEED TO HAVE A COVID 19 TEST ON__8/16/22 ____'@_'$  '@_from'$  8am-3pm _____, THIS TEST MUST BE DONE BEFORE SURGERY,  Covid test is done at [place], [place] [address].  This is a drive thru.  No appt required. Please see map.                 Your procedure is scheduled on:  12/25/2020   Report to [facility] Main  Entrance   Report to admitting at    0900 AM     Call this number if you have problems the morning of surgery [phone]    REMEMBER: NO  SOLID FOOD CANDY OR GUM AFTER MIDNIGHT. CLEAR LIQUIDS UNTIL   0800am         . NOTHING BY MOUTH EXCEPT CLEAR LIQUIDS UNTIL     0800am   . PLEASE FINISH ENSURE DRINK PER SURGEON ORDER  WHICH NEEDS TO BE COMPLETED AT  0800am     .      CLEAR LIQUID DIET   Foods Allowed                                                                    Coffee and tea, regular and decaf                            Fruit ices (not with fruit pulp)                                      Iced Popsicles                                    Carbonated beverages, regular and diet                                    Cranberry, grape and apple juices Sports drinks like Gatorade Lightly seasoned clear broth or consume(fat free) Sugar, honey syrup ___________________________________________________________________      BRUSH YOUR TEETH MORNING OF SURGERY AND RINSE YOUR MOUTH OUT, NO CHEWING GUM CANDY OR MINTS.     Take these medicines the morning of surgery with A SIP OF WATER:     Flonase, inhalers as usual and bring, gabapentin, synthroid  DO NOT TAKE ANY DIABETIC MEDICATIONS DAY OF YOUR SURGERY                               You may not have any metal on your body including hair pins and               piercings  Do not wear jewelry, make-up, lotions, powders or perfumes, deodorant  Do not wear nail polish on your fingernails.  Do not shave  48 hours prior to surgery.              Men may shave face and neck.   Do not bring valuables to the hospital. [place].  Contacts, dentures or bridgework may not be worn into surgery.  Leave suitcase in the car. After surgery it may be brought to your room.     Patients discharged the day of surgery will not be allowed to drive home. IF YOU ARE HAVING SURGERY AND GOING HOME THE SAME DAY, YOU MUST HAVE AN ADULT TO DRIVE YOU HOME AND BE WITH YOU FOR 24 HOURS. YOU MAY GO HOME BY TAXI OR UBER OR ORTHERWISE, BUT AN ADULT MUST ACCOMPANY YOU HOME AND STAY WITH YOU FOR 24 HOURS.  Name and phone number of your driver:  Special Instructions: N/A              Please read over the following fact sheets you were given: _____________________________________________________________________  [facility] - Preparing for Surgery Before surgery, you can play an important role.  Because skin is not sterile, your skin needs to be as free of germs as possible.  You can reduce the number of germs on your skin by washing with CHG (chlorahexidine gluconate) soap before surgery.  CHG is an antiseptic cleaner which kills germs and bonds with the skin to continue killing germs even after washing. Please DO NOT use if you have an allergy to CHG or antibacterial soaps.  If your skin becomes reddened/irritated stop using the CHG and inform your nurse when you arrive at Short Stay. Do not shave (including legs and underarms) for at least 48 hours prior to the first CHG shower.  You may shave your face/neck. Please follow these instructions carefully:  1.  Shower with CHG Soap the night before surgery and the  morning of Surgery.  2.  If you choose to wash your hair, wash your hair first as usual with your  normal   shampoo.  3.  After you shampoo, rinse your hair and body thoroughly to remove the  shampoo.                           4.  Use CHG as you would any other liquid soap.  You can apply chg directly  to the skin and wash                       Gently with a scrungie or clean washcloth.  5.  Apply the CHG Soap to your body ONLY FROM THE NECK DOWN.   Do not use on face/ open                           Wound or open sores. Avoid contact with eyes, ears mouth and genitals (private parts).                       Wash face,  Genitals (private parts) with your normal soap.             6.  Wash thoroughly, paying special attention to the area where your surgery  will be performed.  7.  Thoroughly rinse your body with  warm water from the neck down.  8.  DO NOT shower/wash with your normal soap after using and rinsing off  the CHG Soap.                9.  Pat yourself dry with a clean towel.            10.  Wear clean pajamas.            11.  Place clean sheets on your bed the night of your first shower and do not  sleep with pets. Day of Surgery : Do not apply any lotions/deodorants the morning of surgery.  Please wear clean clothes to the hospital/surgery center.  FAILURE TO FOLLOW THESE INSTRUCTIONS MAY RESULT IN THE CANCELLATION OF YOUR SURGERY PATIENT SIGNATURE_________________________________  NURSE SIGNATURE__________________________________  ________________________________________________________________________

## 2020-12-10 ENCOUNTER — Telehealth: Payer: Self-pay | Admitting: Pulmonary Disease

## 2020-12-10 NOTE — Telephone Encounter (Signed)
Please schedule the following:  Provider performing procedure:Riko Lumsden Diagnosis: mediastinal lymphadenopathy Which side if for nodule / mass? NA Procedure: EBUS with brochoscopy  Has patient been spoken to by Provider and given informed consent? yes Anesthesia: general Do you need Fluro? No  Date: Aug 9 Alternate Date: aug 8 or 10  Time: 9 AM Location: [facility] Does patient have OSA? ? DM? no Or Latex allergy? no Medication Restriction: none Anticoagulate/Antiplatelet: none Pre-op Labs Ordered:determined by Anesthesia Imaging request: NA  (If, SuperDimension CT Chest, please have STAT courier sent to ENDO)  Please coordinate Pre-op COVID Testing

## 2020-12-10 NOTE — Telephone Encounter (Signed)
Pt informed of the following:  Covid test 8/8 from 8am to 3pm.  Bronch 8/10 at 9:00am; 6:30am arrival time @ [facility].

## 2020-12-11 ENCOUNTER — Encounter (HOSPITAL_COMMUNITY)
Admission: RE | Admit: 2020-12-11 | Discharge: 2020-12-11 | Disposition: A | Discharge status: Home / Self Care | Payer: 59 | Source: Ambulatory Visit | Attending: General Surgery | Admitting: General Surgery

## 2020-12-11 ENCOUNTER — Other Ambulatory Visit: Payer: Self-pay

## 2020-12-11 ENCOUNTER — Encounter (HOSPITAL_COMMUNITY): Payer: Self-pay

## 2020-12-11 DIAGNOSIS — Z01818 Encounter for other preprocedural examination: Secondary | ICD-10-CM | POA: Diagnosis not present

## 2020-12-11 HISTORY — DX: Personal history of urinary calculi: Z87.442

## 2020-12-11 HISTORY — DX: Dyspnea, unspecified: R06.00

## 2020-12-11 HISTORY — DX: Sleep apnea, unspecified: G47.30

## 2020-12-11 HISTORY — DX: Unspecified osteoarthritis, unspecified site: M19.90

## 2020-12-11 HISTORY — DX: Depression, unspecified: F32.A

## 2020-12-11 LAB — CBC
HCT: 42.6 % (ref 36.0–46.0)
Hemoglobin: 14.4 g/dL (ref 12.0–15.0)
MCH: 30.6 pg (ref 26.0–34.0)
MCHC: 33.8 g/dL (ref 30.0–36.0)
MCV: 90.6 fL (ref 80.0–100.0)
Platelets: 282 10*3/uL (ref 150–400)
RBC: 4.7 MIL/uL (ref 3.87–5.11)
RDW: 12.8 % (ref 11.5–15.5)
WBC: 15.2 10*3/uL — ABNORMAL HIGH (ref 4.0–10.5)
nRBC: 0 % (ref 0.0–0.2)

## 2020-12-11 LAB — BASIC METABOLIC PANEL
Anion gap: 10 (ref 5–15)
BUN: 12 mg/dL (ref 6–20)
CO2: 25 mmol/L (ref 22–32)
Calcium: 9.3 mg/dL (ref 8.9–10.3)
Chloride: 103 mmol/L (ref 98–111)
Creatinine, Ser: 0.7 mg/dL (ref 0.44–1.00)
GFR, Estimated: >60 mL/min (ref >60)
Glucose, Bld: 90 mg/dL (ref 70–99)
Potassium: 3.5 mmol/L (ref 3.5–5.1)
Sodium: 138 mmol/L (ref 135–145)

## 2020-12-11 NOTE — Progress Notes (Addendum)
Anesthesia Review:  PCP: DR [name]  Cardiologist : none  Pulmonology- DR Elsworth Soho- [facility] 10/13/20.  Rheumatology- Dr Estanislado Pandy-  [place] 12/03/20 Chest x-ray : CT chest- 5/10'/22 EKG :12/11/20  Echo : Stress test: Cardiac Cath :  Activity level: can do a flight of stairs without diffculty  Sleep Study/ CPAP : yes  Fasting Blood Sugar :      / Checks Blood Sugar -- times a day:   Blood Thinner/ Instructions /Last Dose: ASA / Instructions/ Last Dose :   PT with hx of ovarian cancer being followed by Dr Elsworth Soho and Dr Estanislado Pandy has a scheduled video bronchoscopy on 12/16/2020.  PT states Dr Kieth Brightly not aware.  Instructed pt to notify Dr Kieth Brightly office and gave phone number.  Informed pt that Dr Kieth Brightly needed to be aware of this.  Bronchoscopy is to rule out cancer and sarcoidosis per pt.   Temp- 99.0 at preop .  PT denies any covoid symptoms at preop.  Asked pt about any recent fever .  PT states had fevers  ( up to 101 approx) approx 3 weeks ago covid negative ) employee of [employer]) Discovered parotid gland was swollen per pt and placed on antibiotics.   CBC done 12/11/2020 routed to DR Kinsinger.

## 2020-12-11 NOTE — Progress Notes (Signed)
I called the patient and she voices understanding and did not have any other concerns.

## 2020-12-14 ENCOUNTER — Other Ambulatory Visit: Payer: Self-pay | Admitting: General Surgery

## 2020-12-15 ENCOUNTER — Other Ambulatory Visit: Payer: Self-pay

## 2020-12-15 ENCOUNTER — Encounter (HOSPITAL_COMMUNITY): Payer: Self-pay | Admitting: Pulmonary Disease

## 2020-12-15 ENCOUNTER — Other Ambulatory Visit: Payer: Self-pay | Admitting: Pulmonary Disease

## 2020-12-15 LAB — SARS CORONAVIRUS 2 (TAT 6-24 HRS): SARS Coronavirus 2: NEGATIVE

## 2020-12-15 NOTE — Progress Notes (Signed)
[name] denies chest pain or shortness of breath at this time.  Patient  tested  negative for Covid 12/14/20 and is wearing a mask when she is out and around other people.  Ms [name] has a CPAP, she has not been able to wear it recently, because she is waiting on some equipment from Home Health- Dr. Elsworth Soho is aware.

## 2020-12-15 NOTE — Anesthesia Preprocedure Evaluation (Addendum)
Anesthesia Evaluation  Patient identified by MRN, date of birth, ID band Patient awake    Reviewed: Allergy & Precautions, H&P , NPO status , Patient's Chart, lab work & pertinent test results  History of Anesthesia Complications (+) PONV and PROLONGED EMERGENCE  Airway Mallampati: III  TM Distance: >3 FB Neck ROM: Full    Dental no notable dental hx. (+) Teeth Intact, Dental Advisory Given   Pulmonary asthma , sleep apnea ,    Pulmonary exam normal breath sounds clear to auscultation       Cardiovascular Exercise Tolerance: Good hypertension, Pt. on medications  Rhythm:Regular Rate:Normal     Neuro/Psych  Headaches, Anxiety Depression    GI/Hepatic Neg liver ROS, GERD  Medicated,  Endo/Other  Hypothyroidism Morbid obesity  Renal/GU negative Renal ROS  negative genitourinary   Musculoskeletal  (+) Arthritis , Osteoarthritis,    Abdominal   Peds  Hematology negative hematology ROS (+)   Anesthesia Other Findings   Reproductive/Obstetrics negative OB ROS                            Anesthesia Physical Anesthesia Plan  ASA: 3  Anesthesia Plan: General   Post-op Pain Management:    Induction: Intravenous  PONV Risk Score and Plan: 4 or greater and Ondansetron, Dexamethasone, Midazolam and Aprepitant  Airway Management Planned: Oral ETT  Additional Equipment:   Intra-op Plan:   Post-operative Plan: Extubation in OR  Informed Consent: I have reviewed the patients History and Physical, chart, labs and discussed the procedure including the risks, benefits and alternatives for the proposed anesthesia with the patient or authorized representative who has indicated his/her understanding and acceptance.     Dental advisory given  Plan Discussed with: CRNA  Anesthesia Plan Comments:        Anesthesia Quick Evaluation

## 2020-12-16 ENCOUNTER — Other Ambulatory Visit: Payer: Self-pay

## 2020-12-16 ENCOUNTER — Ambulatory Visit (HOSPITAL_COMMUNITY): Payer: 59 | Admitting: Anesthesiology

## 2020-12-16 ENCOUNTER — Encounter (HOSPITAL_COMMUNITY): Payer: Self-pay | Admitting: Pulmonary Disease

## 2020-12-16 ENCOUNTER — Ambulatory Visit (INDEPENDENT_AMBULATORY_CARE_PROVIDER_SITE_OTHER): Payer: 59 | Admitting: Otolaryngology

## 2020-12-16 ENCOUNTER — Encounter (HOSPITAL_COMMUNITY)
Admission: RE | Disposition: A | Discharge status: Home / Self Care | Payer: Self-pay | Source: Home / Self Care | Attending: Pulmonary Disease

## 2020-12-16 ENCOUNTER — Ambulatory Visit (HOSPITAL_COMMUNITY)
Admission: RE | Admit: 2020-12-16 | Discharge: 2020-12-16 | Disposition: A | Discharge status: Home / Self Care | Payer: 59 | Attending: Pulmonary Disease | Admitting: Pulmonary Disease

## 2020-12-16 DIAGNOSIS — E669 Obesity, unspecified: Secondary | ICD-10-CM | POA: Insufficient documentation

## 2020-12-16 DIAGNOSIS — G4733 Obstructive sleep apnea (adult) (pediatric): Secondary | ICD-10-CM | POA: Diagnosis not present

## 2020-12-16 DIAGNOSIS — Z882 Allergy status to sulfonamides status: Secondary | ICD-10-CM | POA: Insufficient documentation

## 2020-12-16 DIAGNOSIS — I10 Essential (primary) hypertension: Secondary | ICD-10-CM | POA: Diagnosis not present

## 2020-12-16 DIAGNOSIS — R590 Localized enlarged lymph nodes: Secondary | ICD-10-CM | POA: Diagnosis not present

## 2020-12-16 DIAGNOSIS — Z833 Family history of diabetes mellitus: Secondary | ICD-10-CM | POA: Insufficient documentation

## 2020-12-16 DIAGNOSIS — Z6841 Body Mass Index (BMI) 40.0 and over, adult: Secondary | ICD-10-CM | POA: Diagnosis not present

## 2020-12-16 DIAGNOSIS — R918 Other nonspecific abnormal finding of lung field: Secondary | ICD-10-CM | POA: Diagnosis not present

## 2020-12-16 DIAGNOSIS — Z9221 Personal history of antineoplastic chemotherapy: Secondary | ICD-10-CM | POA: Diagnosis not present

## 2020-12-16 DIAGNOSIS — Z820 Family history of epilepsy and other diseases of the nervous system: Secondary | ICD-10-CM | POA: Diagnosis not present

## 2020-12-16 DIAGNOSIS — Z8543 Personal history of malignant neoplasm of ovary: Secondary | ICD-10-CM | POA: Insufficient documentation

## 2020-12-16 DIAGNOSIS — Z888 Allergy status to other drugs, medicaments and biological substances status: Secondary | ICD-10-CM | POA: Insufficient documentation

## 2020-12-16 DIAGNOSIS — Z881 Allergy status to other antibiotic agents status: Secondary | ICD-10-CM | POA: Diagnosis not present

## 2020-12-16 DIAGNOSIS — Z8249 Family history of ischemic heart disease and other diseases of the circulatory system: Secondary | ICD-10-CM | POA: Insufficient documentation

## 2020-12-16 DIAGNOSIS — R059 Cough, unspecified: Secondary | ICD-10-CM | POA: Insufficient documentation

## 2020-12-16 DIAGNOSIS — G629 Polyneuropathy, unspecified: Secondary | ICD-10-CM | POA: Insufficient documentation

## 2020-12-16 DIAGNOSIS — Z823 Family history of stroke: Secondary | ICD-10-CM | POA: Insufficient documentation

## 2020-12-16 DIAGNOSIS — Z9989 Dependence on other enabling machines and devices: Secondary | ICD-10-CM | POA: Diagnosis not present

## 2020-12-16 HISTORY — DX: Hypothyroidism, unspecified: E03.9

## 2020-12-16 HISTORY — PX: BRONCHIAL WASHINGS: SHX5105

## 2020-12-16 HISTORY — DX: Gastro-esophageal reflux disease without esophagitis: K21.9

## 2020-12-16 HISTORY — DX: Adverse effect of unspecified anesthetic, initial encounter: T41.45XA

## 2020-12-16 HISTORY — PX: BRONCHIAL NEEDLE ASPIRATION BIOPSY: SHX5106

## 2020-12-16 HISTORY — DX: Migraine, unspecified, not intractable, without status migrainosus: G43.909

## 2020-12-16 HISTORY — PX: VIDEO BRONCHOSCOPY WITH ENDOBRONCHIAL ULTRASOUND: SHX6177

## 2020-12-16 LAB — BODY FLUID CELL COUNT WITH DIFFERENTIAL
Eos, Fluid: 0 %
Lymphs, Fluid: 67 %
Monocyte-Macrophage-Serous Fluid: 2 % — ABNORMAL LOW (ref 50–90)
Neutrophil Count, Fluid: 31 % — ABNORMAL HIGH (ref 0–25)
Total Nucleated Cell Count, Fluid: 19 cu mm (ref 0–1000)

## 2020-12-16 SURGERY — BRONCHOSCOPY, WITH EBUS
Anesthesia: General

## 2020-12-16 MED ORDER — MIDAZOLAM HCL 2 MG/2ML IJ SOLN
INTRAMUSCULAR | Status: DC | PRN
  Administered 2020-12-16: 2 mg via INTRAVENOUS

## 2020-12-16 MED ORDER — DIPHENHYDRAMINE HCL 50 MG/ML IJ SOLN
INTRAMUSCULAR | Status: DC | PRN
  Administered 2020-12-16: 12.5 mg via INTRAVENOUS

## 2020-12-16 MED ORDER — LACTATED RINGERS IV SOLN
INTRAVENOUS | Status: DC
Start: 2020-12-16 — End: 2020-12-16

## 2020-12-16 MED ORDER — PROPOFOL 10 MG/ML IV BOLUS
INTRAVENOUS | Status: AC
  Filled 2020-12-16: qty 20

## 2020-12-16 MED ORDER — ORAL CARE MOUTH RINSE: 15.0000 mL | Freq: Once | OROMUCOSAL | Status: AC

## 2020-12-16 MED ORDER — FENTANYL CITRATE (PF) 250 MCG/5ML IJ SOLN
INTRAMUSCULAR | Status: DC | PRN
  Administered 2020-12-16: 50 ug via INTRAVENOUS
  Administered 2020-12-16: 100 ug via INTRAVENOUS

## 2020-12-16 MED ORDER — APREPITANT 40 MG PO CAPS: 40.0000 mg | ORAL_CAPSULE | Freq: Once | ORAL | Status: AC

## 2020-12-16 MED ORDER — APREPITANT 40 MG PO CAPS
ORAL_CAPSULE | ORAL | Status: AC
  Administered 2020-12-16: 40 mg via ORAL
  Filled 2020-12-16: qty 1

## 2020-12-16 MED ORDER — ONDANSETRON HCL 4 MG/2ML IJ SOLN
INTRAMUSCULAR | Status: DC | PRN
  Administered 2020-12-16: 4 mg via INTRAVENOUS

## 2020-12-16 MED ORDER — FENTANYL CITRATE (PF) 100 MCG/2ML IJ SOLN
25.0000 ug | INTRAMUSCULAR | Status: DC | PRN
  Administered 2020-12-16: 50 ug via INTRAVENOUS

## 2020-12-16 MED ORDER — SUGAMMADEX SODIUM 200 MG/2ML IV SOLN
INTRAVENOUS | Status: DC | PRN
  Administered 2020-12-16: 280 mg via INTRAVENOUS

## 2020-12-16 MED ORDER — LIDOCAINE 2% (20 MG/ML) 5 ML SYRINGE
INTRAMUSCULAR | Status: DC | PRN
  Administered 2020-12-16: 60 mg via INTRAVENOUS

## 2020-12-16 MED ORDER — MIDAZOLAM HCL 2 MG/2ML IJ SOLN
INTRAMUSCULAR | Status: AC
  Filled 2020-12-16: qty 2

## 2020-12-16 MED ORDER — FENTANYL CITRATE (PF) 100 MCG/2ML IJ SOLN
INTRAMUSCULAR | Status: AC
  Filled 2020-12-16: qty 2

## 2020-12-16 MED ORDER — CHLORHEXIDINE GLUCONATE 0.12 % MT SOLN
OROMUCOSAL | Status: AC
  Administered 2020-12-16: 15 mL via OROMUCOSAL
  Filled 2020-12-16: qty 15

## 2020-12-16 MED ORDER — CHLORHEXIDINE GLUCONATE 0.12 % MT SOLN: 15.0000 mL | Freq: Once | OROMUCOSAL | Status: AC

## 2020-12-16 MED ORDER — DIPHENHYDRAMINE HCL 50 MG/ML IJ SOLN
INTRAMUSCULAR | Status: AC
  Filled 2020-12-16: qty 1

## 2020-12-16 MED ORDER — ACETAMINOPHEN 500 MG PO TABS
ORAL_TABLET | ORAL | Status: AC
  Administered 2020-12-16: 1000 mg via ORAL
  Filled 2020-12-16: qty 2

## 2020-12-16 MED ORDER — ROCURONIUM BROMIDE 10 MG/ML (PF) SYRINGE
PREFILLED_SYRINGE | INTRAVENOUS | Status: DC | PRN
  Administered 2020-12-16: 50 mg via INTRAVENOUS

## 2020-12-16 MED ORDER — ACETAMINOPHEN 500 MG PO TABS: 1000.0000 mg | ORAL_TABLET | Freq: Once | ORAL | Status: AC

## 2020-12-16 MED ORDER — FENTANYL CITRATE (PF) 250 MCG/5ML IJ SOLN
INTRAMUSCULAR | Status: AC
  Filled 2020-12-16: qty 5

## 2020-12-16 MED ORDER — PROPOFOL 10 MG/ML IV BOLUS
INTRAVENOUS | Status: DC | PRN
  Administered 2020-12-16: 50 mg via INTRAVENOUS
  Administered 2020-12-16: 150 mg via INTRAVENOUS

## 2020-12-16 MED ORDER — DEXAMETHASONE SODIUM PHOSPHATE 10 MG/ML IJ SOLN
INTRAMUSCULAR | Status: DC | PRN
  Administered 2020-12-16: 5 mg via INTRAVENOUS

## 2020-12-16 NOTE — Anesthesia Postprocedure Evaluation (Signed)
Anesthesia Post Note  Patient: [name]  Procedure(s) Performed: VIDEO BRONCHOSCOPY WITH ENDOBRONCHIAL ULTRASOUND BRONCHIAL WASHINGS BRONCHIAL NEEDLE ASPIRATION BIOPSIES     Patient location during evaluation: PACU Anesthesia Type: General Level of consciousness: awake and alert Pain management: pain level controlled Vital Signs Assessment: post-procedure vital signs reviewed and stable Respiratory status: spontaneous breathing, nonlabored ventilation and respiratory function stable Cardiovascular status: blood pressure returned to baseline and stable Postop Assessment: no apparent nausea or vomiting Anesthetic complications: no   No notable events documented.  Last Vitals:  Vitals:   12/16/20 1117 12/16/20 1125  BP:  130/74  Pulse: 89 86  Resp: 14 15  Temp:  (!) 36.3 C  SpO2: 94% 94%    Last Pain:  Vitals:   12/16/20 1125  TempSrc:   PainSc: 0-No pain                 Darvell Monteforte,W. EDMOND

## 2020-12-16 NOTE — H&P (Signed)
50 year old female never smoker followed for obstructive sleep apnea.  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  Patient was seen last visit to reestablish for sleep apnea.  She was recommended to restart CPAP.  Unfortunately she has not wearing her CPAP. Needs new supplies . We discussed compliance . Has daytime sleepiness.    Patient complained that in January 2022 got acute illness with cough, congestion and associated tender/painful  nodules along her forearms fingers and legs.  There was concern she may have possible underlying sarcoidosis.  Her primary care provider gave her prednisone and Tessalon. Nodules improved after steroids but still has some residual hyperpigmentation .  Patient was set up for a CT chest completed on Sep 14, 2020 that showed enlarged  mediastinal and hilar lymph nodes.  Along with multiple scattered pulmonary nodules.  Last visit patient was recommended to stop her lisinopril.  PFT is pending.  ACE level was normal.  Her ANA was positive, ANA titer 1: 1280 She was referred to rheumatology.  And recommended to have a follow-up CT chest in 3 months Cough is only slightly better continues to have a dry cough . ACE inhibitor was stopped last ov , blood pressure has been elevated. No headache or visual changes.     Allergies  Allergen Reactions   Moxifloxacin Anaphylaxis    needs epinephrine shot   Quinolones Anaphylaxis, Shortness Of Breath and Swelling    Doxycycline Nausea And Vomiting   Escitalopram Oxalate Other (See Comments)     fatigue   Lisinopril Cough   Sulfonamide Derivatives Swelling and Rash    needs epinephrine shot--rash and lip swelling    .oc  Family History  Problem Relation Age of Onset   Hypertension Mother    Diabetes Mother    Stroke Mother        Deceased   Heart disease Mother    Osteoarthritis Mother    Atrial fibrillation Father        Living   Stroke Father        optic nerve left eye   Healthy Brother    Diabetes Maternal Aunt    Heart failure Maternal Grandmother    Heart attack Maternal Grandfather    Dementia Paternal Grandmother    Dementia Paternal Grandfather       Gen. Pleasant, obese, in no distress, normal affect ENT - no pallor,icterus, no post nasal drip, class 2-3 airway Neck: No JVD, no thyromegaly, no carotid bruits Lungs: no use of accessory muscles, no dullness to percussion, decreased without rales or rhonchi  Cardiovascular: Rhythm regular, heart sounds  normal, no murmurs or gallops, no peripheral edema Abdomen: soft and non-tender, no hepatosplenomegaly, BS normal. Musculoskeletal: No deformities, no cyanosis or clubbing Neuro:  alert, non focal, no tremors   Imppression/ plan   Scattered pulmonary nodules and enlarged mediastinal and hilar lymph nodes questionable etiology. Pulmonary function testing was normal.  ACE level was normal.  ANA was positive.  She has an upcoming appointment with rheumatology.  We will continue to follow pulmonary nodules serially.     [name] has presented today for procedure, with the diagnosis of mediastinal lymphadenopathy. The various methods of treatment have been discussed with the patient and family. After consideration of risks, benefits and other options for treatment, the patient has consented to Procedure(s) :  Bronchoscopy with ultrasound and biopsy .  The patient's history has been reviewed, patient examined, no change  in status, stable for surgery. I have reviewed the patient's chart and labs. Questions were answered to the patient's satisfaction    Kara Mead MD. [facility]. [place] [facility] Pager : [phone]  If no response to pager , please call [phone] until 7 pm After 7:00 pm call Elink  [phone]   12/16/2020

## 2020-12-16 NOTE — Op Note (Signed)
  Name:  [name] MRN:  [id number] DOB:  [birth year]  PROCEDURE NOTE  Procedure(s): Flexible bronchoscopy [phone]) Bronchial alveolar lavage [phone]) of the RLL  Endobronchial ultrasound ([id number]) Transbronchial needle aspiration (25498) of the 4R station Transbronchial needle aspiration, additional lobe (26415) of station 7  Indications:  Hilar / mediastinal lymphadenopathy.  Consent:  Written informed consent was obtained prior to the procedure. The risks of the procedure including coughing, bleeding and the small chance of lung puncture requiring chest tube were discussed in great detail. The benefits & alternatives including serial follow up were also discussed.  Anesthesia:  General endotracheal.  Procedure summary:  Appropriate equipment was assembled.  The patient was  identified as [name]. Interim history obtained and brought to the operating room. Safety timeout was performed. The patient was placed supine on the operating table, airway established and general anesthesia administered by Anesthesia team.   After the appropriate level of anesthesia was assured, flexible video bronchoscope was lubricated and inserted through the endotracheal tube.    Airway examination was performed bilaterally to subsegmental level.  Minimal clear secretions were noted, mucosa appeared normal and no endobronchial lesions were identified.  Endobronchial ultrasound video bronchoscope was then lubricated and inserted through the endotracheal tube. Surveillance of the mediastinal and and bilateral hilar lymph node stations was performed.  Pathologically enlarged lymph nodes were noted at stations 4R & 7.  Endobronchial ultrasound guided transbronchial needle aspiration of station 4R  (passes x4), station 7  (passes x 3) was performed, after which EBUS bronchoscope was withdrawn.  Flexible video bronchoscope was used again to perform random endobronchial mucosal biopsies.  After ensuring  hemostasis , the bronchoscope was withdrawn.  The patient was extubated in operating room and transferred to recovery   Specimens sent: Bronchial alveolar lavage specimen of the RLL for cell count and cytology. TBNA for cytology  Complications:  No immediate complications were noted.  Hemodynamic parameters and oxygenation remained stable throughout the procedure.  Estimated blood loss:  Less then 5 mL.   Kara Mead MD. [facility]. [place] [facility] Pager [phone] If no response call [phone]   12/16/2020 10:20 AM

## 2020-12-16 NOTE — Transfer of Care (Signed)
Immediate Anesthesia Transfer of Care Note  Patient: [name]  Procedure(s) Performed: VIDEO BRONCHOSCOPY WITH ENDOBRONCHIAL ULTRASOUND BRONCHIAL WASHINGS BRONCHIAL NEEDLE ASPIRATION BIOPSIES  Patient Location: PACU  Anesthesia Type:General  Level of Consciousness: awake and alert   Airway & Oxygen Therapy: Patient Spontanous Breathing and Patient connected to face mask oxygen  Post-op Assessment: Report given to RN and Post -op Vital signs reviewed and stable  Post vital signs: Reviewed and stable  Last Vitals:  Vitals Value Taken Time  BP 138/89 12/16/20 1024  Temp    Pulse 94 12/16/20 1028  Resp 13 12/16/20 1028  SpO2 99 % 12/16/20 1028  Vitals shown include unvalidated device data.  Last Pain:  Vitals:   12/16/20 0716  TempSrc: Oral         Complications: No notable events documented.

## 2020-12-16 NOTE — Anesthesia Procedure Notes (Addendum)
Procedure Name: Intubation Date/Time: 12/16/2020 9:12 AM Performed by: Reece Agar, CRNA Pre-anesthesia Checklist: Patient identified, Emergency Drugs available, Suction available and Patient being monitored Patient Re-evaluated:Patient Re-evaluated prior to induction Oxygen Delivery Method: Circle System Utilized Preoxygenation: Pre-oxygenation with 100% oxygen Induction Type: IV induction Ventilation: Mask ventilation without difficulty Laryngoscope Size: Glidescope and 4 Grade View: Grade I Tube type: Oral Tube size: 8.5 mm Number of attempts: 3 Airway Equipment and Method: Stylet and Video-laryngoscopy Placement Confirmation: ETT inserted through vocal cords under direct vision, positive ETCO2 and breath sounds checked- equal and bilateral Secured at: 21 cm Tube secured with: Tape Dental Injury: Teeth and Oropharynx as per pre-operative assessment  Difficulty Due To: Difficulty was unanticipated, Difficult Airway- due to immobile epiglottis and Difficult Airway- due to anterior larynx Comments: Unsuccessful intubation Grade 3 view with MAC 4 blade. Pt repositioned on stretcher and remains grade 3 view. Glidescope S4 used with Grade 1 view.

## 2020-12-17 ENCOUNTER — Encounter (HOSPITAL_COMMUNITY): Payer: Self-pay | Admitting: Pulmonary Disease

## 2020-12-17 LAB — CYTOLOGY - NON PAP

## 2020-12-18 LAB — CYTOLOGY - NON PAP

## 2020-12-22 ENCOUNTER — Encounter: Payer: Self-pay | Admitting: Oncology

## 2020-12-22 NOTE — Telephone Encounter (Signed)
Bronch performed on 8/10. Will close encounter.

## 2020-12-23 ENCOUNTER — Other Ambulatory Visit: Payer: Self-pay

## 2020-12-23 ENCOUNTER — Ambulatory Visit (HOSPITAL_COMMUNITY)
Admission: RE | Admit: 2020-12-23 | Discharge: 2020-12-23 | Disposition: A | Discharge status: Home / Self Care | Payer: 59 | Source: Ambulatory Visit | Attending: Pulmonary Disease | Admitting: Pulmonary Disease

## 2020-12-23 DIAGNOSIS — I10 Essential (primary) hypertension: Secondary | ICD-10-CM | POA: Diagnosis not present

## 2020-12-23 DIAGNOSIS — G43909 Migraine, unspecified, not intractable, without status migrainosus: Secondary | ICD-10-CM | POA: Diagnosis not present

## 2020-12-23 DIAGNOSIS — Z9049 Acquired absence of other specified parts of digestive tract: Secondary | ICD-10-CM | POA: Diagnosis not present

## 2020-12-23 DIAGNOSIS — Z20822 Contact with and (suspected) exposure to covid-19: Secondary | ICD-10-CM | POA: Diagnosis not present

## 2020-12-23 DIAGNOSIS — Z90722 Acquired absence of ovaries, bilateral: Secondary | ICD-10-CM | POA: Diagnosis not present

## 2020-12-23 DIAGNOSIS — Z882 Allergy status to sulfonamides status: Secondary | ICD-10-CM | POA: Diagnosis not present

## 2020-12-23 DIAGNOSIS — R911 Solitary pulmonary nodule: Secondary | ICD-10-CM | POA: Diagnosis not present

## 2020-12-23 DIAGNOSIS — Z7951 Long term (current) use of inhaled steroids: Secondary | ICD-10-CM | POA: Diagnosis not present

## 2020-12-23 DIAGNOSIS — G4733 Obstructive sleep apnea (adult) (pediatric): Secondary | ICD-10-CM | POA: Diagnosis not present

## 2020-12-23 DIAGNOSIS — K432 Incisional hernia without obstruction or gangrene: Secondary | ICD-10-CM | POA: Diagnosis not present

## 2020-12-23 DIAGNOSIS — Z888 Allergy status to other drugs, medicaments and biological substances status: Secondary | ICD-10-CM | POA: Diagnosis not present

## 2020-12-23 DIAGNOSIS — R918 Other nonspecific abnormal finding of lung field: Secondary | ICD-10-CM

## 2020-12-23 DIAGNOSIS — M1990 Unspecified osteoarthritis, unspecified site: Secondary | ICD-10-CM | POA: Diagnosis not present

## 2020-12-23 DIAGNOSIS — Z9079 Acquired absence of other genital organ(s): Secondary | ICD-10-CM | POA: Diagnosis not present

## 2020-12-23 DIAGNOSIS — G4730 Sleep apnea, unspecified: Secondary | ICD-10-CM | POA: Diagnosis not present

## 2020-12-23 DIAGNOSIS — Z8543 Personal history of malignant neoplasm of ovary: Secondary | ICD-10-CM | POA: Diagnosis not present

## 2020-12-23 DIAGNOSIS — Z79891 Long term (current) use of opiate analgesic: Secondary | ICD-10-CM | POA: Diagnosis not present

## 2020-12-23 DIAGNOSIS — Z79890 Hormone replacement therapy (postmenopausal): Secondary | ICD-10-CM | POA: Diagnosis not present

## 2020-12-23 DIAGNOSIS — Z79899 Other long term (current) drug therapy: Secondary | ICD-10-CM | POA: Diagnosis not present

## 2020-12-23 DIAGNOSIS — K219 Gastro-esophageal reflux disease without esophagitis: Secondary | ICD-10-CM | POA: Diagnosis not present

## 2020-12-23 DIAGNOSIS — F419 Anxiety disorder, unspecified: Secondary | ICD-10-CM | POA: Diagnosis not present

## 2020-12-23 DIAGNOSIS — F32A Depression, unspecified: Secondary | ICD-10-CM | POA: Diagnosis not present

## 2020-12-23 DIAGNOSIS — E039 Hypothyroidism, unspecified: Secondary | ICD-10-CM | POA: Diagnosis not present

## 2020-12-23 DIAGNOSIS — Z90710 Acquired absence of both cervix and uterus: Secondary | ICD-10-CM | POA: Diagnosis not present

## 2020-12-23 MED ORDER — IOHEXOL 350 MG/ML SOLN
80.0000 mL | Freq: Once | INTRAVENOUS | Status: AC | PRN
  Administered 2020-12-23: 80 mL via INTRAVENOUS

## 2020-12-24 MED ORDER — BUPIVACAINE LIPOSOME 1.3 % IJ SUSP
20.0000 mL | Freq: Once | INTRAMUSCULAR | Status: DC
  Filled 2020-12-24: qty 20

## 2020-12-25 ENCOUNTER — Encounter (HOSPITAL_COMMUNITY): Payer: Self-pay | Admitting: General Surgery

## 2020-12-25 ENCOUNTER — Ambulatory Visit (HOSPITAL_COMMUNITY): Payer: 59 | Admitting: Physician Assistant

## 2020-12-25 ENCOUNTER — Inpatient Hospital Stay (HOSPITAL_COMMUNITY)
Admission: RE | Admit: 2020-12-25 | Discharge: 2020-12-29 | DRG: 355 | Disposition: A | Discharge status: Home / Self Care | Payer: 59 | Source: Ambulatory Visit | Attending: General Surgery | Admitting: General Surgery

## 2020-12-25 ENCOUNTER — Encounter (HOSPITAL_COMMUNITY)
Admission: RE | Disposition: A | Discharge status: Home / Self Care | Payer: Self-pay | Source: Ambulatory Visit | Attending: General Surgery

## 2020-12-25 DIAGNOSIS — Z882 Allergy status to sulfonamides status: Secondary | ICD-10-CM

## 2020-12-25 DIAGNOSIS — G4730 Sleep apnea, unspecified: Secondary | ICD-10-CM | POA: Diagnosis present

## 2020-12-25 DIAGNOSIS — K432 Incisional hernia without obstruction or gangrene: Secondary | ICD-10-CM | POA: Diagnosis not present

## 2020-12-25 DIAGNOSIS — Z79891 Long term (current) use of opiate analgesic: Secondary | ICD-10-CM

## 2020-12-25 DIAGNOSIS — E039 Hypothyroidism, unspecified: Secondary | ICD-10-CM | POA: Diagnosis present

## 2020-12-25 DIAGNOSIS — Z9049 Acquired absence of other specified parts of digestive tract: Secondary | ICD-10-CM

## 2020-12-25 DIAGNOSIS — Z20822 Contact with and (suspected) exposure to covid-19: Secondary | ICD-10-CM | POA: Diagnosis present

## 2020-12-25 DIAGNOSIS — Z8543 Personal history of malignant neoplasm of ovary: Secondary | ICD-10-CM

## 2020-12-25 DIAGNOSIS — Z90722 Acquired absence of ovaries, bilateral: Secondary | ICD-10-CM

## 2020-12-25 DIAGNOSIS — F32A Depression, unspecified: Secondary | ICD-10-CM | POA: Diagnosis present

## 2020-12-25 DIAGNOSIS — Z888 Allergy status to other drugs, medicaments and biological substances status: Secondary | ICD-10-CM

## 2020-12-25 DIAGNOSIS — Z90710 Acquired absence of both cervix and uterus: Secondary | ICD-10-CM

## 2020-12-25 DIAGNOSIS — Z79899 Other long term (current) drug therapy: Secondary | ICD-10-CM

## 2020-12-25 DIAGNOSIS — Z79890 Hormone replacement therapy (postmenopausal): Secondary | ICD-10-CM

## 2020-12-25 DIAGNOSIS — Z9079 Acquired absence of other genital organ(s): Secondary | ICD-10-CM

## 2020-12-25 DIAGNOSIS — F419 Anxiety disorder, unspecified: Secondary | ICD-10-CM | POA: Diagnosis present

## 2020-12-25 DIAGNOSIS — M1990 Unspecified osteoarthritis, unspecified site: Secondary | ICD-10-CM | POA: Diagnosis present

## 2020-12-25 DIAGNOSIS — I10 Essential (primary) hypertension: Secondary | ICD-10-CM | POA: Diagnosis not present

## 2020-12-25 DIAGNOSIS — K219 Gastro-esophageal reflux disease without esophagitis: Secondary | ICD-10-CM | POA: Diagnosis present

## 2020-12-25 DIAGNOSIS — Z7951 Long term (current) use of inhaled steroids: Secondary | ICD-10-CM

## 2020-12-25 DIAGNOSIS — G43909 Migraine, unspecified, not intractable, without status migrainosus: Secondary | ICD-10-CM | POA: Diagnosis not present

## 2020-12-25 HISTORY — PX: INCISIONAL HERNIA REPAIR: SHX193

## 2020-12-25 LAB — CBC
HCT: 43.2 % (ref 36.0–46.0)
Hemoglobin: 14.2 g/dL (ref 12.0–15.0)
MCH: 30.4 pg (ref 26.0–34.0)
MCHC: 32.9 g/dL (ref 30.0–36.0)
MCV: 92.5 fL (ref 80.0–100.0)
Platelets: 249 10*3/uL (ref 150–400)
RBC: 4.67 MIL/uL (ref 3.87–5.11)
RDW: 13 % (ref 11.5–15.5)
WBC: 17.8 10*3/uL — ABNORMAL HIGH (ref 4.0–10.5)
nRBC: 0 % (ref 0.0–0.2)

## 2020-12-25 LAB — CREATININE, SERUM
Creatinine, Ser: 0.88 mg/dL (ref 0.44–1.00)
GFR, Estimated: >60 mL/min (ref >60)

## 2020-12-25 LAB — SARS CORONAVIRUS 2 BY RT PCR (HOSPITAL ORDER, PERFORMED IN ~~LOC~~ HOSPITAL LAB): SARS Coronavirus 2: NEGATIVE

## 2020-12-25 SURGERY — REPAIR, HERNIA, INCISIONAL, LAPAROSCOPIC
Anesthesia: General | Site: Abdomen

## 2020-12-25 MED ORDER — ONDANSETRON 4 MG PO TBDP
4.0000 mg | ORAL_TABLET | Freq: Four times a day (QID) | ORAL | Status: DC | PRN
  Administered 2020-12-29: 4 mg via ORAL
  Filled 2020-12-25: qty 1

## 2020-12-25 MED ORDER — OXYCODONE HCL 5 MG PO TABS
5.0000 mg | ORAL_TABLET | Freq: Once | ORAL | Status: AC | PRN
Start: 2020-12-25 — End: 2020-12-25
  Administered 2020-12-25: 5 mg via ORAL

## 2020-12-25 MED ORDER — POLYETHYLENE GLYCOL 3350 17 G PO PACK: 17.0000 g | PACK | Freq: Every day | ORAL | Status: DC | PRN

## 2020-12-25 MED ORDER — BUPIVACAINE-EPINEPHRINE 0.25% -1:200000 IJ SOLN
INTRAMUSCULAR | Status: DC | PRN
  Administered 2020-12-25: 20 mL

## 2020-12-25 MED ORDER — ENSURE PRE-SURGERY PO LIQD: 296.0000 mL | Freq: Once | ORAL | Status: DC

## 2020-12-25 MED ORDER — BUPIVACAINE-EPINEPHRINE (PF) 0.25% -1:200000 IJ SOLN
INTRAMUSCULAR | Status: AC
  Filled 2020-12-25: qty 30

## 2020-12-25 MED ORDER — FENTANYL CITRATE (PF) 100 MCG/2ML IJ SOLN
INTRAMUSCULAR | Status: AC
  Filled 2020-12-25: qty 2

## 2020-12-25 MED ORDER — LOSARTAN POTASSIUM 25 MG PO TABS
25.0000 mg | ORAL_TABLET | Freq: Every day | ORAL | Status: DC
  Administered 2020-12-25 – 2020-12-29 (×5): 25 mg via ORAL
  Filled 2020-12-25 (×5): qty 1

## 2020-12-25 MED ORDER — PHENYLEPHRINE 40 MCG/ML (10ML) SYRINGE FOR IV PUSH (FOR BLOOD PRESSURE SUPPORT)
PREFILLED_SYRINGE | INTRAVENOUS | Status: DC | PRN
  Administered 2020-12-25: 80 ug via INTRAVENOUS

## 2020-12-25 MED ORDER — OXYCODONE HCL 5 MG PO TABS
ORAL_TABLET | ORAL | Status: AC
  Administered 2020-12-26: 10 mg via ORAL
  Filled 2020-12-25: qty 1

## 2020-12-25 MED ORDER — TRAZODONE HCL 50 MG PO TABS
25.0000 mg | ORAL_TABLET | Freq: Every evening | ORAL | Status: DC | PRN
  Administered 2020-12-25: 25 mg via ORAL
  Administered 2020-12-26 – 2020-12-28 (×3): 50 mg via ORAL
  Filled 2020-12-25 (×5): qty 1

## 2020-12-25 MED ORDER — DIPHENHYDRAMINE HCL 50 MG/ML IJ SOLN: 25.0000 mg | Freq: Four times a day (QID) | INTRAMUSCULAR | Status: DC | PRN

## 2020-12-25 MED ORDER — HYDROMORPHONE HCL 1 MG/ML IJ SOLN: 0.5000 mg | INTRAMUSCULAR | Status: DC | PRN

## 2020-12-25 MED ORDER — CYCLOBENZAPRINE HCL 5 MG PO TABS
5.0000 mg | ORAL_TABLET | Freq: Every evening | ORAL | Status: DC | PRN
  Administered 2020-12-25 – 2020-12-26 (×2): 5 mg via ORAL
  Filled 2020-12-25 (×2): qty 1

## 2020-12-25 MED ORDER — MIDAZOLAM HCL 5 MG/5ML IJ SOLN
INTRAMUSCULAR | Status: DC | PRN
  Administered 2020-12-25: 2 mg via INTRAVENOUS

## 2020-12-25 MED ORDER — LABETALOL HCL 5 MG/ML IV SOLN
INTRAVENOUS | Status: AC
  Filled 2020-12-25: qty 4

## 2020-12-25 MED ORDER — LABETALOL HCL 5 MG/ML IV SOLN
INTRAVENOUS | Status: DC | PRN
  Administered 2020-12-25 (×2): 5 mg via INTRAVENOUS

## 2020-12-25 MED ORDER — CEFAZOLIN IN SODIUM CHLORIDE 3-0.9 GM/100ML-% IV SOLN
3.0000 g | INTRAVENOUS | Status: AC
  Administered 2020-12-25: 3 g via INTRAVENOUS
  Filled 2020-12-25: qty 100

## 2020-12-25 MED ORDER — PHENYLEPHRINE 40 MCG/ML (10ML) SYRINGE FOR IV PUSH (FOR BLOOD PRESSURE SUPPORT)
PREFILLED_SYRINGE | INTRAVENOUS | Status: AC
  Filled 2020-12-25: qty 10

## 2020-12-25 MED ORDER — PROMETHAZINE HCL 25 MG/ML IJ SOLN
INTRAMUSCULAR | Status: AC
  Filled 2020-12-25: qty 1

## 2020-12-25 MED ORDER — BUPIVACAINE LIPOSOME 1.3 % IJ SUSP
INTRAMUSCULAR | Status: DC | PRN
  Administered 2020-12-25: 20 mL

## 2020-12-25 MED ORDER — KETOROLAC TROMETHAMINE 30 MG/ML IJ SOLN: 30.0000 mg | Freq: Once | INTRAMUSCULAR | Status: DC | PRN

## 2020-12-25 MED ORDER — SALINE SPRAY 0.65 % NA SOLN
1.0000 | NASAL | Status: DC | PRN
  Filled 2020-12-25: qty 44

## 2020-12-25 MED ORDER — KCL IN DEXTROSE-NACL 20-5-0.45 MEQ/L-%-% IV SOLN
INTRAVENOUS | Status: DC
  Filled 2020-12-25 (×5): qty 1000

## 2020-12-25 MED ORDER — OXYCODONE HCL 5 MG/5ML PO SOLN
5.0000 mg | Freq: Once | ORAL | Status: AC | PRN
Start: 2020-12-25 — End: 2020-12-25

## 2020-12-25 MED ORDER — KETOROLAC TROMETHAMINE 15 MG/ML IJ SOLN
15.0000 mg | INTRAMUSCULAR | Status: AC
Start: 2020-12-25 — End: 2020-12-25
  Administered 2020-12-25: 15 mg via INTRAVENOUS

## 2020-12-25 MED ORDER — ONDANSETRON HCL 4 MG/2ML IJ SOLN
INTRAMUSCULAR | Status: AC
  Filled 2020-12-25: qty 2

## 2020-12-25 MED ORDER — HYDROMORPHONE HCL 1 MG/ML IJ SOLN
1.0000 mg | INTRAMUSCULAR | Status: DC | PRN
  Administered 2020-12-25 – 2020-12-27 (×7): 1 mg via INTRAVENOUS
  Filled 2020-12-25 (×7): qty 1

## 2020-12-25 MED ORDER — LEVOTHYROXINE SODIUM 75 MCG PO TABS
75.0000 ug | ORAL_TABLET | Freq: Every day | ORAL | Status: DC
  Administered 2020-12-26 – 2020-12-29 (×4): 75 ug via ORAL
  Filled 2020-12-25 (×4): qty 1

## 2020-12-25 MED ORDER — HYDROMORPHONE HCL 1 MG/ML IJ SOLN
INTRAMUSCULAR | Status: DC | PRN
  Administered 2020-12-25 (×4): .5 mg via INTRAVENOUS

## 2020-12-25 MED ORDER — FENTANYL CITRATE (PF) 100 MCG/2ML IJ SOLN
25.0000 ug | INTRAMUSCULAR | Status: DC | PRN
  Administered 2020-12-25 (×3): 50 ug via INTRAVENOUS

## 2020-12-25 MED ORDER — MOMETASONE FURO-FORMOTEROL FUM 200-5 MCG/ACT IN AERO
2.0000 | INHALATION_SPRAY | Freq: Two times a day (BID) | RESPIRATORY_TRACT | Status: DC
  Administered 2020-12-25 – 2020-12-29 (×8): 2 via RESPIRATORY_TRACT
  Filled 2020-12-25: qty 8.8

## 2020-12-25 MED ORDER — DEXAMETHASONE SODIUM PHOSPHATE 4 MG/ML IJ SOLN
INTRAMUSCULAR | Status: DC | PRN
  Administered 2020-12-25: 10 mg via INTRAVENOUS

## 2020-12-25 MED ORDER — CHLORHEXIDINE GLUCONATE 0.12 % MT SOLN
15.0000 mL | Freq: Once | OROMUCOSAL | Status: AC
  Administered 2020-12-25: 15 mL via OROMUCOSAL

## 2020-12-25 MED ORDER — ENOXAPARIN SODIUM 40 MG/0.4ML IJ SOSY
40.0000 mg | PREFILLED_SYRINGE | INTRAMUSCULAR | Status: DC
  Administered 2020-12-26 – 2020-12-29 (×4): 40 mg via SUBCUTANEOUS
  Filled 2020-12-25 (×4): qty 0.4

## 2020-12-25 MED ORDER — AMISULPRIDE (ANTIEMETIC) 5 MG/2ML IV SOLN: 10.0000 mg | Freq: Once | INTRAVENOUS | Status: DC | PRN

## 2020-12-25 MED ORDER — CHLORHEXIDINE GLUCONATE CLOTH 2 % EX PADS: 6.0000 | MEDICATED_PAD | Freq: Once | CUTANEOUS | Status: DC

## 2020-12-25 MED ORDER — LIDOCAINE 2% (20 MG/ML) 5 ML SYRINGE
INTRAMUSCULAR | Status: DC | PRN
  Administered 2020-12-25: 50 mg via INTRAVENOUS

## 2020-12-25 MED ORDER — FENTANYL CITRATE (PF) 100 MCG/2ML IJ SOLN
INTRAMUSCULAR | Status: DC | PRN
  Administered 2020-12-25: 100 ug via INTRAVENOUS
  Administered 2020-12-25: 150 ug via INTRAVENOUS

## 2020-12-25 MED ORDER — IBUPROFEN 800 MG PO TABS: 800.0000 mg | ORAL_TABLET | Freq: Three times a day (TID) | ORAL | 0 refills | Status: DC | PRN

## 2020-12-25 MED ORDER — MIDAZOLAM HCL 2 MG/2ML IJ SOLN
INTRAMUSCULAR | Status: AC
  Filled 2020-12-25: qty 2

## 2020-12-25 MED ORDER — KETOROLAC TROMETHAMINE 30 MG/ML IJ SOLN
30.0000 mg | Freq: Four times a day (QID) | INTRAMUSCULAR | Status: DC | PRN
  Administered 2020-12-26 – 2020-12-27 (×4): 30 mg via INTRAVENOUS
  Filled 2020-12-25 (×4): qty 1

## 2020-12-25 MED ORDER — DIPHENHYDRAMINE HCL 25 MG PO CAPS
25.0000 mg | ORAL_CAPSULE | Freq: Four times a day (QID) | ORAL | Status: DC | PRN
  Administered 2020-12-26 – 2020-12-28 (×2): 25 mg via ORAL
  Filled 2020-12-25 (×2): qty 1

## 2020-12-25 MED ORDER — EPHEDRINE SULFATE-NACL 50-0.9 MG/10ML-% IV SOSY
PREFILLED_SYRINGE | INTRAVENOUS | Status: DC | PRN
  Administered 2020-12-25: 10 mg via INTRAVENOUS

## 2020-12-25 MED ORDER — TRAMADOL HCL 50 MG PO TABS
50.0000 mg | ORAL_TABLET | Freq: Four times a day (QID) | ORAL | Status: DC | PRN
  Administered 2020-12-27: 50 mg via ORAL
  Filled 2020-12-25: qty 1

## 2020-12-25 MED ORDER — ACETAMINOPHEN 500 MG PO TABS
1000.0000 mg | ORAL_TABLET | Freq: Four times a day (QID) | ORAL | Status: DC
  Administered 2020-12-25 – 2020-12-29 (×14): 1000 mg via ORAL
  Filled 2020-12-25 (×14): qty 2

## 2020-12-25 MED ORDER — ACETAMINOPHEN 500 MG PO TABS
ORAL_TABLET | ORAL | Status: AC
  Administered 2020-12-25: 1000 mg via ORAL
  Filled 2020-12-25: qty 2

## 2020-12-25 MED ORDER — ROCURONIUM BROMIDE 10 MG/ML (PF) SYRINGE
PREFILLED_SYRINGE | INTRAVENOUS | Status: DC | PRN
  Administered 2020-12-25: 60 mg via INTRAVENOUS
  Administered 2020-12-25: 10 mg via INTRAVENOUS
  Administered 2020-12-25: 20 mg via INTRAVENOUS

## 2020-12-25 MED ORDER — FENTANYL CITRATE (PF) 100 MCG/2ML IJ SOLN
50.0000 ug | Freq: Once | INTRAMUSCULAR | Status: AC
  Administered 2020-12-25: 50 ug via INTRAVENOUS

## 2020-12-25 MED ORDER — LIP MEDEX EX OINT
TOPICAL_OINTMENT | CUTANEOUS | Status: DC | PRN
  Filled 2020-12-25: qty 7

## 2020-12-25 MED ORDER — EPHEDRINE 5 MG/ML INJ
INTRAVENOUS | Status: AC
  Filled 2020-12-25: qty 5

## 2020-12-25 MED ORDER — PREDNISONE 5 MG PO TABS
10.0000 mg | ORAL_TABLET | Freq: Every day | ORAL | Status: DC
  Administered 2020-12-26 – 2020-12-29 (×4): 10 mg via ORAL
  Filled 2020-12-25 (×4): qty 2

## 2020-12-25 MED ORDER — ALPRAZOLAM 1 MG PO TABS
1.0000 mg | ORAL_TABLET | Freq: Two times a day (BID) | ORAL | Status: DC | PRN
  Administered 2020-12-25 – 2020-12-26 (×2): 1 mg via ORAL
  Filled 2020-12-25 (×2): qty 1

## 2020-12-25 MED ORDER — SIMETHICONE 80 MG PO CHEW
40.0000 mg | CHEWABLE_TABLET | Freq: Four times a day (QID) | ORAL | Status: DC | PRN
  Administered 2020-12-26: 40 mg via ORAL
  Filled 2020-12-25: qty 1

## 2020-12-25 MED ORDER — OXYCODONE HCL 5 MG PO TABS
5.0000 mg | ORAL_TABLET | ORAL | Status: DC | PRN
  Administered 2020-12-26 – 2020-12-29 (×10): 10 mg via ORAL
  Administered 2020-12-29: 5 mg via ORAL
  Filled 2020-12-25 (×11): qty 2
  Filled 2020-12-25: qty 1

## 2020-12-25 MED ORDER — FLUTICASONE PROPIONATE 50 MCG/ACT NA SUSP
2.0000 | Freq: Every day | NASAL | Status: DC
  Administered 2020-12-25 – 2020-12-27 (×3): 2 via NASAL
  Filled 2020-12-25: qty 16

## 2020-12-25 MED ORDER — LACTATED RINGERS IV SOLN: INTRAVENOUS | Status: DC

## 2020-12-25 MED ORDER — ACETAMINOPHEN 500 MG PO TABS: 1000.0000 mg | ORAL_TABLET | ORAL | Status: AC

## 2020-12-25 MED ORDER — KETOROLAC TROMETHAMINE 30 MG/ML IJ SOLN
INTRAMUSCULAR | Status: AC
  Filled 2020-12-25: qty 1

## 2020-12-25 MED ORDER — PROPOFOL 10 MG/ML IV BOLUS
INTRAVENOUS | Status: DC | PRN
  Administered 2020-12-25: 100 mg via INTRAVENOUS
  Administered 2020-12-25: 200 mg via INTRAVENOUS

## 2020-12-25 MED ORDER — CEFAZOLIN SODIUM-DEXTROSE 2-4 GM/100ML-% IV SOLN
INTRAVENOUS | Status: AC
  Filled 2020-12-25: qty 100

## 2020-12-25 MED ORDER — SCOPOLAMINE 1 MG/3DAYS TD PT72
1.0000 | MEDICATED_PATCH | TRANSDERMAL | Status: DC
  Administered 2020-12-25: 1.5 mg via TRANSDERMAL
  Filled 2020-12-25: qty 1

## 2020-12-25 MED ORDER — DEXAMETHASONE SODIUM PHOSPHATE 10 MG/ML IJ SOLN
INTRAMUSCULAR | Status: AC
  Filled 2020-12-25: qty 1

## 2020-12-25 MED ORDER — PROMETHAZINE HCL 25 MG/ML IJ SOLN
6.2500 mg | INTRAMUSCULAR | Status: DC | PRN
  Administered 2020-12-25: 6.25 mg via INTRAVENOUS

## 2020-12-25 MED ORDER — SERTRALINE HCL 100 MG PO TABS
100.0000 mg | ORAL_TABLET | Freq: Every day | ORAL | Status: DC
  Administered 2020-12-25 – 2020-12-29 (×5): 100 mg via ORAL
  Filled 2020-12-25 (×5): qty 1

## 2020-12-25 MED ORDER — PANTOPRAZOLE SODIUM 40 MG PO TBEC
40.0000 mg | DELAYED_RELEASE_TABLET | Freq: Every day | ORAL | Status: DC
  Administered 2020-12-25 – 2020-12-29 (×5): 40 mg via ORAL
  Filled 2020-12-25 (×5): qty 1

## 2020-12-25 MED ORDER — GABAPENTIN 300 MG PO CAPS
600.0000 mg | ORAL_CAPSULE | Freq: Three times a day (TID) | ORAL | Status: DC
  Administered 2020-12-25 – 2020-12-29 (×12): 600 mg via ORAL
  Filled 2020-12-25 (×12): qty 2

## 2020-12-25 MED ORDER — ONDANSETRON HCL 4 MG/2ML IJ SOLN
4.0000 mg | Freq: Four times a day (QID) | INTRAMUSCULAR | Status: DC | PRN
  Administered 2020-12-25: 4 mg via INTRAVENOUS
  Filled 2020-12-25: qty 2

## 2020-12-25 MED ORDER — HYDROMORPHONE HCL 2 MG/ML IJ SOLN
INTRAMUSCULAR | Status: AC
  Filled 2020-12-25: qty 1

## 2020-12-25 MED ORDER — METOPROLOL TARTRATE 5 MG/5ML IV SOLN: 5.0000 mg | Freq: Four times a day (QID) | INTRAVENOUS | Status: DC | PRN

## 2020-12-25 MED ORDER — FENTANYL CITRATE (PF) 250 MCG/5ML IJ SOLN
INTRAMUSCULAR | Status: AC
  Filled 2020-12-25: qty 5

## 2020-12-25 MED ORDER — OXYCODONE HCL 5 MG PO TABS: 5.0000 mg | ORAL_TABLET | Freq: Four times a day (QID) | ORAL | 0 refills | Status: DC | PRN

## 2020-12-25 SURGICAL SUPPLY — 42 items
APL SKNCLS STERI-STRIP NONHPOA (GAUZE/BANDAGES/DRESSINGS) ×1
APPLIER CLIP 5 13 M/L LIGAMAX5 (MISCELLANEOUS)
APR CLP MED LRG 5 ANG JAW (MISCELLANEOUS)
BAG COUNTER SPONGE SURGICOUNT (BAG) IMPLANT
BENZOIN TINCTURE PRP APPL 2/3 (GAUZE/BANDAGES/DRESSINGS) ×2 IMPLANT
BINDER ABDOMINAL 12 ML 46-62 (SOFTGOODS) ×2 IMPLANT
BNDG ADH 1X3 SHEER STRL LF (GAUZE/BANDAGES/DRESSINGS) ×6 IMPLANT
BNDG ADH THN 3X1 STRL LF (GAUZE/BANDAGES/DRESSINGS) ×3
CABLE HIGH FREQUENCY MONO STRZ (ELECTRODE) ×2 IMPLANT
CHLORAPREP W/TINT 26 (MISCELLANEOUS) ×2 IMPLANT
CLIP APPLIE 5 13 M/L LIGAMAX5 (MISCELLANEOUS) IMPLANT
COVER SURGICAL LIGHT HANDLE (MISCELLANEOUS) ×2 IMPLANT
DECANTER SPIKE VIAL GLASS SM (MISCELLANEOUS) ×2 IMPLANT
DEVICE SECURE STRAP 25 ABSORB (INSTRUMENTS) IMPLANT
DRAIN CHANNEL 19F RND (DRAIN) IMPLANT
ELECT REM PT RETURN 15FT ADLT (MISCELLANEOUS) ×2 IMPLANT
EVACUATOR SILICONE 100CC (DRAIN) IMPLANT
GLOVE SURG POLYISO LF SZ7 (GLOVE) ×2 IMPLANT
GLOVE SURG UNDER POLY LF SZ7 (GLOVE) ×2 IMPLANT
GOWN STRL REUS W/TWL LRG LVL3 (GOWN DISPOSABLE) ×2 IMPLANT
GOWN STRL REUS W/TWL XL LVL3 (GOWN DISPOSABLE) ×4 IMPLANT
GRASPER SUT TROCAR 14GX15 (MISCELLANEOUS) ×2 IMPLANT
KIT BASIN OR (CUSTOM PROCEDURE TRAY) ×2 IMPLANT
KIT TURNOVER KIT A (KITS) ×2 IMPLANT
MESH VENTRALIGHT ST 6X8 (Mesh Specialty) ×2 IMPLANT
MESH VENTRLGHT ELLIPSE 8X6XMFL (Mesh Specialty) ×1 IMPLANT
PENCIL SMOKE EVACUATOR (MISCELLANEOUS) IMPLANT
SCISSORS LAP 5X35 DISP (ENDOMECHANICALS) ×2 IMPLANT
SET IRRIG TUBING LAPAROSCOPIC (IRRIGATION / IRRIGATOR) IMPLANT
SET TUBE SMOKE EVAC HIGH FLOW (TUBING) ×2 IMPLANT
SHEARS HARMONIC ACE PLUS 36CM (ENDOMECHANICALS) IMPLANT
SLEEVE XCEL OPT CAN 5 100 (ENDOMECHANICALS) ×2 IMPLANT
STRIP CLOSURE SKIN 1/2X4 (GAUZE/BANDAGES/DRESSINGS) ×2 IMPLANT
SUT ETHILON 2 0 PS N (SUTURE) IMPLANT
SUT MNCRL AB 4-0 PS2 18 (SUTURE) ×2 IMPLANT
SUT NOVA NAB GS-21 0 18 T12 DT (SUTURE) ×4 IMPLANT
SUT PDS AB 0 CT 36 (SUTURE) ×6 IMPLANT
SUT VICRYL 0 UR6 27IN ABS (SUTURE) IMPLANT
TOWEL OR 17X26 10 PK STRL BLUE (TOWEL DISPOSABLE) ×2 IMPLANT
TRAY LAPAROSCOPIC (CUSTOM PROCEDURE TRAY) ×2 IMPLANT
TROCAR BLADELESS OPT 5 100 (ENDOMECHANICALS) ×2 IMPLANT
TROCAR XCEL 12X100 BLDLESS (ENDOMECHANICALS) ×2 IMPLANT

## 2020-12-25 NOTE — Transfer of Care (Signed)
Immediate Anesthesia Transfer of Care Note  Patient: [name]  Procedure(s) Performed: LAPAROSCOPIC INCISIONAL HERNIA REPAIR WITH MESH (Abdomen)  Patient Location: PACU  Anesthesia Type:General  Level of Consciousness: awake, alert , oriented and patient cooperative  Airway & Oxygen Therapy: Patient Spontanous Breathing and Patient connected to face mask  Post-op Assessment: Report given to RN and Post -op Vital signs reviewed and stable  Post vital signs: Reviewed and stable  Last Vitals:  Vitals Value Taken Time  BP 116/77 12/25/20 1348  Temp    Pulse 81 12/25/20 1352  Resp 24 12/25/20 1352  SpO2 100 % 12/25/20 1352  Vitals shown include unvalidated device data.  Last Pain:  Vitals:   12/25/20 0940  PainSc: 0-No pain         Complications: No notable events documented.

## 2020-12-25 NOTE — H&P (Signed)
[name] is an 50 y.o. female.   Chief Complaint: incisional hernia HPI: 50 yo female with midline incisional hernia. It has been causing constipation and pain. She presents for repair.  Past Medical History:  Diagnosis Date   Anxiety    Arthritis    Depression    Dyspnea    Elevated blood pressure, situational 05/05/2016   Endometriosis    Exercise-induced asthma 09/08/2015   GERD (gastroesophageal reflux disease)    History of kidney stones    Hypothyroidism    Migraine    Ovarian cancer, bilateral 11/28/2014   PONV (postoperative nausea and vomiting)    Sleep apnea    Thyroid disease    Umbilical hernia    woke up while porta cath removed     Past Surgical History:  Procedure Laterality Date   ABDOMINAL HYSTERECTOMY  11/10/2014   at [facility], Exp lap, supracervical hyst, BSO, infracolic omentectomy, lymphadenectomy, aortic lymph node sampling   BRONCHIAL NEEDLE ASPIRATION BIOPSY  12/16/2020   Procedure: BRONCHIAL NEEDLE ASPIRATION BIOPSIES;  Surgeon: Rigoberto Noel, MD;  Location: [facility];  Service: Cardiopulmonary;;   BRONCHIAL WASHINGS  12/16/2020   Procedure: BRONCHIAL WASHINGS;  Surgeon: Rigoberto Noel, MD;  Location: [facility];  Service: Cardiopulmonary;;   CHOLECYSTECTOMY N/A 07/24/2018   Procedure: LAPAROSCOPIC CHOLECYSTECTOMY WITH INTRAOPERATIVE CHOLANGIOGRAM;  Surgeon: Armandina Gemma, MD;  Location: [facility];  Service: General;  Laterality: N/A;   ERCP N/A 07/26/2018   Procedure: ENDOSCOPIC RETROGRADE CHOLANGIOPANCREATOGRAPHY (ERCP);  Surgeon: Ladene Artist, MD;  Location: [facility];  Service: Endoscopy;  Laterality: N/A;   GANGLION CYST EXCISION Right    hand   IR GENERIC HISTORICAL  05/12/2016   IR REMOVAL TUN ACCESS W/ PORT W/O FL MOD SED 05/12/2016 [facility]   LIPOMA EXCISION Left    ankle   MENISCUS REPAIR Right    Porta a cath removal     PORTA CATH INSERTION     REMOVAL OF STONES  07/26/2018   Procedure: REMOVAL OF STONES;  Surgeon: Ladene Artist, MD;  Location: [facility];  Service: Endoscopy;;   SPHINCTEROTOMY  07/26/2018   Procedure: Joan Mayans;  Surgeon: Ladene Artist, MD;  Location: [facility];  Service: Endoscopy;;   VIDEO BRONCHOSCOPY WITH ENDOBRONCHIAL ULTRASOUND N/A 12/16/2020   Procedure: VIDEO BRONCHOSCOPY WITH ENDOBRONCHIAL ULTRASOUND;  Surgeon: Rigoberto Noel, MD;  [facility];  Service: Cardiopulmonary;  Laterality: N/A;    Family History  Problem Relation Age of Onset   Hypertension Mother    Diabetes Mother    Stroke Mother        Deceased   Heart disease Mother    Osteoarthritis Mother    Atrial fibrillation Father        Living   Stroke Father        optic nerve left eye   Healthy Brother    Diabetes Maternal Aunt    Heart failure Maternal Grandmother    Heart attack Maternal Grandfather    Dementia Paternal Grandmother    Dementia Paternal Grandfather    Social History:  reports that she has never smoked. She has never used smokeless tobacco. She reports current alcohol use. She reports that she does not use drugs.  Allergies:  Allergies  Allergen Reactions   Moxifloxacin Anaphylaxis    needs epinephrine shot   Quinolones Anaphylaxis, Shortness Of Breath and Swelling   Doxycycline Nausea And Vomiting   Escitalopram Oxalate Other (See Comments)     fatigue  Lisinopril Cough   Sulfonamide Derivatives Swelling and Rash    needs epinephrine shot--rash and lip swelling    Medications Prior to Admission  Medication Sig Dispense Refill   acetaminophen (TYLENOL) 650 MG CR tablet Take 1,300 mg by mouth daily as needed for pain.     ALPRAZolam (XANAX) 1 MG tablet Take 1 tablet (1 mg total) by mouth 2 (two) times daily as needed for anxiety. 60 tablet 2   benzonatate (TESSALON PERLES) 100 MG capsule 1 - 2 tabs by mouth three times daily as needed for cough 60 capsule 2   cyclobenzaprine (FLEXERIL) 5 MG tablet Take 1 tablet (5 mg total) by mouth at bedtime as needed for  muscle spasms. 90 tablet 3   estradiol (VIVELLE-DOT) 0.1 MG/24HR patch Place 1 patch (0.1 mg total) onto the skin 2 (two) times a week. 8 patch 12   fluticasone (FLONASE) 50 MCG/ACT nasal spray Place 2 sprays into both nostrils daily. In each nostril 16 g 1   Fluticasone-Salmeterol (ADVAIR DISKUS) 250-50 MCG/DOSE AEPB Inhale 1 puff into the lungs 2 (two) times daily. 1 each 3   gabapentin (NEURONTIN) 600 MG tablet Take 1 tablet (600 mg total) by mouth 3 (three) times daily. 270 tablet 3   hydroxypropyl methylcellulose / hypromellose (ISOPTO TEARS / GONIOVISC) 2.5 % ophthalmic solution Place 1 drop into both eyes daily as needed for dry eyes.     ibuprofen (ADVIL) 200 MG tablet Take 400 mg by mouth every 6 (six) hours as needed for moderate pain.     levothyroxine (SYNTHROID) 75 MCG tablet TAKE 1 TABLET BY MOUTH EVERY DAY (Patient taking differently: Take 75 mcg by mouth daily before breakfast.) 30 tablet 0   losartan (COZAAR) 25 MG tablet Take 1 tablet (25 mg total) by mouth daily. 90 tablet 1   omeprazole (PRILOSEC) 20 MG capsule Take 20 mg by mouth daily.     predniSONE (DELTASONE) 5 MG tablet Take 2 tablets (10 mg total) by mouth daily with breakfast. 60 tablet 0   sertraline (ZOLOFT) 100 MG tablet TAKE 2 TABLETS (200 MG TOTAL) BY MOUTH DAILY. (Patient taking differently: Take 100 mg by mouth in the morning and at bedtime.) 180 tablet 3   sodium chloride (OCEAN) 0.65 % SOLN nasal spray Place 1 spray into both nostrils daily as needed for congestion.     traZODone (DESYREL) 50 MG tablet Take 0.5-1 tablets (25-50 mg total) by mouth at bedtime as needed for sleep. (Patient taking differently: Take 50 mg by mouth at bedtime.) 90 tablet 1   Blood Pressure Monitoring (OMRON 3 SERIES BP MONITOR) DEVI Use as directed. 1 each 0    Results for orders placed or performed during the hospital encounter of 12/25/20 (from the past 48 hour(s))  SARS Coronavirus 2 by RT PCR (hospital order, performed in [facility] hospital lab) Nasopharyngeal Nasopharyngeal Swab     Status: None   Collection Time: 12/25/20  9:11 AM   Specimen: Nasopharyngeal Swab  Result Value Ref Range   SARS Coronavirus 2 NEGATIVE NEGATIVE    Comment: (NOTE) SARS-CoV-2 target nucleic acids are NOT DETECTED.  The SARS-CoV-2 RNA is generally detectable in upper and lower respiratory specimens during the acute phase of infection. The lowest concentration of SARS-CoV-2 viral copies this assay can detect is 250 copies / mL. A negative result does not preclude SARS-CoV-2 infection and should not be used as the sole basis for treatment or other patient management decisions.  A negative result  may occur with improper specimen collection / handling, submission of specimen other than nasopharyngeal swab, presence of viral mutation(s) within the areas targeted by this assay, and inadequate number of viral copies (<250 copies / mL). A negative result must be combined with clinical observations, patient history, and epidemiological information.  Fact Sheet for Patients:   StrictlyIdeas.no  Fact Sheet for Healthcare Providers: BankingDealers.co.za  This test is not yet approved or  cleared by the [place] FDA and has been authorized for detection and/or diagnosis of SARS-CoV-2 by FDA under an Emergency Use Authorization (EUA).  This EUA will remain in effect (meaning this test can be used) for the duration of the COVID-19 declaration under Section 564(b)(1) of the Act, 21 U.S.C. section 360bbb-3(b)(1), unless the authorization is terminated or revoked sooner.  Performed at [facility], [place] [address] [place], [place] [address]    CT Chest W Contrast  Result Date: 12/24/2020 CLINICAL DATA:  Follow-up lung nodule EXAM: CT CHEST WITH CONTRAST TECHNIQUE: Multidetector CT imaging of the chest was performed during intravenous contrast administration. CONTRAST:   44m OMNIPAQUE IOHEXOL 350 MG/ML SOLN COMPARISON:  09/14/2020 FINDINGS: Cardiovascular: Heart size is normal.  No pericardial effusion. Mediastinum/Nodes: Normal appearance of the thyroid gland. The trachea appears patent and is midline. Normal appearance of the esophagus. No enlarged lymph nodes identified. Lungs/Pleura: No pleural effusion. No airspace consolidation, atelectasis, or pneumothorax. Scattered lung nodules are again noted: Pulmonary nodule in the periphery of the right middle lobe measures 3 mm. Formally 6 mm. Resolution of previous peribronchovascular nodule in the left upper lobe measuring 6 mm. Tiny nodule within the anterior right middle lobe is stable measuring 2-3 mm, image 75/7. Perifissural nodule along the oblique fissure of the left lung has decreased in size from previous exam currently measuring 3 mm. Formally 6 mm. 3 mm lingular nodule is identified, image 55/7.  Formally 5 mm. Previous 7 mm nodule in the anterior left lung base has resolved in the interval. No new or enlarging pulmonary nodules or masses. Upper Abdomen: No acute abnormality. Musculoskeletal: No chest wall abnormality. No acute or significant osseous findings. IMPRESSION: Interval improvement in bilateral pulmonary nodules most suggestive of benign process. No new or enlarging pulmonary nodules or masses. In a patient that may be at risk for pulmonary metastasis consider continued interval surveillance of these nodules to ensure longer term stability. No enlarged mediastinal or hilar lymph nodes identified. Electronically Signed   By: TKerby MoorsM.D.   On: 12/24/2020 15:46    Review of Systems  Constitutional:  Negative for chills and fever.  HENT:  Negative for hearing loss.   Respiratory:  Negative for cough.   Cardiovascular:  Negative for chest pain and palpitations.  Gastrointestinal:  Negative for abdominal pain, nausea and vomiting.  Genitourinary:  Negative for dysuria and urgency.  Musculoskeletal:   Negative for myalgias and neck pain.  Skin:  Negative for rash.  Neurological:  Negative for dizziness and headaches.  Hematological:  Does not bruise/bleed easily.  Psychiatric/Behavioral:  Negative for suicidal ideas.    Blood pressure (!) 150/80, pulse 80, temperature 97.8 F (36.6 C), resp. rate 15, height '5\' 7"'$  (1.702 m), weight (!) 139.7 kg, last menstrual period 11/02/2014, SpO2 99 %. Physical Exam Vitals reviewed.  Constitutional:      Appearance: She is well-developed.  HENT:     Head: Normocephalic and atraumatic.  Eyes:     Conjunctiva/sclera: Conjunctivae normal.     Pupils: Pupils are equal,  round, and reactive to light.  Cardiovascular:     Rate and Rhythm: Normal rate and regular rhythm.  Pulmonary:     Effort: Pulmonary effort is normal.     Breath sounds: Normal breath sounds.  Abdominal:     General: Bowel sounds are normal. There is no distension.     Palpations: Abdomen is soft.     Tenderness: There is no abdominal tenderness.     Comments: Incisional hernia  Musculoskeletal:        General: Normal range of motion.     Cervical back: Normal range of motion and neck supple.  Skin:    General: Skin is warm and dry.  Neurological:     Mental Status: She is alert and oriented to person, place, and time.  Psychiatric:        Behavior: Behavior normal.     Assessment/Plan 50 yo female with incisional hernia -lap incisional hernia repair with mesh -observation  [name], MD 12/25/2020, 10:52 AM

## 2020-12-25 NOTE — Anesthesia Preprocedure Evaluation (Addendum)
Anesthesia Evaluation  Patient identified by MRN, date of birth, ID band Patient awake    Reviewed: Allergy & Precautions, NPO status , Patient's Chart, lab work & pertinent test results  History of Anesthesia Complications (+) PONV and history of anesthetic complications  Airway Mallampati: II  TM Distance: >3 FB Neck ROM: Full    Dental no notable dental hx.    Pulmonary asthma , sleep apnea and Continuous Positive Airway Pressure Ventilation ,    Pulmonary exam normal breath sounds clear to auscultation       Cardiovascular hypertension, Pt. on medications Normal cardiovascular exam Rhythm:Regular Rate:Normal  ECG: NSR, rate 85   Neuro/Psych  Headaches, PSYCHIATRIC DISORDERS Anxiety Depression  Neuromuscular disease    GI/Hepatic Neg liver ROS, GERD  Medicated and Controlled,  Endo/Other  Hypothyroidism Morbid obesity  Renal/GU negative Renal ROS     Musculoskeletal  (+) Arthritis ,   Abdominal (+) + obese,   Peds  Hematology negative hematology ROS (+)   Anesthesia Other Findings INCISIONAL HERNIA  Reproductive/Obstetrics S/p hysterectomy                            Anesthesia Physical Anesthesia Plan  ASA: 3  Anesthesia Plan: General   Post-op Pain Management:    Induction: Intravenous  PONV Risk Score and Plan: 4 or greater and Ondansetron, Dexamethasone, Midazolam, Scopolamine patch - Pre-op and Treatment may vary due to age or medical condition  Airway Management Planned: Oral ETT  Additional Equipment:   Intra-op Plan:   Post-operative Plan: Extubation in OR  Informed Consent: I have reviewed the patients History and Physical, chart, labs and discussed the procedure including the risks, benefits and alternatives for the proposed anesthesia with the patient or authorized representative who has indicated his/her understanding and acceptance.     Dental advisory  given  Plan Discussed with: CRNA  Anesthesia Plan Comments:         Anesthesia Quick Evaluation

## 2020-12-25 NOTE — Anesthesia Postprocedure Evaluation (Signed)
Anesthesia Post Note  Patient: Denice Scofield  Procedure(s) Performed: LAPAROSCOPIC INCISIONAL HERNIA REPAIR WITH MESH (Abdomen)     Patient location during evaluation: PACU Anesthesia Type: General Level of consciousness: awake Pain management: pain level controlled Vital Signs Assessment: post-procedure vital signs reviewed and stable Respiratory status: spontaneous breathing, nonlabored ventilation, respiratory function stable and patient connected to nasal cannula oxygen Cardiovascular status: blood pressure returned to baseline and stable Postop Assessment: no apparent nausea or vomiting Anesthetic complications: no   No notable events documented.  Last Vitals:  Vitals:   12/25/20 1545 12/25/20 1620  BP: 113/63 118/63  Pulse: 77 76  Resp: 12 15  Temp: 37.1 C 36.7 C  SpO2: 96% 99%    Last Pain:  Vitals:   12/25/20 1620  TempSrc: Oral  PainSc:                  Fani Rotondo P Noland Pizano

## 2020-12-25 NOTE — Op Note (Signed)
Preoperative diagnosis: incisional hernia without obstruction or gangrene  Postoperative diagnosis: Same   Procedure: laparoscopic incisional hernia repair with placement of underlay mesh  Surgeon: Gurney Maxin, M.D.  Asst: Zacarias Pontes MD  Anesthesia: Gen.   Indications for procedure: [name] is a 50 y.o. female with symptoms of abdominal pain and hernia reducible containing colon on exam.  Description of procedure: The patient was brought into the operative suite, placed supine. Anesthesia was administered with endotracheal tube. Patient was strapped in place and foot board was secured. Both arms were tucked. All pressure points were offloaded by foam padding. The patient was prepped and draped in the usual sterile fashion.  A small incision was made over the left subcostal area and 61m trocar was placed with optical entry. Pneumoperitoneum was applied with high flow low pressure. The abdominal cavity was inspected and a large 5x5cm hernia at the umbilicus containing omentum and colon was visualized. 1 111mtrocar was placed in the mid left abdomen and 1 additional 44m59mrocar was placed in the LLQ.  Bilateral TAP blocks were placed with Marcaine/Exparel mix.  Blunt dissection was used to remove most of the filmy adhesions with occasional sharp dissection. Cautery was used to provide hemostasis. The hernia was identified and was filled with omentum and colon. It was slowly dissected free and reduced. The defect was about 5 cm 5 cm. The defect was repaired with interrupted 0 novafil. A 10x15cm ventralight mesh was inserted and placed beneath the fascia and peritoneum to the repair the mesh. Eight transfascial 0 novafil sutures were used to secure the mesh in place and absorbable tackers were used to appose the mesh against the abdominal wall in all areas.  The abdominal contents were again inspected and hemostasis was intact.  An interrupted 0 PDS was used to close the fascial  defect of the 89m58mocar site using suture passer. Pneumoperitoneum was removed, all trocar were removed. All incisions were closed with 4-0 monocryl subcuticular stitch. The patient woke from anesthesia and was brought to PACU in stable condition.  Findings: 5x5 cm hernia containing omentum and colon  Specimen: none  Blood loss: 15 ml  Local anesthesia:  40 ml [id number]  Complications: No immediate surgical complications  Implant: [id number] ST mesh   LukeGurney MaxinD. General, Bariatric, & Minimally Invasive Surgery [facility]

## 2020-12-25 NOTE — Anesthesia Procedure Notes (Signed)
Procedure Name: Intubation Date/Time: 12/25/2020 12:00 PM Performed by: Claudia Desanctis, CRNA Pre-anesthesia Checklist: Patient identified, Emergency Drugs available, Suction available and Patient being monitored Patient Re-evaluated:Patient Re-evaluated prior to induction Oxygen Delivery Method: Circle system utilized Preoxygenation: Pre-oxygenation with 100% oxygen Induction Type: IV induction Ventilation: Mask ventilation without difficulty Laryngoscope Size: 2 and Miller Grade View: Grade I Tube type: Oral Tube size: 7.0 mm Number of attempts: 1 Airway Equipment and Method: Stylet Placement Confirmation: ETT inserted through vocal cords under direct vision, positive ETCO2 and breath sounds checked- equal and bilateral Secured at: 22 cm Tube secured with: Tape Dental Injury: Teeth and Oropharynx as per pre-operative assessment

## 2020-12-25 NOTE — Discharge Instructions (Signed)
[facility] _______[facility]  UMBILICAL OR INGUINAL HERNIA REPAIR: POST OP INSTRUCTIONS  Always review your discharge instruction sheet given to you by the facility where your surgery was performed. IF YOU HAVE DISABILITY OR FAMILY LEAVE FORMS, YOU MUST BRING THEM TO THE OFFICE FOR PROCESSING.   DO NOT GIVE THEM TO YOUR DOCTOR.  1. A  prescription for pain medication may be given to you upon discharge.  Take your pain medication as prescribed, if needed.  If narcotic pain medicine is not needed, then you may take acetaminophen (Tylenol) or ibuprofen (Advil) as needed. 2. Take your usually prescribed medications unless otherwise directed. If you need a refill on your pain medication, please contact your pharmacy.  They will contact our office to request authorization. Prescriptions will not be filled after 5 pm or on week-ends. 3. You should follow a light diet the first 24 hours after arrival home, such as soup and crackers, etc.  Be sure to include lots of fluids daily.  Resume your normal diet the day after surgery. 4.Most patients will experience some swelling and bruising around the umbilicus or in the groin and scrotum.  Ice packs and reclining will help.  Swelling and bruising can take several days to resolve.  6. It is common to experience some constipation if taking pain medication after surgery.  Increasing fluid intake and taking a stool softener (such as Colace) will usually help or prevent this problem from occurring.  A mild laxative (Milk of Magnesia or Miralax) should be taken according to package directions if there are no bowel movements after 48 hours. 7. Unless discharge instructions indicate otherwise, you may remove your bandages 24-48 hours after surgery, and you may shower at that time.  You may have steri-strips (small skin tapes) in place directly over the incision.  These strips should be left on the skin for 7-10 days.  If your surgeon used skin glue on the  incision, you may shower in 24 hours.  The glue will flake off over the next 2-3 weeks.  Any sutures or staples will be removed at the office during your follow-up visit. 8. ACTIVITIES:  You may resume regular (light) daily activities beginning the next day--such as daily self-care, walking, climbing stairs--gradually increasing activities as tolerated.  You may have sexual intercourse when it is comfortable.  Refrain from any heavy lifting or straining until approved by your doctor.  a.You may drive when you are no longer taking prescription pain medication, you can comfortably wear a seatbelt, and you can safely maneuver your car and apply brakes. b.RETURN TO WORK:   _____________________________________________  9.You should see your doctor in the office for a follow-up appointment approximately 2-3 weeks after your surgery.  Make sure that you call for this appointment within a day or two after you arrive home to insure a convenient appointment time. 10.OTHER INSTRUCTIONS: _________________________    _____________________________________  WHEN TO CALL YOUR DOCTOR: Fever over 101.0 Inability to urinate Nausea and/or vomiting Extreme swelling or bruising Continued bleeding from incision. Increased pain, redness, or drainage from the incision  The clinic staff is available to answer your questions during regular business hours.  Please don't hesitate to call and ask to speak to one of the nurses for clinical concerns.  If you have a medical emergency, go to the nearest emergency room or call 911.  A surgeon from [facility] is always on call at the hospital   [address], [address],   [place], [place]  [address] ?  [address] [address], [place], [place]   [address] [phone] ? [phone] ? FAX [phone] Web site: [email or web address]  

## 2020-12-26 ENCOUNTER — Other Ambulatory Visit: Payer: Self-pay

## 2020-12-26 DIAGNOSIS — Z20822 Contact with and (suspected) exposure to covid-19: Secondary | ICD-10-CM | POA: Diagnosis present

## 2020-12-26 DIAGNOSIS — Z882 Allergy status to sulfonamides status: Secondary | ICD-10-CM | POA: Diagnosis not present

## 2020-12-26 DIAGNOSIS — K432 Incisional hernia without obstruction or gangrene: Secondary | ICD-10-CM | POA: Diagnosis present

## 2020-12-26 DIAGNOSIS — E039 Hypothyroidism, unspecified: Secondary | ICD-10-CM | POA: Diagnosis present

## 2020-12-26 DIAGNOSIS — Z7951 Long term (current) use of inhaled steroids: Secondary | ICD-10-CM | POA: Diagnosis not present

## 2020-12-26 DIAGNOSIS — F32A Depression, unspecified: Secondary | ICD-10-CM | POA: Diagnosis present

## 2020-12-26 DIAGNOSIS — Z79890 Hormone replacement therapy (postmenopausal): Secondary | ICD-10-CM | POA: Diagnosis not present

## 2020-12-26 DIAGNOSIS — M1990 Unspecified osteoarthritis, unspecified site: Secondary | ICD-10-CM | POA: Diagnosis present

## 2020-12-26 DIAGNOSIS — F419 Anxiety disorder, unspecified: Secondary | ICD-10-CM | POA: Diagnosis present

## 2020-12-26 DIAGNOSIS — Z90722 Acquired absence of ovaries, bilateral: Secondary | ICD-10-CM | POA: Diagnosis not present

## 2020-12-26 DIAGNOSIS — Z9049 Acquired absence of other specified parts of digestive tract: Secondary | ICD-10-CM | POA: Diagnosis not present

## 2020-12-26 DIAGNOSIS — Z888 Allergy status to other drugs, medicaments and biological substances status: Secondary | ICD-10-CM | POA: Diagnosis not present

## 2020-12-26 DIAGNOSIS — G4730 Sleep apnea, unspecified: Secondary | ICD-10-CM | POA: Diagnosis present

## 2020-12-26 DIAGNOSIS — Z79891 Long term (current) use of opiate analgesic: Secondary | ICD-10-CM | POA: Diagnosis not present

## 2020-12-26 DIAGNOSIS — K219 Gastro-esophageal reflux disease without esophagitis: Secondary | ICD-10-CM | POA: Diagnosis present

## 2020-12-26 DIAGNOSIS — Z9079 Acquired absence of other genital organ(s): Secondary | ICD-10-CM | POA: Diagnosis not present

## 2020-12-26 DIAGNOSIS — Z90710 Acquired absence of both cervix and uterus: Secondary | ICD-10-CM | POA: Diagnosis not present

## 2020-12-26 DIAGNOSIS — Z8543 Personal history of malignant neoplasm of ovary: Secondary | ICD-10-CM | POA: Diagnosis not present

## 2020-12-26 DIAGNOSIS — Z79899 Other long term (current) drug therapy: Secondary | ICD-10-CM | POA: Diagnosis not present

## 2020-12-26 NOTE — Progress Notes (Signed)
0900- Lying in bed with eyes closed. Easily aroused when name called. Lungs clear bilaterally. I.S., cough, and deep breathing encouraged. Bowel sounds hypoactive x4 quadrants. Lap sites x12 intact with dermabond. Abdominal binder in placed. #20g IV asymptomatic in RAC with IVF D51/2NC with [facility] @ 152m/hr. No complaints voiced at this time. Call bell within reach.  0959- Medicated with dilaudid '1mg'$  ivp for 6/10 abdominal pain. Significant other present at bedside.  1100- Pt ambulating with Ross (significant other).  1415- Medicated with oxycodone '10mg'$  po for 6/10 abdominal pain.  1800- Uneventful shift. Pt tolerating po well. Will continue to medicate to control pain. Call bell within reach.

## 2020-12-26 NOTE — Evaluation (Signed)
Physical Therapy Evaluation-1x Patient Details Name: [name] MRN: [id number] DOB: 09/23/1970 Today's Date: 12/26/2020   History of Present Illness  50 yo female admitted with incisional hernia s/p repair with mesh 12/25/20. Hx ovarian cancer  Clinical Impression  On eval, pt was Supv level for mobility. She walked ~175 feet while pushing IV pole. No LOB. Ambulation distance limited by pain primarily. Pain rated 6/10 (pre-medicated prior to session). Recommend ambulation in hallway with nursing or family supervision as tolerated. Do not anticipate any f/u PT needs. 1x eval. Will sign off.     Follow Up Recommendations No PT follow up;Supervision - Intermittent    Equipment Recommendations  3in1    Recommendations for Other Services       Precautions / Restrictions Precautions Precaution Comments: abd surgery Required Braces or Orthoses:  (abd binder) Restrictions Weight Bearing Restrictions: No      Mobility  Bed Mobility Overal bed mobility: Needs Assistance Bed Mobility: Supine to Sit     Supine to sit: Modified independent (Device/Increase time);HOB elevated     General bed mobility comments: Increased time. Pt relied on bedrail.    Transfers Overall transfer level: Needs assistance   Transfers: Sit to/from Stand Sit to Stand: Supervision         General transfer comment: Supv for safety. No assistance required.  Ambulation/Gait Ambulation/Gait assistance: Supervision Gait Distance (Feet): 175 Feet Assistive device: IV Pole Gait Pattern/deviations: Step-through pattern;Decreased stride length     General Gait Details: Supv for safety. Slow but steady gait. Pt pushed IV pole. Intermittent brief standing rest breaks during walk.  Stairs            Wheelchair Mobility    Modified Rankin (Stroke Patients Only)       Balance Overall balance assessment: Mild deficits observed, not formally tested                                            Pertinent Vitals/Pain Pain Assessment: 0-10 Pain Score: 6  Pain Location: abdomen Pain Descriptors / Indicators: Discomfort;Sore;Tightness;Grimacing Pain Intervention(s): Limited activity within patient's tolerance;Monitored during session;Repositioned    Home Living Family/patient expects to be discharged to:: Private residence Living Arrangements: Spouse/significant other Available Help at Discharge: Family Type of Home: House         Home Equipment:  (lift chair-pt plans to sleep in it once back home)      Prior Function Level of Independence: Independent               Hand Dominance        Extremity/Trunk Assessment   Upper Extremity Assessment Upper Extremity Assessment: Overall WFL for tasks assessed    Lower Extremity Assessment Lower Extremity Assessment: Overall WFL for tasks assessed    Cervical / Trunk Assessment Cervical / Trunk Assessment: Normal  Communication   Communication: No difficulties  Cognition Arousal/Alertness: Awake/alert Behavior During Therapy: WFL for tasks assessed/performed Overall Cognitive Status: Within Functional Limits for tasks assessed                                        General Comments      Exercises     Assessment/Plan    PT Assessment Patent does not need any further PT services (Recommend ambulation with nursing  or family supervision as tolerated)  PT Problem List Pain;Decreased activity tolerance;Decreased mobility       PT Treatment Interventions      PT Goals (Current goals can be found in the Care Plan section)  Acute Rehab PT Goals Patient Stated Goal: less pain. Eventually get back to physical activity (5K) PT Goal Formulation: All assessment and education complete, DC therapy    Frequency     Barriers to discharge        Co-evaluation               AM-PAC PT "6 Clicks" Mobility  Outcome Measure Help needed turning from your back to your side  while in a flat bed without using bedrails?: None Help needed moving from lying on your back to sitting on the side of a flat bed without using bedrails?: A Little Help needed moving to and from a bed to a chair (including a wheelchair)?: None Help needed standing up from a chair using your arms (e.g., wheelchair or bedside chair)?: None Help needed to walk in hospital room?: A Little Help needed climbing 3-5 steps with a railing? : A Little 6 Click Score: 21    End of Session   Activity Tolerance: Patient limited by pain Patient left: with family/visitor present;with call bell/phone within reach (in bathroom-husband to assist out as needed)   PT Visit Diagnosis: Other abnormalities of gait and mobility (R26.89);Pain Pain - part of body:  (abdomen)    Time: 1053-1110 PT Time Calculation (min) (ACUTE ONLY): 17 min   Charges:   PT Evaluation $PT Eval Low Complexity: [place], PT Acute Rehabilitation  Office: [phone] Pager: [phone]

## 2020-12-26 NOTE — Progress Notes (Signed)
PT Cancellation Note  Patient Details Name: [name] MRN: [id number] DOB: 14-Jul-1970   Cancelled Treatment:    Reason Eval/Treat Not Completed: Pain limiting ability to participate. Attempted PT eval-pt stated she would like pain medicine before working with PT-sent secure chat to RN- (pt stated she had already called out to desk to ask for pain medicine as well). Will check back as schedule allows.    [place] [facility]  Office: [phone] Pager: [phone]

## 2020-12-26 NOTE — Progress Notes (Signed)
1 Day Post-Op   Subjective/Chief Complaint: Complains of pain. Not passing any flatus   Objective: Vital signs in last 24 hours: Temp:  [97.4 F (36.3 C)-99 F (37.2 C)] 97.4 F (36.3 C) (08/20 1013) Pulse Rate:  [51-86] 69 (08/20 1013) Resp:  [11-23] 15 (08/20 0417) BP: (101-158)/(58-87) 116/58 (08/20 1013) SpO2:  [92 %-100 %] 96 % (08/20 1013) Last BM Date: 12/25/20  Intake/Output from previous day: 08/19 0701 - 08/20 0700 In: 3498 [P.O.:600; I.V.:2798; IV Piggyback:100] Out: 10 [Blood:10] Intake/Output this shift: No intake/output data recorded.  General appearance: alert and cooperative Resp: clear to auscultation bilaterally Cardio: regular rate and rhythm GI: soft, appropriately tender. Incisions look good  Lab Results:  Recent Labs    12/25/20 1755  WBC 17.8*  HGB 14.2  HCT 43.2  PLT 249   BMET Recent Labs    12/25/20 1755  CREATININE 0.88   PT/INR No results for input(s): LABPROT, INR in the last 72 hours. ABG No results for input(s): PHART, HCO3 in the last 72 hours.  Invalid input(s): PCO2, PO2  Studies/Results: No results found.  Anti-infectives: Anti-infectives (From admission, onward)    Start     Dose/Rate Route Frequency Ordered Stop   12/25/20 0923  ceFAZolin (ANCEF) 2-4 GM/100ML-% IVPB  Status:  Discontinued       Note to Pharmacy: Kyra Leyland   : cabinet override      12/25/20 0923 12/25/20 0932   12/25/20 0915  ceFAZolin (ANCEF) IVPB 3g/100 mL premix        3 g 200 mL/hr over 30 Minutes Intravenous On call to O.R. 12/25/20 0910 12/25/20 1212       Assessment/Plan: s/p Procedure(s): LAPAROSCOPIC INCISIONAL HERNIA REPAIR WITH MESH (N/A) Continue clears until bowel function returns Postop ileus Ambulate POD 1  LOS: 0 days    [name] III 12/26/2020

## 2020-12-27 ENCOUNTER — Encounter (HOSPITAL_COMMUNITY): Payer: Self-pay | Admitting: General Surgery

## 2020-12-27 MED ORDER — DOCUSATE SODIUM 100 MG PO CAPS
100.0000 mg | ORAL_CAPSULE | Freq: Two times a day (BID) | ORAL | Status: DC
  Administered 2020-12-27 – 2020-12-29 (×5): 100 mg via ORAL
  Filled 2020-12-27 (×5): qty 1

## 2020-12-27 MED ORDER — METHOCARBAMOL 1000 MG/10ML IJ SOLN
500.0000 mg | Freq: Four times a day (QID) | INTRAVENOUS | Status: DC | PRN
  Filled 2020-12-27: qty 5

## 2020-12-27 NOTE — Progress Notes (Signed)
2 Days Post-Op   Subjective/Chief Complaint: Still complains of pain when she moves. No flatus or bm yet   Objective: Vital signs in last 24 hours: Temp:  [97.4 F (36.3 C)-98.2 F (36.8 C)] 97.7 F (36.5 C) (08/21 0641) Pulse Rate:  [62-69] 62 (08/21 0641) Resp:  [17-18] 18 (08/21 0641) BP: (116-141)/(58-82) 136/73 (08/21 0641) SpO2:  [96 %-99 %] 96 % (08/21 0822) Last BM Date: 12/25/20  Intake/Output from previous day: 08/20 0701 - 08/21 0700 In: 1993.7 [P.O.:830; I.V.:1163.7] Out: 0  Intake/Output this shift: No intake/output data recorded.  General appearance: alert and cooperative Resp: clear to auscultation bilaterally Cardio: regular rate and rhythm GI: soft, moderate tenderness. Few bs  Lab Results:  Recent Labs    12/25/20 1755  WBC 17.8*  HGB 14.2  HCT 43.2  PLT 249   BMET Recent Labs    12/25/20 1755  CREATININE 0.88   PT/INR No results for input(s): LABPROT, INR in the last 72 hours. ABG No results for input(s): PHART, HCO3 in the last 72 hours.  Invalid input(s): PCO2, PO2  Studies/Results: No results found.  Anti-infectives: Anti-infectives (From admission, onward)    Start     Dose/Rate Route Frequency Ordered Stop   12/25/20 0923  ceFAZolin (ANCEF) 2-4 GM/100ML-% IVPB  Status:  Discontinued       Note to Pharmacy: [name]   : cabinet override      12/25/20 0923 12/25/20 0932   12/25/20 0915  ceFAZolin (ANCEF) IVPB 3g/100 mL premix        3 g 200 mL/hr over 30 Minutes Intravenous On call to O.R. 12/25/20 0910 12/25/20 1212       Assessment/Plan: s/p Procedure(s): LAPAROSCOPIC INCISIONAL HERNIA REPAIR WITH MESH (N/A) Advance diet Ambulate Postop ileus POD 2 Add robaxin for pain control  LOS: 1 day    [name] III 12/27/2020

## 2020-12-28 MED ORDER — IBUPROFEN 400 MG PO TABS
600.0000 mg | ORAL_TABLET | Freq: Three times a day (TID) | ORAL | Status: DC
  Administered 2020-12-28 – 2020-12-29 (×3): 600 mg via ORAL
  Filled 2020-12-28 (×3): qty 1

## 2020-12-28 MED ORDER — HYDROMORPHONE HCL 1 MG/ML IJ SOLN: 0.5000 mg | INTRAMUSCULAR | Status: DC | PRN

## 2020-12-28 MED ORDER — SENNOSIDES-DOCUSATE SODIUM 8.6-50 MG PO TABS: 2.0000 | ORAL_TABLET | Freq: Every evening | ORAL | Status: DC | PRN

## 2020-12-28 MED ORDER — METHOCARBAMOL 500 MG PO TABS
750.0000 mg | ORAL_TABLET | Freq: Three times a day (TID) | ORAL | Status: DC
  Administered 2020-12-28 – 2020-12-29 (×3): 750 mg via ORAL
  Filled 2020-12-28 (×3): qty 2

## 2020-12-28 NOTE — Progress Notes (Signed)
Spoke with pt and notified of results per Dr. Wert. Pt verbalized understanding and denied any questions. 

## 2020-12-28 NOTE — Progress Notes (Signed)
3 Days Post-Op   Subjective/Chief Complaint: Pain with moving, but trying to walk. Some flatus overnight.    Objective: Vital signs in last 24 hours: Temp:  [97.5 F (36.4 C)-99.3 F (37.4 C)] 97.5 F (36.4 C) (08/22 [id number]) Pulse Rate:  [67-73] 69 (08/22 0633) Resp:  [18] 18 (08/22 [id number]) BP: (116-138)/(71-76) 116/74 (08/22 [id number]) SpO2:  [97 %-99 %] 97 % (08/22 0633) Last BM Date: 12/25/20  Intake/Output from previous day: 08/21 0701 - 08/22 0700 In: 2368.6 [P.O.:1920; I.V.:448.6] Out: 0  Intake/Output this shift: No intake/output data recorded.  General appearance: alert and cooperative Resp: unlabored Cardio: regular rate and rhythm GI: soft, nondistended, appropriately tender. Incisions c/d/I no cellulitis or hematoma  Lab Results:  Recent Labs    12/25/20 1755  WBC 17.8*  HGB 14.2  HCT 43.2  PLT 249    BMET Recent Labs    12/25/20 1755  CREATININE 0.88    PT/INR No results for input(s): LABPROT, INR in the last 72 hours. ABG No results for input(s): PHART, HCO3 in the last 72 hours.  Invalid input(s): PCO2, PO2  Studies/Results: No results found.  Anti-infectives: Anti-infectives (From admission, onward)    Start     Dose/Rate Route Frequency Ordered Stop   12/25/20 0923  ceFAZolin (ANCEF) 2-4 GM/100ML-% IVPB  Status:  Discontinued       Note to Pharmacy: Kyra Leyland   : cabinet override      12/25/20 0923 12/25/20 0932   12/25/20 0915  ceFAZolin (ANCEF) IVPB 3g/100 mL premix        3 g 200 mL/hr over 30 Minutes Intravenous On call to O.R. 12/25/20 0910 12/25/20 1212       Assessment/Plan: s/p Procedure(s): LAPAROSCOPIC INCISIONAL HERNIA REPAIR WITH MESH (N/A) Continue mobilizing as able- pt recommending 3in1 Continue regular diet Recheck this afternoon for possible discharge  LOS: 2 days    Clovis Riley 12/28/2020

## 2020-12-28 NOTE — TOC Transition Note (Signed)
Transition of Care [facility]) - CM/SW Discharge Note  Patient Details  Name: [name] MRN: [id number] Date of Birth: May 27, 1970  [facility]) CM/SW Contact:  Sherie Don, LCSW Phone Number: 12/28/2020, 11:38 AM  Clinical Narrative: PT recommended 3N1. CSW met with patient to discuss DME recommendations. Patient is agreeable to a 3N1 and reported she needs the rolling walker to walk longer distances as she cannot without the walker. DME referral made to [facility] with [employer]. [employer] to deliver DME to patient's room. TOC signing off.  Final next level of care: Home/Self Care Barriers to Discharge: No Barriers Identified  Patient Goals and CMS Choice Patient states their goals for this hospitalization and ongoing recovery are:: Discharge home with DME CMS Medicare.gov Compare Post Acute Care list provided to:: Patient Choice offered to / list presented to : Patient  Discharge Plan and Services       DME Arranged: 3-N-1, Gilford Rile DME Agency: [employer] Date DME Agency Contacted: 12/28/20 Time DME Agency Contacted: [phone] Representative spoke with at DME Agency: Freda Munro  Readmission Risk Interventions No flowsheet data found.

## 2020-12-28 NOTE — Progress Notes (Signed)
Mobility Specialist - Progress Note    12/28/20 1302  Mobility  Activity Ambulated in room;Ambulated in hall  Level of Assistance Modified independent, requires aide device or extra time  Assistive Device Front wheel walker  Distance Ambulated (ft) 500 ft  Mobility Ambulated independently in hallway;Ambulated independently in room  Mobility Response Tolerated well  Mobility performed by Mobility specialist  $Mobility charge 1 Mobility   Pt ambulated 500 ft in hallway using RW and stated feeling sore in her abdominal region. No other reports of dizziness, SOB, or pain were stated during session. Upon returning to room, pt used bathroom. Prior to going back to bed, pt did put abdominal binder back on. Pt was left in room with call bell at side and family in room.   [place] Specialist Acute Rehabilitation Services Phone: [phone] 12/28/20, 1:05 PM

## 2020-12-29 HISTORY — PX: UMBILICAL HERNIA REPAIR: SHX196

## 2020-12-29 LAB — SARS CORONAVIRUS 2 (TAT 6-24 HRS): SARS Coronavirus 2: NEGATIVE

## 2020-12-29 MED ORDER — GUAIFENESIN-DM 100-10 MG/5ML PO SYRP
5.0000 mL | ORAL_SOLUTION | ORAL | Status: DC | PRN
  Administered 2020-12-29: 5 mL via ORAL
  Filled 2020-12-29: qty 10

## 2020-12-29 MED ORDER — BISACODYL 10 MG RE SUPP
10.0000 mg | Freq: Once | RECTAL | Status: AC
  Administered 2020-12-29: 10 mg via RECTAL
  Filled 2020-12-29: qty 1

## 2020-12-29 NOTE — Discharge Summary (Signed)
Physician Discharge Summary  Patient ID: [name] MRN: [id number] DOB/AGE: [birth year] 50 y.o.  Admit date: 12/25/2020 Discharge date: 12/29/2020  Admission Diagnoses: Incisional hernia  Discharge Diagnoses:  Active Problems:   Incisional hernia   Discharged Condition: good  Hospital Course: She was admitted for routine postoperative care following laparoscopic incisional hernia repair with mesh.  She was able to advance her diet and increase her mobility as well as improved pain control with oral medications over the course of the ensuing days.  On 12/29/2020 she was deemed stable for discharge.  Consults: None  Significant Diagnostic Studies: See epic  Treatments: Surgery as above  Discharge Exam: Blood pressure 123/65, pulse 61, temperature 98 F (36.7 C), resp. rate 18, height '5\' 7"'$  (1.702 m), weight (!) 139.7 kg, last menstrual period 11/02/2014, SpO2 99 %. General appearance: alert and cooperative Resp: Unlabored Cardio: regular rate and rhythm GI: Soft, obese, nondistended.  Appropriately tender.  Incisions clean dry and intact without cellulitis or hematoma. Extremities: extremities normal, atraumatic, no cyanosis or edema  Disposition: Discharge disposition: 01-Home or Self Care        Allergies as of 12/29/2020       Reactions   Moxifloxacin Anaphylaxis   needs epinephrine shot   Quinolones Anaphylaxis, Shortness Of Breath, Swelling   Doxycycline Nausea And Vomiting   Escitalopram Oxalate Other (See Comments)    fatigue   Lisinopril Cough   Sulfonamide Derivatives Swelling, Rash   needs epinephrine shot--rash and lip swelling        Medication List     TAKE these medications    acetaminophen 650 MG CR tablet Commonly known as: TYLENOL Take 1,300 mg by mouth daily as needed for pain.   ALPRAZolam 1 MG tablet Commonly known as: XANAX Take 1 tablet (1 mg total) by mouth 2 (two) times daily as needed for anxiety.   benzonatate 100 MG  capsule Commonly known as: Tessalon Perles 1 - 2 tabs by mouth three times daily as needed for cough   cyclobenzaprine 5 MG tablet Commonly known as: FLEXERIL Take 1 tablet (5 mg total) by mouth at bedtime as needed for muscle spasms.   estradiol 0.1 MG/24HR patch Commonly known as: VIVELLE-DOT Place 1 patch (0.1 mg total) onto the skin 2 (two) times a week.   fluticasone 50 MCG/ACT nasal spray Commonly known as: FLONASE Place 2 sprays into both nostrils daily. In each nostril   Fluticasone-Salmeterol 250-50 MCG/DOSE Aepb Commonly known as: Advair Diskus Inhale 1 puff into the lungs 2 (two) times daily.   gabapentin 600 MG tablet Commonly known as: NEURONTIN Take 1 tablet (600 mg total) by mouth 3 (three) times daily.   hydroxypropyl methylcellulose / hypromellose 2.5 % ophthalmic solution Commonly known as: ISOPTO TEARS / GONIOVISC Place 1 drop into both eyes daily as needed for dry eyes.   ibuprofen 800 MG tablet Commonly known as: ADVIL Take 1 tablet (800 mg total) by mouth every 8 (eight) hours as needed. What changed:  medication strength how much to take when to take this reasons to take this   levothyroxine 75 MCG tablet Commonly known as: SYNTHROID TAKE 1 TABLET BY MOUTH EVERY DAY What changed: when to take this   losartan 25 MG tablet Commonly known as: COZAAR Take 1 tablet (25 mg total) by mouth daily.   omeprazole 20 MG capsule Commonly known as: PRILOSEC Take 20 mg by mouth daily.   Omron 3 Series BP Monitor Devi Use as directed.   oxyCODONE  5 MG immediate release tablet Commonly known as: Oxy IR/ROXICODONE Take 1 tablet (5 mg total) by mouth every 6 (six) hours as needed for up to 30 doses for severe pain. 1-2 Tabs PO q6h PRN pain   predniSONE 5 MG tablet Commonly known as: DELTASONE Take 2 tablets (10 mg total) by mouth daily with breakfast.   sertraline 100 MG tablet Commonly known as: ZOLOFT TAKE 2 TABLETS (200 MG TOTAL) BY MOUTH  DAILY. What changed:  how much to take when to take this   sodium chloride 0.65 % Soln nasal spray Commonly known as: OCEAN Place 1 spray into both nostrils daily as needed for congestion.   traZODone 50 MG tablet Commonly known as: DESYREL Take 0.5-1 tablets (25-50 mg total) by mouth at bedtime as needed for sleep. What changed:  how much to take when to take this               Durable Medical Equipment  (From admission, onward)           Start     Ordered   12/28/20 1130  For home use only DME 3 n 1  Once        12/28/20 1130   12/28/20 1130  For home use only DME Walker rolling  Once       Comments: To help patient transfer and ambulate.  Physical / Occupational Therapy may change type of walker PRN.  Question Answer Comment  Walker: With [place]   Patient needs a walker to treat with the following condition Post-operative state      12/28/20 1130            Follow-up Information     Kinsinger, Arta Bruce, MD Follow up in 3 week(s).   Specialty: General Surgery Contact information: [place] [address] [phone]                 Signed: Clovis Riley 12/29/2020, 10:29 AM

## 2020-12-29 NOTE — Plan of Care (Signed)

## 2020-12-30 ENCOUNTER — Encounter: Payer: Self-pay | Admitting: Oncology

## 2020-12-31 ENCOUNTER — Other Ambulatory Visit: Payer: Self-pay | Admitting: *Deleted

## 2020-12-31 NOTE — Patient Outreach (Signed)
[facility]) Care Management  12/31/2020  Shalaine Frymoyer 08/09/1970 [id number]   Transition of care telephone call  Referral received:12/28/20 Initial outreach:12/31/20 Insurance: [facility]  Initial unsuccessful telephone call to patient's preferred number in order to complete transition of care assessment; no answer, left HIPAA compliant voicemail message requesting return call.   Objective: Per the electronic medical record, Falon Kendal   was hospitalized at [facility] 8/19-8/.23/22 for Laparoscopic incisional hernia repair.  Comorbidities include: Anxiety, Ovarian cancer, Arthritis. She was discharged to home on 12/29/20 without the need for home health services and with DME of 3 in 1 and rolling walker per discharge summary.   Plan: This RNCM will route unsuccessful outreach letter with [place] Management pamphlet and 24 hour Nurse Advice Line Magnet to [place] Management clinical pool to be mailed to patient's home address. This RNCM will attempt another outreach within 4 business days.   Joylene Draft, RN, BSN  [place] Management Coordinator  [phone]- Mobile [phone]- Toll Free Main Office

## 2021-01-04 ENCOUNTER — Other Ambulatory Visit: Payer: Self-pay | Admitting: *Deleted

## 2021-01-04 ENCOUNTER — Encounter: Payer: Self-pay | Admitting: *Deleted

## 2021-01-04 DIAGNOSIS — G4733 Obstructive sleep apnea (adult) (pediatric): Secondary | ICD-10-CM | POA: Diagnosis not present

## 2021-01-04 NOTE — Patient Outreach (Signed)
[facility]) Care Management  01/04/2021  [name] [birth year] [id number]   Transition of care call/case closure      Transition of care call Referral received: 12/28/20 Initial outreach attempt: 12/31/20 Insurance: [employer]   During the time my work phone was out of service received in [employer] from [facility], patient had made outreach call to [facility] main office number , noting patient attempt to call me and receiving my voicemail message. Attempted call to patient preferred contact number with blocked personal phone number patient shared that her phone rejected no caller ID numbers.  Once my work phone services restored attempted ,  2nd unsuccessful telephone call to patient's preferred contact number in order to complete post hospital discharge transition of care assessment , no answer left HIPAA compliant voicemail message for return call.   Subjective: Incoming call from [facility]inet,  Explained purpose of call and completed transition of care assessment.  [name] states that she is recovering, feeling better than she did. She  denies post-operative problems, says surgical incisions are unremarkable, states surgical pain well managed with prescribed medications, tolerating diet, reports eating a light diet has soups on hand and protein drinks denies bowel or bladder problems. Patient reports tolerating mobility in the home, using rolling walker due to concerns with balance at times like when walking to mailbox.  She discussed not being able to rest in the bed yet, sleeping in a lift recliner chair.  Reviewed accessing the following [place] Benefits :  She does not have the hospital indemnity, she reports being on ADA having been at working with system less than a year, provided [place] benefit contact number for additional questions.  She uses a [facility] outpatient pharmacy at [facility] outpatient pharmacy     Objective:  [name]   was  hospitalized at [facility] 8/19-8/.23/22 for Laparoscopic incisional hernia repair.  Comorbidities include: Anxiety, Ovarian cancer, Arthritis. She was discharged to home on 12/29/20 without the need for home health services and with DME of 3 in 1 and rolling walker per discharge summary.     Assessment:  Patient voices good understanding of all discharge instructions.  See transition of care flowsheet for assessment details.   Plan:  Reviewed hospital discharge diagnosis of Laparoscopic incisional hernia repair  and discharge treatment plan using hospital discharge instructions, assessing medication adherence, reviewing problems requiring provider notification, and discussing the importance of follow up with surgeon, primary care provider and/or specialists as directed.  Reviewed [place] healthy lifestyle program information to receive discounted premium for  2023   Step 1: Get  your annual physical  Step 2: Complete your health assessment  Step 3:Identify your current health status and complete the corresponding action step between May 09, 2020 and January 07, 2021.    No ongoing care management needs identified so will close case to [place] Management services and route successful outreach letter with [place] Management pamphlet and 24 Hour Nurse Line Magnet to [place] Management clinical pool to be mailed to patient's home address.  Thanked patient for their services to [facility].   Joylene Draft, RN, BSN  [place] Management Coordinator  [phone]- Mobile [phone]- Toll Free Main Office

## 2021-01-07 ENCOUNTER — Other Ambulatory Visit (HOSPITAL_COMMUNITY): Payer: Self-pay

## 2021-01-07 MED ORDER — TRAMADOL HCL 50 MG PO TABS
50.0000 mg | ORAL_TABLET | Freq: Four times a day (QID) | ORAL | 0 refills | Status: DC | PRN
  Filled 2021-01-07: qty 20, 5d supply, fill #0

## 2021-01-07 MED ORDER — FAMOTIDINE 20 MG PO TABS
20.0000 mg | ORAL_TABLET | Freq: Two times a day (BID) | ORAL | 11 refills | Status: DC
  Filled 2021-01-07 – 2021-02-16 (×2): qty 60, 30d supply, fill #0
  Filled 2021-04-22: qty 60, 30d supply, fill #1
  Filled 2021-07-07: qty 60, 30d supply, fill #2
  Filled 2021-08-02: qty 60, 30d supply, fill #3

## 2021-01-08 ENCOUNTER — Other Ambulatory Visit (HOSPITAL_COMMUNITY): Payer: Self-pay

## 2021-01-13 ENCOUNTER — Ambulatory Visit: Payer: 59 | Admitting: Pulmonary Disease

## 2021-01-13 NOTE — Telephone Encounter (Signed)
Procedure completed

## 2021-01-14 ENCOUNTER — Other Ambulatory Visit (HOSPITAL_COMMUNITY): Payer: Self-pay

## 2021-01-15 ENCOUNTER — Other Ambulatory Visit (HOSPITAL_COMMUNITY): Payer: Self-pay

## 2021-01-19 ENCOUNTER — Other Ambulatory Visit (HOSPITAL_BASED_OUTPATIENT_CLINIC_OR_DEPARTMENT_OTHER): Payer: Self-pay

## 2021-01-20 NOTE — Progress Notes (Signed)
Office Visit Note  Patient: [name]             Date of Birth: 1970/12/09           MRN: [id number]             PCP: Biagio Borg, MD Referring: Biagio Borg, MD Visit Date: 02/02/2021 Occupation: _0 @  Subjective:  Joint pain.   History of Present Illness: [name] is a 50 y.o. female with a history of sarcoidosis based on hilar lymphadenopathy, arthritis and subcutaneous nodules.  She had recent biopsy by Dr. Elsworth Soho which was negative for granulomatosis.  She continues to have joint pain in her right hip, both knees and her left ankle.  She denies any joint swelling.  She denies any shortness of breath.  She states she was a started on methotrexate by Dr. Elsworth Soho 1 week ago.  She was also placed on prednisone 5 mg p.o. daily as a bridging therapy.  She has not noticed any improvement in her symptoms.  She experienced headache and nausea with the oral methotrexate.  She did not notice any recent nodulosis on her skin due to EN lesions.  Activities of Daily Living:  Patient reports morning stiffness for all day. Patient Reports nocturnal pain.  Difficulty dressing/grooming: Denies Difficulty climbing stairs: Reports Difficulty getting out of chair: Reports Difficulty using hands for taps, buttons, cutlery, and/or writing: Reports  Review of Systems  Constitutional:  Positive for fatigue.  HENT:  Positive for mouth dryness and nose dryness. Negative for mouth sores.   Eyes:  Positive for pain, itching and dryness.  Respiratory:  Negative for shortness of breath and difficulty breathing.   Cardiovascular:  Negative for chest pain and palpitations.  Gastrointestinal:  Negative for blood in stool, constipation and diarrhea.  Endocrine: Negative for increased urination.  Genitourinary:  Negative for difficulty urinating.  Musculoskeletal:  Positive for joint pain, joint pain, joint swelling, myalgias, morning stiffness, muscle tenderness and myalgias.  Skin:  Positive  for rash. Negative for color change and sensitivity to sunlight.  Allergic/Immunologic: Negative for susceptible to infections.  Neurological:  Positive for dizziness, numbness and headaches. Negative for memory loss.  Hematological:  Positive for bruising/bleeding tendency.  Psychiatric/Behavioral:  Negative for depressed mood, confusion and sleep disturbance. The patient is not nervous/anxious.    PMFS History:  Patient Active Problem List   Diagnosis Date Noted   Incisional hernia 12/25/2020   Mediastinal lymphadenopathy    Pulmonary nodules 10/20/2020   Multiple skin nodules 07/24/2020   Viral illness 06/14/2020   Right lumbar radiculopathy 67/89/3810   Periumbilical hernia 17/51/0258   TB lung, latent 05/18/2020   Acute non-recurrent maxillary sinusitis 11/09/2019   HTN (hypertension) 04/14/2019   Mass of left wrist 04/14/2019   External otitis of right ear 04/14/2019   Choledocholithiasis    Abnormal cholangiogram    Cholecystitis with cholelithiasis 07/24/2018   Hyperglycemia 10/16/2017   Hot flash not due to menopause 10/16/2017   Eczema 10/16/2017   Flu-like symptoms 06/20/2017   Cognitive changes 01/30/2017   Disturbed concentration 01/26/2017   Left shoulder pain 11/01/2016   Right low back pain 11/01/2016   Elevated blood pressure, situational 05/05/2016   Surgical menopause 01/29/2016   OSA on CPAP 12/22/2015   Cough 11/26/2015   Capsulitis 10/06/2015   Ganglion cyst of left foot 10/06/2015   Fatigue 09/13/2015   Asthma 09/08/2015   Exertional dyspnea 09/08/2015   Peroneal ganglion cyst 07/20/2015   Low back  pain 07/20/2015   Peroneal tendinitis of left lower leg 06/25/2015   Nonallopathic lesion of cervical region 06/25/2015   Wheezing 05/08/2015   Polyarthralgia 05/08/2015   Peripheral edema 03/16/2015   Central line complication 10/62/6948   Morbid obesity with BMI of 45.0-49.9, adult ([place]) 02/28/2015   Chemotherapy induced neutropenia ([place])  02/28/2015   Genetic testing 01/05/2015   Chemotherapy induced nausea and vomiting 01/02/2015   Chemotherapy-induced peripheral neuropathy ([place]) 01/02/2015   Premature surgical menopause 12/17/2014   Leukopenia due to antineoplastic chemotherapy ([place]) 12/17/2014   Port-A-Cath in place 12/17/2014   Encounter for antineoplastic chemotherapy 12/17/2014   Myalgia 12/09/2014   Hypersensitivity reaction 12/05/2014   Poor venous access 11/28/2014   Postoperative cellulitis of surgical wound 11/24/2014   Tobacco dependence 11/12/2014   Malignant neoplasm of both ovaries 11/10/2014   Anxiety 11/10/2014   Adnexal mass 11/10/2014   Encounter for well adult exam with abnormal findings 09/10/2014   Hypothyroidism 09/10/2014   Hypersomnolence 09/10/2014   Acute non-recurrent frontal sinusitis 06/24/2014   Right knee pain 06/24/2014   Lower back pain 01/08/2014   EAR PAIN, BILATERAL 12/18/2009   KNEE PAIN, RIGHT 12/18/2009   INGROWN TOENAIL 12/17/2008   BACK PAIN 12/17/2008   Acute bronchospasm 11/11/2008   Depression 11/29/2007   Insomnia 11/29/2007   Allergic rhinitis 04/13/2007   GERD 04/13/2007   Endometriosis determined by laparoscopy 04/13/2007   Migraine headache 01/29/2007    Past Medical History:  Diagnosis Date   Anxiety    Arthritis    Depression    Dyspnea    Elevated blood pressure, situational 05/05/2016   Endometriosis    Exercise-induced asthma 09/08/2015   GERD (gastroesophageal reflux disease)    History of kidney stones    Hypothyroidism    Migraine    Ovarian cancer, bilateral 11/28/2014   PONV (postoperative nausea and vomiting)    Sleep apnea    Thyroid disease    Umbilical hernia    woke up while porta cath removed     Family History  Problem Relation Age of Onset   Hypertension Mother    Diabetes Mother    Stroke Mother        Deceased   Heart disease Mother    Osteoarthritis Mother    Atrial fibrillation Father        Living   Stroke Father         optic nerve left eye   Healthy Brother    Diabetes Maternal Aunt    Heart failure Maternal Grandmother    Heart attack Maternal Grandfather    Dementia Paternal Grandmother    Dementia Paternal Grandfather    Past Surgical History:  Procedure Laterality Date   ABDOMINAL HYSTERECTOMY  11/10/2014   at [facility], Exp lap, supracervical hyst, BSO, infracolic omentectomy, lymphadenectomy, aortic lymph node sampling   BRONCHIAL NEEDLE ASPIRATION BIOPSY  12/16/2020   Procedure: BRONCHIAL NEEDLE ASPIRATION BIOPSIES;  Surgeon: Rigoberto Noel, MD;  Location: [facility];  Service: Cardiopulmonary;;   BRONCHIAL WASHINGS  12/16/2020   Procedure: BRONCHIAL WASHINGS;  Surgeon: Rigoberto Noel, MD;  Location: [facility];  Service: Cardiopulmonary;;   CHOLECYSTECTOMY N/A 07/24/2018   Procedure: LAPAROSCOPIC CHOLECYSTECTOMY WITH INTRAOPERATIVE CHOLANGIOGRAM;  Surgeon: Armandina Gemma, MD;  Location: [facility];  Service: General;  Laterality: N/A;   ERCP N/A 07/26/2018   Procedure: ENDOSCOPIC RETROGRADE CHOLANGIOPANCREATOGRAPHY (ERCP);  Surgeon: Ladene Artist, MD;  Location: [facility];  Service: Endoscopy;  Laterality: N/A;   GANGLION CYST EXCISION Right  hand   INCISIONAL HERNIA REPAIR N/A 12/25/2020   Procedure: LAPAROSCOPIC INCISIONAL HERNIA REPAIR WITH MESH;  Surgeon: Kinsinger, Arta Bruce, MD;  Location: [facility];  Service: General;  Laterality: N/A;   IR GENERIC HISTORICAL  05/12/2016   IR REMOVAL TUN ACCESS W/ PORT W/O FL MOD SED 05/12/2016 [facility]   LIPOMA EXCISION Left    ankle   MENISCUS REPAIR Right    Porta a cath removal     PORTA CATH INSERTION     REMOVAL OF STONES  07/26/2018   Procedure: REMOVAL OF STONES;  Surgeon: Ladene Artist, MD;  Location: [facility];  Service: Endoscopy;;   SPHINCTEROTOMY  07/26/2018   Procedure: Joan Mayans;  Surgeon: Ladene Artist, MD;  Location: [facility];  Service: Endoscopy;;   VIDEO BRONCHOSCOPY WITH ENDOBRONCHIAL ULTRASOUND N/A  12/16/2020   Procedure: VIDEO BRONCHOSCOPY WITH ENDOBRONCHIAL ULTRASOUND;  Surgeon: Rigoberto Noel, MD;  Location: [facility];  Service: Cardiopulmonary;  Laterality: N/A;   Social History   Social History Narrative   Lives with boyfriend in a one story home.  Has no children.     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     Education: college.   Right handed    Immunization History  Administered Date(s) Administered   Influenza,inj,Quad PF,6+ Mos 01/02/2014, 03/26/2015, 12/25/2017, 01/21/2020, 01/25/2021   Influenza-Unspecified 02/23/2016, 02/09/2017, 02/12/2020   MMR 02/09/2017, 07/03/2017   PFIZER(Purple Top)SARS-COV-2 Vaccination 08/01/2019, 08/22/2019, 02/22/2020   Tdap 11/07/2017   Tetanus 12/21/2015     Objective: Vital Signs: BP 129/88 (BP Location: Left Wrist, Patient Position: Sitting, Cuff Size: Normal)   Pulse 80   Ht 5' 7" (1.702 m)   Wt (!) 320 lb (145.2 kg)   LMP 11/02/2014   BMI 50.12 kg/m    Physical Exam Vitals and nursing note reviewed.  Constitutional:      Appearance: She is well-developed.  HENT:     Head: Normocephalic and atraumatic.  Eyes:     Conjunctiva/sclera: Conjunctivae normal.  Cardiovascular:     Rate and Rhythm: Normal rate and regular rhythm.     Heart sounds: Normal heart sounds.  Pulmonary:     Effort: Pulmonary effort is normal.     Breath sounds: Normal breath sounds.  Abdominal:     General: Bowel sounds are normal.     Palpations: Abdomen is soft.  Musculoskeletal:     Cervical back: Normal range of motion.  Lymphadenopathy:     Cervical: No cervical adenopathy.  Skin:    General: Skin is warm and dry.     Capillary Refill: Capillary refill takes less than 2 seconds.  Neurological:     Mental Status: She is alert and oriented to person, place, and time.  Psychiatric:        Behavior: Behavior normal.     Musculoskeletal Exam: C-spine was in good range of motion.  Shoulder joints, elbow joints, wrist joints, MCPs PIPs and DIPs with  good range of motion.  She had no synovitis on examination.  She had pain and discomfort with range of motion of bilateral knee joints.  She had warmth on palpation of her right knee joint.  She had tenderness on palpation of her left ankle joint.  CDAI Exam: CDAI Score: -- Patient Global: --; Provider Global: -- Swollen: --; Tender: -- Joint Exam 02/02/2021   No joint exam has been documented for this visit   There is currently no information documented on the homunculus. Go to the Rheumatology activity and  complete the homunculus joint exam.  Investigation: No additional findings.  Imaging: No results found.  Recent Labs: Lab Results  Component Value Date   WBC 17.8 (H) 12/25/2020   HGB 14.2 12/25/2020   PLT 249 12/25/2020   NA 138 12/11/2020   K 3.5 12/11/2020   CL 103 12/11/2020   CO2 25 12/11/2020   GLUCOSE 90 12/11/2020   BUN 12 12/11/2020   CREATININE 0.88 12/25/2020   BILITOT 0.9 11/11/2020   ALKPHOS 56 06/15/2020   AST 15 11/11/2020   ALT 15 11/11/2020   PROT 7.0 11/11/2020   PROT 7.0 11/11/2020   ALBUMIN 4.3 06/15/2020   CALCIUM 9.3 12/11/2020   GFRAA 110 11/11/2020   QFTBGOLDPLUS NEGATIVE 11/11/2020    Speciality Comments: No specialty comments available.  Procedures:  No procedures performed Allergies: Moxifloxacin, Quinolones, Doxycycline, Escitalopram oxalate, Lisinopril, and Sulfonamide derivatives   Assessment / Plan:     Visit Diagnoses: Sarcoidosis - Progression of mediastinal and hilar lymph nodes on the CT scan which improved after prednisone.  She also gives history of EN lesions.  She had right parotid swelling.  Her lymph node biopsy was negative for granulomatosis.  She was started on prednisone 5 mg p.o. daily and methotrexate 4 tablets p.o. weekly by Dr. Elsworth Soho.  Patient has not noted any improvement as expected.  She has been experiencing nausea and headache with methotrexate.  Indications side effects contraindications of methotrexate were  discussed at length.  I discussed the option of subcutaneous methotrexate.  She wants to proceed with subcutaneous methotrexate.  The plan is to start on methotrexate 0.6 mL subcu weekly and then increase it to 0.8 mL subcu weekly if the labs are stable.  Also started on folic acid 2 mg p.o. daily once we get the results of hepatitis B and C.  Advised her to stop prednisone.  I will also give her prescription for Zofran 4 mg p.o. every 6 hours as needed nausea total 30 tablets with 1 refill.  Drug Counseling TB Gold: November 11, 2020 Hepatitis panel: Pending  HIV: 07/24/2018  Chest-xray: CT scan  Contraception: Status post hysterectomy and ovariectomy  Alcohol use: None  Patient was counseled on the purpose, proper use, and adverse effects of methotrexate including nausea, infection, and signs and symptoms of pneumonitis.  Reviewed instructions with patient to take methotrexate weekly along with folic acid daily.  Discussed the importance of frequent monitoring of kidney and liver function and blood counts, and provided patient with standing lab instructions.  Counseled patient to avoid NSAIDs and alcohol while on methotrexate.  Provided patient with educational materials on methotrexate and answered all questions.  Advised patient to get annual influenza vaccine and to get a pneumococcal vaccine if patient has not already had one.  Patient voiced understanding.  Patient consented to methotrexate use.  Will upload into chart.     Swelling of right parotid gland - most likely due to sarcoidosis.  ENT evaluation pending.  Positive ANA (antinuclear antibody) - Positive ANA, positive sicca symptoms and oral ulcers. AVISE lupus index -1.1, ANA 1: 160NS, antiphosphatidylserine IgM positive  Primary osteoarthritis of both knees-she complains of pain and discomfort in her knee joints.  Right knee joint was warm to touch.  Right lumbar radiculopathy  Malignant neoplasm of both ovaries - 11/09/2014 TAHBSO,  CTX X 6 months.  Patient states that she was released by her oncologist to start on immunosuppressive therapy.  Chemotherapy-induced peripheral neuropathy (HCC)  Primary hypertension  History of  gastroesophageal reflux (GERD)  OSA on CPAP  Mild intermittent asthma with acute exacerbation  Acquired hypothyroidism  Migraine without aura and with status migrainosus, not intractable  Orders: Orders Placed This Encounter  Procedures   Hepatitis B core antibody, IgM   Hepatitis B surface antigen   Hepatitis C antibody    No orders of the defined types were placed in this encounter.    Follow-Up Instructions: Return in about 6 weeks (around 03/16/2021) for Sarcoidosis.   Bo Merino, MD  Note - This record has been created using Editor, commissioning.  Chart creation errors have been sought, but may not always  have been located. Such creation errors do not reflect on  the standard of medical care.

## 2021-01-25 ENCOUNTER — Other Ambulatory Visit: Payer: Self-pay

## 2021-01-25 ENCOUNTER — Ambulatory Visit (INDEPENDENT_AMBULATORY_CARE_PROVIDER_SITE_OTHER): Payer: 59 | Admitting: Pulmonary Disease

## 2021-01-25 ENCOUNTER — Other Ambulatory Visit (HOSPITAL_BASED_OUTPATIENT_CLINIC_OR_DEPARTMENT_OTHER): Payer: Self-pay

## 2021-01-25 ENCOUNTER — Encounter: Payer: Self-pay | Admitting: Pulmonary Disease

## 2021-01-25 VITALS — BP 132/68 | HR 68 | Temp 98.3°F | Ht 67.0 in | Wt 318.4 lb

## 2021-01-25 DIAGNOSIS — Z23 Encounter for immunization: Secondary | ICD-10-CM

## 2021-01-25 DIAGNOSIS — R590 Localized enlarged lymph nodes: Secondary | ICD-10-CM

## 2021-01-25 DIAGNOSIS — Z9989 Dependence on other enabling machines and devices: Secondary | ICD-10-CM | POA: Diagnosis not present

## 2021-01-25 DIAGNOSIS — G4733 Obstructive sleep apnea (adult) (pediatric): Secondary | ICD-10-CM | POA: Diagnosis not present

## 2021-01-25 MED ORDER — METHOTREXATE 2.5 MG PO TABS
10.0000 mg | ORAL_TABLET | ORAL | 0 refills | Status: DC
  Filled 2021-01-25: qty 32, 56d supply, fill #0

## 2021-01-25 MED ORDER — PREDNISONE 10 MG PO TABS
5.0000 mg | ORAL_TABLET | Freq: Every day | ORAL | 0 refills | Status: AC
  Filled 2021-01-25: qty 7, 14d supply, fill #0

## 2021-01-25 NOTE — Patient Instructions (Signed)
Prednisone 5mg  daily x 2 weeks Methotrexate 10 mg once/ week  x 8 weeks Follow with dr Estanislado Pandy

## 2021-01-25 NOTE — Assessment & Plan Note (Signed)
CPAP download was reviewed which shows good control of events on auto CPAP 5 to 15 cm with average pressure of 11 to 12 cm.  She needs to improve her compliance and this was emphasized  Weight loss encouraged, compliance with goal of at least 4-6 hrs every night is the expectation. Advised against medications with sedative side effects Cautioned against driving when sleepy - understanding that sleepiness will vary on a day to day basis

## 2021-01-25 NOTE — Assessment & Plan Note (Signed)
We discussed that her conglomeration of mediastinal lymphadenopathy, transient subcutaneous nodules, mild arthritis is likely due to sarcoidosis even though lymph node TB NA was negative for granulomas.  She responded well to trial of steroids.  We discussed natural course of sarcoidosis and no treatment versus treatment with steroid or steroid sparing agents.  She prefers treatment since she was symptomatic.  I explained that lung function has been maintained. We decided to start her on methotrexate 10 mg/week and I gave her a prescription for 8 weeks.  She will reconnect with rheumatology in the meantime.  I will also give her a short course of prednisone 5 mg x 2 weeks for symptomatic relief

## 2021-01-25 NOTE — Addendum Note (Signed)
Encounter addended by: Annie Paras on: 01/25/2021 11:26 AM  Actions taken: Letter saved

## 2021-01-25 NOTE — Progress Notes (Signed)
   Subjective:    Patient ID: [name], female    DOB: [birth year], 50 y.o.   MRN: [id number]  HPI 50 year old never smoker, [employer] employee for follow-up of presumed sarcoidosis & OSA  She developed cough, shortness of breath since January 2022 with subcutaneous tender nodules and submandibular lymphadenopathy which resolved after course of prednisone  PMH -ovarian cancer 2016 status postchemotherapy , residual neuropathy OSA, diagnosed in 2017, maintained on CPAP Hypertension on lisinopril  We discussed positive ANA with rheumatology , she underwent EBUS/TB NA 4R/ 7  which did not show any evidence of cancer or granulomas.  Reviewed repeat CT 12/2020 , lymph nodes had gone down in size .  She received empiric 10 mg of steroids for 4 weeks starting 7/25  She felt considerably improved while she was on steroids and now feels like her breathing is slightly worse again.  Denies skin lesions or cough She underwent hernia surgery about a week after bronchoscopy   Significant tests/ events reviewed  ANA 1: 1280  HST  09/2015 AHI= 21 ( wt 297lbs )   PFTs 8/1 showed normal lung function CT chest w con 12/2020 >>Interval improvement in bilateral pulmonary nodules  CT chest w con 09/2020 that showed enlarged  mediastinal and hilar lymph nodes.  Along with multiple scattered pulmonary nodules.  CT chest with con 07/2019 >> stable 4 mm right middle lobe nodule compared to 07/2018 CT abdomen, 4 mm left upper lobe nodule  Past Medical History:  Diagnosis Date   Anxiety    Arthritis    Depression    Dyspnea    Elevated blood pressure, situational 05/05/2016   Endometriosis    Exercise-induced asthma 09/08/2015   GERD (gastroesophageal reflux disease)    History of kidney stones    Hypothyroidism    Migraine    Ovarian cancer, bilateral 11/28/2014   PONV (postoperative nausea and vomiting)    Sleep apnea    Thyroid disease    Umbilical hernia    woke up while porta cath removed        Review of Systems  neg for any significant sore throat, dysphagia, itching, sneezing, nasal congestion or excess/ purulent secretions, fever, chills, sweats, unintended wt loss, pleuritic or exertional cp, hempoptysis, orthopnea pnd or change in chronic leg swelling. Also denies presyncope, palpitations, heartburn, abdominal pain, nausea, vomiting, diarrhea or change in bowel or urinary habits, dysuria,hematuria, rash, arthralgias, visual complaints, headache, numbness weakness or ataxia.     Objective:   Physical Exam  Gen. Pleasant, obese, in no distress ENT - no lesions, no post nasal drip Neck: No JVD, no thyromegaly, no carotid bruits Lungs: no use of accessory muscles, no dullness to percussion, decreased without rales or rhonchi  Cardiovascular: Rhythm regular, heart sounds  normal, no murmurs or gallops, no peripheral edema Musculoskeletal: No deformities, no cyanosis or clubbing , no tremors       Assessment & Plan:

## 2021-01-26 ENCOUNTER — Other Ambulatory Visit (HOSPITAL_BASED_OUTPATIENT_CLINIC_OR_DEPARTMENT_OTHER): Payer: Self-pay

## 2021-01-29 ENCOUNTER — Telehealth: Payer: Self-pay | Admitting: Internal Medicine

## 2021-01-29 NOTE — Telephone Encounter (Signed)
Patient received letter in mailing regarding her imaging from 08.17.22 that said there were some abnormal findings & that she should contact her pcp  Please follow up w/ patient

## 2021-02-01 NOTE — Telephone Encounter (Signed)
Patient returning call from nurse  Says she spoke w/ office of provider who ordered imaging but they did not discuss tha abnormal finding other than what he was looking for on the imaging  Wants clarification on whether there is something else she needs to be concerned about

## 2021-02-01 NOTE — Telephone Encounter (Signed)
See below

## 2021-02-01 NOTE — Telephone Encounter (Signed)
Advised patient of message as written by provider.

## 2021-02-01 NOTE — Telephone Encounter (Signed)
I reveiwed the conclusion of the CT chest aug 2022 , ok to let pt know - there appear to me to be no other issue that is concerning or needs further evaluation

## 2021-02-01 NOTE — Telephone Encounter (Signed)
Unable to reach patient. Imaging that was done 12/23/20 was not from Dr. [name]. Patient should contact the office of Dr. Kara Mead V.d

## 2021-02-02 ENCOUNTER — Other Ambulatory Visit: Payer: Self-pay

## 2021-02-02 ENCOUNTER — Encounter: Payer: Self-pay | Admitting: Rheumatology

## 2021-02-02 ENCOUNTER — Ambulatory Visit (INDEPENDENT_AMBULATORY_CARE_PROVIDER_SITE_OTHER): Payer: 59 | Admitting: Rheumatology

## 2021-02-02 VITALS — BP 129/88 | HR 80 | Ht 67.0 in | Wt 320.0 lb

## 2021-02-02 DIAGNOSIS — R918 Other nonspecific abnormal finding of lung field: Secondary | ICD-10-CM

## 2021-02-02 DIAGNOSIS — E039 Hypothyroidism, unspecified: Secondary | ICD-10-CM

## 2021-02-02 DIAGNOSIS — Z79899 Other long term (current) drug therapy: Secondary | ICD-10-CM | POA: Diagnosis not present

## 2021-02-02 DIAGNOSIS — M170 Bilateral primary osteoarthritis of knee: Secondary | ICD-10-CM | POA: Diagnosis not present

## 2021-02-02 DIAGNOSIS — R600 Localized edema: Secondary | ICD-10-CM

## 2021-02-02 DIAGNOSIS — D869 Sarcoidosis, unspecified: Secondary | ICD-10-CM | POA: Diagnosis not present

## 2021-02-02 DIAGNOSIS — C563 Malignant neoplasm of bilateral ovaries: Secondary | ICD-10-CM | POA: Diagnosis not present

## 2021-02-02 DIAGNOSIS — M5416 Radiculopathy, lumbar region: Secondary | ICD-10-CM | POA: Diagnosis not present

## 2021-02-02 DIAGNOSIS — I10 Essential (primary) hypertension: Secondary | ICD-10-CM | POA: Diagnosis not present

## 2021-02-02 DIAGNOSIS — R768 Other specified abnormal immunological findings in serum: Secondary | ICD-10-CM

## 2021-02-02 DIAGNOSIS — R7689 Other specified abnormal immunological findings in serum: Secondary | ICD-10-CM

## 2021-02-02 DIAGNOSIS — Z9989 Dependence on other enabling machines and devices: Secondary | ICD-10-CM

## 2021-02-02 DIAGNOSIS — Z8719 Personal history of other diseases of the digestive system: Secondary | ICD-10-CM | POA: Diagnosis not present

## 2021-02-02 DIAGNOSIS — G620 Drug-induced polyneuropathy: Secondary | ICD-10-CM | POA: Diagnosis not present

## 2021-02-02 DIAGNOSIS — G4733 Obstructive sleep apnea (adult) (pediatric): Secondary | ICD-10-CM

## 2021-02-02 DIAGNOSIS — J4521 Mild intermittent asthma with (acute) exacerbation: Secondary | ICD-10-CM

## 2021-02-02 DIAGNOSIS — T451X5A Adverse effect of antineoplastic and immunosuppressive drugs, initial encounter: Secondary | ICD-10-CM

## 2021-02-02 DIAGNOSIS — G43001 Migraine without aura, not intractable, with status migrainosus: Secondary | ICD-10-CM

## 2021-02-02 NOTE — Telephone Encounter (Signed)
Pending hepatitis panel, patient will be starting on vial and syringe methotrexate per Dr. Estanislado Pandy.  Patient will need prescriptions for methotrexate vials, syringes, folic acid and zofran.   Thanks!  Consent obtained and sent to the scan center.

## 2021-02-02 NOTE — Patient Instructions (Addendum)
Standing Labs We placed an order today for your standing lab work.   Please have your standing labs drawn in 2 weeks x2, 2 months and then every 3 months.  If possible, please have your labs drawn 2 weeks prior to your appointment so that the provider can discuss your results at your appointment.  Please note that you may see your imaging and lab results in [facility] before we have reviewed them. We may be awaiting multiple results to interpret others before contacting you. Please allow our office up to 72 hours to thoroughly review all of the results before contacting the office for clarification of your results.  We have open lab daily: Monday through Thursday from 1:30-4:30 PM and Friday from 1:30-4:00 PM at the office of Dr. Bo Merino, [facility].   Please be advised, all patients with office appointments requiring lab work will take precedent over walk-in lab work.  If possible, please come for your lab work on Monday and Friday afternoons, as you may experience shorter wait times. The office is located at [address], [place], [place], [place] [address] No appointment is necessary.   Labs are drawn by [facility]. Please bring your co-pay at the time of your lab draw.  You may receive a bill from [facility] for your lab work.  If you wish to have your labs drawn at another location, please call the office 24 hours in advance to send orders.  If you have any questions regarding directions or hours of operation,  please call [phone].   As a reminder, please drink plenty of water prior to coming for your lab work. Thanks!  Methotrexate Injection What is this medication? METHOTREXATE (METH oh TREX ate) treats inflammatory conditions such as arthritis and psoriasis. It works by decreasing inflammation, which can reduce pain and prevent long-term injury to the joints and skin. It may also be used to treat some types of cancer. It works by slowing down the growth of cancer  cells. This medicine may be used for other purposes; ask your health care provider or pharmacist if you have questions. What should I tell my care team before I take this medication? They need to know if you have any of these conditions: Fluid in the stomach area or lungs If you often drink alcohol Infection or immune system problems Kidney disease Liver disease Low blood counts (white cells, platelets, or red blood cells) Lung disease Recent or ongoing radiation Recent or upcoming vaccine Stomach ulcers Ulcerative colitis An unusual or allergic reaction to methotrexate, other medications, foods, dyes, or preservatives Pregnant or trying to get pregnant Breast-feeding How should I use this medication? This medication is for infusion into a vein or for injection into muscle or into the spinal fluid (whichever applies). It is usually given in a hospital or clinic setting. In rare cases, you might get this medication at home. You will be taught how to give this medication. Use exactly as directed. Take your medication at regular intervals. Do not take your medication more often than directed. If this medication is used for arthritis or psoriasis, it should be taken weekly, NOT daily. It is important that you put your used needles and syringes in a special sharps container. Do not put them in a trash can. If you do not have a sharps container, call your pharmacist or care team to get one. Talk to your care team about the use of this medication in children. While this medication may be prescribed  for children as young as 2 years for selected conditions, precautions do apply. Overdosage: If you think you have taken too much of this medicine contact a poison control center or emergency room at once. NOTE: This medicine is only for you. Do not share this medicine with others. What if I miss a dose? It is important not to miss your dose. Call your care team if you are unable to keep an appointment.  If you give yourself the medication, and you miss a dose, talk with your care team. Do not take double or extra doses. What may interact with this medication? Do not take this medication with any of the following: Acitretin This medication may also interact with the following: Aspirin or aspirin-like medications including salicylates Azathioprine Certain antibiotics like chloramphenicol, penicillin, tetracycline Certain medications that treat or prevent blood clots like warfarin, apixaban, dabigatran, and rivaroxaban Certain medications for stomach problems like esomeprazole, omeprazole, pantoprazole Cyclosporine Dapsone Diuretics Folic acid Gold Hydroxychloroquine Live virus vaccines Medications for infection like acyclovir, adefovir, amphotericin B, bacitracin, cidofovir, foscarnet, ganciclovir, gentamicin, pentamidine, vancomycin Mercaptopurine NSAIDs, medications for pain and inflammation, like ibuprofen or naproxen Pamidronate Pemetrexed Penicillamine Phenylbutazone Phenytoin Probenecid Pyrimethamine Retinoids such as isotretinoin and tretinoin Steroid medications like prednisone or cortisone Sulfonamides like sulfasalazine and trimethoprim/sulfamethoxazole Theophylline Zoledronic acid This list may not describe all possible interactions. Give your health care provider a list of all the medicines, herbs, non-prescription drugs, or dietary supplements you use. Also tell them if you smoke, drink alcohol, or use illegal drugs. Some items may interact with your medicine. What should I watch for while using this medication? This medication may make you feel generally unwell. This is not uncommon as chemotherapy can affect healthy cells as well as cancer cells. Report any side effects. Continue your course of treatment even though you feel ill unless your care team tells you to stop. Your condition will be monitored carefully while you are receiving this medication. Avoid alcoholic  drinks. This medication can cause serious side effects. To reduce the risk, your care team may give you other medications to take before receiving this one. Be sure to follow the directions from your care team. This medication can make you more sensitive to the sun. Keep out of the sun. If you cannot avoid being in the sun, wear protective clothing and use sunscreen. Do not use sun lamps or tanning beds/booths. You may get drowsy or dizzy. Do not drive, use machinery, or do anything that needs mental alertness until you know how this medication affects you. Do not stand or sit up quickly, especially if you are an older patient. This reduces the risk of dizzy or fainting spells. You may need blood work while you are taking this medication. Call your care team for advice if you get a fever, chills or sore throat, or other symptoms of a cold or flu. Do not treat yourself. This medication decreases your body's ability to fight infections. Try to avoid being around people who are sick. This medication may increase your risk to bruise or bleed. Call your care team if you notice any unusual bleeding. Be careful brushing or flossing your teeth or using a toothpick because you may get an infection or bleed more easily. If you have any dental work done, tell your dentist you are receiving this medication Check with your care team if you get an attack of severe diarrhea, nausea and vomiting, or if you sweat a lot. The loss of too much body  fluid can make it dangerous for you to take this medication. Talk to your care team about your risk of cancer. You may be more at risk for certain types of cancers if you take this medication. Do not become pregnant while taking this medication or for 6 months after stopping it. Women should inform their care team if they wish to become pregnant or think they might be pregnant. Men should not father a child while taking this medication and for 3 months after stopping it. There is  potential for serious harm to an unborn child. Talk to your care team for more information. Do not breast-feed an infant while taking this medication or for 1 week after stopping it. This medication may make it more difficult to get pregnant or father a child. Talk to your care team if you are concerned about your fertility. What side effects may I notice from receiving this medication? Side effects that you should report to your care team as soon as possible: Allergic reactions-skin rash, itching, hives, swelling of the face, lips, tongue, or throat Blood clot-pain, swelling, or warmth in the leg, shortness of breath, chest pain Dry cough, shortness of breath or trouble breathing Infection-fever, chills, cough, sore throat, wounds that don't heal, pain or trouble when passing urine, general feeling of discomfort or being unwell Kidney injury-decrease in the amount of urine, swelling of the ankles, hands, or feet Liver injury-right upper belly pain, loss of appetite, nausea, light-colored stool, dark yellow or brown urine, yellowing of the skin or eyes, unusual weakness or fatigue Low red blood cell count-unusual weakness or fatigue, dizziness, headache, trouble breathing Redness, blistering, peeling, or loosening of the skin, including inside the mouth Seizures Unusual bruising or bleeding Side effects that usually do not require medical attention (report to your care team if they continue or are bothersome): Diarrhea Dizziness Hair loss Nausea Pain, redness, or swelling with sores inside the mouth or throat Vomiting This list may not describe all possible side effects. Call your doctor for medical advice about side effects. You may report side effects to FDA at 1-800-FDA-1088. Where should I keep my medication? This medication is given in a hospital or clinic. It will not be stored at home. NOTE: This sheet is a summary. It may not cover all possible information. If you have questions about  this medicine, talk to your doctor, pharmacist, or health care provider.  2022 Elsevier/Gold Standard (2020-06-01 12:51:29)  If you test POSITIVE for COVID19 and have MILD to MODERATE symptoms: First, call your PCP if you would like to receive COVID19 treatment AND Hold your medications during the infection and for at least 1 week after your symptoms have resolved: Injectable medication (Benlysta, Cimzia, Cosentyx, Enbrel, Humira, Orencia, Remicade, Simponi, Stelara, Taltz, Tremfya) Methotrexate Leflunomide (Arava) Azathioprine Mycophenolate (Cellcept) Roma Kayser, or Rinvoq Otezla If you take Actemra or Kevzara, you DO NOT need to hold these for COVID19 infection.  If you test POSITIVE for COVID19 and have NO symptoms: First, call your PCP if you would like to receive COVID19 treatment AND Hold your medications for at least 10 days after the day that you tested positive Injectable medication (Benlysta, Cimzia, Cosentyx, Enbrel, Humira, Orencia, Remicade, Simponi, Stelara, Taltz, Tremfya) Methotrexate Leflunomide (Arava) Azathioprine Mycophenolate (Cellcept) Roma Kayser, or Rinvoq Otezla If you take Actemra or Kevzara, you DO NOT need to hold these for COVID19 infection.  If you have signs or symptoms of an infection or start antibiotics: First, call your PCP for workup  of your infection. Hold your medication through the infection, until you complete your antibiotics, and until symptoms resolve if you take the following: Injectable medication (Actemra, Benlysta, Cimzia, Cosentyx, Enbrel, Humira, Kevzara, Orencia, Remicade, Simponi, Stelara, Taltz, Tremfya) Methotrexate Leflunomide (Arava) Mycophenolate (Cellcept) Morrie Sheldon, Olumiant, or Rinvoq  Vaccines You are taking a medication(s) that can suppress your immune system.  The following immunizations are recommended: Flu annually Covid-19  Td/Tdap (tetanus, diphtheria, pertussis) every 10 years Pneumonia (Prevnar 15  then Pneumovax 23 at least 1 year apart.  Alternatively, can take Prevnar 20 without needing additional dose) Shingrix: 2 doses from 4 weeks to 6 months apart  Please check with your PCP to make sure you are up to date.  COVID-19 vaccine recommendations:   COVID-19 vaccine is recommended for everyone (unless you are allergic to a vaccine component), even if you are on a medication that suppresses your immune system.   If you are on Methotrexate, Cellcept (mycophenolate), Rinvoq, Morrie Sheldon, and Olumiant- hold the medication for 1 week after each vaccine. Hold Methotrexate for 2 weeks after the single dose COVID-19 vaccine.   If you are on Orencia subcutaneous injection - hold medication one week prior to and one week after the first COVID-19 vaccine dose (only).   If you are on Orencia IV infusions- time vaccination administration so that the first COVID-19 vaccination will occur four weeks after the infusion and postpone the subsequent infusion by one week.   If you are on Cyclophosphamide or Rituxan infusions please contact your doctor prior to receiving the COVID-19 vaccine.   Do not take Tylenol or any anti-inflammatory medications (NSAIDs) 24 hours prior to the COVID-19 vaccination.   There is no direct evidence about the efficacy of the COVID-19 vaccine in individuals who are on medications that suppress the immune system.   Even if you are fully vaccinated, and you are on any medications that suppress your immune system, please continue to wear a mask, maintain at least six feet social distance and practice hand hygiene.   If you develop a COVID-19 infection, please contact your PCP or our office to determine if you need monoclonal antibody infusion.  The booster vaccine is now available for immunocompromised patients.   Please see the following web sites for updated information.   [email or web address] Heart Disease  Prevention   Your inflammatory disease increases your risk of heart disease which includes heart attack, stroke, atrial fibrillation (irregular heartbeats), high blood pressure, heart failure and atherosclerosis (plaque in the arteries).  It is important to reduce your risk by:   Keep blood pressure, cholesterol, and blood sugar at healthy levels   Smoking Cessation   Maintain a healthy weight  BMI 20-25   Eat a healthy diet  Plenty of fresh fruit, vegetables, and whole grains  Limit saturated fats, foods high in sodium, and added sugars  DASH and Mediterranean diet   Increase physical activity  Recommend moderate physically activity for 150 minutes per week/ 30 minutes a day for five days a week These can be broken up into three separate ten-minute sessions during the day.   Reduce Stress  Meditation, slow breathing exercises, yoga, coloring books  Dental visits twice a year

## 2021-02-03 ENCOUNTER — Other Ambulatory Visit (HOSPITAL_BASED_OUTPATIENT_CLINIC_OR_DEPARTMENT_OTHER): Payer: Self-pay

## 2021-02-03 ENCOUNTER — Encounter: Payer: Self-pay | Admitting: Oncology

## 2021-02-03 LAB — HEPATITIS C ANTIBODY
Hepatitis C Ab: NONREACTIVE
SIGNAL TO CUT-OFF: 0.03 (ref <1.00)

## 2021-02-03 LAB — HEPATITIS B SURFACE ANTIGEN: Hepatitis B Surface Ag: NONREACTIVE

## 2021-02-03 LAB — HEPATITIS B CORE ANTIBODY, IGM: Hep B C IgM: NONREACTIVE

## 2021-02-03 MED ORDER — FOLIC ACID 1 MG PO TABS
2.0000 mg | ORAL_TABLET | Freq: Every day | ORAL | 3 refills | Status: DC
  Filled 2021-02-03: qty 180, 90d supply, fill #0
  Filled 2021-07-07: qty 180, 90d supply, fill #1
  Filled 2021-12-03: qty 180, 90d supply, fill #2

## 2021-02-03 MED ORDER — METHOTREXATE SODIUM CHEMO INJECTION 50 MG/2ML
INTRAMUSCULAR | 0 refills | Status: DC
  Filled 2021-02-03: qty 4, 28d supply, fill #0

## 2021-02-03 MED ORDER — ONDANSETRON HCL 4 MG PO TABS
4.0000 mg | ORAL_TABLET | Freq: Four times a day (QID) | ORAL | 1 refills | Status: DC | PRN
  Filled 2021-02-03: qty 30, 8d supply, fill #0
  Filled 2021-04-22: qty 30, 8d supply, fill #1

## 2021-02-03 MED ORDER — "TUBERCULIN SYRINGE 27G X 1/2"" 1 ML MISC"
12.0000 | 3 refills | Status: DC
  Filled 2021-02-03: qty 12, 84d supply, fill #0
  Filled 2021-04-22: qty 12, 84d supply, fill #1
  Filled 2021-07-07 – 2021-08-10 (×2): qty 12, 84d supply, fill #2

## 2021-02-03 NOTE — Telephone Encounter (Signed)
Per office note on 02/02/2021: plan is to start on methotrexate 0.6 mL subcu weekly and then increase it to 0.8 mL subcu weekly if the labs are stable.  Also started on folic acid 2 mg p.o. daily once we get the results of hepatitis B and C.  Advised her to stop prednisone.  I will also give her prescription for Zofran 4 mg p.o. every 6 hours as needed nausea total 30 tablets with 1 refill.

## 2021-02-03 NOTE — Progress Notes (Signed)
Hepatitis panel is negative.

## 2021-02-04 ENCOUNTER — Other Ambulatory Visit (HOSPITAL_BASED_OUTPATIENT_CLINIC_OR_DEPARTMENT_OTHER): Payer: Self-pay

## 2021-02-05 ENCOUNTER — Other Ambulatory Visit (HOSPITAL_BASED_OUTPATIENT_CLINIC_OR_DEPARTMENT_OTHER): Payer: Self-pay

## 2021-02-16 ENCOUNTER — Other Ambulatory Visit: Payer: Self-pay | Admitting: Internal Medicine

## 2021-02-16 ENCOUNTER — Other Ambulatory Visit (HOSPITAL_BASED_OUTPATIENT_CLINIC_OR_DEPARTMENT_OTHER): Payer: Self-pay

## 2021-02-16 MED FILL — Sertraline HCl Tab 100 MG: ORAL | 90 days supply | Qty: 180 | Fill #0 | Status: AC

## 2021-02-18 ENCOUNTER — Other Ambulatory Visit: Payer: Self-pay | Admitting: *Deleted

## 2021-02-18 DIAGNOSIS — Z79899 Other long term (current) drug therapy: Secondary | ICD-10-CM | POA: Diagnosis not present

## 2021-02-19 LAB — CBC WITH DIFFERENTIAL/PLATELET
Absolute Monocytes: 731 cells/uL (ref 200–950)
Basophils Absolute: 46 cells/uL (ref 0–200)
Basophils Relative: 0.4 %
Eosinophils Absolute: 290 cells/uL (ref 15–500)
Eosinophils Relative: 2.5 %
HCT: 41.6 % (ref 35.0–45.0)
Hemoglobin: 14.2 g/dL (ref 11.7–15.5)
Lymphs Abs: 3051 cells/uL (ref 850–3900)
MCH: 31 pg (ref 27.0–33.0)
MCHC: 34.1 g/dL (ref 32.0–36.0)
MCV: 90.8 fL (ref 80.0–100.0)
MPV: 10.2 fL (ref 7.5–12.5)
Monocytes Relative: 6.3 %
Neutro Abs: 7482 cells/uL (ref 1500–7800)
Neutrophils Relative %: 64.5 %
Platelets: 305 10*3/uL (ref 140–400)
RBC: 4.58 10*6/uL (ref 3.80–5.10)
RDW: 13.1 % (ref 11.0–15.0)
Total Lymphocyte: 26.3 %
WBC: 11.6 10*3/uL — ABNORMAL HIGH (ref 3.8–10.8)

## 2021-02-19 LAB — COMPLETE METABOLIC PANEL WITH GFR
AG Ratio: 1.8 (calc) (ref 1.0–2.5)
ALT: 18 U/L (ref 6–29)
AST: 15 U/L (ref 10–35)
Albumin: 4.4 g/dL (ref 3.6–5.1)
Alkaline phosphatase (APISO): 54 U/L (ref 31–125)
BUN: 11 mg/dL (ref 7–25)
CO2: 31 mmol/L (ref 20–32)
Calcium: 9.8 mg/dL (ref 8.6–10.2)
Chloride: 99 mmol/L (ref 98–110)
Creat: 0.71 mg/dL (ref 0.50–0.99)
Globulin: 2.4 g/dL (calc) (ref 1.9–3.7)
Glucose, Bld: 94 mg/dL (ref 65–99)
Potassium: 4.2 mmol/L (ref 3.5–5.3)
Sodium: 138 mmol/L (ref 135–146)
Total Bilirubin: 0.7 mg/dL (ref 0.2–1.2)
Total Protein: 6.8 g/dL (ref 6.1–8.1)
eGFR: 104 mL/min/{1.73_m2} (ref >=60)

## 2021-02-23 ENCOUNTER — Other Ambulatory Visit (HOSPITAL_BASED_OUTPATIENT_CLINIC_OR_DEPARTMENT_OTHER): Payer: Self-pay

## 2021-02-27 ENCOUNTER — Other Ambulatory Visit (HOSPITAL_COMMUNITY): Payer: Self-pay

## 2021-03-02 NOTE — Progress Notes (Deleted)
Office Visit Note  Patient: [name]             Date of Birth: January 13, 1971           MRN: [id number]             PCP: Biagio Borg, MD Referring: Biagio Borg, MD Visit Date: 03/16/2021 Occupation: '@GUAROCC' @  Subjective:  No chief complaint on file.   History of Present Illness: [name] is a 50 y.o. female ***   Activities of Daily Living:  Patient reports morning stiffness for *** {minute/hour:19697}.   Patient {ACTIONS;DENIES/REPORTS:21021675::"Denies"} nocturnal pain.  Difficulty dressing/grooming: {ACTIONS;DENIES/REPORTS:21021675::"Denies"} Difficulty climbing stairs: {ACTIONS;DENIES/REPORTS:21021675::"Denies"} Difficulty getting out of chair: {ACTIONS;DENIES/REPORTS:21021675::"Denies"} Difficulty using hands for taps, buttons, cutlery, and/or writing: {ACTIONS;DENIES/REPORTS:21021675::"Denies"}  No Rheumatology ROS completed.   PMFS History:  Patient Active Problem List   Diagnosis Date Noted   Incisional hernia 12/25/2020   Mediastinal lymphadenopathy    Pulmonary nodules 10/20/2020   Multiple skin nodules 07/24/2020   Viral illness 06/14/2020   Right lumbar radiculopathy 29/92/4268   Periumbilical hernia 34/19/6222   TB lung, latent 05/18/2020   Acute non-recurrent maxillary sinusitis 11/09/2019   HTN (hypertension) 04/14/2019   Mass of left wrist 04/14/2019   External otitis of right ear 04/14/2019   Choledocholithiasis    Abnormal cholangiogram    Cholecystitis with cholelithiasis 07/24/2018   Hyperglycemia 10/16/2017   Hot flash not due to menopause 10/16/2017   Eczema 10/16/2017   Flu-like symptoms 06/20/2017   Cognitive changes 01/30/2017   Disturbed concentration 01/26/2017   Left shoulder pain 11/01/2016   Right low back pain 11/01/2016   Elevated blood pressure, situational 05/05/2016   Surgical menopause 01/29/2016   OSA on CPAP 12/22/2015   Cough 11/26/2015   Capsulitis 10/06/2015   Ganglion cyst of left foot 10/06/2015    Fatigue 09/13/2015   Asthma 09/08/2015   Exertional dyspnea 09/08/2015   Peroneal ganglion cyst 07/20/2015   Low back pain 07/20/2015   Peroneal tendinitis of left lower leg 06/25/2015   Nonallopathic lesion of cervical region 06/25/2015   Wheezing 05/08/2015   Polyarthralgia 05/08/2015   Peripheral edema 03/16/2015   Central line complication 97/98/9211   Morbid obesity with BMI of 45.0-49.9, adult ([place]) 02/28/2015   Chemotherapy induced neutropenia ([place]) 02/28/2015   Genetic testing 01/05/2015   Chemotherapy induced nausea and vomiting 01/02/2015   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/02/2015   Premature surgical menopause 12/17/2014   Leukopenia due to antineoplastic chemotherapy ([place]) 12/17/2014   Port-A-Cath in place 12/17/2014   Encounter for antineoplastic chemotherapy 12/17/2014   Myalgia 12/09/2014   Hypersensitivity reaction 12/05/2014   Poor venous access 11/28/2014   Postoperative cellulitis of surgical wound 11/24/2014   Tobacco dependence 11/12/2014   Malignant neoplasm of both ovaries 11/10/2014   Anxiety 11/10/2014   Adnexal mass 11/10/2014   Encounter for well adult exam with abnormal findings 09/10/2014   Hypothyroidism 09/10/2014   Hypersomnolence 09/10/2014   Acute non-recurrent frontal sinusitis 06/24/2014   Right knee pain 06/24/2014   Lower back pain 01/08/2014   EAR PAIN, BILATERAL 12/18/2009   KNEE PAIN, RIGHT 12/18/2009   INGROWN TOENAIL 12/17/2008   BACK PAIN 12/17/2008   Acute bronchospasm 11/11/2008   Depression 11/29/2007   Insomnia 11/29/2007   Allergic rhinitis 04/13/2007   GERD 04/13/2007   Endometriosis determined by laparoscopy 04/13/2007   Migraine headache 01/29/2007    Past Medical History:  Diagnosis Date   Anxiety    Arthritis    Depression  Dyspnea    Elevated blood pressure, situational 05/05/2016   Endometriosis    Exercise-induced asthma 09/08/2015   GERD (gastroesophageal reflux disease)    History  of kidney stones    Hypothyroidism    Migraine    Ovarian cancer, bilateral 11/28/2014   PONV (postoperative nausea and vomiting)    Sleep apnea    Thyroid disease    Umbilical hernia    woke up while porta cath removed     Family History  Problem Relation Age of Onset   Hypertension Mother    Diabetes Mother    Stroke Mother        Deceased   Heart disease Mother    Osteoarthritis Mother    Atrial fibrillation Father        Living   Stroke Father        optic nerve left eye   Healthy Brother    Diabetes Maternal Aunt    Heart failure Maternal Grandmother    Heart attack Maternal Grandfather    Dementia Paternal Grandmother    Dementia Paternal Grandfather    Past Surgical History:  Procedure Laterality Date   ABDOMINAL HYSTERECTOMY  11/10/2014   at [facility], Exp lap, supracervical hyst, BSO, infracolic omentectomy, lymphadenectomy, aortic lymph node sampling   BRONCHIAL NEEDLE ASPIRATION BIOPSY  12/16/2020   Procedure: BRONCHIAL NEEDLE ASPIRATION BIOPSIES;  Surgeon: Rigoberto Noel, MD;  Location: [facility];  Service: Cardiopulmonary;;   BRONCHIAL WASHINGS  12/16/2020   Procedure: BRONCHIAL WASHINGS;  Surgeon: Rigoberto Noel, MD;  Location: [facility];  Service: Cardiopulmonary;;   CHOLECYSTECTOMY N/A 07/24/2018   Procedure: LAPAROSCOPIC CHOLECYSTECTOMY WITH INTRAOPERATIVE CHOLANGIOGRAM;  Surgeon: Armandina Gemma, MD;  Location: [facility];  Service: General;  Laterality: N/A;   ERCP N/A 07/26/2018   Procedure: ENDOSCOPIC RETROGRADE CHOLANGIOPANCREATOGRAPHY (ERCP);  Surgeon: Ladene Artist, MD;  Location: [facility];  Service: Endoscopy;  Laterality: N/A;   GANGLION CYST EXCISION Right    hand   INCISIONAL HERNIA REPAIR N/A 12/25/2020   Procedure: LAPAROSCOPIC INCISIONAL HERNIA REPAIR WITH MESH;  Surgeon: Kinsinger, Arta Bruce, MD;  Location: [facility];  Service: General;  Laterality: N/A;   IR GENERIC HISTORICAL  05/12/2016   IR REMOVAL TUN ACCESS W/ PORT W/O FL MOD SED 05/12/2016  [facility]   LIPOMA EXCISION Left    ankle   MENISCUS REPAIR Right    Porta a cath removal     PORTA CATH INSERTION     REMOVAL OF STONES  07/26/2018   Procedure: REMOVAL OF STONES;  Surgeon: Ladene Artist, MD;  Location: [facility];  Service: Endoscopy;;   SPHINCTEROTOMY  07/26/2018   Procedure: Joan Mayans;  Surgeon: Ladene Artist, MD;  Location: [facility];  Service: Endoscopy;;   VIDEO BRONCHOSCOPY WITH ENDOBRONCHIAL ULTRASOUND N/A 12/16/2020   Procedure: VIDEO BRONCHOSCOPY WITH ENDOBRONCHIAL ULTRASOUND;  Surgeon: Rigoberto Noel, MD;  Location: [facility];  Service: Cardiopulmonary;  Laterality: N/A;   Social History   Social History Narrative   Lives with boyfriend in a one story home.  Has no children.     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     Education: college.   Right handed    Immunization History  Administered Date(s) Administered   Influenza,inj,Quad PF,6+ Mos 01/02/2014, 03/26/2015, 12/25/2017, 01/21/2020, 01/25/2021   Influenza-Unspecified 02/23/2016, 02/09/2017, 02/12/2020   MMR 02/09/2017, 07/03/2017   PFIZER(Purple Top)SARS-COV-2 Vaccination 08/01/2019, 08/22/2019, 02/22/2020   Tdap 11/07/2017   Tetanus 12/21/2015     Objective: Vital Signs: LMP 11/02/2014  Physical Exam   Musculoskeletal Exam: ***  CDAI Exam: CDAI Score: -- Patient Global: --; Provider Global: -- Swollen: --; Tender: -- Joint Exam 03/16/2021   No joint exam has been documented for this visit   There is currently no information documented on the homunculus. Go to the Rheumatology activity and complete the homunculus joint exam.  Investigation: No additional findings.  Imaging: No results found.  Recent Labs: Lab Results  Component Value Date   WBC 11.6 (H) 02/18/2021   HGB 14.2 02/18/2021   PLT 305 02/18/2021   NA 138 02/18/2021   K 4.2 02/18/2021   CL 99 02/18/2021   CO2 31 02/18/2021   GLUCOSE 94 02/18/2021   BUN 11 02/18/2021   CREATININE 0.71 02/18/2021    BILITOT 0.7 02/18/2021   ALKPHOS 56 06/15/2020   AST 15 02/18/2021   ALT 18 02/18/2021   PROT 6.8 02/18/2021   ALBUMIN 4.3 06/15/2020   CALCIUM 9.8 02/18/2021   GFRAA 110 11/11/2020   QFTBGOLDPLUS NEGATIVE 11/11/2020    Speciality Comments: No specialty comments available.  Procedures:  No procedures performed Allergies: Moxifloxacin, Quinolones, Doxycycline, Escitalopram oxalate, Lisinopril, and Sulfonamide derivatives   Assessment / Plan:     Visit Diagnoses: No diagnosis found.  Orders: No orders of the defined types were placed in this encounter.  No orders of the defined types were placed in this encounter.   Face-to-face time spent with patient was *** minutes. Greater than 50% of time was spent in counseling and coordination of care.  Follow-Up Instructions: No follow-ups on file.   Earnestine Mealing, CMA  Note - This record has been created using Editor, commissioning.  Chart creation errors have been sought, but may not always  have been located. Such creation errors do not reflect on  the standard of medical care.

## 2021-03-13 ENCOUNTER — Other Ambulatory Visit: Payer: Self-pay | Admitting: Rheumatology

## 2021-03-13 ENCOUNTER — Other Ambulatory Visit (HOSPITAL_COMMUNITY): Payer: Self-pay

## 2021-03-15 ENCOUNTER — Other Ambulatory Visit (HOSPITAL_BASED_OUTPATIENT_CLINIC_OR_DEPARTMENT_OTHER): Payer: Self-pay

## 2021-03-15 MED ORDER — METHOTREXATE SODIUM CHEMO INJECTION 50 MG/2ML
20.0000 mg | INTRAMUSCULAR | 0 refills | Status: DC
  Filled 2021-03-15: qty 10, 70d supply, fill #0

## 2021-03-15 NOTE — Telephone Encounter (Addendum)
Next Visit: 03/30/2021  Last Visit: 02/02/2021  Last Fill: 02/03/2021  DX:  Sarcoidosis   Current Dose per office note 02/02/2021: plan is to start on methotrexate 0.6 mL subcu weekly and then increase it to 0.8 mL subcu weekly if the labs are stable.  Labs: 02/18/2021 CMP WNL.  WBC count is borderline elevated but has improved.  Rest of CBC WNL.   Patient states she is is taking MTX 0.8 mL weekly.   Okay to refill MTX?

## 2021-03-16 ENCOUNTER — Ambulatory Visit: Payer: 59 | Admitting: Physician Assistant

## 2021-03-16 DIAGNOSIS — R768 Other specified abnormal immunological findings in serum: Secondary | ICD-10-CM

## 2021-03-16 DIAGNOSIS — C563 Malignant neoplasm of bilateral ovaries: Secondary | ICD-10-CM

## 2021-03-16 DIAGNOSIS — R600 Localized edema: Secondary | ICD-10-CM

## 2021-03-16 DIAGNOSIS — G620 Drug-induced polyneuropathy: Secondary | ICD-10-CM

## 2021-03-16 DIAGNOSIS — G4733 Obstructive sleep apnea (adult) (pediatric): Secondary | ICD-10-CM

## 2021-03-16 DIAGNOSIS — M5416 Radiculopathy, lumbar region: Secondary | ICD-10-CM

## 2021-03-16 DIAGNOSIS — J4521 Mild intermittent asthma with (acute) exacerbation: Secondary | ICD-10-CM

## 2021-03-16 DIAGNOSIS — D869 Sarcoidosis, unspecified: Secondary | ICD-10-CM

## 2021-03-16 DIAGNOSIS — M170 Bilateral primary osteoarthritis of knee: Secondary | ICD-10-CM

## 2021-03-16 DIAGNOSIS — E039 Hypothyroidism, unspecified: Secondary | ICD-10-CM

## 2021-03-16 DIAGNOSIS — I10 Essential (primary) hypertension: Secondary | ICD-10-CM

## 2021-03-16 DIAGNOSIS — G43001 Migraine without aura, not intractable, with status migrainosus: Secondary | ICD-10-CM

## 2021-03-16 DIAGNOSIS — Z79899 Other long term (current) drug therapy: Secondary | ICD-10-CM

## 2021-03-16 DIAGNOSIS — Z8719 Personal history of other diseases of the digestive system: Secondary | ICD-10-CM

## 2021-03-16 NOTE — Progress Notes (Signed)
Office Visit Note  Patient: [name]             Date of Birth: Mar 12, 1971           MRN: [id number]             PCP: Biagio Borg, MD Referring: Biagio Borg, MD Visit Date: 03/30/2021 Occupation: _0 @  Subjective:  Medication monitoring  History of Present Illness: [name] is a 50 y.o. female with history of sarcoidosis and osteoarthritis.  Patient is currently taking methotrexate 0.8 mL sq injections once weekly and folic acid 2 mg by mouth daily.  She was started on methotrexate after her last office visit 02/02/2021.  Patient reports that she has been experiencing some nausea, constipation, and headaches the day after taking methotrexate on a weekly basis.  She has been taking Zofran very sparingly for symptomatic relief.  Patient reports that at the end of October 2022 she was diagnosed with COVID-19.  She states that she was evaluated in the ED on 03/27/2021 and was diagnosed with sialolithiasis.  She is currently taking Cefuroxime and Flagyl. She did not hold methotrexate during either infection.  She states that her right sided facial swelling and pain has improved since starting on the antibiotics.  She denies any fevers.  She states that she still has not heard back about an ENT referral which was previously placed. She states that her joint pain and stiffness has improved since starting on methotrexate.  Overall she feels much better since starting on methotrexate.  She is continues to have some stiffness in the right knee joint especially after sitting for prolonged periods of time and when rising from a seated position.  She also experiences intermittent discomfort in both knee joints but denies any obvious joint swelling at this time. She denies any EN lesions. She has noticed scaly dry patches on her hands and is unsure if it is due to eczema.  She would like a referral to dermatology for further evaluation.  In the past her primary care provided clobetasol cream  which alleviated her symptoms.  A refill will be sent to the pharmacy today as requested.    Activities of Daily Living:  Patient reports morning stiffness for 15 minutes.   Patient Reports nocturnal pain.  Difficulty dressing/grooming: Denies Difficulty climbing stairs: Reports Difficulty getting out of chair: Reports Difficulty using hands for taps, buttons, cutlery, and/or writing: Reports  Review of Systems  Constitutional:  Positive for fatigue.  HENT:  Positive for mouth dryness. Negative for mouth sores and nose dryness.   Eyes:  Positive for dryness. Negative for pain and visual disturbance.  Respiratory:  Negative for cough, hemoptysis and difficulty breathing.   Cardiovascular:  Positive for swelling in legs/feet. Negative for chest pain, palpitations and hypertension.  Gastrointestinal:  Positive for constipation. Negative for blood in stool and diarrhea.  Endocrine: Positive for cold intolerance and heat intolerance. Negative for increased urination.  Genitourinary:  Negative for difficulty urinating and painful urination.  Musculoskeletal:  Positive for joint pain, gait problem, joint pain, joint swelling, morning stiffness and muscle tenderness. Negative for myalgias, muscle weakness and myalgias.  Skin:  Positive for rash and redness. Negative for color change, pallor, hair loss, skin tightness, ulcers and sensitivity to sunlight.  Allergic/Immunologic: Positive for susceptible to infections.  Neurological:  Negative for dizziness, headaches and weakness.  Hematological:  Negative for swollen glands.  Psychiatric/Behavioral:  Positive for sleep disturbance. Negative for depressed mood. The patient is  not nervous/anxious.    PMFS History:  Patient Active Problem List   Diagnosis Date Noted   Incisional hernia 12/25/2020   Mediastinal lymphadenopathy    Pulmonary nodules 10/20/2020   Multiple skin nodules 07/24/2020   Viral illness 06/14/2020   Right lumbar  radiculopathy 78/24/2353   Periumbilical hernia 61/44/3154   TB lung, latent 05/18/2020   Acute non-recurrent maxillary sinusitis 11/09/2019   HTN (hypertension) 04/14/2019   Mass of left wrist 04/14/2019   External otitis of right ear 04/14/2019   Choledocholithiasis    Abnormal cholangiogram    Cholecystitis with cholelithiasis 07/24/2018   Hyperglycemia 10/16/2017   Hot flash not due to menopause 10/16/2017   Eczema 10/16/2017   Flu-like symptoms 06/20/2017   Cognitive changes 01/30/2017   Disturbed concentration 01/26/2017   Left shoulder pain 11/01/2016   Right low back pain 11/01/2016   Elevated blood pressure, situational 05/05/2016   Surgical menopause 01/29/2016   OSA on CPAP 12/22/2015   Cough 11/26/2015   Capsulitis 10/06/2015   Ganglion cyst of left foot 10/06/2015   Fatigue 09/13/2015   Asthma 09/08/2015   Exertional dyspnea 09/08/2015   Peroneal ganglion cyst 07/20/2015   Low back pain 07/20/2015   Peroneal tendinitis of left lower leg 06/25/2015   Nonallopathic lesion of cervical region 06/25/2015   Wheezing 05/08/2015   Polyarthralgia 05/08/2015   Peripheral edema 03/16/2015   Central line complication 00/86/7619   Morbid obesity with BMI of 45.0-49.9, adult ([place]) 02/28/2015   Chemotherapy induced neutropenia ([place]) 02/28/2015   Genetic testing 01/05/2015   Chemotherapy induced nausea and vomiting 01/02/2015   Chemotherapy-induced peripheral neuropathy ([place]) 01/02/2015   Premature surgical menopause 12/17/2014   Leukopenia due to antineoplastic chemotherapy ([place]) 12/17/2014   Port-A-Cath in place 12/17/2014   Encounter for antineoplastic chemotherapy 12/17/2014   Myalgia 12/09/2014   Hypersensitivity reaction 12/05/2014   Poor venous access 11/28/2014   Postoperative cellulitis of surgical wound 11/24/2014   Tobacco dependence 11/12/2014   Malignant neoplasm of both ovaries 11/10/2014   Anxiety 11/10/2014   Adnexal mass 11/10/2014   Encounter for  well adult exam with abnormal findings 09/10/2014   Hypothyroidism 09/10/2014   Hypersomnolence 09/10/2014   Acute non-recurrent frontal sinusitis 06/24/2014   Right knee pain 06/24/2014   Lower back pain 01/08/2014   EAR PAIN, BILATERAL 12/18/2009   KNEE PAIN, RIGHT 12/18/2009   INGROWN TOENAIL 12/17/2008   BACK PAIN 12/17/2008   Acute bronchospasm 11/11/2008   Depression 11/29/2007   Insomnia 11/29/2007   Allergic rhinitis 04/13/2007   GERD 04/13/2007   Endometriosis determined by laparoscopy 04/13/2007   Migraine headache 01/29/2007    Past Medical History:  Diagnosis Date   Anxiety    Arthritis    Depression    Dyspnea    Elevated blood pressure, situational 05/05/2016   Endometriosis    Exercise-induced asthma 09/08/2015   GERD (gastroesophageal reflux disease)    History of kidney stones    Hypothyroidism    Migraine    Ovarian cancer, bilateral ([place]) 11/28/2014   PONV (postoperative nausea and vomiting)    Sleep apnea    Thyroid disease    Umbilical hernia    woke up while porta cath removed     Family History  Problem Relation Age of Onset   Hypertension Mother    Diabetes Mother    Stroke Mother        Deceased   Heart disease Mother    Osteoarthritis Mother    Atrial fibrillation Father  Living   Stroke Father        optic nerve left eye   Healthy Brother    Diabetes Maternal Aunt    Heart failure Maternal Grandmother    Heart attack Maternal Grandfather    Dementia Paternal Grandmother    Dementia Paternal Grandfather    Past Surgical History:  Procedure Laterality Date   ABDOMINAL HYSTERECTOMY  11/10/2014   at [facility], Exp lap, supracervical hyst, BSO, infracolic omentectomy, lymphadenectomy, aortic lymph node sampling   BRONCHIAL NEEDLE ASPIRATION BIOPSY  12/16/2020   Procedure: BRONCHIAL NEEDLE ASPIRATION BIOPSIES;  Surgeon: Rigoberto Noel, MD;  Location: [place];  Service: Cardiopulmonary;;   BRONCHIAL WASHINGS  12/16/2020    Procedure: BRONCHIAL WASHINGS;  Surgeon: Rigoberto Noel, MD;  Location: [facility];  Service: Cardiopulmonary;;   CHOLECYSTECTOMY N/A 07/24/2018   Procedure: LAPAROSCOPIC CHOLECYSTECTOMY WITH INTRAOPERATIVE CHOLANGIOGRAM;  Surgeon: Armandina Gemma, MD;  Location: [facility];  Service: General;  Laterality: N/A;   ERCP N/A 07/26/2018   Procedure: ENDOSCOPIC RETROGRADE CHOLANGIOPANCREATOGRAPHY (ERCP);  Surgeon: Ladene Artist, MD;  Location: [facility];  Service: Endoscopy;  Laterality: N/A;   GANGLION CYST EXCISION Right    hand   INCISIONAL HERNIA REPAIR N/A 12/25/2020   Procedure: LAPAROSCOPIC INCISIONAL HERNIA REPAIR WITH MESH;  Surgeon: Kinsinger, Arta Bruce, MD;  Location: [facility];  Service: General;  Laterality: N/A;   IR GENERIC HISTORICAL  05/12/2016   IR REMOVAL TUN ACCESS W/ PORT W/O FL MOD SED 05/12/2016 [facility]   LIPOMA EXCISION Left    ankle   MENISCUS REPAIR Right    Porta a cath removal     PORTA CATH INSERTION     REMOVAL OF STONES  07/26/2018   Procedure: REMOVAL OF STONES;  Surgeon: Ladene Artist, MD;  Location: [facility];  Service: Endoscopy;;   SPHINCTEROTOMY  07/26/2018   Procedure: Joan Mayans;  Surgeon: Ladene Artist, MD;  Location: [facility];  Service: Endoscopy;;   VIDEO BRONCHOSCOPY WITH ENDOBRONCHIAL ULTRASOUND N/A 12/16/2020   Procedure: VIDEO BRONCHOSCOPY WITH ENDOBRONCHIAL ULTRASOUND;  Surgeon: Rigoberto Noel, MD;  Location: [facility];  Service: Cardiopulmonary;  Laterality: N/A;   Social History   Social History Narrative   Lives with boyfriend in a one story home.  Has no children.     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     Education: college.   Right handed    Immunization History  Administered Date(s) Administered   Influenza,inj,Quad PF,6+ Mos 01/02/2014, 03/26/2015, 12/25/2017, 01/21/2020, 01/25/2021   Influenza-Unspecified 02/23/2016, 02/09/2017, 02/12/2020   MMR 02/09/2017, 07/03/2017   PFIZER(Purple Top)SARS-COV-2 Vaccination  08/01/2019, 08/22/2019, 02/22/2020   Tdap 11/07/2017   Tetanus 12/21/2015     Objective: Vital Signs: BP (!) 146/76 (BP Location: Left Arm, Patient Position: Sitting, Cuff Size: Large)   Pulse 90   Resp 16   Ht _0  (1.702 m)   Wt (!) 326 lb (147.9 kg)   LMP 11/02/2014   BMI 51.06 kg/m    Physical Exam Vitals and nursing note reviewed.  Constitutional:      Appearance: She is well-developed.  HENT:     Head: Normocephalic and atraumatic.  Eyes:     Conjunctiva/sclera: Conjunctivae normal.  Pulmonary:     Effort: Pulmonary effort is normal.  Abdominal:     Palpations: Abdomen is soft.  Musculoskeletal:     Cervical back: Normal range of motion.  Skin:    General: Skin is warm and dry.     Capillary Refill: Capillary  refill takes less than 2 seconds.  Neurological:     Mental Status: She is alert and oriented to person, place, and time.  Psychiatric:        Behavior: Behavior normal.     Musculoskeletal Exam: C-spine has good range of motion.  Shoulder joints, elbow joints, wrist joints, MCPs, PIPs, DIPs have good range of motion with no synovitis.  She has tenderness over bilateral first PIP and MCP joints.  Complete fist formation bilaterally.  Hip joints have good range of motion with no discomfort.  Painful range of motion of the right knee joint with slightly limited extension.  Warmth of the right knee noted.  Ankle joints have good range of motion with no tenderness or inflammation on examination.  CDAI Exam: CDAI Score: -- Patient Global: --; Provider Global: -- Swollen: 0 ; Tender: 6  Joint Exam 03/30/2021      Right  Left  MCP 1   Tender   Tender  MCP 2   Tender     IP   Tender   Tender  Knee   Tender        Investigation: No additional findings.  Imaging: CT Maxillofacial W Contrast  Result Date: 03/27/2021 CLINICAL DATA:  50 year old female with right parotid region swelling and pain. Cellulitis. EXAM: CT MAXILLOFACIAL WITH CONTRAST TECHNIQUE:  Multidetector CT imaging of the maxillofacial structures was performed with intravenous contrast. Multiplanar CT image reconstructions were also generated. CONTRAST:  25m OMNIPAQUE IOHEXOL 300 MG/ML  SOLN COMPARISON:  None. FINDINGS: Osseous: Mandible intact. TMJ normally located, and appear within normal limits by CT. Carious left maxillary anterior molar. No other acute dental finding. Maxilla, zygoma, pterygoid, and nasal bones appear intact. Intact central skull base. Visible cervical vertebrae and calvarium appear intact. Orbits: Intact orbital walls. Orbits soft tissues appear symmetric and normal. Sinuses: Clear throughout. Soft tissues: Negative visible thyroid, larynx (glottis is closed), pharynx, parapharyngeal spaces, retropharyngeal space, sublingual space, submandibular, and masticator spaces. Subtle asymmetric enlargement and enhancement of the right parotid gland (coronal image 20). No parotid duct enlargement. No parotid space mass. Only mild superficial fat stranding to the parotid. No sialolithiasis. No fluid collection or soft tissue gas. Major vascular structures in the neck and at the skull base appear patent and normal. No upper cervical lymphadenopathy. Bilateral lymph nodes appears symmetric. Limited intracranial: Negative. IMPRESSION: 1. Subtle inflammation of the right parotid gland compatible with mild sialoadenitis. Favor infectious etiology, with no sialolithiasis or obstructing lesion identified. No abscess, lymphadenopathy, or other complicating features. 2. Carious left maxillary anterior molar. Electronically Signed   By: HGenevie AnnM.D.   On: 03/27/2021 11:22    Recent Labs: Lab Results  Component Value Date   WBC 8.7 03/27/2021   HGB 13.3 03/27/2021   PLT 268 03/27/2021   NA 140 03/27/2021   K 3.5 03/27/2021   CL 104 03/27/2021   CO2 29 03/27/2021   GLUCOSE 122 (H) 03/27/2021   BUN 10 03/27/2021   CREATININE 0.72 03/27/2021   BILITOT 0.7 02/18/2021   ALKPHOS 56  06/15/2020   AST 15 02/18/2021   ALT 18 02/18/2021   PROT 6.8 02/18/2021   ALBUMIN 4.3 06/15/2020   CALCIUM 9.5 03/27/2021   GFRAA 110 11/11/2020   QFTBGOLDPLUS NEGATIVE 11/11/2020    Speciality Comments: No specialty comments available.  Procedures:  No procedures performed Allergies: Moxifloxacin, Quinolones, Doxycycline, Escitalopram oxalate, Lisinopril, and Sulfonamide derivatives     Assessment / Plan:     Visit Diagnoses: Sarcoidosis -  Progression of mediastinal and hilar lymph nodes on the CT scan which improved after prednisone, subjective history of EN lesions, right parotid swelling, lymph node biopsy negative for granulomatosis: She was initially started on prednisone 5 mg daily and oral methotrexate 4 tablets weekly by Dr. Elsworth Soho.  She had not noticed any improvement on oral methotrexate and was switched to injectable methotrexate after her last office visit with Dr. Estanislado Pandy on 02/02/2021.  She is currently on methotrexate 0.8 mL sq injections once weekly and folic acid 2 mg by mouth daily.  She is no longer taking prednisone.  She has started to notice improvement in her joint pain and stiffness since starting on methotrexate.  Overall she has been feeling much better on injectable methotrexate. As discussed below she presented to the ED on 03/27/2021 with right-sided facial swelling and pain.  She was diagnosed with sialolithiasis and is currently being treated with Cefuroxime and Flagyl.  She is still awaiting a consultation with ENT.  A new referral will be placed today for further evaluation and management.  Discussed that she should hold methotrexate while being treated for the current infection. Once the infection is completely cleared she will resume methotrexate as prescribed.  She was advised to notify us if she develops any new or worsening symptoms.  She will follow-up in the office in 6 to 8 weeks to assess her full response to injectable methotrexate.  High risk  medication use - Methotrexate 0.53m sq injections once weekly and folic acid 228mby mouth daily.  Patient was switched from oral to injectable methotrexate after her last office visit on 02/02/2021.  She continues to experience some nausea, constipation, and headaches the day after taking methotrexate.  She has been taking Zofran very sparingly for symptomatic relief.   CBC and BMP updated on 03/27/2021.  She will be due to update lab work at her follow-up visit in 2 months.  Standing orders for CBC and CMP remain in place. Discussed the importance of holding methotrexate if she develops signs or symptoms of an infection and to resume only once the infection has completely cleared.  She voiced understanding.  Sialolithiasis: Patient was evaluated in the ED on 03/27/2021 for right-sided facial swelling and pain.  Maxillofacial CT obtained on 03/27/2021: Subtle inflammation of the right parotid gland compatible with mild sialoadenitis. Favor infectious etiology, with no sialolithiasis or obstructing lesion identified. No abscess, lymphadenopathy, or other complicating features.  She was given a prescription for Cefuroxime and Flagyl.  Advised to hold methotrexate while taking these medications and until her symptoms have completely resolved. A new referral to ENT will be placed for further evaluation and management.  Positive ANA (antinuclear antibody) - Positive ANA, positive sicca symptoms and oral ulcers. AVISE lupus index -1.1, ANA 1: 160NS, antiphosphatidylserine IgM positive  Primary osteoarthritis of both knees: She continues to experience intermittent pain and stiffness in the right knee joint.  She recently had Visco gel injections performed by another provider. She has limited extension with warmth on examination today.  No effusion was noted.  Overall she has started to notice improvement in her pain and stiffness in both knees since starting on methotrexate.  She continues to have some difficulty  rising from a seated position after sitting for prolonged periods of time.  Discussed the importance of lower extremity muscle strengthening.  Right lumbar radiculopathy: She continues to experiencing chronic right sided lower back pain.  Rash and other nonspecific skin eruption: She has had a recurrent rash  on her hands raising the concern for eczema.  A refill of clobetasol cream was sent to the pharmacy today. Referral to dermatology placed today.   Other medical conditions are listed as follows:   Malignant neoplasm of both ovaries ([place]) - 11/09/2014 TAHBSO, CTX X 6 months.  Patient states that she was released by her oncologist to start on immunosuppressive therapy.  Chemotherapy-induced peripheral neuropathy ([facility])  Primary hypertension  OSA on CPAP  History of gastroesophageal reflux (GERD)  Mild intermittent asthma with acute exacerbation  Migraine without aura and with status migrainosus, not intractable  Acquired hypothyroidism  Orders: No orders of the defined types were placed in this encounter.  Meds ordered this encounter  Medications   clobetasol cream (TEMOVATE) 0.05 %    Sig: Apply 1 application topically 2 (two) times daily as needed.    Dispense:  45 g    Refill:  0      Follow-Up Instructions: Return in about 2 months (around 05/30/2021) for Sarcoidosis.   Ofilia Neas, PA-C  Note - This record has been created using Dragon software.  Chart creation errors have been sought, but may not always  have been located. Such creation errors do not reflect on  the standard of medical care.

## 2021-03-18 ENCOUNTER — Other Ambulatory Visit: Payer: Self-pay | Admitting: Internal Medicine

## 2021-03-27 ENCOUNTER — Other Ambulatory Visit: Payer: Self-pay | Admitting: Internal Medicine

## 2021-03-27 ENCOUNTER — Encounter (HOSPITAL_BASED_OUTPATIENT_CLINIC_OR_DEPARTMENT_OTHER): Payer: Self-pay

## 2021-03-27 ENCOUNTER — Other Ambulatory Visit: Payer: Self-pay

## 2021-03-27 ENCOUNTER — Emergency Department (HOSPITAL_BASED_OUTPATIENT_CLINIC_OR_DEPARTMENT_OTHER): Payer: 59

## 2021-03-27 ENCOUNTER — Emergency Department (HOSPITAL_BASED_OUTPATIENT_CLINIC_OR_DEPARTMENT_OTHER)
Admission: EM | Admit: 2021-03-27 | Discharge: 2021-03-27 | Disposition: A | Discharge status: Home / Self Care | Payer: 59 | Attending: Emergency Medicine | Admitting: Emergency Medicine

## 2021-03-27 DIAGNOSIS — Z7951 Long term (current) use of inhaled steroids: Secondary | ICD-10-CM | POA: Insufficient documentation

## 2021-03-27 DIAGNOSIS — J45909 Unspecified asthma, uncomplicated: Secondary | ICD-10-CM | POA: Diagnosis not present

## 2021-03-27 DIAGNOSIS — L03221 Cellulitis of neck: Secondary | ICD-10-CM | POA: Diagnosis not present

## 2021-03-27 DIAGNOSIS — E039 Hypothyroidism, unspecified: Secondary | ICD-10-CM | POA: Insufficient documentation

## 2021-03-27 DIAGNOSIS — Z79899 Other long term (current) drug therapy: Secondary | ICD-10-CM | POA: Insufficient documentation

## 2021-03-27 DIAGNOSIS — I10 Essential (primary) hypertension: Secondary | ICD-10-CM | POA: Insufficient documentation

## 2021-03-27 DIAGNOSIS — Z8543 Personal history of malignant neoplasm of ovary: Secondary | ICD-10-CM | POA: Diagnosis not present

## 2021-03-27 DIAGNOSIS — K1120 Sialoadenitis, unspecified: Secondary | ICD-10-CM | POA: Insufficient documentation

## 2021-03-27 DIAGNOSIS — R220 Localized swelling, mass and lump, head: Secondary | ICD-10-CM | POA: Diagnosis present

## 2021-03-27 LAB — BASIC METABOLIC PANEL
Anion gap: 7 (ref 5–15)
BUN: 10 mg/dL (ref 6–20)
CO2: 29 mmol/L (ref 22–32)
Calcium: 9.5 mg/dL (ref 8.9–10.3)
Chloride: 104 mmol/L (ref 98–111)
Creatinine, Ser: 0.72 mg/dL (ref 0.44–1.00)
GFR, Estimated: >60 mL/min (ref >60)
Glucose, Bld: 122 mg/dL — ABNORMAL HIGH (ref 70–99)
Potassium: 3.5 mmol/L (ref 3.5–5.1)
Sodium: 140 mmol/L (ref 135–145)

## 2021-03-27 LAB — CBC WITH DIFFERENTIAL/PLATELET
Abs Immature Granulocytes: 0.07 10*3/uL (ref 0.00–0.07)
Basophils Absolute: 0 10*3/uL (ref 0.0–0.1)
Basophils Relative: 0 %
Eosinophils Absolute: 0.2 10*3/uL (ref 0.0–0.5)
Eosinophils Relative: 2 %
HCT: 39.9 % (ref 36.0–46.0)
Hemoglobin: 13.3 g/dL (ref 12.0–15.0)
Immature Granulocytes: 1 %
Lymphocytes Relative: 24 %
Lymphs Abs: 2.1 10*3/uL (ref 0.7–4.0)
MCH: 30.5 pg (ref 26.0–34.0)
MCHC: 33.3 g/dL (ref 30.0–36.0)
MCV: 91.5 fL (ref 80.0–100.0)
Monocytes Absolute: 0.5 10*3/uL (ref 0.1–1.0)
Monocytes Relative: 6 %
Neutro Abs: 5.9 10*3/uL (ref 1.7–7.7)
Neutrophils Relative %: 67 %
Platelets: 268 10*3/uL (ref 150–400)
RBC: 4.36 MIL/uL (ref 3.87–5.11)
RDW: 13.4 % (ref 11.5–15.5)
WBC: 8.7 10*3/uL (ref 4.0–10.5)
nRBC: 0 % (ref 0.0–0.2)

## 2021-03-27 MED ORDER — AMOXICILLIN-POT CLAVULANATE 875-125 MG PO TABS: 1.0000 | ORAL_TABLET | Freq: Two times a day (BID) | ORAL | 0 refills | Status: DC

## 2021-03-27 MED ORDER — METRONIDAZOLE 500 MG PO TABS: 500.0000 mg | ORAL_TABLET | Freq: Two times a day (BID) | ORAL | 0 refills | Status: AC

## 2021-03-27 MED ORDER — IOHEXOL 300 MG/ML  SOLN
100.0000 mL | Freq: Once | INTRAMUSCULAR | Status: AC | PRN
  Administered 2021-03-27: 75 mL via INTRAVENOUS

## 2021-03-27 MED ORDER — CEFUROXIME AXETIL 500 MG PO TABS: 500.0000 mg | ORAL_TABLET | Freq: Every day | ORAL | 0 refills | Status: AC

## 2021-03-27 NOTE — Discharge Instructions (Signed)
Return to the ER if you develop fever, new or worsening facial swelling, trouble breathing or swallowing, or any other new/concerning symptoms.

## 2021-03-27 NOTE — ED Provider Notes (Signed)
[facility] EMERGENCY DEPT Provider Note   CSN: [id number] Arrival date & time: 03/27/21  9381     History Chief Complaint  Patient presents with   TMJ area swelling and pain    [name] is a 50 y.o. female.  HPI 50 year old female presents with right-sided facial swelling and pain.  She has been dealing with pain anterior to her ear and swelling for about a month.  Was recently diagnosed with sarcoidosis and was put on methotrexate.  However over the last 2-3 days she has been dealing with more pain and swelling.  It hurts to chew.  There is pain inferior to her ear, in her ear, and proximal.  No fevers.  No trouble swallowing.  Past Medical History:  Diagnosis Date   Anxiety    Arthritis    Depression    Dyspnea    Elevated blood pressure, situational 05/05/2016   Endometriosis    Exercise-induced asthma 09/08/2015   GERD (gastroesophageal reflux disease)    History of kidney stones    Hypothyroidism    Migraine    Ovarian cancer, bilateral ([place]) 11/28/2014   PONV (postoperative nausea and vomiting)    Sleep apnea    Thyroid disease    Umbilical hernia    woke up while porta cath removed     Patient Active Problem List   Diagnosis Date Noted   Incisional hernia 12/25/2020   Mediastinal lymphadenopathy    Pulmonary nodules 10/20/2020   Multiple skin nodules 07/24/2020   Viral illness 06/14/2020   Right lumbar radiculopathy 01/75/1025   Periumbilical hernia 85/27/7824   TB lung, latent 05/18/2020   Acute non-recurrent maxillary sinusitis 11/09/2019   HTN (hypertension) 04/14/2019   Mass of left wrist 04/14/2019   External otitis of right ear 04/14/2019   Choledocholithiasis    Abnormal cholangiogram    Cholecystitis with cholelithiasis 07/24/2018   Hyperglycemia 10/16/2017   Hot flash not due to menopause 10/16/2017   Eczema 10/16/2017   Flu-like symptoms 06/20/2017   Cognitive changes 01/30/2017   Disturbed concentration 01/26/2017    Left shoulder pain 11/01/2016   Right low back pain 11/01/2016   Elevated blood pressure, situational 05/05/2016   Surgical menopause 01/29/2016   OSA on CPAP 12/22/2015   Cough 11/26/2015   Capsulitis 10/06/2015   Ganglion cyst of left foot 10/06/2015   Fatigue 09/13/2015   Asthma 09/08/2015   Exertional dyspnea 09/08/2015   Peroneal ganglion cyst 07/20/2015   Low back pain 07/20/2015   Peroneal tendinitis of left lower leg 06/25/2015   Nonallopathic lesion of cervical region 06/25/2015   Wheezing 05/08/2015   Polyarthralgia 05/08/2015   Peripheral edema 03/16/2015   Central line complication 23/53/6144   Morbid obesity with BMI of 45.0-49.9, adult ([place]) 02/28/2015   Chemotherapy induced neutropenia ([place]) 02/28/2015   Genetic testing 01/05/2015   Chemotherapy induced nausea and vomiting 01/02/2015   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   Premature surgical menopause 12/17/2014   Leukopenia due to antineoplastic chemotherapy ([place]) 12/17/2014   Port-A-Cath in place 12/17/2014   Encounter for antineoplastic chemotherapy 12/17/2014   Myalgia 12/09/2014   Hypersensitivity reaction 12/05/2014   Poor venous access 11/28/2014   Postoperative cellulitis of surgical wound 11/24/2014   Tobacco dependence 11/12/2014   Malignant neoplasm of both ovaries 11/10/2014   Anxiety 11/10/2014   Adnexal mass 11/10/2014   Encounter for well adult exam with abnormal findings 09/10/2014   Hypothyroidism 09/10/2014   Hypersomnolence 09/10/2014   Acute non-recurrent frontal sinusitis 06/24/2014  Right knee pain 06/24/2014   Lower back pain 01/08/2014   EAR PAIN, BILATERAL 12/18/2009   KNEE PAIN, RIGHT 12/18/2009   INGROWN TOENAIL 12/17/2008   BACK PAIN 12/17/2008   Acute bronchospasm 11/11/2008   Depression 11/29/2007   Insomnia 11/29/2007   Allergic rhinitis 04/13/2007   GERD 04/13/2007   Endometriosis determined by laparoscopy 04/13/2007   Migraine headache  01/29/2007    Past Surgical History:  Procedure Laterality Date   ABDOMINAL HYSTERECTOMY  11/10/2014   at [facility], Exp lap, supracervical hyst, BSO, infracolic omentectomy, lymphadenectomy, aortic lymph node sampling   BRONCHIAL NEEDLE ASPIRATION BIOPSY  12/16/2020   Procedure: BRONCHIAL NEEDLE ASPIRATION BIOPSIES;  Surgeon: Rigoberto Noel, MD;  Location: [place];  Service: Cardiopulmonary;;   BRONCHIAL WASHINGS  12/16/2020   Procedure: BRONCHIAL WASHINGS;  Surgeon: Rigoberto Noel, MD;  Location: [facility];  Service: Cardiopulmonary;;   CHOLECYSTECTOMY N/A 07/24/2018   Procedure: LAPAROSCOPIC CHOLECYSTECTOMY WITH INTRAOPERATIVE CHOLANGIOGRAM;  Surgeon: Armandina Gemma, MD;  Location: [facility];  Service: General;  Laterality: N/A;   ERCP N/A 07/26/2018   Procedure: ENDOSCOPIC RETROGRADE CHOLANGIOPANCREATOGRAPHY (ERCP);  Surgeon: Ladene Artist, MD;  Location: [facility];  Service: Endoscopy;  Laterality: N/A;   GANGLION CYST EXCISION Right    hand   INCISIONAL HERNIA REPAIR N/A 12/25/2020   Procedure: LAPAROSCOPIC INCISIONAL HERNIA REPAIR WITH MESH;  Surgeon: Kinsinger, Arta Bruce, MD;  Location: [facility];  Service: General;  Laterality: N/A;   IR GENERIC HISTORICAL  05/12/2016   IR REMOVAL TUN ACCESS W/ PORT W/O FL MOD SED 05/12/2016 [facility]-INTER[facility] RAD   LIPOMA EXCISION Left    ankle   MENISCUS REPAIR Right    Porta a cath removal     PORTA CATH INSERTION     REMOVAL OF STONES  07/26/2018   Procedure: REMOVAL OF STONES;  Surgeon: Ladene Artist, MD;  Location: [facility];  Service: Endoscopy;;   SPHINCTEROTOMY  07/26/2018   Procedure: Joan Mayans;  Surgeon: Ladene Artist, MD;  Location: [facility];  Service: Endoscopy;;   VIDEO BRONCHOSCOPY WITH ENDOBRONCHIAL ULTRASOUND N/A 12/16/2020   Procedure: VIDEO BRONCHOSCOPY WITH ENDOBRONCHIAL ULTRASOUND;  Surgeon: Rigoberto Noel, MD;  Location: [facility];  Service: Cardiopulmonary;  Laterality: N/A;     OB History   No obstetric  history on file.     Family History  Problem Relation Age of Onset   Hypertension Mother    Diabetes Mother    Stroke Mother        Deceased   Heart disease Mother    Osteoarthritis Mother    Atrial fibrillation Father        Living   Stroke Father        optic nerve left eye   Healthy Brother    Diabetes Maternal Aunt    Heart failure Maternal Grandmother    Heart attack Maternal Grandfather    Dementia Paternal Grandmother    Dementia Paternal Grandfather     Social History   Tobacco Use   Smoking status: Never   Smokeless tobacco: Never  Vaping Use   Vaping Use: Never used  Substance Use Topics   Alcohol use: Yes    Comment: Rarely   Drug use: No    Home Medications Prior to Admission medications   Medication Sig Start Date End Date Taking? Authorizing Provider  cefUROXime (CEFTIN) 500 MG tablet Take 1 tablet (500 mg total) by mouth daily for 10 days. 03/27/21 04/06/21 Yes Sherwood Gambler, MD  metroNIDAZOLE (FLAGYL)  500 MG tablet Take 1 tablet (500 mg total) by mouth 2 (two) times daily for 10 days. 03/27/21 04/06/21 Yes Sherwood Gambler, MD  acetaminophen (TYLENOL) 650 MG CR tablet Take 1,300 mg by mouth daily as needed for pain.    [provider]  ALPRAZolam Duanne Moron) 1 MG tablet Take 1 tablet (1 mg total) by mouth 2 (two) times daily as needed for anxiety. 12/01/20   Biagio Borg, MD  benzonatate (TESSALON PERLES) 100 MG capsule 1 - 2 tabs by mouth three times daily as needed for cough 07/24/20   Biagio Borg, MD  Blood Pressure Monitoring (OMRON 3 SERIES BP MONITOR) DEVI Use as directed. 12/09/20     cyclobenzaprine (FLEXERIL) 5 MG tablet Take 1 tablet (5 mg total) by mouth at bedtime as needed for muscle spasms. 10/12/20   Narda Amber K, DO  estradiol (VIVELLE-DOT) 0.1 MG/24HR patch Place 1 patch (0.1 mg total) onto the skin 2 (two) times a week. 09/07/20   Joylene John D, NP  famotidine (PEPCID) 20 MG tablet Take 1 tablet (20 mg total) by mouth 2 (two)  times daily 01/07/21     fluticasone (FLONASE) 50 MCG/ACT nasal spray Place 2 sprays into both nostrils daily. In each nostril 11/09/19   Venia Carbon, MD  Fluticasone-Salmeterol (ADVAIR DISKUS) 250-50 MCG/DOSE AEPB Inhale 1 puff into the lungs 2 (two) times daily. 07/24/20   Biagio Borg, MD  folic acid (FOLVITE) 1 MG tablet Take 2 tablets (2 mg total) by mouth daily. 02/03/21   Bo Merino, MD  gabapentin (NEURONTIN) 600 MG tablet Take 1 tablet (600 mg total) by mouth 3 (three) times daily. 10/12/20 10/12/21  Narda Amber K, DO  hydroxypropyl methylcellulose / hypromellose (ISOPTO TEARS / GONIOVISC) 2.5 % ophthalmic solution Place 1 drop into both eyes daily as needed for dry eyes.    [provider]  ibuprofen (ADVIL) 800 MG tablet Take 1 tablet (800 mg total) by mouth every 8 (eight) hours as needed. 12/25/20   Kinsinger, Arta Bruce, MD  levothyroxine (SYNTHROID) 75 MCG tablet TAKE 1 TABLET BY MOUTH EVERY DAY Patient taking differently: Take 75 mcg by mouth daily before breakfast. 06/10/20   Biagio Borg, MD  losartan (COZAAR) 25 MG tablet Take 1 tablet (25 mg total) by mouth daily. 12/03/20   Biagio Borg, MD  methotrexate (RHEUMATREX) 2.5 MG tablet Take 4 tablets (10 mg total) by mouth once a week. Caution: Chemotherapy. Protect from light. 01/25/21   Rigoberto Noel, MD  methotrexate 50 MG/2ML injection Inject 0.8 mLs (20 mg total) into the skin once a week. 03/15/21   Ofilia Neas, PA-C  omeprazole (PRILOSEC) 20 MG capsule Take 20 mg by mouth daily. Patient not taking: Reported on 02/02/2021    [provider]  ondansetron (ZOFRAN) 4 MG tablet Take 1 tablet (4 mg total) by mouth every 6 (six) hours as needed for nausea or vomiting. 02/03/21   Bo Merino, MD  sertraline (ZOLOFT) 100 MG tablet TAKE 2 TABLETS (200 MG TOTAL) BY MOUTH DAILY. Patient taking differently: Take 100 mg by mouth in the morning and at bedtime. 05/18/20   Biagio Borg, MD  sodium chloride (OCEAN)  0.65 % SOLN nasal spray Place 1 spray into both nostrils daily as needed for congestion.    [provider]  traZODone (DESYREL) 50 MG tablet Take 0.5-1 tablets (25-50 mg total) by mouth at bedtime as needed for sleep. Patient taking differently: Take 50 mg  by mouth at bedtime. 10/02/20   Biagio Borg, MD  TUBERCULIN SYR 1CC/27GX1/2" (B-D TB SYRINGE 1CC/27GX1/2") 27G X 1/2" 1 ML MISC Use once a week. 02/03/21   Bo Merino, MD    Allergies    Moxifloxacin, Quinolones, Doxycycline, Escitalopram oxalate, Lisinopril, and Sulfonamide derivatives  Review of Systems   Review of Systems  Constitutional:  Negative for fever.  HENT:  Positive for ear pain and facial swelling. Negative for trouble swallowing.    Physical Exam Updated Vital Signs BP (!) 131/92 (BP Location: Left Arm)   Pulse 78   Temp 98.5 F (36.9 C) (Oral)   Resp 18   LMP 11/02/2014   SpO2 100%   Physical Exam Vitals and nursing note reviewed.  Constitutional:      Appearance: She is well-developed. She is obese.  HENT:     Head: Normocephalic and atraumatic.      Right Ear: Tympanic membrane and external ear normal.     Left Ear: Tympanic membrane and external ear normal.     Ears:     Comments: Right ear canal seems a bit narrower compared to left, but no debris/obvious infection No mastoid tenderness on right    Nose: Nose normal.  Eyes:     General:        Right eye: No discharge.        Left eye: No discharge.  Cardiovascular:     Rate and Rhythm: Normal rate and regular rhythm.     Heart sounds: Normal heart sounds.  Pulmonary:     Effort: Pulmonary effort is normal.  Abdominal:     General: There is no distension.  Skin:    General: Skin is warm and dry.  Neurological:     Mental Status: She is alert.  Psychiatric:        Mood and Affect: Mood is not anxious.    ED Results / Procedures / Treatments   Labs (all labs ordered are listed, but only abnormal results are displayed) Labs  Reviewed  BASIC METABOLIC PANEL - Abnormal; Notable for the following components:      Result Value   Glucose, Bld 122 (*)    All other components within normal limits  CBC WITH DIFFERENTIAL/PLATELET    EKG None  Radiology CT Maxillofacial W Contrast  Result Date: 03/27/2021 CLINICAL DATA:  50 year old female with right parotid region swelling and pain. Cellulitis. EXAM: CT MAXILLOFACIAL WITH CONTRAST TECHNIQUE: Multidetector CT imaging of the maxillofacial structures was performed with intravenous contrast. Multiplanar CT image reconstructions were also generated. CONTRAST:  20mL OMNIPAQUE IOHEXOL 300 MG/ML  SOLN COMPARISON:  None. FINDINGS: Osseous: Mandible intact. TMJ normally located, and appear within normal limits by CT. Carious left maxillary anterior molar. No other acute dental finding. Maxilla, zygoma, pterygoid, and nasal bones appear intact. Intact central skull base. Visible cervical vertebrae and calvarium appear intact. Orbits: Intact orbital walls. Orbits soft tissues appear symmetric and normal. Sinuses: Clear throughout. Soft tissues: Negative visible thyroid, larynx (glottis is closed), pharynx, parapharyngeal spaces, retropharyngeal space, sublingual space, submandibular, and masticator spaces. Subtle asymmetric enlargement and enhancement of the right parotid gland (coronal image 20). No parotid duct enlargement. No parotid space mass. Only mild superficial fat stranding to the parotid. No sialolithiasis. No fluid collection or soft tissue gas. Major vascular structures in the neck and at the skull base appear patent and normal. No upper cervical lymphadenopathy. Bilateral lymph nodes appears symmetric. Limited intracranial: Negative. IMPRESSION: 1. Subtle inflammation of the  right parotid gland compatible with mild sialoadenitis. Favor infectious etiology, with no sialolithiasis or obstructing lesion identified. No abscess, lymphadenopathy, or other complicating features. 2.  Carious left maxillary anterior molar. Electronically Signed   By: Genevie Ann M.D.   On: 03/27/2021 11:22    Procedures Procedures   Medications Ordered in ED Medications  iohexol (OMNIPAQUE) 300 MG/ML solution 100 mL (75 mLs Intravenous Contrast Given 03/27/21 1103)    ED Course  I have reviewed the triage vital signs and the nursing notes.  Pertinent labs & imaging results that were available during my care of the patient were reviewed by me and considered in my medical decision making (see chart for details).    MDM Rules/Calculators/A&P                           CT seems to show parotid gland inflammation.  We will treat as an infection given no obvious stone.  She is also immunosuppressed on methotrexate.  There is an interaction between Augmentin and methotrexate so we will have to put her on cefuroxime and Flagyl.  Otherwise, she seems stable for outpatient treatment and work-up and follow-up with ENT. Final Clinical Impression(s) / ED Diagnoses Final diagnoses:  Sialoadenitis    Rx / DC Orders ED Discharge Orders          Ordered    amoxicillin-clavulanate (AUGMENTIN) 875-125 MG tablet  2 times daily,   Status:  Discontinued        03/27/21 1150    cefUROXime (CEFTIN) 500 MG tablet  Daily        03/27/21 1152    metroNIDAZOLE (FLAGYL) 500 MG tablet  2 times daily        03/27/21 1152             Sherwood Gambler, MD 03/27/21 1252

## 2021-03-27 NOTE — ED Notes (Signed)
Patient transported to CT 

## 2021-03-27 NOTE — ED Triage Notes (Signed)
She has had pain and swelling issues with her bilat. TMJ areas(worse on right). She states she was referred to EMS, however, the  ENT to which she was referred retired. She further tells me that she has sarcoidosis, for which she regularly takes methotrexate injections.

## 2021-03-30 ENCOUNTER — Ambulatory Visit: Payer: 59 | Admitting: Physician Assistant

## 2021-03-30 ENCOUNTER — Encounter: Payer: Self-pay | Admitting: Physician Assistant

## 2021-03-30 ENCOUNTER — Other Ambulatory Visit: Payer: Self-pay

## 2021-03-30 VITALS — BP 146/76 | HR 90 | Resp 16 | Ht 67.0 in | Wt 326.0 lb

## 2021-03-30 DIAGNOSIS — K115 Sialolithiasis: Secondary | ICD-10-CM

## 2021-03-30 DIAGNOSIS — Z8719 Personal history of other diseases of the digestive system: Secondary | ICD-10-CM

## 2021-03-30 DIAGNOSIS — R768 Other specified abnormal immunological findings in serum: Secondary | ICD-10-CM | POA: Diagnosis not present

## 2021-03-30 DIAGNOSIS — R600 Localized edema: Secondary | ICD-10-CM | POA: Diagnosis not present

## 2021-03-30 DIAGNOSIS — M170 Bilateral primary osteoarthritis of knee: Secondary | ICD-10-CM

## 2021-03-30 DIAGNOSIS — R21 Rash and other nonspecific skin eruption: Secondary | ICD-10-CM

## 2021-03-30 DIAGNOSIS — D869 Sarcoidosis, unspecified: Secondary | ICD-10-CM

## 2021-03-30 DIAGNOSIS — T451X5A Adverse effect of antineoplastic and immunosuppressive drugs, initial encounter: Secondary | ICD-10-CM

## 2021-03-30 DIAGNOSIS — J4521 Mild intermittent asthma with (acute) exacerbation: Secondary | ICD-10-CM

## 2021-03-30 DIAGNOSIS — G620 Drug-induced polyneuropathy: Secondary | ICD-10-CM

## 2021-03-30 DIAGNOSIS — Z79899 Other long term (current) drug therapy: Secondary | ICD-10-CM | POA: Diagnosis not present

## 2021-03-30 DIAGNOSIS — M5416 Radiculopathy, lumbar region: Secondary | ICD-10-CM | POA: Diagnosis not present

## 2021-03-30 DIAGNOSIS — E039 Hypothyroidism, unspecified: Secondary | ICD-10-CM

## 2021-03-30 DIAGNOSIS — C563 Malignant neoplasm of bilateral ovaries: Secondary | ICD-10-CM | POA: Diagnosis not present

## 2021-03-30 DIAGNOSIS — G43001 Migraine without aura, not intractable, with status migrainosus: Secondary | ICD-10-CM

## 2021-03-30 DIAGNOSIS — Z9989 Dependence on other enabling machines and devices: Secondary | ICD-10-CM

## 2021-03-30 DIAGNOSIS — I10 Essential (primary) hypertension: Secondary | ICD-10-CM

## 2021-03-30 DIAGNOSIS — G4733 Obstructive sleep apnea (adult) (pediatric): Secondary | ICD-10-CM

## 2021-03-30 MED ORDER — CLOBETASOL PROPIONATE 0.05 % EX CREA: 1.0000 "application " | TOPICAL_CREAM | Freq: Two times a day (BID) | CUTANEOUS | 0 refills | Status: DC | PRN

## 2021-03-30 NOTE — Patient Instructions (Signed)
Standing Labs We placed an order today for your standing lab work.   Please have your standing labs drawn in January and every 3 months   If possible, please have your labs drawn 2 weeks prior to your appointment so that the provider can discuss your results at your appointment.  Please note that you may see your imaging and lab results in MyChart before we have reviewed them. We may be awaiting multiple results to interpret others before contacting you. Please allow our office up to 72 hours to thoroughly review all of the results before contacting the office for clarification of your results.  We have open lab daily: Monday through Thursday from 1:30-4:30 PM and Friday from 1:30-4:00 PM at the office of Dr. Shaili Deveshwar, [facility].   Please be advised, all patients with office appointments requiring lab work will take precedent over walk-in lab work.  If possible, please come for your lab work on Monday and Friday afternoons, as you may experience shorter wait times. The office is located at [address], [address], [place], [place] [address] No appointment is necessary.   Labs are drawn by [facility]. Please bring your co-pay at the time of your lab draw.  You may receive a bill from [facility] for your lab work.  If you wish to have your labs drawn at another location, please call the office 24 hours in advance to send orders.  If you have any questions regarding directions or hours of operation,  please call [phone].   As a reminder, please drink plenty of water prior to coming for your lab work. Thanks!  

## 2021-03-31 ENCOUNTER — Telehealth: Payer: Self-pay | Admitting: *Deleted

## 2021-03-31 DIAGNOSIS — D869 Sarcoidosis, unspecified: Secondary | ICD-10-CM

## 2021-03-31 DIAGNOSIS — R600 Localized edema: Secondary | ICD-10-CM

## 2021-03-31 DIAGNOSIS — R21 Rash and other nonspecific skin eruption: Secondary | ICD-10-CM

## 2021-03-31 NOTE — Telephone Encounter (Signed)
Referrals to ENT and dermatology placed per Hazel Sams, PA-C

## 2021-04-22 ENCOUNTER — Other Ambulatory Visit (HOSPITAL_BASED_OUTPATIENT_CLINIC_OR_DEPARTMENT_OTHER): Payer: Self-pay

## 2021-04-22 ENCOUNTER — Other Ambulatory Visit: Payer: Self-pay | Admitting: Internal Medicine

## 2021-05-06 DIAGNOSIS — G4733 Obstructive sleep apnea (adult) (pediatric): Secondary | ICD-10-CM | POA: Diagnosis not present

## 2021-05-18 NOTE — Progress Notes (Signed)
Office Visit Note  Patient: [name]             Date of Birth: 07-06-1970           MRN: [id number]             PCP: Biagio Borg, MD Referring: Biagio Borg, MD Visit Date: 06/01/2021 Occupation: _0 @  Subjective:  Pain in joints and stiffness  History of Present Illness: [name] is a 51 y.o. female with a history of sarcoidosis and osteoarthritis.  She states her symptoms have improved since she has been taking methotrexate.  She has not noticed any joint swelling.  She continues to have some discomfort in her knee joints especially her right knee joint.  She also has lower back pain.  She developed rash once on her face which lasted for couple of days and resolved.  She denies any history of iritis.  Activities of Daily Living:  Patient reports morning stiffness for 15 minutes.   Patient Reports nocturnal pain.  Difficulty dressing/grooming: Denies Difficulty climbing stairs: Reports Difficulty getting out of chair: Reports Difficulty using hands for taps, buttons, cutlery, and/or writing: Denies  Review of Systems  Constitutional:  Positive for fatigue.  HENT:  Positive for mouth dryness. Negative for mouth sores and nose dryness.   Eyes:  Positive for dryness. Negative for pain and itching.  Respiratory:  Positive for shortness of breath. Negative for difficulty breathing.   Cardiovascular:  Negative for chest pain and palpitations.  Gastrointestinal:  Negative for blood in stool, constipation and diarrhea.  Endocrine: Negative for increased urination.  Genitourinary:  Negative for difficulty urinating.  Musculoskeletal:  Positive for joint pain, joint pain, joint swelling and morning stiffness. Negative for myalgias, muscle tenderness and myalgias.  Skin:  Negative for color change, rash, redness and sensitivity to sunlight.  Allergic/Immunologic: Negative for susceptible to infections.  Neurological:  Positive for numbness, headaches and  parasthesias. Negative for dizziness, memory loss and weakness.  Hematological:  Positive for bruising/bleeding tendency.  Psychiatric/Behavioral:  Negative for depressed mood, confusion and sleep disturbance. The patient is not nervous/anxious.    PMFS History:  Patient Active Problem List   Diagnosis Date Noted   Incisional hernia 12/25/2020   Mediastinal lymphadenopathy    Pulmonary nodules 10/20/2020   Multiple skin nodules 07/24/2020   Viral illness 06/14/2020   Right lumbar radiculopathy 70/14/1030   Periumbilical hernia 13/14/3888   TB lung, latent 05/18/2020   Acute non-recurrent maxillary sinusitis 11/09/2019   HTN (hypertension) 04/14/2019   Mass of left wrist 04/14/2019   External otitis of right ear 04/14/2019   Choledocholithiasis    Abnormal cholangiogram    Cholecystitis with cholelithiasis 07/24/2018   Hyperglycemia 10/16/2017   Hot flash not due to menopause 10/16/2017   Eczema 10/16/2017   Flu-like symptoms 06/20/2017   Cognitive changes 01/30/2017   Disturbed concentration 01/26/2017   Left shoulder pain 11/01/2016   Right low back pain 11/01/2016   Elevated blood pressure, situational 05/05/2016   Surgical menopause 01/29/2016   OSA on CPAP 12/22/2015   Cough 11/26/2015   Capsulitis 10/06/2015   Ganglion cyst of left foot 10/06/2015   Fatigue 09/13/2015   Asthma 09/08/2015   Exertional dyspnea 09/08/2015   Peroneal ganglion cyst 07/20/2015   Low back pain 07/20/2015   Peroneal tendinitis of left lower leg 06/25/2015   Nonallopathic lesion of cervical region 06/25/2015   Wheezing 05/08/2015   Polyarthralgia 05/08/2015   Peripheral edema 03/16/2015   Central  line complication 85/27/7824   Morbid obesity with BMI of 45.0-49.9, adult ([place]) 02/28/2015   Chemotherapy induced neutropenia ([place]) 02/28/2015   Genetic testing 01/05/2015   Chemotherapy induced nausea and vomiting 01/02/2015   Chemotherapy-induced peripheral neuropathy ([place]) 01/02/2015    Premature surgical menopause 12/17/2014   Leukopenia due to antineoplastic chemotherapy ([place]) 12/17/2014   Port-A-Cath in place 12/17/2014   Encounter for antineoplastic chemotherapy 12/17/2014   Myalgia 12/09/2014   Hypersensitivity reaction 12/05/2014   Poor venous access 11/28/2014   Postoperative cellulitis of surgical wound 11/24/2014   Tobacco dependence 11/12/2014   Malignant neoplasm of both ovaries 11/10/2014   Anxiety 11/10/2014   Adnexal mass 11/10/2014   Encounter for well adult exam with abnormal findings 09/10/2014   Hypothyroidism 09/10/2014   Hypersomnolence 09/10/2014   Acute non-recurrent frontal sinusitis 06/24/2014   Right knee pain 06/24/2014   Lower back pain 01/08/2014   EAR PAIN, BILATERAL 12/18/2009   KNEE PAIN, RIGHT 12/18/2009   INGROWN TOENAIL 12/17/2008   BACK PAIN 12/17/2008   Acute bronchospasm 11/11/2008   Depression 11/29/2007   Insomnia 11/29/2007   Allergic rhinitis 04/13/2007   GERD 04/13/2007   Endometriosis determined by laparoscopy 04/13/2007   Migraine headache 01/29/2007    Past Medical History:  Diagnosis Date   Anxiety    Arthritis    Depression    Dyspnea    Elevated blood pressure, situational 05/05/2016   Endometriosis    Exercise-induced asthma 09/08/2015   GERD (gastroesophageal reflux disease)    History of kidney stones    Hypothyroidism    Migraine    Ovarian cancer, bilateral ([place]) 11/28/2014   PONV (postoperative nausea and vomiting)    Sleep apnea    Thyroid disease    Umbilical hernia    woke up while porta cath removed     Family History  Problem Relation Age of Onset   Hypertension Mother    Diabetes Mother    Stroke Mother        Deceased   Heart disease Mother    Osteoarthritis Mother    Atrial fibrillation Father        Living   Stroke Father        optic nerve left eye   Healthy Brother    Diabetes Maternal Aunt    Heart failure Maternal Grandmother    Heart attack Maternal Grandfather     Dementia Paternal Grandmother    Dementia Paternal Grandfather    Past Surgical History:  Procedure Laterality Date   ABDOMINAL HYSTERECTOMY  11/10/2014   at [facility], Exp lap, supracervical hyst, BSO, infracolic omentectomy, lymphadenectomy, aortic lymph node sampling   BRONCHIAL NEEDLE ASPIRATION BIOPSY  12/16/2020   Procedure: BRONCHIAL NEEDLE ASPIRATION BIOPSIES;  Surgeon: Rigoberto Noel, MD;  Location: [facility];  Service: Cardiopulmonary;;   BRONCHIAL WASHINGS  12/16/2020   Procedure: BRONCHIAL WASHINGS;  Surgeon: Rigoberto Noel, MD;  Location: [facility];  Service: Cardiopulmonary;;   CHOLECYSTECTOMY N/A 07/24/2018   Procedure: LAPAROSCOPIC CHOLECYSTECTOMY WITH INTRAOPERATIVE CHOLANGIOGRAM;  Surgeon: Armandina Gemma, MD;  Location: [facility];  Service: General;  Laterality: N/A;   ERCP N/A 07/26/2018   Procedure: ENDOSCOPIC RETROGRADE CHOLANGIOPANCREATOGRAPHY (ERCP);  Surgeon: Ladene Artist, MD;  Location: [facility];  Service: Endoscopy;  Laterality: N/A;   GANGLION CYST EXCISION Right    hand   INCISIONAL HERNIA REPAIR N/A 12/25/2020   Procedure: LAPAROSCOPIC INCISIONAL HERNIA REPAIR WITH MESH;  Surgeon: Kinsinger, Arta Bruce, MD;  Location: [facility];  Service: General;  Laterality: N/A;   IR GENERIC HISTORICAL  05/12/2016   IR REMOVAL TUN ACCESS W/ PORT W/O FL MOD SED 05/12/2016 [facility]   LIPOMA EXCISION Left    ankle   MENISCUS REPAIR Right    Porta a cath removal     PORTA CATH INSERTION     REMOVAL OF STONES  07/26/2018   Procedure: REMOVAL OF STONES;  Surgeon: Ladene Artist, MD;  Location: [facility];  Service: Endoscopy;;   SPHINCTEROTOMY  07/26/2018   Procedure: Joan Mayans;  Surgeon: Ladene Artist, MD;  Location: [facility];  Service: Endoscopy;;   VIDEO BRONCHOSCOPY WITH ENDOBRONCHIAL ULTRASOUND N/A 12/16/2020   Procedure: VIDEO BRONCHOSCOPY WITH ENDOBRONCHIAL ULTRASOUND;  Surgeon: Rigoberto Noel, MD;  Location: [facility];  Service: Cardiopulmonary;   Laterality: N/A;   Social History   Social History Narrative   Lives with boyfriend in a one story home.  Has no children.     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     Education: college.   Right handed    Immunization History  Administered Date(s) Administered   Influenza,inj,Quad PF,6+ Mos 01/02/2014, 03/26/2015, 12/25/2017, 01/21/2020, 01/25/2021   Influenza-Unspecified 02/23/2016, 02/09/2017, 02/12/2020   MMR 02/09/2017, 07/03/2017   PFIZER(Purple Top)SARS-COV-2 Vaccination 08/01/2019, 08/22/2019, 02/22/2020   Tdap 11/07/2017   Tetanus 12/21/2015     Objective: Vital Signs: BP 129/85 (BP Location: Left Wrist, Patient Position: Sitting, Cuff Size: Large)    Pulse 86    Ht $R'5\' 7"'xO$  (1.702 m)    Wt (!) 332 lb 9.6 oz (150.9 kg)    LMP 11/02/2014    BMI 52.09 kg/m    Physical Exam Vitals and nursing note reviewed.  Constitutional:      Appearance: She is well-developed.  HENT:     Head: Normocephalic and atraumatic.  Eyes:     Conjunctiva/sclera: Conjunctivae normal.  Cardiovascular:     Rate and Rhythm: Normal rate and regular rhythm.     Heart sounds: Normal heart sounds.  Pulmonary:     Effort: Pulmonary effort is normal.     Breath sounds: Normal breath sounds.  Abdominal:     General: Bowel sounds are normal.     Palpations: Abdomen is soft.  Musculoskeletal:     Cervical back: Normal range of motion.  Lymphadenopathy:     Cervical: No cervical adenopathy.  Skin:    General: Skin is warm and dry.     Capillary Refill: Capillary refill takes less than 2 seconds.  Neurological:     Mental Status: She is alert and oriented to person, place, and time.  Psychiatric:        Behavior: Behavior normal.     Musculoskeletal Exam: C-spine was in good range of motion.  Shoulder joints, elbow joints, wrist joints, MCPs PIPs and DIPs with good range of motion.  She has some hypermobility in her PIPs.  Hip joints and knee joints in good range of motion.  She has discomfort with range of  motion of her right knee joint without any warmth swelling or effusion.  There was no tenderness over ankles or MTPs.  CDAI Exam: CDAI Score: -- Patient Global: --; Provider Global: -- Swollen: --; Tender: -- Joint Exam 06/01/2021   No joint exam has been documented for this visit   There is currently no information documented on the homunculus. Go to the Rheumatology activity and complete the homunculus joint exam.  Investigation: No additional findings.  Imaging: No results found.  Recent Labs: Lab Results  Component Value Date   WBC 8.7 03/27/2021   HGB 13.3 03/27/2021   PLT 268 03/27/2021   NA 140 03/27/2021   K 3.5 03/27/2021   CL 104 03/27/2021   CO2 29 03/27/2021   GLUCOSE 122 (H) 03/27/2021   BUN 10 03/27/2021   CREATININE 0.72 03/27/2021   BILITOT 0.7 02/18/2021   ALKPHOS 56 06/15/2020   AST 15 02/18/2021   ALT 18 02/18/2021   PROT 6.8 02/18/2021   ALBUMIN 4.3 06/15/2020   CALCIUM 9.5 03/27/2021   GFRAA 110 11/11/2020   QFTBGOLDPLUS NEGATIVE 11/11/2020    Speciality Comments: No specialty comments available.  Procedures:  No procedures performed Allergies: Moxifloxacin, Quinolones, Doxycycline, Escitalopram oxalate, Lisinopril, and Sulfonamide derivatives   Assessment / Plan:     Visit Diagnoses: Sarcoidosis - Progression of mediastinal and hilar lymph nodes on the CT scan which improved after prednisone, subjective history of EN lesions, right parotid swelling.  She is also followed by Dr. Elsworth Soho.  She has not noticed any joint swelling since she has been on increased dose of methotrexate.  She had no synovitis on my examination today.  High risk medication use - Methotrexate 0.39m sq injections once weekly and folic acid 276mby mouth daily. - Plan: CBC with Differential/Platelet, COMPLETE METABOLIC PANEL WITH GFR today and then every 3 months to monitor for drug toxicity.  Information on immunization was given.  She was also advised to stop the methotrexate  in case she develops an infection and resume after infection resolves.  She did experiencing fatigue and nausea with methotrexate.  She takes Zofran which is helpful.  Sialolithiasis - Patient was evaluated in the ED on 03/27/2021 for right-sided facial swelling and pain.  Maxillofacial CT obtained on 03/27/2021: She has been followed by ENT.  Positive ANA (antinuclear antibody) - Positive ANA, positive sicca symptoms and oral ulcers. AVISE lupus index -1.1, ANA 1: 160NS, antiphosphatidylserine IgM positive  Primary osteoarthritis of both knees-she has been experiencing pain and stiffness in her both knee joints especially the right knee.  She gets Visco supplement injections by orthopedics.  Lower extremity muscle strength exercises were emphasized.  She will start swimming.  She has been going to the gym on a regular basis.  I will refer her to water therapy for lower extremity muscle strengthening.  Right lumbar radiculopathy-she continues to have some lower back pain.  She is going to the gym on a regular basis.  Have also referred her to water therapy.  Rash and other nonspecific skin eruption-she had a recent rash on her face which resolved.  Malignant neoplasm of both ovaries ([facility]- 11/09/2014 TAHBSO, CTX X 6 months.  Patient states that she was released by her oncologist to start on immunosuppressive therapy.  Chemotherapy-induced peripheral neuropathy ([facility])-she continues to have neuropathy.  History of gastroesophageal reflux (GERD)  Primary hypertension  Migraine without aura and with status migrainosus, not intractable  Acquired hypothyroidism  Mild intermittent asthma with acute exacerbation  OSA on CPAP  Orders: Orders Placed This Encounter  Procedures   CBC with Differential/Platelet   COMPLETE METABOLIC PANEL WITH GFR   Meds ordered this encounter  Medications   ondansetron (ZOFRAN) 4 MG tablet    Sig: Take 1 tablet (4 mg total) by mouth every 6 (six) hours as  needed for nausea or vomiting.    Dispense:  30 tablet    Refill:  1   .  Follow-Up Instructions: Return in about 5 months (around 10/30/2021) for Sarcoidosis.  Bo Merino, MD  Note - This record has been created using Editor, commissioning.  Chart creation errors have been sought, but may not always  have been located. Such creation errors do not reflect on  the standard of medical care.

## 2021-05-26 ENCOUNTER — Other Ambulatory Visit: Payer: Self-pay | Admitting: Internal Medicine

## 2021-05-26 ENCOUNTER — Other Ambulatory Visit (HOSPITAL_BASED_OUTPATIENT_CLINIC_OR_DEPARTMENT_OTHER): Payer: Self-pay

## 2021-05-26 MED ORDER — TRAZODONE HCL 50 MG PO TABS
25.0000 mg | ORAL_TABLET | Freq: Every evening | ORAL | 0 refills | Status: DC | PRN
  Filled 2021-05-26: qty 90, 90d supply, fill #0

## 2021-06-01 ENCOUNTER — Other Ambulatory Visit (HOSPITAL_BASED_OUTPATIENT_CLINIC_OR_DEPARTMENT_OTHER): Payer: Self-pay

## 2021-06-01 ENCOUNTER — Encounter: Payer: Self-pay | Admitting: Rheumatology

## 2021-06-01 ENCOUNTER — Telehealth: Payer: Self-pay | Admitting: Rheumatology

## 2021-06-01 ENCOUNTER — Other Ambulatory Visit: Payer: Self-pay

## 2021-06-01 ENCOUNTER — Ambulatory Visit: Payer: 59 | Admitting: Rheumatology

## 2021-06-01 VITALS — BP 129/85 | HR 86 | Ht 67.0 in | Wt 332.6 lb

## 2021-06-01 DIAGNOSIS — G620 Drug-induced polyneuropathy: Secondary | ICD-10-CM | POA: Diagnosis not present

## 2021-06-01 DIAGNOSIS — D869 Sarcoidosis, unspecified: Secondary | ICD-10-CM

## 2021-06-01 DIAGNOSIS — M170 Bilateral primary osteoarthritis of knee: Secondary | ICD-10-CM

## 2021-06-01 DIAGNOSIS — C563 Malignant neoplasm of bilateral ovaries: Secondary | ICD-10-CM

## 2021-06-01 DIAGNOSIS — G4733 Obstructive sleep apnea (adult) (pediatric): Secondary | ICD-10-CM

## 2021-06-01 DIAGNOSIS — E039 Hypothyroidism, unspecified: Secondary | ICD-10-CM

## 2021-06-01 DIAGNOSIS — K115 Sialolithiasis: Secondary | ICD-10-CM | POA: Diagnosis not present

## 2021-06-01 DIAGNOSIS — Z8719 Personal history of other diseases of the digestive system: Secondary | ICD-10-CM

## 2021-06-01 DIAGNOSIS — M5416 Radiculopathy, lumbar region: Secondary | ICD-10-CM

## 2021-06-01 DIAGNOSIS — G43001 Migraine without aura, not intractable, with status migrainosus: Secondary | ICD-10-CM

## 2021-06-01 DIAGNOSIS — G8929 Other chronic pain: Secondary | ICD-10-CM

## 2021-06-01 DIAGNOSIS — R768 Other specified abnormal immunological findings in serum: Secondary | ICD-10-CM

## 2021-06-01 DIAGNOSIS — Z79899 Other long term (current) drug therapy: Secondary | ICD-10-CM | POA: Diagnosis not present

## 2021-06-01 DIAGNOSIS — R21 Rash and other nonspecific skin eruption: Secondary | ICD-10-CM

## 2021-06-01 DIAGNOSIS — J4521 Mild intermittent asthma with (acute) exacerbation: Secondary | ICD-10-CM

## 2021-06-01 DIAGNOSIS — T451X5A Adverse effect of antineoplastic and immunosuppressive drugs, initial encounter: Secondary | ICD-10-CM

## 2021-06-01 DIAGNOSIS — M545 Low back pain, unspecified: Secondary | ICD-10-CM

## 2021-06-01 DIAGNOSIS — Z9989 Dependence on other enabling machines and devices: Secondary | ICD-10-CM

## 2021-06-01 DIAGNOSIS — I10 Essential (primary) hypertension: Secondary | ICD-10-CM

## 2021-06-01 LAB — COMPLETE METABOLIC PANEL WITH GFR
AG Ratio: 1.7 (calc) (ref 1.0–2.5)
ALT: 21 U/L (ref 6–29)
AST: 18 U/L (ref 10–35)
Albumin: 4.9 g/dL (ref 3.6–5.1)
Alkaline phosphatase (APISO): 56 U/L (ref 37–153)
BUN: 10 mg/dL (ref 7–25)
CO2: 34 mmol/L — ABNORMAL HIGH (ref 20–32)
Calcium: 9.6 mg/dL (ref 8.6–10.4)
Chloride: 101 mmol/L (ref 98–110)
Creat: 0.79 mg/dL (ref 0.50–1.03)
Globulin: 2.9 g/dL (calc) (ref 1.9–3.7)
Glucose, Bld: 89 mg/dL (ref 65–99)
Potassium: 3.9 mmol/L (ref 3.5–5.3)
Sodium: 138 mmol/L (ref 135–146)
Total Bilirubin: 0.8 mg/dL (ref 0.2–1.2)
Total Protein: 7.8 g/dL (ref 6.1–8.1)
eGFR: 91 mL/min/{1.73_m2} (ref >=60)

## 2021-06-01 LAB — CBC WITH DIFFERENTIAL/PLATELET
Absolute Monocytes: 557 cells/uL (ref 200–950)
Basophils Absolute: 23 cells/uL (ref 0–200)
Basophils Relative: 0.2 %
Eosinophils Absolute: 197 cells/uL (ref 15–500)
Eosinophils Relative: 1.7 %
HCT: 42.8 % (ref 35.0–45.0)
Hemoglobin: 14.5 g/dL (ref 11.7–15.5)
Lymphs Abs: 3086 cells/uL (ref 850–3900)
MCH: 30.7 pg (ref 27.0–33.0)
MCHC: 33.9 g/dL (ref 32.0–36.0)
MCV: 90.7 fL (ref 80.0–100.0)
MPV: 10.4 fL (ref 7.5–12.5)
Monocytes Relative: 4.8 %
Neutro Abs: 7737 cells/uL (ref 1500–7800)
Neutrophils Relative %: 66.7 %
Platelets: 344 10*3/uL (ref 140–400)
RBC: 4.72 10*6/uL (ref 3.80–5.10)
RDW: 12.9 % (ref 11.0–15.0)
Total Lymphocyte: 26.6 %
WBC: 11.6 10*3/uL — ABNORMAL HIGH (ref 3.8–10.8)

## 2021-06-01 MED ORDER — ONDANSETRON HCL 4 MG PO TABS
4.0000 mg | ORAL_TABLET | Freq: Four times a day (QID) | ORAL | 1 refills | Status: DC | PRN
  Filled 2021-06-01: qty 30, 8d supply, fill #0
  Filled 2021-11-08: qty 30, 8d supply, fill #1

## 2021-06-01 NOTE — Telephone Encounter (Signed)
Please refer to water therapy at [facility] for chronic lower back pain and osteoarthritis of the knee joints.

## 2021-06-01 NOTE — Telephone Encounter (Signed)
During check-out patient stated water therapy at [facility] was discussed during her appointment and she told Dr. Estanislado Pandy that she wasn't interested but has now changed her mind. She would like a referral if that's necessary to begin therapy.

## 2021-06-01 NOTE — Patient Instructions (Signed)
Standing Labs We placed an order today for your standing lab work.   Please have your standing labs drawn in April and every 3 months  If possible, please have your labs drawn 2 weeks prior to your appointment so that the provider can discuss your results at your appointment.  Please note that you may see your imaging and lab results in [facility] before we have reviewed them. We may be awaiting multiple results to interpret others before contacting you. Please allow our office up to 72 hours to thoroughly review all of the results before contacting the office for clarification of your results.  We have open lab daily: Monday through Thursday from 1:30-4:30 PM and Friday from 1:30-4:00 PM at the office of Dr. Bo Merino, [facility].   Please be advised, all patients with office appointments requiring lab work will take precedent over walk-in lab work.  If possible, please come for your lab work on Monday and Friday afternoons, as you may experience shorter wait times. The office is located at [address] [place], [place], [place] [address] No appointment is necessary.   Labs are drawn by [facility]. Please bring your co-pay at the time of your lab draw.  You may receive a bill from [facility] for your lab work.  Please note if you are on Hydroxychloroquine and and an order has been placed for a Hydroxychloroquine level, you will need to have it drawn 4 hours or more after your last dose.  If you wish to have your labs drawn at another location, please call the office 24 hours in advance to send orders.  If you have any questions regarding directions or hours of operation,  please call [phone].   As a reminder, please drink plenty of water prior to coming for your lab work. Thanks!   Vaccines You are taking a medication(s) that can suppress your immune system.  The following immunizations are recommended: Flu annually Covid-19  Td/Tdap (tetanus, diphtheria, pertussis)  every 10 years Pneumonia (Prevnar 15 then Pneumovax 23 at least 1 year apart.  Alternatively, can take Prevnar 20 without needing additional dose) Shingrix: 2 doses from 4 weeks to 6 months apart  Please check with your PCP to make sure you are up to date.  If you have signs or symptoms of an infection or start antibiotics: First, call your PCP for workup of your infection. Hold your medication through the infection, until you complete your antibiotics, and until symptoms resolve if you take the following: Injectable medication (Actemra, Benlysta, Cimzia, Cosentyx, Enbrel, Humira, Kevzara, Orencia, Remicade, Simponi, Stelara, Taltz, Tremfya) Methotrexate Leflunomide (Arava) Mycophenolate (Cellcept) Roma Kayser, or Rinvoq  Hand Exercises Hand exercises can be helpful for almost anyone. These exercises can strengthen the hands, improve flexibility and movement, and increase blood flow to the hands. These results can make work and daily tasks easier. Hand exercises can be especially helpful for people who have joint pain from arthritis or have nerve damage from overuse (carpal tunnel syndrome). These exercises can also help people who have injured a hand. Exercises Most of these hand exercises are gentle stretching and motion exercises. It is usually safe to do them often throughout the day. Warming up your hands before exercise may help to reduce stiffness. You can do this with gentle massage or by placing your hands in warm water for 10-15 minutes. It is normal to feel some stretching, pulling, tightness, or mild discomfort as you begin new exercises. This will gradually improve. Stop  an exercise right away if you feel sudden, severe pain or your pain gets worse. Ask your health care provider which exercises are best for you. Knuckle bend or "claw" fist  Stand or sit with your arm, hand, and all five fingers pointed straight up. Make sure to keep your wrist straight during the  exercise. Gently bend your fingers down toward your palm until the tips of your fingers are touching the top of your palm. Keep your big knuckle straight and just bend the small knuckles in your fingers. Hold this position for __________ seconds. Straighten (extend) your fingers back to the starting position. Repeat this exercise 5-10 times with each hand. Full finger fist  Stand or sit with your arm, hand, and all five fingers pointed straight up. Make sure to keep your wrist straight during the exercise. Gently bend your fingers into your palm until the tips of your fingers are touching the middle of your palm. Hold this position for __________ seconds. Extend your fingers back to the starting position, stretching every joint fully. Repeat this exercise 5-10 times with each hand. Straight fist Stand or sit with your arm, hand, and all five fingers pointed straight up. Make sure to keep your wrist straight during the exercise. Gently bend your fingers at the big knuckle, where your fingers meet your hand, and the middle knuckle. Keep the knuckle at the tips of your fingers straight and try to touch the bottom of your palm. Hold this position for __________ seconds. Extend your fingers back to the starting position, stretching every joint fully. Repeat this exercise 5-10 times with each hand. Tabletop  Stand or sit with your arm, hand, and all five fingers pointed straight up. Make sure to keep your wrist straight during the exercise. Gently bend your fingers at the big knuckle, where your fingers meet your hand, as far down as you can while keeping the small knuckles in your fingers straight. Think of forming a tabletop with your fingers. Hold this position for __________ seconds. Extend your fingers back to the starting position, stretching every joint fully. Repeat this exercise 5-10 times with each hand. Finger spread  Place your hand flat on a table with your palm facing down. Make  sure your wrist stays straight as you do this exercise. Spread your fingers and thumb apart from each other as far as you can until you feel a gentle stretch. Hold this position for __________ seconds. Bring your fingers and thumb tight together again. Hold this position for __________ seconds. Repeat this exercise 5-10 times with each hand. Making circles  Stand or sit with your arm, hand, and all five fingers pointed straight up. Make sure to keep your wrist straight during the exercise. Make a circle by touching the tip of your thumb to the tip of your index finger. Hold for __________ seconds. Then open your hand wide. Repeat this motion with your thumb and each finger on your hand. Repeat this exercise 5-10 times with each hand. Thumb motion  Sit with your forearm resting on a table and your wrist straight. Your thumb should be facing up toward the ceiling. Keep your fingers relaxed as you move your thumb. Lift your thumb up as high as you can toward the ceiling. Hold for __________ seconds. Bend your thumb across your palm as far as you can, reaching the tip of your thumb for the small finger (pinkie) side of your palm. Hold for __________ seconds. Repeat this exercise 5-10 times with  each hand. Grip strengthening  Hold a stress ball or other soft ball in the middle of your hand. Slowly increase the pressure, squeezing the ball as much as you can without causing pain. Think of bringing the tips of your fingers into the middle of your palm. All of your finger joints should bend when doing this exercise. Hold your squeeze for __________ seconds, then relax. Repeat this exercise 5-10 times with each hand. Contact a health care provider if: Your hand pain or discomfort gets much worse when you do an exercise. Your hand pain or discomfort does not improve within 2 hours after you exercise. If you have any of these problems, stop doing these exercises right away. Do not do them again unless  your health care provider says that you can. Get help right away if: You develop sudden, severe hand pain or swelling. If this happens, stop doing these exercises right away. Do not do them again unless your health care provider says that you can. This information is not intended to replace advice given to you by your health care provider. Make sure you discuss any questions you have with your health care provider. Document Revised: 08/13/2020 Document Reviewed: 08/13/2020 Elsevier Patient Education  [place].

## 2021-06-02 NOTE — Addendum Note (Signed)
Addended by: Carole Binning on: 06/02/2021 10:43 AM   Modules accepted: Orders

## 2021-06-02 NOTE — Telephone Encounter (Signed)
Referral placed. Patient advised. 

## 2021-06-02 NOTE — Progress Notes (Signed)
White cell count is mildly elevated and stable.  CMP is normal.

## 2021-06-07 ENCOUNTER — Telehealth: Payer: Self-pay | Admitting: *Deleted

## 2021-06-07 ENCOUNTER — Ambulatory Visit: Payer: 59 | Admitting: Podiatry

## 2021-06-07 ENCOUNTER — Other Ambulatory Visit: Payer: Self-pay

## 2021-06-07 DIAGNOSIS — M21612 Bunion of left foot: Secondary | ICD-10-CM | POA: Diagnosis not present

## 2021-06-07 DIAGNOSIS — L600 Ingrowing nail: Secondary | ICD-10-CM

## 2021-06-07 DIAGNOSIS — R609 Edema, unspecified: Secondary | ICD-10-CM | POA: Diagnosis not present

## 2021-06-07 DIAGNOSIS — M79675 Pain in left toe(s): Secondary | ICD-10-CM

## 2021-06-07 MED ORDER — CEPHALEXIN 500 MG PO CAPS: 500.0000 mg | ORAL_CAPSULE | Freq: Three times a day (TID) | ORAL | 0 refills | Status: DC

## 2021-06-07 NOTE — Patient Instructions (Signed)

## 2021-06-07 NOTE — Telephone Encounter (Signed)
Patient is calling for status on an antibiotic that was supposed to be sent to pharmacy. Returned the call back to patient and informed that the prescription has been sent to pharmacy on file, verbalized understanding.

## 2021-06-09 NOTE — Progress Notes (Signed)
Subjective:   Patient ID: [name], female   DOB: 51 y.o.   MRN: [id number]   HPI 51 year old female presents today for concerns of ingrown toenail.  Total discharge soaking intracardial herself.  Has been soaking in salt water as well as hydrogen peroxide.  She also has a bunion on her left foot.  Visibly worse over the last 1 year.  No recent treatment.  No injury.  Review of Systems  All other systems reviewed and are negative.  Past Medical History:  Diagnosis Date   Anxiety    Arthritis    Depression    Dyspnea    Elevated blood pressure, situational 05/05/2016   Endometriosis    Exercise-induced asthma 09/08/2015   GERD (gastroesophageal reflux disease)    History of kidney stones    Hypothyroidism    Migraine    Ovarian cancer, bilateral ([place]) 11/28/2014   PONV (postoperative nausea and vomiting)    Sleep apnea    Thyroid disease    Umbilical hernia    woke up while porta cath removed     Past Surgical History:  Procedure Laterality Date   ABDOMINAL HYSTERECTOMY  11/10/2014   at [facility], Exp lap, supracervical hyst, BSO, infracolic omentectomy, lymphadenectomy, aortic lymph node sampling   BRONCHIAL NEEDLE ASPIRATION BIOPSY  12/16/2020   Procedure: BRONCHIAL NEEDLE ASPIRATION BIOPSIES;  Surgeon: Rigoberto Noel, MD;  Location: [facility];  Service: Cardiopulmonary;;   BRONCHIAL WASHINGS  12/16/2020   Procedure: BRONCHIAL WASHINGS;  Surgeon: Rigoberto Noel, MD;  Location: [facility];  Service: Cardiopulmonary;;   CHOLECYSTECTOMY N/A 07/24/2018   Procedure: LAPAROSCOPIC CHOLECYSTECTOMY WITH INTRAOPERATIVE CHOLANGIOGRAM;  Surgeon: Armandina Gemma, MD;  [facility];  Service: General;  Laterality: N/A;   ERCP N/A 07/26/2018   Procedure: ENDOSCOPIC RETROGRADE CHOLANGIOPANCREATOGRAPHY (ERCP);  Surgeon: Ladene Artist, MD;  Location: [facility];  Service: Endoscopy;  Laterality: N/A;   GANGLION CYST EXCISION Right    hand   INCISIONAL HERNIA REPAIR N/A 12/25/2020    Procedure: LAPAROSCOPIC INCISIONAL HERNIA REPAIR WITH MESH;  Surgeon: Kinsinger, Arta Bruce, MD;  [facility];  Service: General;  Laterality: N/A;   IR GENERIC HISTORICAL  05/12/2016   IR REMOVAL TUN ACCESS W/ PORT W/O FL MOD SED 05/12/2016 [facility]   LIPOMA EXCISION Left    ankle   MENISCUS REPAIR Right    Porta a cath removal     PORTA CATH INSERTION     REMOVAL OF STONES  07/26/2018   Procedure: REMOVAL OF STONES;  Surgeon: Ladene Artist, MD;  Location: [facility];  Service: Endoscopy;;   SPHINCTEROTOMY  07/26/2018   Procedure: Joan Mayans;  Surgeon: Ladene Artist, MD;  Location: [facility];  Service: Endoscopy;;   VIDEO BRONCHOSCOPY WITH ENDOBRONCHIAL ULTRASOUND N/A 12/16/2020   Procedure: VIDEO BRONCHOSCOPY WITH ENDOBRONCHIAL ULTRASOUND;  Surgeon: Rigoberto Noel, MD;  Location: [facility];  Service: Cardiopulmonary;  Laterality: N/A;     Current Outpatient Medications:    cephALEXin (KEFLEX) 500 MG capsule, Take 1 capsule (500 mg total) by mouth 3 (three) times daily., Disp: 21 capsule, Rfl: 0   acetaminophen (TYLENOL) 650 MG CR tablet, Take 1,300 mg by mouth daily as needed for pain., Disp: , Rfl:    ALPRAZolam (XANAX) 1 MG tablet, Take 1 tablet (1 mg total) by mouth 2 (two) times daily as needed for anxiety., Disp: 60 tablet, Rfl: 2   amoxicillin-clavulanate (AUGMENTIN) 875-125 MG tablet, Take 1 tablet by mouth 2 (two) times daily. (Patient not  taking: Reported on 03/30/2021), Disp: , Rfl:    benzonatate (TESSALON) 100 MG capsule, 1 - 2 TABS BY MOUTH THREE TIMES DAILY AS NEEDED FOR COUGH (Patient not taking: Reported on 06/01/2021), Disp: 60 capsule, Rfl: 2   Blood Pressure Monitoring (OMRON 3 SERIES BP MONITOR) DEVI, Use as directed., Disp: 1 each, Rfl: 0   clobetasol cream (TEMOVATE) 7.74 %, Apply 1 application topically 2 (two) times daily as needed., Disp: 45 g, Rfl: 0   cyclobenzaprine (FLEXERIL) 5 MG tablet, Take 1 tablet (5 mg total) by mouth at bedtime  as needed for muscle spasms., Disp: 90 tablet, Rfl: 3   estradiol (VIVELLE-DOT) 0.1 MG/24HR patch, Place 1 patch (0.1 mg total) onto the skin 2 (two) times a week., Disp: 8 patch, Rfl: 12   famotidine (PEPCID) 20 MG tablet, Take 1 tablet (20 mg total) by mouth 2 (two) times daily, Disp: 60 tablet, Rfl: 11   fluticasone (FLONASE) 50 MCG/ACT nasal spray, Place 2 sprays into both nostrils daily. In each nostril (Patient not taking: Reported on 06/01/2021), Disp: 16 g, Rfl: 1   Fluticasone-Salmeterol (ADVAIR DISKUS) 250-50 MCG/DOSE AEPB, Inhale 1 puff into the lungs 2 (two) times daily. (Patient not taking: Reported on 06/01/2021), Disp: 1 each, Rfl: 3   folic acid (FOLVITE) 1 MG tablet, Take 2 tablets (2 mg total) by mouth daily., Disp: 180 tablet, Rfl: 3   gabapentin (NEURONTIN) 600 MG tablet, Take 1 tablet (600 mg total) by mouth 3 (three) times daily., Disp: 270 tablet, Rfl: 3   hydroxypropyl methylcellulose / hypromellose (ISOPTO TEARS / GONIOVISC) 2.5 % ophthalmic solution, Place 1 drop into both eyes daily as needed for dry eyes., Disp: , Rfl:    ibuprofen (ADVIL) 800 MG tablet, Take 1 tablet (800 mg total) by mouth every 8 (eight) hours as needed., Disp: 50 tablet, Rfl: 0   levothyroxine (SYNTHROID) 75 MCG tablet, TAKE 1 TABLET BY MOUTH EVERY DAY (Patient taking differently: Take 75 mcg by mouth daily before breakfast.), Disp: 30 tablet, Rfl: 0   losartan (COZAAR) 25 MG tablet, Take 1 tablet (25 mg total) by mouth daily., Disp: 90 tablet, Rfl: 1   methotrexate (RHEUMATREX) 2.5 MG tablet, Take 4 tablets (10 mg total) by mouth once a week. Caution: Chemotherapy. Protect from light. (Patient not taking: Reported on 03/30/2021), Disp: 32 tablet, Rfl: 0   methotrexate 50 MG/2ML injection, Inject 0.8 mLs (20 mg total) into the skin once a week., Disp: 10 mL, Rfl: 0   omeprazole (PRILOSEC) 20 MG capsule, Take 20 mg by mouth daily. (Patient not taking: Reported on 06/01/2021), Disp: , Rfl:    ondansetron  (ZOFRAN) 4 MG tablet, Take 1 tablet (4 mg total) by mouth every 6 (six) hours as needed for nausea or vomiting., Disp: 30 tablet, Rfl: 1   sertraline (ZOLOFT) 100 MG tablet, TAKE 2 TABLETS (200 MG TOTAL) BY MOUTH DAILY. (Patient taking differently: Take 100 mg by mouth in the morning and at bedtime.), Disp: 180 tablet, Rfl: 3   sodium chloride (OCEAN) 0.65 % SOLN nasal spray, Place 1 spray into both nostrils daily as needed for congestion., Disp: , Rfl:    traZODone (DESYREL) 50 MG tablet, Take 0.5-1 tablets (25-50 mg total) by mouth at bedtime as needed for sleep., Disp: 90 tablet, Rfl: 0   TUBERCULIN SYR 1CC/27GX1/2" (B-D TB SYRINGE 1CC/27GX1/2") 27G X 1/2" 1 ML MISC, Use once a week., Disp: 12 each, Rfl: 3  Allergies  Allergen Reactions   Moxifloxacin Anaphylaxis  needs epinephrine shot   Quinolones Anaphylaxis, Shortness Of Breath and Swelling   Doxycycline Nausea And Vomiting   Escitalopram Oxalate Other (See Comments)     fatigue   Lisinopril Cough   Sulfonamide Derivatives Swelling and Rash    needs epinephrine shot--rash and lip swelling          Objective:  Physical Exam  General: AAO x3, NAD  Dermatological: Incurvation present to the left hallux toenail both the medial lateral aspect localized edema but there is no drainage or pus identified today.  No open lesions.  Vascular: Dorsalis Pedis artery and Posterior Tibial artery pedal pulses are 2/4 bilateral with immedate capillary fill time.  There is no pain with calf compression, swelling, warmth, erythema.   Neruologic: Grossly intact via light touch bilateral.  Musculoskeletal: There is no documentation of or restriction with first digit range of motion.  Mild erythema on the medial first metatarsal head on the bunion.  No skin breakdown or warmth.  Sensation is good correction present to lateral aspect ankle.  Muscular strength 5/5 in all groups tested bilateral.  Gait: Unassisted, Nonantalgic.        Assessment:   Left hallux ingrown toenail, left foot bunion     Plan:  -Treatment options discussed including all alternatives, risks, and complications -Etiology of symptoms were discussed -At this time, the patient is requesting partial nail removal with chemical matricectomy to the symptomatic portion of the nail. Risks and complications were discussed with the patient for which they understand and written consent was obtained. Under sterile conditions a total of 3 mL of a mixture of 2% lidocaine plain and 0.5% Marcaine plain was infiltrated in a hallux block fashion. Once anesthetized, the skin was prepped in sterile fashion. A tourniquet was then applied. Next the medial and lateral aspect of hallux nail border was then sharply excised making sure to remove the entire offending nail border. Once the nails were ensured to be removed area was debrided and the underlying skin was intact. There is no purulence identified in the procedure. Next phenol was then applied under standard conditions and copiously irrigated.  Betadine ointment was applied. A dry sterile dressing was applied. After application of the dressing the tourniquet was removed and there is found to be an immediate capillary refill time to the digit. The patient tolerated the procedure well any complications. Post procedure instructions were discussed the patient for which he verbally understood. Follow-up in one week for nail check or sooner if any problems are to arise. Discussed signs/symptoms of infection and directed to call the office immediately should any occur or go directly to the emergency room. In the meantime, encouraged to call the office with any questions, concerns, changes symptoms. -Given the soft tissue mass that she also has chronic swelling to her legs discussed compression socks which I would start after the procedure heals. -Given the bunion discussed shoe modifications, offloading.  If symptoms continue will  x-ray.  Trula Slade DPM

## 2021-06-14 ENCOUNTER — Other Ambulatory Visit: Payer: Self-pay | Admitting: Internal Medicine

## 2021-06-14 ENCOUNTER — Ambulatory Visit (HOSPITAL_BASED_OUTPATIENT_CLINIC_OR_DEPARTMENT_OTHER): Payer: 59 | Admitting: Physical Therapy

## 2021-06-15 NOTE — Telephone Encounter (Signed)
Please refill as per office routine med refill policy (all routine meds to be refilled for 3 mo or monthly (per pt preference) up to one year from last visit, then month to month grace period for 3 mo, then further med refills will have to be denied) ? ?

## 2021-06-16 ENCOUNTER — Other Ambulatory Visit: Payer: Self-pay | Admitting: Internal Medicine

## 2021-06-16 ENCOUNTER — Other Ambulatory Visit (HOSPITAL_BASED_OUTPATIENT_CLINIC_OR_DEPARTMENT_OTHER): Payer: Self-pay

## 2021-06-16 MED ORDER — LOSARTAN POTASSIUM 25 MG PO TABS
25.0000 mg | ORAL_TABLET | Freq: Every day | ORAL | 0 refills | Status: DC
  Filled 2021-06-16: qty 30, 30d supply, fill #0

## 2021-06-16 NOTE — Telephone Encounter (Signed)
Please refill as per office routine med refill policy (all routine meds to be refilled for 3 mo or monthly (per pt preference) up to one year from last visit, then month to month grace period for 3 mo, then further med refills will have to be denied) ? ?

## 2021-06-21 ENCOUNTER — Ambulatory Visit: Payer: 59 | Admitting: Podiatry

## 2021-06-21 ENCOUNTER — Other Ambulatory Visit: Payer: Self-pay

## 2021-06-21 DIAGNOSIS — M778 Other enthesopathies, not elsewhere classified: Secondary | ICD-10-CM

## 2021-06-21 DIAGNOSIS — M21612 Bunion of left foot: Secondary | ICD-10-CM

## 2021-06-21 DIAGNOSIS — L600 Ingrowing nail: Secondary | ICD-10-CM | POA: Diagnosis not present

## 2021-06-22 ENCOUNTER — Other Ambulatory Visit (HOSPITAL_BASED_OUTPATIENT_CLINIC_OR_DEPARTMENT_OTHER): Payer: Self-pay

## 2021-06-22 MED ORDER — ZOSTER VAC RECOMB ADJUVANTED 50 MCG/0.5ML IM SUSR
INTRAMUSCULAR | 1 refills | Status: DC
  Filled 2021-06-22: qty 0.5, 1d supply, fill #0

## 2021-06-25 ENCOUNTER — Ambulatory Visit (INDEPENDENT_AMBULATORY_CARE_PROVIDER_SITE_OTHER): Payer: 59 | Admitting: Internal Medicine

## 2021-06-25 ENCOUNTER — Other Ambulatory Visit: Payer: Self-pay

## 2021-06-25 ENCOUNTER — Other Ambulatory Visit (HOSPITAL_BASED_OUTPATIENT_CLINIC_OR_DEPARTMENT_OTHER): Payer: Self-pay

## 2021-06-25 VITALS — BP 122/76 | HR 86 | Temp 98.9°F | Ht 67.0 in | Wt 332.1 lb

## 2021-06-25 DIAGNOSIS — I10 Essential (primary) hypertension: Secondary | ICD-10-CM

## 2021-06-25 DIAGNOSIS — E039 Hypothyroidism, unspecified: Secondary | ICD-10-CM

## 2021-06-25 DIAGNOSIS — Z1211 Encounter for screening for malignant neoplasm of colon: Secondary | ICD-10-CM | POA: Diagnosis not present

## 2021-06-25 DIAGNOSIS — Z0001 Encounter for general adult medical examination with abnormal findings: Secondary | ICD-10-CM | POA: Diagnosis not present

## 2021-06-25 DIAGNOSIS — I872 Venous insufficiency (chronic) (peripheral): Secondary | ICD-10-CM

## 2021-06-25 DIAGNOSIS — E538 Deficiency of other specified B group vitamins: Secondary | ICD-10-CM

## 2021-06-25 DIAGNOSIS — Z1231 Encounter for screening mammogram for malignant neoplasm of breast: Secondary | ICD-10-CM | POA: Diagnosis not present

## 2021-06-25 DIAGNOSIS — E559 Vitamin D deficiency, unspecified: Secondary | ICD-10-CM

## 2021-06-25 DIAGNOSIS — M25561 Pain in right knee: Secondary | ICD-10-CM

## 2021-06-25 DIAGNOSIS — J4521 Mild intermittent asthma with (acute) exacerbation: Secondary | ICD-10-CM | POA: Diagnosis not present

## 2021-06-25 DIAGNOSIS — R739 Hyperglycemia, unspecified: Secondary | ICD-10-CM

## 2021-06-25 DIAGNOSIS — F419 Anxiety disorder, unspecified: Secondary | ICD-10-CM | POA: Diagnosis not present

## 2021-06-25 DIAGNOSIS — G8929 Other chronic pain: Secondary | ICD-10-CM

## 2021-06-25 LAB — URINALYSIS, ROUTINE W REFLEX MICROSCOPIC
Bilirubin Urine: NEGATIVE
Hgb urine dipstick: NEGATIVE
Ketones, ur: NEGATIVE
Leukocytes,Ua: NEGATIVE
Nitrite: NEGATIVE
Specific Gravity, Urine: 1.02 (ref 1.000–1.030)
Total Protein, Urine: NEGATIVE
Urine Glucose: NEGATIVE
Urobilinogen, UA: 1 (ref 0.0–1.0)
pH: 6 (ref 5.0–8.0)

## 2021-06-25 LAB — HEMOGLOBIN A1C: Hgb A1c MFr Bld: 5.8 % (ref 4.6–6.5)

## 2021-06-25 LAB — LIPID PANEL
Cholesterol: 159 mg/dL (ref 0–200)
HDL: 38.9 mg/dL — ABNORMAL LOW (ref >39.00)
LDL Cholesterol: 92 mg/dL (ref 0–99)
NonHDL: 120.41
Total CHOL/HDL Ratio: 4
Triglycerides: 140 mg/dL (ref 0.0–149.0)
VLDL: 28 mg/dL (ref 0.0–40.0)

## 2021-06-25 LAB — VITAMIN B12: Vitamin B-12: 626 pg/mL (ref 211–911)

## 2021-06-25 LAB — VITAMIN D 25 HYDROXY (VIT D DEFICIENCY, FRACTURES): VITD: 13.51 ng/mL — ABNORMAL LOW (ref 30.00–100.00)

## 2021-06-25 LAB — TSH: TSH: 7.3 u[IU]/mL — ABNORMAL HIGH (ref 0.35–5.50)

## 2021-06-25 MED ORDER — FLUTICASONE-SALMETEROL 250-50 MCG/ACT IN AEPB
1.0000 | INHALATION_SPRAY | Freq: Two times a day (BID) | RESPIRATORY_TRACT | 3 refills | Status: DC
  Filled 2021-06-25: qty 180, 90d supply, fill #0
  Filled 2021-09-27: qty 180, 90d supply, fill #1
  Filled 2021-12-20: qty 180, 90d supply, fill #2
  Filled 2022-04-24: qty 180, 90d supply, fill #3

## 2021-06-25 NOTE — Patient Instructions (Signed)
Please continue all other medications as before, and refills have been done if requested - advair  Please have the pharmacy call with any other refills you may need.  Please continue your efforts at being more active, low cholesterol diet, and weight control.  You are otherwise up to date with prevention measures today.  Please keep your appointments with your specialists as you may have planned  You will be contacted regarding the referral for: weight management clinic, mammogram and colonoscopy  Please go to the LAB at the blood drawing area for the tests to be done  You will be contacted by phone if any changes need to be made immediately.  Otherwise, you will receive a letter about your results with an explanation, but please check with MyChart first.  Please remember to sign up for MyChart if you have not done so, as this will be important to you in the future with finding out test results, communicating by private email, and scheduling acute appointments online when needed.  Please make an Appointment to return in 6 months, or sooner if needed

## 2021-06-25 NOTE — Progress Notes (Signed)
Patient ID: [name], female   DOB: 1971-03-29, 51 y.o.   MRN: [id number]         Chief Complaint:: wellness exam and Annual Exam, Edema (Left leg and left arm swelling ), and Medication Refill (Advair)  And asthma, obesity, right knee pain, and hyperglycemia       HPI:  [name] is a 51 y.o. 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  Unfortunately conts to gain wt with excess calories though she has been going to the gym 3 times per wk. Cant seem to los wt this way    Right knee pain getting worse now chronic severe, intermittent, worse to walk better to sit, no giveaways or falls, has been followed per ortho with gel shots..  Pt denies chest pain, increased sob or doe, wheezing, orthopnea, PND,  palpitations, dizziness or syncope, though has seen mild increase venous type swelling to LEs below the knees left > right, worse later in the day, resolves at night with lying down. .   Pt denies polydipsia, polyuria, or new focal neuro s.s    Pt denies fever, wt loss, night sweats, loss of appetite, or other constitutional symptoms   No other new complaints  Denies hyper or hypo thyroid symptoms such as voice, skin or hair change   Wt Readings from Last 3 Encounters:  06/25/21 (!) 332 lb 2 oz (150.7 kg)  06/01/21 (!) 332 lb 9.6 oz (150.9 kg)  03/30/21 (!) 326 lb (147.9 kg)   BP Readings from Last 3 Encounters:  06/25/21 122/76  06/01/21 129/85  03/30/21 (!) 146/76   Immunization History  Administered Date(s) Administered   Influenza,inj,Quad PF,6+ Mos 01/02/2014, 03/26/2015, 12/25/2017, 01/21/2020, 01/25/2021   Influenza-Unspecified 02/23/2016, 02/09/2017, 02/12/2020   MMR 02/09/2017, 07/03/2017   PFIZER(Purple Top)SARS-COV-2 Vaccination 08/01/2019, 08/22/2019, 02/22/2020   Tdap 11/07/2017   Tetanus 12/21/2015    Zoster Recombinat (Shingrix) 06/22/2021   Health Maintenance Due  Topic Date Due   COLONOSCOPY (Pts 45-8yr Insurance coverage will need to be confirmed)  Never done   MAMMOGRAM  11/16/2016      Past Medical History:  Diagnosis Date   Anxiety    Arthritis    Depression    Dyspnea    Elevated blood pressure, situational 05/05/2016   Endometriosis    Exercise-induced asthma 09/08/2015   GERD (gastroesophageal reflux disease)    History of kidney stones    Hypothyroidism    Migraine    [place]   PONV (postoperative nausea and vomiting)    Sleep apnea    Thyroid disease    Umbilical hernia    woke up while porta cath removed    Past Surgical History:  Procedure Laterality Date   [facility]  [facility]   [facility], supracervical hyst, BSO, infracolic omentectomy, lymphadenectomy, aortic lymph node sampling   BRONCHIAL NEEDLE ASPIRATION BIOPSY  12/16/2020   Procedure: BRONCHIAL NEEDLE ASPIRATION BIOPSIES;  Surgeon: ARigoberto Noel MD;  [facility]  Service: Cardiopulmonary;;   BRONCHIAL WASHINGS  12/16/2020   Procedure: BRONCHIAL WASHINGS;  Surgeon: ARigoberto Noel MD;  [facility]  Service: Cardiopulmonary;;   CHOLECYSTECTOMY N/A 07/24/2018   Procedure:  LAPAROSCOPIC CHOLECYSTECTOMY WITH INTRAOPERATIVE CHOLANGIOGRAM;  Surgeon: Armandina Gemma, MD;  [facility];  Service: General;  Laterality: N/A;   ERCP N/A 07/26/2018   Procedure: ENDOSCOPIC RETROGRADE CHOLANGIOPANCREATOGRAPHY (ERCP);  Surgeon: Ladene Artist, MD;  Location[facility];  Service: Endoscopy;  Laterality: N/A;   GANGLION CYST EXCISION Right    hand   INCISIONAL HERNIA REPAIR N/A 12/25/2020   Procedure: LAPAROSCOPIC INCISIONAL HERNIA REPAIR WITH MESH;  Surgeon: Kinsinger, Arta Bruce, MD;  [facility];  Service: General;  Laterality: N/A;   IR GENERIC HISTORICAL  05/12/2016   IR REMOVAL TUN ACCESS W/ PORT W/O FL MOD SED  05/12/2016 [facility]   LIPOMA EXCISION Left    ankle   MENISCUS REPAIR Right    Porta a cath removal     PORTA CATH INSERTION     REMOVAL OF STONES  07/26/2018   Procedure: REMOVAL OF STONES;  Surgeon: Ladene Artist, MD;  Location: [facility];  Service: Endoscopy;;   SPHINCTEROTOMY  07/26/2018   Procedure: Joan Mayans;  Surgeon: Ladene Artist, MD;  Location: [facility];  Service: Endoscopy;;   VIDEO BRONCHOSCOPY WITH ENDOBRONCHIAL ULTRASOUND N/A 12/16/2020   Procedure: VIDEO BRONCHOSCOPY WITH ENDOBRONCHIAL ULTRASOUND;  Surgeon: Rigoberto Noel, MD;  Location: [facility];  Service: Cardiopulmonary;  Laterality: N/A;    reports that she has never smoked. She has never used smokeless tobacco. She reports that she does not currently use alcohol. She reports that she does not use drugs. family history includes Atrial fibrillation in her father; Dementia in her paternal grandfather and paternal grandmother; Diabetes in her maternal aunt and mother; Healthy in her brother; Heart attack in her maternal grandfather; Heart disease in her mother; Heart failure in her maternal grandmother; Hypertension in her mother; Osteoarthritis in her mother; Stroke in her father and mother. Allergies  Allergen Reactions   Moxifloxacin Anaphylaxis    needs epinephrine shot   Quinolones Anaphylaxis, Shortness Of Breath and Swelling   Doxycycline Nausea And Vomiting   Escitalopram Oxalate Other (See Comments)     fatigue   Lisinopril Cough   Sulfonamide Derivatives Swelling and Rash    needs epinephrine shot--rash and lip swelling   Current Outpatient Medications on File Prior to Visit  Medication Sig Dispense Refill   acetaminophen (TYLENOL) 650 MG CR tablet Take 1,300 mg by mouth daily as needed for pain.     ALPRAZolam (XANAX) 1 MG tablet Take 1 tablet (1 mg total) by mouth 2 (two) times daily as needed for anxiety. 60 tablet 2   amoxicillin-clavulanate (AUGMENTIN) 875-125 MG tablet Take 1  tablet by mouth 2 (two) times daily.     benzonatate (TESSALON) 100 MG capsule 1 - 2 TABS BY MOUTH THREE TIMES DAILY AS NEEDED FOR COUGH 60 capsule 2   Blood Pressure Monitoring (OMRON 3 SERIES BP MONITOR) DEVI Use as directed. 1 each 0   cephALEXin (KEFLEX) 500 MG capsule Take 1 capsule (500 mg total) by mouth 3 (three) times daily. 21 capsule 0   clobetasol cream (TEMOVATE) 1.51 % Apply 1 application topically 2 (two) times daily as needed. 45 g 0   cyclobenzaprine (FLEXERIL) 5 MG tablet Take 1 tablet (5 mg total) by mouth at bedtime as needed for muscle spasms. 90 tablet 3   estradiol (VIVELLE-DOT) 0.1 MG/24HR patch Place 1 patch (0.1 mg total) onto the skin 2 (two) times a week. 8 patch 12   famotidine (PEPCID) 20 MG tablet Take 1 tablet (20 mg total) by  mouth 2 (two) times daily 60 tablet 11   fluticasone (FLONASE) 50 MCG/ACT nasal spray Place 2 sprays into both nostrils daily. In each nostril 16 g 1   folic acid (FOLVITE) 1 MG tablet Take 2 tablets (2 mg total) by mouth daily. 180 tablet 3   gabapentin (NEURONTIN) 600 MG tablet Take 1 tablet (600 mg total) by mouth 3 (three) times daily. 270 tablet 3   hydroxypropyl methylcellulose / hypromellose (ISOPTO TEARS / GONIOVISC) 2.5 % ophthalmic solution Place 1 drop into both eyes daily as needed for dry eyes.     ibuprofen (ADVIL) 800 MG tablet Take 1 tablet (800 mg total) by mouth every 8 (eight) hours as needed. 50 tablet 0   levothyroxine (SYNTHROID) 75 MCG tablet TAKE 1 TABLET BY MOUTH EVERY DAY (Patient taking differently: Take 75 mcg by mouth daily before breakfast.) 30 tablet 0   losartan (COZAAR) 25 MG tablet Take 1 tablet (25 mg total) by mouth daily. 30 tablet 0   methotrexate 50 MG/2ML injection Inject 0.8 mLs (20 mg total) into the skin once a week. 10 mL 0   omeprazole (PRILOSEC) 20 MG capsule Take 20 mg by mouth daily.     ondansetron (ZOFRAN) 4 MG tablet Take 1 tablet (4 mg total) by mouth every 6 (six) hours as needed for nausea or  vomiting. 30 tablet 1   sertraline (ZOLOFT) 100 MG tablet TAKE 2 TABLETS (200 MG TOTAL) BY MOUTH DAILY. (Patient taking differently: Take 100 mg by mouth in the morning and at bedtime.) 180 tablet 3   sodium chloride (OCEAN) 0.65 % SOLN nasal spray Place 1 spray into both nostrils daily as needed for congestion.     traZODone (DESYREL) 50 MG tablet Take 0.5-1 tablets (25-50 mg total) by mouth at bedtime as needed for sleep. 90 tablet 0   TUBERCULIN SYR 1CC/27GX1/2" (B-D TB SYRINGE 1CC/27GX1/2") 27G X 1/2" 1 ML MISC Use once a week. 12 each 3   Zoster Vaccine Adjuvanted [facility] Novamed) injection Inject into the muscle. 0.5 mL 1   methotrexate (RHEUMATREX) 2.5 MG tablet Take 4 tablets (10 mg total) by mouth once a week. Caution: Chemotherapy. Protect from light. (Patient not taking: Reported on 03/30/2021) 32 tablet 0   No current facility-administered medications on file prior to visit.        ROS:  All others reviewed and negative.  Objective        PE:  BP 122/76    Pulse 86    Temp 98.9 F (37.2 C) (Oral)    Ht _0  (1.702 m)    Wt (!) 332 lb 2 oz (150.7 kg)    LMP 11/02/2014    SpO2 97%    BMI 52.02 kg/m                 Constitutional: Pt appears in NAD               HENT: Head: NCAT.                Right Ear: External ear normal.                 Left Ear: External ear normal.                Eyes: . Pupils are equal, round, and reactive to light. Conjunctivae and EOM are normal               Nose: without d/c or deformity  Neck: Neck supple. Gross normal ROM               Cardiovascular: Normal rate and regular rhythm.                 Pulmonary/Chest: Effort normal and breath sounds without rales or wheezing.                Abd:  Soft, NT, ND, + BS, no organomegaly               Neurological: Pt is alert. At baseline orientation, motor grossly intact               Skin: Skin is warm. No rashes, no other new lesions, LE edema - trace left > right bilateral;   right knee with  marked crepitus but FROM and no effusion today.                 Psychiatric: Pt behavior is normal without agitation   Micro: none  Cardiac tracings I have personally interpreted today:  none  Pertinent Radiological findings (summarize): none   Lab Results  Component Value Date   WBC 11.6 (H) 06/01/2021   HGB 14.5 06/01/2021   HCT 42.8 06/01/2021   PLT 344 06/01/2021   GLUCOSE 89 06/01/2021   CHOL 159 06/25/2021   TRIG 140.0 06/25/2021   HDL 38.90 (L) 06/25/2021   LDLCALC 92 06/25/2021   ALT 21 06/01/2021   AST 18 06/01/2021   NA 138 06/01/2021   K 3.9 06/01/2021   CL 101 06/01/2021   CREATININE 0.79 06/01/2021   BUN 10 06/01/2021   CO2 34 (H) 06/01/2021   TSH 7.30 (H) 06/25/2021   INR 0.90 05/12/2016   HGBA1C 5.8 06/25/2021   Assessment/Plan:  Aedyn Mckeon is a 51 y.o. White or Caucasian [1] female with  has a past medical history of Anxiety, Arthritis, Depression, Dyspnea, Elevated blood pressure, situational (05/05/2016), Endometriosis, Exercise-induced asthma (09/08/2015), GERD (gastroesophageal reflux disease), History of kidney stones, Hypothyroidism, Migraine, Ovarian cancer, bilateral ([place]) (11/28/2014), PONV (postoperative nausea and vomiting), Sleep apnea, Thyroid disease, Umbilical hernia, and woke up while porta cath removed.  Encounter for well adult exam with abnormal findings Age and sex appropriate education and counseling updated with regular exercise and diet Referrals for preventative services - for colonoscoyp and mammogram Immunizations addressed - declines covid booster Smoking counseling  - none needed Evidence for depression or other mood disorder - chronic anxiety no change - declines need for change in tx Most recent labs reviewed. I have personally reviewed and have noted: 1) the patient's medical and social history 2) The patient's current medications and supplements 3) The patient's height, weight, and BMI have been recorded in the  chart   Asthma Stable overall , needs advair refill  HTN (hypertension) BP Readings from Last 3 Encounters:  06/25/21 122/76  06/01/21 129/85  03/30/21 (!) 146/76   Stable, pt to continue medical treatment losartan 25   Anxiety Overall stable with xanax and zoloft asd, declines need for change in tx  Hyperglycemia Lab Results  Component Value Date   HGBA1C 5.8 06/25/2021   Stable, pt to continue current medical treatment  - diet and wt control   Hypothyroidism  Stable, pt to continue levothyroxine 75 and check TFts today   Right knee pain Chronic mod to severe stable for now, followed per ortho, declines need for change in tx today, trying to put off surgury as long as possible  Venous  insufficiency Mild, d/w pt, declines further change in tx except for daytime compression stockings  Morbid obesity ([place]) Now super morbid, to continue diet, exercise and also refer medical wt management  Followup: No follow-ups on file.  [name], MD 06/26/2021 5:03 AM [facility]

## 2021-06-26 NOTE — Assessment & Plan Note (Signed)
Lab Results  Component Value Date   HGBA1C 5.8 06/25/2021   Stable, pt to continue current medical treatment  - diet and wt control

## 2021-06-26 NOTE — Assessment & Plan Note (Signed)
°  Stable, pt to continue levothyroxine 75 and check TFts today

## 2021-06-26 NOTE — Assessment & Plan Note (Signed)
Stable overall , needs advair refill

## 2021-06-26 NOTE — Assessment & Plan Note (Signed)
Overall stable with xanax and zoloft asd, declines need for change in tx

## 2021-06-26 NOTE — Assessment & Plan Note (Addendum)
Age and sex appropriate education and counseling updated with regular exercise and diet Referrals for preventative services - for colonoscoyp and mammogram Immunizations addressed - declines covid booster Smoking counseling  - none needed Evidence for depression or other mood disorder - chronic anxiety no change - declines need for change in tx Most recent labs reviewed. I have personally reviewed and have noted: 1) the patient's medical and social history 2) The patient's current medications and supplements 3) The patient's height, weight, and BMI have been recorded in the chart

## 2021-06-26 NOTE — Assessment & Plan Note (Signed)
Mild, d/w pt, declines further change in tx except for daytime compression stockings

## 2021-06-26 NOTE — Progress Notes (Signed)
Subjective: 51 year old female presents the office for foot evaluation after undergoing partial nail avulsion.  She states that the toe has been healing well.  She is no longer soaking Epsom salts and no drainage or pus.  She has not seen any increase in swelling or redness.  She still has many issues and she has noticed some pain to the lateral aspect of the left foot on the top of the foot near the lateral aspect.  No recent injury or falls.  Objective: AAO x3, NAD DP/PT pulses palpable bilaterally, CRT less than 3 seconds Status post partial nail avulsion along the left hallux toenail.  The procedure site appears to be healed.  Minimal edema.  There is no drainage or pus or ascending cellulitis. Bunion present on the left side.  There is mild discomfort noted along the lateral aspect of the foot along the sinus tarsi.  There is no area pinpoint tenderness.  Ankle, subtalar joint range of motion intact.  There is decreased medial arch upon weightbearing. No pain with calf compression, swelling, warmth, erythema  Assessment: Status post partial nail avulsion, healing well; bunion, capsulitis  Plan: -All treatment options discussed with the patient including all alternatives, risks, complications.  -From a procedure standpoint she is doing well.  Continue washing with soap and water and apply a small amount of antibiotic ointment and a bandage during the day.  Monitor for any signs or symptoms of infection or reoccurrence. -We did have a discussion regards to her foot structure we discussed shoe modifications and arch supports.  Discussed surgical intervention for the bunion as well as conservative care.  With a long discussion regards to this and ultimately will likely proceed with bunionectomy around the beginning of April.  We will likely proceed with Lapidus given the severe bunion as well as flatfoot.  We discussed the surgery was postoperative course.  I will see her closer to the date for  x-rays as well as surgery consent. -Patient encouraged to call the office with any questions, concerns, change in symptoms.   X-RAY NEXT APPOINTMENT   Trula Slade DPM

## 2021-06-26 NOTE — Assessment & Plan Note (Signed)
Now super morbid, to continue diet, exercise and also refer medical wt management

## 2021-06-26 NOTE — Assessment & Plan Note (Signed)
Chronic mod to severe stable for now, followed per ortho, declines need for change in tx today, trying to put off surgury as long as possible

## 2021-06-26 NOTE — Assessment & Plan Note (Signed)
BP Readings from Last 3 Encounters:  06/25/21 122/76  06/01/21 129/85  03/30/21 (!) 146/76   Stable, pt to continue medical treatment losartan 25

## 2021-06-28 ENCOUNTER — Other Ambulatory Visit (HOSPITAL_BASED_OUTPATIENT_CLINIC_OR_DEPARTMENT_OTHER): Payer: Self-pay

## 2021-06-28 ENCOUNTER — Encounter: Payer: Self-pay | Admitting: Internal Medicine

## 2021-06-28 DIAGNOSIS — E039 Hypothyroidism, unspecified: Secondary | ICD-10-CM

## 2021-06-28 MED ORDER — LEVOTHYROXINE SODIUM 88 MCG PO TABS
88.0000 ug | ORAL_TABLET | Freq: Every day | ORAL | 3 refills | Status: DC
  Filled 2021-06-28: qty 90, 90d supply, fill #0
  Filled 2021-10-29: qty 90, 90d supply, fill #1

## 2021-06-29 ENCOUNTER — Ambulatory Visit (INDEPENDENT_AMBULATORY_CARE_PROVIDER_SITE_OTHER): Payer: 59

## 2021-06-29 ENCOUNTER — Telehealth: Payer: Self-pay

## 2021-06-29 ENCOUNTER — Other Ambulatory Visit: Payer: Self-pay

## 2021-06-29 DIAGNOSIS — M778 Other enthesopathies, not elsewhere classified: Secondary | ICD-10-CM

## 2021-06-29 NOTE — Progress Notes (Signed)
SITUATION Reason for Consult: Evaluation for Bilateral Custom Foot Orthoses Patient / Caregiver Report: Patient is ready for foot orthotics  OBJECTIVE DATA: Patient History / Diagnosis:    ICD-10-CM   1. Capsulitis of foot, left  M77.8       Current or Previous Devices: None and no history  Foot Examination: Skin presentation:   Intact Ulcers & Callousing:   None and no history Toe / Foot Deformities:  None Weight Bearing Presentation:  Planus Sensation:    Intact  ORTHOTIC RECOMMENDATION Recommended Device: 1x pair of custom functional foot orthotics  GOALS OF ORTHOSES - Reduce Pain - Prevent Foot Deformity - Prevent Progression of Further Foot Deformity - Relieve Pressure - Improve the Overall Biomechanical Function of the Foot and Lower Extremity.  ACTIONS PERFORMED Patient was casted for Foot Orthoses via crush box. Procedure was explained and patient tolerated procedure well. All questions were answered and concerns addressed.  PLAN Potential out of pocket cost was communicated to patient. Casts are to be sent to [facility] for fabrication. Patient is to be called for fitting when devices are ready.

## 2021-06-29 NOTE — Telephone Encounter (Signed)
Casts shipped to central fabrication

## 2021-07-07 ENCOUNTER — Other Ambulatory Visit: Payer: Self-pay | Admitting: Physician Assistant

## 2021-07-07 ENCOUNTER — Other Ambulatory Visit (HOSPITAL_BASED_OUTPATIENT_CLINIC_OR_DEPARTMENT_OTHER): Payer: Self-pay

## 2021-07-07 MED ORDER — METHOTREXATE SODIUM CHEMO INJECTION 50 MG/2ML
20.0000 mg | INTRAMUSCULAR | 0 refills | Status: DC
  Filled 2021-07-07: qty 10, 70d supply, fill #0

## 2021-07-07 NOTE — Telephone Encounter (Signed)
Next Visit: 11/05/2021 ? ?Last Visit: 06/01/2021 ? ?Last Fill: 03/15/2021 ? ?DX: Sarcoidosis ? ?Current Dose per office note 06/01/2021: Methotrexate 0.42mL sq injections once weekly  ? ?Labs: 06/01/2021 06/01/2021 ? ?Okay to refill MTX?   ?

## 2021-07-12 ENCOUNTER — Other Ambulatory Visit (HOSPITAL_BASED_OUTPATIENT_CLINIC_OR_DEPARTMENT_OTHER): Payer: Self-pay

## 2021-07-12 ENCOUNTER — Other Ambulatory Visit: Payer: Self-pay | Admitting: Internal Medicine

## 2021-07-12 NOTE — Telephone Encounter (Signed)
Please refill as per office routine med refill policy (all routine meds to be refilled for 3 mo or monthly (per pt preference) up to one year from last visit, then month to month grace period for 3 mo, then further med refills will have to be denied) ? ?

## 2021-07-13 ENCOUNTER — Other Ambulatory Visit (HOSPITAL_BASED_OUTPATIENT_CLINIC_OR_DEPARTMENT_OTHER): Payer: Self-pay

## 2021-07-13 MED ORDER — LOSARTAN POTASSIUM 25 MG PO TABS
25.0000 mg | ORAL_TABLET | Freq: Every day | ORAL | 1 refills | Status: DC
  Filled 2021-07-13: qty 90, 90d supply, fill #0
  Filled 2021-10-17: qty 90, 90d supply, fill #1

## 2021-07-29 ENCOUNTER — Encounter: Payer: Self-pay | Admitting: Neurology

## 2021-07-29 DIAGNOSIS — G4733 Obstructive sleep apnea (adult) (pediatric): Secondary | ICD-10-CM | POA: Diagnosis not present

## 2021-08-02 ENCOUNTER — Other Ambulatory Visit (HOSPITAL_BASED_OUTPATIENT_CLINIC_OR_DEPARTMENT_OTHER): Payer: Self-pay

## 2021-08-03 ENCOUNTER — Ambulatory Visit: Payer: 59

## 2021-08-03 ENCOUNTER — Ambulatory Visit (INDEPENDENT_AMBULATORY_CARE_PROVIDER_SITE_OTHER): Payer: 59 | Admitting: Podiatry

## 2021-08-03 ENCOUNTER — Other Ambulatory Visit: Payer: 59

## 2021-08-03 ENCOUNTER — Ambulatory Visit (INDEPENDENT_AMBULATORY_CARE_PROVIDER_SITE_OTHER): Payer: 59

## 2021-08-03 ENCOUNTER — Other Ambulatory Visit: Payer: Self-pay

## 2021-08-03 DIAGNOSIS — M21612 Bunion of left foot: Secondary | ICD-10-CM

## 2021-08-03 DIAGNOSIS — M778 Other enthesopathies, not elsewhere classified: Secondary | ICD-10-CM | POA: Diagnosis not present

## 2021-08-03 NOTE — Patient Instructions (Signed)

## 2021-08-03 NOTE — Progress Notes (Signed)
SITUATION: ?Reason for Visit: Fitting and Delivery of Custom Fabricated Foot Orthoses ?Patient Report: Patient reports comfort and is satisfied with device. ? ?OBJECTIVE DATA: ?Patient History / Diagnosis:   ?  ICD-10-CM   ?1. Capsulitis of foot, left  M77.8   ?  ?2. Bunion, left  M21.612   ?  ? ? ?Provided Device:  Custom Functional Foot Orthotics ?    [facility]: [id number] ? ?GOAL OF ORTHOSIS ?- Improve gait ?- Decrease energy expenditure ?- Improve Balance ?- Provide Triplanar stability of foot complex ?- Facilitate motion ? ?ACTIONS PERFORMED ?Patient was fit with foot orthotics trimmed to shoe last. Patient tolerated fittign procedure.  ? ?Patient was provided with verbal and written instruction and demonstration regarding donning, doffing, wear, care, proper fit, function, purpose, cleaning, and use of the orthosis and in all related precautions and risks and benefits regarding the orthosis. ? ?Patient was also provided with verbal instruction regarding how to report any failures or malfunctions of the orthosis and necessary follow up care. Patient was also instructed to contact our office regarding any change in status that may affect the function of the orthosis. ? ?Patient demonstrated independence with proper donning, doffing, and fit and verbalized understanding of all instructions. ? ?PLAN: ?Patient is to follow up in one week or as necessary (PRN). All questions were answered and concerns addressed. Plan of care was discussed with and agreed upon by the patient. ? ?

## 2021-08-04 ENCOUNTER — Encounter: Payer: Self-pay | Admitting: Neurology

## 2021-08-06 ENCOUNTER — Encounter: Payer: Self-pay | Admitting: Physician Assistant

## 2021-08-06 ENCOUNTER — Encounter: Payer: Self-pay | Admitting: Podiatry

## 2021-08-06 DIAGNOSIS — M21612 Bunion of left foot: Secondary | ICD-10-CM | POA: Insufficient documentation

## 2021-08-06 NOTE — Progress Notes (Signed)
Subjective: ?51 year old female presents the office for surgical consultation given painful bunion on the left foot.  She does want to proceed with surgery.  She has tried conservative treatments including shoe modifications, offloading without significant improvement.  The bunion is tender with pressure and with certain shoes.  The bunion has been getting worse over time. ? ?Objective: ?AAO x3, NAD ?DP/PT pulses palpable bilaterally, CRT less than 3 seconds ?Significant bunions present on the left foot.  Tenderness or pain on the medial aspect of the bunion.  There is no pain or crepitation with degenerative motion.  There is mild hypermobility present the first ray.  Decreased medial arch upon weightbearing.  No other areas of tenderness noted today.  MMT 5/5. ?No pain with calf compression, swelling, warmth, erythema ? ?Assessment: ?Left foot symptomatic bunion ? ?Plan: ?-All treatment options discussed with the patient including all alternatives, risks, complications.  ?-X-rays were obtained reviewed.  3 views of the left foot were obtained.  There is increased intermetatarsal angle of approximately 17 degrees. ?-We discussed both conservative as well as surgical measures.  She is to do conservative care without significant improvement.  She was to proceed with surgical intervention.  Discussed with her left foot Lapidus bunionectomy with possible Akin osteotomy.  After discussion she wants to proceed with this. ?-The incision placement as well as the postoperative course was discussed with the patient. I discussed risks of the surgery which include, but not limited to, infection, bleeding, pain, swelling, need for further surgery, delayed or nonhealing, painful or ugly scar, numbness or sensation changes, over/under correction, recurrence, transfer lesions, further deformity, hardware failure, DVT/PE, loss of toe/foot. Patient understands these risks and wishes to proceed with surgery. The surgical consent was  reviewed with the patient all 3 pages were signed. No promises or guarantees were given to the outcome of the procedure. All questions were answered to the best of my ability. Before the surgery the patient was encouraged to call the office if there is any further questions. The surgery will be performed at the [facility] on an outpatient basis. ?-Knee scooter for post-op ? ?Trula Slade DPM ? ? ?

## 2021-08-10 ENCOUNTER — Other Ambulatory Visit (HOSPITAL_BASED_OUTPATIENT_CLINIC_OR_DEPARTMENT_OTHER): Payer: Self-pay

## 2021-08-10 ENCOUNTER — Telehealth: Payer: Self-pay | Admitting: Urology

## 2021-08-10 ENCOUNTER — Other Ambulatory Visit: Payer: Self-pay | Admitting: Internal Medicine

## 2021-08-10 NOTE — Telephone Encounter (Signed)
DOS - 09/08/21 ? ?LAPIDUS PROCEDURE INCLUDING BUNION LEFT --- 80881 ?AIKEN OSTEOTOMY LEFT  --- 28310 ? ? ?[facility] EFFECTIVE DATE - 05/09/21 ? ?PLAN DEDUCTIBLE - $350.00 W/ $0.00 REMAINING ?OUT OF POCKET - $7900.00 W/ $7,176.76 REMAINING ?COINSURANCE - 40% ?COPAY - $0.00 ? ? ?SPOKE WITH MARA E. WITH [facility] SHE STATED THAT FOR CPT CODES 10315 AND 94585 NO PRIOR AUTH IS REQUIRED. ? ?REF # [id number] ?

## 2021-08-11 ENCOUNTER — Other Ambulatory Visit (HOSPITAL_BASED_OUTPATIENT_CLINIC_OR_DEPARTMENT_OTHER): Payer: Self-pay

## 2021-08-11 MED ORDER — SERTRALINE HCL 100 MG PO TABS
ORAL_TABLET | Freq: Every day | ORAL | 3 refills | Status: DC
  Filled 2021-08-11: qty 180, 90d supply, fill #0
  Filled 2021-11-18: qty 180, 90d supply, fill #1
  Filled 2022-02-10: qty 180, 90d supply, fill #2
  Filled 2022-05-18: qty 180, 90d supply, fill #3

## 2021-08-12 ENCOUNTER — Other Ambulatory Visit (HOSPITAL_BASED_OUTPATIENT_CLINIC_OR_DEPARTMENT_OTHER): Payer: Self-pay

## 2021-08-16 ENCOUNTER — Other Ambulatory Visit (HOSPITAL_BASED_OUTPATIENT_CLINIC_OR_DEPARTMENT_OTHER): Payer: Self-pay

## 2021-08-17 ENCOUNTER — Other Ambulatory Visit (HOSPITAL_BASED_OUTPATIENT_CLINIC_OR_DEPARTMENT_OTHER): Payer: Self-pay

## 2021-08-26 ENCOUNTER — Encounter: Payer: Self-pay | Admitting: Physician Assistant

## 2021-08-26 ENCOUNTER — Other Ambulatory Visit (HOSPITAL_BASED_OUTPATIENT_CLINIC_OR_DEPARTMENT_OTHER): Payer: Self-pay

## 2021-08-26 ENCOUNTER — Ambulatory Visit: Payer: 59 | Admitting: Physician Assistant

## 2021-08-26 VITALS — BP 110/70 | HR 84 | Ht 66.5 in | Wt 330.0 lb

## 2021-08-26 DIAGNOSIS — R110 Nausea: Secondary | ICD-10-CM

## 2021-08-26 DIAGNOSIS — R1084 Generalized abdominal pain: Secondary | ICD-10-CM | POA: Diagnosis not present

## 2021-08-26 DIAGNOSIS — K219 Gastro-esophageal reflux disease without esophagitis: Secondary | ICD-10-CM | POA: Diagnosis not present

## 2021-08-26 DIAGNOSIS — R194 Change in bowel habit: Secondary | ICD-10-CM

## 2021-08-26 MED ORDER — NA SULFATE-K SULFATE-MG SULF 17.5-3.13-1.6 GM/177ML PO SOLN
1.0000 | Freq: Once | ORAL | 0 refills | Status: AC
  Filled 2021-08-26: qty 354, 1d supply, fill #0

## 2021-08-26 MED ORDER — OMEPRAZOLE 40 MG PO CPDR
40.0000 mg | DELAYED_RELEASE_CAPSULE | Freq: Every day | ORAL | 3 refills | Status: DC
  Filled 2021-08-26: qty 30, 30d supply, fill #0
  Filled 2021-10-07: qty 30, 30d supply, fill #1
  Filled 2021-11-08: qty 30, 30d supply, fill #2
  Filled 2021-12-03: qty 30, 30d supply, fill #3

## 2021-08-26 NOTE — Patient Instructions (Addendum)
If you are age 51 or older, your body mass index should be between 23-30. Your Body mass index is 52.47 kg/m?Marland Kitchen If this is out of the aforementioned range listed, please consider follow up with your Primary Care Provider. ? ?If you are age 75 or younger, your body mass index should be between 19-25. Your Body mass index is 52.47 kg/m?Marland Kitchen If this is out of the aformentioned range listed, please consider follow up with your Primary Care Provider.  ? ?________________________________________________________ ? ?The [facility] providers would like to encourage you to use [facility] to communicate with providers for non-urgent requests or questions.  Due to long hold times on the telephone, sending your provider a message by [facility] may be a faster and more efficient way to get a response.  Please allow 48 business hours for a response.  Please remember that this is for non-urgent requests.  ?_______________________________________________________ ? ?You have been scheduled for an endoscopy and colonoscopy. Please follow the written instructions given to you at your visit today. ?Please pick up your prep supplies at the pharmacy within the next 1-3 days. ?If you use inhalers (even only as needed), please bring them with you on the day of your procedure.  ? ?Start the omperazole 40 mg once a day. ?Hold famodidine ?Start a fiber supplement daily and Miralax as needed. ? ?It was a pleasure to see you today! ? ?Thank you for trusting me with your gastrointestinal care!   ? ? ?

## 2021-08-26 NOTE — Progress Notes (Signed)
? ?Chief Complaint: GERD, nausea, burping, change in bowel habits ? ?HPI: ?   [name] is a 50 year old female, assigned to Dr. Lyndel Safe during hospitalization in 2020, with a past medical history as listed below including endometriosis, ovarian cancer and reflux, who was referred to me by Biagio Borg, MD for a complaint of GERD, nausea, burping and change in bowel habits. ?   07/25/2018 patient consulted by her service for positive IOC and underwent ERCP with Dr. Fuller Plan with sphincterotomy. ?   Today, the patient tells me that over the past 10 years she has had trouble with heartburn and reflux.  Occasionally she will have flares after eating something particularly offensive where she will get nauseous and a lot of burping.  Also describes radiating from diarrhea urgently after eating to sometimes constipation for 3 days at a time.  Tells me that dairy seems to make her go to the bathroom and have a lot of gas.  This has been going on for some time.  Currently only taking Pepcid 20 mg twice a day.  Does not recall being on any other medications for reflux. ?   Denies fever, chills, weight loss, blood in her stool or symptoms that awaken her from sleep. ? ?Past Medical History:  ?Diagnosis Date  ? Anxiety   ? Arthritis   ? Depression   ? Dyspnea   ? Elevated blood pressure, situational 05/05/2016  ? Endometriosis   ? Exercise-induced asthma 09/08/2015  ? GERD (gastroesophageal reflux disease)   ? History of kidney stones   ? Hypothyroidism   ? Migraine   ? Ovarian cancer, bilateral ([place]) 11/28/2014  ? PONV (postoperative nausea and vomiting)   ? Sleep apnea   ? Thyroid disease   ? Umbilical hernia   ? woke up while porta cath removed   ? ? ?Past Surgical History:  ?Procedure Laterality Date  ? ABDOMINAL HYSTERECTOMY  11/10/2014  ? at [facility], Exp lap, supracervical hyst, BSO, infracolic omentectomy, lymphadenectomy, aortic lymph node sampling  ? BRONCHIAL NEEDLE ASPIRATION BIOPSY  12/16/2020  ? Procedure: BRONCHIAL  NEEDLE ASPIRATION BIOPSIES;  Surgeon: Rigoberto Noel, MD;  Location: [place];  Service: Cardiopulmonary;;  ? BRONCHIAL WASHINGS  12/16/2020  ? Procedure: BRONCHIAL WASHINGS;  Surgeon: Rigoberto Noel, MD;  Location: [place];  Service: Cardiopulmonary;;  ? CHOLECYSTECTOMY N/A 07/24/2018  ? Procedure: LAPAROSCOPIC CHOLECYSTECTOMY WITH INTRAOPERATIVE CHOLANGIOGRAM;  Surgeon: Armandina Gemma, MD;  Location: [facility];  Service: General;  Laterality: N/A;  ? ERCP N/A 07/26/2018  ? Procedure: ENDOSCOPIC RETROGRADE CHOLANGIOPANCREATOGRAPHY (ERCP);  Surgeon: Ladene Artist, MD;  Location: [facility];  Service: Endoscopy;  Laterality: N/A;  ? GANGLION CYST EXCISION Right   ? hand  ? INCISIONAL HERNIA REPAIR N/A 12/25/2020  ? Procedure: LAPAROSCOPIC INCISIONAL HERNIA REPAIR WITH MESH;  Surgeon: Kinsinger, Arta Bruce, MD;  Location: [facility];  Service: General;  Laterality: N/A;  ? IR GENERIC HISTORICAL  05/12/2016  ? IR REMOVAL TUN ACCESS W/ PORT W/O FL MOD SED 05/12/2016 [facility]  ? LIPOMA EXCISION Left   ? ankle  ? MENISCUS REPAIR Right   ? Porta a cath removal    ? PORTA CATH INSERTION    ? REMOVAL OF STONES  07/26/2018  ? Procedure: REMOVAL OF STONES;  Surgeon: Ladene Artist, MD;  Location: [facility];  Service: Endoscopy;;  ? SPHINCTEROTOMY  07/26/2018  ? Procedure: SPHINCTEROTOMY;  Surgeon: Ladene Artist, MD;  Location: [facility];  Service: Endoscopy;;  ? VIDEO  BRONCHOSCOPY WITH ENDOBRONCHIAL ULTRASOUND N/A 12/16/2020  ? Procedure: VIDEO BRONCHOSCOPY WITH ENDOBRONCHIAL ULTRASOUND;  Surgeon: Rigoberto Noel, MD;  Location: [place];  Service: Cardiopulmonary;  Laterality: N/A;  ? ? ?Current Outpatient Medications  ?Medication Sig Dispense Refill  ? acetaminophen (TYLENOL) 650 MG CR tablet Take 1,300 mg by mouth daily as needed for pain.    ? ALPRAZolam (XANAX) 1 MG tablet Take 1 tablet (1 mg total) by mouth 2 (two) times daily as needed for anxiety. 60 tablet 2  ? benzonatate (TESSALON) 100 MG capsule  1 - 2 TABS BY MOUTH THREE TIMES DAILY AS NEEDED FOR COUGH 60 capsule 2  ? Blood Pressure Monitoring (OMRON 3 SERIES BP MONITOR) DEVI Use as directed. 1 each 0  ? cephALEXin (KEFLEX) 500 MG capsule Take 1 capsule (500 mg total) by mouth 3 (three) times daily. 21 capsule 0  ? clobetasol cream (TEMOVATE) 3.55 % Apply 1 application topically 2 (two) times daily as needed. 45 g 0  ? cyclobenzaprine (FLEXERIL) 5 MG tablet Take 1 tablet (5 mg total) by mouth at bedtime as needed for muscle spasms. 90 tablet 3  ? estradiol (VIVELLE-DOT) 0.1 MG/24HR patch Place 1 patch (0.1 mg total) onto the skin 2 (two) times a week. 8 patch 12  ? famotidine (PEPCID) 20 MG tablet Take 1 tablet (20 mg total) by mouth 2 (two) times daily 60 tablet 11  ? fluticasone (FLONASE) 50 MCG/ACT nasal spray Place 2 sprays into both nostrils daily. In each nostril 16 g 1  ? fluticasone-salmeterol (ADVAIR DISKUS) 250-50 MCG/ACT AEPB Inhale 1 puff into the lungs in the morning and at bedtime. 732 each 3  ? folic acid (FOLVITE) 1 MG tablet Take 2 tablets (2 mg total) by mouth daily. 180 tablet 3  ? gabapentin (NEURONTIN) 600 MG tablet Take 1 tablet (600 mg total) by mouth 3 (three) times daily. 270 tablet 3  ? hydroxypropyl methylcellulose / hypromellose (ISOPTO TEARS / GONIOVISC) 2.5 % ophthalmic solution Place 1 drop into both eyes daily as needed for dry eyes.    ? levothyroxine (SYNTHROID) 75 MCG tablet TAKE 1 TABLET BY MOUTH EVERY DAY (Patient taking differently: Take 75 mcg by mouth daily before breakfast.) 30 tablet 0  ? levothyroxine (SYNTHROID) 88 MCG tablet Take 1 tablet (88 mcg total) by mouth daily. 90 tablet 3  ? losartan (COZAAR) 25 MG tablet Take 1 tablet (25 mg total) by mouth daily. Please schedule an appointment. 90 tablet 1  ? methotrexate (RHEUMATREX) 2.5 MG tablet Take 4 tablets (10 mg total) by mouth once a week. Caution: Chemotherapy. Protect from light. (Patient not taking: Reported on 03/30/2021) 32 tablet 0  ? methotrexate 50  MG/2ML injection Inject 0.8 mLs (20 mg total) into the skin once a week. 10 mL 0  ? omeprazole (PRILOSEC) 20 MG capsule Take 20 mg by mouth daily.    ? ondansetron (ZOFRAN) 4 MG tablet Take 1 tablet (4 mg total) by mouth every 6 (six) hours as needed for nausea or vomiting. 30 tablet 1  ? sertraline (ZOLOFT) 100 MG tablet TAKE 2 TABLETS (200 MG TOTAL) BY MOUTH DAILY. 180 tablet 3  ? sodium chloride (OCEAN) 0.65 % SOLN nasal spray Place 1 spray into both nostrils daily as needed for congestion.    ? traZODone (DESYREL) 50 MG tablet Take 0.5-1 tablets (25-50 mg total) by mouth at bedtime as needed for sleep. 90 tablet 0  ? TUBERCULIN SYR 1CC/27GX1/2" (B-D TB SYRINGE 1CC/27GX1/2") 27G X 1/2"  1 ML MISC Use once a week. 12 each 3  ? Zoster Vaccine Adjuvanted [facility]) injection Inject into the muscle. 0.5 mL 1  ? ?No current facility-administered medications for this visit.  ? ? ?Allergies as of 08/26/2021 - Review Complete 08/03/2021  ?Allergen Reaction Noted  ? Moxifloxacin Anaphylaxis   ? Quinolones Anaphylaxis, Shortness Of Breath, and Swelling 11/21/2014  ? Doxycycline Nausea And Vomiting 12/27/2007  ? Escitalopram oxalate Other (See Comments) 02/24/2010  ? Lisinopril Cough 10/13/2020  ? Sulfonamide derivatives Swelling and Rash 04/13/2007  ? ? ?Family History  ?Problem Relation Age of Onset  ? Hypertension Mother   ? Diabetes Mother   ? Stroke Mother   ?     Deceased  ? Heart disease Mother   ? Osteoarthritis Mother   ? Atrial fibrillation Father   ?     Living  ? Stroke Father   ?     optic nerve left eye  ? Healthy Brother   ? Diabetes Maternal Aunt   ? Heart failure Maternal Grandmother   ? Heart attack Maternal Grandfather   ? Dementia Paternal Grandmother   ? Dementia Paternal Grandfather   ? ? ?Social History  ? ?Socioeconomic History  ? Marital status: Married  ?  Spouse name: Not on file  ? Number of children: 0  ? Years of education: Not on file  ? Highest education level: Not on file  ?Occupational  History  ? Not on file  ?Tobacco Use  ? Smoking status: Never  ? Smokeless tobacco: Never  ?Vaping Use  ? Vaping Use: Never used  ?Substance and Sexual Activity  ? Alcohol use: Not Currently  ? Drug use: No  ? Se

## 2021-08-29 NOTE — Progress Notes (Signed)
Agree with excellent assessment/plan ?EGD/colon at [facility] d/t BMI >50 ?RG ?

## 2021-08-31 ENCOUNTER — Other Ambulatory Visit: Payer: Self-pay | Admitting: Internal Medicine

## 2021-08-31 DIAGNOSIS — F419 Anxiety disorder, unspecified: Secondary | ICD-10-CM

## 2021-09-01 ENCOUNTER — Other Ambulatory Visit (HOSPITAL_BASED_OUTPATIENT_CLINIC_OR_DEPARTMENT_OTHER): Payer: Self-pay

## 2021-09-01 MED ORDER — ALPRAZOLAM 1 MG PO TABS
1.0000 mg | ORAL_TABLET | Freq: Two times a day (BID) | ORAL | 2 refills | Status: DC | PRN
  Filled 2021-09-01: qty 60, 30d supply, fill #0
  Filled 2021-10-18: qty 60, 30d supply, fill #1
  Filled 2021-12-17: qty 60, 30d supply, fill #2

## 2021-09-03 ENCOUNTER — Ambulatory Visit: Payer: 59 | Admitting: Neurology

## 2021-09-08 ENCOUNTER — Other Ambulatory Visit: Payer: Self-pay | Admitting: Podiatry

## 2021-09-08 ENCOUNTER — Encounter: Payer: Self-pay | Admitting: Podiatry

## 2021-09-08 ENCOUNTER — Other Ambulatory Visit (HOSPITAL_BASED_OUTPATIENT_CLINIC_OR_DEPARTMENT_OTHER): Payer: Self-pay

## 2021-09-08 DIAGNOSIS — M21612 Bunion of left foot: Secondary | ICD-10-CM | POA: Diagnosis not present

## 2021-09-08 DIAGNOSIS — M2012 Hallux valgus (acquired), left foot: Secondary | ICD-10-CM | POA: Diagnosis not present

## 2021-09-08 DIAGNOSIS — M25572 Pain in left ankle and joints of left foot: Secondary | ICD-10-CM | POA: Diagnosis not present

## 2021-09-08 MED ORDER — OXYCODONE-ACETAMINOPHEN 10-325 MG PO TABS: 1.0000 | ORAL_TABLET | ORAL | 0 refills | Status: AC | PRN

## 2021-09-08 MED ORDER — ENOXAPARIN SODIUM 40 MG/0.4ML IJ SOSY
40.0000 mg | PREFILLED_SYRINGE | INTRAMUSCULAR | 0 refills | Status: DC
  Filled 2021-09-08: qty 5.6, 14d supply, fill #0

## 2021-09-08 MED ORDER — CEPHALEXIN 500 MG PO CAPS
500.0000 mg | ORAL_CAPSULE | Freq: Three times a day (TID) | ORAL | 0 refills | Status: DC
  Filled 2021-09-08: qty 21, 7d supply, fill #0

## 2021-09-08 MED ORDER — OXYCODONE-ACETAMINOPHEN 10-325 MG PO TABS
ORAL_TABLET | ORAL | 0 refills | Status: DC
  Filled 2021-09-08: qty 30, 3d supply, fill #0

## 2021-09-08 MED ORDER — PROMETHAZINE HCL 25 MG PO TABS
25.0000 mg | ORAL_TABLET | Freq: Three times a day (TID) | ORAL | 0 refills | Status: DC | PRN
  Filled 2021-09-08: qty 20, 7d supply, fill #0

## 2021-09-08 NOTE — Progress Notes (Signed)
Sent medications. Unable to do the pain medication. Will have someone else send it over.  ? ?Percocet 10/325 1 q 4 prn pain disp #30 ?

## 2021-09-13 ENCOUNTER — Encounter: Payer: Self-pay | Admitting: Podiatry

## 2021-09-13 ENCOUNTER — Other Ambulatory Visit (HOSPITAL_BASED_OUTPATIENT_CLINIC_OR_DEPARTMENT_OTHER): Payer: Self-pay

## 2021-09-13 ENCOUNTER — Ambulatory Visit (INDEPENDENT_AMBULATORY_CARE_PROVIDER_SITE_OTHER): Payer: 59 | Admitting: Podiatry

## 2021-09-13 ENCOUNTER — Ambulatory Visit (INDEPENDENT_AMBULATORY_CARE_PROVIDER_SITE_OTHER): Payer: 59

## 2021-09-13 DIAGNOSIS — Z9889 Other specified postprocedural states: Secondary | ICD-10-CM

## 2021-09-13 DIAGNOSIS — M21612 Bunion of left foot: Secondary | ICD-10-CM

## 2021-09-14 ENCOUNTER — Encounter: Payer: Self-pay | Admitting: Podiatry

## 2021-09-14 ENCOUNTER — Other Ambulatory Visit (HOSPITAL_BASED_OUTPATIENT_CLINIC_OR_DEPARTMENT_OTHER): Payer: Self-pay

## 2021-09-14 ENCOUNTER — Other Ambulatory Visit: Payer: Self-pay | Admitting: Podiatry

## 2021-09-14 ENCOUNTER — Telehealth: Payer: Self-pay | Admitting: *Deleted

## 2021-09-14 HISTORY — PX: BUNIONECTOMY: SHX129

## 2021-09-14 MED ORDER — OXYCODONE-ACETAMINOPHEN 10-325 MG PO TABS
1.0000 | ORAL_TABLET | ORAL | 0 refills | Status: DC | PRN
  Filled 2021-09-14: qty 20, 4d supply, fill #0

## 2021-09-14 NOTE — Telephone Encounter (Signed)
Please advise 

## 2021-09-14 NOTE — Telephone Encounter (Signed)
Patient calling to request a refill of her pain medicine, please send to [facility] [place] location. ?

## 2021-09-15 NOTE — Progress Notes (Signed)
Subjective: ?[name] is a 51 y.o. is seen today in office s/p left foot Lapidus bunionectomy preformed on 09/08/2021.  She states that her pain is improving and she still taking the pain medication.  She did notice that she went to church yesterday she has been using her insulin after her foot hanging down as it is more sore.  She is been using a knee scooter.  Denies any systemic complaints such as fevers, chills, nausea, vomiting. No calf pain, chest pain, shortness of breath.  ? ?Objective: ?General: No acute distress, AAOx3  ?DP/PT pulses palpable 2/4, CRT < 3 sec to all digits.  ?Protective sensation intact. Motor function intact.  ?Left foot: Incision is well coapted without any evidence of dehiscence sutures intact. There is no surrounding erythema, ascending cellulitis, fluctuance, crepitus, malodor, drainage/purulence. There is mild to moderate edema around the surgical site. There is mild pain along the surgical site.  Toes in rectus position ?No other areas of tenderness to bilateral lower extremities.  ?No other open lesions or pre-ulcerative lesions.  ?No pain with calf compression, swelling, warmth, erythema.  ? ?Assessment and Plan:  ?Status post left foot Lapidus bunionectomy, doing well with no complications  ? ?-Treatment options discussed including all alternatives, risks, and complications ?-X-rays were obtained reviewed.  3 views were obtained.  There is no evidence of acute fracture.  Hardware intact status post Lapidus bunionectomy. ?-Antibiotic ointment and a bandage applied.  Keep the dressing clean, dry, intact for now. ?-Continue nonweightbearing ?-Ice/elevation ?-Pain medication as needed. ?-She can return to work we discussed returning back to work on a part-time basis as she states that she can keep her foot elevated.  Her husband will drive her to work. ?-Monitor for any clinical signs or symptoms of infection and DVT/PE and directed to call the office immediately should any occur  or go to the ER. ?-Follow-up as scheduled for possible suture removal or sooner if any problems arise. In the meantime, encouraged to call the office with any questions, concerns, change in symptoms.  ? ?Celesta Gentile, DPM ? ?

## 2021-09-23 ENCOUNTER — Other Ambulatory Visit (HOSPITAL_BASED_OUTPATIENT_CLINIC_OR_DEPARTMENT_OTHER): Payer: Self-pay

## 2021-09-23 ENCOUNTER — Ambulatory Visit (INDEPENDENT_AMBULATORY_CARE_PROVIDER_SITE_OTHER): Payer: 59 | Admitting: Podiatry

## 2021-09-23 ENCOUNTER — Encounter: Payer: Self-pay | Admitting: Podiatry

## 2021-09-23 DIAGNOSIS — M21612 Bunion of left foot: Secondary | ICD-10-CM

## 2021-09-23 DIAGNOSIS — Z9889 Other specified postprocedural states: Secondary | ICD-10-CM

## 2021-09-23 MED ORDER — IBUPROFEN 800 MG PO TABS
800.0000 mg | ORAL_TABLET | Freq: Three times a day (TID) | ORAL | 0 refills | Status: DC | PRN
  Filled 2021-09-23: qty 30, 10d supply, fill #0

## 2021-09-23 MED ORDER — OXYCODONE-ACETAMINOPHEN 10-325 MG PO TABS
1.0000 | ORAL_TABLET | Freq: Four times a day (QID) | ORAL | 0 refills | Status: DC | PRN
  Filled 2021-09-23: qty 20, 5d supply, fill #0

## 2021-09-23 MED ORDER — CEPHALEXIN 500 MG PO CAPS
500.0000 mg | ORAL_CAPSULE | Freq: Three times a day (TID) | ORAL | 0 refills | Status: DC
  Filled 2021-09-23: qty 21, 7d supply, fill #0

## 2021-09-23 NOTE — Telephone Encounter (Signed)
Krystina- can you help with this? Thanks!

## 2021-09-26 NOTE — Progress Notes (Signed)
Subjective: Kaliann Coryell is a 51 y.o. is seen today in office s/p left foot Lapidus bunionectomy preformed on 09/08/2021.  She has been doing well.  She has gone back to work she is gone back to work full-time, we discussed part-time last appointment.  She does get pain in swelling.  They have been changing the bandage and applying Aquaphor to the incision.  No drainage or pus to report.  No recent injuries or falls.  No fevers or chills.  No other concerns  Objective: General: No acute distress, AAOx3  DP/PT pulses palpable 2/4, CRT < 3 sec to all digits.  Protective sensation intact. Motor function intact.  Left foot: Incision is well coapted without any evidence of dehiscence sutures intact.  There are some macerated tissue along the incision likely from the Aquaphor.  Slight erythema likely more from inflammation as opposed to infection there is no drainage or pus.  No dehiscence.  Mild tenderness palpation moderate edema.  Toes in rectus position. No other open lesions or pre-ulcerative lesions.  No pain with calf compression, swelling, warmth, erythema.   Assessment and Plan:  Status post left foot Lapidus bunionectomy, doing well with no complications   -Treatment options discussed including all alternatives, risks, and complications -Refill pain medicine but now she is finished with the Lovenox and would also order ibuprofen 8 mg as needed.  As a precaution given mild erythema will start cephalexin.  Discussed changing the dressing to apply a small amount of antibiotic ointment to the incision followed by dressing.  Applied Betadine today. -Continue nonweightbearing in cam boot -Ice, elevation -Monitor for any clinical signs or symptoms of infection and directed to call the office immediately should any occur or go to the ER.  Return in about 2 weeks (around 10/07/2021) for post-op visit, x-ray.  Trula Slade DPM

## 2021-09-27 ENCOUNTER — Other Ambulatory Visit: Payer: Self-pay | Admitting: Internal Medicine

## 2021-09-27 ENCOUNTER — Other Ambulatory Visit (HOSPITAL_BASED_OUTPATIENT_CLINIC_OR_DEPARTMENT_OTHER): Payer: Self-pay

## 2021-09-27 MED ORDER — TRAZODONE HCL 50 MG PO TABS
25.0000 mg | ORAL_TABLET | Freq: Every evening | ORAL | 1 refills | Status: DC | PRN
  Filled 2021-09-27: qty 90, 90d supply, fill #0
  Filled 2022-01-04: qty 90, 90d supply, fill #1

## 2021-10-07 ENCOUNTER — Other Ambulatory Visit (HOSPITAL_BASED_OUTPATIENT_CLINIC_OR_DEPARTMENT_OTHER): Payer: Self-pay

## 2021-10-07 ENCOUNTER — Encounter: Payer: Self-pay | Admitting: Podiatry

## 2021-10-07 ENCOUNTER — Ambulatory Visit (INDEPENDENT_AMBULATORY_CARE_PROVIDER_SITE_OTHER): Payer: 59 | Admitting: Podiatry

## 2021-10-07 ENCOUNTER — Ambulatory Visit (INDEPENDENT_AMBULATORY_CARE_PROVIDER_SITE_OTHER): Payer: 59

## 2021-10-07 ENCOUNTER — Other Ambulatory Visit: Payer: Self-pay | Admitting: Podiatry

## 2021-10-07 DIAGNOSIS — Z9889 Other specified postprocedural states: Secondary | ICD-10-CM | POA: Diagnosis not present

## 2021-10-07 DIAGNOSIS — M21612 Bunion of left foot: Secondary | ICD-10-CM

## 2021-10-08 ENCOUNTER — Other Ambulatory Visit: Payer: Self-pay | Admitting: Podiatry

## 2021-10-08 ENCOUNTER — Other Ambulatory Visit (HOSPITAL_BASED_OUTPATIENT_CLINIC_OR_DEPARTMENT_OTHER): Payer: Self-pay

## 2021-10-08 MED ORDER — OXYCODONE-ACETAMINOPHEN 5-325 MG PO TABS
1.0000 | ORAL_TABLET | Freq: Four times a day (QID) | ORAL | 0 refills | Status: DC | PRN
  Filled 2021-10-08: qty 15, 4d supply, fill #0

## 2021-10-08 MED ORDER — IBUPROFEN 800 MG PO TABS
800.0000 mg | ORAL_TABLET | Freq: Three times a day (TID) | ORAL | 0 refills | Status: DC | PRN
  Filled 2021-10-08: qty 30, 10d supply, fill #0

## 2021-10-08 NOTE — Telephone Encounter (Signed)
Please advise 

## 2021-10-09 NOTE — Progress Notes (Signed)
Subjective: [name] is a 51 y.o. is seen today in office s/p left foot Lapidus bunionectomy preformed on 09/08/2021.  States she been doing well.  She has noticed some bruising in the bottom of her foot and a small knot.  She does get some pain between her big toe and second toe.  She is more in the cam boot.  Started some swelling.  She finished the course of antibiotics.  She been working with restrictions.  Denies any fevers chills.  No chest pain shortness of breath.  No other concerns.   Objective: General: No acute distress, AAOx3  DP/PT pulses palpable 2/4, CRT < 3 sec to all digits.  Protective sensation intact. Motor function intact.  Left foot: Incision is well coapted without any evidence of dehiscence sutures intact.  Edema mildly to the hallux between first and second toe which is causing discomfort.  On the plantar aspect of the foot on the first metatarsal cuneiform joint there is some bruising present there is 1 very small pinpoint mobile soft tissue mass present.  There is no drainage or pus or any open sore present.  Does not appear to be fluctuant. No pain with calf compression, swelling, warmth, erythema.   Assessment and Plan:  Status post left foot Lapidus bunionectomy, doing well with no complications   -Treatment options discussed including all alternatives, risks, and complications -X-rays obtained reviewed.  Hardware intact any complicating factors.  Increased consolidation noted across the arthrodesis site.  No evidence of acute fracture. -Discussed that she can wash the foot with soap and water.  Dry thoroughly and apply antibiotic ointment and a bandage. -She can start to transition to partial weightbearing as tolerated around the house. -Continue nonweightbearing in cam boot -Ice, elevation -Monitor the area of bruising and not on the bottom of the foot. -Monitor for any clinical signs or symptoms of infection and directed to call the office immediately should  any occur or go to the ER.  Return in about 2 weeks (around 10/21/2021). X-ray next appointment   Trula Slade DPM

## 2021-10-13 NOTE — Progress Notes (Unsigned)
Office Visit Note  Patient: [name]             Date of Birth: 1970-06-12           MRN: [id number]             PCP: Biagio Borg, MD Referring: Biagio Borg, MD Visit Date: 10/26/2021 Occupation: _0 @  Subjective:  Generalized arthralgias   History of Present Illness: [name] is a 51 y.o. female with history of sarcoidosis.  She is currently on Methotrexate 0.27m sq injections once weekly and folic acid 248mby mouth daily.  Patient underwent a bunionectomy performed by Dr. WaJacqualyn Poseyn 09/08/2021.  She held methotrexate for about 1 week prior to surgery and restarted last Friday.  She states that since her last office visit she has been experiencing increased generalized joint pain and stiffness.  She has had increased discomfort in both hands and has noticed intermittent swelling in her knee joints.  She states she is woke up several times in the night with diffuse arthralgias which is something new for her.  She feels that her symptoms are exacerbated by the calf and therapy around the time of her surgery.  She states during the postoperative period she was taking ibuprofen 800 mg for pain relief which she has since discontinued after resuming methotrexate. She continues to experience an intermittent rash on her hands especially over her DIP joints as well as on the palmar aspect.  In the past she has tried using clobetasol cream which has been helpful.  She did not schedule appointment with dermatology after her last office visit in November 2022. She denies any signs or symptoms of uveitis.  She has noticed her vision has been worse with distant objects.  She has not yet scheduled an appointment with her ophthalmologist. She denies any recent infections.    Activities of Daily Living:  Patient reports morning stiffness for 5 minutes.   Patient Reports nocturnal pain.  Difficulty dressing/grooming: Reports Difficulty climbing stairs: Reports Difficulty getting out of  chair: Reports Difficulty using hands for taps, buttons, cutlery, and/or writing: Reports  Review of Systems  Constitutional:  Positive for fatigue.  HENT:  Positive for mouth dryness. Negative for mouth sores and nose dryness.   Eyes:  Positive for dryness. Negative for pain and visual disturbance.  Respiratory:  Negative for cough, hemoptysis and difficulty breathing.   Cardiovascular:  Positive for swelling in legs/feet. Negative for chest pain, palpitations and hypertension.  Gastrointestinal:  Positive for constipation and diarrhea. Negative for blood in stool.  Endocrine: Negative for increased urination.  Genitourinary:  Negative for difficulty urinating and painful urination.  Musculoskeletal:  Positive for joint pain, gait problem, joint pain, joint swelling, muscle weakness, morning stiffness and muscle tenderness. Negative for myalgias and myalgias.  Skin:  Positive for rash. Negative for color change, pallor, hair loss, nodules/bumps, skin tightness, ulcers and sensitivity to sunlight.  Allergic/Immunologic: Negative for susceptible to infections.  Neurological:  Negative for dizziness and headaches.  Hematological:  Negative for bruising/bleeding tendency and swollen glands.  Psychiatric/Behavioral:  Positive for sleep disturbance. Negative for depressed mood. The patient is not nervous/anxious.     PMFS History:  Patient Active Problem List   Diagnosis Date Noted   Bunion, left 08/06/2021   Morbid [place]/18/2023   Venous insufficiency 06/25/2021   Incisional hernia 12/25/2020   Mediastinal lymphadenopathy    Pulmonary nodules 10/20/2020   Multiple skin nodules 07/24/2020   Viral illness  06/14/2020   Right lumbar radiculopathy 74/94/4967   Periumbilical hernia 59/16/3846   TB lung, latent 05/18/2020   Acute non-recurrent maxillary sinusitis 11/09/2019   HTN (hypertension) 04/14/2019   Mass of left wrist 04/14/2019   External otitis of right ear 04/14/2019    Choledocholithiasis    Abnormal cholangiogram    Cholecystitis with cholelithiasis 07/24/2018   Hyperglycemia 10/16/2017   Hot flash not due to menopause 10/16/2017   Eczema 10/16/2017   Flu-like symptoms 06/20/2017   Cognitive changes 01/30/2017   Disturbed concentration 01/26/2017   Left shoulder pain 11/01/2016   Right low back pain 11/01/2016   Elevated blood pressure, situational 05/05/2016   Surgical menopause 01/29/2016   OSA on CPAP 12/22/2015   Cough 11/26/2015   Capsulitis 10/06/2015   Ganglion cyst of left foot 10/06/2015   Fatigue 09/13/2015   Asthma 09/08/2015   Exertional dyspnea 09/08/2015   Peroneal ganglion cyst 07/20/2015   Low back pain 07/20/2015   Peroneal tendinitis of left lower leg 06/25/2015   Nonallopathic lesion of cervical region 06/25/2015   Wheezing 05/08/2015   Polyarthralgia 05/08/2015   Peripheral edema 03/16/2015   Central line complication 65/99/3570   Morbid obesity with BMI of 45.0-49.9, adult ([place]) 02/28/2015   Chemotherapy induced neutropenia ([place]) 02/28/2015   Genetic testing 01/05/2015   Chemotherapy induced nausea and vomiting 01/02/2015   Chemotherapy-induced peripheral neuropathy ([place]) 01/02/2015   Premature surgical menopause 12/17/2014   Leukopenia due to antineoplastic chemotherapy ([place]) 12/17/2014   Port-A-Cath in place 12/17/2014   Encounter for antineoplastic chemotherapy 12/17/2014   Myalgia 12/09/2014   Hypersensitivity reaction 12/05/2014   Poor venous access 11/28/2014   Postoperative cellulitis of surgical wound 11/24/2014   Tobacco dependence 11/12/2014   Malignant neoplasm of both ovaries 11/10/2014   Anxiety 11/10/2014   Adnexal mass 11/10/2014   Encounter for well adult exam with abnormal findings 09/10/2014   Hypothyroidism 09/10/2014   Hypersomnolence 09/10/2014   Acute non-recurrent frontal sinusitis 06/24/2014   Right knee pain 06/24/2014   Lower back pain 01/08/2014   EAR PAIN, BILATERAL 12/18/2009    KNEE PAIN, RIGHT 12/18/2009   INGROWN TOENAIL 12/17/2008   BACK PAIN 12/17/2008   Acute bronchospasm 11/11/2008   Depression 11/29/2007   Insomnia 11/29/2007   Allergic rhinitis 04/13/2007   GERD 04/13/2007   Endometriosis determined by laparoscopy 04/13/2007   Migraine headache 01/29/2007    Past Medical History:  Diagnosis Date   Anxiety    Arthritis    Depression    Dyspnea    Elevated blood pressure, situational 05/05/2016   Endometriosis    Exercise-induced asthma 09/08/2015   GERD (gastroesophageal reflux disease)    History of kidney stones    Hypothyroidism    Migraine    Ovarian cancer, bilateral ([place]) 11/28/2014   PONV (postoperative nausea and vomiting)    Sleep apnea    Thyroid disease    Umbilical hernia    woke up while porta cath removed     Family History  Problem Relation Age of Onset   Hypertension Mother    Diabetes Mother    Stroke Mother        Deceased   Heart disease Mother    Osteoarthritis Mother    Atrial fibrillation Father        Living   Stroke Father        optic nerve left eye   Healthy Brother    Diabetes Maternal Aunt    Heart failure Maternal Grandmother  Heart attack Maternal Grandfather    Dementia Paternal Grandmother    Dementia Paternal Grandfather    Past Surgical History:  Procedure Laterality Date   ABDOMINAL HYSTERECTOMY  11/10/2014   at [facility], Exp lap, supracervical hyst, BSO, infracolic omentectomy, lymphadenectomy, aortic lymph node sampling   BRONCHIAL NEEDLE ASPIRATION BIOPSY  12/16/2020   Procedure: BRONCHIAL NEEDLE ASPIRATION BIOPSIES;  Surgeon: Rigoberto Noel, MD;  Location: [place];  Service: Cardiopulmonary;;   BRONCHIAL WASHINGS  12/16/2020   Procedure: BRONCHIAL WASHINGS;  Surgeon: Rigoberto Noel, MD;  Location: [place];  Service: Cardiopulmonary;;   BUNIONECTOMY Left 09/14/2021   CHOLECYSTECTOMY N/A 07/24/2018   Procedure: LAPAROSCOPIC CHOLECYSTECTOMY WITH INTRAOPERATIVE CHOLANGIOGRAM;   Surgeon: Armandina Gemma, MD;  Location: [facility];  Service: General;  Laterality: N/A;   ERCP N/A 07/26/2018   Procedure: ENDOSCOPIC RETROGRADE CHOLANGIOPANCREATOGRAPHY (ERCP);  Surgeon: Ladene Artist, MD;  Location: [facility];  Service: Endoscopy;  Laterality: N/A;   GANGLION CYST EXCISION Right    hand   INCISIONAL HERNIA REPAIR N/A 12/25/2020   Procedure: LAPAROSCOPIC INCISIONAL HERNIA REPAIR WITH MESH;  Surgeon: Kinsinger, Arta Bruce, MD;  Location: [facility];  Service: General;  Laterality: N/A;   IR GENERIC HISTORICAL  05/12/2016   IR REMOVAL TUN ACCESS W/ PORT W/O FL MOD SED 05/12/2016 [facility]   LIPOMA EXCISION Left    ankle   MENISCUS REPAIR Right    Porta a cath removal     PORTA CATH INSERTION     REMOVAL OF STONES  07/26/2018   Procedure: REMOVAL OF STONES;  Surgeon: Ladene Artist, MD;  Location: [facility];  Service: Endoscopy;;   SPHINCTEROTOMY  07/26/2018   Procedure: Joan Mayans;  Surgeon: Ladene Artist, MD;  Location: [facility];  Service: Endoscopy;;   VIDEO BRONCHOSCOPY WITH ENDOBRONCHIAL ULTRASOUND N/A 12/16/2020   Procedure: VIDEO BRONCHOSCOPY WITH ENDOBRONCHIAL ULTRASOUND;  Surgeon: Rigoberto Noel, MD;  Location: [place];  Service: Cardiopulmonary;  Laterality: N/A;   Social History   Social History Narrative   Lives with boyfriend in a one story home.  Has no children.     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     Education: college.   Right handed    Immunization History  Administered Date(s) Administered   Influenza,inj,Quad PF,6+ Mos 01/02/2014, 03/26/2015, 12/25/2017, 01/21/2020, 01/25/2021   Influenza-Unspecified 02/23/2016, 02/09/2017, 02/12/2020   MMR 02/09/2017, 07/03/2017   PFIZER(Purple Top)SARS-COV-2 Vaccination 08/01/2019, 08/22/2019, 02/22/2020   Tdap 11/07/2017   Tetanus 12/21/2015   Zoster Recombinat (Shingrix) 06/22/2021     Objective: Vital Signs: BP 132/85 (BP Location: Left Arm, Patient Position: Sitting, Cuff Size: Large)    Pulse 91   Resp 13   Ht _0  (1.702 m)   Wt (!) 338 lb 9.6 oz (153.6 kg)   LMP 11/02/2014   BMI 53.03 kg/m    Physical Exam Vitals and nursing note reviewed.  Constitutional:      Appearance: She is well-developed.  HENT:     Head: Normocephalic and atraumatic.  Eyes:     Conjunctiva/sclera: Conjunctivae normal.  Cardiovascular:     Rate and Rhythm: Normal rate and regular rhythm.     Heart sounds: Normal heart sounds.  Pulmonary:     Effort: Pulmonary effort is normal.     Breath sounds: Normal breath sounds.  Abdominal:     General: Bowel sounds are normal.     Palpations: Abdomen is soft.  Musculoskeletal:     Cervical back: Normal range of motion.  Skin:    General: Skin is warm and dry.     Capillary Refill: Capillary refill takes less than 2 seconds.  Neurological:     Mental Status: She is alert and oriented to person, place, and time.  Psychiatric:        Behavior: Behavior normal.      Musculoskeletal Exam: C-spine has good range of motion with no discomfort.  Shoulder joints have good range of motion with no discomfort.  Painful extension of both elbows with tenderness over the lateral epicondyle of both elbows.  No elbow joint line tenderness noted.  No evidence of olecranon bursitis noted.  Wrist joints, MCPs, PIPs, and DIPs good ROM with no synovitis.  Complete fist formation.  Some tenderness over PIP and DIP joints.  Hip joints difficult to assess in seated position.  Painful range of motion of both knee joints with limited extension of her right knee.  Pedal edema noted bilaterally.  Left lower extremity in walking boot.    CDAI Exam: CDAI Score: -- Patient Global: --; Provider Global: -- Swollen: --; Tender: -- Joint Exam 10/26/2021   No joint exam has been documented for this visit   There is currently no information documented on the homunculus. Go to the Rheumatology activity and complete the homunculus joint exam.  Investigation: No additional  findings.  Imaging: DG Foot Complete Left  Result Date: 10/20/2021 Please see detailed radiograph report in office note.  DG Foot Complete Left  Result Date: 10/05/2021 Please see detailed radiograph report in office note.   Recent Labs: Lab Results  Component Value Date   WBC 11.6 (H) 06/01/2021   HGB 14.5 06/01/2021   PLT 344 06/01/2021   NA 138 06/01/2021   K 3.9 06/01/2021   CL 101 06/01/2021   CO2 34 (H) 06/01/2021   GLUCOSE 89 06/01/2021   BUN 10 06/01/2021   CREATININE 0.79 06/01/2021   BILITOT 0.8 06/01/2021   ALKPHOS 56 06/15/2020   AST 18 06/01/2021   ALT 21 06/01/2021   PROT 7.8 06/01/2021   ALBUMIN 4.3 06/15/2020   CALCIUM 9.6 06/01/2021   GFRAA 110 11/11/2020   QFTBGOLDPLUS NEGATIVE 11/11/2020    Speciality Comments: No specialty comments available.  Procedures:  No procedures performed Allergies: Moxifloxacin, Quinolones, Doxycycline, Escitalopram oxalate, Lisinopril, and Sulfonamide derivatives   Assessment / Plan:     Visit Diagnoses: Sarcoidosis - Progression of mediastinal and hilar lymph nodes on the CT scan which improved after prednisone, subjective history of EN lesions, right parotid swelling, lymph node biopsy negative for granulomatosis. Followed by Dr. Elsworth Soho.    Patient was last seen in the office on 06/01/2021.  She is prescribed methotrexate 0.8 mL sq injections once weekly and folic acid 2 mg daily.  She had a gap in therapy for about 6 weeks due to undergoing a bunionectomy performed by Dr. Earleen Newport on 09/08/2021.  She resumed methotrexate on Friday.  She has noticed increased generalized arthralgias and joint stiffness which was exacerbated with the gap in therapy.  She is willing to continue on methotrexate as prescribed and reevaluate if her symptoms do not improve.  No synovitis was noted on examination today.  She was advised to notify us if her symptoms progress or worsen at which time we can schedule an ultrasound of both hands to assess for  synovitis as well as obtain updated autoimmune lab work.  She was in agreement.   I also advised the patient to schedule a routine follow-up with her  ophthalmologist.  Discussed that given her history of sarcoidosis she should be seeing ophthalmology on a yearly basis.   She will follow-up in the office in 3 months or sooner if needed.  Positive ANA (antinuclear antibody) - Positive ANA, positive sicca symptoms and oral ulcers. AVISE lupus index -1.1, ANA 1: 160NS, antiphosphatidylserine IgM positive.   Sialolithiasis: Maxillofacial CT obtained on 03/27/2021: Subtle inflammation of the right parotid gland compatible with mild sialoadenitis.  Previously treated with cefuroxime and Flagyl. She has been followed by ENT.  No recurrence.  High risk medication use - Methotrexate 0.33m sq injections once weekly and folic acid 252mby mouth daily.  CBC and CMP were drawn on 06/01/2021.  She is overdue to update lab work.  Orders for CBC and CMP were released.  Her next lab work will be due in September and every 3 months to monitor for toxicity.  Standing orders for CBC and CMP remain in place.- Plan: CBC with Differential/Platelet, COMPLETE METABOLIC PANEL WITH GFR She has not had any recent infections.  Discussed the importance of holding methotrexate if she develops signs or symptoms of an infection and to resume once the infection is completely cleared.  Primary osteoarthritis of both knees - Chronic pain.  She has difficulty climbing steps as well as rising from a seated position due to the severity of pain and stiffness in her knee joints.  She has noticed intermittent swelling in both knees recently.  Limited extension of the right knee joint.  No knee joint effusion was noted on examination today.  She was advised to notify usKoreaf her symptoms persist or worsen.  Bilateral carpal tunnel syndrome - Asymptomatic at this time.   S/P bunionectomy - left-Performed by Dr. WaJacqualyn Poseyn 09/08/21. Held MTX for about  6 weeks around the time of surgery. No complications. Currently in Cam boot.    Rash: Intermittent-both hands.  Raises the concern for possible eczema.  Responsive to clobetasol cream.  Refill sent to the pharmacy today.  Referral to dermatology was placed on 03/30/2021 but the patient did not schedule appointment since her voicemail box was full at that time.  A new referral for dermatology will be placed today for further evaluation due to the chronicity of the rash.  Other medical conditions are listed as follows:   Right lumbar radiculopathy  Malignant neoplasm of both ovaries (HCC):11/09/2014 TAHBSO, CTX X 6 months.  Previously received clearance to initiate immunosuppressive therapy.   Chemotherapy-induced peripheral neuropathy (HCC)  History of gastroesophageal reflux (GERD)  Primary hypertension  Migraine without aura and with status migrainosus, not intractable  Acquired hypothyroidism  Mild intermittent asthma with acute exacerbation  OSA on CPAP  Multiple lung nodules  Other fatigue  TB lung, latent  Surgical menopause  Orders: Orders Placed This Encounter  Procedures   CBC with Differential/Platelet   COMPLETE METABOLIC PANEL WITH GFR   Meds ordered this encounter  Medications   methotrexate 50 MG/2ML injection    Sig: Inject 0.8 mLs (20 mg total) into the skin once a week.    Dispense:  10 mL    Refill:  0   clobetasol cream (TEMOVATE) 0.05 %    Sig: Apply 1 Application topically 2 (two) times daily as needed.    Dispense:  45 g    Refill:  0    Follow-Up Instructions: Return in about 3 months (around 01/26/2022) for Sarcoidosis.   TaOfilia NeasPA-C  Note - This record has been created using  Editor, commissioning.  Chart creation errors have been sought, but may not always  have been located. Such creation errors do not reflect on  the standard of medical care.

## 2021-10-15 ENCOUNTER — Ambulatory Visit: Payer: No Typology Code available for payment source | Admitting: Neurology

## 2021-10-18 ENCOUNTER — Other Ambulatory Visit (HOSPITAL_BASED_OUTPATIENT_CLINIC_OR_DEPARTMENT_OTHER): Payer: Self-pay

## 2021-10-19 ENCOUNTER — Other Ambulatory Visit (HOSPITAL_BASED_OUTPATIENT_CLINIC_OR_DEPARTMENT_OTHER): Payer: Self-pay

## 2021-10-21 NOTE — Progress Notes (Signed)
Attempted to obtain medical history via telephone, unable to reach at this time. HIPAA compliant voicemail message left requesting return call to pre surgical testing department. 

## 2021-10-22 ENCOUNTER — Ambulatory Visit (INDEPENDENT_AMBULATORY_CARE_PROVIDER_SITE_OTHER): Payer: 59 | Admitting: Podiatry

## 2021-10-22 ENCOUNTER — Ambulatory Visit (INDEPENDENT_AMBULATORY_CARE_PROVIDER_SITE_OTHER): Payer: 59

## 2021-10-22 DIAGNOSIS — M25372 Other instability, left ankle: Secondary | ICD-10-CM

## 2021-10-22 DIAGNOSIS — M21612 Bunion of left foot: Secondary | ICD-10-CM

## 2021-10-22 DIAGNOSIS — Z9889 Other specified postprocedural states: Secondary | ICD-10-CM | POA: Diagnosis not present

## 2021-10-22 NOTE — Progress Notes (Signed)
Subjective: [name] is a 51 y.o. is seen today in office s/p left foot Lapidus bunionectomy preformed on 09/08/2021.  States overall she been doing well.  She gets symptoms on the incision but overall her pain is improving and she is having some mild swelling.  No recent injuries.  She is been using the cam boot.  No fevers or chills.  No other concerns.  Objective: General: No acute distress, AAOx3  DP/PT pulses palpable 2/4, CRT < 3 sec to all digits.  Protective sensation intact. Motor function intact.  Left foot: Incision is well coapted without any evidence of dehiscence.  There is some scabbing present the proximal aspect the incision but there is no drainage or pus or active bleeding.  There is minimal edema at surgical site there is no erythema or warmth.  No pain with imaging range of motion.  To the discussion today she does report some tenderness on the ankle when she tries to be active and this is an ongoing prior to the surgery when the lateral aspect ankle.  She said a lipoma that was removed.  She does feel some instability at times when she is trying to become active but she is trying to walk more and bike.   The bruising that was present previously the plantar aspect the foot has resolved. No pain with calf compression, swelling, warmth, erythema.   Assessment and Plan:  Status post left foot Lapidus bunionectomy, doing well with no complications   -Treatment options discussed including all alternatives, risks, and complications -X-rays obtained reviewed.  Hardware intact any complicating factors.  Increased consolidation noted across the arthrodesis site.  No evidence of acute fracture. -X-rays obtained reviewed of the left foot.  3 views were obtained.  Notes of acute fracture.  There is increased consolidation noted across the arthrodesis site. -At this time we discussed starting physical therapy and referral was placed.  Continue cam boot for now but transition to  weightbearing as tolerated in the cam boot.  As she starts physical therapy and she is able to walk in the boot, can transition to regular shoe as able. -Ice, elevation -Monitor for any clinical signs or symptoms of infection and directed to call the office immediately should any occur or go to the ER.  Return in about 3 weeks (around 11/12/2021). X-ray next appointment   Vivi Barrack DPM

## 2021-10-26 ENCOUNTER — Other Ambulatory Visit (HOSPITAL_BASED_OUTPATIENT_CLINIC_OR_DEPARTMENT_OTHER): Payer: Self-pay

## 2021-10-26 ENCOUNTER — Encounter: Payer: Self-pay | Admitting: Physician Assistant

## 2021-10-26 ENCOUNTER — Ambulatory Visit: Payer: 59 | Admitting: Physician Assistant

## 2021-10-26 VITALS — BP 132/85 | HR 91 | Resp 13 | Ht 67.0 in | Wt 338.6 lb

## 2021-10-26 DIAGNOSIS — Z79899 Other long term (current) drug therapy: Secondary | ICD-10-CM | POA: Diagnosis not present

## 2021-10-26 DIAGNOSIS — M170 Bilateral primary osteoarthritis of knee: Secondary | ICD-10-CM

## 2021-10-26 DIAGNOSIS — Z227 Latent tuberculosis: Secondary | ICD-10-CM

## 2021-10-26 DIAGNOSIS — C563 Malignant neoplasm of bilateral ovaries: Secondary | ICD-10-CM | POA: Diagnosis not present

## 2021-10-26 DIAGNOSIS — G43001 Migraine without aura, not intractable, with status migrainosus: Secondary | ICD-10-CM

## 2021-10-26 DIAGNOSIS — T451X5A Adverse effect of antineoplastic and immunosuppressive drugs, initial encounter: Secondary | ICD-10-CM

## 2021-10-26 DIAGNOSIS — G5603 Carpal tunnel syndrome, bilateral upper limbs: Secondary | ICD-10-CM | POA: Diagnosis not present

## 2021-10-26 DIAGNOSIS — E8940 Asymptomatic postprocedural ovarian failure: Secondary | ICD-10-CM

## 2021-10-26 DIAGNOSIS — I10 Essential (primary) hypertension: Secondary | ICD-10-CM

## 2021-10-26 DIAGNOSIS — G4733 Obstructive sleep apnea (adult) (pediatric): Secondary | ICD-10-CM

## 2021-10-26 DIAGNOSIS — G620 Drug-induced polyneuropathy: Secondary | ICD-10-CM

## 2021-10-26 DIAGNOSIS — Z9889 Other specified postprocedural states: Secondary | ICD-10-CM | POA: Diagnosis not present

## 2021-10-26 DIAGNOSIS — Z9989 Dependence on other enabling machines and devices: Secondary | ICD-10-CM

## 2021-10-26 DIAGNOSIS — K115 Sialolithiasis: Secondary | ICD-10-CM

## 2021-10-26 DIAGNOSIS — M5416 Radiculopathy, lumbar region: Secondary | ICD-10-CM

## 2021-10-26 DIAGNOSIS — R7689 Other specified abnormal immunological findings in serum: Secondary | ICD-10-CM

## 2021-10-26 DIAGNOSIS — D869 Sarcoidosis, unspecified: Secondary | ICD-10-CM

## 2021-10-26 DIAGNOSIS — R5383 Other fatigue: Secondary | ICD-10-CM

## 2021-10-26 DIAGNOSIS — D8689 Sarcoidosis of other sites: Secondary | ICD-10-CM | POA: Diagnosis not present

## 2021-10-26 DIAGNOSIS — R768 Other specified abnormal immunological findings in serum: Secondary | ICD-10-CM | POA: Diagnosis not present

## 2021-10-26 DIAGNOSIS — J4521 Mild intermittent asthma with (acute) exacerbation: Secondary | ICD-10-CM

## 2021-10-26 DIAGNOSIS — Z8719 Personal history of other diseases of the digestive system: Secondary | ICD-10-CM

## 2021-10-26 DIAGNOSIS — R918 Other nonspecific abnormal finding of lung field: Secondary | ICD-10-CM

## 2021-10-26 DIAGNOSIS — R21 Rash and other nonspecific skin eruption: Secondary | ICD-10-CM

## 2021-10-26 DIAGNOSIS — E039 Hypothyroidism, unspecified: Secondary | ICD-10-CM

## 2021-10-26 MED ORDER — CLOBETASOL PROPIONATE 0.05 % EX CREA
1.0000 | TOPICAL_CREAM | Freq: Two times a day (BID) | CUTANEOUS | 0 refills | Status: DC | PRN
  Filled 2021-10-26: qty 45, 14d supply, fill #0

## 2021-10-26 MED ORDER — METHOTREXATE SODIUM CHEMO INJECTION 50 MG/2ML
20.0000 mg | INTRAMUSCULAR | 0 refills | Status: DC
  Filled 2021-10-26: qty 10, 84d supply, fill #0

## 2021-10-26 NOTE — Patient Instructions (Signed)
Standing Labs We placed an order today for your standing lab work.   Please have your standing labs drawn in September and every 3 months   If possible, please have your labs drawn 2 weeks prior to your appointment so that the provider can discuss your results at your appointment.  Please note that you may see your imaging and lab results in MyChart before we have reviewed them. We may be awaiting multiple results to interpret others before contacting you. Please allow our office up to 72 hours to thoroughly review all of the results before contacting the office for clarification of your results.  We have open lab daily: Monday through Thursday from 1:30-4:30 PM and Friday from 1:30-4:00 PM at the office of Dr. Shaili Deveshwar, [facility].   Please be advised, all patients with office appointments requiring lab work will take precedent over walk-in lab work.  If possible, please come for your lab work on Monday and Friday afternoons, as you may experience shorter wait times. The office is located at [address], [address], [place], [place] [address] No appointment is necessary.   Labs are drawn by [facility]. Please bring your co-pay at the time of your lab draw.  You may receive a bill from [facility] for your lab work.  Please note if you are on Hydroxychloroquine and and an order has been placed for a Hydroxychloroquine level, you will need to have it drawn 4 hours or more after your last dose.  If you wish to have your labs drawn at another location, please call the office 24 hours in advance to send orders.  If you have any questions regarding directions or hours of operation,  please call [phone].   As a reminder, please drink plenty of water prior to coming for your lab work. Thanks!  

## 2021-10-26 NOTE — Addendum Note (Signed)
Addended by: Shona Needles on: 10/26/2021 06:14 PM   Modules accepted: Orders

## 2021-10-27 LAB — COMPLETE METABOLIC PANEL WITH GFR
AG Ratio: 1.7 (calc) (ref 1.0–2.5)
ALT: 20 U/L (ref 6–29)
AST: 15 U/L (ref 10–35)
Albumin: 4.7 g/dL (ref 3.6–5.1)
Alkaline phosphatase (APISO): 54 U/L (ref 37–153)
BUN: 15 mg/dL (ref 7–25)
CO2: 28 mmol/L (ref 20–32)
Calcium: 10 mg/dL (ref 8.6–10.4)
Chloride: 101 mmol/L (ref 98–110)
Creat: 0.8 mg/dL (ref 0.50–1.03)
Globulin: 2.8 g/dL (calc) (ref 1.9–3.7)
Glucose, Bld: 89 mg/dL (ref 65–99)
Potassium: 4 mmol/L (ref 3.5–5.3)
Sodium: 138 mmol/L (ref 135–146)
Total Bilirubin: 0.8 mg/dL (ref 0.2–1.2)
Total Protein: 7.5 g/dL (ref 6.1–8.1)
eGFR: 90 mL/min/{1.73_m2} (ref >=60)

## 2021-10-27 LAB — CBC WITH DIFFERENTIAL/PLATELET
Absolute Monocytes: 538 cells/uL (ref 200–950)
Basophils Absolute: 26 cells/uL (ref 0–200)
Basophils Relative: 0.2 %
Eosinophils Absolute: 256 cells/uL (ref 15–500)
Eosinophils Relative: 2 %
HCT: 40.4 % (ref 35.0–45.0)
Hemoglobin: 14.1 g/dL (ref 11.7–15.5)
Lymphs Abs: 3392 cells/uL (ref 850–3900)
MCH: 31.6 pg (ref 27.0–33.0)
MCHC: 34.9 g/dL (ref 32.0–36.0)
MCV: 90.6 fL (ref 80.0–100.0)
MPV: 10.6 fL (ref 7.5–12.5)
Monocytes Relative: 4.2 %
Neutro Abs: 8589 cells/uL — ABNORMAL HIGH (ref 1500–7800)
Neutrophils Relative %: 67.1 %
Platelets: 311 10*3/uL (ref 140–400)
RBC: 4.46 10*6/uL (ref 3.80–5.10)
RDW: 12.5 % (ref 11.0–15.0)
Total Lymphocyte: 26.5 %
WBC: 12.8 10*3/uL — ABNORMAL HIGH (ref 3.8–10.8)

## 2021-10-27 NOTE — Progress Notes (Signed)
WBC count is slightly elevated-12.8.  absolute neutrophils are elevated. Please clarify if she has had a recent infection. Rest of CBC WNL.  CMP WNL.

## 2021-10-29 ENCOUNTER — Other Ambulatory Visit (HOSPITAL_BASED_OUTPATIENT_CLINIC_OR_DEPARTMENT_OTHER): Payer: Self-pay

## 2021-10-29 ENCOUNTER — Telehealth: Payer: Self-pay | Admitting: *Deleted

## 2021-10-29 ENCOUNTER — Other Ambulatory Visit: Payer: Self-pay | Admitting: Internal Medicine

## 2021-10-29 ENCOUNTER — Other Ambulatory Visit: Payer: Self-pay | Admitting: Gynecologic Oncology

## 2021-10-29 ENCOUNTER — Other Ambulatory Visit: Payer: Self-pay | Admitting: Neurology

## 2021-10-29 DIAGNOSIS — E8940 Asymptomatic postprocedural ovarian failure: Secondary | ICD-10-CM

## 2021-10-29 DIAGNOSIS — C563 Malignant neoplasm of bilateral ovaries: Secondary | ICD-10-CM

## 2021-10-29 MED ORDER — CYCLOBENZAPRINE HCL 5 MG PO TABS
5.0000 mg | ORAL_TABLET | Freq: Every evening | ORAL | 1 refills | Status: DC | PRN
  Filled 2021-10-29: qty 90, 90d supply, fill #0

## 2021-10-29 MED ORDER — POLYVINYL ALCOHOL 1.4 % OP SOLN
1.0000 [drp] | OPHTHALMIC | 0 refills | Status: AC | PRN
Start: 2021-10-29 — End: ?
  Filled 2021-10-29: qty 15, fill #0

## 2021-10-29 MED ORDER — ESTRADIOL 0.1 MG/24HR TD PTTW
1.0000 | MEDICATED_PATCH | TRANSDERMAL | 0 refills | Status: DC
  Filled 2021-10-29: qty 8, 28d supply, fill #0

## 2021-10-29 NOTE — Telephone Encounter (Signed)
Unable to reach pt regarding her request for refill for estradiol patch. LVM for return call.

## 2021-10-29 NOTE — Telephone Encounter (Signed)
Spoke with pt this afternoon who stated that she's completely out of her estradiol patch. Informed her that since she's been released by our office she will need to follow up with her provider for the refill. We can give her a 1 month supply but she's have to get it from her provider from now on. Pt verbalized understanding. Warner Mccreedy, NP notified.

## 2021-11-03 NOTE — Anesthesia Preprocedure Evaluation (Signed)
Anesthesia Evaluation  Patient identified by MRN, date of birth, ID band Patient awake    Reviewed: Allergy & Precautions, NPO status , Patient's Chart, lab work & pertinent test results  History of Anesthesia Complications (+) PONV and history of anesthetic complications  Airway Mallampati: III  TM Distance: >3 FB Neck ROM: Full    Dental no notable dental hx.    Pulmonary asthma , sleep apnea and Continuous Positive Airway Pressure Ventilation ,    Pulmonary exam normal        Cardiovascular hypertension, Pt. on medications Normal cardiovascular exam Rhythm:Regular Rate:Normal     Neuro/Psych  Headaches, PSYCHIATRIC DISORDERS Anxiety Depression  Neuromuscular disease    GI/Hepatic Neg liver ROS, GERD  Medicated and Controlled,  Endo/Other  Hypothyroidism Morbid obesity  Renal/GU negative Renal ROS     Musculoskeletal  (+) Arthritis ,   Abdominal (+) + obese,   Peds  Hematology negative hematology ROS (+)   Anesthesia Other Findings   Reproductive/Obstetrics S/p hysterectomy                            Anesthesia Physical  Anesthesia Plan  ASA: 3  Anesthesia Plan: MAC   Post-op Pain Management: Minimal or no pain anticipated   Induction:   PONV Risk Score and Plan: 4 or greater and 3 and Ondansetron, Midazolam, Treatment may vary due to age or medical condition, TIVA and Propofol infusion  Airway Management Planned: Simple Face Mask  Additional Equipment:   Intra-op Plan:   Post-operative Plan:   Informed Consent: I have reviewed the patients History and Physical, chart, labs and discussed the procedure including the risks, benefits and alternatives for the proposed anesthesia with the patient or authorized representative who has indicated his/her understanding and acceptance.     Dental advisory given  Plan Discussed with: Anesthesiologist and CRNA  Anesthesia Plan  Comments:        Anesthesia Quick Evaluation

## 2021-11-04 ENCOUNTER — Ambulatory Visit (HOSPITAL_COMMUNITY): Payer: 59 | Admitting: Anesthesiology

## 2021-11-04 ENCOUNTER — Ambulatory Visit (HOSPITAL_BASED_OUTPATIENT_CLINIC_OR_DEPARTMENT_OTHER): Payer: 59 | Admitting: Anesthesiology

## 2021-11-04 ENCOUNTER — Ambulatory Visit (HOSPITAL_COMMUNITY)
Admission: RE | Admit: 2021-11-04 | Discharge: 2021-11-04 | Disposition: A | Discharge status: Home / Self Care | Payer: 59 | Attending: Gastroenterology | Admitting: Gastroenterology

## 2021-11-04 ENCOUNTER — Other Ambulatory Visit: Payer: Self-pay

## 2021-11-04 ENCOUNTER — Encounter (HOSPITAL_COMMUNITY)
Admission: RE | Disposition: A | Discharge status: Home / Self Care | Payer: Self-pay | Source: Home / Self Care | Attending: Gastroenterology

## 2021-11-04 DIAGNOSIS — G4733 Obstructive sleep apnea (adult) (pediatric): Secondary | ICD-10-CM | POA: Diagnosis not present

## 2021-11-04 DIAGNOSIS — K635 Polyp of colon: Secondary | ICD-10-CM | POA: Insufficient documentation

## 2021-11-04 DIAGNOSIS — Z1211 Encounter for screening for malignant neoplasm of colon: Secondary | ICD-10-CM

## 2021-11-04 DIAGNOSIS — K2289 Other specified disease of esophagus: Secondary | ICD-10-CM

## 2021-11-04 DIAGNOSIS — K219 Gastro-esophageal reflux disease without esophagitis: Secondary | ICD-10-CM | POA: Diagnosis not present

## 2021-11-04 DIAGNOSIS — K640 First degree hemorrhoids: Secondary | ICD-10-CM | POA: Insufficient documentation

## 2021-11-04 DIAGNOSIS — Z9989 Dependence on other enabling machines and devices: Secondary | ICD-10-CM | POA: Diagnosis not present

## 2021-11-04 DIAGNOSIS — G4730 Sleep apnea, unspecified: Secondary | ICD-10-CM | POA: Insufficient documentation

## 2021-11-04 DIAGNOSIS — K2970 Gastritis, unspecified, without bleeding: Secondary | ICD-10-CM | POA: Insufficient documentation

## 2021-11-04 DIAGNOSIS — R194 Change in bowel habit: Secondary | ICD-10-CM | POA: Diagnosis not present

## 2021-11-04 DIAGNOSIS — K319 Disease of stomach and duodenum, unspecified: Secondary | ICD-10-CM | POA: Diagnosis not present

## 2021-11-04 DIAGNOSIS — I10 Essential (primary) hypertension: Secondary | ICD-10-CM | POA: Insufficient documentation

## 2021-11-04 DIAGNOSIS — M1990 Unspecified osteoarthritis, unspecified site: Secondary | ICD-10-CM | POA: Diagnosis not present

## 2021-11-04 DIAGNOSIS — F32A Depression, unspecified: Secondary | ICD-10-CM | POA: Insufficient documentation

## 2021-11-04 DIAGNOSIS — K2270 Barrett's esophagus without dysplasia: Secondary | ICD-10-CM | POA: Insufficient documentation

## 2021-11-04 DIAGNOSIS — R110 Nausea: Secondary | ICD-10-CM

## 2021-11-04 DIAGNOSIS — Z6841 Body Mass Index (BMI) 40.0 and over, adult: Secondary | ICD-10-CM | POA: Insufficient documentation

## 2021-11-04 DIAGNOSIS — F419 Anxiety disorder, unspecified: Secondary | ICD-10-CM | POA: Insufficient documentation

## 2021-11-04 DIAGNOSIS — J45909 Unspecified asthma, uncomplicated: Secondary | ICD-10-CM | POA: Diagnosis not present

## 2021-11-04 DIAGNOSIS — Z1212 Encounter for screening for malignant neoplasm of rectum: Secondary | ICD-10-CM

## 2021-11-04 DIAGNOSIS — G709 Myoneural disorder, unspecified: Secondary | ICD-10-CM | POA: Insufficient documentation

## 2021-11-04 DIAGNOSIS — D124 Benign neoplasm of descending colon: Secondary | ICD-10-CM | POA: Diagnosis not present

## 2021-11-04 DIAGNOSIS — R1084 Generalized abdominal pain: Secondary | ICD-10-CM | POA: Diagnosis not present

## 2021-11-04 DIAGNOSIS — E039 Hypothyroidism, unspecified: Secondary | ICD-10-CM | POA: Insufficient documentation

## 2021-11-04 DIAGNOSIS — Z79899 Other long term (current) drug therapy: Secondary | ICD-10-CM | POA: Insufficient documentation

## 2021-11-04 HISTORY — PX: BIOPSY: SHX5522

## 2021-11-04 HISTORY — PX: POLYPECTOMY: SHX5525

## 2021-11-04 HISTORY — PX: ESOPHAGOGASTRODUODENOSCOPY (EGD) WITH PROPOFOL: SHX5813

## 2021-11-04 HISTORY — PX: COLONOSCOPY WITH PROPOFOL: SHX5780

## 2021-11-04 SURGERY — ESOPHAGOGASTRODUODENOSCOPY (EGD) WITH PROPOFOL
Anesthesia: Monitor Anesthesia Care

## 2021-11-04 MED ORDER — PROPOFOL 500 MG/50ML IV EMUL
INTRAVENOUS | Status: AC
  Filled 2021-11-04: qty 50

## 2021-11-04 MED ORDER — PROPOFOL 500 MG/50ML IV EMUL
INTRAVENOUS | Status: DC | PRN
  Administered 2021-11-04: 10 mg via INTRAVENOUS
  Administered 2021-11-04: 140 ug/kg/min via INTRAVENOUS

## 2021-11-04 MED ORDER — LACTATED RINGERS IV SOLN: INTRAVENOUS | Status: DC

## 2021-11-04 MED ORDER — LIDOCAINE 2% (20 MG/ML) 5 ML SYRINGE
INTRAMUSCULAR | Status: DC | PRN
  Administered 2021-11-04: 60 mg via INTRAVENOUS

## 2021-11-04 MED ORDER — ONDANSETRON HCL 4 MG/2ML IJ SOLN
INTRAMUSCULAR | Status: DC | PRN
  Administered 2021-11-04: 4 mg via INTRAVENOUS

## 2021-11-04 MED ORDER — PROPOFOL 1000 MG/100ML IV EMUL
INTRAVENOUS | Status: AC
  Filled 2021-11-04: qty 100

## 2021-11-04 MED ORDER — SODIUM CHLORIDE 0.9 % IV SOLN: INTRAVENOUS | Status: DC

## 2021-11-04 SURGICAL SUPPLY — 25 items

## 2021-11-04 NOTE — Op Note (Signed)
[facility] Patient Name: [name] Procedure Date: 11/04/2021 MRN: [id number] Attending MD: Jackquline Denmark , MD Date of Birth: 10-18-1970 CSN: [id number] Age: 51 Admit Type: Outpatient Procedure:                Colonoscopy Indications:              Screening for colorectal malignant neoplasm Providers:                Jackquline Denmark, MD, Mikey College, RN, Gloris Ham, Technician Referring MD:             Jackquline Denmark, MD Medicines:                Monitored Anesthesia Care Complications:            No immediate complications. Estimated Blood Loss:     Estimated blood loss: none. Procedure:                Pre-Anesthesia Assessment:                           - Prior to the procedure, a History and Physical                            was performed, and patient medications and                            allergies were reviewed. The patient's tolerance of                            previous anesthesia was also reviewed. The risks                            and benefits of the procedure and the sedation                            options and risks were discussed with the patient.                            All questions were answered, and informed consent                            was obtained. Prior Anticoagulants: The patient has                            taken no previous anticoagulant or antiplatelet                            agents. ASA Grade Assessment: II - A patient with                            mild systemic disease. After reviewing the risks  and benefits, the patient was deemed in                            satisfactory condition to undergo the procedure.                           After obtaining informed consent, the colonoscope                            was passed under direct vision. Throughout the                            procedure, the patient's blood pressure, pulse, and                             oxygen saturations were monitored continuously. The                            CF-HQ190L ([id number]) Olympus colonoscope was                            introduced through the anus and advanced to the the                            cecum, identified by appendiceal orifice and                            ileocecal valve. The colonoscopy was performed                            without difficulty. The patient tolerated the                            procedure well. The quality of the bowel                            preparation was good except in the cecum where                            there was adherent stool which could not be fully                            washed. The ileocecal valve, appendiceal orifice,                            and rectum were photographed. Scope In: 9:04:57 AM Scope Out: 9:24:06 AM Scope Withdrawal Time: 0 hours 14 minutes 21 seconds  Total Procedure Duration: 0 hours 19 minutes 9 seconds  Findings:      A 4 mm polyp was found in the mid descending colon. The polyp was       sessile. The polyp was removed with a cold snare. Resection complete but       not retrived.      Non-bleeding internal hemorrhoids were found during retroflexion. The  hemorrhoids were small and Grade I (internal hemorrhoids that do not       prolapse).      The exam was otherwise without abnormality on direct and retroflexion       views. Impression:               - One 4 mm polyp in the mid descending colon,                            removed with a cold snare. Resected.                           - Non-bleeding internal hemorrhoids.                           - The examination was otherwise normal on direct                            and retroflexion views. Moderate Sedation:      Not Applicable - Patient had care per Anesthesia. Recommendation:           - Patient has a contact number available for                            emergencies. The signs and symptoms of potential                             delayed complications were discussed with the                            patient. Return to normal activities tomorrow.                            Written discharge instructions were provided to the                            patient.                           - Resume previous diet.                           - Continue present medications.                           - Repeat colonoscopy in 5 years for screening                            purposes.                           - The findings and recommendations were discussed                            with the patient's family. Procedure Code(s):        --- Professional ---  45385, Colonoscopy, flexible; with removal of                            tumor(s), polyp(s), or other lesion(s) by snare                            technique Diagnosis Code(s):        --- Professional ---                           Z12.11, Encounter for screening for malignant                            neoplasm of colon                           K63.5, Polyp of colon                           K64.0, First degree hemorrhoids CPT copyright 2019 American [employer] Association. All rights reserved. The codes documented in this report are preliminary and upon coder review may  be revised to meet current compliance requirements. Jackquline Denmark, MD 11/04/2021 9:34:55 AM This report has been signed electronically. Number of Addenda: 0

## 2021-11-04 NOTE — Discharge Instructions (Signed)
YOU HAD AN ENDOSCOPIC PROCEDURE TODAY: Refer to the procedure report and other information in the discharge instructions given to you for any specific questions about what was found during the examination. If this information does not answer your questions, please call [facility] office at [phone] to clarify.  ° °YOU SHOULD EXPECT: Some feelings of bloating in the abdomen. Passage of more gas than usual. Walking can help get rid of the air that was put into your GI tract during the procedure and reduce the bloating. If you had a lower endoscopy (such as a colonoscopy or flexible sigmoidoscopy) you may notice spotting of blood in your stool or on the toilet paper. Some abdominal soreness may be present for a day or two, also. ° °DIET: Your first meal following the procedure should be a light meal and then it is ok to progress to your normal diet. A half-sandwich or bowl of soup is an example of a good first meal. Heavy or fried foods are harder to digest and may make you feel nauseous or bloated. Drink plenty of fluids but you should avoid alcoholic beverages for 24 hours. If you had a esophageal dilation, please see attached instructions for diet.   ° °ACTIVITY: Your care partner should take you home directly after the procedure. You should plan to take it easy, moving slowly for the rest of the day. You can resume normal activity the day after the procedure however YOU SHOULD NOT DRIVE, use power tools, machinery or perform tasks that involve climbing or major physical exertion for 24 hours (because of the sedation medicines used during the test).  ° °SYMPTOMS TO REPORT IMMEDIATELY: °A gastroenterologist can be reached at any hour. Please call [phone]  for any of the following symptoms:  °Following lower endoscopy (colonoscopy, flexible sigmoidoscopy) °Excessive amounts of blood in the stool  °Significant tenderness, worsening of abdominal pains  °Swelling of the abdomen that is new, acute  °Fever of 100° or  higher  °Following upper endoscopy (EGD, EUS, ERCP, esophageal dilation) °Vomiting of blood or coffee ground material  °New, significant abdominal pain  °New, significant chest pain or pain under the shoulder blades  °Painful or persistently difficult swallowing  °New shortness of breath  °Black, tarry-looking or red, bloody stools ° °FOLLOW UP:  °If any biopsies were taken you will be contacted by phone or by letter within the next 1-3 weeks. Call [phone]  if you have not heard about the biopsies in 3 weeks.  °Please also call with any specific questions about appointments or follow up tests. ° °

## 2021-11-04 NOTE — Anesthesia Postprocedure Evaluation (Signed)
Anesthesia Post Note  Patient: [name]  Procedure(s) Performed: ESOPHAGOGASTRODUODENOSCOPY (EGD) WITH PROPOFOL COLONOSCOPY WITH PROPOFOL POLYPECTOMY     Patient location during evaluation: Endoscopy Anesthesia Type: MAC Level of consciousness: awake and alert Pain management: pain level controlled Vital Signs Assessment: post-procedure vital signs reviewed and stable Respiratory status: spontaneous breathing and respiratory function stable Cardiovascular status: stable Postop Assessment: no apparent nausea or vomiting Anesthetic complications: no   No notable events documented.  Last Vitals:  Vitals:   11/04/21 0942 11/04/21 0951  BP: 121/83 139/86  Pulse: 83 85  Resp: 12 14  Temp:    SpO2: 100% 98%    Last Pain:  Vitals:   11/04/21 0951  TempSrc:   PainSc: 0-No pain                 Jahmir Salo DANIEL

## 2021-11-04 NOTE — Op Note (Signed)
[facility] Patient Name: [name] Procedure Date: 11/04/2021 MRN: [id number] Attending MD: Jackquline Denmark , MD Date of Birth: 1971/02/02 CSN: [id number] Age: 51 Admit Type: Outpatient Procedure:                Upper GI endoscopy Indications:              Epigastric abdominal pain, Heartburn Providers:                Jackquline Denmark, MD, Mikey College, RN, Gloris Ham, Technician Referring MD:             Jackquline Denmark, MD Medicines:                Monitored Anesthesia Care Complications:            No immediate complications. Estimated Blood Loss:     Estimated blood loss: none. Procedure:                Pre-Anesthesia Assessment:                           - Prior to the procedure, a History and Physical                            was performed, and patient medications and                            allergies were reviewed. The patient's tolerance of                            previous anesthesia was also reviewed. The risks                            and benefits of the procedure and the sedation                            options and risks were discussed with the patient.                            All questions were answered, and informed consent                            was obtained. Prior Anticoagulants: The patient has                            taken no previous anticoagulant or antiplatelet                            agents. ASA Grade Assessment: II - A patient with                            mild systemic disease. After reviewing the risks  and benefits, the patient was deemed in                            satisfactory condition to undergo the procedure.                           After obtaining informed consent, the endoscope was                            passed under direct vision. Throughout the                            procedure, the patient's blood pressure, pulse, and                             oxygen saturations were monitored continuously. The                            [id number]-[id number] ([id number]) Olympus endoscope was introduced                            through the mouth, and advanced to the second part                            of duodenum. The upper GI endoscopy was                            accomplished without difficulty. The patient                            tolerated the procedure well. Scope In: Scope Out: Findings:      The Z-line was irregular and was found 38 cm from the incisors. Healed       distal esophageal erosions. biopsies were taken with a cold forceps for       histology.      Localized mild inflammation characterized by erythema was found in the       gastric antrum. Biopsies were taken with a cold forceps for histology.      The examined duodenum was normal. Biopsies for histology were taken with       a cold forceps for evaluation of celiac disease. Impression:               - Z-line irregular, 38 cm from the incisors.                            Biopsied.                           - Gastritis. Biopsied.                           - Normal examined duodenum. Biopsied. Moderate Sedation:      Not Applicable - Patient had care per Anesthesia. Recommendation:           - Patient has a contact number available for  emergencies. The signs and symptoms of potential                            delayed complications were discussed with the                            patient. Return to normal activities tomorrow.                            Written discharge instructions were provided to the                            patient.                           - Resume previous diet.                           - Continue present medications including omeprazole                            40 mg p.o. once a day.                           - Await pathology results.                           - The findings and recommendations were discussed                             with the patient's family. Procedure Code(s):        --- Professional ---                           [phone], Esophagogastroduodenoscopy, flexible,                            transoral; with biopsy, single or multiple Diagnosis Code(s):        --- Professional ---                           K22.8, Other specified diseases of esophagus                           K29.70, Gastritis, unspecified, without bleeding                           R10.13, Epigastric pain                           R12, Heartburn CPT copyright 2019 American Medical Association. All rights reserved. The codes documented in this report are preliminary and upon coder review may  be revised to meet current compliance requirements. Jackquline Denmark, MD 11/04/2021 9:30:36 AM This report has been signed electronically. Number of Addenda: 0

## 2021-11-04 NOTE — Transfer of Care (Signed)
Immediate Anesthesia Transfer of Care Note  Patient: [name]  Procedure(s) Performed: ESOPHAGOGASTRODUODENOSCOPY (EGD) WITH PROPOFOL COLONOSCOPY WITH PROPOFOL POLYPECTOMY  Patient Location: PACU and Endoscopy Unit  Anesthesia Type:MAC  Level of Consciousness: awake, alert , oriented and patient cooperative  Airway & Oxygen Therapy: Patient Spontanous Breathing and Patient connected to face mask oxygen  Post-op Assessment: Report given to RN, Post -op Vital signs reviewed and stable and Patient moving all extremities  Post vital signs: Reviewed and stable  Last Vitals:  Vitals Value Taken Time  BP    Temp    Pulse 94 11/04/21 0932  Resp 18 11/04/21 0932  SpO2 99 % 11/04/21 0932  Vitals shown include unvalidated device data.  Last Pain:  Vitals:   11/04/21 0826  TempSrc: Oral  PainSc: 0-No pain         Complications: No notable events documented.

## 2021-11-04 NOTE — H&P (Signed)
Chief Complaint: GERD, nausea, burping, change in bowel habits   HPI:    [name] is a 51 year old female, assigned to Dr. Lyndel Safe during hospitalization in 2020, with a past medical history as listed below including endometriosis, ovarian cancer and reflux, who was referred to me by Biagio Borg, MD for a complaint of GERD, nausea, burping and change in bowel habits.    07/25/2018 patient consulted by her service for positive IOC and underwent ERCP with Dr. Fuller Plan with sphincterotomy.    Today, the patient tells me that over the past 10 years she has had trouble with heartburn and reflux.  Occasionally she will have flares after eating something particularly offensive where she will get nauseous and a lot of burping.  Also describes radiating from diarrhea urgently after eating to sometimes constipation for 3 days at a time.  Tells me that dairy seems to make her go to the bathroom and have a lot of gas.  This has been going on for some time.  Currently only taking Pepcid 20 mg twice a day.  Does not recall being on any other medications for reflux.    Denies fever, chills, weight loss, blood in her stool or symptoms that awaken her from sleep.       Past Medical History:  Diagnosis Date   Anxiety     Arthritis     Depression     Dyspnea     Elevated blood pressure, situational 05/05/2016   Endometriosis     Exercise-induced asthma 09/08/2015   GERD (gastroesophageal reflux disease)     History of kidney stones     Hypothyroidism     Migraine     Ovarian cancer, bilateral ([place]) 11/28/2014   PONV (postoperative nausea and vomiting)     Sleep apnea     Thyroid disease     Umbilical hernia     woke up while porta cath removed             Past Surgical History:  Procedure Laterality Date   ABDOMINAL HYSTERECTOMY   11/10/2014    at [facility], Exp lap, supracervical hyst, BSO, infracolic omentectomy, lymphadenectomy, aortic lymph node sampling   BRONCHIAL NEEDLE ASPIRATION BIOPSY    12/16/2020    Procedure: BRONCHIAL NEEDLE ASPIRATION BIOPSIES;  Surgeon: Rigoberto Noel, MD;  Location: [place];  Service: Cardiopulmonary;;   BRONCHIAL WASHINGS   12/16/2020    Procedure: BRONCHIAL WASHINGS;  Surgeon: Rigoberto Noel, MD;  Location: [facility];  Service: Cardiopulmonary;;   CHOLECYSTECTOMY N/A 07/24/2018    Procedure: LAPAROSCOPIC CHOLECYSTECTOMY WITH INTRAOPERATIVE CHOLANGIOGRAM;  Surgeon: Armandina Gemma, MD;  Location: [facility];  Service: General;  Laterality: N/A;   ERCP N/A 07/26/2018    Procedure: ENDOSCOPIC RETROGRADE CHOLANGIOPANCREATOGRAPHY (ERCP);  Surgeon: Ladene Artist, MD;  Location: [facility];  Service: Endoscopy;  Laterality: N/A;   GANGLION CYST EXCISION Right      hand   INCISIONAL HERNIA REPAIR N/A 12/25/2020    Procedure: LAPAROSCOPIC INCISIONAL HERNIA REPAIR WITH MESH;  Surgeon: Kinsinger, Arta Bruce, MD;  Location: [facility];  Service: General;  Laterality: N/A;   IR GENERIC HISTORICAL   05/12/2016    IR REMOVAL TUN ACCESS W/ PORT W/O FL MOD SED 05/12/2016 [facility]   LIPOMA EXCISION Left      ankle   MENISCUS REPAIR Right     Porta a cath removal       PORTA CATH INSERTION       REMOVAL  OF STONES   07/26/2018    Procedure: REMOVAL OF STONES;  Surgeon: Ladene Artist, MD;  Location: [facility];  Service: Endoscopy;;   SPHINCTEROTOMY   07/26/2018    Procedure: Joan Mayans;  Surgeon: Ladene Artist, MD;  Location: [facility];  Service: Endoscopy;;   VIDEO BRONCHOSCOPY WITH ENDOBRONCHIAL ULTRASOUND N/A 12/16/2020    Procedure: VIDEO BRONCHOSCOPY WITH ENDOBRONCHIAL ULTRASOUND;  Surgeon: Rigoberto Noel, MD;  Location: [place];  Service: Cardiopulmonary;  Laterality: N/A;            Current Outpatient Medications  Medication Sig Dispense Refill   acetaminophen (TYLENOL) 650 MG CR tablet Take 1,300 mg by mouth daily as needed for pain.       ALPRAZolam (XANAX) 1 MG tablet Take 1 tablet (1 mg total) by mouth 2 (two) times daily as needed  for anxiety. 60 tablet 2   benzonatate (TESSALON) 100 MG capsule 1 - 2 TABS BY MOUTH THREE TIMES DAILY AS NEEDED FOR COUGH 60 capsule 2   Blood Pressure Monitoring (OMRON 3 SERIES BP MONITOR) DEVI Use as directed. 1 each 0   cephALEXin (KEFLEX) 500 MG capsule Take 1 capsule (500 mg total) by mouth 3 (three) times daily. 21 capsule 0   clobetasol cream (TEMOVATE) 6.44 % Apply 1 application topically 2 (two) times daily as needed. 45 g 0   cyclobenzaprine (FLEXERIL) 5 MG tablet Take 1 tablet (5 mg total) by mouth at bedtime as needed for muscle spasms. 90 tablet 3   estradiol (VIVELLE-DOT) 0.1 MG/24HR patch Place 1 patch (0.1 mg total) onto the skin 2 (two) times a week. 8 patch 12   famotidine (PEPCID) 20 MG tablet Take 1 tablet (20 mg total) by mouth 2 (two) times daily 60 tablet 11   fluticasone (FLONASE) 50 MCG/ACT nasal spray Place 2 sprays into both nostrils daily. In each nostril 16 g 1   fluticasone-salmeterol (ADVAIR DISKUS) 250-50 MCG/ACT AEPB Inhale 1 puff into the lungs in the morning and at bedtime. 034 each 3   folic acid (FOLVITE) 1 MG tablet Take 2 tablets (2 mg total) by mouth daily. 180 tablet 3   gabapentin (NEURONTIN) 600 MG tablet Take 1 tablet (600 mg total) by mouth 3 (three) times daily. 270 tablet 3   hydroxypropyl methylcellulose / hypromellose (ISOPTO TEARS / GONIOVISC) 2.5 % ophthalmic solution Place 1 drop into both eyes daily as needed for dry eyes.       levothyroxine (SYNTHROID) 75 MCG tablet TAKE 1 TABLET BY MOUTH EVERY DAY (Patient taking differently: Take 75 mcg by mouth daily before breakfast.) 30 tablet 0   levothyroxine (SYNTHROID) 88 MCG tablet Take 1 tablet (88 mcg total) by mouth daily. 90 tablet 3   losartan (COZAAR) 25 MG tablet Take 1 tablet (25 mg total) by mouth daily. Please schedule an appointment. 90 tablet 1   methotrexate (RHEUMATREX) 2.5 MG tablet Take 4 tablets (10 mg total) by mouth once a week. Caution: Chemotherapy. Protect from light. (Patient  not taking: Reported on 03/30/2021) 32 tablet 0   methotrexate 50 MG/2ML injection Inject 0.8 mLs (20 mg total) into the skin once a week. 10 mL 0   omeprazole (PRILOSEC) 20 MG capsule Take 20 mg by mouth daily.       ondansetron (ZOFRAN) 4 MG tablet Take 1 tablet (4 mg total) by mouth every 6 (six) hours as needed for nausea or vomiting. 30 tablet 1   sertraline (ZOLOFT) 100 MG tablet TAKE 2 TABLETS (200  MG TOTAL) BY MOUTH DAILY. 180 tablet 3   sodium chloride (OCEAN) 0.65 % SOLN nasal spray Place 1 spray into both nostrils daily as needed for congestion.       traZODone (DESYREL) 50 MG tablet Take 0.5-1 tablets (25-50 mg total) by mouth at bedtime as needed for sleep. 90 tablet 0   TUBERCULIN SYR 1CC/27GX1/2" (B-D TB SYRINGE 1CC/27GX1/2") 27G X 1/2" 1 ML MISC Use once a week. 12 each 3   Zoster Vaccine Adjuvanted [facility]) injection Inject into the muscle. 0.5 mL 1    No current facility-administered medications for this visit.           Allergies as of 08/26/2021 - Review Complete 08/03/2021  Allergen Reaction Noted   Moxifloxacin Anaphylaxis     Quinolones Anaphylaxis, Shortness Of Breath, and Swelling 11/21/2014   Doxycycline Nausea And Vomiting 12/27/2007   Escitalopram oxalate Other (See Comments) 02/24/2010   Lisinopril Cough 10/13/2020   Sulfonamide derivatives Swelling and Rash 04/13/2007           Family History  Problem Relation Age of Onset   Hypertension Mother     Diabetes Mother     Stroke Mother          Deceased   Heart disease Mother     Osteoarthritis Mother     Atrial fibrillation Father          Living   Stroke Father          optic nerve left eye   Healthy Brother     Diabetes Maternal Aunt     Heart failure Maternal Grandmother     Heart attack Maternal Grandfather     Dementia Paternal Grandmother     Dementia Paternal Grandfather        Social History         Socioeconomic History   Marital status: Married      Spouse name: Not on file    Number of children: 0   Years of education: Not on file   Highest education level: Not on file  Occupational History   Not on file  Tobacco Use   Smoking status: Never   Smokeless tobacco: Never  Vaping Use   Vaping Use: Never used  Substance and Sexual Activity   Alcohol use: Not Currently   Drug use: No   Sexual activity: Not on file  Other Topics Concern   Not on file  Social History Narrative    Lives with boyfriend in a one story home.  Has no children.      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      Education: college.    Right handed     Social Determinants of Health    Financial Resource Strain: Not on file  Food Insecurity: Not on file  Transportation Needs: Not on file  Physical Activity: Not on file  Stress: Not on file  Social Connections: Not on file  Intimate Partner Violence: Not on file      Review of Systems:    Constitutional: No weight loss, fever or chills Skin: No rash  Cardiovascular: No chest pain  Respiratory: No SOB  Gastrointestinal: See HPI and otherwise negative Genitourinary: No dysuria Neurological: No headache, dizziness or syncope Musculoskeletal: No new muscle or joint pain Hematologic: No bleeding Psychiatric: No history of depression or anxiety    Physical Exam:  Vital signs: BP 110/70 (BP Location: Left Wrist, Patient Position: Sitting, Cuff Size: Normal)   Pulse 84  Ht 5' 6.5" (1.689 m) Comment: measured without shoes  Wt (!) 330 lb (149.7 kg)   LMP 11/02/2014   BMI 52.47 kg/m     Constitutional:   Pleasant morbidly obese Caucasian female appears to be in NAD, Well developed, Well nourished, alert and cooperative Head:  Normocephalic and atraumatic. Eyes:   PEERL, EOMI. No icterus. Conjunctiva pink. Ears:  Normal auditory acuity. Neck:  Supple Throat: Oral cavity and pharynx without inflammation, swelling or lesion.  Respiratory: Respirations even and unlabored. Lungs clear to auscultation bilaterally.   No wheezes, crackles, or  rhonchi.  Cardiovascular: Normal S1, S2. No MRG. Regular rate and rhythm. No peripheral edema, cyanosis or pallor.  Gastrointestinal:  Soft, nondistended, mild epigastric TTP, no rebound or guarding. Normal bowel sounds. No appreciable masses or hepatomegaly. Rectal:  Not performed.  Msk:  Symmetrical without gross deformities. Without edema, no deformity or joint abnormality.  Neurologic:  Alert and  oriented x4;  grossly normal neurologically.  Skin:   Dry and intact without significant lesions or rashes. Psychiatric: Demonstrates good judgement and reason without abnormal affect or behaviors.   RELEVANT LABS AND IMAGING: CBC Labs (Brief)          Component Value Date/Time    WBC 11.6 (H) 06/01/2021 1535    RBC 4.72 06/01/2021 1535    HGB 14.5 06/01/2021 1535    HGB 13.7 04/21/2016 1439    HCT 42.8 06/01/2021 1535    HCT 40.1 04/21/2016 1439    PLT 344 06/01/2021 1535    PLT 277 04/21/2016 1439    MCV 90.7 06/01/2021 1535    MCV 87.7 04/21/2016 1439    MCH 30.7 06/01/2021 1535    MCHC 33.9 06/01/2021 1535    RDW 12.9 06/01/2021 1535    RDW 13.1 04/21/2016 1439    LYMPHSABS 3,086 06/01/2021 1535    LYMPHSABS 2.8 04/21/2016 1439    MONOABS 0.5 03/27/2021 1005    MONOABS 0.6 04/21/2016 1439    EOSABS 197 06/01/2021 1535    EOSABS 0.2 04/21/2016 1439    BASOSABS 23 06/01/2021 1535    BASOSABS 0.0 04/21/2016 1439        CMP     Labs (Brief)          Component Value Date/Time    NA 138 06/01/2021 1535    NA 139 04/21/2016 1439    K 3.9 06/01/2021 1535    K 3.4 (L) 04/21/2016 1439    CL 101 06/01/2021 1535    CO2 34 (H) 06/01/2021 1535    CO2 25 04/21/2016 1439    GLUCOSE 89 06/01/2021 1535    GLUCOSE 115 04/21/2016 1439    BUN 10 06/01/2021 1535    BUN 10.6 04/21/2016 1439    CREATININE 0.79 06/01/2021 1535    CREATININE 0.8 04/21/2016 1439    CALCIUM 9.6 06/01/2021 1535    CALCIUM 9.6 04/21/2016 1439    PROT 7.8 06/01/2021 1535    PROT 7.6 04/21/2016 1439     ALBUMIN 4.3 06/15/2020 1215    ALBUMIN 3.7 04/21/2016 1439    AST 18 06/01/2021 1535    AST 19 04/21/2016 1439    ALT 21 06/01/2021 1535    ALT 25 04/21/2016 1439    ALKPHOS 56 06/15/2020 1215    ALKPHOS 88 04/21/2016 1439    BILITOT 0.8 06/01/2021 1535    BILITOT 0.73 04/21/2016 1439    GFRNONAA >60 03/27/2021 1005    GFRNONAA 95 11/11/2020 0000  GFRAA 110 11/11/2020 0000        Assessment: 1.  Change in bowel habits: Radiates back-and-forth from days of constipation to urgent diarrhea, diarrhea seems to be related to certain foods; likely IBS 2.  GERD: Chronic for the patient, experiences daily symptoms of epigastric discomfort and gas, no help from Pepcid 20 twice daily; likely gastritis +/- PUD +/- H. pylori 3.  Gas: Typically with dairy products; likely lactose intolerance +/- IBS/gastritis 4.  Screening for colorectal cancer: Patient is now 51 years old and has never had screening for colon cancer.   Plan: 1.  Patient needs diagnostic EGD and screening colonoscopy at the hospital given BMI greater than 50.  This will be scheduled with Dr. Lyndel Safe.  Did provide the patient a detailed list of risks for the procedures and she agrees to proceed. 2.  Started the patient on Omeprazole 40 mg every morning, 30 minutes for breakfast #30 with 5 refills.  She can trial discontinuing the Famotidine for now and see how just the Omeprazole works. 3.  Recommend a fiber supplement given radiation in bowel habits.  Discussed once daily supplementation.  Also discussed use of MiraLAX when she feels like she is becoming more constipated which is often at times around Methotrexate dosing. 4.  Patient to follow in clinic per recommendations after procedures above.  She will likely require follow-up given what sounds like IBS symptoms for further discussion.   Ellouise Newer, PA-C    Attending physician's note   I have taken history, reviewed the chart and examined the patient  For EGD/colon at  [facility] d/t BMI>50    Carmell Austria, MD [facility] [phone]

## 2021-11-05 ENCOUNTER — Encounter (HOSPITAL_COMMUNITY): Payer: Self-pay | Admitting: Gastroenterology

## 2021-11-05 ENCOUNTER — Encounter: Payer: Self-pay | Admitting: Gastroenterology

## 2021-11-05 ENCOUNTER — Ambulatory Visit: Payer: 59 | Admitting: Physician Assistant

## 2021-11-05 LAB — SURGICAL PATHOLOGY

## 2021-11-05 NOTE — Addendum Note (Signed)
Addendum  created 11/05/21 9379 by Duane Boston, MD   [place] recorded in [place], [place] filed

## 2021-11-08 ENCOUNTER — Other Ambulatory Visit (HOSPITAL_BASED_OUTPATIENT_CLINIC_OR_DEPARTMENT_OTHER): Payer: Self-pay

## 2021-11-10 DIAGNOSIS — Z0289 Encounter for other administrative examinations: Secondary | ICD-10-CM

## 2021-11-16 ENCOUNTER — Ambulatory Visit (INDEPENDENT_AMBULATORY_CARE_PROVIDER_SITE_OTHER): Payer: 59 | Admitting: Family Medicine

## 2021-11-16 ENCOUNTER — Encounter (INDEPENDENT_AMBULATORY_CARE_PROVIDER_SITE_OTHER): Payer: Self-pay | Admitting: Family Medicine

## 2021-11-16 VITALS — BP 104/71 | HR 82 | Temp 98.3°F | Ht 67.0 in | Wt 333.0 lb

## 2021-11-16 DIAGNOSIS — R5383 Other fatigue: Secondary | ICD-10-CM | POA: Diagnosis not present

## 2021-11-16 DIAGNOSIS — Z6841 Body Mass Index (BMI) 40.0 and over, adult: Secondary | ICD-10-CM

## 2021-11-16 DIAGNOSIS — E559 Vitamin D deficiency, unspecified: Secondary | ICD-10-CM | POA: Diagnosis not present

## 2021-11-16 DIAGNOSIS — G4733 Obstructive sleep apnea (adult) (pediatric): Secondary | ICD-10-CM

## 2021-11-16 DIAGNOSIS — R7303 Prediabetes: Secondary | ICD-10-CM | POA: Diagnosis not present

## 2021-11-16 DIAGNOSIS — Z9989 Dependence on other enabling machines and devices: Secondary | ICD-10-CM

## 2021-11-16 DIAGNOSIS — R7309 Other abnormal glucose: Secondary | ICD-10-CM | POA: Diagnosis not present

## 2021-11-16 DIAGNOSIS — F5089 Other specified eating disorder: Secondary | ICD-10-CM

## 2021-11-16 DIAGNOSIS — E669 Obesity, unspecified: Secondary | ICD-10-CM | POA: Diagnosis not present

## 2021-11-16 DIAGNOSIS — R0602 Shortness of breath: Secondary | ICD-10-CM | POA: Diagnosis not present

## 2021-11-16 DIAGNOSIS — E038 Other specified hypothyroidism: Secondary | ICD-10-CM | POA: Diagnosis not present

## 2021-11-16 DIAGNOSIS — F3289 Other specified depressive episodes: Secondary | ICD-10-CM

## 2021-11-17 LAB — CBC WITH DIFFERENTIAL/PLATELET
Basophils Absolute: 0 10*3/uL (ref 0.0–0.2)
Basos: 0 %
EOS (ABSOLUTE): 0.2 10*3/uL (ref 0.0–0.4)
Eos: 2 %
Hematocrit: 40.6 % (ref 34.0–46.6)
Hemoglobin: 13.5 g/dL (ref 11.1–15.9)
Immature Grans (Abs): 0.1 10*3/uL (ref 0.0–0.1)
Immature Granulocytes: 1 %
Lymphocytes Absolute: 2.4 10*3/uL (ref 0.7–3.1)
Lymphs: 26 %
MCH: 29.8 pg (ref 26.6–33.0)
MCHC: 33.3 g/dL (ref 31.5–35.7)
MCV: 90 fL (ref 79–97)
Monocytes Absolute: 0.4 10*3/uL (ref 0.1–0.9)
Monocytes: 4 %
Neutrophils Absolute: 6.3 10*3/uL (ref 1.4–7.0)
Neutrophils: 67 %
Platelets: 280 10*3/uL (ref 150–450)
RBC: 4.53 x10E6/uL (ref 3.77–5.28)
RDW: 12.8 % (ref 11.7–15.4)
WBC: 9.4 10*3/uL (ref 3.4–10.8)

## 2021-11-17 LAB — COMPREHENSIVE METABOLIC PANEL
ALT: 22 IU/L (ref 0–32)
AST: 15 IU/L (ref 0–40)
Albumin/Globulin Ratio: 1.6 (ref 1.2–2.2)
Albumin: 4.5 g/dL (ref 3.9–4.9)
Alkaline Phosphatase: 68 IU/L (ref 44–121)
BUN/Creatinine Ratio: 11 (ref 9–23)
BUN: 9 mg/dL (ref 6–24)
Bilirubin Total: 1.2 mg/dL (ref 0.0–1.2)
CO2: 25 mmol/L (ref 20–29)
Calcium: 9.6 mg/dL (ref 8.7–10.2)
Chloride: 101 mmol/L (ref 96–106)
Creatinine, Ser: 0.8 mg/dL (ref 0.57–1.00)
Globulin, Total: 2.9 g/dL (ref 1.5–4.5)
Glucose: 101 mg/dL — ABNORMAL HIGH (ref 70–99)
Potassium: 4.3 mmol/L (ref 3.5–5.2)
Sodium: 141 mmol/L (ref 134–144)
Total Protein: 7.4 g/dL (ref 6.0–8.5)
eGFR: 90 mL/min/{1.73_m2} (ref >59)

## 2021-11-17 LAB — TSH: TSH: 5.36 u[IU]/mL — ABNORMAL HIGH (ref 0.450–4.500)

## 2021-11-17 LAB — VITAMIN B12: Vitamin B-12: 458 pg/mL (ref 232–1245)

## 2021-11-17 LAB — INSULIN, RANDOM: INSULIN: 17.5 u[IU]/mL (ref 2.6–24.9)

## 2021-11-17 LAB — T4, FREE: Free T4: 0.97 ng/dL (ref 0.82–1.77)

## 2021-11-17 LAB — HEMOGLOBIN A1C
Est. average glucose Bld gHb Est-mCnc: 120 mg/dL
Hgb A1c MFr Bld: 5.8 % — ABNORMAL HIGH (ref 4.8–5.6)

## 2021-11-17 LAB — VITAMIN D 25 HYDROXY (VIT D DEFICIENCY, FRACTURES): Vit D, 25-Hydroxy: 21.3 ng/mL — ABNORMAL LOW (ref 30.0–100.0)

## 2021-11-17 NOTE — Progress Notes (Signed)
Chief Complaint:   OBESITY [name] (MR# [id number]) is a 51 y.o. female who presents for evaluation and treatment of obesity and related comorbidities. Current BMI is Body mass index is 52.16 kg/m. [name] has been struggling with her weight for many years and has been unsuccessful in either losing weight, maintaining weight loss, or reaching her healthy weight goal.  [name] gained weight after chemo for ovarian cancer and 2016.  Exercise limited with R>L knee pain with DJD.  She needs to get BMI below 40 for total knee replacement.  Craves sweets and starchy foods.  Under stress at work.  Long history of emotional eating.  Husband cooks dinner.  Brings healthy food to work.  Has never used anti-obesity medications.  [name] is currently in the action stage of change and ready to dedicate time achieving and maintaining a healthier weight. [name] is interested in becoming our patient and working on intensive lifestyle modifications including (but not limited to) diet and exercise for weight loss.  [name]'s habits were reviewed today and are as follows: Her family eats meals together, she thinks her family will eat healthier with her, she struggles with family and or coworkers weight loss sabotage, her desired weight loss is 133 lbs, she has been heavy most of her life, she started gaining weight after cancer and divorce, her heaviest weight ever was 338 pounds, she is a picky eater and doesn't like to eat healthier foods, she has significant food cravings issues, she is frequently drinking liquids with calories, she frequently makes poor food choices, she has problems with excessive hunger, she frequently eats larger portions than normal, and she struggles with emotional eating.  Depression Screen [name]'s Food and Mood (modified PHQ-9) score was 21.     11/16/2021    9:13 AM  Depression screen PHQ 2/9  Decreased Interest 3  Down, Depressed, Hopeless 3  PHQ - 2 Score 6  Altered  sleeping 3  Tired, decreased energy 3  Change in appetite 2  Feeling bad or failure about yourself  2  Trouble concentrating 1  Moving slowly or fidgety/restless 3  Suicidal thoughts 1  PHQ-9 Score 21  Difficult doing work/chores Somewhat difficult   Subjective:   1. Other fatigue [name] admits to daytime somnolence and admits to waking up still tired. Patient has a history of symptoms of daytime fatigue, morning fatigue, and morning headache. [name] generally gets 6 or 7 hours of sleep per night, and states that she has nightime awakenings. Snoring is present. Apneic episodes are present. Epworth Sleepiness Score is 5.  EKG-normal sinus rhythm.  2. SOBOE (shortness of breath on exertion) [name] notes increasing shortness of breath with exercising and seems to be worsening over time with weight gain. She notes getting out of breath sooner with activity than she used to. This has not gotten worse recently. [name] denies shortness of breath at rest or orthopnea.  3. Prediabetes [name]'s last A1c was 5.8 on 06/25/2021.  4. Vitamin D deficiency [name]'s last vitamin D level was 13.5 on 06/25/2021.  She is on vitamin D 2000 units/day.  5. OSA on CPAP [name] is using CPAP nightly.  6. Other specified hypothyroidism [name] is on levothyroxine 88 mcg daily.  7. Other depression, with emotional eating [name]'s PHQ-9 score is 21.  She is on Zoloft 200 mg daily per her PCP.  Stress increased at work, has support at home.  Assessment/Plan:   1. Other fatigue [name] does feel that her weight  is causing her energy to be lower than it should be. Fatigue may be related to obesity, depression or many other causes. Labs will be ordered, and in the meanwhile, [place] will focus on self care including making healthy food choices, increasing physical activity and focusing on stress reduction.  - EKG 12-Lead - Vitamin B12 - CBC with Differential/Platelet - Comprehensive metabolic panel - T4, free -  TSH  2. SOBOE (shortness of breath on exertion) [name] does feel that she gets out of breath more easily that she used to when she exercises. [name]'s shortness of breath appears to be obesity related and exercise induced. She has agreed to work on weight loss and gradually increase exercise to treat her exercise induced shortness of breath. Will continue to monitor closely.  3. Prediabetes We will check labs today.  [name] will begin to decrease her intake of added sugar and work on weight loss.  - Hemoglobin A1c - Insulin, random  4. Vitamin D deficiency We will check labs today, and we will follow-up at [place] next visit.  - VITAMIN D 25 Hydroxy (Vit-D Deficiency, Fractures)  5. OSA on CPAP [name] will continue her CPAP nightly, and she will work on her weight loss.  6. Other specified hypothyroidism We will check labs today, and we will follow-up at [place] next visit.  - T4, free - TSH  7. Other depression, with emotional eating [name] will consider adding Wellbutrin SR at next visit, and will consider cognitive behavioral therapy with Dr. Mallie Mussel, our bariatric psychologist.  8. Obesity, current BMI 52.16 [name] is currently in the action stage of change and her goal is to continue with weight loss efforts. I recommend [name] begin the structured treatment plan as follows:  She has agreed to the Category 3 Plan or keeping a food journal and adhering to recommended goals of 1500 calories and 80-90 grams of protein daily.  Began category 3 eating plan.  Update labs today.  Reviewed IC results.  EKG done, reviewed-normal sinus rhythm.  Exercise goals: As is, in a boot.    Behavioral modification strategies: increasing lean protein intake, increasing water intake, decreasing liquid calories, and emotional eating strategies.  She was informed of the importance of frequent follow-up visits to maximize her success with intensive lifestyle modifications for her multiple health  conditions. She was informed we would discuss her lab results at her next visit unless there is a critical issue that needs to be addressed sooner. [name] agreed to keep her next visit at the agreed upon time to discuss these results.  Objective:   Blood pressure 104/71, pulse 82, temperature 98.3 F (36.8 C), height '5\' 7"'$  (1.702 m), weight (!) 333 lb (151 kg), last menstrual period 11/02/2014, SpO2 97 %. Body mass index is 52.16 kg/m.  EKG: Normal sinus rhythm, rate 87 BPM.  Indirect Calorimeter completed today shows a VO2 of 299 and a REE of 2059.  Her calculated basal metabolic rate is unable to get BMR from bioimpedance scale, patient is in a CAM walker. thus her basal metabolic rate is  (unknown)  than expected.  General: Cooperative, alert, well developed, in no acute distress. HEENT: Conjunctivae and lids unremarkable. Cardiovascular: Regular rhythm.  Lungs: Normal work of breathing. Neurologic: No focal deficits.   Lab Results  Component Value Date   CREATININE 0.80 11/16/2021   BUN 9 11/16/2021   NA 141 11/16/2021   K 4.3 11/16/2021   CL 101 11/16/2021   CO2 25 11/16/2021   Lab  Results  Component Value Date   ALT 22 11/16/2021   AST 15 11/16/2021   ALKPHOS 68 11/16/2021   BILITOT 1.2 11/16/2021   Lab Results  Component Value Date   HGBA1C 5.8 (H) 11/16/2021   HGBA1C 5.8 06/25/2021   HGBA1C 5.7 10/16/2017   Lab Results  Component Value Date   INSULIN WILL FOLLOW 11/16/2021   Lab Results  Component Value Date   TSH 5.360 (H) 11/16/2021   Lab Results  Component Value Date   CHOL 159 06/25/2021   HDL 38.90 (L) 06/25/2021   LDLCALC 92 06/25/2021   TRIG 140.0 06/25/2021   CHOLHDL 4 06/25/2021   Lab Results  Component Value Date   WBC 9.4 11/16/2021   HGB 13.5 11/16/2021   HCT 40.6 11/16/2021   MCV 90 11/16/2021   PLT 280 11/16/2021   No results found for: "IRON", "TIBC", "FERRITIN"  Attestation Statements:   Reviewed by clinician on day of visit:  allergies, medications, problem list, medical history, surgical history, family history, social history, and previous encounter notes.   Wilhemena Durie, am acting as transcriptionist for Loyal Gambler, DO.  I have reviewed the above documentation for accuracy and completeness, and I agree with the above. -Dell Ponto, DO

## 2021-11-18 ENCOUNTER — Other Ambulatory Visit: Payer: Self-pay | Admitting: Neurology

## 2021-11-18 ENCOUNTER — Other Ambulatory Visit (HOSPITAL_BASED_OUTPATIENT_CLINIC_OR_DEPARTMENT_OTHER): Payer: Self-pay

## 2021-11-19 ENCOUNTER — Encounter: Payer: Self-pay | Admitting: Podiatry

## 2021-11-19 ENCOUNTER — Ambulatory Visit (INDEPENDENT_AMBULATORY_CARE_PROVIDER_SITE_OTHER): Payer: 59

## 2021-11-19 ENCOUNTER — Ambulatory Visit (INDEPENDENT_AMBULATORY_CARE_PROVIDER_SITE_OTHER): Payer: 59 | Admitting: Podiatry

## 2021-11-19 ENCOUNTER — Other Ambulatory Visit (HOSPITAL_BASED_OUTPATIENT_CLINIC_OR_DEPARTMENT_OTHER): Payer: Self-pay

## 2021-11-19 DIAGNOSIS — Z9889 Other specified postprocedural states: Secondary | ICD-10-CM

## 2021-11-19 DIAGNOSIS — M21612 Bunion of left foot: Secondary | ICD-10-CM

## 2021-11-19 MED ORDER — GABAPENTIN 600 MG PO TABS
600.0000 mg | ORAL_TABLET | Freq: Three times a day (TID) | ORAL | 1 refills | Status: DC
  Filled 2021-11-19: qty 270, 90d supply, fill #0

## 2021-11-22 NOTE — Progress Notes (Signed)
Subjective: [name] is a 51 y.o. is seen today in office s/p left foot Lapidus bunionectomy preformed on 09/08/2021.  She is not able to start physical therapy until August 1.  She has had some discomfort with walking and stiffness and swelling but overall seems to be improving.  She still elevating.  No recent injury or changes.  No fevers or chills.     Objective: General: No acute distress, AAOx3  DP/PT pulses palpable 2/4, CRT < 3 sec to all digits.  Protective sensation intact. Motor function intact.  Left foot: Incision is well coapted without any evidence of dehiscence.  Scar is well formed.  There is mild edema there is no erythema or warmth.  There is some restriction first MPJ range of motion.  Mild discomfort on the proximal portion of the incision.  There is no other areas of pinpoint tenderness.  MMT 5/5. The bruising that was present previously the plantar aspect the foot has resolved. No pain with calf compression, swelling, warmth, erythema.   Assessment and Plan:  Status post left foot Lapidus bunionectomy  -Treatment options discussed including all alternatives, risks, and complications -X-rays obtained reviewed.  Hardware intact any complicating factors.  Increased consolidation noted across the arthrodesis site.  No evidence of acute fracture. -At this point continue with regular shoe gear.  Hopefully can start physical therapy soon.  Discussed with her ankle, MPJ range of motion exercises.  Continue supportive shoe gear.  Return in about 4 weeks (around 12/17/2021).  Trula Slade DPM

## 2021-11-26 ENCOUNTER — Other Ambulatory Visit (HOSPITAL_BASED_OUTPATIENT_CLINIC_OR_DEPARTMENT_OTHER): Payer: Self-pay

## 2021-11-26 MED ORDER — ZOSTER VAC RECOMB ADJUVANTED 50 MCG/0.5ML IM SUSR
INTRAMUSCULAR | 0 refills | Status: DC
  Filled 2021-11-26: qty 0.5, 1d supply, fill #0

## 2021-11-30 ENCOUNTER — Encounter (INDEPENDENT_AMBULATORY_CARE_PROVIDER_SITE_OTHER): Payer: Self-pay

## 2021-11-30 ENCOUNTER — Telehealth (INDEPENDENT_AMBULATORY_CARE_PROVIDER_SITE_OTHER): Payer: Self-pay | Admitting: Family Medicine

## 2021-11-30 ENCOUNTER — Encounter: Payer: Self-pay | Admitting: Oncology

## 2021-11-30 ENCOUNTER — Telehealth (INDEPENDENT_AMBULATORY_CARE_PROVIDER_SITE_OTHER): Payer: Self-pay

## 2021-11-30 ENCOUNTER — Ambulatory Visit (INDEPENDENT_AMBULATORY_CARE_PROVIDER_SITE_OTHER): Payer: 59 | Admitting: Family Medicine

## 2021-11-30 ENCOUNTER — Encounter (HOSPITAL_BASED_OUTPATIENT_CLINIC_OR_DEPARTMENT_OTHER): Payer: Self-pay | Admitting: Pharmacist

## 2021-11-30 ENCOUNTER — Other Ambulatory Visit (HOSPITAL_BASED_OUTPATIENT_CLINIC_OR_DEPARTMENT_OTHER): Payer: Self-pay

## 2021-11-30 ENCOUNTER — Encounter (INDEPENDENT_AMBULATORY_CARE_PROVIDER_SITE_OTHER): Payer: Self-pay | Admitting: Family Medicine

## 2021-11-30 VITALS — BP 106/75 | HR 80 | Temp 98.5°F | Ht 67.0 in | Wt 319.0 lb

## 2021-11-30 DIAGNOSIS — E038 Other specified hypothyroidism: Secondary | ICD-10-CM | POA: Diagnosis not present

## 2021-11-30 DIAGNOSIS — E669 Obesity, unspecified: Secondary | ICD-10-CM

## 2021-11-30 DIAGNOSIS — Z9989 Dependence on other enabling machines and devices: Secondary | ICD-10-CM | POA: Diagnosis not present

## 2021-11-30 DIAGNOSIS — E559 Vitamin D deficiency, unspecified: Secondary | ICD-10-CM | POA: Diagnosis not present

## 2021-11-30 DIAGNOSIS — Z6841 Body Mass Index (BMI) 40.0 and over, adult: Secondary | ICD-10-CM | POA: Diagnosis not present

## 2021-11-30 DIAGNOSIS — R7303 Prediabetes: Secondary | ICD-10-CM

## 2021-11-30 DIAGNOSIS — F3289 Other specified depressive episodes: Secondary | ICD-10-CM | POA: Diagnosis not present

## 2021-11-30 DIAGNOSIS — G4733 Obstructive sleep apnea (adult) (pediatric): Secondary | ICD-10-CM | POA: Diagnosis not present

## 2021-11-30 DIAGNOSIS — M170 Bilateral primary osteoarthritis of knee: Secondary | ICD-10-CM | POA: Diagnosis not present

## 2021-11-30 DIAGNOSIS — E66813 Obesity, class 3: Secondary | ICD-10-CM

## 2021-11-30 MED ORDER — VITAMIN D (ERGOCALCIFEROL) 1.25 MG (50000 UNIT) PO CAPS
50000.0000 [IU] | ORAL_CAPSULE | ORAL | 0 refills | Status: DC
  Filled 2021-11-30: qty 5, 35d supply, fill #0

## 2021-11-30 MED ORDER — SAXENDA 18 MG/3ML ~~LOC~~ SOPN
0.6000 mg | PEN_INJECTOR | Freq: Every day | SUBCUTANEOUS | 0 refills | Status: DC
  Filled 2021-11-30: qty 15, 30d supply, fill #0

## 2021-11-30 MED ORDER — BD PEN NEEDLE MINI U/F 31G X 5 MM MISC
0 refills | Status: DC
  Filled 2021-11-30 (×2): qty 100, 90d supply, fill #0

## 2021-11-30 NOTE — Telephone Encounter (Signed)
Dr. Valetta Close - Prior authorization approved for Saxenda. Effective: 11/30/2021 to 03/22/2022. Patient sent approval message via mychart.

## 2021-11-30 NOTE — Telephone Encounter (Signed)
Prior authorization was already started for Saxenda. Received request from pharmacy earlier today. Will notify patient and provider once response is received. Thanks!

## 2021-11-30 NOTE — Telephone Encounter (Signed)
Pt was seen in the office today. Pt was prescribed Saxenda. Please start process for Prior Auth.   Thank You!

## 2021-12-02 ENCOUNTER — Encounter: Payer: Self-pay | Admitting: Internal Medicine

## 2021-12-03 ENCOUNTER — Other Ambulatory Visit (HOSPITAL_BASED_OUTPATIENT_CLINIC_OR_DEPARTMENT_OTHER): Payer: Self-pay

## 2021-12-07 ENCOUNTER — Ambulatory Visit (HOSPITAL_BASED_OUTPATIENT_CLINIC_OR_DEPARTMENT_OTHER): Payer: 59 | Attending: Podiatry | Admitting: Physical Therapy

## 2021-12-07 ENCOUNTER — Encounter (HOSPITAL_BASED_OUTPATIENT_CLINIC_OR_DEPARTMENT_OTHER): Payer: Self-pay | Admitting: Physical Therapy

## 2021-12-07 DIAGNOSIS — M25672 Stiffness of left ankle, not elsewhere classified: Secondary | ICD-10-CM

## 2021-12-07 DIAGNOSIS — R2689 Other abnormalities of gait and mobility: Secondary | ICD-10-CM

## 2021-12-07 DIAGNOSIS — M25372 Other instability, left ankle: Secondary | ICD-10-CM | POA: Insufficient documentation

## 2021-12-07 DIAGNOSIS — M25572 Pain in left ankle and joints of left foot: Secondary | ICD-10-CM

## 2021-12-07 DIAGNOSIS — R262 Difficulty in walking, not elsewhere classified: Secondary | ICD-10-CM | POA: Insufficient documentation

## 2021-12-07 DIAGNOSIS — M545 Low back pain, unspecified: Secondary | ICD-10-CM | POA: Insufficient documentation

## 2021-12-07 DIAGNOSIS — M21612 Bunion of left foot: Secondary | ICD-10-CM | POA: Diagnosis not present

## 2021-12-07 DIAGNOSIS — M25571 Pain in right ankle and joints of right foot: Secondary | ICD-10-CM | POA: Insufficient documentation

## 2021-12-07 DIAGNOSIS — R269 Unspecified abnormalities of gait and mobility: Secondary | ICD-10-CM | POA: Insufficient documentation

## 2021-12-07 NOTE — Progress Notes (Signed)
Chief Complaint:   OBESITY [name] is here to discuss her progress with her obesity treatment plan along with follow-up of her obesity related diagnoses. Trana is on the Category 3 Plan and keeping a food journal and adhering to recommended goals of 1500 calories and 80-90 grams of protein daily and states she is following her eating plan approximately 95% of the time. Hannah states she is doing water aerobics.    Today's visit was #: 2 Starting weight: 333 lbs Starting date: 11/16/2021 Today's weight: 319 lbs Today's date: 11/30/2021 Total lbs lost to date: 14 Total lbs lost since last in-office visit: 14  Interim History: [name] is cooking more at home with good support.  She is eating more fruit and vegetables.  Started water exercise with a friend and will be doing physical therapy soon.  Walking is limited by left> right knee pain.  She cut out excess sugar.  Craves sweets and comfort foods.  She feels hungry.  Has never used AOM.  Subjective:   1. Vitamin D deficiency [name]'s vitamin D level was 21.3 on her recent labs.  She is taking OTC vitamin D 2000 units daily.  She complains of fatigue.  2. OSA on CPAP [name] is using her CPAP nightly.  3. Other depression [name] is on Zoloft 100 mg daily.  Her PHQ-9 score was 21 at her last visit.  4. Prediabetes [name]'s A1c was 5.8 on her labs on 11/16/2021, and insulin level was 17.5.  5. Osteoarthritis of both knees, unspecified osteoarthritis type [name] needs her BMI below 40 for total knee replacement.  She notes limited walking.  6. Other specified hypothyroidism [name] is on Synthroid 88 mcg once daily.  Her TSH was elevated on her labs on 11/16/2021.  Assessment/Plan:   1. Vitamin D deficiency [name] agreed to discontinue OTC vitamin D, and begin prescription vitamin D 50,000 units once weekly with no refills.  - Vitamin D, Ergocalciferol, (DRISDOL) 1.25 MG (50000 UNIT) CAPS capsule; Take 1 capsule (50,000 Units  total) by mouth every 7 (seven) days.  Dispense: 5 capsule; Refill: 0  2. OSA on CPAP [name] will continue her CPAP nightly, and she will continue to work on weight loss.  3. Other depression [name] will consider adding Wellbutrin SR or cognitive behavioral therapy in the future.  She will do better with mindfulness.  4. Prediabetes [name] will continue to work on decreasing intake of sugar and refined carbohydrates.  She will increase her exercise time and continue with weight loss.  5. Osteoarthritis of both knees, unspecified osteoarthritis type [name] will continue with water exercise, bike, and weight training with goal of 3 times per week.  She will follow-up with Ortho once her BMI is close to 40.  6. Other specified hypothyroidism [name]'s TSH was CCed to Dr. [name] to adjust thyroid dose.  7. Obesity current BMI-50.1 [name] is currently in the action stage of change. As such, her goal is to continue with weight loss efforts. She has agreed to the Category 3 Plan.   We discussed various medication options to help [name] with her weight loss efforts and we both agreed to begin Saxenda 0.6 mg [place] daily injection.  We reviewed MOA and potential adverse SEs of Saxenda.  She is not at risk for pregnancy and denies a personal or family hx of pancreatitis, MEN II or medullary thyroid carcinoma.  She understands Kirke Shaggy must be used along with a reduced calorie diet and a plan for regular exercise.    -  Liraglutide -Weight Management (SAXENDA) 18 MG/3ML SOPN; Inject 0.6 mg into the skin daily.  Dispense: 15 mL; Refill: 0 - Insulin Pen Needle (B-D UF III MINI PEN NEEDLES) 31G X 5 MM MISC; Use once daily as directed  Dispense: 100 each; Refill: 0  Exercise goals: Increase water exercise to 2-3 days per week.  Behavioral modification strategies: increasing water intake, decreasing eating out, no skipping meals, keeping healthy foods in the home, and decreasing junk food.  [name] has agreed to  follow-up with our clinic in 3 weeks. She was informed of the importance of frequent follow-up visits to maximize her success with intensive lifestyle modifications for her multiple health conditions.   Objective:   Blood pressure 106/75, pulse 80, temperature 98.5 F (36.9 C), height '5\' 7"'$  (1.702 m), weight (!) 319 lb (144.7 kg), last menstrual period 11/02/2014, SpO2 97 %. Body mass index is 49.96 kg/m.  General: Cooperative, alert, well developed, in no acute distress. HEENT: Conjunctivae and lids unremarkable. Cardiovascular: Regular rhythm.  Lungs: Normal work of breathing. Neurologic: No focal deficits.   Lab Results  Component Value Date   CREATININE 0.80 11/16/2021   BUN 9 11/16/2021   NA 141 11/16/2021   K 4.3 11/16/2021   CL 101 11/16/2021   CO2 25 11/16/2021   Lab Results  Component Value Date   ALT 22 11/16/2021   AST 15 11/16/2021   ALKPHOS 68 11/16/2021   BILITOT 1.2 11/16/2021   Lab Results  Component Value Date   HGBA1C 5.8 (H) 11/16/2021   HGBA1C 5.8 06/25/2021   HGBA1C 5.7 10/16/2017   Lab Results  Component Value Date   INSULIN 17.5 11/16/2021   Lab Results  Component Value Date   TSH 5.360 (H) 11/16/2021   Lab Results  Component Value Date   CHOL 159 06/25/2021   HDL 38.90 (L) 06/25/2021   LDLCALC 92 06/25/2021   TRIG 140.0 06/25/2021   CHOLHDL 4 06/25/2021   Lab Results  Component Value Date   VD25OH 21.3 (L) 11/16/2021   VD25OH 13.51 (L) 06/25/2021   Lab Results  Component Value Date   WBC 9.4 11/16/2021   HGB 13.5 11/16/2021   HCT 40.6 11/16/2021   MCV 90 11/16/2021   PLT 280 11/16/2021   No results found for: "IRON", "TIBC", "FERRITIN"  Attestation Statements:   Reviewed by clinician on day of visit: allergies, medications, problem list, medical history, surgical history, family history, social history, and previous encounter notes.   Wilhemena Durie, am acting as transcriptionist for Loyal Gambler, DO.  I have reviewed  the above documentation for accuracy and completeness, and I agree with the above. Dell Ponto, DO

## 2021-12-07 NOTE — Therapy (Addendum)
OUTPATIENT PHYSICAL THERAPY LOWER EXTREMITY EVALUATION   Patient Name: [name] MRN: [id number] DOB:October 17, 1970, 51 y.o., female Today's Date: 12/08/2021   PT End of Session - 12/07/21 1613     Visit Number 1    Number of Visits 12    Date for PT Re-Evaluation 01/18/22    PT Start Time 8295    PT Stop Time 1558    PT Time Calculation (min) 43 min    Activity Tolerance Patient tolerated treatment well    Behavior During Therapy [facility] for tasks assessed/performed             Past Medical History:  Diagnosis Date   Anxiety    Arthritis    Asthma    Back pain    Barrett's esophagus    Chemotherapy-induced neuropathy (HCC)    Chronic lower back pain    Depression    Dyspnea    Elevated blood pressure, situational 05/05/2016   Endometriosis    Exercise-induced asthma 09/08/2015   Fatigue    Gallbladder problem    GERD (gastroesophageal reflux disease)    Heartburn    History of kidney stones    Hypertension    Hypothyroidism    Infertility, female    Joint pain    Joint stiffness    Lower extremity edema    Migraine    ovarian cancer    Ovarian cancer, bilateral ([place]) 11/28/2014   PONV (postoperative nausea and vomiting)    Sarcoidosis    Sleep apnea    SOBOE (shortness of breath on exertion)    Thyroid disease    Umbilical hernia    Vitamin D deficiency    woke up while porta cath removed    Past Surgical History:  Procedure Laterality Date   ABDOMINAL HYSTERECTOMY  11/10/2014   at [facility], Exp lap, supracervical hyst, BSO, infracolic omentectomy, lymphadenectomy, aortic lymph node sampling   BIOPSY  11/04/2021   Procedure: BIOPSY;  Surgeon: Jackquline Denmark, MD;  Location: [facility];  Service: Gastroenterology;;   BRONCHIAL NEEDLE ASPIRATION BIOPSY  12/16/2020   Procedure: BRONCHIAL NEEDLE ASPIRATION BIOPSIES;  Surgeon: Rigoberto Noel, MD;  Location: [facility] ENDOSCOPY;  Service: Cardiopulmonary;;   BRONCHIAL WASHINGS  12/16/2020   Procedure: BRONCHIAL  WASHINGS;  Surgeon: Rigoberto Noel, MD;  Location: [place];  Service: Cardiopulmonary;;   BUNIONECTOMY Left 09/14/2021   CHOLECYSTECTOMY N/A 07/24/2018   Procedure: LAPAROSCOPIC CHOLECYSTECTOMY WITH INTRAOPERATIVE CHOLANGIOGRAM;  Surgeon: Armandina Gemma, MD;  Location: [facility];  Service: General;  Laterality: N/A;   COLONOSCOPY WITH PROPOFOL N/A 11/04/2021   Procedure: COLONOSCOPY WITH PROPOFOL;  Surgeon: Jackquline Denmark, MD;  Location: [facility];  Service: Gastroenterology;  Laterality: N/A;   ERCP N/A 07/26/2018   Procedure: ENDOSCOPIC RETROGRADE CHOLANGIOPANCREATOGRAPHY (ERCP);  Surgeon: Ladene Artist, MD;  Location: [facility];  Service: Endoscopy;  Laterality: N/A;   ESOPHAGOGASTRODUODENOSCOPY (EGD) WITH PROPOFOL N/A 11/04/2021   Procedure: ESOPHAGOGASTRODUODENOSCOPY (EGD) WITH PROPOFOL;  Surgeon: Jackquline Denmark, MD;  Location: [facility];  Service: Gastroenterology;  Laterality: N/A;   GANGLION CYST EXCISION Right    hand   HYSTERECTOMY ABDOMINAL WITH SALPINGO-OOPHORECTOMY  11/10/2014   ovarian cancer, tumor removal   INCISIONAL HERNIA REPAIR N/A 12/25/2020   Procedure: LAPAROSCOPIC INCISIONAL HERNIA REPAIR WITH MESH;  Surgeon: Kinsinger, Arta Bruce, MD;  Location: [facility];  Service: General;  Laterality: N/A;   IR GENERIC HISTORICAL  05/12/2016   IR REMOVAL TUN ACCESS W/ PORT W/O FL MOD SED 05/12/2016 [facility]-INTER[facility] RAD   LAPAROSCOPIC ENDOMETRIOSIS  FULGURATION     LIPOMA EXCISION Left    ankle   meniscal tear Right    MENISCUS REPAIR Right    POLYPECTOMY  11/04/2021   Procedure: POLYPECTOMY;  Surgeon: Jackquline Denmark, MD;  Location: [facility];  Service: Gastroenterology;;   Glori Luis a cath removal     PORTA CATH INSERTION     REMOVAL OF STONES  07/26/2018   Procedure: REMOVAL OF STONES;  Surgeon: Ladene Artist, MD;  Location: [facility];  Service: Endoscopy;;   SPHINCTEROTOMY  07/26/2018   Procedure: Joan Mayans;  Surgeon: Ladene Artist, MD;  Location: [facility];   Service: Endoscopy;;   UMBILICAL HERNIA REPAIR  12/29/2020   VIDEO BRONCHOSCOPY WITH ENDOBRONCHIAL ULTRASOUND N/A 12/16/2020   Procedure: VIDEO BRONCHOSCOPY WITH ENDOBRONCHIAL ULTRASOUND;  Surgeon: Rigoberto Noel, MD;  Location: [facility];  Service: Cardiopulmonary;  Laterality: N/A;   Patient Active Problem List   Diagnosis Date Noted   SOBOE (shortness of breath on exertion) 11/16/2021   Elevated glucose 11/16/2021   Vitamin D deficiency 11/16/2021   Prediabetes 11/16/2021   Bunion, left 08/06/2021   Morbid obesity ([place]) 06/26/2021   Venous insufficiency 06/25/2021   Incisional hernia 12/25/2020   Mediastinal lymphadenopathy    Pulmonary nodules 10/20/2020   Multiple skin nodules 07/24/2020   Viral illness 06/14/2020   Right lumbar radiculopathy 11/02/9483   Periumbilical hernia 46/27/0350   TB lung, latent 05/18/2020   Acute non-recurrent maxillary sinusitis 11/09/2019   HTN (hypertension) 04/14/2019   Mass of left wrist 04/14/2019   External otitis of right ear 04/14/2019   Choledocholithiasis    Abnormal cholangiogram    Cholecystitis with cholelithiasis 07/24/2018   Hyperglycemia 10/16/2017   Hot flash not due to menopause 10/16/2017   Eczema 10/16/2017   Flu-like symptoms 06/20/2017   Cognitive changes 01/30/2017   Disturbed concentration 01/26/2017   Left shoulder pain 11/01/2016   Right low back pain 11/01/2016   Elevated blood pressure, situational 05/05/2016   Surgical menopause 01/29/2016   OSA on CPAP 12/22/2015   Cough 11/26/2015   Capsulitis 10/06/2015   Ganglion cyst of left foot 10/06/2015   Fatigue 09/13/2015   Asthma 09/08/2015   Exertional dyspnea 09/08/2015   Peroneal ganglion cyst 07/20/2015   Low back pain 07/20/2015   Peroneal tendinitis of left lower leg 06/25/2015   Nonallopathic lesion of cervical region 06/25/2015   Wheezing 05/08/2015   Polyarthralgia 05/08/2015   Peripheral edema 03/16/2015   Central line complication 09/38/1829    Morbid obesity with BMI of 45.0-49.9, adult ([place]) 02/28/2015   Chemotherapy induced neutropenia ([place]) 02/28/2015   Screening for colorectal cancer 01/05/2015   Chemotherapy induced nausea and vomiting 01/02/2015   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   Premature surgical menopause 12/17/2014   Leukopenia due to antineoplastic chemotherapy ([place]) 12/17/2014   Port-A-Cath in place 12/17/2014   Encounter for antineoplastic chemotherapy 12/17/2014   Myalgia 12/09/2014   Hypersensitivity reaction 12/05/2014   Poor venous access 11/28/2014   Postoperative cellulitis of surgical wound 11/24/2014   Tobacco dependence 11/12/2014   Malignant neoplasm of both ovaries 11/10/2014   Anxiety 11/10/2014   Adnexal mass 11/10/2014   Encounter for well adult exam with abnormal findings 09/10/2014   Hypothyroidism 09/10/2014   Hypersomnolence 09/10/2014   Acute non-recurrent frontal sinusitis 06/24/2014   Right knee pain 06/24/2014   Lower back pain 01/08/2014   EAR PAIN, BILATERAL 12/18/2009   KNEE PAIN, RIGHT 12/18/2009   INGROWN TOENAIL 12/17/2008   BACK PAIN 12/17/2008  Acute bronchospasm 11/11/2008   Depression 11/29/2007   Insomnia 11/29/2007   Allergic rhinitis 04/13/2007   GERD 04/13/2007   Endometriosis determined by laparoscopy 04/13/2007   Migraine headache 01/29/2007    PCP: Dr Ina Kick   REFERRING PROVIDER: Dr Annice Needy   REFERRING DIAG: Left foot bunionectomy ( Great Toe)   THERAPY DIAG:  Pain in right ankle and joints of right foot  Stiffness of right ankle, not elsewhere classified  Other abnormalities of gait and mobility  Rationale for Evaluation and Treatment Rehabilitation  ONSET DATE: 09/13/2021  SUBJECTIVE:   SUBJECTIVE STATEMENT: The patient had a bunionectomy on 09/13/2021. She was only able to do a few visits of therapy before she had to stop because of other things going on. Her pain has remained constant with activity . She has  significant pain standing and walking. She can not wear sandles and less supportive shoes because of swelling.   PERTINENT HISTORY: Anxiety, right knee arthritis; back pain, depression, dyspnea, chemo therapy induced neuropathy; LE edema, ovarian cancer ;   PAIN:  Are you having pain? Yes: NPRS scale: 5/10 Pain location: left foot on top of the great toe  Pain description: aching and burning  Aggravating factors: standing and walking  Relieving factors: not putting pressure on the foot   PRECAUTIONS: None  WEIGHT BEARING RESTRICTIONS No  FALLS:  Has patient fallen in last 6 months? No  LIVING ENVIRONMENT: Steps in the house.  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   PLOF: Independent  PATIENT GOALS : to have less pain/ to walk better/ To get back into the gym if able    OBJECTIVE:   DIAGNOSTIC FINDINGS:  X-rays: Nothing significant   PATIENT SURVEYS:  FOTO    COGNITION:  Overall cognitive status: Within functional limits for tasks assessed     SENSATION: Baseline neuropathy from chemo in 2016   EDEMA:  Swelling when she is up and on it  eg  POSTURE: No Significant postural limitations  PALPATION:   LOWER EXTREMITY ROM:  Active ROM Left eval Right  eval  Hip flexion    Hip extension    Hip abduction    Hip adduction    Hip internal rotation    Hip external rotation    Knee flexion    Knee extension    Ankle dorsiflexion 0 13  Ankle plantarflexion 40   Ankle inversion WNL   Ankle eversion WNL     (Blank rows = not tested)  PROM  Left eval Right  eval  Hip flexion    Hip extension    Hip abduction    Hip adduction    Hip internal rotation    Hip external rotation    Knee flexion    Knee extension    Ankle dorsiflexion 5 pulling on the bottom of the foot noted  15  Ankle plantarflexion WNL   Ankle inversion WNL   Ankle eversion WNL     (Blank rows = not tested)  LOWER EXTREMITY MMT:  MMT Right eval Left eval  Hip flexion  33.0 25.6  Hip extension    Hip abduction 32.6 35.5  Hip adduction    Hip internal rotation    Hip external rotation    Knee flexion    Knee extension 17.4 39.5  Ankle dorsiflexion    Ankle plantarflexion    Ankle inversion    Ankle eversion     (Blank rows = not tested)  FUNCTIONAL TESTS:  Shifts away from right foot with sit to stand transfer  GAIT:  Significant lateral movement away form the great toe.Can see foot pressing into the lateral portion of the shoe. Decreased weight bearing on the left     TODAY'S TREATMENT: Exercises - Ankle Dorsiflexion with Resistance  - 1 x daily - 7 x weekly - 3 sets - 10 reps - Ankle and Toe Plantarflexion with Resistance  - 1 x daily - 7 x weekly - 3 sets - 10 reps - Long Sitting Calf Stretch with Strap  - 3 x daily - 7 x weekly - 1 sets - 3 reps - 20 hold - Seated Self Great Toe Mobilization  - 1 x daily - 7 x weekly - 3 sets - 10 reps   PATIENT EDUCATION:  Education details: HEP; benefits of aquatics  Person educated: Patient Education method: Explanation, Demonstration, Tactile cues, Verbal cues, and Handouts Education comprehension: verbalized understanding, returned demonstration, verbal cues required, tactile cues required, and needs further education   HOME EXERCISE PROGRAM: Access Code: [email or web address] URL: [email or web address] Date: 12/08/2021 Prepared by: Carolyne Littles  Exercises - Ankle Dorsiflexion with Resistance  - 1 x daily - 7 x weekly - 3 sets - 10 reps - Ankle and Toe Plantarflexion with Resistance  - 1 x daily - 7 x weekly - 3 sets - 10 reps - Long Sitting Calf Stretch with Strap  - 3 x daily - 7 x weekly - 1 sets - 3 reps - 20 hold - Seated Self Great Toe Mobilization  - 1 x daily - 7 x weekly - 3 sets - 10 reps   ASSESSMENT:  CLINICAL IMPRESSION: Patient is a 51 y.o. female  who was seen today for physical therapy evaluation and treatment for . She has significant supination with gait  and a  decrease in single leg stance time on the left. She has gross left LE strength. She has knee OA on the right and low back pain. She has stiffness in the ankle with DF and PF. She would benefit from skilled therapy to improve ability to ambulate and perform daily tasks without sigifcant pain. She also has pain on the bottom of her foot along the medial aspect of her arch. She has custom orthotics and [employer]. The orthotics were made prior to the surgery. She feels like they are pushing her into supination with gait.    OBJECTIVE IMPAIRMENTS Abnormal gait, decreased activity tolerance, decreased endurance, decreased mobility, difficulty walking, decreased ROM, decreased strength, increased edema, and pain.   ACTIVITY LIMITATIONS lifting, bending, standing, squatting, stairs, transfers, and locomotion level  PARTICIPATION LIMITATIONS: meal prep, cleaning, shopping, community activity, occupation, and yard work  PERSONAL FACTORS Fitness and 1-2 comorbidities: low back pain and teral knee pain   are also affecting patient's functional outcome.   REHAB POTENTIAL: Good  CLINICAL DECISION MAKING: Evolving/moderate complexity  EVALUATION COMPLEXITY: High   GOALS: Goals reviewed with patient? Yes  SHORT TERM GOALS: Target date: 01/05/2022  Patient will increase active DF by 5 degrees  Baseline: Goal status: INITIAL  2.  Patient will demonstrate reduced lateral movement away from the great toe ambualting  Baseline:  Goal status: INITIAL  3.  Patient will increase gross Left LE strength by 5 lbs  Baseline:  Goal status: INITIAL  LONG TERM GOALS: Target date: 02/02/2022   Patient will stand for 1/2 hour without pain in order to perform ADL's  Baseline:  Goal status: INITIAL  2.  Patient will ambulate  3000' without pain in order to go grocery shopping without pain  Baseline:  Goal status: INITIAL  3.  Patient will go up and down 6 steps without pain  Baseline:  Goal status:  INITIAL    PLAN: PT FREQUENCY: 2x/week  PT DURATION: 8 weeks  PLANNED INTERVENTIONS: Therapeutic exercises, Therapeutic activity, Neuromuscular re-education, Balance training, Gait training, Patient/Family education, Self Care, Joint mobilization, Aquatic Therapy, Dry Needling, Cryotherapy, Moist heat, Ultrasound, Ionotophoresis '4mg'$ /ml Dexamethasone, Manual therapy, and Re-evaluation  PLAN FOR NEXT SESSION: in the water work on normalizing gait; improving ankle strength, improve gross strength of the left leg; improve ability to weight bear   Carney Living, PT 12/08/2021, 7:28 PM

## 2021-12-08 ENCOUNTER — Encounter (HOSPITAL_BASED_OUTPATIENT_CLINIC_OR_DEPARTMENT_OTHER): Payer: Self-pay | Admitting: Physical Therapy

## 2021-12-09 ENCOUNTER — Ambulatory Visit (HOSPITAL_BASED_OUTPATIENT_CLINIC_OR_DEPARTMENT_OTHER): Payer: 59 | Admitting: Physical Therapy

## 2021-12-09 ENCOUNTER — Encounter (HOSPITAL_BASED_OUTPATIENT_CLINIC_OR_DEPARTMENT_OTHER): Payer: Self-pay | Admitting: Physical Therapy

## 2021-12-09 DIAGNOSIS — M545 Low back pain, unspecified: Secondary | ICD-10-CM | POA: Diagnosis not present

## 2021-12-09 DIAGNOSIS — M25572 Pain in left ankle and joints of left foot: Secondary | ICD-10-CM

## 2021-12-09 DIAGNOSIS — M25372 Other instability, left ankle: Secondary | ICD-10-CM | POA: Diagnosis not present

## 2021-12-09 DIAGNOSIS — M25571 Pain in right ankle and joints of right foot: Secondary | ICD-10-CM | POA: Diagnosis not present

## 2021-12-09 DIAGNOSIS — R262 Difficulty in walking, not elsewhere classified: Secondary | ICD-10-CM | POA: Diagnosis not present

## 2021-12-09 DIAGNOSIS — R2689 Other abnormalities of gait and mobility: Secondary | ICD-10-CM

## 2021-12-09 DIAGNOSIS — R269 Unspecified abnormalities of gait and mobility: Secondary | ICD-10-CM | POA: Diagnosis not present

## 2021-12-09 DIAGNOSIS — M21612 Bunion of left foot: Secondary | ICD-10-CM | POA: Diagnosis not present

## 2021-12-09 DIAGNOSIS — M25672 Stiffness of left ankle, not elsewhere classified: Secondary | ICD-10-CM

## 2021-12-09 NOTE — Therapy (Signed)
OUTPATIENT PHYSICAL THERAPY LOWER EXTREMITY TREATMENT NOTE   Patient Name: [name] MRN: [id number] DOB:1971/04/19, 51 y.o., female Today's Date: 12/09/2021   PT End of Session - 12/09/21 1704     Visit Number 2    Number of Visits 12    Date for PT Re-Evaluation 01/18/22    PT Start Time 1701    PT Stop Time 1740    PT Time Calculation (min) 39 min             Past Medical History:  Diagnosis Date   Anxiety    Arthritis    Asthma    Back pain    Barrett's esophagus    Chemotherapy-induced neuropathy (HCC)    Chronic lower back pain    Depression    Dyspnea    Elevated blood pressure, situational 05/05/2016   Endometriosis    Exercise-induced asthma 09/08/2015   Fatigue    Gallbladder problem    GERD (gastroesophageal reflux disease)    Heartburn    History of kidney stones    Hypertension    Hypothyroidism    Infertility, female    Joint pain    Joint stiffness    Lower extremity edema    Migraine    ovarian cancer    Ovarian cancer, bilateral ([place]) 11/28/2014   PONV (postoperative nausea and vomiting)    Sarcoidosis    Sleep apnea    SOBOE (shortness of breath on exertion)    Thyroid disease    Umbilical hernia    Vitamin D deficiency    woke up while porta cath removed    Past Surgical History:  Procedure Laterality Date   ABDOMINAL HYSTERECTOMY  11/10/2014   at [facility], Exp lap, supracervical hyst, BSO, infracolic omentectomy, lymphadenectomy, aortic lymph node sampling   BIOPSY  11/04/2021   Procedure: BIOPSY;  Surgeon: Jackquline Denmark, MD;  Location: [facility];  Service: Gastroenterology;;   BRONCHIAL NEEDLE ASPIRATION BIOPSY  12/16/2020   Procedure: BRONCHIAL NEEDLE ASPIRATION BIOPSIES;  Surgeon: Rigoberto Noel, MD;  Location: [facility] ENDOSCOPY;  Service: Cardiopulmonary;;   BRONCHIAL WASHINGS  12/16/2020   Procedure: BRONCHIAL WASHINGS;  Surgeon: Rigoberto Noel, MD;  Location: [place];  Service: Cardiopulmonary;;   BUNIONECTOMY Left  09/14/2021   CHOLECYSTECTOMY N/A 07/24/2018   Procedure: LAPAROSCOPIC CHOLECYSTECTOMY WITH INTRAOPERATIVE CHOLANGIOGRAM;  Surgeon: Armandina Gemma, MD;  Location: [facility];  Service: General;  Laterality: N/A;   COLONOSCOPY WITH PROPOFOL N/A 11/04/2021   Procedure: COLONOSCOPY WITH PROPOFOL;  Surgeon: Jackquline Denmark, MD;  Location: [facility];  Service: Gastroenterology;  Laterality: N/A;   ERCP N/A 07/26/2018   Procedure: ENDOSCOPIC RETROGRADE CHOLANGIOPANCREATOGRAPHY (ERCP);  Surgeon: Ladene Artist, MD;  Location: [facility];  Service: Endoscopy;  Laterality: N/A;   ESOPHAGOGASTRODUODENOSCOPY (EGD) WITH PROPOFOL N/A 11/04/2021   Procedure: ESOPHAGOGASTRODUODENOSCOPY (EGD) WITH PROPOFOL;  Surgeon: Jackquline Denmark, MD;  Location: [facility];  Service: Gastroenterology;  Laterality: N/A;   GANGLION CYST EXCISION Right    hand   HYSTERECTOMY ABDOMINAL WITH SALPINGO-OOPHORECTOMY  11/10/2014   ovarian cancer, tumor removal   INCISIONAL HERNIA REPAIR N/A 12/25/2020   Procedure: LAPAROSCOPIC INCISIONAL HERNIA REPAIR WITH MESH;  Surgeon: Kinsinger, Arta Bruce, MD;  Location: [facility];  Service: General;  Laterality: N/A;   IR GENERIC HISTORICAL  05/12/2016   IR REMOVAL TUN ACCESS W/ PORT W/O FL MOD SED 05/12/2016 [facility]   LAPAROSCOPIC ENDOMETRIOSIS FULGURATION     LIPOMA EXCISION Left    ankle   meniscal tear Right  MENISCUS REPAIR Right    POLYPECTOMY  11/04/2021   Procedure: POLYPECTOMY;  Surgeon: Jackquline Denmark, MD;  Location: [facility];  Service: Gastroenterology;;   Glori Luis a cath removal     PORTA CATH INSERTION     REMOVAL OF STONES  07/26/2018   Procedure: REMOVAL OF STONES;  Surgeon: Ladene Artist, MD;  Location: [facility];  Service: Endoscopy;;   SPHINCTEROTOMY  07/26/2018   Procedure: Joan Mayans;  Surgeon: Ladene Artist, MD;  Location: [facility];  Service: Endoscopy;;   UMBILICAL HERNIA REPAIR  12/29/2020   VIDEO BRONCHOSCOPY WITH ENDOBRONCHIAL ULTRASOUND N/A  12/16/2020   Procedure: VIDEO BRONCHOSCOPY WITH ENDOBRONCHIAL ULTRASOUND;  Surgeon: Rigoberto Noel, MD;  Location: [place];  Service: Cardiopulmonary;  Laterality: N/A;   Patient Active Problem List   Diagnosis Date Noted   SOBOE (shortness of breath on exertion) 11/16/2021   Elevated glucose 11/16/2021   Vitamin D deficiency 11/16/2021   Prediabetes 11/16/2021   Bunion, left 08/06/2021   Morbid obesity ([place]) 06/26/2021   Venous insufficiency 06/25/2021   Incisional hernia 12/25/2020   Mediastinal lymphadenopathy    Pulmonary nodules 10/20/2020   Multiple skin nodules 07/24/2020   Viral illness 06/14/2020   Right lumbar radiculopathy 88/50/2774   Periumbilical hernia 12/87/8676   TB lung, latent 05/18/2020   Acute non-recurrent maxillary sinusitis 11/09/2019   HTN (hypertension) 04/14/2019   Mass of left wrist 04/14/2019   External otitis of right ear 04/14/2019   Choledocholithiasis    Abnormal cholangiogram    Cholecystitis with cholelithiasis 07/24/2018   Hyperglycemia 10/16/2017   Hot flash not due to menopause 10/16/2017   Eczema 10/16/2017   Flu-like symptoms 06/20/2017   Cognitive changes 01/30/2017   Disturbed concentration 01/26/2017   Left shoulder pain 11/01/2016   Right low back pain 11/01/2016   Elevated blood pressure, situational 05/05/2016   Surgical menopause 01/29/2016   OSA on CPAP 12/22/2015   Cough 11/26/2015   Capsulitis 10/06/2015   Ganglion cyst of left foot 10/06/2015   Fatigue 09/13/2015   Asthma 09/08/2015   Exertional dyspnea 09/08/2015   Peroneal ganglion cyst 07/20/2015   Low back pain 07/20/2015   Peroneal tendinitis of left lower leg 06/25/2015   Nonallopathic lesion of cervical region 06/25/2015   Wheezing 05/08/2015   Polyarthralgia 05/08/2015   Peripheral edema 03/16/2015   Central line complication 72/01/4708   Morbid obesity with BMI of 45.0-49.9, adult ([place]) 02/28/2015   Chemotherapy induced neutropenia ([place]) 02/28/2015    Screening for colorectal cancer 01/05/2015   Chemotherapy induced nausea and vomiting 01/02/2015   Chemotherapy-induced peripheral neuropathy ([place]) 01/02/2015   Premature surgical menopause 12/17/2014   Leukopenia due to antineoplastic chemotherapy ([place]) 12/17/2014   Port-A-Cath in place 12/17/2014   Encounter for antineoplastic chemotherapy 12/17/2014   Myalgia 12/09/2014   Hypersensitivity reaction 12/05/2014   Poor venous access 11/28/2014   Postoperative cellulitis of surgical wound 11/24/2014   Tobacco dependence 11/12/2014   Malignant neoplasm of both ovaries 11/10/2014   Anxiety 11/10/2014   Adnexal mass 11/10/2014   Encounter for well adult exam with abnormal findings 09/10/2014   Hypothyroidism 09/10/2014   Hypersomnolence 09/10/2014   Acute non-recurrent frontal sinusitis 06/24/2014   Right knee pain 06/24/2014   Lower back pain 01/08/2014   EAR PAIN, BILATERAL 12/18/2009   KNEE PAIN, RIGHT 12/18/2009   INGROWN TOENAIL 12/17/2008   BACK PAIN 12/17/2008   Acute bronchospasm 11/11/2008   Depression 11/29/2007   Insomnia 11/29/2007   Allergic rhinitis 04/13/2007   GERD  04/13/2007   Endometriosis determined by laparoscopy 04/13/2007   Migraine headache 01/29/2007    PCP: Dr Ina Kick   REFERRING PROVIDER: Dr Annice Needy   REFERRING DIAG: Left foot bunionectomy ( Great Toe)   THERAPY DIAG:  Other abnormalities of gait and mobility  Pain in left ankle and joints of left foot  Stiffness of left ankle, not elsewhere classified  Rationale for Evaluation and Treatment Rehabilitation  ONSET DATE: 09/13/2021  SUBJECTIVE:   SUBJECTIVE STATEMENT: Pt reports no new changes since evaluation.   PERTINENT HISTORY: Anxiety, right knee arthritis; back pain, depression, dyspnea, chemo therapy induced neuropathy; LE edema, ovarian cancer ;   PAIN:  Are you having pain? No:  NPRS scale: 0/10 at rest, 3/10 with walking Pain location: left foot on top of the great  toe  Pain description: aching and burning  Aggravating factors: standing and walking  Relieving factors: not putting pressure on the foot   PRECAUTIONS: None  WEIGHT BEARING RESTRICTIONS No  FALLS:  Has patient fallen in last 6 months? No  LIVING ENVIRONMENT: Steps in the house.  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   PLOF: Independent  PATIENT GOALS : to have less pain/ to walk better/ To get back into the gym if able    OBJECTIVE:   DIAGNOSTIC FINDINGS:  X-rays: Nothing significant   PATIENT SURVEYS:  FOTO    COGNITION:  Overall cognitive status: Within functional limits for tasks assessed     SENSATION: Baseline neuropathy from chemo in 2016   EDEMA:  Swelling when she is up and on it  eg  POSTURE: No Significant postural limitations  PALPATION:   LOWER EXTREMITY ROM:  Active ROM Right eval Left eval  Hip flexion    Hip extension    Hip abduction    Hip adduction    Hip internal rotation    Hip external rotation    Knee flexion    Knee extension    Ankle dorsiflexion 0 13  Ankle plantarflexion 40   Ankle inversion WNL   Ankle eversion WNL     (Blank rows = not tested)  PROM  Right eval Left eval  Hip flexion    Hip extension    Hip abduction    Hip adduction    Hip internal rotation    Hip external rotation    Knee flexion    Knee extension    Ankle dorsiflexion 5 pulling on the bottom of the foot noted  15  Ankle plantarflexion WNL   Ankle inversion WNL   Ankle eversion WNL     (Blank rows = not tested)  LOWER EXTREMITY MMT:  MMT Right eval Left eval  Hip flexion 33.0 25.6  Hip extension    Hip abduction 32.6 35.5  Hip adduction    Hip internal rotation    Hip external rotation    Knee flexion    Knee extension 17.4 39.5  Ankle dorsiflexion    Ankle plantarflexion    Ankle inversion    Ankle eversion     (Blank rows = not tested)  FUNCTIONAL TESTS:  Shifts away from right foot with sit to stand transfer   GAIT:  Significant lateral movement away form the great toe.Can see foot pressing into the lateral portion of the shoe. Decreased weight bearing on the left     TODAY'S TREATMENT: Pt seen for aquatic therapy today.  Treatment took place in water 3.25-4.5 ft in depth at the [facility].  Temp of water was 91.  Pt entered/exited the pool via stairs independently with bilat rail.  * weight shifts forward- working on rolling through toes, then flat foot to DF * forward walk with rolling through feet * side stepping  * high knee marching with PF when knee flexed * straddling yellow noodle:  bicycle motion; jumping jack motion * backwards walk, rolling through feet * Heel/toe raises holding wall * 2 reps of step down RLE with heavy UE on rails (on 2nd step)  Once dried off: applied reg Rock tape to L medial foot with 20% stretch (metatarsal head to just past navicular) and perpendicular strip applied over navicular - to decompress tissue, aid in desensitization and increase proprioception   Pt requires the buoyancy and hydrostatic pressure of water for support, and to offload joints by unweighting joint load by at least 50 % in naval deep water and by at least 75-80% in chest to neck deep water.  Viscosity of the water is needed for resistance of strengthening. Water current perturbations provides challenge to standing balance requiring increased core activation.     PATIENT EDUCATION:  Education details: HEP; benefits of aquatics  Person educated: Patient Education method: Explanation, Demonstration, Tactile cues, Verbal cues, and Handouts Education comprehension: verbalized understanding, returned demonstration, verbal cues required, tactile cues required, and needs further education   HOME EXERCISE PROGRAM: Access Code: [email or web address] URL: [email or web address] Date: 12/08/2021 Prepared by: Carolyne Littles  Exercises - Ankle Dorsiflexion with Resistance  - 1  x daily - 7 x weekly - 3 sets - 10 reps - Ankle and Toe Plantarflexion with Resistance  - 1 x daily - 7 x weekly - 3 sets - 10 reps - Long Sitting Calf Stretch with Strap  - 3 x daily - 7 x weekly - 1 sets - 3 reps - 20 hold - Seated Self Great Toe Mobilization  - 1 x daily - 7 x weekly - 3 sets - 10 reps   ASSESSMENT:  CLINICAL IMPRESSION: Pt is confident in aquatic setting; able to take direction from therapist on deck.  She reported some increase in tightness along medial instep of foot with increased toe ext during toe off of gait, reduced with change in exercise.  All exercises performed in water at sternum level. She seems to have some sensitivity across top of foot when pedaling legs like bicycle while suspended in deeper water. Goals are ongoing.    OBJECTIVE IMPAIRMENTS Abnormal gait, decreased activity tolerance, decreased endurance, decreased mobility, difficulty walking, decreased ROM, decreased strength, increased edema, and pain.   ACTIVITY LIMITATIONS lifting, bending, standing, squatting, stairs, transfers, and locomotion level  PARTICIPATION LIMITATIONS: meal prep, cleaning, shopping, community activity, occupation, and yard work  PERSONAL FACTORS Fitness and 1-2 comorbidities: low back pain and teral knee pain   are also affecting patient's functional outcome.   REHAB POTENTIAL: Good  CLINICAL DECISION MAKING: Evolving/moderate complexity  EVALUATION COMPLEXITY: High   GOALS: Goals reviewed with patient? Yes  SHORT TERM GOALS: Target date: 01/06/2022  Patient will increase active DF by 5 degrees  Baseline: Goal status: INITIAL  2.  Patient will demonstrate reduced lateral movement away from the great toe ambualting  Baseline:  Goal status: INITIAL  3.  Patient will increase gross right LE strength by 5 lbs  Baseline:  Goal status: INITIAL  LONG TERM GOALS: Target date: 02/03/2022   Patient will stand for 1/2 hour without pain in order to perform ADL's   Baseline:  Goal status:  INITIAL  2.  Patient will ambulate 3000' without pain in order to go grocery shopping without pain  Baseline:  Goal status: INITIAL  3.  Patient will go up and down 6 steps without pain  Baseline:  Goal status: INITIAL    PLAN: PT FREQUENCY: 2x/week  PT DURATION: 8 weeks  PLANNED INTERVENTIONS: Therapeutic exercises, Therapeutic activity, Neuromuscular re-education, Balance training, Gait training, Patient/Family education, Self Care, Joint mobilization, Aquatic Therapy, Dry Needling, Cryotherapy, Moist heat, Ultrasound, Ionotophoresis '4mg'$ /ml Dexamethasone, Manual therapy, and Re-evaluation  PLAN FOR NEXT SESSION: in the water work on normalizing gait; improving ankle strength, improve gross strength of the left leg; improve ability to weight bear;  assess response to ktape and aquatics.  Kerin Perna, PTA 12/09/21 6:17 PM

## 2021-12-15 ENCOUNTER — Encounter (INDEPENDENT_AMBULATORY_CARE_PROVIDER_SITE_OTHER): Payer: Self-pay

## 2021-12-17 ENCOUNTER — Ambulatory Visit (HOSPITAL_BASED_OUTPATIENT_CLINIC_OR_DEPARTMENT_OTHER): Payer: Self-pay | Admitting: Physical Therapy

## 2021-12-17 ENCOUNTER — Other Ambulatory Visit (HOSPITAL_BASED_OUTPATIENT_CLINIC_OR_DEPARTMENT_OTHER): Payer: Self-pay

## 2021-12-17 ENCOUNTER — Ambulatory Visit: Payer: 59 | Admitting: Internal Medicine

## 2021-12-17 ENCOUNTER — Encounter: Payer: Self-pay | Admitting: Internal Medicine

## 2021-12-17 VITALS — BP 114/62 | HR 78 | Temp 98.6°F | Ht 67.0 in | Wt 322.2 lb

## 2021-12-17 DIAGNOSIS — E038 Other specified hypothyroidism: Secondary | ICD-10-CM

## 2021-12-17 DIAGNOSIS — G4733 Obstructive sleep apnea (adult) (pediatric): Secondary | ICD-10-CM | POA: Diagnosis not present

## 2021-12-17 DIAGNOSIS — N951 Menopausal and female climacteric states: Secondary | ICD-10-CM

## 2021-12-17 DIAGNOSIS — F988 Other specified behavioral and emotional disorders with onset usually occurring in childhood and adolescence: Secondary | ICD-10-CM

## 2021-12-17 DIAGNOSIS — F3289 Other specified depressive episodes: Secondary | ICD-10-CM | POA: Diagnosis not present

## 2021-12-17 MED ORDER — AMPHETAMINE-DEXTROAMPHET ER 20 MG PO CP24
20.0000 mg | ORAL_CAPSULE | Freq: Every day | ORAL | 0 refills | Status: DC
  Filled 2021-12-17: qty 30, 30d supply, fill #0

## 2021-12-17 MED ORDER — ESTRADIOL 1 MG PO TABS
1.0000 mg | ORAL_TABLET | Freq: Every day | ORAL | 3 refills | Status: DC
  Filled 2021-12-17: qty 90, 90d supply, fill #0
  Filled 2022-02-10 – 2022-03-01 (×2): qty 90, 90d supply, fill #1
  Filled 2022-06-15: qty 90, 90d supply, fill #2
  Filled 2022-09-28: qty 90, 90d supply, fill #3

## 2021-12-17 MED ORDER — BUPROPION HCL ER (XL) 150 MG PO TB24
150.0000 mg | ORAL_TABLET | Freq: Every day | ORAL | 3 refills | Status: DC
  Filled 2021-12-17: qty 90, 90d supply, fill #0
  Filled 2022-02-10 – 2022-03-01 (×2): qty 90, 90d supply, fill #1
  Filled 2022-06-15: qty 90, 90d supply, fill #2
  Filled 2022-09-28: qty 90, 90d supply, fill #3

## 2021-12-17 MED ORDER — LEVOTHYROXINE SODIUM 100 MCG PO TABS
100.0000 ug | ORAL_TABLET | Freq: Every day | ORAL | 3 refills | Status: DC
  Filled 2021-12-17: qty 90, 90d supply, fill #0
  Filled 2022-02-10 – 2022-03-01 (×2): qty 90, 90d supply, fill #1
  Filled 2022-06-15: qty 90, 90d supply, fill #2
  Filled 2022-09-28: qty 90, 90d supply, fill #3

## 2021-12-17 NOTE — Patient Instructions (Addendum)
Ok to increase the levothyroxine to 100 mcg per day  Please take all new medication as prescribed - the wellbutrin XL 150 mg per day  Ok to restart the HFT estradiol at 1 mg daily  Please take all new medication as prescribed - the adderall XR 20 mg per day  Please continue all other medications as before, and refills have been done if requested.  Please have the pharmacy call with any other refills you may need.  Please keep your appointments with your specialists as you may have planned  Please go to the [facility] in 4 wks to recheck the thyroid testing  Please make an Appointment to return in 6 months, or sooner if needed

## 2021-12-17 NOTE — Progress Notes (Unsigned)
Patient ID: [name], female   DOB: 1970/09/24, 51 y.o.   MRN: [id number]        Chief Complaint: follow up elevated TSH, depression anxiety, hot flash, ADD       HPI:  [name] is a 51 y.o. female here with c/o elevated TSH with recent lab per wt loss clinic, Denies hyper or hypo thyroid symptoms such as voice, skin or hair change.  Does have increased hot flashes on HRt holiday and wold like to restart.  Also with worsening uncontrolled ADD symptoms, hard to function and complete her work and [employer] loss adjust[employer], also had had at least mild to mod worsening depressive symptoms, but no suicidal ideation, or panic; has ongoing anxiety, less an issue than the depression it seams.  Difficult to lose wt.         Wt Readings from Last 3 Encounters:  12/17/21 (!) 322 lb 3.2 oz (146.1 kg)  11/30/21 (!) 319 lb (144.7 kg)  11/16/21 (!) 333 lb (151 kg)   BP Readings from Last 3 Encounters:  12/17/21 114/62  11/30/21 106/75  11/16/21 104/71         Past Medical History:  Diagnosis Date   Anxiety    Arthritis    Asthma    Back pain    Barrett's esophagus    Chemotherapy-induced neuropathy (HCC)    Chronic lower back pain    Depression    Dyspnea    Elevated blood pressure, situational 05/05/2016   Endometriosis    Exercise-induced asthma 09/08/2015   Fatigue    Gallbladder problem    GERD (gastroesophageal reflux disease)    Heartburn    History of kidney stones    Hypertension    Hypothyroidism    Infertility, female    Joint pain    Joint stiffness    Lower extremity edema    Migraine    ovarian cancer    Ovarian cancer, bilateral ([place]) 11/28/2014   PONV (postoperative nausea and vomiting)    Sarcoidosis    Sleep apnea    SOBOE (shortness of breath on exertion)    Thyroid disease    Umbilical hernia    Vitamin D deficiency    woke up while porta cath removed    Past Surgical History:  Procedure Laterality Date   ABDOMINAL HYSTERECTOMY  11/10/2014   at [facility],  Exp lap, supracervical hyst, BSO, infracolic omentectomy, lymphadenectomy, aortic lymph node sampling   BIOPSY  11/04/2021   Procedure: BIOPSY;  Surgeon: Jackquline Denmark, MD;  Location: [facility];  Service: Gastroenterology;;   BRONCHIAL NEEDLE ASPIRATION BIOPSY  12/16/2020   Procedure: BRONCHIAL NEEDLE ASPIRATION BIOPSIES;  Surgeon: Rigoberto Noel, MD;  Location: [facility];  Service: Cardiopulmonary;;   BRONCHIAL WASHINGS  12/16/2020   Procedure: BRONCHIAL WASHINGS;  Surgeon: Rigoberto Noel, MD;  Location: [facility];  Service: Cardiopulmonary;;   BUNIONECTOMY Left 09/14/2021   CHOLECYSTECTOMY N/A 07/24/2018   Procedure: LAPAROSCOPIC CHOLECYSTECTOMY WITH INTRAOPERATIVE CHOLANGIOGRAM;  Surgeon: Armandina Gemma, MD;  Location: [facility];  Service: General;  Laterality: N/A;   COLONOSCOPY WITH PROPOFOL N/A 11/04/2021   Procedure: COLONOSCOPY WITH PROPOFOL;  Surgeon: Jackquline Denmark, MD;  Location: [facility];  Service: Gastroenterology;  Laterality: N/A;   ERCP N/A 07/26/2018   Procedure: ENDOSCOPIC RETROGRADE CHOLANGIOPANCREATOGRAPHY (ERCP);  Surgeon: Ladene Artist, MD;  Location: [facility];  Service: Endoscopy;  Laterality: N/A;   ESOPHAGOGASTRODUODENOSCOPY (EGD) WITH PROPOFOL N/A 11/04/2021   Procedure: ESOPHAGOGASTRODUODENOSCOPY (EGD) WITH PROPOFOL;  Surgeon: Jackquline Denmark,  MD;  Location: [facility];  Service: Gastroenterology;  Laterality: N/A;   GANGLION CYST EXCISION Right    hand   HYSTERECTOMY ABDOMINAL WITH SALPINGO-OOPHORECTOMY  11/10/2014   ovarian cancer, tumor removal   INCISIONAL HERNIA REPAIR N/A 12/25/2020   Procedure: LAPAROSCOPIC INCISIONAL HERNIA REPAIR WITH MESH;  Surgeon: Kinsinger, Arta Bruce, MD;  Location: [facility];  Service: General;  Laterality: N/A;   IR GENERIC HISTORICAL  05/12/2016   IR REMOVAL TUN ACCESS W/ PORT W/O FL MOD SED 05/12/2016 [facility]   LAPAROSCOPIC ENDOMETRIOSIS FULGURATION     LIPOMA EXCISION Left    ankle   meniscal tear Right     MENISCUS REPAIR Right    POLYPECTOMY  11/04/2021   Procedure: POLYPECTOMY;  Surgeon: Jackquline Denmark, MD;  Location: [facility];  Service: Gastroenterology;;   Glori Luis a cath removal     PORTA CATH INSERTION     REMOVAL OF STONES  07/26/2018   Procedure: REMOVAL OF STONES;  Surgeon: Ladene Artist, MD;  Location: [facility];  Service: Endoscopy;;   SPHINCTEROTOMY  07/26/2018   Procedure: Joan Mayans;  Surgeon: Ladene Artist, MD;  Location: [facility];  Service: Endoscopy;;   UMBILICAL HERNIA REPAIR  12/29/2020   VIDEO BRONCHOSCOPY WITH ENDOBRONCHIAL ULTRASOUND N/A 12/16/2020   Procedure: VIDEO BRONCHOSCOPY WITH ENDOBRONCHIAL ULTRASOUND;  Surgeon: Rigoberto Noel, MD;  Location: [facility];  Service: Cardiopulmonary;  Laterality: N/A;    reports that she has never smoked. She has been exposed to tobacco smoke. She has never used smokeless tobacco. She reports that she does not currently use alcohol. She reports that she does not use drugs. family history includes Atrial fibrillation in her father; Dementia in her paternal grandfather and paternal grandmother; Diabetes in her maternal aunt and mother; Healthy in her brother; Heart attack in her maternal grandfather; Heart disease in her mother; Heart failure in her maternal grandmother; Hyperlipidemia in her mother; Hypertension in her mother; Osteoarthritis in her mother; Stroke in her father and mother. Allergies  Allergen Reactions   Moxifloxacin Anaphylaxis    needs epinephrine shot   Quinolones Anaphylaxis, Shortness Of Breath and Swelling   Doxycycline Nausea And Vomiting   Escitalopram Oxalate Other (See Comments)     fatigue   Lisinopril Cough   Sulfonamide Derivatives Swelling and Rash    needs epinephrine shot--rash and lip swelling   Current Outpatient Medications on File Prior to Visit  Medication Sig Dispense Refill   ALPRAZolam (XANAX) 1 MG tablet Take 1 tablet (1 mg total) by mouth 2 (two) times daily as needed  for anxiety. 60 tablet 2   benzonatate (TESSALON) 100 MG capsule Take by mouth 3 (three) times daily as needed for cough.     Blood Pressure Monitoring (OMRON 3 SERIES BP MONITOR) DEVI Use as directed. 1 each 0   clobetasol cream (TEMOVATE) 8.93 % Apply 1 Application topically 2 (two) times daily as needed. 45 g 0   cyclobenzaprine (FLEXERIL) 5 MG tablet Take 1 tablet (5 mg total) by mouth at bedtime as needed for muscle spasms. 90 tablet 1   estradiol (VIVELLE-DOT) 0.1 MG/24HR patch Place 1 patch (0.1 mg total) onto the skin 2 (two) times a week. 8 patch 0   fluticasone (FLONASE) 50 MCG/ACT nasal spray Place 2 sprays into both nostrils daily. In each nostril (Patient taking differently: Place 2 sprays into both nostrils daily as needed for allergies. In each nostril) 16 g 1   fluticasone-salmeterol (ADVAIR DISKUS) 250-50 MCG/ACT AEPB Inhale 1  puff into the lungs in the morning and at bedtime. 409 each 3   folic acid (FOLVITE) 1 MG tablet Take 2 tablets (2 mg total) by mouth daily. 180 tablet 3   gabapentin (NEURONTIN) 600 MG tablet Take 1 tablet (600 mg total) by mouth 3 (three) times daily. 270 tablet 1   Insulin Pen Needle (B-D UF III MINI PEN NEEDLES) 31G X 5 MM MISC Use once daily as directed 100 each 0   levothyroxine (SYNTHROID) 88 MCG tablet Take 1 tablet (88 mcg total) by mouth daily. 90 tablet 3   Liraglutide -Weight Management (SAXENDA) 18 MG/3ML SOPN Inject 0.6 mg into the skin daily. 15 mL 0   losartan (COZAAR) 25 MG tablet Take 1 tablet (25 mg total) by mouth daily. Please schedule an appointment. 90 tablet 1   methotrexate 50 MG/2ML injection Inject 0.8 mLs (20 mg total) into the skin once a week. (Patient taking differently: Inject 20 mg into the skin every Friday.) 10 mL 0   omeprazole (PRILOSEC) 40 MG capsule Take 1 capsule (40 mg total) by mouth daily. 30 capsule 3   polyvinyl alcohol (ARTIFICIAL TEARS) 1.4 % ophthalmic solution Place 1 drop into both eyes as needed for dry eyes.  15 mL 0   sertraline (ZOLOFT) 100 MG tablet TAKE 2 TABLETS (200 MG TOTAL) BY MOUTH DAILY. (Patient taking differently: Take 100 mg by mouth in the morning and at bedtime.) 180 tablet 3   traZODone (DESYREL) 50 MG tablet Take 0.5-1 tablets (25-50 mg total) by mouth at bedtime as needed for sleep. (Patient taking differently: Take 25-50 mg by mouth at bedtime.) 90 tablet 1   Vitamin D, Ergocalciferol, (DRISDOL) 1.25 MG (50000 UNIT) CAPS capsule Take 1 capsule (50,000 Units total) by mouth every 7 (seven) days. 5 capsule 0   Zoster Vaccine Adjuvanted [facility]) injection Inject into the muscle. 0.5 mL 0   No current facility-administered medications on file prior to visit.        ROS:  All others reviewed and negative.  Objective        PE:  BP 114/62 (BP Location: Right Arm, Patient Position: Sitting, Cuff Size: Large)   Pulse 78   Temp 98.6 F (37 C) (Oral)   Ht '5\' 7"'$  (1.702 m)   Wt (!) 322 lb 3.2 oz (146.1 kg)   LMP 11/02/2014   SpO2 97%   BMI 50.46 kg/m                 Constitutional: Pt appears in NAD               HENT: Head: NCAT.                Right Ear: External ear normal.                 Left Ear: External ear normal.                Eyes: . Pupils are equal, round, and reactive to light. Conjunctivae and EOM are normal               Nose: without d/c or deformity               Neck: Neck supple. Gross normal ROM               Cardiovascular: Normal rate and regular rhythm.                 Pulmonary/Chest: Effort  normal and breath sounds without rales or wheezing.                Abd:  Soft, NT, ND, + BS, no organomegaly               Neurological: Pt is alert. At baseline orientation, motor grossly intact               Skin: Skin is warm. No rashes, no other new lesions, LE edema - none               Psychiatric: Pt behavior is normal without agitation   Micro: none  Cardiac tracings I have personally interpreted today:  none  Pertinent Radiological findings  (summarize): none   Lab Results  Component Value Date   WBC 9.4 11/16/2021   HGB 13.5 11/16/2021   HCT 40.6 11/16/2021   PLT 280 11/16/2021   GLUCOSE 101 (H) 11/16/2021   CHOL 159 06/25/2021   TRIG 140.0 06/25/2021   HDL 38.90 (L) 06/25/2021   LDLCALC 92 06/25/2021   ALT 22 11/16/2021   AST 15 11/16/2021   NA 141 11/16/2021   K 4.3 11/16/2021   CL 101 11/16/2021   CREATININE 0.80 11/16/2021   BUN 9 11/16/2021   CO2 25 11/16/2021   TSH 5.360 (H) 11/16/2021   INR 0.90 05/12/2016   HGBA1C 5.8 (H) 11/16/2021   Assessment/Plan:  [name] is a 51 y.o. White or Caucasian [1] female with  has a past medical history of Anxiety, Arthritis, Asthma, Back pain, Barrett's esophagus, Chemotherapy-induced neuropathy ([place]), Chronic lower back pain, Depression, Dyspnea, Elevated blood pressure, situational (05/05/2016), Endometriosis, Exercise-induced asthma (09/08/2015), Fatigue, Gallbladder problem, GERD (gastroesophageal reflux disease), Heartburn, History of kidney stones, Hypertension, Hypothyroidism, Infertility, female, Joint pain, Joint stiffness, Lower extremity edema, Migraine, ovarian cancer, Ovarian cancer, bilateral ([place]) (11/28/2014), PONV (postoperative nausea and vomiting), Sarcoidosis, Sleep apnea, SOBOE (shortness of breath on exertion), Thyroid disease, Umbilical hernia, Vitamin D deficiency, and woke up while porta cath removed.  Depression With recent worsening, for wellbutrin XL 150 mg qd, decliens psychiatry referral or counseling for now  Hypothyroidism Lab Results  Component Value Date   TSH 5.360 (H) 11/16/2021   Uncontrolled, pt to continue levothyroxine at increased dose to 100 mcg qd   Hot flashes due to menopause Uncontrolled, to start estradiol 1 mg qd  ADD (attention deficit disorder) Uncontrolled, for start adderall XR 20 mg qd  Followup: Return in about 6 months (around 06/19/2022), or if symptoms worsen or fail to improve.  [name], MD 12/19/2021  6:07 PM [facility]

## 2021-12-19 ENCOUNTER — Encounter: Payer: Self-pay | Admitting: Internal Medicine

## 2021-12-19 DIAGNOSIS — F988 Other specified behavioral and emotional disorders with onset usually occurring in childhood and adolescence: Secondary | ICD-10-CM | POA: Insufficient documentation

## 2021-12-19 DIAGNOSIS — N951 Menopausal and female climacteric states: Secondary | ICD-10-CM | POA: Insufficient documentation

## 2021-12-19 NOTE — Assessment & Plan Note (Signed)
Uncontrolled, to start estradiol 1 mg qd

## 2021-12-19 NOTE — Assessment & Plan Note (Signed)
Lab Results  Component Value Date   TSH 5.360 (H) 11/16/2021   Uncontrolled, pt to continue levothyroxine at increased dose to 100 mcg qd

## 2021-12-19 NOTE — Assessment & Plan Note (Signed)
Uncontrolled, for start adderall XR 20 mg qd

## 2021-12-19 NOTE — Assessment & Plan Note (Signed)
With recent worsening, for wellbutrin XL 150 mg qd, decliens psychiatry referral or counseling for now

## 2021-12-20 ENCOUNTER — Other Ambulatory Visit (HOSPITAL_BASED_OUTPATIENT_CLINIC_OR_DEPARTMENT_OTHER): Payer: Self-pay

## 2021-12-21 ENCOUNTER — Other Ambulatory Visit (HOSPITAL_BASED_OUTPATIENT_CLINIC_OR_DEPARTMENT_OTHER): Payer: Self-pay

## 2021-12-21 ENCOUNTER — Ambulatory Visit (INDEPENDENT_AMBULATORY_CARE_PROVIDER_SITE_OTHER): Payer: 59 | Admitting: Family Medicine

## 2021-12-21 ENCOUNTER — Encounter (INDEPENDENT_AMBULATORY_CARE_PROVIDER_SITE_OTHER): Payer: Self-pay | Admitting: Family Medicine

## 2021-12-21 VITALS — BP 117/78 | HR 82 | Temp 98.0°F | Ht 67.0 in | Wt 316.0 lb

## 2021-12-21 DIAGNOSIS — F908 Attention-deficit hyperactivity disorder, other type: Secondary | ICD-10-CM | POA: Diagnosis not present

## 2021-12-21 DIAGNOSIS — F3289 Other specified depressive episodes: Secondary | ICD-10-CM

## 2021-12-21 DIAGNOSIS — E669 Obesity, unspecified: Secondary | ICD-10-CM

## 2021-12-21 DIAGNOSIS — Z6841 Body Mass Index (BMI) 40.0 and over, adult: Secondary | ICD-10-CM

## 2021-12-21 DIAGNOSIS — E038 Other specified hypothyroidism: Secondary | ICD-10-CM

## 2021-12-21 MED ORDER — SAXENDA 18 MG/3ML ~~LOC~~ SOPN
1.2000 mg | PEN_INJECTOR | Freq: Every day | SUBCUTANEOUS | 0 refills | Status: DC
  Filled 2021-12-21: qty 15, 75d supply, fill #0

## 2021-12-21 MED ORDER — BD PEN NEEDLE MINI U/F 31G X 5 MM MISC
0 refills | Status: DC
  Filled 2021-12-21: qty 100, fill #0

## 2021-12-23 ENCOUNTER — Ambulatory Visit (HOSPITAL_BASED_OUTPATIENT_CLINIC_OR_DEPARTMENT_OTHER): Payer: 59 | Admitting: Physical Therapy

## 2021-12-23 ENCOUNTER — Encounter (HOSPITAL_BASED_OUTPATIENT_CLINIC_OR_DEPARTMENT_OTHER): Payer: Self-pay | Admitting: Physical Therapy

## 2021-12-23 DIAGNOSIS — R2689 Other abnormalities of gait and mobility: Secondary | ICD-10-CM

## 2021-12-23 DIAGNOSIS — M25672 Stiffness of left ankle, not elsewhere classified: Secondary | ICD-10-CM

## 2021-12-23 DIAGNOSIS — M21612 Bunion of left foot: Secondary | ICD-10-CM | POA: Diagnosis not present

## 2021-12-23 DIAGNOSIS — M545 Low back pain, unspecified: Secondary | ICD-10-CM | POA: Diagnosis not present

## 2021-12-23 DIAGNOSIS — R269 Unspecified abnormalities of gait and mobility: Secondary | ICD-10-CM | POA: Diagnosis not present

## 2021-12-23 DIAGNOSIS — M25572 Pain in left ankle and joints of left foot: Secondary | ICD-10-CM

## 2021-12-23 DIAGNOSIS — M25372 Other instability, left ankle: Secondary | ICD-10-CM | POA: Diagnosis not present

## 2021-12-23 DIAGNOSIS — M25571 Pain in right ankle and joints of right foot: Secondary | ICD-10-CM | POA: Diagnosis not present

## 2021-12-23 DIAGNOSIS — R262 Difficulty in walking, not elsewhere classified: Secondary | ICD-10-CM | POA: Diagnosis not present

## 2021-12-23 NOTE — Therapy (Signed)
OUTPATIENT PHYSICAL THERAPY LOWER EXTREMITY TREATMENT NOTE   Patient Name: [name] MRN: [id number] DOB:December 26, 1970, 51 y.o., female Today's Date: 12/23/2021   PT End of Session - 12/23/21 0907     Visit Number 3    Number of Visits 12    Date for PT Re-Evaluation 01/18/22    PT Start Time 0900    PT Stop Time 0945    PT Time Calculation (min) 45 min             Past Medical History:  Diagnosis Date   Anxiety    Arthritis    Asthma    Back pain    Barrett's esophagus    Chemotherapy-induced neuropathy (HCC)    Chronic lower back pain    Depression    Dyspnea    Elevated blood pressure, situational 05/05/2016   Endometriosis    Exercise-induced asthma 09/08/2015   Fatigue    Gallbladder problem    GERD (gastroesophageal reflux disease)    Heartburn    History of kidney stones    Hypertension    Hypothyroidism    Infertility, female    Joint pain    Joint stiffness    Lower extremity edema    Migraine    ovarian cancer    Ovarian cancer, bilateral ([place]) 11/28/2014   PONV (postoperative nausea and vomiting)    Sarcoidosis    Sleep apnea    SOBOE (shortness of breath on exertion)    Thyroid disease    Umbilical hernia    Vitamin D deficiency    woke up while porta cath removed    Past Surgical History:  Procedure Laterality Date   ABDOMINAL HYSTERECTOMY  11/10/2014   at [facility], Exp lap, supracervical hyst, BSO, infracolic omentectomy, lymphadenectomy, aortic lymph node sampling   BIOPSY  11/04/2021   Procedure: BIOPSY;  Surgeon: Jackquline Denmark, MD;  Location: [facility];  Service: Gastroenterology;;   BRONCHIAL NEEDLE ASPIRATION BIOPSY  12/16/2020   Procedure: BRONCHIAL NEEDLE ASPIRATION BIOPSIES;  Surgeon: Rigoberto Noel, MD;  Location: [facility] ENDOSCOPY;  Service: Cardiopulmonary;;   BRONCHIAL WASHINGS  12/16/2020   Procedure: BRONCHIAL WASHINGS;  Surgeon: Rigoberto Noel, MD;  Location: [facility];  Service: Cardiopulmonary;;   BUNIONECTOMY Left  09/14/2021   CHOLECYSTECTOMY N/A 07/24/2018   Procedure: LAPAROSCOPIC CHOLECYSTECTOMY WITH INTRAOPERATIVE CHOLANGIOGRAM;  Surgeon: Armandina Gemma, MD;  Location: [facility];  Service: General;  Laterality: N/A;   COLONOSCOPY WITH PROPOFOL N/A 11/04/2021   Procedure: COLONOSCOPY WITH PROPOFOL;  Surgeon: Jackquline Denmark, MD;  Location: [facility];  Service: Gastroenterology;  Laterality: N/A;   ERCP N/A 07/26/2018   Procedure: ENDOSCOPIC RETROGRADE CHOLANGIOPANCREATOGRAPHY (ERCP);  Surgeon: Ladene Artist, MD;  Location: [facility];  Service: Endoscopy;  Laterality: N/A;   ESOPHAGOGASTRODUODENOSCOPY (EGD) WITH PROPOFOL N/A 11/04/2021   Procedure: ESOPHAGOGASTRODUODENOSCOPY (EGD) WITH PROPOFOL;  Surgeon: Jackquline Denmark, MD;  Location: [facility];  Service: Gastroenterology;  Laterality: N/A;   GANGLION CYST EXCISION Right    hand   HYSTERECTOMY ABDOMINAL WITH SALPINGO-OOPHORECTOMY  11/10/2014   ovarian cancer, tumor removal   INCISIONAL HERNIA REPAIR N/A 12/25/2020   Procedure: LAPAROSCOPIC INCISIONAL HERNIA REPAIR WITH MESH;  Surgeon: Kinsinger, Arta Bruce, MD;  Location: [facility];  Service: General;  Laterality: N/A;   IR GENERIC HISTORICAL  05/12/2016   IR REMOVAL TUN ACCESS W/ PORT W/O FL MOD SED 05/12/2016 [facility]-INTER[facility]   LAPAROSCOPIC ENDOMETRIOSIS FULGURATION     LIPOMA EXCISION Left    ankle   meniscal tear Right  MENISCUS REPAIR Right    POLYPECTOMY  11/04/2021   Procedure: POLYPECTOMY;  Surgeon: Jackquline Denmark, MD;  Location: [facility];  Service: Gastroenterology;;   Glori Luis a cath removal     PORTA CATH INSERTION     REMOVAL OF STONES  07/26/2018   Procedure: REMOVAL OF STONES;  Surgeon: Ladene Artist, MD;  Location: [facility];  Service: Endoscopy;;   SPHINCTEROTOMY  07/26/2018   Procedure: Joan Mayans;  Surgeon: Ladene Artist, MD;  Location: [facility];  Service: Endoscopy;;   UMBILICAL HERNIA REPAIR  12/29/2020   VIDEO BRONCHOSCOPY WITH ENDOBRONCHIAL ULTRASOUND N/A  12/16/2020   Procedure: VIDEO BRONCHOSCOPY WITH ENDOBRONCHIAL ULTRASOUND;  Surgeon: Rigoberto Noel, MD;  Location: [facility];  Service: Cardiopulmonary;  Laterality: N/A;   Patient Active Problem List   Diagnosis Date Noted   Hot flashes due to menopause 12/19/2021   ADD (attention deficit disorder) 12/19/2021   SOBOE (shortness of breath on exertion) 11/16/2021   Elevated glucose 11/16/2021   Vitamin D deficiency 11/16/2021   Prediabetes 11/16/2021   Bunion, left 08/06/2021   Morbid obesity ([place]) 06/26/2021   Venous insufficiency 06/25/2021   Incisional hernia 12/25/2020   Mediastinal lymphadenopathy    Pulmonary nodules 10/20/2020   Multiple skin nodules 07/24/2020   Viral illness 06/14/2020   Right lumbar radiculopathy 50/53/9767   Periumbilical hernia 34/19/3790   TB lung, latent 05/18/2020   Acute non-recurrent maxillary sinusitis 11/09/2019   HTN (hypertension) 04/14/2019   Mass of left wrist 04/14/2019   External otitis of right ear 04/14/2019   Choledocholithiasis    Abnormal cholangiogram    Cholecystitis with cholelithiasis 07/24/2018   Hyperglycemia 10/16/2017   Hot flash not due to menopause 10/16/2017   Eczema 10/16/2017   Flu-like symptoms 06/20/2017   Cognitive changes 01/30/2017   Disturbed concentration 01/26/2017   Left shoulder pain 11/01/2016   Right low back pain 11/01/2016   Elevated blood pressure, situational 05/05/2016   Surgical menopause 01/29/2016   OSA on CPAP 12/22/2015   Cough 11/26/2015   Capsulitis 10/06/2015   Ganglion cyst of left foot 10/06/2015   Fatigue 09/13/2015   Asthma 09/08/2015   Exertional dyspnea 09/08/2015   Peroneal ganglion cyst 07/20/2015   Low back pain 07/20/2015   Peroneal tendinitis of left lower leg 06/25/2015   Nonallopathic lesion of cervical region 06/25/2015   Wheezing 05/08/2015   Polyarthralgia 05/08/2015   Peripheral edema 03/16/2015   Central line complication 24/01/7352   Morbid obesity with BMI  of 45.0-49.9, adult ([place]) 02/28/2015   Chemotherapy induced neutropenia ([place]) 02/28/2015   Screening for colorectal cancer 01/05/2015   Chemotherapy induced nausea and vomiting 01/02/2015   Chemotherapy-induced peripheral neuropathy ([place]) 01/02/2015   Premature surgical menopause 12/17/2014   Leukopenia due to antineoplastic chemotherapy ([place]) 12/17/2014   Port-A-Cath in place 12/17/2014   Encounter for antineoplastic chemotherapy 12/17/2014   Myalgia 12/09/2014   Hypersensitivity reaction 12/05/2014   Poor venous access 11/28/2014   Postoperative cellulitis of surgical wound 11/24/2014   Tobacco dependence 11/12/2014   Malignant neoplasm of both ovaries 11/10/2014   Anxiety 11/10/2014   Adnexal mass 11/10/2014   Encounter for well adult exam with abnormal findings 09/10/2014   Hypothyroidism 09/10/2014   Hypersomnolence 09/10/2014   Acute non-recurrent frontal sinusitis 06/24/2014   Right knee pain 06/24/2014   Lower back pain 01/08/2014   EAR PAIN, BILATERAL 12/18/2009   KNEE PAIN, RIGHT 12/18/2009   INGROWN TOENAIL 12/17/2008   BACK PAIN 12/17/2008   Acute bronchospasm 11/11/2008  Depression 11/29/2007   Insomnia 11/29/2007   Allergic rhinitis 04/13/2007   GERD 04/13/2007   Endometriosis determined by laparoscopy 04/13/2007   Migraine headache 01/29/2007    PCP: Dr Ina Kick   REFERRING PROVIDER: Dr Annice Needy   REFERRING DIAG: Left foot bunionectomy ( Great Toe)   THERAPY DIAG:  Other abnormalities of gait and mobility  Pain in left ankle and joints of left foot  Stiffness of left ankle, not elsewhere classified  Rationale for Evaluation and Treatment Rehabilitation  ONSET DATE: 09/13/2021  SUBJECTIVE:   SUBJECTIVE STATEMENT: Pt reports the [place] tape stayed well and helped.  She tried applying KT tape, but it rolled off right away.  She states her mobility is improving.  She did a lot of walking at [facility] this week, moving her son in on campus.    PERTINENT HISTORY: Anxiety, right knee arthritis; back pain, depression, dyspnea, chemo therapy induced neuropathy; LE edema, ovarian cancer ;   PAIN:  Are you having pain? yes NPRS scale: 3/10  Pain location: left foot on top of the great toe and prox incision area Pain description: aching and burning  Aggravating factors: standing and walking  Relieving factors: not putting pressure on the foot   PRECAUTIONS: None  WEIGHT BEARING RESTRICTIONS No  FALLS:  Has patient fallen in last 6 months? No  LIVING ENVIRONMENT: Steps in the house.  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   PLOF: Independent  PATIENT GOALS : to have less pain/ to walk better/ To get back into the gym if able    OBJECTIVE:   DIAGNOSTIC FINDINGS:  X-rays: Nothing significant   PATIENT SURVEYS:  FOTO    COGNITION:  Overall cognitive status: Within functional limits for tasks assessed     SENSATION: Baseline neuropathy from chemo in 2016   EDEMA:  Swelling when she is up and on it  eg  POSTURE: No Significant postural limitations  PALPATION:   LOWER EXTREMITY ROM:  Active ROM Right eval Left eval  Hip flexion    Hip extension    Hip abduction    Hip adduction    Hip internal rotation    Hip external rotation    Knee flexion    Knee extension    Ankle dorsiflexion 0 13  Ankle plantarflexion 40   Ankle inversion WNL   Ankle eversion WNL     (Blank rows = not tested)  PROM  Right eval Left eval  Hip flexion    Hip extension    Hip abduction    Hip adduction    Hip internal rotation    Hip external rotation    Knee flexion    Knee extension    Ankle dorsiflexion 5 pulling on the bottom of the foot noted  15  Ankle plantarflexion WNL   Ankle inversion WNL   Ankle eversion WNL     (Blank rows = not tested)  LOWER EXTREMITY MMT:  MMT Right eval Left eval  Hip flexion 33.0 25.6  Hip extension    Hip abduction 32.6 35.5  Hip adduction    Hip internal  rotation    Hip external rotation    Knee flexion    Knee extension 17.4 39.5  Ankle dorsiflexion    Ankle plantarflexion    Ankle inversion    Ankle eversion     (Blank rows = not tested)  FUNCTIONAL TESTS:  Shifts away from right foot with sit to stand transfer  GAIT:  Significant lateral movement away form the great toe.Can see foot pressing into the lateral portion of the shoe. Decreased weight bearing on the left     TODAY'S TREATMENT: Pt seen for aquatic therapy today.  Treatment took place in water 3.25-4.5 ft in depth at the [facility]. Temp of water was 91.  Pt entered/exited the pool via stairs independently with bilat rail.  * forward walk with cues for heel strike and roll through * weight shifts forward- working on rolling through toes, then flat foot to DF * backwards walk, rolling through feet * side stepping  * high knee marching with PF when knee flexed (somewhat painful at navicular) * ankle circles; return to walking forward/backward * Heel/toe raises holding wall (pain at top of R foot) * SLS  * side to side lunge holding wall  * return to walking, relaxed form * straddling yellow noodle:  bicycle motion; jumping jack motion * toe walking (tiny steps) holding wall, to L- painful, stopped * Holding rainbow hand buoys- squats x 10; SLS with opp foot toe taps x 5 x 2 * L forward step ups in 4 ft water x 8,  R forward step downs/L retro step ups x 5 with UE on wall   Once dried off: applied [place] tape to L medial foot with 20% stretch (metatarsal head to just past navicular) and perpendicular strip applied over navicular - to decompress tissue, aid in desensitization and increase proprioception   Pt requires the buoyancy and hydrostatic pressure of water for support, and to offload joints by unweighting joint load by at least 50 % in naval deep water and by at least 75-80% in chest to neck deep water.  Viscosity of the water is needed for  resistance of strengthening. Water current perturbations provides challenge to standing balance requiring increased core activation.     PATIENT EDUCATION:  Education details: HEP; benefits of aquatics  Person educated: Patient Education method: Explanation, Demonstration, Tactile cues, Verbal cues, and Handouts Education comprehension: verbalized understanding, returned demonstration, verbal cues required, tactile cues required, and needs further education   HOME EXERCISE PROGRAM: Access Code: [email or web address] URL: [email or web address] Date: 12/08/2021 Prepared by: Carolyne Littles  Exercises - Ankle Dorsiflexion with Resistance  - 1 x daily - 7 x weekly - 3 sets - 10 reps - Ankle and Toe Plantarflexion with Resistance  - 1 x daily - 7 x weekly - 3 sets - 10 reps - Long Sitting Calf Stretch with Strap  - 3 x daily - 7 x weekly - 1 sets - 3 reps - 20 hold - Seated Self Great Toe Mobilization  - 1 x daily - 7 x weekly - 3 sets - 10 reps   ASSESSMENT:  CLINICAL IMPRESSION:   She reported some increase in tightness along medial instep of foot with PF during high knee marching, reduced with change in exercise.  All exercises performed in water at sternum level. No sensitivity across top of foot when pedaling legs like bicycle while suspended in deeper water, as she did last session. Pt able to tolerate increased volume of exercises this visit, with minimal increase in pain. Goals are ongoing.    OBJECTIVE IMPAIRMENTS Abnormal gait, decreased activity tolerance, decreased endurance, decreased mobility, difficulty walking, decreased ROM, decreased strength, increased edema, and pain.   ACTIVITY LIMITATIONS lifting, bending, standing, squatting, stairs, transfers, and locomotion level  PARTICIPATION LIMITATIONS: meal prep, cleaning, shopping, community activity, occupation, and yard work  PERSONAL FACTORS Fitness and  1-2 comorbidities: low back pain and teral knee pain   are also  affecting patient's functional outcome.   REHAB POTENTIAL: Good  CLINICAL DECISION MAKING: Evolving/moderate complexity  EVALUATION COMPLEXITY: High   GOALS: Goals reviewed with patient? Yes  SHORT TERM GOALS: Target date: 01/20/2022  Patient will increase active DF by 5 degrees  Baseline: Goal status: INITIAL  2.  Patient will demonstrate reduced lateral movement away from the great toe ambualting  Baseline:  Goal status: INITIAL  3.  Patient will increase gross right LE strength by 5 lbs  Baseline:  Goal status: INITIAL  LONG TERM GOALS: Target date: 02/17/2022   Patient will stand for 1/2 hour without pain in order to perform ADL's  Baseline:  Goal status: INITIAL  2.  Patient will ambulate 3000' without pain in order to go grocery shopping without pain  Baseline:  Goal status: INITIAL  3.  Patient will go up and down 6 steps without pain  Baseline:  Goal status: INITIAL    PLAN: PT FREQUENCY: 2x/week  PT DURATION: 8 weeks  PLANNED INTERVENTIONS: Therapeutic exercises, Therapeutic activity, Neuromuscular re-education, Balance training, Gait training, Patient/Family education, Self Care, Joint mobilization, Aquatic Therapy, Dry Needling, Cryotherapy, Moist heat, Ultrasound, Ionotophoresis '4mg'$ /ml Dexamethasone, Manual therapy, and Re-evaluation  PLAN FOR NEXT SESSION: in the water work on normalizing gait; improving ankle strength, improve gross strength of the left leg; improve ability to weight bear.   Kerin Perna, PTA 12/23/21 10:02 AM

## 2021-12-24 ENCOUNTER — Ambulatory Visit (INDEPENDENT_AMBULATORY_CARE_PROVIDER_SITE_OTHER): Payer: 59

## 2021-12-24 ENCOUNTER — Ambulatory Visit (INDEPENDENT_AMBULATORY_CARE_PROVIDER_SITE_OTHER): Payer: 59 | Admitting: Podiatry

## 2021-12-24 DIAGNOSIS — M21612 Bunion of left foot: Secondary | ICD-10-CM | POA: Diagnosis not present

## 2021-12-24 DIAGNOSIS — Z9889 Other specified postprocedural states: Secondary | ICD-10-CM

## 2021-12-27 ENCOUNTER — Ambulatory Visit (HOSPITAL_BASED_OUTPATIENT_CLINIC_OR_DEPARTMENT_OTHER): Payer: 59 | Admitting: Physical Therapy

## 2021-12-27 ENCOUNTER — Encounter (HOSPITAL_BASED_OUTPATIENT_CLINIC_OR_DEPARTMENT_OTHER): Payer: Self-pay | Admitting: Physical Therapy

## 2021-12-27 DIAGNOSIS — M25372 Other instability, left ankle: Secondary | ICD-10-CM | POA: Diagnosis not present

## 2021-12-27 DIAGNOSIS — R2689 Other abnormalities of gait and mobility: Secondary | ICD-10-CM

## 2021-12-27 DIAGNOSIS — M21612 Bunion of left foot: Secondary | ICD-10-CM | POA: Diagnosis not present

## 2021-12-27 DIAGNOSIS — M545 Low back pain, unspecified: Secondary | ICD-10-CM | POA: Diagnosis not present

## 2021-12-27 DIAGNOSIS — R269 Unspecified abnormalities of gait and mobility: Secondary | ICD-10-CM | POA: Diagnosis not present

## 2021-12-27 DIAGNOSIS — R262 Difficulty in walking, not elsewhere classified: Secondary | ICD-10-CM | POA: Diagnosis not present

## 2021-12-27 DIAGNOSIS — M25672 Stiffness of left ankle, not elsewhere classified: Secondary | ICD-10-CM

## 2021-12-27 DIAGNOSIS — M25572 Pain in left ankle and joints of left foot: Secondary | ICD-10-CM

## 2021-12-27 DIAGNOSIS — M25571 Pain in right ankle and joints of right foot: Secondary | ICD-10-CM | POA: Diagnosis not present

## 2021-12-27 NOTE — Progress Notes (Signed)
Subjective: Chief Complaint  Patient presents with   Routine Post Op    Rm 12 POV #5 DOS 09/08/2021 LT FOOT SURGICAL CORRECTION OF BUNION, LAPIDUS POSS AIKEN. Pt states with activity there is tightness at the arch and pain at the joint of 1 toe. But overall pt feels a lot better than her last visit.     [name] is a 51 y.o. is seen today in office s/p left foot Lapidus bunionectomy preformed on 09/08/2021.  Michela Pitcher that she is doing better.  Still with some soreness.  She is using KT tape from physical therapy.  No recent injury or changes.  She is back to her regular shoe.  No other concerns today.    Objective: General: No acute distress, AAOx3  DP/PT pulses palpable 2/4, CRT < 3 sec to all digits.  Protective sensation intact. Motor function intact.  Left foot: Incision is well coapted without any evidence of dehiscence.  Scar is formed.  There is some some slight edema but overall improved.  There is no erythema or warmth.  There is improved range of motion of the first MPJ and this is much improved compared to last appointment.  Seen some pain along the sesamoids plantarly. No pain with calf compression, swelling, warmth, erythema.   Assessment and Plan:  Status post left foot Lapidus bunionectomy  -Treatment options discussed including all alternatives, risks, and complications -X-rays obtained reviewed.  Hardware intact any complicating factors.  Increased consolidation noted across the arthrodesis site.  No evidence of acute fracture. -Continue with supportive shoe gear and physical therapy.  I showed her how to apply the KT tape. -Continue ice, elevation as well as compression wrap with any postoperative edema. -Gradual increase activity level as tolerated.  Return in about 6 weeks (around 02/04/2022).  Trula Slade DPM

## 2021-12-27 NOTE — Therapy (Unsigned)
OUTPATIENT PHYSICAL THERAPY LOWER EXTREMITY TREATMENT NOTE   Patient Name: [name] MRN: [id number] DOB:Jun 23, 1970, 51 y.o., female Today's Date: 12/28/2021   PT End of Session - 12/27/21 1643     Visit Number 4    Number of Visits 12    Date for PT Re-Evaluation 01/18/22    PT Start Time 2025    PT Stop Time 4270    PT Time Calculation (min) 40 min    Activity Tolerance Patient tolerated treatment well    Behavior During Therapy [facility] for tasks assessed/performed              Past Medical History:  Diagnosis Date   Anxiety    Arthritis    Asthma    Back pain    Barrett's esophagus    Chemotherapy-induced neuropathy (HCC)    Chronic lower back pain    Depression    Dyspnea    Elevated blood pressure, situational 05/05/2016   Endometriosis    Exercise-induced asthma 09/08/2015   Fatigue    Gallbladder problem    GERD (gastroesophageal reflux disease)    Heartburn    History of kidney stones    Hypertension    Hypothyroidism    Infertility, female    Joint pain    Joint stiffness    Lower extremity edema    Migraine    ovarian cancer    Ovarian cancer, bilateral ([place]) 11/28/2014   PONV (postoperative nausea and vomiting)    Sarcoidosis    Sleep apnea    SOBOE (shortness of breath on exertion)    Thyroid disease    Umbilical hernia    Vitamin D deficiency    woke up while porta cath removed    Past Surgical History:  Procedure Laterality Date   ABDOMINAL HYSTERECTOMY  11/10/2014   at [facility], Exp lap, supracervical hyst, BSO, infracolic omentectomy, lymphadenectomy, aortic lymph node sampling   BIOPSY  11/04/2021   Procedure: BIOPSY;  Surgeon: Jackquline Denmark, MD;  Location: [facility];  Service: Gastroenterology;;   BRONCHIAL NEEDLE ASPIRATION BIOPSY  12/16/2020   Procedure: BRONCHIAL NEEDLE ASPIRATION BIOPSIES;  Surgeon: Rigoberto Noel, MD;  Location: [facility] ENDOSCOPY;  Service: Cardiopulmonary;;   BRONCHIAL WASHINGS  12/16/2020   Procedure: BRONCHIAL  WASHINGS;  Surgeon: Rigoberto Noel, MD;  Location: [facility];  Service: Cardiopulmonary;;   BUNIONECTOMY Left 09/14/2021   CHOLECYSTECTOMY N/A 07/24/2018   Procedure: LAPAROSCOPIC CHOLECYSTECTOMY WITH INTRAOPERATIVE CHOLANGIOGRAM;  Surgeon: Armandina Gemma, MD;  Location: [facility] OR[facility];  Service: General;  Laterality: N/A;   COLONOSCOPY WITH PROPOFOL N/A 11/04/2021   Procedure: COLONOSCOPY WITH PROPOFOL;  Surgeon: Jackquline Denmark, MD;  Location: [facility] ENDOS[facility];  Service: Gastroenterology;  Laterality: N/A;   ERCP N/A 07/26/2018   Procedure: ENDOSCOPIC RETROGRADE CHOLANGIOPANCREATOGRAPHY (ERCP);  Surgeon: Ladene Artist, MD;  Location: [facility];  Service: Endoscopy;  Laterality: N/A;   ESOPHAGOGASTRODUODENOSCOPY (EGD) WITH PROPOFOL N/A 11/04/2021   Procedure: ESOPHAGOGASTRODUODENOSCOPY (EGD) WITH PROPOFOL;  Surgeon: Jackquline Denmark, MD;  Location: [facility];  Service: Gastroenterology;  Laterality: N/A;   GANGLION CYST EXCISION Right    hand   HYSTERECTOMY ABDOMINAL WITH SALPINGO-OOPHORECTOMY  11/10/2014   ovarian cancer, tumor removal   INCISIONAL HERNIA REPAIR N/A 12/25/2020   Procedure: LAPAROSCOPIC INCISIONAL HERNIA REPAIR WITH MESH;  Surgeon: Kinsinger, Arta Bruce, MD;  Location: [facility];  Service: General;  Laterality: N/A;   IR GENERIC HISTORICAL  05/12/2016   IR REMOVAL TUN ACCESS W/ PORT W/O FL MOD SED 05/12/2016 [facility]-INTER[facility] RAD  LAPAROSCOPIC ENDOMETRIOSIS FULGURATION     LIPOMA EXCISION Left    ankle   meniscal tear Right    MENISCUS REPAIR Right    POLYPECTOMY  11/04/2021   Procedure: POLYPECTOMY;  Surgeon: Jackquline Denmark, MD;  Location: [facility];  Service: Gastroenterology;;   Glori Luis a cath removal     PORTA CATH INSERTION     REMOVAL OF STONES  07/26/2018   Procedure: REMOVAL OF STONES;  Surgeon: Ladene Artist, MD;  Location[facility];  Service: Endoscopy;;   SPHINCTEROTOMY  07/26/2018   Procedure: Joan Mayans;  Surgeon: Ladene Artist, MD;  Location[facility];   Service: Endoscopy;;   UMBILICAL HERNIA REPAIR  12/29/2020   VIDEO BRONCHOSCOPY WITH ENDOBRONCHIAL ULTRASOUND N/A 12/16/2020   Procedure: VIDEO BRONCHOSCOPY WITH ENDOBRONCHIAL ULTRASOUND;  Surgeon: Rigoberto Noel, MD;  Location: [facility];  Service: Cardiopulmonary;  Laterality: N/A;   Patient Active Problem List   Diagnosis Date Noted   Hot flashes due to menopause 12/19/2021   ADD (attention deficit disorder) 12/19/2021   SOBOE (shortness of breath on exertion) 11/16/2021   Elevated glucose 11/16/2021   Vitamin D deficiency 11/16/2021   Prediabetes 11/16/2021   Bunion, left 08/06/2021   Morbid obesity ([place]) 06/26/2021   Venous insufficiency 06/25/2021   Incisional hernia 12/25/2020   Mediastinal lymphadenopathy    Pulmonary nodules 10/20/2020   Multiple skin nodules 07/24/2020   Viral illness 06/14/2020   Right lumbar radiculopathy 67/20/9470   Periumbilical hernia 96/28/3662   TB lung, latent 05/18/2020   Acute non-recurrent maxillary sinusitis 11/09/2019   HTN (hypertension) 04/14/2019   Mass of left wrist 04/14/2019   External otitis of right ear 04/14/2019   Choledocholithiasis    Abnormal cholangiogram    Cholecystitis with cholelithiasis 07/24/2018   Hyperglycemia 10/16/2017   Hot flash not due to menopause 10/16/2017   Eczema 10/16/2017   Flu-like symptoms 06/20/2017   Cognitive changes 01/30/2017   Disturbed concentration 01/26/2017   Left shoulder pain 11/01/2016   Right low back pain 11/01/2016   Elevated blood pressure, situational 05/05/2016   Surgical menopause 01/29/2016   OSA on CPAP 12/22/2015   Cough 11/26/2015   Capsulitis 10/06/2015   Ganglion cyst of left foot 10/06/2015   Fatigue 09/13/2015   Asthma 09/08/2015   Exertional dyspnea 09/08/2015   Peroneal ganglion cyst 07/20/2015   Low back pain 07/20/2015   Peroneal tendinitis of left lower leg 06/25/2015   Nonallopathic lesion of cervical region 06/25/2015   Wheezing 05/08/2015    Polyarthralgia 05/08/2015   Peripheral edema 03/16/2015   Central line complication 94/76/5465   Morbid obesity with BMI of 45.0-49.9, adult ([place]) 02/28/2015   Chemotherapy induced neutropenia ([place]) 02/28/2015   Screening for colorectal cancer 01/05/2015   Chemotherapy induced nausea and vomiting 01/02/2015   Chemotherapy-induced peripheral neuropathy ([place]) 01/02/2015   Premature surgical menopause 12/17/2014   Leukopenia due to antineoplastic chemotherapy ([place]) 12/17/2014   Port-A-Cath in place 12/17/2014   Encounter for antineoplastic chemotherapy 12/17/2014   Myalgia 12/09/2014   Hypersensitivity reaction 12/05/2014   Poor venous access 11/28/2014   Postoperative cellulitis of surgical wound 11/24/2014   Tobacco dependence 11/12/2014   Malignant neoplasm of both ovaries 11/10/2014   Anxiety 11/10/2014   Adnexal mass 11/10/2014   Encounter for well adult exam with abnormal findings 09/10/2014   Hypothyroidism 09/10/2014   Hypersomnolence 09/10/2014   Acute non-recurrent frontal sinusitis 06/24/2014   Right knee pain 06/24/2014   Lower back pain 01/08/2014   EAR PAIN, BILATERAL 12/18/2009  KNEE PAIN, RIGHT 12/18/2009   INGROWN TOENAIL 12/17/2008   BACK PAIN 12/17/2008   Acute bronchospasm 11/11/2008   Depression 11/29/2007   Insomnia 11/29/2007   Allergic rhinitis 04/13/2007   GERD 04/13/2007   Endometriosis determined by laparoscopy 04/13/2007   Migraine headache 01/29/2007    PCP: Dr Ina Kick   REFERRING PROVIDER: Dr Annice Needy   REFERRING DIAG: Left foot bunionectomy ( Great Toe)   THERAPY DIAG:  Other abnormalities of gait and mobility  Pain in left ankle and joints of left foot  Stiffness of left ankle, not elsewhere classified  Rationale for Evaluation and Treatment Rehabilitation  ONSET DATE: 09/13/2021  SUBJECTIVE:   SUBJECTIVE STATEMENT: Pt reports the [place] tape stayed well and helped.  She tried applying KT tape, but it rolled off right  away.  She states her mobility is improving.  She did a lot of walking at [facility] this week, moving her son in on campus.   PERTINENT HISTORY: Anxiety, right knee arthritis; back pain, depression, dyspnea, chemo therapy induced neuropathy; LE edema, ovarian cancer ;   PAIN:  Are you having pain? yes NPRS scale: 3/10  Pain location: left foot on top of the great toe and prox incision area Pain description: aching and burning  Aggravating factors: standing and walking  Relieving factors: not putting pressure on the foot   PRECAUTIONS: None  WEIGHT BEARING RESTRICTIONS No  FALLS:  Has patient fallen in last 6 months? No  LIVING ENVIRONMENT: Steps in the house.  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   PLOF: Independent  PATIENT GOALS : to have less pain/ to walk better/ To get back into the gym if able    OBJECTIVE:   DIAGNOSTIC FINDINGS:  X-rays: Nothing significant   PATIENT SURVEYS:  FOTO    COGNITION:  Overall cognitive status: Within functional limits for tasks assessed     SENSATION: Baseline neuropathy from chemo in 2016   EDEMA:  Swelling when she is up and on it  eg  POSTURE: No Significant postural limitations  PALPATION:   LOWER EXTREMITY ROM:  Active ROM Right eval Left eval  Hip flexion    Hip extension    Hip abduction    Hip adduction    Hip internal rotation    Hip external rotation    Knee flexion    Knee extension    Ankle dorsiflexion 0 13  Ankle plantarflexion 40   Ankle inversion WNL   Ankle eversion WNL     (Blank rows = not tested)  PROM  Right eval Left eval  Hip flexion    Hip extension    Hip abduction    Hip adduction    Hip internal rotation    Hip external rotation    Knee flexion    Knee extension    Ankle dorsiflexion 5 pulling on the bottom of the foot noted  15  Ankle plantarflexion WNL   Ankle inversion WNL   Ankle eversion WNL     (Blank rows = not tested)  LOWER EXTREMITY MMT:  MMT  Right eval Left eval  Hip flexion 33.0 25.6  Hip extension    Hip abduction 32.6 35.5  Hip adduction    Hip internal rotation    Hip external rotation    Knee flexion    Knee extension 17.4 39.5  Ankle dorsiflexion    Ankle plantarflexion    Ankle inversion    Ankle eversion     (Blank  rows = not tested)  FUNCTIONAL TESTS:  Shifts away from right foot with sit to stand transfer  GAIT:  Significant lateral movement away form the great toe.Can see foot pressing into the lateral portion of the shoe. Decreased weight bearing on the left     TODAY'S TREATMENT: 8/21 Pt seen for aquatic therapy today.  Treatment took place in water 3.25-4.5 ft in depth at the [facility]. Temp of water was 91.  Pt entered/exited the pool via stairs independently with bilat rail.  * forward walk with cues for heel strike and roll through 3 laps / lateral walking 2 laps; long stride 2 laps; march walk 2x10  * weight shifts forward- working on rolling through toes, then flat foot to D * backwards walk, rolling through feet * Seated ankle inversion/everson x20 ankle heel toe x20  * high knee marching with PF when knee flexed (somewhat painful at navicular) * ankle circles; return to walking forward/backward * Heel/toe raises holding wall (pain at top of R foot) * Step up on step 3'6 depth 2x10  * side to side lunge holding wall  * straddling yellow noodle:  bicycle motion; jumping jack motion * Holding rainbow hand buoys- squats 2x 10   Pt requires the buoyancy and hydrostatic pressure of water for support, and to offload joints by unweighting joint load by at least 50 % in naval deep water and by at least 75-80% in chest to neck deep water.  Viscosity of the water is needed for resistance of strengthening. Water current perturbations provides challenge to standing balance requiring increased core activation.   Last visit   Pt seen for aquatic therapy today.  Treatment took place in  water 3.25-4.5 ft in depth at the [facility]. Temp of water was 91.  Pt entered/exited the pool via stairs independently with bilat rail.  * forward walk with cues for heel strike and roll through * weight shifts forward- working on rolling through toes, then flat foot to DF * backwards walk, rolling through feet * side stepping  * high knee marching with PF when knee flexed (somewhat painful at navicular) * ankle circles; return to walking forward/backward * Heel/toe raises holding wall (pain at top of R foot) * SLS  * side to side lunge holding wall  * return to walking, relaxed form * straddling yellow noodle:  bicycle motion; jumping jack motion * toe walking (tiny steps) holding wall, to L- painful, stopped * Holding rainbow hand buoys- squats x 10; SLS with opp foot toe taps x 5 x 2 * L forward step ups in 4 ft water x 8,  R forward step downs/L retro step ups x 5 with UE on wall   Once dried off: applied [place] tape to L medial foot with 20% stretch (metatarsal head to just past navicular) and perpendicular strip applied over navicular - to decompress tissue, aid in desensitization and increase proprioception   Pt requires the buoyancy and hydrostatic pressure of water for support, and to offload joints by unweighting joint load by at least 50 % in naval deep water and by at least 75-80% in chest to neck deep water.  Viscosity of the water is needed for resistance of strengthening. Water current perturbations provides challenge to standing balance requiring increased core activation.     PATIENT EDUCATION:  Education details: HEP; benefits of aquatics  Person educated: Patient Education method: Explanation, Demonstration, Tactile cues, Verbal cues, and Handouts Education comprehension: verbalized  understanding, returned demonstration, verbal cues required, tactile cues required, and needs further education   HOME EXERCISE PROGRAM: Access Code: [email or web address] URL:  [email or web address] Date: 12/08/2021 Prepared by: Carolyne Littles  Exercises - Ankle Dorsiflexion with Resistance  - 1 x daily - 7 x weekly - 3 sets - 10 reps - Ankle and Toe Plantarflexion with Resistance  - 1 x daily - 7 x weekly - 3 sets - 10 reps - Long Sitting Calf Stretch with Strap  - 3 x daily - 7 x weekly - 1 sets - 3 reps - 20 hold - Seated Self Great Toe Mobilization  - 1 x daily - 7 x weekly - 3 sets - 10 reps   ASSESSMENT:  CLINICAL IMPRESSION: The patient tolerated treatment well. She was able to ambulate with minimal pain.  We discussed desensitization with the patient starting with a soft cloth and moving up to rougher materials.  She had some tightness at times with mobility activity, but overall she did well. She as able to do step ups for more reps and with less water support. We will continue  to progress as tolerated    OBJECTIVE IMPAIRMENTS Abnormal gait, decreased activity tolerance, decreased endurance, decreased mobility, difficulty walking, decreased ROM, decreased strength, increased edema, and pain.   ACTIVITY LIMITATIONS lifting, bending, standing, squatting, stairs, transfers, and locomotion level  PARTICIPATION LIMITATIONS: meal prep, cleaning, shopping, community activity, occupation, and yard work  PERSONAL FACTORS Fitness and 1-2 comorbidities: low back pain and teral knee pain   are also affecting patient's functional outcome.   REHAB POTENTIAL: Good  CLINICAL DECISION MAKING: Evolving/moderate complexity  EVALUATION COMPLEXITY: High   GOALS: Goals reviewed with patient? Yes  SHORT TERM GOALS: Target date: 01/25/2022 8/22 assessed  Patient will increase active DF by 5 degrees  Baseline: Goal status: per visual inspection improved   2.  Patient will demonstrate reduced lateral movement away from the great toe ambualting  Baseline:  Goal status: improved in the water   3.  Patient will increase gross right LE strength by 5 lbs   Baseline:  Goal status: Not tested.   LONG TERM GOALS: Target date: 02/22/2022   Patient will stand for 1/2 hour without pain in order to perform ADL's  Baseline:  Goal status: INITIAL  2.  Patient will ambulate 3000' without pain in order to go grocery shopping without pain  Baseline:  Goal status: INITIAL  3.  Patient will go up and down 6 steps without pain  Baseline:  Goal status: INITIAL    PLAN: PT FREQUENCY: 2x/week  PT DURATION: 8 weeks  PLANNED INTERVENTIONS: Therapeutic exercises, Therapeutic activity, Neuromuscular re-education, Balance training, Gait training, Patient/Family education, Self Care, Joint mobilization, Aquatic Therapy, Dry Needling, Cryotherapy, Moist heat, Ultrasound, Ionotophoresis '4mg'$ /ml Dexamethasone, Manual therapy, and Re-evaluation  PLAN FOR NEXT SESSION: in the water work on normalizing gait; improving ankle strength, improve gross strength of the left leg; improve ability to weight bear.   Carolyne Littles PT DPT  12/28/21 8:27 AM

## 2021-12-29 NOTE — Progress Notes (Signed)
Chief Complaint:   OBESITY [name] is here to discuss her progress with her obesity treatment plan along with follow-up of her obesity related diagnoses. [name] is on the Category 3 Plan and states she is following her eating plan approximately 80% of the time. [name] states she is in the pool, walking for 60 minutes 2 times per week.  Today's visit was #: 3 Starting weight: 333 lbs Starting date: 11/16/2021 Today's weight: 316 lbs Today's date: 12/21/2021 Total lbs lost to date: 17 Total lbs lost since last in-office visit: 3  Interim History: [name] started Saxenda 0.6 mg daily approximately 2 weeks ago.  She notes some burping and constipation.  She started pool exercises and walking, and averages between 3000 and 4000 steps per day.  She is out of CAM Walker.  She is enjoying some satiety, and has really focused on dietary changes and mindful eating.  Subjective:   1. Other specified hypothyroidism [name] recently increased levothyroxine to 100 mcg once daily, following rise in TSH levels.  2. Attention deficit hyperactivity disorder (ADHD), other type [name] recently started Adderall 20 mg XR daily by her PCP.  She denies feeling of anorexia or anxiety.  3. Other depression [name] recently added Wellbutrin XL 150 mg daily and Zoloft 100 mg twice daily.  She has seen improvements in her stress eating.  Managed by PCP.  Assessment/Plan:   1. Other specified hypothyroidism [name] will have TSH rechecked by her PCP in 3 months.  She will continue levothyroxine 100 mcg daily.  2. Attention deficit hyperactivity disorder (ADHD), other type [name] is to avoid future use of stimulants to treat obesity.  3. Other depression [name] will continue her current medications per her PCP.  4. Obesity, current BMI 49.5 [name] is currently in the action stage of change. As such, her goal is to continue with weight loss efforts. She has agreed to keeping a food journal and adhering to  recommended goals of 1500 calories and 130-140 grams of protein daily.   Additional dietary handouts were given.  We discussed various medication options to help [name] with her weight loss efforts and we both agreed to increase Saxenda to 1.2 mg daily injection, and we will refill for 1 month: Refill pen needles #100.  - Liraglutide -Weight Management (SAXENDA) 18 MG/3ML SOPN; Inject 1.2 mg into the skin daily.  Dispense: 15 mL; Refill: 0 - Insulin Pen Needle (B-D UF III MINI PEN NEEDLES) 31G X 5 MM MISC; Use once daily as directed  Dispense: 100 each; Refill: 0  Exercise goals: Increase daily step goal.   Behavioral modification strategies: increasing lean protein intake, increasing vegetables, increasing water intake, decreasing eating out, no skipping meals, better snacking choices, avoiding temptations, and decreasing junk food.  [name] has agreed to follow-up with our clinic in 3 weeks. She was informed of the importance of frequent follow-up visits to maximize her success with intensive lifestyle modifications for her multiple health conditions.   Objective:   Blood pressure 117/78, pulse 82, temperature 98 F (36.7 C), height '5\' 7"'$  (1.702 m), weight (!) 316 lb (143.3 kg), last menstrual period 11/02/2014, SpO2 96 %. Body mass index is 49.49 kg/m.  General: Cooperative, alert, well developed, in no acute distress. HEENT: Conjunctivae and lids unremarkable. Cardiovascular: Regular rhythm.  Lungs: Normal work of breathing. Neurologic: No focal deficits.   Lab Results  Component Value Date   CREATININE 0.80 11/16/2021   BUN 9 11/16/2021   NA 141 11/16/2021  K 4.3 11/16/2021   CL 101 11/16/2021   CO2 25 11/16/2021   Lab Results  Component Value Date   ALT 22 11/16/2021   AST 15 11/16/2021   ALKPHOS 68 11/16/2021   BILITOT 1.2 11/16/2021   Lab Results  Component Value Date   HGBA1C 5.8 (H) 11/16/2021   HGBA1C 5.8 06/25/2021   HGBA1C 5.7 10/16/2017   Lab Results   Component Value Date   INSULIN 17.5 11/16/2021   Lab Results  Component Value Date   TSH 5.360 (H) 11/16/2021   Lab Results  Component Value Date   CHOL 159 06/25/2021   HDL 38.90 (L) 06/25/2021   LDLCALC 92 06/25/2021   TRIG 140.0 06/25/2021   CHOLHDL 4 06/25/2021   Lab Results  Component Value Date   VD25OH 21.3 (L) 11/16/2021   VD25OH 13.51 (L) 06/25/2021   Lab Results  Component Value Date   WBC 9.4 11/16/2021   HGB 13.5 11/16/2021   HCT 40.6 11/16/2021   MCV 90 11/16/2021   PLT 280 11/16/2021   No results found for: "IRON", "TIBC", "FERRITIN"  Attestation Statements:   Reviewed by clinician on day of visit: allergies, medications, problem list, medical history, surgical history, family history, social history, and previous encounter notes.   [name], am acting as transcriptionist for Sara Gambler, DO.  I have reviewed the above documentation for accuracy and completeness, and I agree with the above. Sara Ponto, DO

## 2022-01-04 ENCOUNTER — Other Ambulatory Visit: Payer: Self-pay | Admitting: Physician Assistant

## 2022-01-04 ENCOUNTER — Encounter (HOSPITAL_BASED_OUTPATIENT_CLINIC_OR_DEPARTMENT_OTHER): Payer: Self-pay | Admitting: Pharmacist

## 2022-01-04 ENCOUNTER — Encounter (HOSPITAL_BASED_OUTPATIENT_CLINIC_OR_DEPARTMENT_OTHER): Payer: Self-pay | Admitting: Physical Therapy

## 2022-01-04 ENCOUNTER — Other Ambulatory Visit (HOSPITAL_BASED_OUTPATIENT_CLINIC_OR_DEPARTMENT_OTHER): Payer: Self-pay

## 2022-01-04 ENCOUNTER — Other Ambulatory Visit (INDEPENDENT_AMBULATORY_CARE_PROVIDER_SITE_OTHER): Payer: Self-pay | Admitting: Family Medicine

## 2022-01-04 ENCOUNTER — Ambulatory Visit (HOSPITAL_BASED_OUTPATIENT_CLINIC_OR_DEPARTMENT_OTHER): Payer: 59 | Admitting: Physical Therapy

## 2022-01-04 DIAGNOSIS — M25572 Pain in left ankle and joints of left foot: Secondary | ICD-10-CM

## 2022-01-04 DIAGNOSIS — M545 Low back pain, unspecified: Secondary | ICD-10-CM | POA: Diagnosis not present

## 2022-01-04 DIAGNOSIS — E559 Vitamin D deficiency, unspecified: Secondary | ICD-10-CM

## 2022-01-04 DIAGNOSIS — R262 Difficulty in walking, not elsewhere classified: Secondary | ICD-10-CM | POA: Diagnosis not present

## 2022-01-04 DIAGNOSIS — R2689 Other abnormalities of gait and mobility: Secondary | ICD-10-CM

## 2022-01-04 DIAGNOSIS — M25672 Stiffness of left ankle, not elsewhere classified: Secondary | ICD-10-CM

## 2022-01-04 DIAGNOSIS — M21612 Bunion of left foot: Secondary | ICD-10-CM | POA: Diagnosis not present

## 2022-01-04 DIAGNOSIS — R269 Unspecified abnormalities of gait and mobility: Secondary | ICD-10-CM | POA: Diagnosis not present

## 2022-01-04 DIAGNOSIS — M25372 Other instability, left ankle: Secondary | ICD-10-CM | POA: Diagnosis not present

## 2022-01-04 DIAGNOSIS — M25571 Pain in right ankle and joints of right foot: Secondary | ICD-10-CM | POA: Diagnosis not present

## 2022-01-04 NOTE — Therapy (Signed)
OUTPATIENT PHYSICAL THERAPY LOWER EXTREMITY TREATMENT NOTE   Patient Name: [name] MRN: [id number] DOB:1971-01-05, 51 y.o., female Today's Date: 01/04/2022   PT End of Session - 01/04/22 1801     Visit Number 5    Number of Visits 12    Date for PT Re-Evaluation 01/18/22    PT Start Time 1710    PT Stop Time 1194    PT Time Calculation (min) 45 min    Activity Tolerance Patient tolerated treatment well    Behavior During Therapy [facility] for tasks assessed/performed               Past Medical History:  Diagnosis Date   Anxiety    Arthritis    Asthma    Back pain    Barrett's esophagus    Chemotherapy-induced neuropathy (HCC)    Chronic lower back pain    Depression    Dyspnea    Elevated blood pressure, situational 05/05/2016   Endometriosis    Exercise-induced asthma 09/08/2015   Fatigue    Gallbladder problem    GERD (gastroesophageal reflux disease)    Heartburn    History of kidney stones    Hypertension    Hypothyroidism    Infertility, female    Joint pain    Joint stiffness    Lower extremity edema    Migraine    ovarian cancer    Ovarian cancer, bilateral ([place]) 11/28/2014   PONV (postoperative nausea and vomiting)    Sarcoidosis    Sleep apnea    SOBOE (shortness of breath on exertion)    Thyroid disease    Umbilical hernia    Vitamin D deficiency    woke up while porta cath removed    Past Surgical History:  Procedure Laterality Date   ABDOMINAL HYSTERECTOMY  11/10/2014   at [facility], Exp lap, supracervical hyst, BSO, infracolic omentectomy, lymphadenectomy, aortic lymph node sampling   BIOPSY  11/04/2021   Procedure: BIOPSY;  Surgeon: Jackquline Denmark, MD;  Location: [facility];  Service: Gastroenterology;;   BRONCHIAL NEEDLE ASPIRATION BIOPSY  12/16/2020   Procedure: BRONCHIAL NEEDLE ASPIRATION BIOPSIES;  Surgeon: Rigoberto Noel, MD;  Location: [facility] ENDOSCOPY;  Service: Cardiopulmonary;;   BRONCHIAL WASHINGS  12/16/2020   Procedure:  BRONCHIAL WASHINGS;  Surgeon: Rigoberto Noel, MD;  Location: [facility];  Service: Cardiopulmonary;;   BUNIONECTOMY Left 09/14/2021   CHOLECYSTECTOMY N/A 07/24/2018   Procedure: LAPAROSCOPIC CHOLECYSTECTOMY WITH INTRAOPERATIVE CHOLANGIOGRAM;  Surgeon: Armandina Gemma, MD;  Location: [facility] ORS;  Service: General;  Laterality: N/A;   COLONOSCOPY WITH PROPOFOL N/A 11/04/2021   Procedure: COLONOSCOPY WITH PROPOFOL;  Surgeon: Jackquline Denmark, MD;  Location: [facility] ENDOSCOPY;  Service: Gastroenterology;  Laterality: N/A;   ERCP N/A 07/26/2018   Procedure: ENDOSCOPIC RETROGRADE CHOLANGIOPANCREATOGRAPHY (ERCP);  Surgeon: Ladene Artist, MD;  Location: [facility];  Service: Endoscopy;  Laterality: N/A;   ESOPHAGOGASTRODUODENOSCOPY (EGD) WITH PROPOFOL N/A 11/04/2021   Procedure: ESOPHAGOGASTRODUODENOSCOPY (EGD) WITH PROPOFOL;  Surgeon: Jackquline Denmark, MD;  Location: [facility] ENDOS[facility];  Service: Gastroenterology;  Laterality: N/A;   GANGLION CYST EXCISION Right    hand   HYSTERECTOMY ABDOMINAL WITH SALPINGO-OOPHORECTOMY  11/10/2014   ovarian cancer, tumor removal   INCISIONAL HERNIA REPAIR N/A 12/25/2020   Procedure: LAPAROSCOPIC INCISIONAL HERNIA REPAIR WITH MESH;  Surgeon: Kinsinger, Arta Bruce, MD;  Location: [facility];  Service: General;  Laterality: N/A;   IR GENERIC HISTORICAL  05/12/2016   IR REMOVAL TUN ACCESS W/ PORT W/O FL MOD SED 05/12/2016 [facility]-INTERV RAD  LAPAROSCOPIC ENDOMETRIOSIS FULGURATION     LIPOMA EXCISION Left    ankle   meniscal tear Right    MENISCUS REPAIR Right    POLYPECTOMY  11/04/2021   Procedure: POLYPECTOMY;  Surgeon: Jackquline Denmark, MD;  Location: [facility];  Service: Gastroenterology;;   Glori Luis a cath removal     PORTA CATH INSERTION     REMOVAL OF STONES  07/26/2018   Procedure: REMOVAL OF STONES;  Surgeon: Ladene Artist, MD;  Location: [facility];  Service: Endoscopy;;   SPHINCTEROTOMY  07/26/2018   Procedure: Joan Mayans;  Surgeon: Ladene Artist, MD;  Location: [facility]  ENDOSCOPY;  Service: Endoscopy;;   UMBILICAL HERNIA REPAIR  12/29/2020   VIDEO BRONCHOSCOPY WITH ENDOBRONCHIAL ULTRASOUND N/A 12/16/2020   Procedure: VIDEO BRONCHOSCOPY WITH ENDOBRONCHIAL ULTRASOUND;  Surgeon: Rigoberto Noel, MD;  Location: [facility];  Service: Cardiopulmonary;  Laterality: N/A;   Patient Active Problem List   Diagnosis Date Noted   Hot flashes due to menopause 12/19/2021   ADD (attention deficit disorder) 12/19/2021   SOBOE (shortness of breath on exertion) 11/16/2021   Elevated glucose 11/16/2021   Vitamin D deficiency 11/16/2021   Prediabetes 11/16/2021   Bunion, left 08/06/2021   Morbid obesity ([place]) 06/26/2021   Venous insufficiency 06/25/2021   Incisional hernia 12/25/2020   Mediastinal lymphadenopathy    Pulmonary nodules 10/20/2020   Multiple skin nodules 07/24/2020   Viral illness 06/14/2020   Right lumbar radiculopathy 82/42/3536   Periumbilical hernia 14/43/1540   TB lung, latent 05/18/2020   Acute non-recurrent maxillary sinusitis 11/09/2019   HTN (hypertension) 04/14/2019   Mass of left wrist 04/14/2019   External otitis of right ear 04/14/2019   Choledocholithiasis    Abnormal cholangiogram    Cholecystitis with cholelithiasis 07/24/2018   Hyperglycemia 10/16/2017   Hot flash not due to menopause 10/16/2017   Eczema 10/16/2017   Flu-like symptoms 06/20/2017   Cognitive changes 01/30/2017   Disturbed concentration 01/26/2017   Left shoulder pain 11/01/2016   Right low back pain 11/01/2016   Elevated blood pressure, situational 05/05/2016   Surgical menopause 01/29/2016   OSA on CPAP 12/22/2015   Cough 11/26/2015   Capsulitis 10/06/2015   Ganglion cyst of left foot 10/06/2015   Fatigue 09/13/2015   Asthma 09/08/2015   Exertional dyspnea 09/08/2015   Peroneal ganglion cyst 07/20/2015   Low back pain 07/20/2015   Peroneal tendinitis of left lower leg 06/25/2015   Nonallopathic lesion of cervical region 06/25/2015   Wheezing 05/08/2015    Polyarthralgia 05/08/2015   Peripheral edema 03/16/2015   Central line complication 08/67/6195   Morbid obesity with BMI of 45.0-49.9, adult ([place]) 02/28/2015   Chemotherapy induced neutropenia ([place]) 02/28/2015   Screening for colorectal cancer 01/05/2015   Chemotherapy induced nausea and vomiting 01/02/2015   Chemotherapy-induced peripheral neuropathy ([place]) 01/02/2015   Premature surgical menopause 12/17/2014   Leukopenia due to antineoplastic chemotherapy ([place]) 12/17/2014   Port-A-Cath in place 12/17/2014   Encounter for antineoplastic chemotherapy 12/17/2014   Myalgia 12/09/2014   Hypersensitivity reaction 12/05/2014   Poor venous access 11/28/2014   Postoperative cellulitis of surgical wound 11/24/2014   Tobacco dependence 11/12/2014   Malignant neoplasm of both ovaries 11/10/2014   Anxiety 11/10/2014   Adnexal mass 11/10/2014   Encounter for well adult exam with abnormal findings 09/10/2014   Hypothyroidism 09/10/2014   Hypersomnolence 09/10/2014   Acute non-recurrent frontal sinusitis 06/24/2014   Right knee pain 06/24/2014   Lower back pain 01/08/2014   EAR PAIN, BILATERAL 12/18/2009  KNEE PAIN, RIGHT 12/18/2009   INGROWN TOENAIL 12/17/2008   BACK PAIN 12/17/2008   Acute bronchospasm 11/11/2008   Depression 11/29/2007   Insomnia 11/29/2007   Allergic rhinitis 04/13/2007   GERD 04/13/2007   Endometriosis determined by laparoscopy 04/13/2007   Migraine headache 01/29/2007    PCP: Dr Ina Kick   REFERRING PROVIDER: Dr Annice Needy   REFERRING DIAG: Left foot bunionectomy ( Great Toe)   THERAPY DIAG:  Other abnormalities of gait and mobility  Pain in left ankle and joints of left foot  Stiffness of left ankle, not elsewhere classified  Rationale for Evaluation and Treatment Rehabilitation  ONSET DATE: 09/13/2021  SUBJECTIVE:   SUBJECTIVE STATEMENT: Pt reports swimming 400 yards and going to a spin class in past week.  Some slight pain in  foot.  PERTINENT HISTORY: Anxiety, right knee arthritis; back pain, depression, dyspnea, chemo therapy induced neuropathy; LE edema, ovarian cancer ;   PAIN:  Are you having pain? yes NPRS scale: 2/10  Pain location: left foot on top of the great toe and prox incision area Pain description: aching and burning  Aggravating factors: standing and walking  Relieving factors: not putting pressure on the foot   PRECAUTIONS: None  WEIGHT BEARING RESTRICTIONS No  FALLS:  Has patient fallen in last 6 months? No  LIVING ENVIRONMENT: Steps in the house.  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   PLOF: Independent  PATIENT GOALS : to have less pain/ to walk better/ To get back into the gym if able    OBJECTIVE:   DIAGNOSTIC FINDINGS:  X-rays: Nothing significant   PATIENT SURVEYS:  FOTO    COGNITION:  Overall cognitive status: Within functional limits for tasks assessed     SENSATION: Baseline neuropathy from chemo in 2016   EDEMA:  Swelling when she is up and on it  eg  POSTURE: No Significant postural limitations  PALPATION:   LOWER EXTREMITY ROM:  Active ROM Right eval Left eval  Hip flexion    Hip extension    Hip abduction    Hip adduction    Hip internal rotation    Hip external rotation    Knee flexion    Knee extension    Ankle dorsiflexion 0 13  Ankle plantarflexion 40   Ankle inversion WNL   Ankle eversion WNL     (Blank rows = not tested)  PROM  Right eval Left eval  Hip flexion    Hip extension    Hip abduction    Hip adduction    Hip internal rotation    Hip external rotation    Knee flexion    Knee extension    Ankle dorsiflexion 5 pulling on the bottom of the foot noted  15  Ankle plantarflexion WNL   Ankle inversion WNL   Ankle eversion WNL     (Blank rows = not tested)  LOWER EXTREMITY MMT:  MMT Right eval Left eval  Hip flexion 33.0 25.6  Hip extension    Hip abduction 32.6 35.5  Hip adduction    Hip  internal rotation    Hip external rotation    Knee flexion    Knee extension 17.4 39.5  Ankle dorsiflexion    Ankle plantarflexion    Ankle inversion    Ankle eversion     (Blank rows = not tested)  FUNCTIONAL TESTS:  Shifts away from right foot with sit to stand transfer  GAIT:  Significant lateral movement away form the  great toe.Can see foot pressing into the lateral portion of the shoe. Decreased weight bearing on the left     TODAY'S TREATMENT: 8/29 Pt seen for aquatic therapy today.  Treatment took place in water 3.25-4.5 ft in depth at the [facility]. Temp of water was 91.  Pt entered/exited the pool via stairs independently with bilat rail.  * forward walk with cues for heel strike and roll through 3 laps / lateral walking 2 laps; long stride 2 laps; march walk 2x10  * backwards walk, rolling through feet *Heel walking 2 widths forward 2 back * Seated ankle inversion/everson x20 ankle heel toe x20; ankle circles * high knee marching with PF when knee flexed (no complaints of pain)  * Heel/toe raises holding wall  * Step up on step 3'6 depth R/L 2x10 *side lunge 4 widths of pool            * Holding rainbow hand buoys- squats 2x 10; bottom step x 10 *seated flutter kicking with ankle PF; seated add/abd with ankle df *Straddling noodle: cycling with ankle df/pf; jumping jack motion LE; scissor jumps.   8/21 Pt seen for aquatic therapy today.  Treatment took place in water 3.25-4.5 ft in depth at the [place]. Temp of water was 91.  Pt entered/exited the pool via stairs independently with bilat rail.  * forward walk with cues for heel strike and roll through 3 laps / lateral walking 2 laps; long stride 2 laps; march walk 2x10  * weight shifts forward- working on rolling through toes, then flat foot to D * backwards walk, rolling through feet * Seated ankle inversion/everson x20 ankle heel toe x20  * high knee marching with PF when knee  flexed (somewhat painful at navicular) * ankle circles; return to walking forward/backward * Heel/toe raises holding wall (pain at top of R foot) * Step up on step 3'6 depth 2x10  * side to side lunge holding wall  * straddling yellow noodle:  bicycle motion; jumping jack motion * Holding rainbow hand buoys- squats 2x 10 (good stretch instep)   Pt requires the buoyancy and hydrostatic pressure of water for support, and to offload joints by unweighting joint load by at least 50 % in naval deep water and by at least 75-80% in chest to neck deep water.  Viscosity of the water is needed for resistance of strengthening. Water current perturbations provides challenge to standing balance requiring increased core activation.       PATIENT EDUCATION:  Education details: HEP; benefits of aquatics  Person educated: Patient Education method: Explanation, Demonstration, Tactile cues, Verbal cues, and Handouts Education comprehension: verbalized understanding, returned demonstration, verbal cues required, tactile cues required, and needs further education   HOME EXERCISE PROGRAM: Access Code: [email or web address] URL: [email or web address] Date: 12/08/2021 Prepared by: Carolyne Littles  Exercises - Ankle Dorsiflexion with Resistance  - 1 x daily - 7 x weekly - 3 sets - 10 reps - Ankle and Toe Plantarflexion with Resistance  - 1 x daily - 7 x weekly - 3 sets - 10 reps - Long Sitting Calf Stretch with Strap  - 3 x daily - 7 x weekly - 1 sets - 3 reps - 20 hold - Seated Self Great Toe Mobilization  - 1 x daily - 7 x weekly - 3 sets - 10 reps   ASSESSMENT:  CLINICAL IMPRESSION: Pt reports increased exercise activities swimming and returning to spin class.  She did have to modify foot  positioning with spin class but reports overall tolerating it well. She reports some "good" stretching of midfoot with squats. Increased reps and decreased submersion with squats today. She is encouraged  to return  to walking outdoors beginning with 20 minutes (amount of time she reports she feels she can tolerate). Goals ongoing.    OBJECTIVE IMPAIRMENTS Abnormal gait, decreased activity tolerance, decreased endurance, decreased mobility, difficulty walking, decreased ROM, decreased strength, increased edema, and pain.   ACTIVITY LIMITATIONS lifting, bending, standing, squatting, stairs, transfers, and locomotion level  PARTICIPATION LIMITATIONS: meal prep, cleaning, shopping, community activity, occupation, and yard work  PERSONAL FACTORS Fitness and 1-2 comorbidities: low back pain and teral knee pain   are also affecting patient's functional outcome.   REHAB POTENTIAL: Good  CLINICAL DECISION MAKING: Evolving/moderate complexity  EVALUATION COMPLEXITY: High   GOALS: Goals reviewed with patient? Yes  SHORT TERM GOALS: Target date: 01/25/2022 8/22 assessed  Patient will increase active DF by 5 degrees  Baseline: Goal status: per visual inspection improved   2.  Patient will demonstrate reduced lateral movement away from the great toe ambualting  Baseline:  Goal status: improved in the water   3.  Patient will increase gross right LE strength by 5 lbs  Baseline:  Goal status: Not tested.   LONG TERM GOALS: Target date: 03/01/2022   Patient will stand for 1/2 hour without pain in order to perform ADL's  Baseline:  Goal status: INITIAL  2.  Patient will ambulate 3000' without pain in order to go grocery shopping without pain  Baseline:  Goal status: INITIAL  3.  Patient will go up and down 6 steps without pain  Baseline:  Goal status: INITIAL    PLAN: PT FREQUENCY: 2x/week  PT DURATION: 8 weeks  PLANNED INTERVENTIONS: Therapeutic exercises, Therapeutic activity, Neuromuscular re-education, Balance training, Gait training, Patient/Family education, Self Care, Joint mobilization, Aquatic Therapy, Dry Needling, Cryotherapy, Moist heat, Ultrasound, Ionotophoresis '4mg'$ /ml  Dexamethasone, Manual therapy, and Re-evaluation  PLAN FOR NEXT SESSION: in the water work on normalizing gait; improving ankle strength, improve gross strength of the left leg; improve ability to weight bear.   Stanton Kidney Tharon Aquas) Anasofia Micallef MPT 01/04/22 6:15 PM

## 2022-01-05 ENCOUNTER — Encounter (INDEPENDENT_AMBULATORY_CARE_PROVIDER_SITE_OTHER): Payer: Self-pay

## 2022-01-05 ENCOUNTER — Other Ambulatory Visit (HOSPITAL_BASED_OUTPATIENT_CLINIC_OR_DEPARTMENT_OTHER): Payer: Self-pay

## 2022-01-05 MED ORDER — OMEPRAZOLE 40 MG PO CPDR
40.0000 mg | DELAYED_RELEASE_CAPSULE | Freq: Every day | ORAL | 3 refills | Status: DC
  Filled 2022-01-05: qty 30, 30d supply, fill #0
  Filled 2022-02-10: qty 30, 30d supply, fill #1
  Filled 2022-03-17: qty 30, 30d supply, fill #2
  Filled 2022-04-15: qty 30, 30d supply, fill #3

## 2022-01-11 ENCOUNTER — Ambulatory Visit (INDEPENDENT_AMBULATORY_CARE_PROVIDER_SITE_OTHER): Payer: 59 | Admitting: Family Medicine

## 2022-01-11 ENCOUNTER — Ambulatory Visit (HOSPITAL_BASED_OUTPATIENT_CLINIC_OR_DEPARTMENT_OTHER): Payer: 59 | Attending: Podiatry | Admitting: Physical Therapy

## 2022-01-11 ENCOUNTER — Other Ambulatory Visit (HOSPITAL_BASED_OUTPATIENT_CLINIC_OR_DEPARTMENT_OTHER): Payer: Self-pay

## 2022-01-11 ENCOUNTER — Encounter (HOSPITAL_BASED_OUTPATIENT_CLINIC_OR_DEPARTMENT_OTHER): Payer: Self-pay | Admitting: Physical Therapy

## 2022-01-11 VITALS — BP 117/81 | HR 77 | Temp 98.1°F | Ht 67.0 in | Wt 312.0 lb

## 2022-01-11 DIAGNOSIS — R2689 Other abnormalities of gait and mobility: Secondary | ICD-10-CM | POA: Diagnosis not present

## 2022-01-11 DIAGNOSIS — R7303 Prediabetes: Secondary | ICD-10-CM

## 2022-01-11 DIAGNOSIS — E559 Vitamin D deficiency, unspecified: Secondary | ICD-10-CM

## 2022-01-11 DIAGNOSIS — E669 Obesity, unspecified: Secondary | ICD-10-CM | POA: Diagnosis not present

## 2022-01-11 DIAGNOSIS — M25572 Pain in left ankle and joints of left foot: Secondary | ICD-10-CM | POA: Diagnosis not present

## 2022-01-11 DIAGNOSIS — Z6841 Body Mass Index (BMI) 40.0 and over, adult: Secondary | ICD-10-CM

## 2022-01-11 DIAGNOSIS — M170 Bilateral primary osteoarthritis of knee: Secondary | ICD-10-CM

## 2022-01-11 DIAGNOSIS — M25672 Stiffness of left ankle, not elsewhere classified: Secondary | ICD-10-CM | POA: Diagnosis not present

## 2022-01-11 MED ORDER — VITAMIN D (ERGOCALCIFEROL) 1.25 MG (50000 UNIT) PO CAPS
50000.0000 [IU] | ORAL_CAPSULE | ORAL | 0 refills | Status: DC
  Filled 2022-01-11: qty 5, 35d supply, fill #0

## 2022-01-11 NOTE — Therapy (Signed)
OUTPATIENT PHYSICAL THERAPY LOWER EXTREMITY TREATMENT NOTE   Patient Name: [name] MRN: [id number] DOB:1970/05/24, 51 y.o., female Today's Date: 01/11/2022   PT End of Session - 01/11/22 1701     Visit Number 6    Number of Visits 12    Date for PT Re-Evaluation 01/18/22    PT Start Time 1700    PT Stop Time 1745    PT Time Calculation (min) 45 min    Activity Tolerance Patient tolerated treatment well    Behavior During Therapy [facility] for tasks assessed/performed                Past Medical History:  Diagnosis Date   Anxiety    Arthritis    Asthma    Back pain    Barrett's esophagus    Chemotherapy-induced neuropathy (HCC)    Chronic lower back pain    Depression    Dyspnea    Elevated blood pressure, situational 05/05/2016   Endometriosis    Exercise-induced asthma 09/08/2015   Fatigue    Gallbladder problem    GERD (gastroesophageal reflux disease)    Heartburn    History of kidney stones    Hypertension    Hypothyroidism    Infertility, female    Joint pain    Joint stiffness    Lower extremity edema    Migraine    ovarian cancer    Ovarian cancer, bilateral ([place]) 11/28/2014   PONV (postoperative nausea and vomiting)    Sarcoidosis    Sleep apnea    SOBOE (shortness of breath on exertion)    Thyroid disease    Umbilical hernia    Vitamin D deficiency    woke up while porta cath removed    Past Surgical History:  Procedure Laterality Date   ABDOMINAL HYSTERECTOMY  11/10/2014   at [facility], Exp lap, supracervical hyst, BSO, infracolic omentectomy, lymphadenectomy, aortic lymph node sampling   BIOPSY  11/04/2021   Procedure: BIOPSY;  Surgeon: Jackquline Denmark, MD;  Location: [facility];  Service: Gastroenterology;;   BRONCHIAL NEEDLE ASPIRATION BIOPSY  12/16/2020   Procedure: BRONCHIAL NEEDLE ASPIRATION BIOPSIES;  Surgeon: Rigoberto Noel, MD;  Location: [facility] ENDOSCOPY;  Service: Cardiopulmonary;;   BRONCHIAL WASHINGS  12/16/2020   Procedure:  BRONCHIAL WASHINGS;  Surgeon: Rigoberto Noel, MD;  Location: [place];  Service: Cardiopulmonary;;   BUNIONECTOMY Left 09/14/2021   CHOLECYSTECTOMY N/A 07/24/2018   Procedure: LAPAROSCOPIC CHOLECYSTECTOMY WITH INTRAOPERATIVE CHOLANGIOGRAM;  Surgeon: Armandina Gemma, MD;  Location: [facility] OR[facility];  Service: General;  Laterality: N/A;   COLONOSCOPY WITH PROPOFOL N/A 11/04/2021   Procedure: COLONOSCOPY WITH PROPOFOL;  Surgeon: Jackquline Denmark, MD;  Location: [facility] ENDOSCOPY;  Service: Gastroenterology;  Laterality: N/A;   ERCP N/A 07/26/2018   Procedure: ENDOSCOPIC RETROGRADE CHOLANGIOPANCREATOGRAPHY (ERCP);  Surgeon: Ladene Artist, MD;  Location: [facility];  Service: Endoscopy;  Laterality: N/A;   ESOPHAGOGASTRODUODENOSCOPY (EGD) WITH PROPOFOL N/A 11/04/2021   Procedure: ESOPHAGOGASTRODUODENOSCOPY (EGD) WITH PROPOFOL;  Surgeon: Jackquline Denmark, MD;  Location: [facility] ENDOS[facility];  Service: Gastroenterology;  Laterality: N/A;   GANGLION CYST EXCISION Right    hand   HYSTERECTOMY ABDOMINAL WITH SALPINGO-OOPHORECTOMY  11/10/2014   ovarian cancer, tumor removal   INCISIONAL HERNIA REPAIR N/A 12/25/2020   Procedure: LAPAROSCOPIC INCISIONAL HERNIA REPAIR WITH MESH;  Surgeon: Kinsinger, Arta Bruce, MD;  Location: [facility];  Service: General;  Laterality: N/A;   IR GENERIC HISTORICAL  05/12/2016   IR REMOVAL TUN ACCESS W/ PORT W/O FL MOD SED 05/12/2016 [facility]-INTERV RAD  LAPAROSCOPIC ENDOMETRIOSIS FULGURATION     LIPOMA EXCISION Left    ankle   meniscal tear Right    MENISCUS REPAIR Right    POLYPECTOMY  11/04/2021   Procedure: POLYPECTOMY;  Surgeon: Jackquline Denmark, MD;  Location: [facility];  Service: Gastroenterology;;   Glori Luis a cath removal     PORTA CATH INSERTION     REMOVAL OF STONES  07/26/2018   Procedure: REMOVAL OF STONES;  Surgeon: Ladene Artist, MD;  Location: [facility];  Service: Endoscopy;;   SPHINCTEROTOMY  07/26/2018   Procedure: Joan Mayans;  Surgeon: Ladene Artist, MD;  Location: [facility]  ENDOS[facility];  Service: Endoscopy;;   UMBILICAL HERNIA REPAIR  12/29/2020   VIDEO BRONCHOSCOPY WITH ENDOBRONCHIAL ULTRASOUND N/A 12/16/2020   Procedure: VIDEO BRONCHOSCOPY WITH ENDOBRONCHIAL ULTRASOUND;  Surgeon: Rigoberto Noel, MD;  Location: [place];  Service: Cardiopulmonary;  Laterality: N/A;   Patient Active Problem List   Diagnosis Date Noted   Hot flashes due to menopause 12/19/2021   ADD (attention deficit disorder) 12/19/2021   SOBOE (shortness of breath on exertion) 11/16/2021   Elevated glucose 11/16/2021   Vitamin D deficiency 11/16/2021   Prediabetes 11/16/2021   Bunion, left 08/06/2021   Morbid obesity ([place]) 06/26/2021   Venous insufficiency 06/25/2021   Incisional hernia 12/25/2020   Mediastinal lymphadenopathy    Pulmonary nodules 10/20/2020   Multiple skin nodules 07/24/2020   Viral illness 06/14/2020   Right lumbar radiculopathy 25/42/7062   Periumbilical hernia 37/62/8315   TB lung, latent 05/18/2020   Acute non-recurrent maxillary sinusitis 11/09/2019   HTN (hypertension) 04/14/2019   Mass of left wrist 04/14/2019   External otitis of right ear 04/14/2019   Choledocholithiasis    Abnormal cholangiogram    Cholecystitis with cholelithiasis 07/24/2018   Hyperglycemia 10/16/2017   Hot flash not due to menopause 10/16/2017   Eczema 10/16/2017   Flu-like symptoms 06/20/2017   Cognitive changes 01/30/2017   Disturbed concentration 01/26/2017   Left shoulder pain 11/01/2016   Right low back pain 11/01/2016   Elevated blood pressure, situational 05/05/2016   Surgical menopause 01/29/2016   OSA on CPAP 12/22/2015   Cough 11/26/2015   Capsulitis 10/06/2015   Ganglion cyst of left foot 10/06/2015   Fatigue 09/13/2015   Asthma 09/08/2015   Exertional dyspnea 09/08/2015   Peroneal ganglion cyst 07/20/2015   Low back pain 07/20/2015   Peroneal tendinitis of left lower leg 06/25/2015   Nonallopathic lesion of cervical region 06/25/2015   Wheezing 05/08/2015    Polyarthralgia 05/08/2015   Peripheral edema 03/16/2015   Central line complication 17/61/6073   Morbid obesity with BMI of 45.0-49.9, adult ([place]) 02/28/2015   Chemotherapy induced neutropenia ([place]) 02/28/2015   Screening for colorectal cancer 01/05/2015   Chemotherapy induced nausea and vomiting 01/02/2015   Chemotherapy-induced peripheral neuropathy ([place]) 01/02/2015   Premature surgical menopause 12/17/2014   Leukopenia due to antineoplastic chemotherapy ([place]) 12/17/2014   Port-A-Cath in place 12/17/2014   Encounter for antineoplastic chemotherapy 12/17/2014   Myalgia 12/09/2014   Hypersensitivity reaction 12/05/2014   Poor venous access 11/28/2014   Postoperative cellulitis of surgical wound 11/24/2014   Tobacco dependence 11/12/2014   Malignant neoplasm of both ovaries 11/10/2014   Anxiety 11/10/2014   Adnexal mass 11/10/2014   Encounter for well adult exam with abnormal findings 09/10/2014   Hypothyroidism 09/10/2014   Hypersomnolence 09/10/2014   Acute non-recurrent frontal sinusitis 06/24/2014   Right knee pain 06/24/2014   Lower back pain 01/08/2014   EAR PAIN, BILATERAL 12/18/2009  KNEE PAIN, RIGHT 12/18/2009   INGROWN TOENAIL 12/17/2008   BACK PAIN 12/17/2008   Acute bronchospasm 11/11/2008   Depression 11/29/2007   Insomnia 11/29/2007   Allergic rhinitis 04/13/2007   GERD 04/13/2007   Endometriosis determined by laparoscopy 04/13/2007   Migraine headache 01/29/2007    PCP: Dr Ina Kick   REFERRING PROVIDER: Dr Annice Needy   REFERRING DIAG: Left foot bunionectomy ( Great Toe)   THERAPY DIAG:  Other abnormalities of gait and mobility  Pain in left ankle and joints of left foot  Stiffness of left ankle, not elsewhere classified  Rationale for Evaluation and Treatment Rehabilitation  ONSET DATE: 09/13/2021  SUBJECTIVE:   SUBJECTIVE STATEMENT: "I had a little pain right after last session but after a while it felt good.  It always feels better  after working it and leaving here."  PERTINENT HISTORY: Anxiety, right knee arthritis; back pain, depression, dyspnea, chemo therapy induced neuropathy; LE edema, ovarian cancer ;   PAIN:  Are you having pain? Yes NPRS scale: 2/10  Pain location: left foot on top of the great toe and prox incision area Pain description: aching and burning  Aggravating factors: standing and walking  Relieving factors: not putting pressure on the foot   PRECAUTIONS: None  WEIGHT BEARING RESTRICTIONS No  FALLS:  Has patient fallen in last 6 months? No  LIVING ENVIRONMENT: Steps in the house.  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   PLOF: Independent  PATIENT GOALS : to have less pain/ to walk better/ To get back into the gym if able    OBJECTIVE:   DIAGNOSTIC FINDINGS:  X-rays: Nothing significant   PATIENT SURVEYS:  FOTO    COGNITION:  Overall cognitive status: Within functional limits for tasks assessed     SENSATION: Baseline neuropathy from chemo in 2016   EDEMA:  Swelling when she is up and on it  eg  POSTURE: No Significant postural limitations  PALPATION:   LOWER EXTREMITY ROM:  Active ROM Right eval Left eval  Hip flexion    Hip extension    Hip abduction    Hip adduction    Hip internal rotation    Hip external rotation    Knee flexion    Knee extension    Ankle dorsiflexion 0 13  Ankle plantarflexion 40   Ankle inversion WNL   Ankle eversion WNL     (Blank rows = not tested)  PROM  Right eval Left eval  Hip flexion    Hip extension    Hip abduction    Hip adduction    Hip internal rotation    Hip external rotation    Knee flexion    Knee extension    Ankle dorsiflexion 5 pulling on the bottom of the foot noted  15  Ankle plantarflexion WNL   Ankle inversion WNL   Ankle eversion WNL     (Blank rows = not tested)  LOWER EXTREMITY MMT:  MMT Right eval Left eval  Hip flexion 33.0 25.6  Hip extension    Hip abduction 32.6 35.5   Hip adduction    Hip internal rotation    Hip external rotation    Knee flexion    Knee extension 17.4 39.5  Ankle dorsiflexion    Ankle plantarflexion    Ankle inversion    Ankle eversion     (Blank rows = not tested)  FUNCTIONAL TESTS:  Shifts away from right foot with sit to stand transfer  GAIT:  Significant lateral movement away form the great toe.Can see foot pressing into the lateral portion of the shoe. Decreased weight bearing on the left     TODAY'S TREATMENT: 9/5  Manual stretching gr toe into flex and extension   Pt seen for aquatic therapy today.  Treatment took place in water 3.25-4.5 ft in depth at the [place]. Temp of water was 91.  Pt entered/exited the pool via stairs independently with bilat rail.   * forward walk with cues for heel strike and roll through 3 laps / lateral walking 2 laps; long stride 2 laps; march walk 2x10  * backwards walk, rolling through feet *Heel walking 2 widths forward 2 back *toe walking 80%submerged 4 widths *inversion walking x4 * Heel/toe raises ue supported yellow noodle *Straddling noodle: cycling with ankle df/pf; jumping jack motion LE; scissor jumps. *Stair climbing x 6 steps alternating le ue support on handrail x 3 indep *Squats bottom step x 10 *seated flutter kicking with ankle PF; seated add/abd with ankle df   * Seated ankle inversion/everson x20 ankle heel toe x20; ankle circles    8/21 Pt seen for aquatic therapy today.  Treatment took place in water 3.25-4.5 ft in depth at the [place]. Temp of water was 91.  Pt entered/exited the pool via stairs independently with bilat rail.  * forward walk with cues for heel strike and roll through 3 laps / lateral walking 2 laps; long stride 2 laps; march walk 2x10  * weight shifts forward- working on rolling through toes, then flat foot to D * backwards walk, rolling through feet * Seated ankle inversion/everson x20 ankle heel toe  x20  * high knee marching with PF when knee flexed (somewhat painful at navicular) * ankle circles; return to walking forward/backward * Heel/toe raises holding wall (pain at top of R foot) * Step up on step 3'6 depth 2x10  * side to side lunge holding wall  * straddling yellow noodle:  bicycle motion; jumping jack motion * Holding rainbow hand buoys- squats 2x 10 (good stretch instep)   Pt requires the buoyancy and hydrostatic pressure of water for support, and to offload joints by unweighting joint load by at least 50 % in naval deep water and by at least 75-80% in chest to neck deep water.  Viscosity of the water is needed for resistance of strengthening. Water current perturbations provides challenge to standing balance requiring increased core activation.       PATIENT EDUCATION:  Education details: HEP; benefits of aquatics  Person educated: Patient Education method: Explanation, Demonstration, Tactile cues, Verbal cues, and Handouts Education comprehension: verbalized understanding, returned demonstration, verbal cues required, tactile cues required, and needs further education   HOME EXERCISE PROGRAM: Access Code: [email or web address] URL: [email or web address] Date: 12/08/2021 Prepared by: Carolyne Littles  Exercises - Ankle Dorsiflexion with Resistance  - 1 x daily - 7 x weekly - 3 sets - 10 reps - Ankle and Toe Plantarflexion with Resistance  - 1 x daily - 7 x weekly - 3 sets - 10 reps - Long Sitting Calf Stretch with Strap  - 3 x daily - 7 x weekly - 1 sets - 3 reps - 20 hold - Seated Self Great Toe Mobilization  - 1 x daily - 7 x weekly - 3 sets - 10 reps   ASSESSMENT:  CLINICAL IMPRESSION: Has not yet tried going out to walk, has only been walking through home and at work. Good response to last  visit . She demonstrates and reports climbing steps at home without pain.  Does wear shoes at home to be more secure when climbing step. She amb without lateral movement  away from left gr toe submerged 100% of time. She has walked through [employer] and [employer] without increase in pain. She has reached her max potential in aquatics and is ready to return to "on land" therapy and/or her desired progression back to gym setting.     OBJECTIVE IMPAIRMENTS Abnormal gait, decreased activity tolerance, decreased endurance, decreased mobility, difficulty walking, decreased ROM, decreased strength, increased edema, and pain.   ACTIVITY LIMITATIONS lifting, bending, standing, squatting, stairs, transfers, and locomotion level  PARTICIPATION LIMITATIONS: meal prep, cleaning, shopping, community activity, occupation, and yard work  PERSONAL FACTORS Fitness and 1-2 comorbidities: low back pain and teral knee pain   are also affecting patient's functional outcome.   REHAB POTENTIAL: Good  CLINICAL DECISION MAKING: Evolving/moderate complexity  EVALUATION COMPLEXITY: High   GOALS: Goals reviewed with patient? Yes  SHORT TERM GOALS: Target date: 01/25/2022 8/22 assessed  Patient will increase active DF by 5 degrees  Baseline: Goal status: per visual inspection improved   2.  Patient will demonstrate reduced lateral movement away from the great toe ambualting  Baseline:  Goal status: improved in the water   3.  Patient will increase gross right LE strength by 5 lbs  Baseline:  Goal status: Not tested.   LONG TERM GOALS: Target date: 03/08/2022   Patient will stand for 1/2 hour without pain in order to perform ADL's  Baseline:  Goal status: INITIAL  2.  Patient will ambulate 3000' without pain in order to go grocery shopping without pain  Baseline:  Goal status: Achieved 9/5  3.  Patient will go up and down 6 steps without pain  Baseline:  Goal status: Achieved 9/5    PLAN: PT FREQUENCY: 2x/week  PT DURATION: 8 weeks  PLANNED INTERVENTIONS: Therapeutic exercises, Therapeutic activity, Neuromuscular re-education, Balance training, Gait training,  Patient/Family education, Self Care, Joint mobilization, Aquatic Therapy, Dry Needling, Cryotherapy, Moist heat, Ultrasound, Ionotophoresis '4mg'$ /ml Dexamethasone, Manual therapy, and Re-evaluation  PLAN FOR NEXT SESSION: in the water work on normalizing gait; improving ankle strength, improve gross strength of the left leg; improve ability to weight bear.   Stanton Kidney Tharon Aquas) Weber Monnier MPT 01/11/22 5:24 PM

## 2022-01-12 NOTE — Progress Notes (Unsigned)
Office Visit Note  Patient: [name]             Date of Birth: 10/13/1970           MRN: [id number]             PCP: Biagio Borg, MD Referring: Biagio Borg, MD Visit Date: 01/26/2022 Occupation: _0 @  Subjective:  Dry mouth   History of Present Illness: Jaelle Campanile is a 51 y.o. female with history of sarcoidosis and osteoarthritis.  Patient remains on methotrexate 0.8 mL sq injections once weekly and folic acid 2 mg daily.  She has been tolerating injectable methotrexate better as time is gone on.  She has had less fatigue.  She continues to take Zofran as needed for nausea.  Overall she has noticed about a 75% improvement in her symptoms since initiating methotrexate.  She states that her dry cough has resolved.  She states that her wheezing has been well controlled using Advair.  She denies any new or worsening pulmonary symptoms.  She states she continues to have chronic pain in both knee joints.  She denies any knee joint swelling at this time.  She has undergone Visco gel injections in the past for her right knee but does not want to proceed with repeat gel shots at this time.  Of note she has been experiencing increased dry mouth despite using Biotene spray as well as drinking fluids throughout the day.  She remains on Adderall and Flexeril and has not had a change in dose recently.  Her only new medication is [place]. She has not yet seen ophthalmology. She does not currently have a follow-up scheduled with Dr. Elsworth Soho. She denies any recent or recurrent infections.    Activities of Daily Living:  Patient reports morning stiffness for 5 minutes.   Patient Reports nocturnal pain.  Difficulty dressing/grooming: Denies Difficulty climbing stairs: Denies Difficulty getting out of chair: Denies Difficulty using hands for taps, buttons, cutlery, and/or writing: Reports  Review of Systems  Constitutional:  Positive for fatigue.  HENT:  Positive for mouth sores and mouth  dryness.   Eyes:  Positive for dryness.  Respiratory:  Negative for shortness of breath.   Cardiovascular:  Negative for chest pain and palpitations.  Gastrointestinal:  Positive for constipation. Negative for blood in stool and diarrhea.  Endocrine: Negative for increased urination.  Genitourinary:  Negative for involuntary urination.  Musculoskeletal:  Positive for joint pain, joint pain, joint swelling, muscle weakness and morning stiffness. Negative for gait problem, myalgias, muscle tenderness and myalgias.  Skin:  Positive for rash. Negative for color change, hair loss and sensitivity to sunlight.  Allergic/Immunologic: Negative for susceptible to infections.  Neurological:  Positive for headaches. Negative for dizziness.  Hematological:  Negative for swollen glands.  Psychiatric/Behavioral:  Positive for sleep disturbance. Negative for depressed mood. The patient is not nervous/anxious.     PMFS History:  Patient Active Problem List   Diagnosis Date Noted   Osteoarthritis of both knees 01/13/2022   Hot flashes due to menopause 12/19/2021   ADD (attention deficit disorder) 12/19/2021   SOBOE (shortness of breath on exertion) 11/16/2021   Elevated glucose 11/16/2021   Vitamin D deficiency 11/16/2021   Prediabetes 11/16/2021   Bunion, left 08/06/2021   Morbid obesity ([place]) 06/26/2021   Venous insufficiency 06/25/2021   Incisional hernia 12/25/2020   Mediastinal lymphadenopathy    Pulmonary nodules 10/20/2020   Multiple skin nodules 07/24/2020   Viral illness 06/14/2020  Right lumbar radiculopathy 79/89/2119   Periumbilical hernia 41/74/0814   TB lung, latent 05/18/2020   Acute non-recurrent maxillary sinusitis 11/09/2019   HTN (hypertension) 04/14/2019   Mass of left wrist 04/14/2019   External otitis of right ear 04/14/2019   Choledocholithiasis    Abnormal cholangiogram    Cholecystitis with cholelithiasis 07/24/2018   Hyperglycemia 10/16/2017   Hot flash not due to  menopause 10/16/2017   Eczema 10/16/2017   Flu-like symptoms 06/20/2017   Cognitive changes 01/30/2017   Disturbed concentration 01/26/2017   Left shoulder pain 11/01/2016   Right low back pain 11/01/2016   Elevated blood pressure, situational 05/05/2016   Surgical menopause 01/29/2016   OSA on CPAP 12/22/2015   Cough 11/26/2015   Capsulitis 10/06/2015   Ganglion cyst of left foot 10/06/2015   Fatigue 09/13/2015   Asthma 09/08/2015   Exertional dyspnea 09/08/2015   Peroneal ganglion cyst 07/20/2015   Low back pain 07/20/2015   Peroneal tendinitis of left lower leg 06/25/2015   Nonallopathic lesion of cervical region 06/25/2015   Wheezing 05/08/2015   Polyarthralgia 05/08/2015   Peripheral edema 03/16/2015   Central line complication 48/18/5631   Morbid obesity with BMI of 45.0-49.9, adult ([place]) 02/28/2015   Chemotherapy induced neutropenia ([place]) 02/28/2015   Screening for colorectal cancer 01/05/2015   Chemotherapy induced nausea and vomiting 01/02/2015   Chemotherapy-induced peripheral neuropathy ([place]) 01/02/2015   Premature surgical menopause 12/17/2014   Leukopenia due to antineoplastic chemotherapy ([place]) 12/17/2014   Port-A-Cath in place 12/17/2014   Encounter for antineoplastic chemotherapy 12/17/2014   Myalgia 12/09/2014   Hypersensitivity reaction 12/05/2014   Poor venous access 11/28/2014   Postoperative cellulitis of surgical wound 11/24/2014   Tobacco dependence 11/12/2014   Malignant neoplasm of both ovaries 11/10/2014   Anxiety 11/10/2014   Adnexal mass 11/10/2014   Encounter for well adult exam with abnormal findings 09/10/2014   Hypothyroidism 09/10/2014   Hypersomnolence 09/10/2014   Acute non-recurrent frontal sinusitis 06/24/2014   Right knee pain 06/24/2014   Lower back pain 01/08/2014   EAR PAIN, BILATERAL 12/18/2009   KNEE PAIN, RIGHT 12/18/2009   INGROWN TOENAIL 12/17/2008   BACK PAIN 12/17/2008   Acute bronchospasm 11/11/2008   Depression  11/29/2007   Insomnia 11/29/2007   Allergic rhinitis 04/13/2007   GERD 04/13/2007   Endometriosis determined by laparoscopy 04/13/2007   Migraine headache 01/29/2007    Past Medical History:  Diagnosis Date   Anxiety    Arthritis    Asthma    Back pain    Barrett's esophagus    Chemotherapy-induced neuropathy (HCC)    Chronic lower back pain    Depression    Dyspnea    Elevated blood pressure, situational 05/05/2016   Endometriosis    Exercise-induced asthma 09/08/2015   Fatigue    Gallbladder problem    GERD (gastroesophageal reflux disease)    Heartburn    History of kidney stones    Hypertension    Hypothyroidism    Infertility, female    Joint pain    Joint stiffness    Lower extremity edema    Migraine    ovarian cancer    Ovarian cancer, bilateral ([place]) 11/28/2014   PONV (postoperative nausea and vomiting)    Sarcoidosis    Sleep apnea    SOBOE (shortness of breath on exertion)    Thyroid disease    Umbilical hernia    Vitamin D deficiency    woke up while porta cath removed  Family History  Problem Relation Age of Onset   Hyperlipidemia Mother    Hypertension Mother    Diabetes Mother    Stroke Mother        Deceased   Heart disease Mother    Osteoarthritis Mother    Atrial fibrillation Father        Living   Stroke Father        optic nerve left eye   Healthy Brother    Heart failure Maternal Grandmother    Heart attack Maternal Grandfather    Dementia Paternal Grandmother    Dementia Paternal Grandfather    Diabetes Maternal Aunt    Past Surgical History:  Procedure Laterality Date   ABDOMINAL HYSTERECTOMY  11/10/2014   at [facility], Exp lap, supracervical hyst, BSO, infracolic omentectomy, lymphadenectomy, aortic lymph node sampling   BIOPSY  11/04/2021   Procedure: BIOPSY;  Surgeon: Jackquline Denmark, MD;  Location: [facility];  Service: Gastroenterology;;   BRONCHIAL NEEDLE ASPIRATION BIOPSY  12/16/2020   Procedure: BRONCHIAL NEEDLE  ASPIRATION BIOPSIES;  Surgeon: Rigoberto Noel, MD;  Location: [place];  Service: Cardiopulmonary;;   BRONCHIAL WASHINGS  12/16/2020   Procedure: BRONCHIAL WASHINGS;  Surgeon: Rigoberto Noel, MD;  Location: [place];  Service: Cardiopulmonary;;   BUNIONECTOMY Left 09/14/2021   CHOLECYSTECTOMY N/A 07/24/2018   Procedure: LAPAROSCOPIC CHOLECYSTECTOMY WITH INTRAOPERATIVE CHOLANGIOGRAM;  Surgeon: Armandina Gemma, MD;  Location: [facility];  Service: General;  Laterality: N/A;   COLONOSCOPY WITH PROPOFOL N/A 11/04/2021   Procedure: COLONOSCOPY WITH PROPOFOL;  Surgeon: Jackquline Denmark, MD;  Location: [facility];  Service: Gastroenterology;  Laterality: N/A;   ERCP N/A 07/26/2018   Procedure: ENDOSCOPIC RETROGRADE CHOLANGIOPANCREATOGRAPHY (ERCP);  Surgeon: Ladene Artist, MD;  Location: [facility];  Service: Endoscopy;  Laterality: N/A;   ESOPHAGOGASTRODUODENOSCOPY (EGD) WITH PROPOFOL N/A 11/04/2021   Procedure: ESOPHAGOGASTRODUODENOSCOPY (EGD) WITH PROPOFOL;  Surgeon: Jackquline Denmark, MD;  [facility];  Service: Gastroenterology;  Laterality: N/A;   GANGLION CYST EXCISION Right    hand   HYSTERECTOMY ABDOMINAL WITH SALPINGO-OOPHORECTOMY  11/10/2014   ovarian cancer, tumor removal   INCISIONAL HERNIA REPAIR N/A 12/25/2020   Procedure: LAPAROSCOPIC INCISIONAL HERNIA REPAIR WITH MESH;  Surgeon: Kinsinger, Arta Bruce, MD;  Location: [facility];  Service: General;  Laterality: N/A;   IR GENERIC HISTORICAL  05/12/2016   IR REMOVAL TUN ACCESS W/ PORT W/O FL MOD SED 05/12/2016 [facility]   LAPAROSCOPIC ENDOMETRIOSIS FULGURATION     LIPOMA EXCISION Left    ankle   meniscal tear Right    MENISCUS REPAIR Right    POLYPECTOMY  11/04/2021   Procedure: POLYPECTOMY;  Surgeon: Jackquline Denmark, MD;  Location: [facility];  Service: Gastroenterology;;   Glori Luis a cath removal     PORTA CATH INSERTION     REMOVAL OF STONES  07/26/2018   Procedure: REMOVAL OF STONES;  Surgeon: Ladene Artist, MD;   Location: [facility];  Service: Endoscopy;;   SPHINCTEROTOMY  07/26/2018   Procedure: Joan Mayans;  Surgeon: Ladene Artist, MD;  [facility];  Service: Endoscopy;;   UMBILICAL HERNIA REPAIR  12/29/2020   VIDEO BRONCHOSCOPY WITH ENDOBRONCHIAL ULTRASOUND N/A 12/16/2020   Procedure: VIDEO BRONCHOSCOPY WITH ENDOBRONCHIAL ULTRASOUND;  Surgeon: Rigoberto Noel, MD;  Location: [place];  Service: Cardiopulmonary;  Laterality: N/A;   Social History   Social History Narrative   Lives with boyfriend in a one story home.  Has no children.     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  Education: college.   Right handed    Immunization History  Administered Date(s) Administered   Influenza,inj,Quad PF,6+ Mos 01/02/2014, 03/26/2015, 12/25/2017, 01/21/2020, 01/25/2021   Influenza-Unspecified 02/23/2016, 02/09/2017, 02/12/2020   MMR 02/09/2017, 07/03/2017   PFIZER(Purple Top)SARS-COV-2 Vaccination 08/01/2019, 08/22/2019, 02/22/2020   Tdap 11/07/2017   Tetanus 12/21/2015   Zoster Recombinat (Shingrix) 06/22/2021, 11/26/2021     Objective: Vital Signs: BP 125/83 (BP Location: Left Arm, Patient Position: Sitting, Cuff Size: Normal)   Pulse 74   Resp 15   Ht _0  (1.702 m)   Wt (!) 311 lb 6.4 oz (141.3 kg)   LMP 11/02/2014   BMI 48.77 kg/m    Physical Exam Vitals and nursing note reviewed.  Constitutional:      Appearance: She is well-developed.  HENT:     Head: Normocephalic and atraumatic.  Eyes:     Conjunctiva/sclera: Conjunctivae normal.  Cardiovascular:     Rate and Rhythm: Normal rate and regular rhythm.     Heart sounds: Normal heart sounds.  Pulmonary:     Effort: Pulmonary effort is normal.     Breath sounds: Normal breath sounds.  Abdominal:     General: Bowel sounds are normal.     Palpations: Abdomen is soft.  Musculoskeletal:     Cervical back: Normal range of motion.  Skin:    General: Skin is warm and dry.     Capillary Refill: Capillary refill takes less  than 2 seconds.  Neurological:     Mental Status: She is alert and oriented to person, place, and time.  Psychiatric:        Behavior: Behavior normal.      Musculoskeletal Exam: C-spine has good range of motion with no discomfort.  Shoulder joints have good range of motion with no discomfort.  Elbow joints have good range of motion with no tenderness or synovitis.  Wrist joints, MCPs, PIPs, DIPs have good range of motion with no synovitis.  Complete fist formation bilaterally.  Hip joints have good range of motion with no groin pain.  Limited extension of the right knee joint.  No warmth or effusion of knee joints noted.  Ankle joints have good range of motion with no tenderness or joint swelling.  CDAI Exam: CDAI Score: -- Patient Global: --; Provider Global: -- Swollen: --; Tender: -- Joint Exam 01/26/2022   No joint exam has been documented for this visit   There is currently no information documented on the homunculus. Go to the Rheumatology activity and complete the homunculus joint exam.  Investigation: No additional findings.  Imaging: DG Foot Complete Left  Result Date: 01/05/2022 Please see detailed radiograph report in office note.   Recent Labs: Lab Results  Component Value Date   WBC 9.4 11/16/2021   HGB 13.5 11/16/2021   PLT 280 11/16/2021   NA 141 11/16/2021   K 4.3 11/16/2021   CL 101 11/16/2021   CO2 25 11/16/2021   GLUCOSE 101 (H) 11/16/2021   BUN 9 11/16/2021   CREATININE 0.80 11/16/2021   BILITOT 1.2 11/16/2021   ALKPHOS 68 11/16/2021   AST 15 11/16/2021   ALT 22 11/16/2021   PROT 7.4 11/16/2021   ALBUMIN 4.5 11/16/2021   CALCIUM 9.6 11/16/2021   GFRAA 110 11/11/2020   QFTBGOLDPLUS NEGATIVE 11/11/2020    Speciality Comments: No specialty comments available.  Procedures:  No procedures performed Allergies: Moxifloxacin, Quinolones, Doxycycline, Escitalopram oxalate, Lisinopril, and Sulfonamide derivatives    Assessment / Plan:     Visit  Diagnoses: Sarcoidosis - Progression of mediastinal and hilar lymph nodes on the CT scan which improved after prednisone, subjective history of EN lesions, right parotid swelling, lymph node biopsy negative for granulomatosis. Followed by Dr. Elsworth Soho: Overall the patient has clinically been doing well on methotrexate 0.8 mL subcu injections once weekly and folic acid 2 mg daily.  She has been tolerating methotrexate better with less fatigue and less nausea.  She has not missed any doses of methotrexate recently.  Overall she has noticed about a 75% improvement in her symptoms on methotrexate.  Her dry cough has resolved and her wheezing has been well controlled on Advair.  She is not experiencing any new or worsening pulmonary symptoms.  She has not yet seen ophthalmology.  On examination she has no signs of inflammatory arthritis currently.  She continues to experience pain and stiffness in both knee joints due to underlying osteoarthritis.  No warmth or effusion of the knee joints were noted on exam. Chest CT 12/23/2020: Interval improvement in bilateral pulmonary nodules most suggestive of benign process.  No new or enlarging pulmonary nodules or masses.  No enlarged mediastinal hilar lymph nodes noted. Discussed the importance of seeing her ophthalmologist and pulmonologist on a yearly basis.  She plans on calling to schedule appointments.  She will remain on methotrexate as prescribed.  A refill of methotrexate and folic acid will be sent to the pharmacy.  She will follow-up in the office in 5 months or sooner if needed.  Positive ANA (antinuclear antibody) - Positive ANA, positive sicca symptoms and oral ulcers. AVISE lupus index -1.1, ANA 1: 160NS, antiphosphatidylserine IgM positive.   Sialolithiasis - Maxillofacial CT obtained on 03/27/2021: Subtle inflammation of the right parotid gland compatible with mild sialoadenitis.  She has been using Biotene oral spray and drinking water throughout the day for  symptomatic relief.  She is not a good candidate for pilocarpine due to history of asthma.  High risk medication use - Methotrexate 0.40m sq injections once weekly and folic acid 280mby mouth daily CBC and CMP updated on 11/16/21.  She will be due to update lab work in October and every 3 months to monitor for drug toxicity. She has not had any recent or recurrent infections.  Discussed the importance of holding methotrexate if she develops signs or symptoms of an infection and to resume once the infection has completely cleared.   Primary osteoarthritis of both knees: Chronic pain and stiffness, right knee greater than left.  Slightly limited extension of the right knee with crepitus.  No warmth or effusion noted.  Offered to apply for Visco gel injections but she would like to hold off at this time.  Bilateral carpal tunnel syndrome: Asymptomatic at this time.  S/P bunionectomy -  left-Performed by Dr. WaJacqualyn Poseyn 09/08/21. Held MTX for about 6 weeks around the time of surgery.   Rash - Intermittent-both hands.  She uses clobetasol cream topically as needed.  A refill will be sent to the pharmacy today.  Other medical conditions are listed as follows:  Right lumbar radiculopathy: Resolved.  Chemotherapy-induced peripheral neuropathy (HCC)  Malignant neoplasm of both ovaries ([place]- 11/09/2014 TAHBSO, CTX X 6 months.  Previously received clearance to initiate immunosuppressive therapy.   Primary hypertension: Blood pressure is 125/83 today in the office.  History of gastroesophageal reflux (GERD)  Acquired hypothyroidism  Mild intermittent asthma with acute exacerbation: Prescribed Advair.  Migraine without aura and with status migrainosus, not intractable  OSA on CPAP  Other fatigue  Multiple lung nodules  Surgical menopause  TB lung, latent  Orders: Orders Placed This Encounter  Procedures   CBC with Differential/Platelet   COMPLETE METABOLIC PANEL WITH GFR   Meds  ordered this encounter  Medications   clobetasol cream (TEMOVATE) 0.05 %    Sig: Apply 1 Application topically 2 (two) times daily as needed.    Dispense:  45 g    Refill:  0   methotrexate 50 MG/2ML injection    Sig: Inject 0.8 mLs (20 mg total) into the skin once a week.    Dispense:  10 mL    Refill:  0   folic acid (FOLVITE) 1 MG tablet    Sig: Take 2 tablets (2 mg total) by mouth daily.    Dispense:  180 tablet    Refill:  3      Follow-Up Instructions: Return in about 5 months (around 06/28/2022) for Sarcoidosis, Osteoarthritis.   Ofilia Neas, PA-C  Note - This record has been created using Dragon software.  Chart creation errors have been sought, but may not always  have been located. Such creation errors do not reflect on  the standard of medical care.

## 2022-01-13 ENCOUNTER — Encounter (INDEPENDENT_AMBULATORY_CARE_PROVIDER_SITE_OTHER): Payer: Self-pay | Admitting: Family Medicine

## 2022-01-13 DIAGNOSIS — M170 Bilateral primary osteoarthritis of knee: Secondary | ICD-10-CM | POA: Insufficient documentation

## 2022-01-17 ENCOUNTER — Other Ambulatory Visit (HOSPITAL_BASED_OUTPATIENT_CLINIC_OR_DEPARTMENT_OTHER): Payer: Self-pay

## 2022-01-17 MED ORDER — SAXENDA 18 MG/3ML ~~LOC~~ SOPN
1.8000 mg | PEN_INJECTOR | Freq: Every day | SUBCUTANEOUS | 0 refills | Status: DC
  Filled 2022-01-17: qty 15, 50d supply, fill #0

## 2022-01-17 NOTE — Progress Notes (Signed)
Chief Complaint:   OBESITY [name] is here to discuss her progress with her obesity treatment plan along with follow-up of her obesity related diagnoses. [name] is on 1500 calories and 130-140 grams of protein daily And states she is following her eating plan approximately 90% of the time. [name] states she is not exercising.   Today's visit was #: 4 Starting weight: 333 lbs Starting date: 11/16/2021 Today's weight: 312 lbs Today's date: 01/11/2022 Total lbs lost to date: 21 lbs Total lbs lost since last in-office visit: 4 lbs  Interim History: has been more physically active.  Logging daily intake.  Denies meal skipping.  Getting in fruits and veggies.  Grilled out for labor day.  Moved Saxenda to 1.8 mg on Friday.  Feels more satiety.  Getting sour burps. Some mild nausea.  Working on increased water intake.    Subjective:   1. Prediabetes Last A1c 5.8 on 11/16/21.   2. Vitamin D deficiency Last Vitamin D level 21.3.  on prescription Vitamin D 50,000 IU weekly.   3. Osteoarthritis of both knees, unspecified osteoarthritis type Working on BMI reduction for total knee replacement.   Assessment/Plan:   1. Prediabetes Recheck in one month.   2. Vitamin D deficiency Refill - Vitamin D, Ergocalciferol, (DRISDOL) 1.25 MG (50000 UNIT) CAPS capsule; Take 1 capsule (50,000 Units total) by mouth every 7 (seven) days.  Dispense: 5 capsule; Refill: 0  3. Osteoarthritis of both knees, unspecified osteoarthritis type Continue plan of active weight loss.   4. Obesity, current BMI 48.9 Refill- - Liraglutide -Weight Management (SAXENDA) 18 MG/3ML SOPN; Inject 1.8 mg into the skin daily.  Dispense: 15 mL; Refill: 0  [name] is currently in the action stage of change. As such, her goal is to continue with weight loss efforts. She has agreed to keeping a food journal and adhering to recommended goals of 1500 calories and 120 protein.   Exercise goals:  as is.   Behavioral modification  strategies: increasing lean protein intake, increasing water intake, decreasing eating out, no skipping meals, better snacking choices, and decreasing junk food.  [name] has agreed to follow-up with our clinic in 3 weeks. She was informed of the importance of frequent follow-up visits to maximize her success with intensive lifestyle modifications for her multiple health conditions.   Objective:   Blood pressure 117/81, pulse 77, temperature 98.1 F (36.7 C), height '5\' 7"'$  (1.702 m), weight (!) 312 lb (141.5 kg), last menstrual period 11/02/2014, SpO2 97 %. Body mass index is 48.87 kg/m.  General: Cooperative, alert, well developed, in no acute distress. HEENT: Conjunctivae and lids unremarkable. Cardiovascular: Regular rhythm.  Lungs: Normal work of breathing. Neurologic: No focal deficits.   Lab Results  Component Value Date   CREATININE 0.80 11/16/2021   BUN 9 11/16/2021   NA 141 11/16/2021   K 4.3 11/16/2021   CL 101 11/16/2021   CO2 25 11/16/2021   Lab Results  Component Value Date   ALT 22 11/16/2021   AST 15 11/16/2021   ALKPHOS 68 11/16/2021   BILITOT 1.2 11/16/2021   Lab Results  Component Value Date   HGBA1C 5.8 (H) 11/16/2021   HGBA1C 5.8 06/25/2021   HGBA1C 5.7 10/16/2017   Lab Results  Component Value Date   INSULIN 17.5 11/16/2021   Lab Results  Component Value Date   TSH 5.360 (H) 11/16/2021   Lab Results  Component Value Date   CHOL 159 06/25/2021   HDL 38.90 (L) 06/25/2021  LDLCALC 92 06/25/2021   TRIG 140.0 06/25/2021   CHOLHDL 4 06/25/2021   Lab Results  Component Value Date   VD25OH 21.3 (L) 11/16/2021   VD25OH 13.51 (L) 06/25/2021   Lab Results  Component Value Date   WBC 9.4 11/16/2021   HGB 13.5 11/16/2021   HCT 40.6 11/16/2021   MCV 90 11/16/2021   PLT 280 11/16/2021   No results found for: "IRON", "TIBC", "FERRITIN"  Attestation Statements:   Reviewed by clinician on day of visit: allergies, medications, problem list,  medical history, surgical history, family history, social history, and previous encounter notes.  I, Davy Pique, am acting as Location manager for Loyal Gambler, DO.  I have reviewed the above documentation for accuracy and completeness, and I agree with the above. Dell Ponto, DO

## 2022-01-18 ENCOUNTER — Other Ambulatory Visit (HOSPITAL_BASED_OUTPATIENT_CLINIC_OR_DEPARTMENT_OTHER): Payer: Self-pay

## 2022-01-18 ENCOUNTER — Other Ambulatory Visit: Payer: Self-pay | Admitting: Internal Medicine

## 2022-01-18 ENCOUNTER — Ambulatory Visit (HOSPITAL_BASED_OUTPATIENT_CLINIC_OR_DEPARTMENT_OTHER): Payer: 59 | Admitting: Physical Therapy

## 2022-01-18 DIAGNOSIS — M25572 Pain in left ankle and joints of left foot: Secondary | ICD-10-CM

## 2022-01-18 DIAGNOSIS — M25672 Stiffness of left ankle, not elsewhere classified: Secondary | ICD-10-CM | POA: Diagnosis not present

## 2022-01-18 DIAGNOSIS — R2689 Other abnormalities of gait and mobility: Secondary | ICD-10-CM | POA: Diagnosis not present

## 2022-01-18 MED ORDER — LOSARTAN POTASSIUM 25 MG PO TABS
25.0000 mg | ORAL_TABLET | Freq: Every day | ORAL | 3 refills | Status: DC
  Filled 2022-01-18: qty 90, 90d supply, fill #0
  Filled 2022-04-15: qty 90, 90d supply, fill #1
  Filled 2022-07-15: qty 90, 90d supply, fill #2
  Filled 2022-10-19: qty 90, 90d supply, fill #3

## 2022-01-18 MED ORDER — AMPHETAMINE-DEXTROAMPHET ER 20 MG PO CP24
20.0000 mg | ORAL_CAPSULE | Freq: Every day | ORAL | 0 refills | Status: DC
  Filled 2022-01-18: qty 30, 30d supply, fill #0

## 2022-01-19 ENCOUNTER — Encounter (HOSPITAL_BASED_OUTPATIENT_CLINIC_OR_DEPARTMENT_OTHER): Payer: Self-pay | Admitting: Physical Therapy

## 2022-01-19 NOTE — Therapy (Addendum)
OUTPATIENT PHYSICAL THERAPY LOWER EXTREMITY TREATMENT NOTE/discharge    Patient Name: [name] MRN: [id number] DOB:September 04, 1970, 51 y.o., female Today's Date: 01/19/2022   PT End of Session - 01/19/22 0918     Visit Number 7    Number of Visits 12    Date for PT Re-Evaluation 01/18/22    PT Start Time 1600    PT Stop Time 2025    PT Time Calculation (min) 43 min    Activity Tolerance Patient tolerated treatment well    Behavior During Therapy [facility] for tasks assessed/performed                Past Medical History:  Diagnosis Date   Anxiety    Arthritis    Asthma    Back pain    Barrett's esophagus    Chemotherapy-induced neuropathy (HCC)    Chronic lower back pain    Depression    Dyspnea    Elevated blood pressure, situational 05/05/2016   Endometriosis    Exercise-induced asthma 09/08/2015   Fatigue    Gallbladder problem    GERD (gastroesophageal reflux disease)    Heartburn    History of kidney stones    Hypertension    Hypothyroidism    Infertility, female    Joint pain    Joint stiffness    Lower extremity edema    Migraine    ovarian cancer    Ovarian cancer, bilateral ([place]) 11/28/2014   PONV (postoperative nausea and vomiting)    Sarcoidosis    Sleep apnea    SOBOE (shortness of breath on exertion)    Thyroid disease    Umbilical hernia    Vitamin D deficiency    woke up while porta cath removed    Past Surgical History:  Procedure Laterality Date   ABDOMINAL HYSTERECTOMY  11/10/2014   at [facility], Exp lap, supracervical hyst, BSO, infracolic omentectomy, lymphadenectomy, aortic lymph node sampling   BIOPSY  11/04/2021   Procedure: BIOPSY;  Surgeon: Jackquline Denmark, MD;  Location: [facility];  Service: Gastroenterology;;   BRONCHIAL NEEDLE ASPIRATION BIOPSY  12/16/2020   Procedure: BRONCHIAL NEEDLE ASPIRATION BIOPSIES;  Surgeon: Rigoberto Noel, MD;  Location: [facility] ENDOSCOPY;  Service: Cardiopulmonary;;   BRONCHIAL WASHINGS  12/16/2020    Procedure: BRONCHIAL WASHINGS;  Surgeon: Rigoberto Noel, MD;  Location: [place];  Service: Cardiopulmonary;;   BUNIONECTOMY Left 09/14/2021   CHOLECYSTECTOMY N/A 07/24/2018   Procedure: LAPAROSCOPIC CHOLECYSTECTOMY WITH INTRAOPERATIVE CHOLANGIOGRAM;  Surgeon: Armandina Gemma, MD;  Location: [facility] ORS;  Service: General;  Laterality: N/A;   COLONOSCOPY WITH PROPOFOL N/A 11/04/2021   Procedure: COLONOSCOPY WITH PROPOFOL;  Surgeon: Jackquline Denmark, MD;  Location: [facility] ENDOS[facility];  Service: Gastroenterology;  Laterality: N/A;   ERCP N/A 07/26/2018   Procedure: ENDOSCOPIC RETROGRADE CHOLANGIOPANCREATOGRAPHY (ERCP);  Surgeon: Ladene Artist, MD;  Location: [facility];  Service: Endoscopy;  Laterality: N/A;   ESOPHAGOGASTRODUODENOSCOPY (EGD) WITH PROPOFOL N/A 11/04/2021   Procedure: ESOPHAGOGASTRODUODENOSCOPY (EGD) WITH PROPOFOL;  Surgeon: Jackquline Denmark, MD;  Location: [facility];  Service: Gastroenterology;  Laterality: N/A;   GANGLION CYST EXCISION Right    hand   HYSTERECTOMY ABDOMINAL WITH SALPINGO-OOPHORECTOMY  11/10/2014   ovarian cancer, tumor removal   INCISIONAL HERNIA REPAIR N/A 12/25/2020   Procedure: LAPAROSCOPIC INCISIONAL HERNIA REPAIR WITH MESH;  Surgeon: Kinsinger, Arta Bruce, MD;  Location: [facility];  Service: General;  Laterality: N/A;   IR GENERIC HISTORICAL  05/12/2016   IR REMOVAL TUN ACCESS W/ PORT W/O FL MOD SED 05/12/2016 [facility]-INTER[facility]  RAD   LAPAROSCOPIC ENDOMETRIOSIS FULGURATION     LIPOMA EXCISION Left    ankle   meniscal tear Right    MENISCUS REPAIR Right    POLYPECTOMY  11/04/2021   Procedure: POLYPECTOMY;  Surgeon: Jackquline Denmark, MD;  Location: [facility];  Service: Gastroenterology;;   Glori Luis a cath removal     PORTA CATH INSERTION     REMOVAL OF STONES  07/26/2018   Procedure: REMOVAL OF STONES;  Surgeon: Ladene Artist, MD;  Location: [facility];  Service: Endoscopy;;   SPHINCTEROTOMY  07/26/2018   Procedure: Joan Mayans;  Surgeon: Ladene Artist, MD;   Location[facility];  Service: Endoscopy;;   UMBILICAL HERNIA REPAIR  12/29/2020   VIDEO BRONCHOSCOPY WITH ENDOBRONCHIAL ULTRASOUND N/A 12/16/2020   Procedure: VIDEO BRONCHOSCOPY WITH ENDOBRONCHIAL ULTRASOUND;  Surgeon: Rigoberto Noel, MD;  Location: [place];  Service: Cardiopulmonary;  Laterality: N/A;   Patient Active Problem List   Diagnosis Date Noted   Osteoarthritis of both knees 01/13/2022   Hot flashes due to menopause 12/19/2021   ADD (attention deficit disorder) 12/19/2021   SOBOE (shortness of breath on exertion) 11/16/2021   Elevated glucose 11/16/2021   Vitamin D deficiency 11/16/2021   Prediabetes 11/16/2021   Bunion, left 08/06/2021   Morbid obesity ([place]) 06/26/2021   Venous insufficiency 06/25/2021   Incisional hernia 12/25/2020   Mediastinal lymphadenopathy    Pulmonary nodules 10/20/2020   Multiple skin nodules 07/24/2020   Viral illness 06/14/2020   Right lumbar radiculopathy 87/57/9728   Periumbilical hernia 20/60/1561   TB lung, latent 05/18/2020   Acute non-recurrent maxillary sinusitis 11/09/2019   HTN (hypertension) 04/14/2019   Mass of left wrist 04/14/2019   External otitis of right ear 04/14/2019   Choledocholithiasis    Abnormal cholangiogram    Cholecystitis with cholelithiasis 07/24/2018   Hyperglycemia 10/16/2017   Hot flash not due to menopause 10/16/2017   Eczema 10/16/2017   Flu-like symptoms 06/20/2017   Cognitive changes 01/30/2017   Disturbed concentration 01/26/2017   Left shoulder pain 11/01/2016   Right low back pain 11/01/2016   Elevated blood pressure, situational 05/05/2016   Surgical menopause 01/29/2016   OSA on CPAP 12/22/2015   Cough 11/26/2015   Capsulitis 10/06/2015   Ganglion cyst of left foot 10/06/2015   Fatigue 09/13/2015   Asthma 09/08/2015   Exertional dyspnea 09/08/2015   Peroneal ganglion cyst 07/20/2015   Low back pain 07/20/2015   Peroneal tendinitis of left lower leg 06/25/2015   Nonallopathic  lesion of cervical region 06/25/2015   Wheezing 05/08/2015   Polyarthralgia 05/08/2015   Peripheral edema 03/16/2015   Central line complication 53/79/4327   Morbid obesity with BMI of 45.0-49.9, adult ([place]) 02/28/2015   Chemotherapy induced neutropenia ([place]) 02/28/2015   Screening for colorectal cancer 01/05/2015   Chemotherapy induced nausea and vomiting 01/02/2015   Chemotherapy-induced peripheral neuropathy ([place]) 01/02/2015   Premature surgical menopause 12/17/2014   Leukopenia due to antineoplastic chemotherapy ([place]) 12/17/2014   Port-A-Cath in place 12/17/2014   Encounter for antineoplastic chemotherapy 12/17/2014   Myalgia 12/09/2014   Hypersensitivity reaction 12/05/2014   Poor venous access 11/28/2014   Postoperative cellulitis of surgical wound 11/24/2014   Tobacco dependence 11/12/2014   Malignant neoplasm of both ovaries 11/10/2014   Anxiety 11/10/2014   Adnexal mass 11/10/2014   Encounter for well adult exam with abnormal findings 09/10/2014   Hypothyroidism 09/10/2014   Hypersomnolence 09/10/2014   Acute non-recurrent frontal sinusitis 06/24/2014   Right knee pain 06/24/2014  Lower back pain 01/08/2014   EAR PAIN, BILATERAL 12/18/2009   KNEE PAIN, RIGHT 12/18/2009   INGROWN TOENAIL 12/17/2008   BACK PAIN 12/17/2008   Acute bronchospasm 11/11/2008   Depression 11/29/2007   Insomnia 11/29/2007   Allergic rhinitis 04/13/2007   GERD 04/13/2007   Endometriosis determined by laparoscopy 04/13/2007   Migraine headache 01/29/2007    PCP: Dr Ina Kick   REFERRING PROVIDER: Dr Annice Needy   REFERRING DIAG: Left foot bunionectomy ( Great Toe)   THERAPY DIAG:  Other abnormalities of gait and mobility  Pain in left ankle and joints of left foot  Stiffness of left ankle, not elsewhere classified  Rationale for Evaluation and Treatment Rehabilitation  ONSET DATE: 09/13/2021  SUBJECTIVE:   SUBJECTIVE STATEMENT: The patient reports she has felt good until  last Thursday. She went to a show and walked on uneven surfaces. She is no irritated. Her pain is on the interior of the ankle around her posterior tib area.   PERTINENT HISTORY: Anxiety, right knee arthritis; back pain, depression, dyspnea, chemo therapy induced neuropathy; LE edema, ovarian cancer ;   PAIN:  Are you having pain? Yes NPRS scale: 2/10  Pain location: left foot on top of the great toe and prox incision area Pain description: aching and burning  Aggravating factors: standing and walking  Relieving factors: not putting pressure on the foot   PRECAUTIONS: None  WEIGHT BEARING RESTRICTIONS No  FALLS:  Has patient fallen in last 6 months? No  LIVING ENVIRONMENT: Steps in the house.  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   PLOF: Independent  PATIENT GOALS : to have less pain/ to walk better/ To get back into the gym if able    OBJECTIVE:   DIAGNOSTIC FINDINGS:  X-rays: Nothing significant   PATIENT SURVEYS:  FOTO    COGNITION:  Overall cognitive status: Within functional limits for tasks assessed     SENSATION: Baseline neuropathy from chemo in 2016   EDEMA:  Swelling when she is up and on it  eg  POSTURE: No Significant postural limitations  PALPATION:   LOWER EXTREMITY ROM:  Active ROM Right eval Left eval  Hip flexion    Hip extension    Hip abduction    Hip adduction    Hip internal rotation    Hip external rotation    Knee flexion    Knee extension    Ankle dorsiflexion 0 13  Ankle plantarflexion 40   Ankle inversion WNL   Ankle eversion WNL     (Blank rows = not tested)  PROM  Right eval Left eval Right   Hip flexion     Hip extension     Hip abduction     Hip adduction     Hip internal rotation     Hip external rotation     Knee flexion     Knee extension     Ankle dorsiflexion 5 pulling on the bottom of the foot noted  15 13 With improved pain   Ankle plantarflexion WNL    Ankle inversion WNL    Ankle  eversion WNL      (Blank rows = not tested)  LOWER EXTREMITY MMT:  MMT Right eval Left eval Right  Left   Hip flexion 33.0 25.6 36.3 29.4  Hip extension      Hip abduction 32.6 35.5 40.1 43.1  Hip adduction      Hip internal rotation      Hip external  rotation      Knee flexion      Knee extension 17.4 39.5    Ankle dorsiflexion      Ankle plantarflexion      Ankle inversion      Ankle eversion       (Blank rows = not tested)  FUNCTIONAL TESTS:  Shifts away from right foot with sit to stand transfer  GAIT:  9/13 Improved lateral movement despite minor pain exacerbation today.    TODAY'S TREATMENT: 9/13 Reviwed tests and measures of strength  Reviewed HEP in the pool reviewed how to use her HEP at home  Reviewed the use of ice and   There-ex:  Seated heel raise with posterior tib activation x20 Seated ankle cricles x20  Seated Gastroc stretch with posterior tib stretch at the end 3x20 sec hold   Manual: reviewed self  soft tissue mobilization and TFM to posterior tib tendon; gentle stretichng into DF; TFM to posterior tib tendon.        9/5  Manual stretching gr toe into flex and extension   Pt seen for aquatic therapy today.  Treatment took place in water 3.25-4.5 ft in depth at the [facility]. Temp of water was 91.  Pt entered/exited the pool via stairs independently with bilat rail.   * forward walk with cues for heel strike and roll through 3 laps / lateral walking 2 laps; long stride 2 laps; march walk 2x10  * backwards walk, rolling through feet *Heel walking 2 widths forward 2 back *toe walking 80%submerged 4 widths *inversion walking x4 * Heel/toe raises ue supported yellow noodle *Straddling noodle: cycling with ankle df/pf; jumping jack motion LE; scissor jumps. *Stair climbing x 6 steps alternating le ue support on handrail x 3 indep *Squats bottom step x 10 *seated flutter kicking with ankle PF; seated add/abd with ankle df   *  Seated ankle inversion/everson x20 ankle heel toe x20; ankle circles    8/21 Pt seen for aquatic therapy today.  Treatment took place in water 3.25-4.5 ft in depth at the [facility]. Temp of water was 91.  Pt entered/exited the pool via stairs independently with bilat rail.  * forward walk with cues for heel strike and roll through 3 laps / lateral walking 2 laps; long stride 2 laps; march walk 2x10  * weight shifts forward- working on rolling through toes, then flat foot to D * backwards walk, rolling through feet * Seated ankle inversion/everson x20 ankle heel toe x20  * high knee marching with PF when knee flexed (somewhat painful at navicular) * ankle circles; return to walking forward/backward * Heel/toe raises holding wall (pain at top of R foot) * Step up on step 3'6 depth 2x10  * side to side lunge holding wall  * straddling yellow noodle:  bicycle motion; jumping jack motion * Holding rainbow hand buoys- squats 2x 10 (good stretch instep)   Pt requires the buoyancy and hydrostatic pressure of water for support, and to offload joints by unweighting joint load by at least 50 % in naval deep water and by at least 75-80% in chest to neck deep water.  Viscosity of the water is needed for resistance of strengthening. Water current perturbations provides challenge to standing balance requiring increased core activation.       PATIENT EDUCATION:  Education details: HEP; benefits of aquatics  Person educated: Patient Education method: Explanation, Demonstration, Tactile cues, Verbal cues, and Handouts Education comprehension: verbalized understanding, returned  demonstration, verbal cues required, tactile cues required, and needs further education   HOME EXERCISE PROGRAM: Access Code: [email or web address] URL: [email or web address] Date: 12/08/2021 Prepared by: Carolyne Littles  Exercises - Ankle Dorsiflexion with Resistance  - 1 x daily - 7 x weekly - 3 sets -  10 reps - Ankle and Toe Plantarflexion with Resistance  - 1 x daily - 7 x weekly - 3 sets - 10 reps - Long Sitting Calf Stretch with Strap  - 3 x daily - 7 x weekly - 1 sets - 3 reps - 20 hold - Seated Self Great Toe Mobilization  - 1 x daily - 7 x weekly - 3 sets - 10 reps   ASSESSMENT:  CLINICAL IMPRESSION: The patient is making good progress. She has had a recent set back which has increased her pain. She appears to have given herself a little posterior tib tendon. If she can get the pain under control using the techniques shown to her today, she will likely D/C to HEP. If it continues to linger around she may come back in for a follow up. She hopes to get back to the gym. She had a trainer before. She was advised that we can take her though some gym activity or she can with her trainer. Their is nothing that should exacerbate her pain much except running and she doesn't plan on doing that.     OBJECTIVE IMPAIRMENTS Abnormal gait, decreased activity tolerance, decreased endurance, decreased mobility, difficulty walking, decreased ROM, decreased strength, increased edema, and pain.   ACTIVITY LIMITATIONS lifting, bending, standing, squatting, stairs, transfers, and locomotion level  PARTICIPATION LIMITATIONS: meal prep, cleaning, shopping, community activity, occupation, and yard work  PERSONAL FACTORS Fitness and 1-2 comorbidities: low back pain and teral knee pain   are also affecting patient's functional outcome.   REHAB POTENTIAL: Good  CLINICAL DECISION MAKING: Evolving/moderate complexity  EVALUATION COMPLEXITY: High   GOALS: Goals reviewed with patient? Yes  SHORT TERM GOALS: Target date: 01/25/2022 9/13 assessed  Patient will increase active DF by 5 degrees  Baseline: Goal status: full Achieved   2.  Patient will demonstrate reduced lateral movement away from the great toe ambualting  Baseline:  Goal status: improved  in and out of the water  achieved   3.  Patient  will increase gross right LE strength by 5 lbs  Baseline:  Goal status: achieved    LONG TERM GOALS: Target date: 03/16/2022   Patient will stand for 1/2 hour without pain in order to perform ADL's  Baseline:  Goal status: INITIAL  2.  Patient will ambulate 3000' without pain in order to go grocery shopping without pain  Baseline:  Goal status: Achieved 9/5  3.  Patient will go up and down 6 steps without pain  Baseline:  Goal status: Achieved 9/5    PLAN: PT FREQUENCY: 2x/week  PT DURATION: 8 weeks  PLANNED INTERVENTIONS: Therapeutic exercises, Therapeutic activity, Neuromuscular re-education, Balance training, Gait training, Patient/Family education, Self Care, Joint mobilization, Aquatic Therapy, Dry Needling, Cryotherapy, Moist heat, Ultrasound, Ionotophoresis 61m/ml Dexamethasone, Manual therapy, and Re-evaluation  PLAN FOR NEXT SESSION: in the water work on normalizing gait; improving ankle strength, improve gross strength of the left leg; improve ability to weight bear.  PHYSICAL THERAPY DISCHARGE SUMMARY  Visits from Start of Care: 7   Current functional level related to goals / functional outcomes: Improved ability to ambulate    Remaining deficits: Still has pain at times   Education / Equipment: HEP  Patient agrees to discharge. Patient goals were met. Patient is being discharged due to meeting the stated rehab goals.   Carolyne Littles PT DPT  01/19/22 9:22 AM

## 2022-01-25 NOTE — Progress Notes (Unsigned)
Follow-up Visit   Date: 01/26/22    [name] MRN: [id number] DOB: 03-14-1971   Interim History: [name] is a 51 y.o. right-handed Caucasian female with ovarian cancer s/p bilateral SPO and hysterectomy and chemotherapy (11/2014),  hypothyroidism, endometriosis, migraines, and anxiety returning to the clinic for follow-up of chemotherapy-induced neuropathy, lumbar strain, and carpal tunnel syndrome.   IMPRESSION/PLAN: 1.  Bilateral carpal tunnel syndrome, stable. NCS/EMG showing moderate CTS bilaterally. She is not interested in surgery at this time  - Compliant with wearing wrist braces  2.  Chemotherapy-induced neuropathy (01/2015) affecting the feet  - Reduce gabapentin to '600mg'$  BID, ok to take an extra dose as needed  3.  Lumbar strain with possible some radicular paresthesias on the right  - Start PT for low back strengthening  - Continue flexeril '5mg'$  at bedtime  - If no improvement, the next step is MRI lumbar spine  Return to clinic in 6 months ------------------------------------------------------------------ UPDATE 01/26/2022:  She is here for follow-up visit.  Neuropathy is well-controlled on gabapentin '600mg'$  TID.  She admits that occasionally skipping the mid-day dose and does not notice a marked change.  No falls. She has ongoing hand paresthesias from CTS and uses wrist braces.  Her low back continues to be achy and she takes flexeril at bedtime.  She has noticed that occasionally, she has burning and tingling which radiates across the low back on the right side, it does not travel into the leg.    Medications:  Current Outpatient Medications on File Prior to Visit  Medication Sig Dispense Refill   ALPRAZolam (XANAX) 1 MG tablet Take 1 tablet (1 mg total) by mouth 2 (two) times daily as needed for anxiety. 60 tablet 2   amphetamine-dextroamphetamine (ADDERALL XR) 20 MG 24 hr capsule Take 1 capsule (20 mg total) by mouth daily. 30 capsule 0   Blood Pressure  Monitoring (OMRON 3 SERIES BP MONITOR) DEVI Use as directed. 1 each 0   buPROPion (WELLBUTRIN XL) 150 MG 24 hr tablet Take 1 tablet (150 mg total) by mouth daily. 90 tablet 3   clobetasol cream (TEMOVATE) 0.09 % Apply 1 Application topically 2 (two) times daily as needed. 45 g 0   estradiol (ESTRACE) 1 MG tablet Take 1 tablet (1 mg total) by mouth daily. 90 tablet 3   fluticasone (FLONASE) 50 MCG/ACT nasal spray Place 2 sprays into both nostrils daily. In each nostril (Patient taking differently: Place 2 sprays into both nostrils daily as needed for allergies. In each nostril) 16 g 1   fluticasone-salmeterol (ADVAIR DISKUS) 250-50 MCG/ACT AEPB Inhale 1 puff into the lungs in the morning and at bedtime. 381 each 3   folic acid (FOLVITE) 1 MG tablet Take 2 tablets (2 mg total) by mouth daily. 180 tablet 3   Insulin Pen Needle (B-D UF III MINI PEN NEEDLES) 31G X 5 MM MISC Use once daily as directed 100 each 0   levothyroxine (SYNTHROID) 100 MCG tablet Take 1 tablet (100 mcg total) by mouth daily. 90 tablet 3   Liraglutide -Weight Management (SAXENDA) 18 MG/3ML SOPN Inject 1.8 mg into the skin daily. 15 mL 0   losartan (COZAAR) 25 MG tablet Take 1 tablet (25 mg total) by mouth daily. 90 tablet 3   methotrexate 50 MG/2ML injection Inject 0.8 mLs (20 mg total) into the skin once a week. (Patient taking differently: Inject 20 mg into the skin every Friday.) 10 mL 0   omeprazole (PRILOSEC) 40 MG capsule  Take 1 capsule (40 mg total) by mouth daily. 30 capsule 3   polyvinyl alcohol (ARTIFICIAL TEARS) 1.4 % ophthalmic solution Place 1 drop into both eyes as needed for dry eyes. 15 mL 0   sertraline (ZOLOFT) 100 MG tablet TAKE 2 TABLETS (200 MG TOTAL) BY MOUTH DAILY. (Patient taking differently: Take 100 mg by mouth in the morning and at bedtime.) 180 tablet 3   traZODone (DESYREL) 50 MG tablet Take 0.5-1 tablets (25-50 mg total) by mouth at bedtime as needed for sleep. (Patient taking differently: Take 25-50 mg  by mouth at bedtime.) 90 tablet 1   Vitamin D, Ergocalciferol, (DRISDOL) 1.25 MG (50000 UNIT) CAPS capsule Take 1 capsule (50,000 Units total) by mouth every 7 (seven) days. 5 capsule 0   Zoster Vaccine Adjuvanted [facility]) injection Inject into the muscle. 0.5 mL 0   benzonatate (TESSALON) 100 MG capsule Take by mouth 3 (three) times daily as needed for cough. (Patient not taking: Reported on 01/26/2022)     No current facility-administered medications on file prior to visit.    Allergies:  Allergies  Allergen Reactions   Moxifloxacin Anaphylaxis    needs epinephrine shot   Quinolones Anaphylaxis, Shortness Of Breath and Swelling   Doxycycline Nausea And Vomiting   Escitalopram Oxalate Other (See Comments)     fatigue   Lisinopril Cough   Sulfonamide Derivatives Swelling and Rash    needs epinephrine shot--rash and lip swelling    Vital Signs:  BP 112/77   Pulse 89   Ht '5\' 7"'$  (1.702 m)   Wt (!) 310 lb (140.6 kg)   LMP 11/02/2014   SpO2 92%   BMI 48.55 kg/m   Neurological Exam: MENTAL STATUS including orientation to time, place, person, recent and remote memory, attention span and concentration, language, and fund of knowledge is normal.  Speech is not dysarthric.  CRANIAL NERVES:   Extraocular muscular muscles intact.  No ptosis.    MOTOR:  Motor strength shows 5/5 motor strength throughout.   MSRs:  Right                                                                 Left brachioradialis 2+  brachioradialis 2+  biceps 2+  biceps 2+  triceps 1+  triceps 1+  Patellar 1+  Patellar 1+  ankle jerk 0  ankle jerk 0  plantar response down  plantar response down   COORDINATION/GAIT:   Gait wide-based, stable. Unassited  Data: Lab Results  Component Value Date   TSH 5.360 (H) 11/16/2021   NCS/EMG of the upper extremities 11/24/2015:  Bilateral median neuropathy at or distal to the wrist, consistent with clinical diagnosis of carpal tunnel syndrome. Overall, these  findings are moderate in degree electrically    Thank you for allowing me to participate in patient's care.  If I can answer any additional questions, I would be pleased to do so.    Sincerely,    Eder Macek K. Posey Pronto, DO

## 2022-01-26 ENCOUNTER — Encounter: Payer: Self-pay | Admitting: Physician Assistant

## 2022-01-26 ENCOUNTER — Other Ambulatory Visit (HOSPITAL_BASED_OUTPATIENT_CLINIC_OR_DEPARTMENT_OTHER): Payer: Self-pay

## 2022-01-26 ENCOUNTER — Ambulatory Visit (INDEPENDENT_AMBULATORY_CARE_PROVIDER_SITE_OTHER): Payer: 59 | Admitting: Neurology

## 2022-01-26 ENCOUNTER — Ambulatory Visit: Payer: 59 | Attending: Physician Assistant | Admitting: Physician Assistant

## 2022-01-26 ENCOUNTER — Encounter: Payer: Self-pay | Admitting: Neurology

## 2022-01-26 VITALS — BP 125/83 | HR 74 | Resp 15 | Ht 67.0 in | Wt 311.4 lb

## 2022-01-26 VITALS — BP 112/77 | HR 89 | Ht 67.0 in | Wt 310.0 lb

## 2022-01-26 DIAGNOSIS — T451X5A Adverse effect of antineoplastic and immunosuppressive drugs, initial encounter: Secondary | ICD-10-CM

## 2022-01-26 DIAGNOSIS — Z9889 Other specified postprocedural states: Secondary | ICD-10-CM | POA: Diagnosis not present

## 2022-01-26 DIAGNOSIS — M5416 Radiculopathy, lumbar region: Secondary | ICD-10-CM

## 2022-01-26 DIAGNOSIS — G4733 Obstructive sleep apnea (adult) (pediatric): Secondary | ICD-10-CM

## 2022-01-26 DIAGNOSIS — G620 Drug-induced polyneuropathy: Secondary | ICD-10-CM

## 2022-01-26 DIAGNOSIS — D869 Sarcoidosis, unspecified: Secondary | ICD-10-CM | POA: Diagnosis not present

## 2022-01-26 DIAGNOSIS — R918 Other nonspecific abnormal finding of lung field: Secondary | ICD-10-CM

## 2022-01-26 DIAGNOSIS — K115 Sialolithiasis: Secondary | ICD-10-CM | POA: Diagnosis not present

## 2022-01-26 DIAGNOSIS — R5383 Other fatigue: Secondary | ICD-10-CM

## 2022-01-26 DIAGNOSIS — C563 Malignant neoplasm of bilateral ovaries: Secondary | ICD-10-CM

## 2022-01-26 DIAGNOSIS — J4521 Mild intermittent asthma with (acute) exacerbation: Secondary | ICD-10-CM

## 2022-01-26 DIAGNOSIS — G5603 Carpal tunnel syndrome, bilateral upper limbs: Secondary | ICD-10-CM

## 2022-01-26 DIAGNOSIS — I10 Essential (primary) hypertension: Secondary | ICD-10-CM

## 2022-01-26 DIAGNOSIS — Z79899 Other long term (current) drug therapy: Secondary | ICD-10-CM

## 2022-01-26 DIAGNOSIS — M170 Bilateral primary osteoarthritis of knee: Secondary | ICD-10-CM

## 2022-01-26 DIAGNOSIS — R768 Other specified abnormal immunological findings in serum: Secondary | ICD-10-CM | POA: Diagnosis not present

## 2022-01-26 DIAGNOSIS — Z9989 Dependence on other enabling machines and devices: Secondary | ICD-10-CM

## 2022-01-26 DIAGNOSIS — R7689 Other specified abnormal immunological findings in serum: Secondary | ICD-10-CM

## 2022-01-26 DIAGNOSIS — R21 Rash and other nonspecific skin eruption: Secondary | ICD-10-CM | POA: Diagnosis not present

## 2022-01-26 DIAGNOSIS — E039 Hypothyroidism, unspecified: Secondary | ICD-10-CM

## 2022-01-26 DIAGNOSIS — Z8719 Personal history of other diseases of the digestive system: Secondary | ICD-10-CM

## 2022-01-26 DIAGNOSIS — Z227 Latent tuberculosis: Secondary | ICD-10-CM

## 2022-01-26 DIAGNOSIS — E8940 Asymptomatic postprocedural ovarian failure: Secondary | ICD-10-CM

## 2022-01-26 DIAGNOSIS — G43001 Migraine without aura, not intractable, with status migrainosus: Secondary | ICD-10-CM

## 2022-01-26 MED ORDER — METHOTREXATE SODIUM CHEMO INJECTION 50 MG/2ML
20.0000 mg | INTRAMUSCULAR | 0 refills | Status: DC
  Filled 2022-01-26: qty 10, 84d supply, fill #0

## 2022-01-26 MED ORDER — CYCLOBENZAPRINE HCL 5 MG PO TABS
5.0000 mg | ORAL_TABLET | Freq: Every evening | ORAL | 3 refills | Status: DC | PRN
  Filled 2022-01-26: qty 90, 90d supply, fill #0
  Filled 2022-05-18: qty 90, 90d supply, fill #1

## 2022-01-26 MED ORDER — CLOBETASOL PROPIONATE 0.05 % EX CREA
1.0000 | TOPICAL_CREAM | Freq: Two times a day (BID) | CUTANEOUS | 0 refills | Status: DC | PRN
  Filled 2022-01-26: qty 45, 30d supply, fill #0

## 2022-01-26 MED ORDER — GABAPENTIN 600 MG PO TABS
600.0000 mg | ORAL_TABLET | Freq: Two times a day (BID) | ORAL | 3 refills | Status: DC
  Filled 2022-01-26 – 2022-02-10 (×2): qty 210, 105d supply, fill #0
  Filled 2022-02-17: qty 210, 70d supply, fill #0
  Filled 2022-05-11: qty 210, 70d supply, fill #1
  Filled 2022-07-20: qty 210, 70d supply, fill #2

## 2022-01-26 MED ORDER — FOLIC ACID 1 MG PO TABS
2.0000 mg | ORAL_TABLET | Freq: Every day | ORAL | 3 refills | Status: DC
  Filled 2022-01-26 – 2022-03-01 (×2): qty 180, 90d supply, fill #0
  Filled 2022-05-26 (×3): qty 180, 90d supply, fill #1
  Filled 2022-08-29: qty 180, 90d supply, fill #2
  Filled 2022-12-09: qty 180, 90d supply, fill #3

## 2022-01-26 NOTE — Patient Instructions (Signed)
Standing Labs We placed an order today for your standing lab work.   Please have your standing labs drawn in October and every 3 months    If possible, please have your labs drawn 2 weeks prior to your appointment so that the provider can discuss your results at your appointment.  Please note that you may see your imaging and lab results in MyChart before we have reviewed them. We may be awaiting multiple results to interpret others before contacting you. Please allow our office up to 72 hours to thoroughly review all of the results before contacting the office for clarification of your results.  We currently have open lab daily: Monday through Thursday from 1:30 PM-4:30 PM and Friday from 1:30 PM- 4:00 PM If possible, please come for your lab work on Monday, Thursday or Friday afternoons, as you may experience shorter wait times.   Effective March 09, 2022 the new lab hours will change to: Monday through Thursday from 1:30 PM-5:00 PM and Friday from 8:30 AM-12:00 PM If possible, please come for your lab work on Monday and Thursday afternoons, as you may experience shorter wait times.  Please be advised, all patients with office appointments requiring lab work will take precedent over walk-in lab work.    The office is located at [address], [address], [place], [place] [address] No appointment is necessary.   Labs are drawn by [facility]. Please bring your co-pay at the time of your lab draw.  You may receive a bill from [facility] for your lab work.  Please note if you are on Hydroxychloroquine and and an order has been placed for a Hydroxychloroquine level, you will need to have it drawn 4 hours or more after your last dose.  If you wish to have your labs drawn at another location, please call the office 24 hours in advance to send orders.  If you have any questions regarding directions or hours of operation,  please call [phone].   As a reminder, please drink plenty of water prior  to coming for your lab work. Thanks! .  If you have signs or symptoms of an infection or start antibiotics: First, call your PCP for workup of your infection. Hold your medication through the infection, until you complete your antibiotics, and until symptoms resolve if you take the following: Injectable medication (Actemra, Benlysta, Cimzia, Cosentyx, Enbrel, Humira, Kevzara, Orencia, Remicade, Simponi, Stelara, Taltz, Tremfya) Methotrexate Leflunomide (Arava) Mycophenolate (Cellcept) Xeljanz, Olumiant, or Rinvoq Vaccines You are taking a medication(s) that can suppress your immune system.  The following immunizations are recommended: Flu annually Covid-19  Td/Tdap (tetanus, diphtheria, pertussis) every 10 years Pneumonia (Prevnar 15 then Pneumovax 23 at least 1 year apart.  Alternatively, can take Prevnar 20 without needing additional dose) Shingrix: 2 doses from 4 weeks to 6 months apart  Please check with your PCP to make sure you are up to date.   

## 2022-01-26 NOTE — Patient Instructions (Signed)
Start physical therapy for low back strengthening  Reduce gabapentin to '600mg'$  twice daily  Continue flexeril '5mg'$  at bedtime  Return to clinic in 6 months

## 2022-01-27 ENCOUNTER — Ambulatory Visit: Payer: 59 | Admitting: Physician Assistant

## 2022-02-07 ENCOUNTER — Encounter: Payer: Self-pay | Admitting: Podiatry

## 2022-02-07 ENCOUNTER — Ambulatory Visit (INDEPENDENT_AMBULATORY_CARE_PROVIDER_SITE_OTHER): Payer: 59

## 2022-02-07 ENCOUNTER — Ambulatory Visit (INDEPENDENT_AMBULATORY_CARE_PROVIDER_SITE_OTHER): Payer: 59 | Admitting: Podiatry

## 2022-02-07 DIAGNOSIS — M2011 Hallux valgus (acquired), right foot: Secondary | ICD-10-CM | POA: Diagnosis not present

## 2022-02-07 DIAGNOSIS — M2012 Hallux valgus (acquired), left foot: Secondary | ICD-10-CM

## 2022-02-07 DIAGNOSIS — M25372 Other instability, left ankle: Secondary | ICD-10-CM

## 2022-02-07 DIAGNOSIS — Z9889 Other specified postprocedural states: Secondary | ICD-10-CM

## 2022-02-08 ENCOUNTER — Other Ambulatory Visit (HOSPITAL_BASED_OUTPATIENT_CLINIC_OR_DEPARTMENT_OTHER): Payer: Self-pay

## 2022-02-08 ENCOUNTER — Encounter (INDEPENDENT_AMBULATORY_CARE_PROVIDER_SITE_OTHER): Payer: Self-pay | Admitting: Family Medicine

## 2022-02-08 ENCOUNTER — Ambulatory Visit (INDEPENDENT_AMBULATORY_CARE_PROVIDER_SITE_OTHER): Payer: 59 | Admitting: Family Medicine

## 2022-02-08 VITALS — BP 126/83 | HR 77 | Temp 98.4°F | Ht 67.0 in | Wt 305.0 lb

## 2022-02-08 DIAGNOSIS — Z6841 Body Mass Index (BMI) 40.0 and over, adult: Secondary | ICD-10-CM | POA: Diagnosis not present

## 2022-02-08 DIAGNOSIS — E669 Obesity, unspecified: Secondary | ICD-10-CM

## 2022-02-08 DIAGNOSIS — R7303 Prediabetes: Secondary | ICD-10-CM

## 2022-02-08 DIAGNOSIS — M79672 Pain in left foot: Secondary | ICD-10-CM | POA: Diagnosis not present

## 2022-02-08 DIAGNOSIS — E559 Vitamin D deficiency, unspecified: Secondary | ICD-10-CM

## 2022-02-08 MED ORDER — VITAMIN D (ERGOCALCIFEROL) 1.25 MG (50000 UNIT) PO CAPS
50000.0000 [IU] | ORAL_CAPSULE | ORAL | 0 refills | Status: DC
  Filled 2022-02-08: qty 5, 35d supply, fill #0

## 2022-02-08 MED ORDER — BD PEN NEEDLE MINI U/F 31G X 5 MM MISC
0 refills | Status: DC
  Filled 2022-02-08 – 2022-03-01 (×2): qty 100, 90d supply, fill #0

## 2022-02-08 MED ORDER — SAXENDA 18 MG/3ML ~~LOC~~ SOPN
2.4000 mg | PEN_INJECTOR | Freq: Every day | SUBCUTANEOUS | 0 refills | Status: DC
  Filled 2022-02-08 – 2022-02-10 (×2): qty 15, 37d supply, fill #0
  Filled 2022-03-01: qty 15, 30d supply, fill #0

## 2022-02-10 ENCOUNTER — Other Ambulatory Visit: Payer: Self-pay | Admitting: Internal Medicine

## 2022-02-10 ENCOUNTER — Other Ambulatory Visit (HOSPITAL_BASED_OUTPATIENT_CLINIC_OR_DEPARTMENT_OTHER): Payer: Self-pay

## 2022-02-10 ENCOUNTER — Encounter (HOSPITAL_BASED_OUTPATIENT_CLINIC_OR_DEPARTMENT_OTHER): Payer: Self-pay | Admitting: Pharmacist

## 2022-02-10 MED ORDER — AMPHETAMINE-DEXTROAMPHET ER 20 MG PO CP24
20.0000 mg | ORAL_CAPSULE | Freq: Every day | ORAL | 0 refills | Status: DC
  Filled 2022-02-10 – 2022-02-22 (×3): qty 30, 30d supply, fill #0

## 2022-02-13 NOTE — Progress Notes (Signed)
Subjective: Chief Complaint  Patient presents with   Routine Post Op    DOS 09/08/2021 LT FOOT SURGICAL CORRECTION OF BUNION, LAPIDUS POSS AIKEN    "When I am on it awhile it bothers me"    [name] is a 51 y.o. is seen today in office s/p left foot Lapidus bunionectomy preformed on 09/08/2021.  Michela Pitcher that she is doing better.  She states when he is on her feet a lot it does bother her but seems to be getting better.  She is try to increase her activity.  No recent injuries.  No fevers or chills.  No other concerns.    Objective: General: No acute distress, AAOx3  DP/PT pulses palpable 2/4, CRT < 3 sec to all digits.  Protective sensation intact. Motor function intact.  Left foot: Incision is well coapted without any evidence of dehiscence.  Scar is formed.  There is some some slight edema but overall improved.  There is no erythema.  She does get some tenderness on the lateral ankle complex, sinus tarsi.  No area pinpoint tenderness. No pain with calf compression, swelling, warmth, erythema.   Assessment and Plan:  Status post left foot Lapidus bunionectomy; ankle instability  -Treatment options discussed including all alternatives, risks, and complications -X-rays obtained reviewed.  Hardware intact any complicating factors.  Hardware intact with any complicating factors. No evidence of acute fracture.  -At this time will continue PT and gradual increase activity as tolerated.  Continue with supportive shoe gear.  Consider steroid injection in the ankle if needed.  Trula Slade DPM

## 2022-02-14 ENCOUNTER — Other Ambulatory Visit: Payer: Self-pay | Admitting: Internal Medicine

## 2022-02-14 ENCOUNTER — Other Ambulatory Visit (HOSPITAL_BASED_OUTPATIENT_CLINIC_OR_DEPARTMENT_OTHER): Payer: Self-pay

## 2022-02-14 DIAGNOSIS — F419 Anxiety disorder, unspecified: Secondary | ICD-10-CM

## 2022-02-14 MED ORDER — ALPRAZOLAM 1 MG PO TABS
1.0000 mg | ORAL_TABLET | Freq: Two times a day (BID) | ORAL | 2 refills | Status: DC | PRN
  Filled 2022-02-14: qty 60, 30d supply, fill #0
  Filled 2022-04-15: qty 60, 30d supply, fill #1
  Filled 2022-05-18: qty 60, 30d supply, fill #2

## 2022-02-16 DIAGNOSIS — M79672 Pain in left foot: Secondary | ICD-10-CM | POA: Insufficient documentation

## 2022-02-16 NOTE — Progress Notes (Signed)
Chief Complaint:   OBESITY [name] is here to discuss her progress with her obesity treatment plan along with follow-up of her obesity related diagnoses. [name] is on keeping a food journal and adhering to recommended goals of 1500 calories and 120  protein and states she is following her eating plan approximately 75% of the time. [name] states she is walking 15 minutes 3 times per week.  Today's visit was #: 5 Starting weight: 333 lbs Starting date: 11/16/2021 Today's weight: 305 lbs Today's date: 01/11/2022 Total lbs lost to date: 28 lbs Total lbs lost since last in-office visit: 7 lbs  Interim History: starting to see more room in clothes.  Denies meal skipping.  She has been seeing an increase in hunger and cravings.  Working a lot and has had more carbohydrates at work. Tries to bring food in, protein shake in the morning.  Support at home is better.  Meal prepping with husband.  Eating out less.    Subjective:   1. Left foot pain Recovering from surgery.  Pain is limiting walking. She is able to swim and ride her bike.   2. Prediabetes Last A1c 5.8.  She is working on reducing sugar intake.   3. Vitamin D deficiency Vitamin D level in July 21.3.  She is on prescription Vitamin D 50,000 IU weekly.   Assessment/Plan:   1. Left foot pain We discussed consistent exercise with less foot pounding.    2. Prediabetes Reviewed food label handout to reduce intake of added sugar.  Recheck A1c next visit.   3. Vitamin D deficiency Refill - Vitamin D, Ergocalciferol, (DRISDOL) 1.25 MG (50000 UNIT) CAPS capsule; Take 1 capsule (50,000 Units total) by mouth every 7 (seven) days.  Dispense: 5 capsule; Refill: 0  4. Obesity, current BMI 47.8 Increase - Liraglutide -Weight Management (SAXENDA) 18 MG/3ML SOPN; Inject 2.4 mg into the skin daily.  Dispense: 15 mL; Refill: 0  Refill - Insulin Pen Needle (B-D UF III MINI PEN NEEDLES) 31G X 5 MM MISC; Use once daily as directed   Dispense: 100 each; Refill: 0  [name] is currently in the action stage of change. As such, her goal is to continue with weight loss efforts. She has agreed to keeping a food journal and adhering to recommended goals of 1500 calories and 120 protein.   Exercise goals:  20 minutes 5 times per week.   Behavioral modification strategies: increasing lean protein intake, increasing vegetables, increasing water intake, decreasing eating out, no skipping meals, meal planning and cooking strategies, keeping healthy foods in the home, emotional eating strategies, and decreasing junk food.  [name] has agreed to follow-up with our clinic in 4 weeks. She was informed of the importance of frequent follow-up visits to maximize her success with intensive lifestyle modifications for her multiple health conditions.   Objective:   Blood pressure 126/83, pulse 77, temperature 98.4 F (36.9 C), height '5\' 7"'$  (1.702 m), weight (!) 305 lb (138.3 kg), last menstrual period 11/02/2014, SpO2 97 %. Body mass index is 47.77 kg/m.  General: Cooperative, alert, well developed, in no acute distress. HEENT: Conjunctivae and lids unremarkable. Cardiovascular: Regular rhythm.  Lungs: Normal work of breathing. Neurologic: No focal deficits.   Lab Results  Component Value Date   CREATININE 0.80 11/16/2021   BUN 9 11/16/2021   NA 141 11/16/2021   K 4.3 11/16/2021   CL 101 11/16/2021   CO2 25 11/16/2021   Lab Results  Component Value Date  ALT 22 11/16/2021   AST 15 11/16/2021   ALKPHOS 68 11/16/2021   BILITOT 1.2 11/16/2021   Lab Results  Component Value Date   HGBA1C 5.8 (H) 11/16/2021   HGBA1C 5.8 06/25/2021   HGBA1C 5.7 10/16/2017   Lab Results  Component Value Date   INSULIN 17.5 11/16/2021   Lab Results  Component Value Date   TSH 5.360 (H) 11/16/2021   Lab Results  Component Value Date   CHOL 159 06/25/2021   HDL 38.90 (L) 06/25/2021   LDLCALC 92 06/25/2021   TRIG 140.0 06/25/2021    CHOLHDL 4 06/25/2021   Lab Results  Component Value Date   VD25OH 21.3 (L) 11/16/2021   VD25OH 13.51 (L) 06/25/2021   Lab Results  Component Value Date   WBC 9.4 11/16/2021   HGB 13.5 11/16/2021   HCT 40.6 11/16/2021   MCV 90 11/16/2021   PLT 280 11/16/2021   No results found for: "IRON", "TIBC", "FERRITIN"  Attestation Statements:   Reviewed by clinician on day of visit: allergies, medications, problem list, medical history, surgical history, family history, social history, and previous encounter notes.  I, Davy Pique, am acting as Location manager for Loyal Gambler, DO.  I have reviewed the above documentation for accuracy and completeness, and I agree with the above. Dell Ponto, DO

## 2022-02-17 ENCOUNTER — Other Ambulatory Visit (HOSPITAL_BASED_OUTPATIENT_CLINIC_OR_DEPARTMENT_OTHER): Payer: Self-pay

## 2022-02-22 ENCOUNTER — Other Ambulatory Visit (HOSPITAL_BASED_OUTPATIENT_CLINIC_OR_DEPARTMENT_OTHER): Payer: Self-pay

## 2022-02-25 ENCOUNTER — Telehealth: Payer: Self-pay | Admitting: Podiatry

## 2022-02-25 NOTE — Telephone Encounter (Signed)
Tried calling pt to let her know her orthotics are in and to schedule her an appointment to pick them up. Unable to leave a voicemail due to her mailbox being full. Sending message via MyChart for pt to call our office.

## 2022-03-01 ENCOUNTER — Other Ambulatory Visit (HOSPITAL_BASED_OUTPATIENT_CLINIC_OR_DEPARTMENT_OTHER): Payer: Self-pay

## 2022-03-09 ENCOUNTER — Other Ambulatory Visit (HOSPITAL_BASED_OUTPATIENT_CLINIC_OR_DEPARTMENT_OTHER): Payer: Self-pay

## 2022-03-09 ENCOUNTER — Encounter (INDEPENDENT_AMBULATORY_CARE_PROVIDER_SITE_OTHER): Payer: Self-pay | Admitting: Family Medicine

## 2022-03-09 ENCOUNTER — Ambulatory Visit (INDEPENDENT_AMBULATORY_CARE_PROVIDER_SITE_OTHER): Payer: 59 | Admitting: Family Medicine

## 2022-03-09 VITALS — BP 133/82 | HR 76 | Temp 98.1°F | Ht 67.0 in | Wt 304.0 lb

## 2022-03-09 DIAGNOSIS — F329 Major depressive disorder, single episode, unspecified: Secondary | ICD-10-CM | POA: Diagnosis not present

## 2022-03-09 DIAGNOSIS — E559 Vitamin D deficiency, unspecified: Secondary | ICD-10-CM

## 2022-03-09 DIAGNOSIS — Z6841 Body Mass Index (BMI) 40.0 and over, adult: Secondary | ICD-10-CM | POA: Diagnosis not present

## 2022-03-09 DIAGNOSIS — R7303 Prediabetes: Secondary | ICD-10-CM

## 2022-03-09 DIAGNOSIS — E669 Obesity, unspecified: Secondary | ICD-10-CM | POA: Diagnosis not present

## 2022-03-09 DIAGNOSIS — R632 Polyphagia: Secondary | ICD-10-CM | POA: Diagnosis not present

## 2022-03-09 DIAGNOSIS — E66813 Obesity, class 3: Secondary | ICD-10-CM

## 2022-03-09 MED ORDER — SAXENDA 18 MG/3ML ~~LOC~~ SOPN
3.0000 mg | PEN_INJECTOR | Freq: Every day | SUBCUTANEOUS | 0 refills | Status: DC
  Filled 2022-03-09 – 2022-03-25 (×3): qty 15, 30d supply, fill #0

## 2022-03-09 MED ORDER — VITAMIN D (ERGOCALCIFEROL) 1.25 MG (50000 UNIT) PO CAPS
50000.0000 [IU] | ORAL_CAPSULE | ORAL | 0 refills | Status: DC
  Filled 2022-03-09: qty 5, 35d supply, fill #0

## 2022-03-15 ENCOUNTER — Telehealth (INDEPENDENT_AMBULATORY_CARE_PROVIDER_SITE_OTHER): Payer: Self-pay | Admitting: *Deleted

## 2022-03-15 NOTE — Telephone Encounter (Signed)
Prior authorization done via cover my meds for patients Saxenda. Gennie Alma (Key: [id number]) Kirke Shaggy '18MG'$ Fayne Mediate pen-injectors   Form [employer] ePA Form 2017 NCPDPaiting on determination.

## 2022-03-16 NOTE — Telephone Encounter (Signed)
Prior Authorization approved for patients Saxenda. 03/15/2022 to 04/14/2022

## 2022-03-17 ENCOUNTER — Other Ambulatory Visit (HOSPITAL_BASED_OUTPATIENT_CLINIC_OR_DEPARTMENT_OTHER): Payer: Self-pay

## 2022-03-21 ENCOUNTER — Ambulatory Visit: Payer: 59 | Admitting: Podiatry

## 2022-03-22 ENCOUNTER — Other Ambulatory Visit (HOSPITAL_BASED_OUTPATIENT_CLINIC_OR_DEPARTMENT_OTHER): Payer: Self-pay

## 2022-03-22 NOTE — Progress Notes (Signed)
Chief Complaint:   OBESITY [name] is here to discuss her progress with her obesity treatment plan along with follow-up of her obesity related diagnoses. [name] is on keeping a food journal and adhering to recommended goals of 1500 calories and 120 protein and states she is following her eating plan approximately 50% of the time. [name] states she is not exercising.   Today's visit was #: 6 Starting weight: 333 lbs Starting date: 11/16/2021 Today's weight: 304 lbs Today's date: 03/09/2022 Total lbs lost to date: 29 lbs Total lbs lost since last in-office visit: 1 lb  Interim History: Starting to see more cravings and is bored with the meal plan.  Eating out more.  Husband wants bigger meals at dinner.  Less consistent with exercise. She is on Saxenda 2.4 mg daily for 4 weeks.  Seeing less satiety.  Has more stress and less sleep.   Subjective:   1. Pre-diabetes Last A1c 5.8.  Still craving sugar but has cut back.    2. Vitamin D deficiency She is currently taking prescription vitamin D 50,000 IU each week. She denies nausea, vomiting or muscle weakness. Last Vitamin D level 21.3.    3. Polyphagia She is on Saxenda 2.4 mg daily >4 weeks.  She denies any adverse side effects.    4. Major depressive disorder, remission status unspecified, unspecified whether recurrent She is on Wellbutrin XL and Sertraline 200 mg daily with a stable mood.  Has increased stress at work and home.   Assessment/Plan:   1. Pre-diabetes Continue a low sugar, low starch diet.  Recheck A1c in one month.   2. Vitamin D deficiency Recheck Vitamin D level in one month.   Refill - Vitamin D, Ergocalciferol, (DRISDOL) 1.25 MG (50000 UNIT) CAPS capsule; Take 1 capsule (50,000 Units total) by mouth every 7 (seven) days.  Dispense: 5 capsule; Refill: 0  3. Polyphagia Increased Saxenda to 3 mg daily.   Increase - Liraglutide -Weight Management (SAXENDA) 18 MG/3ML SOPN; Inject 3 mg into the skin daily.   Dispense: 15 mL; Refill: 0  4. Major depressive disorder, remission status unspecified, unspecified whether recurrent Discussed referral to Dr Mallie Mussel for CBT.   5. Obesity, current BMI 47.7 1) Change to journaling.  2) Discussed eating dinners (some) apart from husband.  - Liraglutide -Weight Management (SAXENDA) 18 MG/3ML SOPN; Inject 3 mg into the skin daily.  Dispense: 15 mL; Refill: 0  [name] is currently in the action stage of change. As such, her goal is to continue with weight loss efforts. She has agreed to keeping a food journal and adhering to recommended goals of 1500-1700 calories and 120 protein daily.   Exercise goals:  Walking 30 minutes 3 times per week.   Behavioral modification strategies: increasing lean protein intake, increasing vegetables, increasing water intake, decreasing eating out, no skipping meals, meal planning and cooking strategies, keeping healthy foods in the home, and decreasing junk food.  [name] has agreed to follow-up with our clinic in 3-4 weeks. She was informed of the importance of frequent follow-up visits to maximize her success with intensive lifestyle modifications for her multiple health conditions.   Objective:   Blood pressure 133/82, pulse 76, temperature 98.1 F (36.7 C), height '5\' 7"'$  (1.702 m), weight (!) 304 lb (137.9 kg), last menstrual period 11/02/2014, SpO2 98 %. Body mass index is 47.61 kg/m.  General: Cooperative, alert, well developed, in no acute distress. HEENT: Conjunctivae and lids unremarkable. Cardiovascular: Regular rhythm.  Lungs: Normal  work of breathing. Neurologic: No focal deficits.   Lab Results  Component Value Date   CREATININE 0.80 11/16/2021   BUN 9 11/16/2021   NA 141 11/16/2021   K 4.3 11/16/2021   CL 101 11/16/2021   CO2 25 11/16/2021   Lab Results  Component Value Date   ALT 22 11/16/2021   AST 15 11/16/2021   ALKPHOS 68 11/16/2021   BILITOT 1.2 11/16/2021   Lab Results  Component Value  Date   HGBA1C 5.8 (H) 11/16/2021   HGBA1C 5.8 06/25/2021   HGBA1C 5.7 10/16/2017   Lab Results  Component Value Date   INSULIN 17.5 11/16/2021   Lab Results  Component Value Date   TSH 5.360 (H) 11/16/2021   Lab Results  Component Value Date   CHOL 159 06/25/2021   HDL 38.90 (L) 06/25/2021   LDLCALC 92 06/25/2021   TRIG 140.0 06/25/2021   CHOLHDL 4 06/25/2021   Lab Results  Component Value Date   VD25OH 21.3 (L) 11/16/2021   VD25OH 13.51 (L) 06/25/2021   Lab Results  Component Value Date   WBC 9.4 11/16/2021   HGB 13.5 11/16/2021   HCT 40.6 11/16/2021   MCV 90 11/16/2021   PLT 280 11/16/2021   No results found for: "IRON", "TIBC", "FERRITIN"  Attestation Statements:   Reviewed by clinician on day of visit: allergies, medications, problem list, medical history, surgical history, family history, social history, and previous encounter notes.  I, Davy Pique, am acting as Location manager for Loyal Gambler, DO.  I have reviewed the above documentation for accuracy and completeness, and I agree with the above. Dell Ponto, DO

## 2022-03-25 ENCOUNTER — Other Ambulatory Visit (HOSPITAL_BASED_OUTPATIENT_CLINIC_OR_DEPARTMENT_OTHER): Payer: Self-pay

## 2022-03-25 ENCOUNTER — Other Ambulatory Visit: Payer: Self-pay | Admitting: Internal Medicine

## 2022-03-25 MED ORDER — AMPHETAMINE-DEXTROAMPHET ER 20 MG PO CP24
20.0000 mg | ORAL_CAPSULE | Freq: Every day | ORAL | 0 refills | Status: DC
  Filled 2022-03-25: qty 30, 30d supply, fill #0

## 2022-04-05 ENCOUNTER — Other Ambulatory Visit (HOSPITAL_BASED_OUTPATIENT_CLINIC_OR_DEPARTMENT_OTHER): Payer: Self-pay

## 2022-04-05 ENCOUNTER — Encounter (INDEPENDENT_AMBULATORY_CARE_PROVIDER_SITE_OTHER): Payer: Self-pay | Admitting: Family Medicine

## 2022-04-05 ENCOUNTER — Ambulatory Visit (INDEPENDENT_AMBULATORY_CARE_PROVIDER_SITE_OTHER): Payer: 59 | Admitting: Family Medicine

## 2022-04-05 VITALS — BP 122/83 | HR 87 | Temp 97.6°F | Ht 67.0 in | Wt 302.0 lb

## 2022-04-05 DIAGNOSIS — R632 Polyphagia: Secondary | ICD-10-CM | POA: Diagnosis not present

## 2022-04-05 DIAGNOSIS — E669 Obesity, unspecified: Secondary | ICD-10-CM

## 2022-04-05 DIAGNOSIS — R7303 Prediabetes: Secondary | ICD-10-CM

## 2022-04-05 DIAGNOSIS — E559 Vitamin D deficiency, unspecified: Secondary | ICD-10-CM

## 2022-04-05 DIAGNOSIS — Z6841 Body Mass Index (BMI) 40.0 and over, adult: Secondary | ICD-10-CM | POA: Diagnosis not present

## 2022-04-05 MED ORDER — SAXENDA 18 MG/3ML ~~LOC~~ SOPN
3.0000 mg | PEN_INJECTOR | Freq: Every day | SUBCUTANEOUS | 0 refills | Status: DC
  Filled 2022-04-05 – 2022-04-24 (×2): qty 15, 30d supply, fill #0

## 2022-04-05 MED ORDER — VITAMIN D (ERGOCALCIFEROL) 1.25 MG (50000 UNIT) PO CAPS
50000.0000 [IU] | ORAL_CAPSULE | ORAL | 0 refills | Status: DC
  Filled 2022-04-05 – 2022-04-24 (×2): qty 5, 35d supply, fill #0

## 2022-04-13 ENCOUNTER — Ambulatory Visit: Payer: 59 | Admitting: Neurology

## 2022-04-14 ENCOUNTER — Ambulatory Visit: Payer: 59 | Admitting: Podiatry

## 2022-04-14 NOTE — Progress Notes (Signed)
Chief Complaint:   OBESITY [name] is here to discuss her progress with her obesity treatment plan along with follow-up of her obesity related diagnoses. [name] is on keeping a food journal and adhering to recommended goals of 1500-1700 calories and 120 protein and states she is following her eating plan approximately 50% of the time. [name] states she is not exercising.  Today's visit was #: 7 Starting weight: 333 LBS Starting date: 11/16/2021 Today's weight: 302 LBS Today's date: 04/05/2022 Total lbs lost to date: 31 LBS Total lbs lost since last in-office visit: 2 LBS  Interim History: Struggling with more hunger and cravings.  She is taking Saxenda 3 mg daily injection.  She denies meals skipping.  She has tried to hit protein goals each day.  She has tried protein shakes and bars for snack.  Subjective:   1. Polyphagia She is taking Saxenda, it was increased to 3 mg once a day injection 4 weeks ago.  She has improved satiety without any GI side effects.  2. Pre-diabetes On 11/16/2021, A1c was 5.8.  She is doing well reducing sugar intake.  3. Vitamin D deficiency She is currently taking prescription vitamin D 50,000 IU each week. She denies nausea, vomiting or muscle weakness.  On 11/16/2021, vitamin D level was 21.3.  Assessment/Plan:   1. Polyphagia Refill- Liraglutide -Weight Management (SAXENDA) 18 MG/3ML SOPN; Inject 3 mg into the skin daily.  Dispense: 15 mL; Refill: 0  2. Pre-diabetes Discussed ways to increase exercise.  We will recheck A1c in December.  3. Vitamin D deficiency Recheck level in December.  Refill- Vitamin D, Ergocalciferol, (DRISDOL) 1.25 MG (50000 UNIT) CAPS capsule; Take 1 capsule (50,000 Units total) by mouth every 7 (seven) days.  Dispense: 5 capsule; Refill: 0  4. Obesity, current BMI 47.3 She has gained 2 pounds of muscle and has lost 3 pounds of body fat between visits.  Refill- Liraglutide -Weight Management (SAXENDA) 18 MG/3ML SOPN;  Inject 3 mg into the skin daily.  Dispense: 15 mL; Refill: 0  [name] is currently in the action stage of change. As such, her goal is to continue with weight loss efforts. She has agreed to keeping a food journal and adhering to recommended goals of 1600 calories and 120 protein daily.  Exercise goals:  Add gym 2 times per week  Behavioral modification strategies: increasing lean protein intake, increasing vegetables, increasing water intake, decreasing eating out, no skipping meals, meal planning and cooking strategies, keeping healthy foods in the home, and decreasing junk food.  [name] has agreed to follow-up with our clinic in 4 weeks. She was informed of the importance of frequent follow-up visits to maximize her success with intensive lifestyle modifications for her multiple health conditions.   Objective:   Blood pressure 122/83, pulse 87, temperature 97.6 F (36.4 C), height '5\' 7"'$  (1.702 m), weight (!) 302 lb (137 kg), last menstrual period 11/02/2014, SpO2 100 %. Body mass index is 47.3 kg/m.  General: Cooperative, alert, well developed, in no acute distress. HEENT: Conjunctivae and lids unremarkable. Cardiovascular: Regular rhythm.  Lungs: Normal work of breathing. Neurologic: No focal deficits.   Lab Results  Component Value Date   CREATININE 0.80 11/16/2021   BUN 9 11/16/2021   NA 141 11/16/2021   K 4.3 11/16/2021   CL 101 11/16/2021   CO2 25 11/16/2021   Lab Results  Component Value Date   ALT 22 11/16/2021   AST 15 11/16/2021   ALKPHOS 68 11/16/2021  BILITOT 1.2 11/16/2021   Lab Results  Component Value Date   HGBA1C 5.8 (H) 11/16/2021   HGBA1C 5.8 06/25/2021   HGBA1C 5.7 10/16/2017   Lab Results  Component Value Date   INSULIN 17.5 11/16/2021   Lab Results  Component Value Date   TSH 5.360 (H) 11/16/2021   Lab Results  Component Value Date   CHOL 159 06/25/2021   HDL 38.90 (L) 06/25/2021   LDLCALC 92 06/25/2021   TRIG 140.0 06/25/2021    CHOLHDL 4 06/25/2021   Lab Results  Component Value Date   VD25OH 21.3 (L) 11/16/2021   VD25OH 13.51 (L) 06/25/2021   Lab Results  Component Value Date   WBC 9.4 11/16/2021   HGB 13.5 11/16/2021   HCT 40.6 11/16/2021   MCV 90 11/16/2021   PLT 280 11/16/2021   No results found for: "IRON", "TIBC", "FERRITIN"  Attestation Statements:   Reviewed by clinician on day of visit: allergies, medications, problem list, medical history, surgical history, family history, social history, and previous encounter notes.  I, Davy Pique, am acting as Location manager for Loyal Gambler, DO.  I have reviewed the above documentation for accuracy and completeness, and I agree with the above. Dell Ponto, DO

## 2022-04-15 NOTE — Telephone Encounter (Signed)
Left message on vm for patient to call back to set up appt to pick up orthotics.

## 2022-04-16 ENCOUNTER — Other Ambulatory Visit: Payer: Self-pay

## 2022-04-17 ENCOUNTER — Other Ambulatory Visit: Payer: Self-pay

## 2022-04-19 ENCOUNTER — Other Ambulatory Visit: Payer: Self-pay | Admitting: Physician Assistant

## 2022-04-19 ENCOUNTER — Other Ambulatory Visit: Payer: Self-pay | Admitting: Internal Medicine

## 2022-04-19 ENCOUNTER — Encounter (HOSPITAL_BASED_OUTPATIENT_CLINIC_OR_DEPARTMENT_OTHER): Payer: Self-pay | Admitting: Pharmacist

## 2022-04-19 ENCOUNTER — Other Ambulatory Visit (HOSPITAL_BASED_OUTPATIENT_CLINIC_OR_DEPARTMENT_OTHER): Payer: Self-pay

## 2022-04-19 MED ORDER — TRAZODONE HCL 50 MG PO TABS
25.0000 mg | ORAL_TABLET | Freq: Every evening | ORAL | 0 refills | Status: DC | PRN
  Filled 2022-04-19: qty 90, 90d supply, fill #0

## 2022-04-20 ENCOUNTER — Other Ambulatory Visit (HOSPITAL_BASED_OUTPATIENT_CLINIC_OR_DEPARTMENT_OTHER): Payer: Self-pay

## 2022-04-21 ENCOUNTER — Encounter: Payer: Self-pay | Admitting: Oncology

## 2022-04-23 ENCOUNTER — Encounter: Payer: Self-pay | Admitting: Oncology

## 2022-04-24 ENCOUNTER — Encounter (HOSPITAL_BASED_OUTPATIENT_CLINIC_OR_DEPARTMENT_OTHER): Payer: Self-pay | Admitting: Pharmacist

## 2022-04-24 ENCOUNTER — Other Ambulatory Visit: Payer: Self-pay | Admitting: Internal Medicine

## 2022-04-24 ENCOUNTER — Other Ambulatory Visit (HOSPITAL_BASED_OUTPATIENT_CLINIC_OR_DEPARTMENT_OTHER): Payer: Self-pay

## 2022-04-24 MED ORDER — AMPHETAMINE-DEXTROAMPHET ER 20 MG PO CP24
20.0000 mg | ORAL_CAPSULE | Freq: Every day | ORAL | 0 refills | Status: DC
  Filled 2022-04-24: qty 30, 30d supply, fill #0

## 2022-04-25 ENCOUNTER — Other Ambulatory Visit (HOSPITAL_BASED_OUTPATIENT_CLINIC_OR_DEPARTMENT_OTHER): Payer: Self-pay

## 2022-04-25 ENCOUNTER — Telehealth (INDEPENDENT_AMBULATORY_CARE_PROVIDER_SITE_OTHER): Payer: Self-pay | Admitting: *Deleted

## 2022-04-25 NOTE — Telephone Encounter (Signed)
New prior authorization needed for patients Saxenda. Done via cover my meds. Guy Begin (KeyEdrick Oh) Rx #: [id number] Saxenda '18MG'$ Fayne Mediate pen-injectors

## 2022-04-26 ENCOUNTER — Other Ambulatory Visit (HOSPITAL_BASED_OUTPATIENT_CLINIC_OR_DEPARTMENT_OTHER): Payer: Self-pay

## 2022-05-10 ENCOUNTER — Encounter: Payer: Self-pay | Admitting: Oncology

## 2022-05-10 ENCOUNTER — Other Ambulatory Visit: Payer: Self-pay | Admitting: *Deleted

## 2022-05-10 DIAGNOSIS — Z79899 Other long term (current) drug therapy: Secondary | ICD-10-CM

## 2022-05-11 ENCOUNTER — Other Ambulatory Visit: Payer: Self-pay

## 2022-05-11 ENCOUNTER — Other Ambulatory Visit (HOSPITAL_BASED_OUTPATIENT_CLINIC_OR_DEPARTMENT_OTHER): Payer: Self-pay

## 2022-05-11 ENCOUNTER — Other Ambulatory Visit: Payer: Self-pay | Admitting: Physician Assistant

## 2022-05-11 LAB — CBC WITH DIFFERENTIAL/PLATELET
Absolute Monocytes: 560 cells/uL (ref 200–950)
Basophils Absolute: 10 cells/uL (ref 0–200)
Basophils Relative: 0.1 %
Eosinophils Absolute: 130 cells/uL (ref 15–500)
Eosinophils Relative: 1.3 %
HCT: 40.4 % (ref 35.0–45.0)
Hemoglobin: 13.8 g/dL (ref 11.7–15.5)
Lymphs Abs: 2180 cells/uL (ref 850–3900)
MCH: 30.9 pg (ref 27.0–33.0)
MCHC: 34.2 g/dL (ref 32.0–36.0)
MCV: 90.4 fL (ref 80.0–100.0)
MPV: 10.1 fL (ref 7.5–12.5)
Monocytes Relative: 5.6 %
Neutro Abs: 7120 cells/uL (ref 1500–7800)
Neutrophils Relative %: 71.2 %
Platelets: 289 10*3/uL (ref 140–400)
RBC: 4.47 10*6/uL (ref 3.80–5.10)
RDW: 12.5 % (ref 11.0–15.0)
Total Lymphocyte: 21.8 %
WBC: 10 10*3/uL (ref 3.8–10.8)

## 2022-05-11 LAB — COMPLETE METABOLIC PANEL WITH GFR
AG Ratio: 1.6 (calc) (ref 1.0–2.5)
ALT: 16 U/L (ref 6–29)
AST: 15 U/L (ref 10–35)
Albumin: 4.4 g/dL (ref 3.6–5.1)
Alkaline phosphatase (APISO): 58 U/L (ref 37–153)
BUN: 8 mg/dL (ref 7–25)
CO2: 29 mmol/L (ref 20–32)
Calcium: 9.5 mg/dL (ref 8.6–10.4)
Chloride: 103 mmol/L (ref 98–110)
Creat: 0.72 mg/dL (ref 0.50–1.03)
Globulin: 2.7 g/dL (calc) (ref 1.9–3.7)
Glucose, Bld: 86 mg/dL (ref 65–99)
Potassium: 4 mmol/L (ref 3.5–5.3)
Sodium: 141 mmol/L (ref 135–146)
Total Bilirubin: 0.7 mg/dL (ref 0.2–1.2)
Total Protein: 7.1 g/dL (ref 6.1–8.1)
eGFR: 101 mL/min/{1.73_m2} (ref >=60)

## 2022-05-11 MED ORDER — METHOTREXATE SODIUM CHEMO INJECTION 50 MG/2ML
20.0000 mg | INTRAMUSCULAR | 0 refills | Status: DC
  Filled 2022-05-11: qty 10, 84d supply, fill #0

## 2022-05-11 MED ORDER — OMEPRAZOLE 40 MG PO CPDR
40.0000 mg | DELAYED_RELEASE_CAPSULE | Freq: Every day | ORAL | 3 refills | Status: DC
  Filled 2022-05-11 – 2022-05-18 (×2): qty 30, 30d supply, fill #0
  Filled 2022-06-18 – 2022-06-19 (×2): qty 30, 30d supply, fill #1
  Filled 2022-07-20: qty 30, 30d supply, fill #2
  Filled 2022-08-15: qty 30, 30d supply, fill #3

## 2022-05-11 NOTE — Telephone Encounter (Signed)
Next Visit: 06/28/2022  Last Visit: 01/26/2022  Last Fill: 01/26/2022  DX: Sarcoidosis   Current Dose per office note 01/26/2022: methotrexate 0.8 mL subcu injections once weekly   Labs: 05/10/2022 CBC and CMP WNL   Okay to refill MTX?

## 2022-05-11 NOTE — Progress Notes (Signed)
CBC and CMP WNL

## 2022-05-17 ENCOUNTER — Other Ambulatory Visit (HOSPITAL_BASED_OUTPATIENT_CLINIC_OR_DEPARTMENT_OTHER): Payer: Self-pay

## 2022-05-17 ENCOUNTER — Encounter (INDEPENDENT_AMBULATORY_CARE_PROVIDER_SITE_OTHER): Payer: Self-pay | Admitting: Family Medicine

## 2022-05-17 ENCOUNTER — Ambulatory Visit (INDEPENDENT_AMBULATORY_CARE_PROVIDER_SITE_OTHER): Payer: Commercial Managed Care - PPO | Admitting: Family Medicine

## 2022-05-17 VITALS — BP 122/85 | HR 78 | Temp 97.8°F | Ht 67.0 in | Wt 299.0 lb

## 2022-05-17 DIAGNOSIS — E559 Vitamin D deficiency, unspecified: Secondary | ICD-10-CM

## 2022-05-17 DIAGNOSIS — R7303 Prediabetes: Secondary | ICD-10-CM

## 2022-05-17 DIAGNOSIS — Z6841 Body Mass Index (BMI) 40.0 and over, adult: Secondary | ICD-10-CM | POA: Diagnosis not present

## 2022-05-17 DIAGNOSIS — E039 Hypothyroidism, unspecified: Secondary | ICD-10-CM

## 2022-05-17 DIAGNOSIS — R632 Polyphagia: Secondary | ICD-10-CM

## 2022-05-17 DIAGNOSIS — E669 Obesity, unspecified: Secondary | ICD-10-CM

## 2022-05-17 MED ORDER — WEGOVY 1.7 MG/0.75ML ~~LOC~~ SOAJ
1.7000 mg | SUBCUTANEOUS | 0 refills | Status: DC
  Filled 2022-05-17: qty 3, 28d supply, fill #0

## 2022-05-17 NOTE — Telephone Encounter (Signed)
Left message on voicemail for pt to call back to pick up orthotics .  Charges need put in for orthotics

## 2022-05-18 ENCOUNTER — Other Ambulatory Visit: Payer: Self-pay

## 2022-05-18 ENCOUNTER — Other Ambulatory Visit (HOSPITAL_BASED_OUTPATIENT_CLINIC_OR_DEPARTMENT_OTHER): Payer: Self-pay

## 2022-05-18 DIAGNOSIS — E88819 Insulin resistance, unspecified: Secondary | ICD-10-CM | POA: Insufficient documentation

## 2022-05-19 ENCOUNTER — Other Ambulatory Visit: Payer: Self-pay

## 2022-05-24 NOTE — Telephone Encounter (Signed)
Saxenda Approved.

## 2022-05-25 ENCOUNTER — Other Ambulatory Visit (HOSPITAL_BASED_OUTPATIENT_CLINIC_OR_DEPARTMENT_OTHER): Payer: Self-pay

## 2022-05-26 ENCOUNTER — Telehealth (INDEPENDENT_AMBULATORY_CARE_PROVIDER_SITE_OTHER): Payer: Self-pay | Admitting: *Deleted

## 2022-05-26 ENCOUNTER — Other Ambulatory Visit (HOSPITAL_BASED_OUTPATIENT_CLINIC_OR_DEPARTMENT_OTHER): Payer: Self-pay

## 2022-05-26 ENCOUNTER — Encounter: Payer: Self-pay | Admitting: Oncology

## 2022-05-26 NOTE — Telephone Encounter (Signed)
Prior authorization done for patients [employer]. Waiting on determination.

## 2022-05-27 ENCOUNTER — Encounter: Payer: Self-pay | Admitting: Oncology

## 2022-05-27 ENCOUNTER — Other Ambulatory Visit (HOSPITAL_BASED_OUTPATIENT_CLINIC_OR_DEPARTMENT_OTHER): Payer: Self-pay

## 2022-05-27 ENCOUNTER — Other Ambulatory Visit: Payer: Self-pay | Admitting: Internal Medicine

## 2022-05-27 MED ORDER — AMPHETAMINE-DEXTROAMPHET ER 20 MG PO CP24
20.0000 mg | ORAL_CAPSULE | Freq: Every day | ORAL | 0 refills | Status: DC
  Filled 2022-05-27: qty 30, 30d supply, fill #0

## 2022-05-31 NOTE — Telephone Encounter (Signed)
Prior authorization approved for patients Wegovy.

## 2022-06-06 NOTE — Progress Notes (Unsigned)
Chief Complaint:   OBESITY [name] is here to discuss her progress with her obesity treatment plan along with follow-up of her obesity related diagnoses. [name] is on keeping a food journal and adhering to recommended goals of 1600 calories and 120 gms  protein daily and states she is following her eating plan approximately 70% of the time. [name] states she has not been exercising.  Today's visit was #: 8 Starting weight: 333 lbs Starting date: 11/16/21 Today's weight: 299 lbs Today's date: 05/17/22 Total lbs lost to date: 34 Total lbs lost since last in-office visit: -3  Interim History: Has a membership at [facility] but has not been going.  Dad is staying with her and working long hours.  Plans to go back to school and is thinking about changing jobs.  Not logging.  Felt to constrictive on category 3 meal plan.  Subjective:   1. Polyphagia Worsened by consumption of refined carbs/excess sugar.  2. Pre-diabetes A1c 5.8 11/16/21.  Has lost 34 pounds equals 10.2% total body weight loss since starting program.  Actively reducing intake of sugar.  3. Vitamin D deficiency Last vitamin D level 21.3 11/16/21. Taking prescription vitamin D 50,000 IU weekly.  Assessment/Plan:   1. Polyphagia Discontinue Saxenda 3 mg daily injection. Changed to Wegovy: - Semaglutide-Weight Management (WEGOVY) 1.7 MG/0.75ML SOAJ; Inject 1.7 mg into the skin once a week.  Dispense: 3 mL; Refill: 0  2. Pre-diabetes A1c level ordered-to be drawn next visit. - Hemoglobin A1c Add in 30 minutes of walking 3-4 times per week.  3. Vitamin D deficiency Recheck vitamin D level. - VITAMIN D 25 Hydroxy (Vit-D Deficiency, Fractures) Did not need refill of prescription vitamin D.  4. Obesity,current BMI 46.9 1.  Changed to portion control smart chores plan-handout given. 2.  Increase water intake.  - TSH Rfx on Abnormal to Free T4 - Semaglutide-Weight Management (WEGOVY) 1.7 MG/0.75ML SOAJ;  Inject 1.7 mg into the skin once a week.  Dispense: 3 mL; Refill: 0  [name] is currently in the action stage of change. As such, her goal is to continue with weight loss efforts. She has agreed to practicing portion control and making smarter food choices, such as increasing vegetables and decreasing simple carbohydrates.  110 g of protein daily.  Exercise goals: Add in 30 minutes of walking 3-4 times per week.  Behavioral modification strategies: increasing lean protein intake, increasing vegetables, increasing water intake, decreasing eating out, no skipping meals, meal planning and cooking strategies, keeping healthy foods in the home, and planning for success.  [name] has agreed to follow-up with our clinic in 4 weeks. She was informed of the importance of frequent follow-up visits to maximize her success with intensive lifestyle modifications for her multiple health conditions.   [name] was informed we would discuss her lab results at her next visit unless there is a critical issue that needs to be addressed sooner. [name] agreed to keep her next visit at the agreed upon time to discuss these results.  Objective:   Blood pressure 122/85, pulse 78, temperature 97.8 F (36.6 C), height '5\' 7"'$  (1.702 m), weight 299 lb (135.6 kg), last menstrual period 11/02/2014, SpO2 97 %. Body mass index is 46.83 kg/m.  General: Cooperative, alert, well developed, in no acute distress. HEENT: Conjunctivae and lids unremarkable. Cardiovascular: Regular rhythm.  Lungs: Normal work of breathing. Neurologic: No focal deficits.   Lab Results  Component Value Date   CREATININE 0.72 05/10/2022   BUN  8 05/10/2022   NA 141 05/10/2022   K 4.0 05/10/2022   CL 103 05/10/2022   CO2 29 05/10/2022   Lab Results  Component Value Date   ALT 16 05/10/2022   AST 15 05/10/2022   ALKPHOS 68 11/16/2021   BILITOT 0.7 05/10/2022   Lab Results  Component Value Date   HGBA1C 5.8 (H) 11/16/2021   HGBA1C 5.8  06/25/2021   HGBA1C 5.7 10/16/2017   Lab Results  Component Value Date   INSULIN 17.5 11/16/2021   Lab Results  Component Value Date   TSH 5.360 (H) 11/16/2021   Lab Results  Component Value Date   CHOL 159 06/25/2021   HDL 38.90 (L) 06/25/2021   LDLCALC 92 06/25/2021   TRIG 140.0 06/25/2021   CHOLHDL 4 06/25/2021   Lab Results  Component Value Date   VD25OH 21.3 (L) 11/16/2021   VD25OH 13.51 (L) 06/25/2021   Lab Results  Component Value Date   WBC 10.0 05/10/2022   HGB 13.8 05/10/2022   HCT 40.4 05/10/2022   MCV 90.4 05/10/2022   PLT 289 05/10/2022   No results found for: "IRON", "TIBC", "FERRITIN"   Attestation Statements:   Reviewed by clinician on day of visit: allergies, medications, problem list, medical history, surgical history, family history, social history, and previous encounter notes.  I, Georgianne Fick, FNP, am acting as transcriptionist for Dr. Loyal Gambler.  I have reviewed the above documentation for accuracy and completeness, and I agree with the above. -  ***

## 2022-06-07 ENCOUNTER — Encounter: Payer: Self-pay | Admitting: Internal Medicine

## 2022-06-07 DIAGNOSIS — L609 Nail disorder, unspecified: Secondary | ICD-10-CM

## 2022-06-14 NOTE — Progress Notes (Signed)
Office Visit Note  Patient: [name]             Date of Birth: 12-29-1970           MRN: [id number]             PCP: Biagio Borg, MD Referring: Biagio Borg, MD Visit Date: 06/28/2022 Occupation: @GUAROCC$ @  Subjective:  Pain in joints and blurred vision   History of Present Illness: [name] is a 52 y.o. female with history of sarcoidosis and osteoarthritis. Patient continues to have arthritic pain in her hands, knees and hips bilaterally. States the left [facility] joint is the most bothersome for her. She also has history of carpal tunnel syndrome and she complains of more weakness and numbness in her hands that is worse at night. She occasionally uses braces. She has been evaluated by Dr. Posey Pronto in neurology for carpal tunnel syndrome. For the sarcoidosis, she is on methotrexate 20 mg injections weekly and follows up with Dr. Elsworth Soho. She missed one dose of methotrexate last week for a ingrown toenail surgery and plans to resume treatment this Friday. She has not had any flares. One month ago she developed a rash on her face that lasted for day. No new exposure to allergens. She denies any shortness of breath. She also reports new blurry vision. She has not been evaluated by ophthalmology recently.  Activities of Daily Living:  Patient reports morning stiffness for 2-3 hours.   Patient Reports nocturnal pain.  Difficulty dressing/grooming: Denies Difficulty climbing stairs: Reports Difficulty getting out of chair: Denies Difficulty using hands for taps, buttons, cutlery, and/or writing: Denies  Review of Systems  Constitutional:  Positive for fatigue.  HENT:  Positive for mouth sores and mouth dryness.   Eyes:  Positive for dryness.  Respiratory:  Negative for shortness of breath.   Cardiovascular:  Negative for chest pain and palpitations.  Gastrointestinal:  Positive for constipation and diarrhea. Negative for blood in stool.  Endocrine: Negative for increased urination.   Genitourinary:  Negative for involuntary urination.  Musculoskeletal:  Positive for joint pain, joint pain, joint swelling and morning stiffness. Negative for gait problem, myalgias, muscle weakness, muscle tenderness and myalgias.  Skin:  Negative for color change, rash, hair loss and sensitivity to sunlight.  Allergic/Immunologic: Positive for susceptible to infections.  Neurological:  Positive for dizziness. Negative for headaches.  Hematological:  Negative for swollen glands.  Psychiatric/Behavioral:  Positive for depressed mood. Negative for sleep disturbance. The patient is nervous/anxious.     PMFS History:  Patient Active Problem List   Diagnosis Date Noted   Blurry vision, bilateral 06/28/2022   Osteoarthritis, hand 06/28/2022   Hoarseness 06/18/2022   Insulin resistance 05/18/2022   Major depressive disorder 03/09/2022   Polyphagia 03/09/2022   Left foot pain 02/16/2022   Osteoarthritis of both knees 01/13/2022   Hot flashes due to menopause 12/19/2021   ADD (attention deficit disorder) 12/19/2021   SOBOE (shortness of breath on exertion) 11/16/2021   Elevated glucose 11/16/2021   Vitamin D deficiency 11/16/2021   Prediabetes 11/16/2021   Bunion, left 08/06/2021   Morbid obesity ([place]) 06/26/2021   Venous insufficiency 06/25/2021   Incisional hernia 12/25/2020   Mediastinal lymphadenopathy    Pulmonary nodules 10/20/2020   Multiple skin nodules 07/24/2020   Right lumbar radiculopathy [id number]   Periumbilical hernia [id number]   TB lung, latent 05/18/2020   Acute non-recurrent maxillary sinusitis 11/09/2019   HTN (hypertension) 04/14/2019   Mass of left wrist  04/14/2019   External otitis of right ear 04/14/2019   Choledocholithiasis    Abnormal cholangiogram    Cholecystitis with cholelithiasis 07/24/2018   Hot flash not due to menopause 10/16/2017   Eczema 10/16/2017   Flu-like symptoms 06/20/2017   Cognitive changes 01/30/2017   Disturbed concentration  01/26/2017   Left shoulder pain 11/01/2016   Right low back pain 11/01/2016   Elevated blood pressure, situational 05/05/2016   Surgical menopause 01/29/2016   OSA on CPAP 12/22/2015   Cough 11/26/2015   Capsulitis 10/06/2015   Ganglion cyst of left foot 10/06/2015   Fatigue 09/13/2015   Asthma 09/08/2015   Exertional dyspnea 09/08/2015   Peroneal ganglion cyst 07/20/2015   Low back pain 07/20/2015   Peroneal tendinitis of left lower leg 06/25/2015   Nonallopathic lesion of cervical region 06/25/2015   Wheezing 05/08/2015   Polyarthralgia 05/08/2015   Peripheral edema 03/16/2015   Central line complication [id number]   Chemotherapy induced neutropenia ([place]) 02/28/2015   Screening for colorectal cancer 01/05/2015   Chemotherapy induced nausea and vomiting 01/02/2015   Chemotherapy-induced peripheral neuropathy ([place]) 01/02/2015   Premature surgical menopause 12/17/2014   Leukopenia due to antineoplastic chemotherapy ([place]) 12/17/2014   Port-A-Cath in place 12/17/2014   Encounter for antineoplastic chemotherapy 12/17/2014   Myalgia 12/09/2014   Hypersensitivity reaction 12/05/2014   Poor venous access 11/28/2014   Postoperative cellulitis of surgical wound 11/24/2014   Malignant neoplasm of both ovaries 11/10/2014   Anxiety 11/10/2014   Adnexal mass 11/10/2014   Encounter for well adult exam with abnormal findings 09/10/2014   Hypothyroidism 09/10/2014   Hypersomnolence 09/10/2014   Acute non-recurrent frontal sinusitis 06/24/2014   Right knee pain 06/24/2014   Lower back pain 01/08/2014   EAR PAIN, BILATERAL 12/18/2009   KNEE PAIN, RIGHT 12/18/2009   INGROWN TOENAIL 12/17/2008   BACK PAIN 12/17/2008   Acute bronchospasm 11/11/2008   Depression 11/29/2007   Insomnia 11/29/2007   Allergic rhinitis 04/13/2007   GERD 04/13/2007   Endometriosis determined by laparoscopy 04/13/2007   Migraine headache 01/29/2007    Past Medical History:  Diagnosis Date   Anxiety     Arthritis    Asthma    Back pain    Barrett's esophagus    Chemotherapy-induced neuropathy ([facility])    Chronic lower back pain    Depression    Dyspnea    Elevated blood pressure, situational 05/05/2016   Endometriosis    Exercise-induced asthma 09/08/2015   Fatigue    Gallbladder problem    GERD (gastroesophageal reflux disease)    Heartburn    History of kidney stones    Hypertension    Hypothyroidism    Infertility, female    Joint pain    Joint stiffness    Lower extremity edema    Migraine    ovarian cancer    Ovarian cancer, bilateral ([place]) 11/28/2014   PONV (postoperative nausea and vomiting)    Sarcoidosis    Sleep apnea    SOBOE (shortness of breath on exertion)    Thyroid disease    Umbilical hernia    Vitamin D deficiency    woke up while porta cath removed     Family History  Problem Relation Age of Onset   Hyperlipidemia Mother    Hypertension Mother    Diabetes Mother    Stroke Mother        Deceased   Heart disease Mother    Osteoarthritis Mother    Atrial fibrillation Father  Living   Stroke Father        optic nerve left eye   Healthy Brother    Heart failure Maternal Grandmother    Heart attack Maternal Grandfather    Dementia Paternal Grandmother    Dementia Paternal Grandfather    Diabetes Maternal Aunt    Past Surgical History:  Procedure Laterality Date   ABDOMINAL HYSTERECTOMY  11/10/2014   at [facility], Exp lap, supracervical hyst, BSO, infracolic omentectomy, lymphadenectomy, aortic lymph node sampling   BIOPSY  11/04/2021   Procedure: BIOPSY;  Surgeon: Jackquline Denmark, MD;  Location: [facility];  Service: Gastroenterology;;   BRONCHIAL NEEDLE ASPIRATION BIOPSY  12/16/2020   Procedure: BRONCHIAL NEEDLE ASPIRATION BIOPSIES;  Surgeon: Rigoberto Noel, MD;  Location: [place];  Service: Cardiopulmonary;;   BRONCHIAL WASHINGS  12/16/2020   Procedure: BRONCHIAL WASHINGS;  Surgeon: Rigoberto Noel, MD;  Location: [place];  Service:  Cardiopulmonary;;   BUNIONECTOMY Left 09/14/2021   CHOLECYSTECTOMY N/A 07/24/2018   Procedure: LAPAROSCOPIC CHOLECYSTECTOMY WITH INTRAOPERATIVE CHOLANGIOGRAM;  Surgeon: Armandina Gemma, MD;  [facility]: [facility];  Service: General;  Laterality: N/A;   COLONOSCOPY WITH PROPOFOL N/A 11/04/2021   Procedure: COLONOSCOPY WITH PROPOFOL;  Surgeon: Jackquline Denmark, MD;  [facility]: [facility];  Service: Gastroenterology;  Laterality: N/A;   ERCP N/A 07/26/2018   Procedure: ENDOSCOPIC RETROGRADE CHOLANGIOPANCREATOGRAPHY (ERCP);  Surgeon: Ladene Artist, MD;  [facility];  Service: Endoscopy;  Laterality: N/A;   ESOPHAGOGASTRODUODENOSCOPY (EGD) WITH PROPOFOL N/A 11/04/2021   Procedure: ESOPHAGOGASTRODUODENOSCOPY (EGD) WITH PROPOFOL;  Surgeon: Jackquline Denmark, MD;  [facility];  Service: Gastroenterology;  Laterality: N/A;   GANGLION CYST EXCISION Right    hand   HYSTERECTOMY ABDOMINAL WITH SALPINGO-OOPHORECTOMY  11/10/2014   ovarian cancer, tumor removal   INCISIONAL HERNIA REPAIR N/A 12/25/2020   Procedure: LAPAROSCOPIC INCISIONAL HERNIA REPAIR WITH MESH;  Surgeon: Kinsinger, Arta Bruce, MD;  Location: [facility];  Service: General;  Laterality: N/A;   ingrown nail removal     IR GENERIC HISTORICAL  05/12/2016   IR REMOVAL TUN ACCESS W/ PORT W/O FL MOD SED 05/12/2016 [facility]   LAPAROSCOPIC ENDOMETRIOSIS FULGURATION     LIPOMA EXCISION Left    ankle   meniscal tear Right    MENISCUS REPAIR Right    oral sugery     POLYPECTOMY  11/04/2021   Procedure: POLYPECTOMY;  Surgeon: Jackquline Denmark, MD;  Location: [facility];  Service: Gastroenterology;;   Glori Luis a cath removal     PORTA CATH INSERTION     REMOVAL OF STONES  07/26/2018   Procedure: REMOVAL OF STONES;  Surgeon: Ladene Artist, MD;  Location: [facility];  Service: Endoscopy;;   SPHINCTEROTOMY  07/26/2018   Procedure: Joan Mayans;  Surgeon: Ladene Artist, MD;  [facility];  Service: Endoscopy;;   UMBILICAL  HERNIA REPAIR  12/29/2020   VIDEO BRONCHOSCOPY WITH ENDOBRONCHIAL ULTRASOUND N/A 12/16/2020   Procedure: VIDEO BRONCHOSCOPY WITH ENDOBRONCHIAL ULTRASOUND;  Surgeon: Rigoberto Noel, MD;  Location: [place];  Service: Cardiopulmonary;  Laterality: N/A;   Social History   Social History Narrative   Lives with boyfriend in a one story home.  Has no children.     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     Education: college.   Right handed    Immunization History  Administered Date(s) Administered   Influenza,inj,Quad PF,6+ Mos 01/02/2014, 03/26/2015, 12/25/2017, 01/21/2020, 01/25/2021   Influenza-Unspecified 02/23/2016, 02/09/2017, 02/12/2020, 02/14/2022   MMR 02/09/2017, 07/03/2017   PFIZER(Purple Top)SARS-COV-2 Vaccination 08/01/2019,  08/22/2019, 02/22/2020   Tdap 11/07/2017   Tetanus 12/21/2015   Zoster Recombinat (Shingrix) 06/22/2021, 11/26/2021     Objective: Vital Signs: BP 122/75 (BP Location: Left Arm, Patient Position: Sitting, Cuff Size: Large)   Pulse 76   Resp 14   Ht 5' 7"$  (1.702 m)   Wt (!) 303 lb (137.4 kg)   LMP 11/02/2014   BMI 47.46 kg/m    Physical Exam Vitals and nursing note reviewed.  Constitutional:      Appearance: She is well-developed.  HENT:     Head: Normocephalic and atraumatic.  Eyes:     Conjunctiva/sclera: Conjunctivae normal.  Cardiovascular:     Rate and Rhythm: Normal rate and regular rhythm.     Heart sounds: Normal heart sounds.  Pulmonary:     Effort: Pulmonary effort is normal.     Breath sounds: Normal breath sounds.  Abdominal:     General: Bowel sounds are normal.     Palpations: Abdomen is soft.  Musculoskeletal:     Cervical back: Decreased range of motion.  Lymphadenopathy:     Cervical: No cervical adenopathy.  Skin:    General: Skin is warm and dry.     Capillary Refill: Capillary refill takes less than 2 seconds.  Neurological:     Mental Status: She is alert and oriented to person, place, and time.  Psychiatric:         Behavior: Behavior normal.     Musculoskeletal Exam: Cervical spine has limited ROM. Shoulder joints, elbow joints, wrist joints, MCPs PIPs and DIPs have good range of motion with no synovitis, warmth or effusion. Thickening of the DIPs and PIPs bilaterally, Tenderness to palpation of the L. CMC joint. Positive Tinel's sign bilaterally. Knee joints, ankles, MTPs and PIPs with good range of motion with no synovitis, warmth or effusion. There was limited external rotation of the R. Hip and tenderness to palpation over the R. SI joint.   CDAI Exam: CDAI Score: -- Patient Global: --; Provider Global: -- Swollen: --; Tender: -- Joint Exam 06/28/2022   No joint exam has been documented for this visit   There is currently no information documented on the homunculus. Go to the Rheumatology activity and complete the homunculus joint exam.  Investigation: No additional findings.  Imaging: No results found.  Recent Labs: Lab Results  Component Value Date   WBC 9.2 06/17/2022   HGB 13.4 06/17/2022   PLT 302 06/17/2022   NA 141 06/17/2022   K 4.1 06/17/2022   CL 103 06/17/2022   CO2 28 06/17/2022   GLUCOSE 91 06/17/2022   BUN 8 06/17/2022   CREATININE 0.77 06/17/2022   BILITOT 0.7 06/17/2022   ALKPHOS 68 11/16/2021   AST 11 06/17/2022   ALT 15 06/17/2022   PROT 7.2 06/17/2022   ALBUMIN 4.5 11/16/2021   CALCIUM 9.7 06/17/2022   GFRAA 110 11/11/2020   QFTBGOLDPLUS NEGATIVE 11/11/2020    Speciality Comments: No specialty comments available.  Procedures:  No procedures performed Allergies: Moxifloxacin, Quinolones, Sulfonamide derivatives, Doxycycline, Escitalopram oxalate, and Lisinopril   Assessment / Plan:     Visit Diagnoses: Sarcoidosis - Progression of mediastinal and hilar lymph nodes on the CT scan which improved after prednisone, subjective history of EN lesions, right parotid swelling. Followed by Dr. Elsworth Soho: Overall the patient has clinically been doing well on  methotrexate 0.8 mL subcu injections once weekly and folic acid 2 mg daily.  She has been tolerating methotrexate better with less fatigue and less  nausea. She has missed one dose of methotrexate for toenail removal but has not had any disease flare. She plans to resume the medication this Friday, 07/01/2022. She denies any shortness of breath. She had a face rash one month ago that lasted one day and was of uncertain etiology. She also complains of new blurry vision. Advised patient to follow up with ophthalmology.  Patient plans to schedule an an appointment soon.  Blurry vision - Patient complains of  blurry vision.  She denies any eye redness or discomfort.  Patient felt follow up with ophthalmology.   Low back pain-she continues to have lower back pain. Offered physical therapy referral. Patient declined. Will provide exercises in the AVS.   Positive ANA (antinuclear antibody) - Positive ANA, positive sicca symptoms and oral ulcers. AVISE lupus index -1.1, ANA 1: 160NS, antiphosphatidylserine IgM positive.  Sialolithiasis - Maxillofacial CT obtained on 03/27/2021: Subtle inflammation of the right parotid gland compatible with mild sialoadenitis.  High risk medication use - Methotrexate 0.34m sq injections once weekly and folic acid 257mby mouth daily. Hepatic function panel on 06/2022 was WNL. She has not had any recent infections but is currently holding the medication for ingrown toenail surgery. She plans to resume the medication this Friday, 07/01/2022.  Patient was advised to get labs every 3 months.  Information on immunization was placed in the AVS.  She was advised to hold methotrexate if she develops an infection and resume after the infection resolves.  Primary osteoarthritis of both knees - Chronic pain of the knees. No warmth or effusion on examination.   Osteoarthritis, hand - Recommended CMC brace for worsening pain of the L. CMC.  Prescription for left CMC brace was  given.  Bilateral carpal tunnel syndrome - Patient complains of more numbness and weakness of the hands bilaterally. She works on a coPsychiatric nurseay. Positive Tinel sign bilaterally. Advised patient to use wrist braces more consistently. Will refer patient to orthopedics for further evaluation and possible left carpal tunnel release.   S/P bunionectomy - left-Performed by Dr. WaJacqualyn Poseyn 09/08/21. Held MTX for about 6 weeks around the time of surgery.  Rash - Intermittent-both hands.  No rash was noted today.  Right lumbar radiculopathy - Resolved.  Malignant neoplasm of both ovaries ([place]- 11/09/2014 TAHBSO, CTX X 6 months.  Previously received clearance to initiate immunosuppressive therapy.   Chemotherapy-induced peripheral neuropathy ([facility] Followed by Dr. PaPosey [place] neurology.   Primary hypertension - Blood pressure well controlled at 122/75 today.   Acquired hypothyroidism  History of gastroesophageal reflux (GERD)  Migraine without aura and with status migrainosus, not intractable  Mild intermittent asthma with acute exacerbation - Prescribed Advair.  OSA on CPAP  Other fatigue  Surgical menopause  Multiple lung nodules  TB lung, latent  Orders: Orders Placed This Encounter  Procedures   Ambulatory referral to Orthopedic Surgery   No orders of the defined types were placed in this encounter.   Face-to-face time spent with patient was 30 minutes. Greater than 50% of time was spent in counseling and coordination of care.  Follow-Up Instructions: Return in about 5 months (around 11/26/2022) for Sarcoidosis, Osteoarthritis.   ShBo MerinoMD  Note - This record has been created using DrEditor, commissioning Chart creation errors have been sought, but may not always  have been located. Such creation errors do not reflect on  the standard of medical care.

## 2022-06-17 ENCOUNTER — Ambulatory Visit: Payer: Commercial Managed Care - PPO | Admitting: Internal Medicine

## 2022-06-17 VITALS — BP 128/74 | HR 70 | Temp 98.0°F | Ht 67.0 in | Wt 302.0 lb

## 2022-06-17 DIAGNOSIS — Z6841 Body Mass Index (BMI) 40.0 and over, adult: Secondary | ICD-10-CM

## 2022-06-17 DIAGNOSIS — R739 Hyperglycemia, unspecified: Secondary | ICD-10-CM

## 2022-06-17 DIAGNOSIS — E559 Vitamin D deficiency, unspecified: Secondary | ICD-10-CM

## 2022-06-17 DIAGNOSIS — E538 Deficiency of other specified B group vitamins: Secondary | ICD-10-CM

## 2022-06-17 DIAGNOSIS — I10 Essential (primary) hypertension: Secondary | ICD-10-CM

## 2022-06-17 DIAGNOSIS — R7303 Prediabetes: Secondary | ICD-10-CM

## 2022-06-17 DIAGNOSIS — R490 Dysphonia: Secondary | ICD-10-CM | POA: Diagnosis not present

## 2022-06-17 DIAGNOSIS — Z0001 Encounter for general adult medical examination with abnormal findings: Secondary | ICD-10-CM

## 2022-06-17 DIAGNOSIS — E66813 Obesity, class 3: Secondary | ICD-10-CM

## 2022-06-17 DIAGNOSIS — Z1159 Encounter for screening for other viral diseases: Secondary | ICD-10-CM

## 2022-06-17 DIAGNOSIS — Z1231 Encounter for screening mammogram for malignant neoplasm of breast: Secondary | ICD-10-CM

## 2022-06-17 NOTE — Patient Instructions (Addendum)
Your handicap form is signed today  Please continue all other medications as before, and refills have been done if requested.  Please have the pharmacy call with any other refills you may need.  Please continue your efforts at being more active, low cholesterol diet, and weight control.  You are otherwise up to date with prevention measures today.  Please keep your appointments with your specialists as you may have planned  You will be contacted regarding the referral for: ENT, and mammogram  Please call if you need the General Surgury referral for the lipoma to the left neck or left ankle areas  Please go to the LAB at the blood drawing area for the tests to be done  You will be contacted by phone if any changes need to be made immediately.  Otherwise, you will receive a letter about your results with an explanation, but please check with MyChart first.  Please remember to sign up for MyChart if you have not done so, as this will be important to you in the future with finding out test results, communicating by private email, and scheduling acute appointments online when needed.  Please make an Appointment to return in 6 months, or sooner if needed

## 2022-06-17 NOTE — Addendum Note (Signed)
Addended by: Jacobo Forest on: 06/17/2022 04:40 PM   Modules accepted: Orders

## 2022-06-17 NOTE — Progress Notes (Unsigned)
Patient ID: [name], female   DOB: 1971/02/11, 52 y.o.   MRN: [id number]         Chief Complaint:: wellness exam and wegovy side effects, hoarseness, prediabetes,, htn, low vit d,        HPI:  [name] is a 52 y.o. female here for wellness exam; for mammogram, ow up to date                        Also as ongoing hoarseness for over 2 months for unclear reason.  Pt denies chest pain, increased sob or doe, wheezing, orthopnea, PND, increased LE swelling, palpitations, dizziness or syncope.   Pt denies polydipsia, polyuria, or new focal neuro s/s.    Pt denies fever, wt loss, night sweats, loss of appetite, or other constitutional symptoms  Has some GI side effects with wegovy but willing to continue.      Wt Readings from Last 3 Encounters:  06/17/22 (!) 302 lb (137 kg)  05/17/22 299 lb (135.6 kg)  04/05/22 (!) 302 lb (137 kg)   BP Readings from Last 3 Encounters:  06/18/22 116/85  06/17/22 128/74  05/17/22 122/85   Immunization History  Administered Date(s) Administered   Influenza,inj,Quad PF,6+ Mos 01/02/2014, 03/26/2015, 12/25/2017, 01/21/2020, 01/25/2021   Influenza-Unspecified 02/23/2016, 02/09/2017, 02/12/2020, 02/14/2022   MMR 02/09/2017, 07/03/2017   PFIZER(Purple Top)SARS-COV-2 Vaccination 08/01/2019, 08/22/2019, 02/22/2020   Tdap 11/07/2017   Tetanus 12/21/2015   Zoster Recombinat (Shingrix) 06/22/2021, 11/26/2021   Health Maintenance Due  Topic Date Due   MAMMOGRAM  11/16/2016      Past Medical History:  Diagnosis Date   Anxiety    Arthritis    Asthma    Back pain    Barrett's esophagus    Chemotherapy-induced neuropathy (HCC)    Chronic lower back pain    Depression    Dyspnea    Elevated blood pressure, situational 05/05/2016   Endometriosis    Exercise-induced asthma 09/08/2015   Fatigue    Gallbladder problem    GERD (gastroesophageal reflux disease)    Heartburn    History of kidney stones    Hypertension    Hypothyroidism     Infertility, female    Joint pain    Joint stiffness    Lower extremity edema    Migraine    ovarian cancer    Ovarian cancer, bilateral ([place]) 11/28/2014   PONV (postoperative nausea and vomiting)    Sarcoidosis    Sleep apnea    SOBOE (shortness of breath on exertion)    Thyroid disease    Umbilical hernia    Vitamin D deficiency    woke up while porta cath removed    Past Surgical History:  Procedure Laterality Date   ABDOMINAL HYSTERECTOMY  11/10/2014   at [facility], Exp lap, supracervical hyst, BSO, infracolic omentectomy, lymphadenectomy, aortic lymph node sampling   BIOPSY  11/04/2021   Procedure: BIOPSY;  Surgeon: Jackquline Denmark, MD;  Location: [facility] END[facility];  Service: Gastroenterology;;   BRONCHIAL NEEDLE ASPIRATION BIOPSY  12/16/2020   Procedure: BRONCHIAL NEEDLE ASPIRATION BIOPSIES;  Surgeon: Rigoberto Noel, MD;  Location: [facility] ENDOSCOPY;  Service: Cardiopulmonary;;   BRONCHIAL WASHINGS  12/16/2020   Procedure: BRONCHIAL WASHINGS;  Surgeon: Rigoberto Noel, MD;  Location: [facility] ENDOSCOPY;  Service: Cardiopulmonary;;   BUNIONECTOMY Left 09/14/2021   CHOLECYSTECTOMY N/A 07/24/2018   Procedure: LAPAROSCOPIC CHOLECYSTECTOMY WITH INTRAOPERATIVE CHOLANGIOGRAM;  Surgeon: Armandina Gemma, MD;  Location: [facility] ORS;  Service: General;  Laterality: N/A;   COLONOSCOPY WITH PROPOFOL N/A 11/04/2021   Procedure: COLONOSCOPY WITH PROPOFOL;  Surgeon: Jackquline Denmark, MD;  Location: [facility] ENDOSCOPY;  Service: Gastroenterology;  Laterality: N/A;   ERCP N/A 07/26/2018   Procedure: ENDOSCOPIC RETROGRADE CHOLANGIOPANCREATOGRAPHY (ERCP);  Surgeon: Ladene Artist, MD;  Location: [facility];  Service: Endoscopy;  Laterality: N/A;   ESOPHAGOGASTRODUODENOSCOPY (EGD) WITH PROPOFOL N/A 11/04/2021   Procedure: ESOPHAGOGASTRODUODENOSCOPY (EGD) WITH PROPOFOL;  Surgeon: Jackquline Denmark, MD;  Location: [facility] ENDOSCOPY;  Service: Gastroenterology;  Laterality: N/A;   GANGLION CYST EXCISION Right    hand   HYSTERECTOMY ABDOMINAL  WITH SALPINGO-OOPHORECTOMY  11/10/2014   ovarian cancer, tumor removal   INCISIONAL HERNIA REPAIR N/A 12/25/2020   Procedure: LAPAROSCOPIC INCISIONAL HERNIA REPAIR WITH MESH;  Surgeon: Kinsinger, Arta Bruce, MD;  Location: [facility];  Service: General;  Laterality: N/A;   IR GENERIC HISTORICAL  05/12/2016   IR REMOVAL TUN ACCESS W/ PORT W/O FL MOD SED 05/12/2016 [facility]-INTER[facility] RAD   LAPAROSCOPIC ENDOMETRIOSIS FULGURATION     LIPOMA EXCISION Left    ankle   meniscal tear Right    MENISCUS REPAIR Right    POLYPECTOMY  11/04/2021   Procedure: POLYPECTOMY;  Surgeon: Jackquline Denmark, MD;  Location: [facility];  Service: Gastroenterology;;   Glori Luis a cath removal     PORTA CATH INSERTION     REMOVAL OF STONES  07/26/2018   Procedure: REMOVAL OF STONES;  Surgeon: Ladene Artist, MD;  Location: [facility];  Service: Endoscopy;;   SPHINCTEROTOMY  07/26/2018   Procedure: Joan Mayans;  Surgeon: Ladene Artist, MD;  Location: [facility];  Service: Endoscopy;;   UMBILICAL HERNIA REPAIR  12/29/2020   VIDEO BRONCHOSCOPY WITH ENDOBRONCHIAL ULTRASOUND N/A 12/16/2020   Procedure: VIDEO BRONCHOSCOPY WITH ENDOBRONCHIAL ULTRASOUND;  Surgeon: Rigoberto Noel, MD;  Location: [facility];  Service: Cardiopulmonary;  Laterality: N/A;    reports that she has never smoked. She has been exposed to tobacco smoke. She has never used smokeless tobacco. She reports that she does not currently use alcohol. She reports that she does not use drugs. family history includes Atrial fibrillation in her father; Dementia in her paternal grandfather and paternal grandmother; Diabetes in her maternal aunt and mother; Healthy in her brother; Heart attack in her maternal grandfather; Heart disease in her mother; Heart failure in her maternal grandmother; Hyperlipidemia in her mother; Hypertension in her mother; Osteoarthritis in her mother; Stroke in her father and mother. Allergies  Allergen Reactions   Moxifloxacin Anaphylaxis     needs epinephrine shot   Quinolones Anaphylaxis, Shortness Of Breath and Swelling   Sulfonamide Derivatives Hives, Swelling and Rash    needs epinephrine shot--rash and lip swelling   Doxycycline Nausea And Vomiting   Escitalopram Oxalate Other (See Comments)     fatigue   Lisinopril Cough   Current Outpatient Medications on File Prior to Visit  Medication Sig Dispense Refill   ALPRAZolam (XANAX) 1 MG tablet Take 1 tablet (1 mg total) by mouth 2 (two) times daily as needed for anxiety. 60 tablet 2   amphetamine-dextroamphetamine (ADDERALL XR) 20 MG 24 hr capsule Take 1 capsule (20 mg total) by mouth daily. 30 capsule 0   benzonatate (TESSALON) 100 MG capsule Take by mouth 3 (three) times daily as needed for cough.     Blood Pressure Monitoring (OMRON 3 SERIES BP MONITOR) DEVI Use as directed. 1 each 0   buPROPion (WELLBUTRIN XL) 150 MG 24 hr tablet Take 1 tablet (150 mg total) by  mouth daily. 90 tablet 3   clobetasol cream (TEMOVATE) [id number] % Apply 1 Application topically 2 (two) times daily as needed. 45 g 0   cyclobenzaprine (FLEXERIL) 5 MG tablet Take 1 tablet (5 mg total) by mouth at bedtime as needed for muscle spasms. 90 tablet 3   estradiol (ESTRACE) 1 MG tablet Take 1 tablet (1 mg total) by mouth daily. 90 tablet 3   fluticasone-salmeterol (ADVAIR DISKUS) 250-50 MCG/ACT AEPB Inhale 1 puff into the lungs in the morning and at bedtime. [id number] each 3   folic acid (FOLVITE) 1 MG tablet Take 2 tablets (2 mg total) by mouth daily. 180 tablet 3   gabapentin (NEURONTIN) 600 MG tablet Take 1 tablet (600 mg total) by mouth 2 (two) times daily. OK to take extra dose as needed 210 tablet 3   levothyroxine (SYNTHROID) 100 MCG tablet Take 1 tablet (100 mcg total) by mouth daily. 90 tablet 3   losartan (COZAAR) 25 MG tablet Take 1 tablet (25 mg total) by mouth daily. 90 tablet 3   methotrexate 50 MG/2ML injection Inject 0.8 mLs (20 mg total) into the skin once a week. 10 mL 0   omeprazole (PRILOSEC) 40  MG capsule Take 1 capsule (40 mg total) by mouth daily. 30 capsule 3   polyvinyl alcohol (ARTIFICIAL TEARS) 1.4 % ophthalmic solution Place 1 drop into both eyes as needed for dry eyes. 15 mL 0   Semaglutide-Weight Management (WEGOVY) 1.7 MG/0.75ML SOAJ Inject 1.7 mg into the skin once a week. 3 mL 0   sertraline (ZOLOFT) 100 MG tablet TAKE 2 TABLETS (200 MG TOTAL) BY MOUTH DAILY. (Patient taking differently: Take 100 mg by mouth in the morning and at bedtime.) 180 tablet 3   traZODone (DESYREL) 50 MG tablet Take 0.5-1 tablets (25-50 mg total) by mouth at bedtime as needed for sleep. 90 tablet 0   Vitamin D, Ergocalciferol, (DRISDOL) 1.25 MG (50000 UNIT) CAPS capsule Take 1 capsule (50,000 Units total) by mouth every 7 (seven) days. 5 capsule 0   Zoster Vaccine Adjuvanted [facility]) injection Inject into the muscle. 0.5 mL 0   No current facility-administered medications on file prior to visit.        ROS:  All others reviewed and negative.  Objective        PE:  BP 128/74 (BP Location: Right Arm, Patient Position: Sitting, Cuff Size: Large)   Pulse 70   Temp 98 F (36.7 C) (Oral)   Ht 5' 7"$  (1.702 m)   Wt (!) 302 lb (137 kg)   LMP 11/02/2014   SpO2 98%   BMI 47.30 kg/m                 Constitutional: Pt appears in NAD               HENT: Head: NCAT.                Right Ear: External ear normal.                 Left Ear: External ear normal.                Eyes: . Pupils are equal, round, and reactive to light. Conjunctivae and EOM are normal               Nose: without d/c or deformity               Neck: Neck supple. Gross normal ROM  Cardiovascular: Normal rate and regular rhythm.                 Pulmonary/Chest: Effort normal and breath sounds without rales or wheezing.                Abd:  Soft, NT, ND, + BS, no organomegaly               Neurological: Pt is alert. At baseline orientation, motor grossly intact               Skin: Skin is warm. No rashes, no  other new lesions, LE edema - none               Psychiatric: Pt behavior is normal without agitation   Micro: none  Cardiac tracings I have personally interpreted today:  none  Pertinent Radiological findings (summarize): none   Lab Results  Component Value Date   WBC 10.0 05/10/2022   HGB 13.8 05/10/2022   HCT 40.4 05/10/2022   PLT 289 05/10/2022   GLUCOSE 86 05/10/2022   CHOL 159 06/25/2021   TRIG 140.0 06/25/2021   HDL 38.90 (L) 06/25/2021   LDLCALC 92 06/25/2021   ALT 16 05/10/2022   AST 15 05/10/2022   NA 141 05/10/2022   K 4.0 05/10/2022   CL 103 05/10/2022   CREATININE 0.72 05/10/2022   BUN 8 05/10/2022   CO2 29 05/10/2022   TSH 5.360 (H) 11/16/2021   INR 0.90 05/12/2016   HGBA1C 5.8 (H) 11/16/2021   Assessment/Plan:  Lakiska Feimster is a 51 y.o. White or Caucasian [1] female with  has a past medical history of Anxiety, Arthritis, Asthma, Back pain, Barrett's esophagus, Chemotherapy-induced neuropathy ([place]), Chronic lower back pain, Depression, Dyspnea, Elevated blood pressure, situational (05/05/2016), Endometriosis, Exercise-induced asthma (09/08/2015), Fatigue, Gallbladder problem, GERD (gastroesophageal reflux disease), Heartburn, History of kidney stones, Hypertension, Hypothyroidism, Infertility, female, Joint pain, Joint stiffness, Lower extremity edema, Migraine, ovarian cancer, Ovarian cancer, bilateral ([place]) (11/28/2014), PONV (postoperative nausea and vomiting), Sarcoidosis, Sleep apnea, SOBOE (shortness of breath on exertion), Thyroid disease, Umbilical hernia, Vitamin D deficiency, and woke up while porta cath removed.  Encounter for well adult exam with abnormal findings Age and sex appropriate education and counseling updated with regular exercise and diet Referrals for preventative services - for mammogram, hep c screen Immunizations addressed - none needed Smoking counseling  - none needed Evidence for depression or other mood disorder - none  significant Most recent labs reviewed. I have personally reviewed and have noted: 1) the patient's medical and social history 2) The patient's current medications and supplements 3) The patient's height, weight, and BMI have been recorded in the chart   Vitamin D deficiency Last vitamin D Lab Results  Component Value Date   VD25OH 21.3 (L) 11/16/2021   Low, to start oral replacement   Prediabetes Lab Results  Component Value Date   HGBA1C 5.8 (H) 11/16/2021   Stable, pt to continue current medical treatment  - diet, wt control   HTN (hypertension) BP Readings from Last 3 Encounters:  06/18/22 116/85  06/17/22 128/74  05/17/22 122/85   Stable, pt to continue medical treatment losartan 25 qd   Class 3 severe obesity with serious comorbidity and body mass index (BMI) of 45.0 to 49.9 in adult [facility]) Ok for wegovy continue  Followup: Return in about 6 months (around 12/16/2022).  [name], MD 06/18/2022 9:59 PM [facility]

## 2022-06-18 ENCOUNTER — Encounter (HOSPITAL_BASED_OUTPATIENT_CLINIC_OR_DEPARTMENT_OTHER): Payer: Self-pay | Admitting: Emergency Medicine

## 2022-06-18 ENCOUNTER — Emergency Department (HOSPITAL_BASED_OUTPATIENT_CLINIC_OR_DEPARTMENT_OTHER)
Admission: EM | Admit: 2022-06-18 | Discharge: 2022-06-18 | Disposition: A | Discharge status: Home / Self Care | Payer: Commercial Managed Care - PPO | Attending: Emergency Medicine | Admitting: Emergency Medicine

## 2022-06-18 ENCOUNTER — Other Ambulatory Visit: Payer: Self-pay

## 2022-06-18 ENCOUNTER — Encounter: Payer: Self-pay | Admitting: Internal Medicine

## 2022-06-18 ENCOUNTER — Other Ambulatory Visit (HOSPITAL_BASED_OUTPATIENT_CLINIC_OR_DEPARTMENT_OTHER): Payer: Self-pay

## 2022-06-18 DIAGNOSIS — L600 Ingrowing nail: Secondary | ICD-10-CM

## 2022-06-18 DIAGNOSIS — L03031 Cellulitis of right toe: Secondary | ICD-10-CM

## 2022-06-18 DIAGNOSIS — L03039 Cellulitis of unspecified toe: Secondary | ICD-10-CM | POA: Diagnosis not present

## 2022-06-18 DIAGNOSIS — Z8543 Personal history of malignant neoplasm of ovary: Secondary | ICD-10-CM | POA: Diagnosis not present

## 2022-06-18 DIAGNOSIS — Z79899 Other long term (current) drug therapy: Secondary | ICD-10-CM | POA: Insufficient documentation

## 2022-06-18 DIAGNOSIS — R490 Dysphonia: Secondary | ICD-10-CM | POA: Insufficient documentation

## 2022-06-18 DIAGNOSIS — I10 Essential (primary) hypertension: Secondary | ICD-10-CM | POA: Insufficient documentation

## 2022-06-18 MED ORDER — CEPHALEXIN 500 MG PO CAPS
500.0000 mg | ORAL_CAPSULE | Freq: Two times a day (BID) | ORAL | 0 refills | Status: AC
  Filled 2022-06-18: qty 14, 7d supply, fill #0

## 2022-06-18 MED ORDER — LIDOCAINE HCL (PF) 1 % IJ SOLN
5.0000 mL | Freq: Once | INTRAMUSCULAR | Status: AC
  Administered 2022-06-18: 5 mL
  Filled 2022-06-18: qty 5

## 2022-06-18 MED ORDER — OXYCODONE-ACETAMINOPHEN 5-325 MG PO TABS
1.0000 | ORAL_TABLET | Freq: Four times a day (QID) | ORAL | 0 refills | Status: DC | PRN
  Filled 2022-06-18: qty 12, 3d supply, fill #0

## 2022-06-18 MED ORDER — LORAZEPAM 1 MG PO TABS
1.0000 mg | ORAL_TABLET | Freq: Once | ORAL | Status: AC
  Administered 2022-06-18: 1 mg via ORAL
  Filled 2022-06-18: qty 1

## 2022-06-18 NOTE — Discharge Instructions (Addendum)
Today we removed part of your toenail.  Please keep the area clean and dry.  Keep some sort of bandage over this.  Follow-up with your primary care doctor within about a week.  Take the antibiotic prescribed, Keflex, twice daily for 7 days.  Return to the ED or your PCP if the infection seems to get worse despite the antibiotic.  We do not have the solution in stock in the ED to stop the nail from growing back.  If you would like that done, go to your primary care doctor or podiatry to have this done for definitive cure.

## 2022-06-18 NOTE — ED Notes (Signed)
Reviewed D/C instructions and medications with patients. No further question upon discharge.

## 2022-06-18 NOTE — ED Provider Notes (Signed)
[facility] Provider Note  CSN: [id number] Arrival date & time: 06/18/22  1118  History  Chief Complaint  Patient presents with   Toe Pain   [name] is a 52 y.o. female.  52 yo woman presents with worsening pain, tenderness, and swelling of lateral right great toe. She reports going to have a pedicure a week or so ago, found to have an ingrown toe nail. After the pedicure, she noted worsening symptoms of lateral toe. Some discharge, looks like yellow pus. No streaks around the wound, fever, chills, or hot swollen joints proximally.   PMH ovarian cancer s/p successful treatment, HTN, OSA on CPAP.     Home Medications Prior to Admission medications   Medication Sig Start Date End Date Taking? Authorizing Provider  cephALEXin (KEFLEX) 500 MG capsule Take 1 capsule (500 mg total) by mouth 2 (two) times daily for 7 days. 06/18/22 06/25/22 Yes Ezequiel Essex, MD  ALPRAZolam Duanne Moron) 1 MG tablet Take 1 tablet (1 mg total) by mouth 2 (two) times daily as needed for anxiety. 02/14/22   Biagio Borg, MD  amphetamine-dextroamphetamine (ADDERALL XR) 20 MG 24 hr capsule Take 1 capsule (20 mg total) by mouth daily. 05/27/22   Biagio Borg, MD  benzonatate (TESSALON) 100 MG capsule Take by mouth 3 (three) times daily as needed for cough.    [provider]  Blood Pressure Monitoring (OMRON 3 SERIES BP MONITOR) DEVI Use as directed. 12/09/20     buPROPion (WELLBUTRIN XL) 150 MG 24 hr tablet Take 1 tablet (150 mg total) by mouth daily. 12/17/21   Biagio Borg, MD  clobetasol cream (TEMOVATE) [id number] % Apply 1 Application topically 2 (two) times daily as needed. 01/26/22   Ofilia Neas, PA-C  cyclobenzaprine (FLEXERIL) 5 MG tablet Take 1 tablet (5 mg total) by mouth at bedtime as needed for muscle spasms. 01/26/22   Patel, Arvin Collard K, DO  estradiol (ESTRACE) 1 MG tablet Take 1 tablet (1 mg total) by mouth daily. 12/17/21   Biagio Borg, MD   fluticasone-salmeterol (ADVAIR DISKUS) 250-50 MCG/ACT AEPB Inhale 1 puff into the lungs in the morning and at bedtime. 06/25/21   Biagio Borg, MD  folic acid (FOLVITE) 1 MG tablet Take 2 tablets (2 mg total) by mouth daily. 01/26/22   Ofilia Neas, PA-C  gabapentin (NEURONTIN) 600 MG tablet Take 1 tablet (600 mg total) by mouth 2 (two) times daily. OK to take extra dose as needed 01/26/22 01/26/23  Narda Amber K, DO  levothyroxine (SYNTHROID) 100 MCG tablet Take 1 tablet (100 mcg total) by mouth daily. 12/17/21   Biagio Borg, MD  losartan (COZAAR) 25 MG tablet Take 1 tablet (25 mg total) by mouth daily. 01/18/22   Biagio Borg, MD  methotrexate 50 MG/2ML injection Inject 0.8 mLs (20 mg total) into the skin once a week. 05/11/22   Ofilia Neas, PA-C  omeprazole (PRILOSEC) 40 MG capsule Take 1 capsule (40 mg total) by mouth daily. 05/11/22   Jackquline Denmark, MD  oxyCODONE-acetaminophen (PERCOCET) 5-325 MG tablet Take 1 tablet by mouth every 6 (six) hours as needed for severe pain. 06/18/22 06/18/23 Yes Ezequiel Essex, MD  polyvinyl alcohol (ARTIFICIAL TEARS) 1.4 % ophthalmic solution Place 1 drop into both eyes as needed for dry eyes. 10/29/21   Biagio Borg, MD  Semaglutide-Weight Management (WEGOVY) 1.7 MG/0.75ML SOAJ Inject 1.7 mg into the skin once a week. 05/17/22   Bowen,  Collene Leyden, DO  sertraline (ZOLOFT) 100 MG tablet TAKE 2 TABLETS (200 MG TOTAL) BY MOUTH DAILY. Patient taking differently: Take 100 mg by mouth in the morning and at bedtime. 08/11/21   Biagio Borg, MD  traZODone (DESYREL) 50 MG tablet Take 0.5-1 tablets (25-50 mg total) by mouth at bedtime as needed for sleep. 04/19/22   Biagio Borg, MD  Vitamin D, Ergocalciferol, (DRISDOL) 1.25 MG (50000 UNIT) CAPS capsule Take 1 capsule (50,000 Units total) by mouth every 7 (seven) days. 04/05/22   Bowen, Collene Leyden, DO  Zoster Vaccine Adjuvanted [facility]) injection Inject into the muscle. 11/26/21   Carlyle Basques, MD     Allergies     Moxifloxacin, Quinolones, Sulfonamide derivatives, Doxycycline, Escitalopram oxalate, and Lisinopril    Review of Systems   Review of Systems  Constitutional:  Negative for chills, fatigue and fever.  Respiratory:  Negative for cough, shortness of breath and wheezing.   Cardiovascular:  Negative for chest pain.  Gastrointestinal:  Negative for abdominal pain.  Skin:  Positive for wound.    Physical Exam Updated Vital Signs BP 116/85 (BP Location: Right Arm)   Pulse 83   Temp 98.4 F (36.9 C) (Oral)   Resp 17   LMP 11/02/2014   SpO2 98%  Physical Exam Vitals and nursing note reviewed.  Constitutional:      General: She is not in acute distress.    Appearance: Normal appearance. She is not ill-appearing, toxic-appearing or diaphoretic.  HENT:     Head: Normocephalic.  Cardiovascular:     Pulses:          Dorsalis pedis pulses are 3+ on the right side and 3+ on the left side.  Musculoskeletal:       Feet:  Feet:     Right foot:     Skin integrity: Skin integrity normal.     Toenail Condition: Right toenails are ingrown.     Left foot:     Skin integrity: Skin integrity normal.     Toenail Condition: Left toenails are normal.  Skin:    General: Skin is warm.     Capillary Refill: Capillary refill takes less than 2 seconds.  Neurological:     Mental Status: She is alert.  Psychiatric:        Mood and Affect: Mood normal.        Behavior: Behavior normal.     ED Results / Procedures / Treatments   Labs (all labs ordered are listed, but only abnormal results are displayed) Labs Reviewed - No data to display  EKG None  Radiology No results found.  Procedures Excise ingrown toenail  Date/Time: 06/18/2022 4:08 PM  Performed by: Ezequiel Essex, MD Authorized by: Courtney Paris, MD  Consent: Verbal consent obtained. Consent given by: patient Patient understanding: patient states understanding of the procedure being performed Patient consent: the  patient's understanding of the procedure matches consent given Patient identity confirmed: verbally with patient Preparation: Patient was prepped and draped in the usual sterile fashion. Local anesthesia used: yes Anesthesia: local infiltration and digital block  Anesthesia: Local anesthesia used: yes Local Anesthetic: lidocaine 1% without epinephrine Anesthetic total: 5 mL  Sedation: Patient sedated: no  Patient tolerance: patient tolerated the procedure well with no immediate complications Comments: 3-way toe block with wing block, total 5 mL lidocaine WITHOUT epi      Medications Ordered in ED Medications  lidocaine (PF) (XYLOCAINE) 1 % injection 5 mL (5 mLs Infiltration Given  06/18/22 1313)  LORazepam (ATIVAN) tablet 1 mg (1 mg Oral Given 06/18/22 1348)   ED Course/ Medical Decision Making/ A&P  Medical Decision Making Pleasant 52 yo female with painful tender ingrown toenail of medial right great toe. Appears to have paronychia. After shared decision making discussion, patient elected for partial removal of ingrown toenail. Procedure documented above. Patient tolerated well without adverse effects. Technically difficulty removal given splintering of deeply embedded affected nail. Eventually ingrown nail was removed in its entirety. After care instructions and follow up recommendations given. Rx keflex and limited # percoset. See AVS for more.   Amount and/or Complexity of Data Reviewed Independent Historian: spouse    Details: Husband at bedside.   Risk Prescription drug management.   Final Clinical Impression(s) / ED Diagnoses Final diagnoses:  Ingrown nail  Paronychia of great toe of right foot   Rx / DC Orders ED Discharge Orders          Ordered    cephALEXin (KEFLEX) 500 MG capsule  2 times daily        06/18/22 1326    oxyCODONE-acetaminophen (PERCOCET) 5-325 MG tablet  Every 6 hours PRN        06/18/22 1548           Ezequiel Essex, MD   Ezequiel Essex, MD 06/18/22 1612    Tegeler, Gwenyth Allegra, MD 06/21/22 1240

## 2022-06-18 NOTE — Assessment & Plan Note (Signed)
BP Readings from Last 3 Encounters:  06/18/22 116/85  06/17/22 128/74  05/17/22 122/85   Stable, pt to continue medical treatment losartan 25 qd

## 2022-06-18 NOTE — Assessment & Plan Note (Signed)
Age and sex appropriate education and counseling updated with regular exercise and diet Referrals for preventative services - for mammogram, hep c screen Immunizations addressed - none needed Smoking counseling  - none needed Evidence for depression or other mood disorder - none significant Most recent labs reviewed. I have personally reviewed and have noted: 1) the patient's medical and social history 2) The patient's current medications and supplements 3) The patient's height, weight, and BMI have been recorded in the chart

## 2022-06-18 NOTE — Assessment & Plan Note (Signed)
[place] for [employer] continue

## 2022-06-18 NOTE — Assessment & Plan Note (Signed)
Last vitamin D Lab Results  Component Value Date   VD25OH 21.3 (L) 11/16/2021   Low, to start oral replacement

## 2022-06-18 NOTE — ED Triage Notes (Signed)
Right foot ingrown toenail ,big toe. Pediure months ago. Red/inflamed/draining.

## 2022-06-18 NOTE — Assessment & Plan Note (Signed)
Lab Results  Component Value Date   HGBA1C 5.8 (H) 11/16/2021   Stable, pt to continue current medical treatment  - diet, wt control

## 2022-06-18 NOTE — ED Notes (Signed)
Patient report anxiety about procedure. Normally takes xanax at home but missed her dose today. See MAR.

## 2022-06-19 ENCOUNTER — Other Ambulatory Visit (HOSPITAL_BASED_OUTPATIENT_CLINIC_OR_DEPARTMENT_OTHER): Payer: Self-pay

## 2022-06-19 ENCOUNTER — Other Ambulatory Visit: Payer: Self-pay

## 2022-06-19 LAB — BASIC METABOLIC PANEL
BUN: 8 mg/dL (ref 7–25)
CO2: 28 mmol/L (ref 20–32)
Calcium: 9.7 mg/dL (ref 8.6–10.4)
Chloride: 103 mmol/L (ref 98–110)
Creat: 0.77 mg/dL (ref 0.50–1.03)
Glucose, Bld: 91 mg/dL (ref 65–99)
Potassium: 4.1 mmol/L (ref 3.5–5.3)
Sodium: 141 mmol/L (ref 135–146)

## 2022-06-19 LAB — HEMOGLOBIN A1C
Hgb A1c MFr Bld: 5.3 % of total Hgb (ref <5.7)
Mean Plasma Glucose: 105 mg/dL
eAG (mmol/L): 5.8 mmol/L

## 2022-06-19 LAB — CBC WITH DIFFERENTIAL/PLATELET
Absolute Monocytes: 515 cells/uL (ref 200–950)
Basophils Absolute: 18 cells/uL (ref 0–200)
Basophils Relative: 0.2 %
Eosinophils Absolute: 285 cells/uL (ref 15–500)
Eosinophils Relative: 3.1 %
HCT: 38.5 % (ref 35.0–45.0)
Hemoglobin: 13.4 g/dL (ref 11.7–15.5)
Lymphs Abs: 2355 cells/uL (ref 850–3900)
MCH: 31.3 pg (ref 27.0–33.0)
MCHC: 34.8 g/dL (ref 32.0–36.0)
MCV: 90 fL (ref 80.0–100.0)
MPV: 9.9 fL (ref 7.5–12.5)
Monocytes Relative: 5.6 %
Neutro Abs: 6026 cells/uL (ref 1500–7800)
Neutrophils Relative %: 65.5 %
Platelets: 302 10*3/uL (ref 140–400)
RBC: 4.28 10*6/uL (ref 3.80–5.10)
RDW: 12.6 % (ref 11.0–15.0)
Total Lymphocyte: 25.6 %
WBC: 9.2 10*3/uL (ref 3.8–10.8)

## 2022-06-19 LAB — EXTRA URINE SPECIMEN

## 2022-06-19 LAB — URINALYSIS, ROUTINE W REFLEX MICROSCOPIC
Bilirubin Urine: NEGATIVE
Glucose, UA: NEGATIVE
Hgb urine dipstick: NEGATIVE
Ketones, ur: NEGATIVE
Leukocytes,Ua: NEGATIVE
Nitrite: NEGATIVE
Protein, ur: NEGATIVE
Specific Gravity, Urine: 1.007 (ref 1.001–1.035)
pH: 6.5 (ref 5.0–8.0)

## 2022-06-19 LAB — HEPATIC FUNCTION PANEL
AG Ratio: 1.7 (calc) (ref 1.0–2.5)
ALT: 15 U/L (ref 6–29)
AST: 11 U/L (ref 10–35)
Albumin: 4.5 g/dL (ref 3.6–5.1)
Alkaline phosphatase (APISO): 63 U/L (ref 37–153)
Bilirubin, Direct: 0.2 mg/dL (ref 0.0–0.2)
Globulin: 2.7 g/dL (calc) (ref 1.9–3.7)
Indirect Bilirubin: 0.5 mg/dL (calc) (ref 0.2–1.2)
Total Bilirubin: 0.7 mg/dL (ref 0.2–1.2)
Total Protein: 7.2 g/dL (ref 6.1–8.1)

## 2022-06-19 LAB — TSH: TSH: 3.36 mIU/L

## 2022-06-19 LAB — LIPID PANEL
Cholesterol: 152 mg/dL (ref <200)
HDL: 42 mg/dL — ABNORMAL LOW (ref >=50)
LDL Cholesterol (Calc): 90 mg/dL (calc)
Non-HDL Cholesterol (Calc): 110 mg/dL (calc) (ref <130)
Total CHOL/HDL Ratio: 3.6 (calc) (ref <5.0)
Triglycerides: 108 mg/dL (ref <150)

## 2022-06-19 LAB — HEPATITIS C ANTIBODY: Hepatitis C Ab: NONREACTIVE

## 2022-06-19 LAB — VITAMIN D 25 HYDROXY (VIT D DEFICIENCY, FRACTURES): Vit D, 25-Hydroxy: 50 ng/mL (ref 30–100)

## 2022-06-19 LAB — VITAMIN B12: Vitamin B-12: 424 pg/mL (ref 200–1100)

## 2022-06-20 ENCOUNTER — Other Ambulatory Visit (HOSPITAL_BASED_OUTPATIENT_CLINIC_OR_DEPARTMENT_OTHER): Payer: Self-pay

## 2022-06-20 ENCOUNTER — Encounter (INDEPENDENT_AMBULATORY_CARE_PROVIDER_SITE_OTHER): Payer: Self-pay | Admitting: Family Medicine

## 2022-06-20 ENCOUNTER — Ambulatory Visit (INDEPENDENT_AMBULATORY_CARE_PROVIDER_SITE_OTHER): Payer: Commercial Managed Care - PPO | Admitting: Family Medicine

## 2022-06-20 ENCOUNTER — Telehealth: Payer: Self-pay

## 2022-06-20 VITALS — BP 120/79 | HR 76 | Temp 98.3°F | Ht 67.0 in | Wt 297.0 lb

## 2022-06-20 DIAGNOSIS — R7303 Prediabetes: Secondary | ICD-10-CM

## 2022-06-20 DIAGNOSIS — Z6841 Body Mass Index (BMI) 40.0 and over, adult: Secondary | ICD-10-CM

## 2022-06-20 DIAGNOSIS — R632 Polyphagia: Secondary | ICD-10-CM

## 2022-06-20 DIAGNOSIS — E669 Obesity, unspecified: Secondary | ICD-10-CM | POA: Diagnosis not present

## 2022-06-20 DIAGNOSIS — E559 Vitamin D deficiency, unspecified: Secondary | ICD-10-CM | POA: Diagnosis not present

## 2022-06-20 MED ORDER — WEGOVY 1.7 MG/0.75ML ~~LOC~~ SOAJ
1.7000 mg | SUBCUTANEOUS | 0 refills | Status: DC
  Filled 2022-06-20: qty 3, 28d supply, fill #0

## 2022-06-20 MED ORDER — VITAMIN D (ERGOCALCIFEROL) 1.25 MG (50000 UNIT) PO CAPS
50000.0000 [IU] | ORAL_CAPSULE | ORAL | 0 refills | Status: DC
  Filled 2022-06-20: qty 5, 35d supply, fill #0

## 2022-06-20 NOTE — Assessment & Plan Note (Signed)
Due for recheck vitamin D level today Taking vitamin D 50,000 IU once weekly Last vitamin D level 21.3 November 16, 2021 Target vitamin D level 50-70

## 2022-06-20 NOTE — Assessment & Plan Note (Signed)
Last A1c 5.8 November 16, 2021 Has been doing well with weight reduction, reducing intake of added sugar and adding in more regular walking Due for recheck A1c today Currently not on metformin

## 2022-06-20 NOTE — Assessment & Plan Note (Addendum)
Changed Saxenda to [facility] 1.7 mg once weekly injection last visit Minimal GI side effects Improved satiety vs Saxenda Reviewed limiting meals out, high sugar foods, greasy foods to ease GI symptoms. Can use Pepcid AC for dyspepsia side effects.

## 2022-06-20 NOTE — Assessment & Plan Note (Signed)
Improving Net weight loss 36 pounds in the past 7 months of medically supervised weight management Reviewed bioimpedance results Has plans to resume water exercise and walking once her foot is healed

## 2022-06-20 NOTE — Transitions of Care (Post Inpatient/ED Visit) (Unsigned)
   06/20/2022  Name: [name] MRN: [id number] DOB: [birth year]  Today's TOC FU Call Status: Today's TOC FU Call Status:: Unsuccessul Call (1st Attempt) Unsuccessful Call (1st Attempt) Date: 06/20/22  Attempted to reach the patient regarding the most recent Inpatient/ED visit.  Follow Up Plan: Additional outreach attempts will be made to reach the patient to complete the Transitions of Care (Post Inpatient/ED visit) call.   Signature Juanda Crumble, [place] Direct Dial [phone]

## 2022-06-20 NOTE — Progress Notes (Signed)
Office: [phone]  /  Fax: [phone]  WEIGHT SUMMARY AND BIOMETRICS  Medical Weight Loss Height: 5' 7"$  (1.702 m) Weight: 297 lb (134.7 kg) Temp: 98.3 F (36.8 C) Pulse Rate: 76 BP: 120/79 SpO2: 96 % Fasting: no Labs: no Today's Visit #: 9 Weight at Last VIsit: 299lb Weight Lost Since Last Visit: 2lb  Body Fat %: 54.8 % Fat Mass (lbs): 162.8 lbs Muscle Mass (lbs): 127.4 lbs Visceral Fat Rating : 19 Starting Date: 11/16/21 Starting Weight: 333lb Total Weight Loss (lbs): 36 lb (16.3 kg)    HPI  Chief Complaint: OBESITY  [name] is here to discuss her progress with her obesity treatment plan. She practicing portion control and making smarter food choices, such as increasing vegetables and decreasing simple carbohydrates and states she is following her eating plan approximately 50 % of the time. She states she is walking for 15 minutes 1 times per week.   Interval History:  Since last office visit she lost 2 lbs This gives her a net weight loss of 36 pounds in the past 7 months She has been struggling with job stress and her dad came to visit in January and there was more eating out.  Her food plan was changed to portion control smarter choices last visit as she grew tired of the same food options Working long hours.    Pharmacotherapy: Wegovy 1.7 mg [place] weekly This was changed from [place] 3 mg daily injection Having an increase in sour burps and some post prandial nausea and GI upset    PHYSICAL EXAM:  Blood pressure 120/79, pulse 76, temperature 98.3 F (36.8 C), height 5' 7"$  (1.702 m), weight 297 lb (134.7 kg), last menstrual period 11/02/2014, SpO2 96 %. Body mass index is 46.52 kg/m.  General: She is overweight, cooperative, alert, well developed, and in no acute distress. PSYCH: Has normal mood, affect and thought process.   HEENT: EOMI, sclerae are anicteric. Lungs: Normal breathing effort, no conversational dyspnea. Extremities: No edema.   Neurologic: No gross sensory or motor deficits. No tremors or fasciculations noted.    DIAGNOSTIC DATA REVIEWED:  BMET    Component Value Date/Time   NA 141 06/17/2022 1640   NA 141 11/16/2021 1026   NA 139 04/21/2016 1439   K 4.1 06/17/2022 1640   K 3.4 (L) 04/21/2016 1439   CL 103 06/17/2022 1640   CO2 28 06/17/2022 1640   CO2 25 04/21/2016 1439   GLUCOSE 91 06/17/2022 1640   GLUCOSE 115 04/21/2016 1439   BUN 8 06/17/2022 1640   BUN 9 11/16/2021 1026   BUN 10.6 04/21/2016 1439   CREATININE 0.77 06/17/2022 1640   CREATININE 0.8 04/21/2016 1439   CALCIUM 9.7 06/17/2022 1640   CALCIUM 9.6 04/21/2016 1439   GFRNONAA >60 03/27/2021 1005   GFRNONAA 95 11/11/2020 0000   GFRAA 110 11/11/2020 0000   Lab Results  Component Value Date   HGBA1C 5.3 06/17/2022   HGBA1C 5.7 10/16/2017   Lab Results  Component Value Date   INSULIN 17.5 11/16/2021   Lab Results  Component Value Date   TSH 3.36 06/17/2022   CBC    Component Value Date/Time   WBC 9.2 06/17/2022 1640   RBC 4.28 06/17/2022 1640   HGB 13.4 06/17/2022 1640   HGB 13.5 11/16/2021 1026   HGB 13.7 04/21/2016 1439   HCT 38.5 06/17/2022 1640   HCT 40.6 11/16/2021 1026   HCT 40.1 04/21/2016 1439   PLT 302 06/17/2022 1640  PLT 280 11/16/2021 1026   MCV 90.0 06/17/2022 1640   MCV 90 11/16/2021 1026   MCV 87.7 04/21/2016 1439   MCH 31.3 06/17/2022 1640   MCHC 34.8 06/17/2022 1640   RDW 12.6 06/17/2022 1640   RDW 12.8 11/16/2021 1026   RDW 13.1 04/21/2016 1439   Iron Studies No results found for: "IRON", "TIBC", "FERRITIN", "IRONPCTSAT" Lipid Panel     Component Value Date/Time   CHOL 152 06/17/2022 1640   TRIG 108 06/17/2022 1640   HDL 42 (L) 06/17/2022 1640   CHOLHDL 3.6 06/17/2022 1640   VLDL 28.0 06/25/2021 1152   LDLCALC 90 06/17/2022 1640   Hepatic Function Panel     Component Value Date/Time   PROT 7.2 06/17/2022 1640   PROT 7.4 11/16/2021 1026   PROT 7.6 04/21/2016 1439   ALBUMIN 4.5  11/16/2021 1026   ALBUMIN 3.7 04/21/2016 1439   AST 11 06/17/2022 1640   AST 19 04/21/2016 1439   ALT 15 06/17/2022 1640   ALT 25 04/21/2016 1439   ALKPHOS 68 11/16/2021 1026   ALKPHOS 88 04/21/2016 1439   BILITOT 0.7 06/17/2022 1640   BILITOT 1.2 11/16/2021 1026   BILITOT 0.73 04/21/2016 1439   BILIDIR 0.2 06/17/2022 1640   IBILI 0.5 06/17/2022 1640      Component Value Date/Time   TSH 3.36 06/17/2022 1640   Nutritional Lab Results  Component Value Date   VD25OH 50 06/17/2022   VD25OH 21.3 (L) 11/16/2021   VD25OH 13.51 (L) 06/25/2021     ASSESSMENT AND PLAN  TREATMENT PLAN FOR OBESITY:  Recommended Dietary Goals  Elowyn is currently in the action stage of change. As such, her goal is to continue weight management plan. She has agreed to practicing portion control and making smarter food choices, such as increasing vegetables and decreasing simple carbohydrates.  Behavioral Intervention  We discussed the following Behavioral Modification Strategies today: increasing lean protein intake, increasing vegetables, increasing fiber rich foods, increase water intake, decrease eating out, work on meal planning and easy cooking plans, and think about ways to increase physical activity.  Additional resources provided today: NA  Recommended Physical Activity Goals  Mignonette has been advised to work up to 150 minutes of moderate intensity aerobic activity a week and strengthening exercises 2-3 times per week for cardiovascular health, weight loss maintenance and preservation of muscle mass.   She has agreed to increase physical activity in their day and reduce sedentary time (increase NEAT).  We discussed tracking daily steps.  Pharmacotherapy We discussed various medication options to help Urania with her weight loss efforts and we both agreed to continuing Wegovy 1.7 mg [place] weekly..  ASSOCIATED CONDITIONS ADDRESSED TODAY  Polyphagia Assessment & Plan: Changed Saxenda to  [facility] 1.7 mg once weekly injection last visit Minimal GI side effects Improved satiety vs Saxenda Reviewed limiting meals out, high sugar foods, greasy foods to ease GI symptoms. Can use Pepcid AC for dyspepsia side effects.  Orders: -     Wegovy; Inject 1.7 mg into the skin once a week.  Dispense: 3 mL; Refill: 0  Morbid obesity (HCC) Assessment & Plan: Improving Net weight loss 36 pounds in the past 7 months of medically supervised weight management Reviewed bioimpedance results Has plans to resume water exercise and walking once her foot is healed   Prediabetes Assessment & Plan: Last A1c 5.8 November 16, 2021 Has been doing well with weight reduction, reducing intake of added sugar and adding in more regular walking Due  for recheck A1c today Currently not on metformin   Vitamin D deficiency Assessment & Plan: Due for recheck vitamin D level today Taking vitamin D 50,000 IU once weekly Last vitamin D level 21.3 November 16, 2021 Target vitamin D level 50-70  Orders: -     Vitamin D (Ergocalciferol); Take 1 capsule (50,000 Units total) by mouth every 7 (seven) days.  Dispense: 5 capsule; Refill: 0  Obesity,current BMI 46.9 -     Wegovy; Inject 1.7 mg into the skin once a week.  Dispense: 3 mL; Refill: 0      No follow-ups on file.Marland Kitchen She was informed of the importance of frequent follow up visits to maximize her success with intensive lifestyle modifications for her multiple health conditions.   ATTESTASTION STATEMENTS:  Reviewed by clinician on day of visit: allergies, medications, problem list, medical history, surgical history, family history, social history, and previous encounter notes.   Time spent on visit including pre-visit chart review and post-visit care and charting was 30 minutes.    Loyal Gambler DO

## 2022-06-21 ENCOUNTER — Encounter: Payer: Self-pay | Admitting: Orthopaedic Surgery

## 2022-06-21 ENCOUNTER — Ambulatory Visit (INDEPENDENT_AMBULATORY_CARE_PROVIDER_SITE_OTHER): Payer: Commercial Managed Care - PPO | Admitting: Orthopaedic Surgery

## 2022-06-21 DIAGNOSIS — M79644 Pain in right finger(s): Secondary | ICD-10-CM

## 2022-06-21 DIAGNOSIS — M79645 Pain in left finger(s): Secondary | ICD-10-CM | POA: Diagnosis not present

## 2022-06-21 NOTE — Progress Notes (Signed)
Office Visit Note   Patient: [name]           Date of Birth: Jan 02, 1971           MRN: [id number] Visit Date: 06/21/2022              Requested by: Biagio Borg, MD [address] [place],  [place] [address] PCP: Biagio Borg, MD   Assessment & Plan: Visit Diagnoses:  1. Pain of left thumb   2. Pain in right finger(s)     Plan: Impression is pain and slight swelling to the left thumb and right index finger with ingrown nail to the ulnar aspect of the right index finger.  At this point, we have recommended continuing her Keflex.  She will start using Aquaphor around these areas.  Should her symptoms worsen she will let us know.  No signs of infection at this time.  Otherwise, follow-up as needed.  Follow-Up Instructions: Return if symptoms worsen or fail to improve.   Orders:  No orders of the defined types were placed in this encounter.  No orders of the defined types were placed in this encounter.     Procedures: No procedures performed   Clinical Data: No additional findings.   Subjective: Chief Complaint  Patient presents with   Right Hand - Pain   Left Hand - Pain    HPI patient is a pleasant 52 year old right-hand-dominant female who comes in today with concerns about her right index finger and left thumb.  About a month ago she noticed increased pain, swelling and redness around the nailbed.  She began picking these areas as she felt as though a nail was growing into the skin.  She denies having any drainage.  She was put on Keflex this past Saturday for an ingrown toenail which she thinks may be helping her fingers.  She also tells me that she had her nails done several months ago.  She denies any fevers or chills or any other constitutional symptoms.  Review of Systems as detailed in HPI.  All others reviewed and are negative.   Objective: Vital Signs: LMP 11/02/2014   Physical Exam well-developed well-nourished female in no acute distress.   Alert and oriented x 3.  Ortho Exam right index finger and left thumb show irritation and slight swelling around the ulnar nailbed.  Slight erythema.  No drainage.  Pulp is soft.  She is neurovascular intact distally.  Specialty Comments:  No specialty comments available.  Imaging: No new imaging   PMFS History: Patient Active Problem List   Diagnosis Date Noted   Hoarseness 06/18/2022   Insulin resistance 05/18/2022   Major depressive disorder 03/09/2022   Polyphagia 03/09/2022   Left foot pain 02/16/2022   Osteoarthritis of both knees 01/13/2022   Hot flashes due to menopause 12/19/2021   ADD (attention deficit disorder) 12/19/2021   SOBOE (shortness of breath on exertion) 11/16/2021   Elevated glucose 11/16/2021   Vitamin D deficiency 11/16/2021   Prediabetes 11/16/2021   Bunion, left 08/06/2021   Morbid obesity ([place]) 06/26/2021   Venous insufficiency 06/25/2021   Incisional hernia 12/25/2020   Mediastinal lymphadenopathy    Pulmonary nodules 10/20/2020   Multiple skin nodules 07/24/2020   Right lumbar radiculopathy [id number]   Periumbilical hernia [id number]   TB lung, latent 05/18/2020   Acute non-recurrent maxillary sinusitis 11/09/2019   HTN (hypertension) 04/14/2019   Mass of left wrist 04/14/2019   External otitis of right ear 04/14/2019  Choledocholithiasis    Abnormal cholangiogram    Cholecystitis with cholelithiasis 07/24/2018   Hot flash not due to menopause 10/16/2017   Eczema 10/16/2017   Flu-like symptoms 06/20/2017   Cognitive changes 01/30/2017   Disturbed concentration 01/26/2017   Left shoulder pain 11/01/2016   Right low back pain 11/01/2016   Elevated blood pressure, situational 05/05/2016   Surgical menopause 01/29/2016   OSA on CPAP 12/22/2015   Cough 11/26/2015   Capsulitis 10/06/2015   Ganglion cyst of left foot 10/06/2015   Fatigue 09/13/2015   Asthma 09/08/2015   Exertional dyspnea 09/08/2015   Peroneal ganglion cyst 07/20/2015    Low back pain 07/20/2015   Peroneal tendinitis of left lower leg 06/25/2015   Nonallopathic lesion of cervical region 06/25/2015   Wheezing 05/08/2015   Polyarthralgia 05/08/2015   Peripheral edema 03/16/2015   Central line complication [id number]   Chemotherapy induced neutropenia ([place]) 02/28/2015   Screening for colorectal cancer 01/05/2015   Chemotherapy induced nausea and vomiting 01/02/2015   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   Premature surgical menopause 12/17/2014   Leukopenia due to antineoplastic chemotherapy ([place]) 12/17/2014   Port-A-Cath in place 12/17/2014   Encounter for antineoplastic chemotherapy 12/17/2014   Myalgia 12/09/2014   Hypersensitivity reaction 12/05/2014   Poor venous access 11/28/2014   Postoperative cellulitis of surgical wound 11/24/2014   Malignant neoplasm of both ovaries 11/10/2014   Anxiety 11/10/2014   Adnexal mass 11/10/2014   Encounter for well adult exam with abnormal findings 09/10/2014   Hypothyroidism 09/10/2014   Hypersomnolence 09/10/2014   Acute non-recurrent frontal sinusitis 06/24/2014   Right knee pain 06/24/2014   Lower back pain 01/08/2014   EAR PAIN, BILATERAL 12/18/2009   KNEE PAIN, RIGHT 12/18/2009   INGROWN TOENAIL 12/17/2008   BACK PAIN 12/17/2008   Acute bronchospasm 11/11/2008   Depression 11/29/2007   Insomnia 11/29/2007   Allergic rhinitis 04/13/2007   GERD 04/13/2007   Endometriosis determined by laparoscopy 04/13/2007   Migraine headache 01/29/2007   Past Medical History:  Diagnosis Date   Anxiety    Arthritis    Asthma    Back pain    Barrett's esophagus    Chemotherapy-induced neuropathy (HCC)    Chronic lower back pain    Depression    Dyspnea    Elevated blood pressure, situational 05/05/2016   Endometriosis    Exercise-induced asthma 09/08/2015   Fatigue    Gallbladder problem    GERD (gastroesophageal reflux disease)    Heartburn    History of kidney stones     Hypertension    Hypothyroidism    Infertility, female    Joint pain    Joint stiffness    Lower extremity edema    Migraine    ovarian cancer    Ovarian cancer, bilateral ([place]) 11/28/2014   PONV (postoperative nausea and vomiting)    Sarcoidosis    Sleep apnea    SOBOE (shortness of breath on exertion)    Thyroid disease    Umbilical hernia    Vitamin D deficiency    woke up while porta cath removed     Family History  Problem Relation Age of Onset   Hyperlipidemia Mother    Hypertension Mother    Diabetes Mother    Stroke Mother        Deceased   Heart disease Mother    Osteoarthritis Mother    Atrial fibrillation Father        Living   Stroke Father  optic nerve left eye   Healthy Brother    Heart failure Maternal Grandmother    Heart attack Maternal Grandfather    Dementia Paternal Grandmother    Dementia Paternal Grandfather    Diabetes Maternal Aunt     Past Surgical History:  Procedure Laterality Date   ABDOMINAL HYSTERECTOMY  11/10/2014   at [facility], Exp lap, supracervical hyst, BSO, infracolic omentectomy, lymphadenectomy, aortic lymph node sampling   BIOPSY  11/04/2021   Procedure: BIOPSY;  Surgeon: Jackquline Denmark, MD;  Location: [facility];  Service: Gastroenterology;;   BRONCHIAL NEEDLE ASPIRATION BIOPSY  12/16/2020   Procedure: BRONCHIAL NEEDLE ASPIRATION BIOPSIES;  Surgeon: Rigoberto Noel, MD;  Location: [facility];  Service: Cardiopulmonary;;   BRONCHIAL WASHINGS  12/16/2020   Procedure: BRONCHIAL WASHINGS;  Surgeon: Rigoberto Noel, MD;  Location: [facility];  Service: Cardiopulmonary;;   BUNIONECTOMY Left 09/14/2021   CHOLECYSTECTOMY N/A 07/24/2018   Procedure: LAPAROSCOPIC CHOLECYSTECTOMY WITH INTRAOPERATIVE CHOLANGIOGRAM;  Surgeon: Armandina Gemma, MD;  Location: [facility];  Service: General;  Laterality: N/A;   COLONOSCOPY WITH PROPOFOL N/A 11/04/2021   Procedure: COLONOSCOPY WITH PROPOFOL;  Surgeon: Jackquline Denmark, MD;  Location: [facility];   Service: Gastroenterology;  Laterality: N/A;   ERCP N/A 07/26/2018   Procedure: ENDOSCOPIC RETROGRADE CHOLANGIOPANCREATOGRAPHY (ERCP);  Surgeon: Ladene Artist, MD;  Location: [facility];  Service: Endoscopy;  Laterality: N/A;   ESOPHAGOGASTRODUODENOSCOPY (EGD) WITH PROPOFOL N/A 11/04/2021   Procedure: ESOPHAGOGASTRODUODENOSCOPY (EGD) WITH PROPOFOL;  Surgeon: Jackquline Denmark, MD;  Location: [facility];  Service: Gastroenterology;  Laterality: N/A;   GANGLION CYST EXCISION Right    hand   HYSTERECTOMY ABDOMINAL WITH SALPINGO-OOPHORECTOMY  11/10/2014   ovarian cancer, tumor removal   INCISIONAL HERNIA REPAIR N/A 12/25/2020   Procedure: LAPAROSCOPIC INCISIONAL HERNIA REPAIR WITH MESH;  Surgeon: Kinsinger, Arta Bruce, MD;  Location: [facility];  Service: General;  Laterality: N/A;   IR GENERIC HISTORICAL  05/12/2016   IR REMOVAL TUN ACCESS W/ PORT W/O FL MOD SED 05/12/2016 [facility]   LAPAROSCOPIC ENDOMETRIOSIS FULGURATION     LIPOMA EXCISION Left    ankle   meniscal tear Right    MENISCUS REPAIR Right    POLYPECTOMY  11/04/2021   Procedure: POLYPECTOMY;  Surgeon: Jackquline Denmark, MD;  Location: [facility];  Service: Gastroenterology;;   Glori Luis a cath removal     PORTA CATH INSERTION     REMOVAL OF STONES  07/26/2018   Procedure: REMOVAL OF STONES;  Surgeon: Ladene Artist, MD;  Location: [facility];  Service: Endoscopy;;   SPHINCTEROTOMY  07/26/2018   Procedure: Joan Mayans;  Surgeon: Ladene Artist, MD;  Location: [facility];  Service: Endoscopy;;   UMBILICAL HERNIA REPAIR  12/29/2020   VIDEO BRONCHOSCOPY WITH ENDOBRONCHIAL ULTRASOUND N/A 12/16/2020   Procedure: VIDEO BRONCHOSCOPY WITH ENDOBRONCHIAL ULTRASOUND;  Surgeon: Rigoberto Noel, MD;  Location: [facility];  Service: Cardiopulmonary;  Laterality: N/A;   Social History   Occupational History   Occupation: Teacher, early years/pre: [employer]  Tobacco Use   Smoking status: Never    Passive exposure: Past    Smokeless tobacco: Never  Vaping Use   Vaping Use: Never used  Substance and Sexual Activity   Alcohol use: Not Currently   Drug use: No   Sexual activity: Not on file

## 2022-06-21 NOTE — Transitions of Care (Post Inpatient/ED Visit) (Unsigned)
   06/21/2022  Name: [name] MRN: [id number] DOB: [birth year]  Today's TOC FU Call Status: Today's TOC FU Call Status:: Unsuccessul Call (1st Attempt) Unsuccessful Call (1st Attempt) Date: 06/20/22 Unsuccessful Call (2nd Attempt) Date: 06/21/22  Attempted to reach the patient regarding the most recent Inpatient/ED visit.  Follow Up Plan: Additional outreach attempts will be made to reach the patient to complete the Transitions of Care (Post Inpatient/ED visit) call.   Signature Juanda Crumble, [place] Direct Dial [phone]

## 2022-06-23 NOTE — Transitions of Care (Post Inpatient/ED Visit) (Signed)
   06/23/2022  Name: [name] MRN: [id number] DOB: May 04, 1971  Today's TOC FU Call Status: Today's TOC FU Call Status:: Unsuccessful Call (3rd Attempt) Unsuccessful Call (1st Attempt) Date: 06/20/22 Unsuccessful Call (2nd Attempt) Date: 06/21/22 Unsuccessful Call (3rd Attempt) Date: 06/23/22  Attempted to reach the patient regarding the most recent Inpatient/ED visit.  Follow Up Plan: No further outreach attempts will be made at this time. We have been unable to contact the patient.  Signature Juanda Crumble, [place] Direct Dial [phone]

## 2022-06-24 ENCOUNTER — Ambulatory Visit: Payer: Commercial Managed Care - PPO | Admitting: Podiatry

## 2022-06-24 ENCOUNTER — Other Ambulatory Visit (HOSPITAL_BASED_OUTPATIENT_CLINIC_OR_DEPARTMENT_OTHER): Payer: Self-pay

## 2022-06-24 DIAGNOSIS — L600 Ingrowing nail: Secondary | ICD-10-CM

## 2022-06-24 MED ORDER — AMOXICILLIN 500 MG PO CAPS
500.0000 mg | ORAL_CAPSULE | Freq: Three times a day (TID) | ORAL | 1 refills | Status: DC
  Filled 2022-06-24: qty 30, 10d supply, fill #0

## 2022-06-24 NOTE — Patient Instructions (Signed)

## 2022-06-24 NOTE — Progress Notes (Unsigned)
Subjective: Chief Complaint  Patient presents with   Nail Problem   52 year old female presents the office with above concerns.  She recently ingrown toenail and she had the lateral portion removed urgent care but she presents today for further evaluation and possible female location to help with regrowth.  She has had a history of ingrown toenails.  Currently denies any drainage or pus.  She has not been soaking.  No other concerns.  Objective: AAO x3, NAD DP/PT pulses palpable bilaterally, CRT less than 3 seconds Incurvation present to the medial aspect of the right hallux toenail there is localized edema to both nail borders without any drainage or pus.  No fluctuance or crepitation but there is no malodor. No pain with calf compression, swelling, warmth, erythema  Assessment: Right hallux ingrown toenail  Plan: -All treatment options discussed with the patient including all alternatives, risks, complications.  -We discussed repeat partial nail avulsion with chemical matricectomy to help with pain regrowth to both medial lateral nail borders and she wants to proceed with this. -At this time, the patient is requesting partial nail removal with chemical matricectomy to the symptomatic portion of the nail. Risks and complications were discussed with the patient for which they understand and written consent was obtained. Under sterile conditions a total of 3 mL of a mixture of 2% lidocaine plain and 0.5% Marcaine plain was infiltrated in a hallux block fashion. Once anesthetized, the skin was prepped in sterile fashion. A tourniquet was then applied. Next the medial, lateral aspect of hallux nail border was then sharply excised making sure to remove the entire offending nail border. Once the nails were ensured to be removed area was debrided and the underlying skin was intact. There is no purulence identified in the procedure. Next phenol was then applied under standard conditions and copiously  irrigated.  Antibiotic ointment was applied. A dry sterile dressing was applied. After application of the dressing the tourniquet was removed and there is found to be an immediate capillary refill time to the digit. The patient tolerated the procedure well any complications. Post procedure instructions were discussed the patient for which he verbally understood. Discussed signs/symptoms of infection and directed to call the office immediately should any occur or go directly to the emergency room. In the meantime, encouraged to call the office with any questions, concerns, changes symptoms.   -Patient encouraged to call the office with any questions, concerns, change in symptoms.

## 2022-06-28 ENCOUNTER — Encounter: Payer: Self-pay | Admitting: Rheumatology

## 2022-06-28 ENCOUNTER — Other Ambulatory Visit (HOSPITAL_BASED_OUTPATIENT_CLINIC_OR_DEPARTMENT_OTHER): Payer: Self-pay

## 2022-06-28 ENCOUNTER — Other Ambulatory Visit: Payer: Self-pay | Admitting: Internal Medicine

## 2022-06-28 ENCOUNTER — Ambulatory Visit: Payer: Commercial Managed Care - PPO | Attending: Rheumatology | Admitting: Rheumatology

## 2022-06-28 VITALS — BP 122/75 | HR 76 | Resp 14 | Ht 67.0 in | Wt 303.0 lb

## 2022-06-28 DIAGNOSIS — M19049 Primary osteoarthritis, unspecified hand: Secondary | ICD-10-CM | POA: Insufficient documentation

## 2022-06-28 DIAGNOSIS — M19041 Primary osteoarthritis, right hand: Secondary | ICD-10-CM

## 2022-06-28 DIAGNOSIS — D869 Sarcoidosis, unspecified: Secondary | ICD-10-CM

## 2022-06-28 DIAGNOSIS — M5416 Radiculopathy, lumbar region: Secondary | ICD-10-CM

## 2022-06-28 DIAGNOSIS — R21 Rash and other nonspecific skin eruption: Secondary | ICD-10-CM

## 2022-06-28 DIAGNOSIS — M170 Bilateral primary osteoarthritis of knee: Secondary | ICD-10-CM | POA: Diagnosis not present

## 2022-06-28 DIAGNOSIS — Z9889 Other specified postprocedural states: Secondary | ICD-10-CM

## 2022-06-28 DIAGNOSIS — G5603 Carpal tunnel syndrome, bilateral upper limbs: Secondary | ICD-10-CM | POA: Diagnosis not present

## 2022-06-28 DIAGNOSIS — G4733 Obstructive sleep apnea (adult) (pediatric): Secondary | ICD-10-CM

## 2022-06-28 DIAGNOSIS — Z79899 Other long term (current) drug therapy: Secondary | ICD-10-CM

## 2022-06-28 DIAGNOSIS — J4521 Mild intermittent asthma with (acute) exacerbation: Secondary | ICD-10-CM

## 2022-06-28 DIAGNOSIS — R768 Other specified abnormal immunological findings in serum: Secondary | ICD-10-CM

## 2022-06-28 DIAGNOSIS — E039 Hypothyroidism, unspecified: Secondary | ICD-10-CM

## 2022-06-28 DIAGNOSIS — H538 Other visual disturbances: Secondary | ICD-10-CM | POA: Insufficient documentation

## 2022-06-28 DIAGNOSIS — K115 Sialolithiasis: Secondary | ICD-10-CM | POA: Diagnosis not present

## 2022-06-28 DIAGNOSIS — E8940 Asymptomatic postprocedural ovarian failure: Secondary | ICD-10-CM

## 2022-06-28 DIAGNOSIS — Z8719 Personal history of other diseases of the digestive system: Secondary | ICD-10-CM

## 2022-06-28 DIAGNOSIS — C563 Malignant neoplasm of bilateral ovaries: Secondary | ICD-10-CM | POA: Diagnosis not present

## 2022-06-28 DIAGNOSIS — M19042 Primary osteoarthritis, left hand: Secondary | ICD-10-CM

## 2022-06-28 DIAGNOSIS — M1812 Unilateral primary osteoarthritis of first carpometacarpal joint, left hand: Secondary | ICD-10-CM | POA: Insufficient documentation

## 2022-06-28 DIAGNOSIS — G43001 Migraine without aura, not intractable, with status migrainosus: Secondary | ICD-10-CM

## 2022-06-28 DIAGNOSIS — Z227 Latent tuberculosis: Secondary | ICD-10-CM

## 2022-06-28 DIAGNOSIS — R5383 Other fatigue: Secondary | ICD-10-CM

## 2022-06-28 DIAGNOSIS — G8929 Other chronic pain: Secondary | ICD-10-CM

## 2022-06-28 DIAGNOSIS — T451X5A Adverse effect of antineoplastic and immunosuppressive drugs, initial encounter: Secondary | ICD-10-CM

## 2022-06-28 DIAGNOSIS — M545 Low back pain, unspecified: Secondary | ICD-10-CM

## 2022-06-28 DIAGNOSIS — I10 Essential (primary) hypertension: Secondary | ICD-10-CM

## 2022-06-28 DIAGNOSIS — G620 Drug-induced polyneuropathy: Secondary | ICD-10-CM

## 2022-06-28 DIAGNOSIS — R918 Other nonspecific abnormal finding of lung field: Secondary | ICD-10-CM

## 2022-06-28 MED ORDER — AMPHETAMINE-DEXTROAMPHET ER 20 MG PO CP24
20.0000 mg | ORAL_CAPSULE | Freq: Every day | ORAL | 0 refills | Status: DC
  Filled 2022-06-28: qty 30, 30d supply, fill #0

## 2022-06-28 NOTE — Patient Instructions (Addendum)
Standing Labs We placed an order today for your standing lab work.   Please have your standing labs drawn in May and every 3 months  Please have your labs drawn 2 weeks prior to your appointment so that the provider can discuss your lab results at your appointment.  Please note that you may see your imaging and lab results in [facility] before we have reviewed them. We will contact you once all results are reviewed. Please allow our office up to 72 hours to thoroughly review all of the results before contacting the office for clarification of your results.  Lab hours are:   Monday through Thursday from 8:00 am -12:30 pm and 1:00 pm-5:00 pm and Friday from 8:00 am-12:00 pm.  Please be advised, all patients with office appointments requiring lab work will take precedent over walk-in lab work.   Labs are drawn by [facility]. Please bring your co-pay at the time of your lab draw.  You may receive a bill from [facility] for your lab work.  Please note if you are on Hydroxychloroquine and and an order has been placed for a Hydroxychloroquine level, you will need to have it drawn 4 hours or more after your last dose.  If you wish to have your labs drawn at another location, please call the office 24 hours in advance so we can fax the orders.  The office is located at [address] [place], [place], [place] [address] No appointment is necessary.    If you have any questions regarding directions or hours of operation,  please call [phone].   As a reminder, please drink plenty of water prior to coming for your lab work. Thanks!   Vaccines You are taking a medication(s) that can suppress your immune system.  The following immunizations are recommended: Flu annually Covid-19  RSV Td/Tdap (tetanus, diphtheria, pertussis) every 10 years Pneumonia (Prevnar 15 then Pneumovax 23 at least 1 year apart.  Alternatively, can take Prevnar 20 without needing additional dose) Shingrix: 2 doses from 4 weeks  to 6 months apart  Please check with your PCP to make sure you are up to date.   If you have signs or symptoms of an infection or start antibiotics: First, call your PCP for workup of your infection. Hold your medication through the infection, until you complete your antibiotics, and until symptoms resolve if you take the following: Injectable medication (Actemra, Benlysta, Cimzia, Cosentyx, Enbrel, Humira, Kevzara, Orencia, Remicade, Simponi, Stelara, Taltz, Tremfya) Methotrexate Leflunomide (Arava) Mycophenolate (Cellcept) Morrie Sheldon, Olumiant, or Rinvoq   Back Exercises The following exercises strengthen the muscles that help to support the trunk (torso) and back. They also help to keep the lower back flexible. Doing these exercises can help to prevent or lessen existing low back pain. If you have back pain or discomfort, try doing these exercises 2-3 times each day or as told by your health care provider. As your pain improves, do them once each day, but increase the number of times that you repeat the steps for each exercise (do more repetitions). To prevent the recurrence of back pain, continue to do these exercises once each day or as told by your health care provider. Do exercises exactly as told by your health care provider and adjust them as directed. It is normal to feel mild stretching, pulling, tightness, or discomfort as you do these exercises, but you should stop right away if you feel sudden pain or your pain gets worse. Exercises Single knee to chest Repeat  these steps 3-5 times for each leg: Lie on your back on a firm bed or the floor with your legs extended. Bring one knee to your chest. Your other leg should stay extended and in contact with the floor. Hold your knee in place by grabbing your knee or thigh with both hands and hold. Pull on your knee until you feel a gentle stretch in your lower back or buttocks. Hold the stretch for 10-30 seconds. Slowly release and  straighten your leg.  Pelvic tilt Repeat these steps 5-10 times: Lie on your back on a firm bed or the floor with your legs extended. Bend your knees so they are pointing toward the ceiling and your feet are flat on the floor. Tighten your lower abdominal muscles to press your lower back against the floor. This motion will tilt your pelvis so your tailbone points up toward the ceiling instead of pointing to your feet or the floor. With gentle tension and even breathing, hold this position for 5-10 seconds.  Cat-cow Repeat these steps until your lower back becomes more flexible: Get into a hands-and-knees position on a firm bed or the floor. Keep your hands under your shoulders, and keep your knees under your hips. You may place padding under your knees for comfort. Let your head hang down toward your chest. Contract your abdominal muscles and point your tailbone toward the floor so your lower back becomes rounded like the back of a cat. Hold this position for 5 seconds. Slowly lift your head, let your abdominal muscles relax, and point your tailbone up toward the ceiling so your back forms a sagging arch like the back of a cow. Hold this position for 5 seconds.  Press-ups Repeat these steps 5-10 times: Lie on your abdomen (face-down) on a firm bed or the floor. Place your palms near your head, about shoulder-width apart. Keeping your back as relaxed as possible and keeping your hips on the floor, slowly straighten your arms to raise the top half of your body and lift your shoulders. Do not use your back muscles to raise your upper torso. You may adjust the placement of your hands to make yourself more comfortable. Hold this position for 5 seconds while you keep your back relaxed. Slowly return to lying flat on the floor.  Bridges Repeat these steps 10 times: Lie on your back on a firm bed or the floor. Bend your knees so they are pointing toward the ceiling and your feet are flat on the  floor. Your arms should be flat at your sides, next to your body. Tighten your buttocks muscles and lift your buttocks off the floor until your waist is at almost the same height as your knees. You should feel the muscles working in your buttocks and the back of your thighs. If you do not feel these muscles, slide your feet 1-2 inches (2.5-5 cm) farther away from your buttocks. Hold this position for 3-5 seconds. Slowly lower your hips to the starting position, and allow your buttocks muscles to relax completely. If this exercise is too easy, try doing it with your arms crossed over your chest. Abdominal crunches Repeat these steps 5-10 times: Lie on your back on a firm bed or the floor with your legs extended. Bend your knees so they are pointing toward the ceiling and your feet are flat on the floor. Cross your arms over your chest. Tip your chin slightly toward your chest without bending your neck. Tighten your abdominal muscles  and slowly raise your torso high enough to lift your shoulder blades a tiny bit off the floor. Avoid raising your torso higher than that because it can put too much stress on your lower back and does not help to strengthen your abdominal muscles. Slowly return to your starting position.  Back lifts Repeat these steps 5-10 times: Lie on your abdomen (face-down) with your arms at your sides, and rest your forehead on the floor. Tighten the muscles in your legs and your buttocks. Slowly lift your chest off the floor while you keep your hips pressed to the floor. Keep the back of your head in line with the curve in your back. Your eyes should be looking at the floor. Hold this position for 3-5 seconds. Slowly return to your starting position.  Contact a health care provider if: Your back pain or discomfort gets much worse when you do an exercise. Your worsening back pain or discomfort does not lessen within 2 hours after you exercise. If you have any of these  problems, stop doing these exercises right away. Do not do them again unless your health care provider says that you can. Get help right away if: You develop sudden, severe back pain. If this happens, stop doing the exercises right away. Do not do them again unless your health care provider says that you can. This information is not intended to replace advice given to you by your health care provider. Make sure you discuss any questions you have with your health care provider. Document Revised: 10/20/2020 Document Reviewed: 07/08/2020 Elsevier Patient Education  [place].

## 2022-07-07 ENCOUNTER — Ambulatory Visit: Payer: Commercial Managed Care - PPO | Admitting: Orthopaedic Surgery

## 2022-07-12 ENCOUNTER — Other Ambulatory Visit: Payer: Self-pay

## 2022-07-12 ENCOUNTER — Encounter (INDEPENDENT_AMBULATORY_CARE_PROVIDER_SITE_OTHER): Payer: Self-pay | Admitting: Family Medicine

## 2022-07-12 ENCOUNTER — Other Ambulatory Visit (HOSPITAL_BASED_OUTPATIENT_CLINIC_OR_DEPARTMENT_OTHER): Payer: Self-pay

## 2022-07-12 ENCOUNTER — Telehealth (INDEPENDENT_AMBULATORY_CARE_PROVIDER_SITE_OTHER): Payer: Commercial Managed Care - PPO | Admitting: Family Medicine

## 2022-07-12 ENCOUNTER — Ambulatory Visit (INDEPENDENT_AMBULATORY_CARE_PROVIDER_SITE_OTHER): Payer: Commercial Managed Care - PPO | Admitting: Family Medicine

## 2022-07-12 DIAGNOSIS — Z6841 Body Mass Index (BMI) 40.0 and over, adult: Secondary | ICD-10-CM

## 2022-07-12 DIAGNOSIS — E559 Vitamin D deficiency, unspecified: Secondary | ICD-10-CM | POA: Diagnosis not present

## 2022-07-12 DIAGNOSIS — R632 Polyphagia: Secondary | ICD-10-CM | POA: Diagnosis not present

## 2022-07-12 DIAGNOSIS — F5089 Other specified eating disorder: Secondary | ICD-10-CM

## 2022-07-12 MED ORDER — VITAMIN D (ERGOCALCIFEROL) 1.25 MG (50000 UNIT) PO CAPS
50000.0000 [IU] | ORAL_CAPSULE | ORAL | 0 refills | Status: DC
  Filled 2022-07-12 – 2022-08-04 (×2): qty 5, 35d supply, fill #0

## 2022-07-12 MED ORDER — WEGOVY 2.4 MG/0.75ML ~~LOC~~ SOAJ
2.4000 mg | SUBCUTANEOUS | 1 refills | Status: DC
  Filled 2022-07-12: qty 3, 28d supply, fill #0
  Filled 2022-08-04: qty 3, 28d supply, fill #1

## 2022-07-12 NOTE — Progress Notes (Addendum)
TeleHealth Visit:  This visit was completed with telemedicine (audio/video) technology. [name] has verbally consented to this TeleHealth visit. The patient is located at home, the provider is located at home. The participants in this visit include the listed provider and patient. The visit was conducted today via MyChart video.  OBESITY [name] is here to discuss her progress with her obesity treatment plan along with follow-up of her obesity related diagnoses.   Today's visit was # 10 Starting Date: 11/16/21 Starting Weight: 333lb Weight at last in office visit: 297 lbs on 06/20/22 Total weight loss: 36 lbs at last in office visit on 06/20/22. Today's reported weight:  No weight reported.  Nutrition Plan: practicing portion control and making smarter food choices, such as increasing vegetables and decreasing simple carbohydrates   Interim History:  Struggles to get in enough protein. Often not hungry enough for 3 meals per day. Still experiencing some hunger and cravings at other times of the day. She expressed some interest in bariatric surgery and we talked about this briefly.  I encouraged her to sign up for the online seminar.  Advise it is a great tool for weight loss but does not stand alone without good eating habits.  Eating all of the prescribed protein: no Skipping meals: Yes- sometimes lunch or dinner. Drinking adequate water: Yes Drinking sugar sweetened beverages: No Hunger controlled: moderately controlled. Cravings controlled:  moderately controlled.   Pharmacotherapy: Teasa is on Wegovy 1.7 SQ weekly Adverse side effects:  nausea and diarrhea has subsided.  Hunger is moderately controlled.  Cravings are moderately controlled.  Assessment/Plan:  1. Polyphagia Aeisha endorses excessive hunger.  Medication(s): Wegovy 1.7 mg weekly.  Side effects of diarrhea and nausea have subsided. She is concerned about what will happen when she can no longer get her  Wegovy due to lack of coverage.  Plan: Refill and increase dose-Wegovy 2.4 mg weekly with 1 refill. She will try to get 2 refills of the prescription before April 1 when coverage stops. Discussed options such as Qsymia briefly. She will discuss options further with Dr. Valetta Close at next visit.   2. Vitamin D Deficiency Vitamin D is at goal of 50.  Most recent vitamin D level was 50 on 06/17/22.. She is on  prescription ergocalciferol 50,000 IU weekly. Lab Results  Component Value Date   VD25OH 50 06/17/2022   VD25OH 21.3 (L) 11/16/2021   VD25OH 13.51 (L) 06/25/2021    Plan: Refill prescription vitamin D 50,000 IU weekly.   3. Eating disorder/emotional eating [name] has had issues with stress/emotional eating.  Admits to some nonhunger eating.  She is interested in counseling. Currently this is moderately controlled. Overall mood is stable. Medication(s): Bupropion XL 150 mg daily, Xanax 2 times daily as needed for anxiety, sertraline 100 mg daily.  All prescribed by PCP.  Plan: Advised her we can set her up with Dr. Mallie Mussel at her next in office visit.  She will need to complete new patient paperwork   4. Morbid Obesity: Current BMI 46.1 Pharmacotherapy Plan Continue and increase dose  Wegovy 2.4 mg SQ weekly [name] is currently in the action stage of change. As such, her goal is to continue with weight loss efforts.  She has agreed to practicing portion control and making smarter food choices, such as increasing vegetables and decreasing simple carbohydrates and Other 80 g of protein daily .  Exercise goals: All adults should avoid inactivity. Some physical activity is better than none, and adults who participate in any  amount of physical activity gain some health benefits.  Behavioral modification strategies: increasing lean protein intake, no meal skipping, and meal planning .  Stefanie has agreed to follow-up with our clinic in 3 weeks.   No orders of the defined types were  placed in this encounter.   Medications Discontinued During This Encounter  Medication Reason   Vitamin D, Ergocalciferol, (DRISDOL) 1.25 MG (50000 UNIT) CAPS capsule Reorder   Semaglutide-Weight Management (WEGOVY) 1.7 MG/0.75ML SOAJ Dose change     Meds ordered this encounter  Medications   Semaglutide-Weight Management (WEGOVY) 2.4 MG/0.75ML SOAJ    Sig: Inject 2.4 mg into the skin once a week.    Dispense:  3 mL    Refill:  1    Order Specific Question:   Supervising Provider    Answer:   Netty Starring   Vitamin D, Ergocalciferol, (DRISDOL) 1.25 MG (50000 UNIT) CAPS capsule    Sig: Take 1 capsule (50,000 Units total) by mouth every 7 (seven) days.    Dispense:  5 capsule    Refill:  0    Order Specific Question:   Supervising Provider    Answer:   Dell Ponto [[id number]]      Objective:   VITALS: Per patient if applicable, see vitals. GENERAL: Alert and in no acute distress. CARDIOPULMONARY: No increased WOB. Speaking in clear sentences.  PSYCH: Pleasant and cooperative. Speech normal rate and rhythm. Affect is appropriate. Insight and judgement are appropriate. Attention is focused, linear, and appropriate.  NEURO: Oriented as arrived to appointment on time with no prompting.   Attestation Statements:   Reviewed by clinician on day of visit: allergies, medications, problem list, medical history, surgical history, family history, social history, and previous encounter notes.   This was prepared with the assistance of Presenter, broadcasting.  Occasional wrong-word or sound-a-like substitutions may have occurred due to the inherent limitations of voice recognition software.

## 2022-07-14 ENCOUNTER — Ambulatory Visit: Payer: Commercial Managed Care - PPO | Admitting: Orthopaedic Surgery

## 2022-07-14 ENCOUNTER — Ambulatory Visit: Payer: Commercial Managed Care - PPO | Admitting: Podiatry

## 2022-07-14 ENCOUNTER — Ambulatory Visit (INDEPENDENT_AMBULATORY_CARE_PROVIDER_SITE_OTHER): Payer: Commercial Managed Care - PPO

## 2022-07-14 ENCOUNTER — Encounter: Payer: Self-pay | Admitting: Orthopaedic Surgery

## 2022-07-14 DIAGNOSIS — M1812 Unilateral primary osteoarthritis of first carpometacarpal joint, left hand: Secondary | ICD-10-CM

## 2022-07-14 DIAGNOSIS — G5602 Carpal tunnel syndrome, left upper limb: Secondary | ICD-10-CM

## 2022-07-14 NOTE — Progress Notes (Signed)
Office Visit Note   Patient: [name]           Date of Birth: 1970-09-20           MRN: [id number] Visit Date: 07/14/2022              Requested by: Sara Merino, MD [address] [place],  [place] [address] PCP: Sara Borg, MD   Assessment & Plan: Visit Diagnoses:  1. Left carpal tunnel syndrome   2. Primary osteoarthritis of first carpometacarpal joint of left hand     Plan: Impression is left carpal tunnel syndrome.  EMGs in 2017 showed moderate disease and her symptoms have now gotten worse.  Based on her options she has elected for carpal tunnel release.  Risk benefits prognosis reviewed.  Impression the left thumb pain is early CMC arthritis.  Treatment options explained and she would like to try a steroid injection in the operating theater while she is under anesthesia.  [name] will call the patient to confirm surgery time.  Follow-Up Instructions: No follow-ups on file.   Orders:  Orders Placed This Encounter  Procedures   XR Hand Complete Left   No orders of the defined types were placed in this encounter.     Procedures: No procedures performed   Clinical Data: No additional findings.   Subjective: Chief Complaint  Patient presents with   Left Wrist - Pain    HPI  [name] comes in today for follow-up of bilateral carpal tunnel syndrome worse on the left hand.  Also reporting thenar pain with grasping and lifting.    Review of Systems  Constitutional: Negative.   HENT: Negative.    Eyes: Negative.   Respiratory: Negative.    Cardiovascular: Negative.   Endocrine: Negative.   Musculoskeletal: Negative.   Neurological: Negative.   Hematological: Negative.   Psychiatric/Behavioral: Negative.    All other systems reviewed and are negative.    Objective: Vital Signs: LMP 11/02/2014   Physical Exam Vitals and nursing note reviewed.  Constitutional:      Appearance: She is well-developed.  HENT:     Head:  Normocephalic and atraumatic.  Pulmonary:     Effort: Pulmonary effort is normal.  Abdominal:     Palpations: Abdomen is soft.  Musculoskeletal:     Cervical back: Neck supple.  Skin:    General: Skin is warm.     Capillary Refill: Capillary refill takes less than 2 seconds.  Neurological:     Mental Status: She is alert and oriented to person, place, and time.  Psychiatric:        Behavior: Behavior normal.        Thought Content: Thought content normal.        Judgment: Judgment normal.     Ortho Exam  Examination of left hand shows positive carpal tunnel compressive signs.  No muscle atrophy.  Pain with thumb CMC grind test.  Specialty Comments:  No specialty comments available.  Imaging: XR Hand Complete Left  Result Date: 07/14/2022 No acute or structural abnormalities    PMFS History: Patient Active Problem List   Diagnosis Date Noted   Blurry vision, bilateral 06/28/2022   Osteoarthritis, hand 06/28/2022   Hoarseness 06/18/2022   Insulin resistance 05/18/2022   Major depressive disorder 03/09/2022   Polyphagia 03/09/2022   Left foot pain 02/16/2022   Osteoarthritis of both knees 01/13/2022   Hot flashes due to menopause 12/19/2021   ADD (attention deficit disorder) 12/19/2021  SOBOE (shortness of breath on exertion) 11/16/2021   Elevated glucose 11/16/2021   Vitamin D deficiency 11/16/2021   Prediabetes 11/16/2021   Bunion, left 08/06/2021   Morbid obesity ([place]) 06/26/2021   Venous insufficiency 06/25/2021   Incisional hernia 12/25/2020   Mediastinal lymphadenopathy    Pulmonary nodules 10/20/2020   Multiple skin nodules 07/24/2020   Right lumbar radiculopathy [id number]   Periumbilical hernia [id number]   TB lung, latent 05/18/2020   Acute non-recurrent maxillary sinusitis 11/09/2019   HTN (hypertension) 04/14/2019   Mass of left wrist 04/14/2019   External otitis of right ear 04/14/2019   Choledocholithiasis    Abnormal cholangiogram     Cholecystitis with cholelithiasis 07/24/2018   Hot flash not due to menopause 10/16/2017   Eczema 10/16/2017   Flu-like symptoms 06/20/2017   Cognitive changes 01/30/2017   Disturbed concentration 01/26/2017   Left shoulder pain 11/01/2016   Right low back pain 11/01/2016   Elevated blood pressure, situational 05/05/2016   Surgical menopause 01/29/2016   OSA on CPAP 12/22/2015   Cough 11/26/2015   Capsulitis 10/06/2015   Ganglion cyst of left foot 10/06/2015   Fatigue 09/13/2015   Asthma 09/08/2015   Exertional dyspnea 09/08/2015   Peroneal ganglion cyst 07/20/2015   Low back pain 07/20/2015   Peroneal tendinitis of left lower leg 06/25/2015   Nonallopathic lesion of cervical region 06/25/2015   Wheezing 05/08/2015   Polyarthralgia 05/08/2015   Peripheral edema 03/16/2015   Central line complication [id number]   Chemotherapy induced neutropenia ([place]) 02/28/2015   Screening for colorectal cancer 01/05/2015   Chemotherapy induced nausea and vomiting 01/02/2015   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   Premature surgical menopause 12/17/2014   Leukopenia due to antineoplastic chemotherapy ([place]) 12/17/2014   Port-A-Cath in place 12/17/2014   Encounter for antineoplastic chemotherapy 12/17/2014   Myalgia 12/09/2014   Hypersensitivity reaction 12/05/2014   Poor venous access 11/28/2014   Postoperative cellulitis of surgical wound 11/24/2014   Malignant neoplasm of both ovaries 11/10/2014   Anxiety 11/10/2014   Adnexal mass 11/10/2014   Encounter for well adult exam with abnormal findings 09/10/2014   Hypothyroidism 09/10/2014   Hypersomnolence 09/10/2014   Acute non-recurrent frontal sinusitis 06/24/2014   Right knee pain 06/24/2014   Lower back pain 01/08/2014   EAR PAIN, BILATERAL 12/18/2009   KNEE PAIN, RIGHT 12/18/2009   INGROWN TOENAIL 12/17/2008   BACK PAIN 12/17/2008   Acute bronchospasm 11/11/2008   Depression 11/29/2007   Insomnia  11/29/2007   Allergic rhinitis 04/13/2007   GERD 04/13/2007   Endometriosis determined by laparoscopy 04/13/2007   Migraine headache 01/29/2007   Past Medical History:  Diagnosis Date   Anxiety    Arthritis    Asthma    Back pain    Barrett's esophagus    Chemotherapy-induced neuropathy ([facility])    Chronic lower back pain    Depression    Dyspnea    Elevated blood pressure, situational 05/05/2016   Endometriosis    Exercise-induced asthma 09/08/2015   Fatigue    Gallbladder problem    GERD (gastroesophageal reflux disease)    Heartburn    History of kidney stones    Hypertension    Hypothyroidism    Infertility, female    Joint pain    Joint stiffness    Lower extremity edema    Migraine    ovarian cancer    Ovarian cancer, bilateral ([place]) 11/28/2014   PONV (postoperative nausea and vomiting)    Sarcoidosis  Sleep apnea    SOBOE (shortness of breath on exertion)    Thyroid disease    Umbilical hernia    Vitamin D deficiency    woke up while porta cath removed     Family History  Problem Relation Age of Onset   Hyperlipidemia Mother    Hypertension Mother    Diabetes Mother    Stroke Mother        Deceased   Heart disease Mother    Osteoarthritis Mother    Atrial fibrillation Father        Living   Stroke Father        optic nerve left eye   Healthy Brother    Heart failure Maternal Grandmother    Heart attack Maternal Grandfather    Dementia Paternal Grandmother    Dementia Paternal Grandfather    Diabetes Maternal Aunt     Past Surgical History:  Procedure Laterality Date   ABDOMINAL HYSTERECTOMY  11/10/2014   at [facility], Exp lap, supracervical hyst, BSO, infracolic omentectomy, lymphadenectomy, aortic lymph node sampling   BIOPSY  11/04/2021   Procedure: BIOPSY;  Surgeon: Jackquline Denmark, MD;  Location: [facility];  Service: Gastroenterology;;   BRONCHIAL NEEDLE ASPIRATION BIOPSY  12/16/2020   Procedure: BRONCHIAL NEEDLE ASPIRATION BIOPSIES;   Surgeon: Rigoberto Noel, MD;  Location: [facility];  Service: Cardiopulmonary;;   BRONCHIAL WASHINGS  12/16/2020   Procedure: BRONCHIAL WASHINGS;  Surgeon: Rigoberto Noel, MD;  Location: [facility];  Service: Cardiopulmonary;;   BUNIONECTOMY Left 09/14/2021   CHOLECYSTECTOMY N/A 07/24/2018   Procedure: LAPAROSCOPIC CHOLECYSTECTOMY WITH INTRAOPERATIVE CHOLANGIOGRAM;  Surgeon: Armandina Gemma, MD;  [facility]: [facility];  Service: General;  Laterality: N/A;   COLONOSCOPY WITH PROPOFOL N/A 11/04/2021   Procedure: COLONOSCOPY WITH PROPOFOL;  Surgeon: Jackquline Denmark, MD;  [facility];  Service: Gastroenterology;  Laterality: N/A;   ERCP N/A 07/26/2018   Procedure: ENDOSCOPIC RETROGRADE CHOLANGIOPANCREATOGRAPHY (ERCP);  Surgeon: Ladene Artist, MD;  Location[facility];  Service: Endoscopy;  Laterality: N/A;   ESOPHAGOGASTRODUODENOSCOPY (EGD) WITH PROPOFOL N/A 11/04/2021   Procedure: ESOPHAGOGASTRODUODENOSCOPY (EGD) WITH PROPOFOL;  Surgeon: Jackquline Denmark, MD;  [facility];  Service: Gastroenterology;  Laterality: N/A;   GANGLION CYST EXCISION Right    hand   HYSTERECTOMY ABDOMINAL WITH SALPINGO-OOPHORECTOMY  11/10/2014   ovarian cancer, tumor removal   INCISIONAL HERNIA REPAIR N/A 12/25/2020   Procedure: LAPAROSCOPIC INCISIONAL HERNIA REPAIR WITH MESH;  Surgeon: Kinsinger, Arta Bruce, MD;  [facility];  Service: General;  Laterality: N/A;   ingrown nail removal     IR GENERIC HISTORICAL  05/12/2016   IR REMOVAL TUN ACCESS W/ PORT W/O FL MOD SED 05/12/2016 [facility]   LAPAROSCOPIC ENDOMETRIOSIS FULGURATION     LIPOMA EXCISION Left    ankle   meniscal tear Right    MENISCUS REPAIR Right    oral sugery     POLYPECTOMY  11/04/2021   Procedure: POLYPECTOMY;  Surgeon: Jackquline Denmark, MD;  Location: [facility];  Service: Gastroenterology;;   Glori Luis a cath removal     PORTA CATH INSERTION     REMOVAL OF STONES  07/26/2018   Procedure: REMOVAL OF STONES;  Surgeon: Ladene Artist, MD;  Location: [facility];  Service: Endoscopy;;   SPHINCTEROTOMY  07/26/2018   Procedure: Joan Mayans;  Surgeon: Ladene Artist, MD;  [facility];  Service: Endoscopy;;   UMBILICAL HERNIA REPAIR  12/29/2020   VIDEO BRONCHOSCOPY WITH ENDOBRONCHIAL ULTRASOUND N/A 12/16/2020   Procedure: VIDEO  BRONCHOSCOPY WITH ENDOBRONCHIAL ULTRASOUND;  Surgeon: Rigoberto Noel, MD;  Location: [facility];  Service: Cardiopulmonary;  Laterality: N/A;   Social History   Occupational History   Occupation: Teacher, early years/pre:   Tobacco Use   Smoking status: Never    Passive exposure: Past   Smokeless tobacco: Never  Vaping Use   Vaping Use: Never used  Substance and Sexual Activity   Alcohol use: Not Currently   Drug use: No   Sexual activity: Not on file

## 2022-07-20 ENCOUNTER — Other Ambulatory Visit: Payer: Self-pay | Admitting: Internal Medicine

## 2022-07-21 ENCOUNTER — Other Ambulatory Visit (HOSPITAL_BASED_OUTPATIENT_CLINIC_OR_DEPARTMENT_OTHER): Payer: Self-pay

## 2022-07-21 ENCOUNTER — Other Ambulatory Visit: Payer: Self-pay

## 2022-07-21 MED ORDER — FLUTICASONE-SALMETEROL 250-50 MCG/ACT IN AEPB
1.0000 | INHALATION_SPRAY | Freq: Two times a day (BID) | RESPIRATORY_TRACT | 3 refills | Status: DC
  Filled 2022-07-21: qty 180, 90d supply, fill #0
  Filled 2022-11-09: qty 180, 90d supply, fill #1
  Filled 2023-05-01: qty 180, 90d supply, fill #2

## 2022-07-22 ENCOUNTER — Other Ambulatory Visit: Payer: Self-pay | Admitting: Internal Medicine

## 2022-07-22 DIAGNOSIS — F419 Anxiety disorder, unspecified: Secondary | ICD-10-CM

## 2022-07-25 ENCOUNTER — Other Ambulatory Visit (HOSPITAL_BASED_OUTPATIENT_CLINIC_OR_DEPARTMENT_OTHER): Payer: Self-pay

## 2022-07-25 ENCOUNTER — Encounter: Payer: Self-pay | Admitting: Oncology

## 2022-07-25 MED ORDER — ALPRAZOLAM 1 MG PO TABS
1.0000 mg | ORAL_TABLET | Freq: Two times a day (BID) | ORAL | 2 refills | Status: DC | PRN
  Filled 2022-07-25: qty 60, 30d supply, fill #0
  Filled 2022-08-29: qty 60, 30d supply, fill #1
  Filled 2022-09-28: qty 60, 30d supply, fill #2

## 2022-07-27 ENCOUNTER — Other Ambulatory Visit (HOSPITAL_BASED_OUTPATIENT_CLINIC_OR_DEPARTMENT_OTHER): Payer: Self-pay

## 2022-07-27 ENCOUNTER — Other Ambulatory Visit: Payer: Self-pay

## 2022-07-27 ENCOUNTER — Encounter: Payer: Self-pay | Admitting: Neurology

## 2022-07-27 ENCOUNTER — Ambulatory Visit: Payer: Commercial Managed Care - PPO | Admitting: Neurology

## 2022-07-27 VITALS — BP 136/90 | HR 78 | Resp 20 | Ht 67.0 in | Wt 297.0 lb

## 2022-07-27 DIAGNOSIS — S39012A Strain of muscle, fascia and tendon of lower back, initial encounter: Secondary | ICD-10-CM | POA: Diagnosis not present

## 2022-07-27 DIAGNOSIS — M5416 Radiculopathy, lumbar region: Secondary | ICD-10-CM | POA: Diagnosis not present

## 2022-07-27 DIAGNOSIS — G5603 Carpal tunnel syndrome, bilateral upper limbs: Secondary | ICD-10-CM

## 2022-07-27 DIAGNOSIS — G620 Drug-induced polyneuropathy: Secondary | ICD-10-CM

## 2022-07-27 DIAGNOSIS — T451X5A Adverse effect of antineoplastic and immunosuppressive drugs, initial encounter: Secondary | ICD-10-CM

## 2022-07-27 MED ORDER — CYCLOBENZAPRINE HCL 5 MG PO TABS
5.0000 mg | ORAL_TABLET | Freq: Two times a day (BID) | ORAL | 3 refills | Status: DC | PRN
  Filled 2022-07-27: qty 90, 45d supply, fill #0
  Filled 2022-09-28: qty 90, 45d supply, fill #1
  Filled 2022-12-09: qty 90, 45d supply, fill #2
  Filled 2023-02-16: qty 90, 45d supply, fill #3

## 2022-07-27 MED ORDER — GABAPENTIN 600 MG PO TABS
900.0000 mg | ORAL_TABLET | Freq: Two times a day (BID) | ORAL | 3 refills | Status: DC
  Filled 2022-07-27 – 2022-09-28 (×4): qty 270, 90d supply, fill #0
  Filled 2023-01-12: qty 270, 90d supply, fill #1
  Filled 2023-05-01: qty 270, 90d supply, fill #2

## 2022-07-27 NOTE — Patient Instructions (Signed)
Nerve testing of the hands  Start physical therapy for low back pain  Increase gabapentin to 900mg  twice daily  You may take flexeril 5mg  twice daily as needed  Return to clinic 6 months

## 2022-07-27 NOTE — Progress Notes (Signed)
Follow-up Visit   Date: 07/27/22    [name] MRN: [id number] DOB: 03/04/1971   Interim History: [name] is a 52 y.o. right-handed Caucasian female with ovarian cancer s/p bilateral SPO and hysterectomy and chemotherapy (11/2014),  hypothyroidism, endometriosis, migraines, and anxiety returning to the clinic for follow-up of chemotherapy-induced neuropathy, lumbar strain, and carpal tunnel syndrome.   IMPRESSION/PLAN: Bilateral carpal tunnel syndrome.  Prior NCS/EMG from 2017 shows moderate bilateral CTS. No improvement with using wrist braces.  Repeat NCS/EMG will be checked to look for interval change.  She may consider surgery based on these results.   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  Neuropathy is well-controlled on gabapentin 600mg  TID.  She admits that occasionally skipping the mid-day dose and does not notice a marked change.  No falls. She has ongoing hand paresthesias from CTS and uses wrist braces.  Her low back continues to be achy and she takes flexeril at bedtime.  She has noticed that occasionally, she has burning and tingling which radiates across the low back on the right side, it does not travel into the leg.   UPDATE 07/27/2022:  She is here for 6 month visit.  Today, she reports that her overall symptoms of hand tingling, neuropathy, and low back pain is worse.   She was unable to do PT for low back pain due to her work schedule, but is interested in doing this now.  She has noticed increased tingling in the hands, especially in the left hand, where it  is worse in the 4th and 5th finger.  She does admit to resting her left arm on the desk and sleeps on her side with arms flexed.  She is having more tingling/sensation of feet asleep.  At her last visit, gabapentin was reduced to 600mg  BID, however, she has become more symptomatic at this dose.   Medications:  Current Outpatient Medications on File Prior to Visit  Medication Sig Dispense Refill   ALPRAZolam (XANAX) 1 MG tablet Take 1 tablet (1 mg total) by mouth 2 (two) times daily as needed for anxiety. 60 tablet 2   amphetamine-dextroamphetamine (ADDERALL XR) 20 MG 24 hr capsule Take 1 capsule (20 mg total) by mouth daily. 30 capsule 0   Blood Pressure Monitoring (OMRON 3 SERIES BP MONITOR) DEVI Use as directed. 1 each 0   buPROPion (WELLBUTRIN XL) 150 MG 24 hr tablet Take 1 tablet (150 mg total) by mouth daily. 90 tablet 3   clobetasol cream (TEMOVATE) [id number] % Apply 1 Application topically 2 (two) times daily as needed. 45 g 0   cyclobenzaprine (FLEXERIL) 5 MG tablet Take 1 tablet (5 mg total) by mouth at bedtime as needed for muscle spasms. 90 tablet 3   estradiol (ESTRACE) 1 MG tablet Take 1 tablet (1 mg total) by mouth daily. 90 tablet 3   fluticasone-salmeterol (ADVAIR DISKUS) 250-50 MCG/ACT AEPB Inhale 1 puff into the lungs in the morning and at bedtime. [id number] each 3   folic acid (FOLVITE) 1 MG tablet Take 2 tablets (2 mg total) by mouth daily. 180 tablet 3   gabapentin (NEURONTIN) 600 MG tablet Take  1 tablet (600 mg total) by mouth 2 (two) times daily. OK to take extra dose as needed 210 tablet 3   levothyroxine (SYNTHROID) 100 MCG tablet Take 1 tablet (100 mcg total) by mouth daily. 90 tablet 3   losartan (COZAAR) 25 MG tablet Take 1 tablet (25 mg total) by mouth daily. 90 tablet 3   methotrexate 50 MG/2ML injection Inject 0.8 mLs (20 mg total) into the skin once a week. 10 mL 0   omeprazole (PRILOSEC) 40 MG capsule Take 1 capsule (40 mg total) by mouth daily. 30 capsule 3    oxyCODONE-acetaminophen (PERCOCET) 5-325 MG tablet Take 1 tablet by mouth every 6 (six) hours as needed for severe pain. 12 tablet 0   polyvinyl alcohol (ARTIFICIAL TEARS) 1.4 % ophthalmic solution Place 1 drop into both eyes as needed for dry eyes. 15 mL 0   Semaglutide-Weight Management (WEGOVY) 2.4 MG/0.75ML SOAJ Inject 2.4 mg into the skin once a week. 3 mL 1   sertraline (ZOLOFT) 100 MG tablet TAKE 2 TABLETS (200 MG TOTAL) BY MOUTH DAILY. (Patient taking differently: Take 100 mg by mouth in the morning and at bedtime.) 180 tablet 3   traZODone (DESYREL) 50 MG tablet Take 0.5-1 tablets (25-50 mg total) by mouth at bedtime as needed for sleep. 90 tablet 0   Vitamin D, Ergocalciferol, (DRISDOL) 1.25 MG (50000 UNIT) CAPS capsule Take 1 capsule (50,000 Units total) by mouth every 7 (seven) days. 5 capsule 0   Zoster Vaccine Adjuvanted [facility]) injection Inject into the muscle. 0.5 mL 0   amoxicillin (AMOXIL) 500 MG capsule Take 1 capsule (500 mg total) by mouth 3 (three) times daily until all taken for infection (Patient not taking: Reported on 07/27/2022) 30 capsule 1   benzonatate (TESSALON) 100 MG capsule Take by mouth 3 (three) times daily as needed for cough. (Patient not taking: Reported on 07/27/2022)     No current facility-administered medications on file prior to visit.    Allergies:  Allergies  Allergen Reactions   Moxifloxacin Anaphylaxis    needs epinephrine shot   Quinolones Anaphylaxis, Shortness Of Breath and Swelling   Sulfonamide Derivatives Hives, Swelling and Rash    needs epinephrine shot--rash and lip swelling   Doxycycline Nausea And Vomiting   Escitalopram Oxalate Other (See Comments)     fatigue   Lisinopril Cough    Vital Signs:  BP (!) 136/90   Pulse 78   Resp 20   Ht 5\' 7"  (1.702 m)   Wt 297 lb (134.7 kg)   LMP 11/02/2014   SpO2 98%   BMI 46.52 kg/m   Neurological Exam: MENTAL STATUS including orientation to time, place, person, recent and remote  memory, attention span and concentration, language, and fund of knowledge is normal.  Speech is not dysarthric.  CRANIAL NERVES:   Extraocular muscular muscles intact.  No ptosis.    MOTOR:  Motor strength shows 5/5 motor strength throughout.   SENSATION:  Vibration intact at the hands and knees, absent below the ankles.  Tinel's sign is positive at the left wrist only.   MSRs:  Right                                                                 Left brachioradialis  2+  brachioradialis 2+  biceps 2+  biceps 2+  triceps 1+  triceps 1+  Patellar 1+  Patellar 1+  ankle jerk 0  ankle jerk 0  plantar response down  plantar response down   COORDINATION/GAIT:   Gait wide-based, stable. Unassited  Data: Lab Results  Component Value Date   TSH 3.36 06/17/2022   NCS/EMG of the upper extremities 11/24/2015:  Bilateral median neuropathy at or distal to the wrist, consistent with clinical diagnosis of carpal tunnel syndrome. Overall, these findings are moderate in degree electrically    Thank you for allowing me to participate in patient's care.  If I can answer any additional questions, I would be pleased to do so.    Sincerely,    Devra Stare K. Posey Pronto, DO

## 2022-08-04 ENCOUNTER — Other Ambulatory Visit: Payer: Self-pay | Admitting: Internal Medicine

## 2022-08-04 ENCOUNTER — Other Ambulatory Visit (HOSPITAL_BASED_OUTPATIENT_CLINIC_OR_DEPARTMENT_OTHER): Payer: Self-pay

## 2022-08-04 ENCOUNTER — Other Ambulatory Visit: Payer: Self-pay

## 2022-08-04 MED ORDER — AMPHETAMINE-DEXTROAMPHET ER 20 MG PO CP24
20.0000 mg | ORAL_CAPSULE | Freq: Every day | ORAL | 0 refills | Status: DC
  Filled 2022-08-04: qty 30, 30d supply, fill #0

## 2022-08-04 MED ORDER — TRAZODONE HCL 50 MG PO TABS
25.0000 mg | ORAL_TABLET | Freq: Every evening | ORAL | 1 refills | Status: DC | PRN
  Filled 2022-08-04: qty 90, 90d supply, fill #0
  Filled 2022-11-09: qty 90, 90d supply, fill #1

## 2022-08-11 ENCOUNTER — Ambulatory Visit (INDEPENDENT_AMBULATORY_CARE_PROVIDER_SITE_OTHER): Payer: Commercial Managed Care - PPO | Admitting: Family Medicine

## 2022-08-11 ENCOUNTER — Encounter (INDEPENDENT_AMBULATORY_CARE_PROVIDER_SITE_OTHER): Payer: Self-pay | Admitting: Family Medicine

## 2022-08-11 ENCOUNTER — Other Ambulatory Visit (HOSPITAL_BASED_OUTPATIENT_CLINIC_OR_DEPARTMENT_OTHER): Payer: Self-pay

## 2022-08-11 DIAGNOSIS — F5089 Other specified eating disorder: Secondary | ICD-10-CM | POA: Diagnosis not present

## 2022-08-11 DIAGNOSIS — H16223 Keratoconjunctivitis sicca, not specified as Sjogren's, bilateral: Secondary | ICD-10-CM | POA: Diagnosis not present

## 2022-08-11 DIAGNOSIS — F509 Eating disorder, unspecified: Secondary | ICD-10-CM | POA: Insufficient documentation

## 2022-08-11 DIAGNOSIS — E559 Vitamin D deficiency, unspecified: Secondary | ICD-10-CM | POA: Diagnosis not present

## 2022-08-11 DIAGNOSIS — Z6841 Body Mass Index (BMI) 40.0 and over, adult: Secondary | ICD-10-CM

## 2022-08-11 MED ORDER — VITAMIN D (ERGOCALCIFEROL) 1.25 MG (50000 UNIT) PO CAPS
50000.0000 [IU] | ORAL_CAPSULE | ORAL | 0 refills | Status: DC
  Filled 2022-08-11: qty 5, 35d supply, fill #0

## 2022-08-11 MED ORDER — WEGOVY 2.4 MG/0.75ML ~~LOC~~ SOAJ
2.4000 mg | SUBCUTANEOUS | 0 refills | Status: DC
  Filled 2022-08-11: qty 3, 28d supply, fill #0
  Filled 2022-08-11 (×2): qty 9, 84d supply, fill #0
  Filled 2022-08-29: qty 3, 28d supply, fill #0
  Filled ????-??-??: fill #0

## 2022-08-11 NOTE — Progress Notes (Signed)
Office: [phone]  /  Fax: [place]  Starting Date: 11/16/21  Starting Weight: 333lb   Weight Lost Since Last Visit: 6lb   Vitals Temp: 97.6 F (36.4 C) BP: 117/80 Pulse Rate: 88 SpO2: 97 %   Body Composition  Body Fat %: 56 % Fat Mass (lbs): 163 lbs Muscle Mass (lbs): 121.6 lbs Visceral Fat Rating : 19   HPI  Chief Complaint: OBESITY  Deltha is here to discuss her progress with her obesity treatment plan. She is on the practicing portion control and making smarter food choices, such as increasing vegetables and decreasing simple carbohydrates and states she is following her eating plan approximately 100 % of the time. She states she is exercising 15 minutes 3 times per week.   Interval History:  Since last office visit she is down 6 lb This gives her a net weight loss of 42 lb in 9 mos This is a 12.6% TBW loss She has done some hiking.  Endurance has improved. She has improved fullness with Wegovy 2.4 mg weekly She had one incident of N/V after consuming junk food Considering weight loss surgery as a last resort Struggles with hunger, food noise and cravings- improving on Wegovy Will be running out of insurance coverage for [facility] 4/30  Pharmacotherapy: Wegovy 2.4 weekly  PHYSICAL EXAM:  Blood pressure 117/80, pulse 88, temperature 97.6 F (36.4 C), height 5\' 7"  (1.702 m), weight 291 lb (132 kg), last menstrual period 11/02/2014, SpO2 97 %. Body mass index is 45.58 kg/m.  General: She is overweight, cooperative, alert, well developed, and in no acute distress. PSYCH: Has normal mood, affect and thought process.   Lungs: Normal breathing effort, no conversational dyspnea.   ASSESSMENT AND PLAN  TREATMENT PLAN FOR OBESITY:  Recommended Dietary Goals  Eisa is currently in the action stage of change. As such, her goal is to continue weight management plan. She has agreed to practicing portion control and making  smarter food choices, such as increasing vegetables and decreasing simple carbohydrates.  Behavioral Intervention  We discussed the following Behavioral Modification Strategies today: increasing lean protein intake, increasing fiber rich foods, avoiding skipping meals, increasing water intake, work on meal planning and preparation, planning for success, and keeping healthy foods at home.  Additional resources provided today: NA  Recommended Physical Activity Goals  Dafnee has been advised to work up to 150 minutes of moderate intensity aerobic activity a week and strengthening exercises 2-3 times per week for cardiovascular health, weight loss maintenance and preservation of muscle mass.   She has agreed to Work on scheduling and tracking physical activity.   Pharmacotherapy changes for the treatment of obesity: Wegovy 2.4 mg weekly-- refilled  ASSOCIATED CONDITIONS ADDRESSED TODAY  Morbid obesity: Starting BMI 52.1 Assessment & Plan: Overall, down 42 lb in 9 mos Reviewed her bioimpedence results Tolerating Wegovy 2.4 mg weekly injection well Reviewed nutrient intake and reminded her to eat on a schedule, increase her dietary intake of fiber and aim for 100+ g of protein daily Has room to ramp up consistency with walking/ exercise time.  Reviewed options for bariatric surgery including VSG and RYGB surgery. She is not ready for this at this time. She is NOT a candidate for phentermine or Qsymia due to her use of Adderall. She is NOT a candidate for Contrave due to her use of Wellbutrtin  Continue Wegovy 2.4 mg weekly injection.  Plan for coming off once insurance stops covering 4/30.  Orders: -     Wegovy; Inject 2.4 mg into the skin once a week.  Dispense: 9 mL; Refill: 0  Vitamin D deficiency Assessment & Plan: Last vitamin D Lab Results  Component Value Date   VD25OH 10 06/17/2022   Doing well on RX vitamin D 50,000 IU weekly Energy level is improving  Refilled RX  vitamin D 50,000 IU weekly.  Recheck lab in 2-3 mos.  Orders: -     Vitamin D (Ergocalciferol); Take 1 capsule (50,000 Units total) by mouth every 7 (seven) days.  Dispense: 5 capsule; Refill: 0  BMI 45.0-49.9, adult  Other disorder of eating/emotional eating Assessment & Plan: She has struggled with emotional eating with a hx of underlying depression. Currently on Wellbutrin XL 150 mg daily. Has a good support system at home.  Plan: add CBT with Dr Mallie Mussel.  Work on stress reduction, adequate nutrient intake (increasing dietary fiber instead of trying to stay super 'low carb') Keep trigger foods out of th house and aim for 8 hrs of sleep at night.       She was informed of the importance of frequent follow up visits to maximize her success with intensive lifestyle modifications for her multiple health conditions.   ATTESTASTION STATEMENTS:  Reviewed by clinician on day of visit: allergies, medications, problem list, medical history, surgical history, family history, social history, and previous encounter notes pertinent to obesity diagnosis.   I have personally spent 30 minutes total time today in preparation, patient care, nutritional counseling and documentation for this visit, including the following: review of clinical lab tests; review of medical tests/procedures/services.      Dell Ponto, DO DABFM, DABOM [facility] [address] [place], [place] [id number] [phone]

## 2022-08-11 NOTE — Assessment & Plan Note (Signed)
Overall, down 42 lb in 9 mos Reviewed her bioimpedence results Tolerating Wegovy 2.4 mg weekly injection well Reviewed nutrient intake and reminded her to eat on a schedule, increase her dietary intake of fiber and aim for 100+ g of protein daily Has room to ramp up consistency with walking/ exercise time.  Reviewed options for bariatric surgery including VSG and RYGB surgery. She is not ready for this at this time. She is NOT a candidate for phentermine or Qsymia due to her use of Adderall. She is NOT a candidate for Contrave due to her use of Wellbutrtin  Continue Wegovy 2.4 mg weekly injection.  Plan for coming off once insurance stops covering 4/30.

## 2022-08-11 NOTE — Assessment & Plan Note (Signed)
She has struggled with emotional eating with a hx of underlying depression. Currently on Wellbutrin XL 150 mg daily. Has a good support system at home.  Plan: add CBT with Dr Mallie Mussel.  Work on stress reduction, adequate nutrient intake (increasing dietary fiber instead of trying to stay super 'low carb') Keep trigger foods out of th house and aim for 8 hrs of sleep at night.

## 2022-08-11 NOTE — Assessment & Plan Note (Signed)
Last vitamin D Lab Results  Component Value Date   VD25OH 76 06/17/2022   Doing well on RX vitamin D 50,000 IU weekly Energy level is improving  Refilled RX vitamin D 50,000 IU weekly.  Recheck lab in 2-3 mos.

## 2022-08-12 ENCOUNTER — Other Ambulatory Visit: Payer: Self-pay | Admitting: Internal Medicine

## 2022-08-12 ENCOUNTER — Other Ambulatory Visit (HOSPITAL_BASED_OUTPATIENT_CLINIC_OR_DEPARTMENT_OTHER): Payer: Self-pay

## 2022-08-12 ENCOUNTER — Ambulatory Visit
Admission: RE | Admit: 2022-08-12 | Discharge: 2022-08-12 | Disposition: A | Discharge status: Home / Self Care | Payer: Commercial Managed Care - PPO | Source: Ambulatory Visit | Attending: Internal Medicine | Admitting: Internal Medicine

## 2022-08-12 DIAGNOSIS — Z1231 Encounter for screening mammogram for malignant neoplasm of breast: Secondary | ICD-10-CM | POA: Diagnosis not present

## 2022-08-12 MED ORDER — SERTRALINE HCL 100 MG PO TABS
200.0000 mg | ORAL_TABLET | Freq: Every day | ORAL | 3 refills | Status: DC
  Filled 2022-08-12: qty 180, 90d supply, fill #0
  Filled 2022-11-09: qty 180, 90d supply, fill #1
  Filled 2023-02-16: qty 180, 90d supply, fill #2
  Filled 2023-05-22: qty 180, 90d supply, fill #3

## 2022-08-15 ENCOUNTER — Other Ambulatory Visit: Payer: Self-pay | Admitting: Physician Assistant

## 2022-08-15 ENCOUNTER — Other Ambulatory Visit: Payer: Self-pay

## 2022-08-15 ENCOUNTER — Other Ambulatory Visit (HOSPITAL_BASED_OUTPATIENT_CLINIC_OR_DEPARTMENT_OTHER): Payer: Self-pay

## 2022-08-15 MED ORDER — METHOTREXATE SODIUM CHEMO INJECTION 50 MG/2ML
20.0000 mg | INTRAMUSCULAR | 0 refills | Status: DC
  Filled 2022-08-15: qty 10, 84d supply, fill #0

## 2022-08-15 NOTE — Telephone Encounter (Signed)
Last Fill: 05/11/2022  Labs: 05/10/2022 CBC and CMP WNL   Next Visit: 11/29/2022  Last Visit: 06/28/2022  DX: Sarcoidosis   Current Dose per office note 06/28/2022: methotrexate 0.8 mL subcu injections once weekly   Okay to refill Methotrexate?

## 2022-08-15 NOTE — Telephone Encounter (Signed)
Due to update lab work

## 2022-08-30 ENCOUNTER — Other Ambulatory Visit (HOSPITAL_BASED_OUTPATIENT_CLINIC_OR_DEPARTMENT_OTHER): Payer: Self-pay

## 2022-08-30 ENCOUNTER — Other Ambulatory Visit: Payer: Self-pay

## 2022-08-31 ENCOUNTER — Telehealth: Payer: Self-pay | Admitting: Orthopaedic Surgery

## 2022-08-31 NOTE — Telephone Encounter (Signed)
Patient is scheduled 09-07-22 at [facility] for left carpal tunnel release & left thumb carpometacarpal injection with Dr. Roda Shutters.  She would like to know if she will be wearing a brace after the surgery.  Also she would like to get an idea of the recovery time.  Please contact patient at [phone].  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

## 2022-09-01 ENCOUNTER — Other Ambulatory Visit: Payer: Self-pay

## 2022-09-01 ENCOUNTER — Encounter (HOSPITAL_BASED_OUTPATIENT_CLINIC_OR_DEPARTMENT_OTHER): Payer: Self-pay | Admitting: Orthopedic Surgery

## 2022-09-01 NOTE — Telephone Encounter (Signed)
Called and LMOM with information.

## 2022-09-01 NOTE — Telephone Encounter (Signed)
3-4 weeks recovery.  No brace after surgery

## 2022-09-01 NOTE — Progress Notes (Signed)
   09/01/22 4098  PAT Phone Screen  Is the patient taking a GLP-1 receptor agonist? (S)  Yes (last dose Tuesday 4/23)  Has the patient been informed on holding medication? (S)  Yes  Do You Have Diabetes? No  Do You Have Hypertension? Yes  Have You Ever Been to the ER for Asthma? No  Do you Take Phenteramine or any Other Diet Drugs? No  Recent  Lab Work, EKG, CXR? Yes  Where was this test performed? EKG 11/2021 [facility]  Do you have a history of heart problems? No  Any Recent Hospitalizations? No  Height  (1.702 m)  Weight 131.5 kg  Pat Appointment Scheduled No  Reason for No Appointment Not Needed

## 2022-09-02 ENCOUNTER — Telehealth: Payer: Self-pay | Admitting: Orthopaedic Surgery

## 2022-09-02 NOTE — Telephone Encounter (Signed)
Patient came in to fill out Auth form and pay $25 cash for [employer]

## 2022-09-05 ENCOUNTER — Telehealth (INDEPENDENT_AMBULATORY_CARE_PROVIDER_SITE_OTHER): Payer: Commercial Managed Care - PPO | Admitting: Psychology

## 2022-09-05 ENCOUNTER — Encounter: Payer: Self-pay | Admitting: Neurology

## 2022-09-05 ENCOUNTER — Other Ambulatory Visit: Payer: Self-pay | Admitting: Internal Medicine

## 2022-09-05 ENCOUNTER — Other Ambulatory Visit: Payer: Self-pay

## 2022-09-05 DIAGNOSIS — F5089 Other specified eating disorder: Secondary | ICD-10-CM

## 2022-09-05 DIAGNOSIS — F4323 Adjustment disorder with mixed anxiety and depressed mood: Secondary | ICD-10-CM | POA: Diagnosis not present
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
## 2022-09-07 ENCOUNTER — Ambulatory Visit (HOSPITAL_BASED_OUTPATIENT_CLINIC_OR_DEPARTMENT_OTHER): Payer: Commercial Managed Care - PPO | Admitting: Anesthesiology

## 2022-09-07 ENCOUNTER — Encounter (HOSPITAL_BASED_OUTPATIENT_CLINIC_OR_DEPARTMENT_OTHER)
Admission: RE | Disposition: A | Discharge status: Home / Self Care | Payer: Self-pay | Source: Home / Self Care | Attending: Orthopaedic Surgery

## 2022-09-07 ENCOUNTER — Ambulatory Visit (HOSPITAL_BASED_OUTPATIENT_CLINIC_OR_DEPARTMENT_OTHER)
Admission: RE | Admit: 2022-09-07 | Discharge: 2022-09-07 | Disposition: A | Discharge status: Home / Self Care | Payer: Commercial Managed Care - PPO | Attending: Orthopaedic Surgery | Admitting: Orthopaedic Surgery

## 2022-09-07 ENCOUNTER — Encounter (HOSPITAL_BASED_OUTPATIENT_CLINIC_OR_DEPARTMENT_OTHER): Payer: Self-pay | Admitting: Orthopaedic Surgery

## 2022-09-07 ENCOUNTER — Other Ambulatory Visit (HOSPITAL_BASED_OUTPATIENT_CLINIC_OR_DEPARTMENT_OTHER): Payer: Self-pay

## 2022-09-07 ENCOUNTER — Other Ambulatory Visit: Payer: Self-pay

## 2022-09-07 DIAGNOSIS — K219 Gastro-esophageal reflux disease without esophagitis: Secondary | ICD-10-CM | POA: Diagnosis not present

## 2022-09-07 DIAGNOSIS — R519 Headache, unspecified: Secondary | ICD-10-CM | POA: Diagnosis not present

## 2022-09-07 DIAGNOSIS — G4730 Sleep apnea, unspecified: Secondary | ICD-10-CM | POA: Diagnosis not present

## 2022-09-07 DIAGNOSIS — M1812 Unilateral primary osteoarthritis of first carpometacarpal joint, left hand: Secondary | ICD-10-CM | POA: Diagnosis not present

## 2022-09-07 DIAGNOSIS — Z9989 Dependence on other enabling machines and devices: Secondary | ICD-10-CM | POA: Diagnosis not present

## 2022-09-07 DIAGNOSIS — I10 Essential (primary) hypertension: Secondary | ICD-10-CM | POA: Diagnosis not present

## 2022-09-07 DIAGNOSIS — G4733 Obstructive sleep apnea (adult) (pediatric): Secondary | ICD-10-CM | POA: Diagnosis not present

## 2022-09-07 DIAGNOSIS — G5602 Carpal tunnel syndrome, left upper limb: Secondary | ICD-10-CM

## 2022-09-07 DIAGNOSIS — E039 Hypothyroidism, unspecified: Secondary | ICD-10-CM | POA: Diagnosis not present

## 2022-09-07 DIAGNOSIS — M1990 Unspecified osteoarthritis, unspecified site: Secondary | ICD-10-CM | POA: Diagnosis not present

## 2022-09-07 HISTORY — PX: CARPAL TUNNEL RELEASE: SHX101

## 2022-09-07 SURGERY — CARPAL TUNNEL RELEASE
Anesthesia: Monitor Anesthesia Care | Site: Hand | Laterality: Left

## 2022-09-07 MED ORDER — HYDROCODONE-ACETAMINOPHEN 5-325 MG PO TABS
1.0000 | ORAL_TABLET | Freq: Four times a day (QID) | ORAL | 0 refills | Status: DC | PRN
  Filled 2022-09-07: qty 20, 5d supply, fill #0

## 2022-09-07 MED ORDER — LACTATED RINGERS IV SOLN: INTRAVENOUS | Status: DC

## 2022-09-07 MED ORDER — CEFAZOLIN IN SODIUM CHLORIDE 3-0.9 GM/100ML-% IV SOLN
3.0000 g | INTRAVENOUS | Status: AC
  Administered 2022-09-07: 3 g via INTRAVENOUS

## 2022-09-07 MED ORDER — ONDANSETRON HCL 4 MG/2ML IJ SOLN
INTRAMUSCULAR | Status: DC | PRN
  Administered 2022-09-07: 4 mg via INTRAVENOUS

## 2022-09-07 MED ORDER — ONDANSETRON HCL 4 MG/2ML IJ SOLN: 4.0000 mg | Freq: Once | INTRAMUSCULAR | Status: DC | PRN

## 2022-09-07 MED ORDER — FENTANYL CITRATE (PF) 100 MCG/2ML IJ SOLN
INTRAMUSCULAR | Status: AC
  Filled 2022-09-07: qty 2

## 2022-09-07 MED ORDER — PROPOFOL 10 MG/ML IV BOLUS
INTRAVENOUS | Status: DC | PRN
  Administered 2022-09-07: 50 mg via INTRAVENOUS

## 2022-09-07 MED ORDER — MIDAZOLAM HCL 2 MG/2ML IJ SOLN
INTRAMUSCULAR | Status: AC
  Filled 2022-09-07: qty 2

## 2022-09-07 MED ORDER — LIDOCAINE-EPINEPHRINE (PF) 1 %-1:200000 IJ SOLN
INTRAMUSCULAR | Status: AC
  Filled 2022-09-07: qty 30

## 2022-09-07 MED ORDER — LIDOCAINE-EPINEPHRINE (PF) 1 %-1:200000 IJ SOLN
INTRAMUSCULAR | Status: DC | PRN
  Administered 2022-09-07: 10 mL

## 2022-09-07 MED ORDER — CEFAZOLIN IN SODIUM CHLORIDE 3-0.9 GM/100ML-% IV SOLN
INTRAVENOUS | Status: AC
  Filled 2022-09-07: qty 100

## 2022-09-07 MED ORDER — MIDAZOLAM HCL 5 MG/5ML IJ SOLN
INTRAMUSCULAR | Status: DC | PRN
  Administered 2022-09-07: 2 mg via INTRAVENOUS

## 2022-09-07 MED ORDER — FENTANYL CITRATE (PF) 100 MCG/2ML IJ SOLN: 25.0000 ug | INTRAMUSCULAR | Status: DC | PRN

## 2022-09-07 MED ORDER — PROPOFOL 500 MG/50ML IV EMUL
INTRAVENOUS | Status: DC | PRN
  Administered 2022-09-07: 100 ug/kg/min via INTRAVENOUS

## 2022-09-07 MED ORDER — TRIAMCINOLONE ACETONIDE 40 MG/ML IJ SUSP
INTRAMUSCULAR | Status: AC
  Filled 2022-09-07: qty 5

## 2022-09-07 MED ORDER — ACETAMINOPHEN 10 MG/ML IV SOLN: 1000.0000 mg | Freq: Once | INTRAVENOUS | Status: DC | PRN

## 2022-09-07 MED ORDER — TRIAMCINOLONE ACETONIDE 40 MG/ML IJ SUSP
INTRAMUSCULAR | Status: DC | PRN
  Administered 2022-09-07: 1 mL

## 2022-09-07 SURGICAL SUPPLY — 45 items
BAND INSRT 18 STRL LF DISP RB (MISCELLANEOUS) ×2
BAND RUBBER #18 3X1/16 STRL (MISCELLANEOUS) ×2 IMPLANT
BLADE MINI RND TIP GREEN BEAV (BLADE) ×1 IMPLANT
BLADE SURG 15 STRL LF DISP TIS (BLADE) ×1 IMPLANT
BLADE SURG 15 STRL SS (BLADE) ×1
BNDG CMPR 5X3 KNIT ELC UNQ LF (GAUZE/BANDAGES/DRESSINGS) ×1
BNDG CMPR 9X4 STRL LF SNTH (GAUZE/BANDAGES/DRESSINGS) ×1
BNDG ELASTIC 3INX 5YD STR LF (GAUZE/BANDAGES/DRESSINGS) ×1 IMPLANT
BNDG ESMARK 4X9 LF (GAUZE/BANDAGES/DRESSINGS) ×1 IMPLANT
BRUSH SCRUB EZ PLAIN DRY (MISCELLANEOUS) ×1 IMPLANT
CANISTER SUCT 1200ML W/VALVE (MISCELLANEOUS) ×1 IMPLANT
CORD BIPOLAR FORCEPS 12FT (ELECTRODE) ×1 IMPLANT
COVER BACK TABLE 60X90IN (DRAPES) ×1 IMPLANT
CUFF TOURN SGL QUICK 18X4 (TOURNIQUET CUFF) ×1 IMPLANT
DRAPE EXTREMITY T 121X128X90 (DISPOSABLE) ×1 IMPLANT
DRAPE SURG 17X23 STRL (DRAPES) ×1 IMPLANT
DRAPE U-SHAPE 47X51 STRL (DRAPES) ×1 IMPLANT
DURAPREP 26ML APPLICATOR (WOUND CARE) ×1 IMPLANT
GAUZE SPONGE 4X4 12PLY STRL (GAUZE/BANDAGES/DRESSINGS) ×1 IMPLANT
GAUZE XEROFORM 1X8 LF (GAUZE/BANDAGES/DRESSINGS) ×1 IMPLANT
GLOVE BIOGEL PI IND STRL 7.5 (GLOVE) ×1 IMPLANT
GLOVE ECLIPSE 7.0 STRL STRAW (GLOVE) ×1 IMPLANT
GLOVE INDICATOR 7.0 STRL GRN (GLOVE) ×1 IMPLANT
GLOVE SURG SYN 7.5  E (GLOVE) ×2
GLOVE SURG SYN 7.5 E (GLOVE) ×2 IMPLANT
GLOVE SURG SYN 7.5 PF PI (GLOVE) ×2 IMPLANT
GOWN STRL REUS W/ TWL LRG LVL3 (GOWN DISPOSABLE) ×1 IMPLANT
GOWN STRL REUS W/TWL LRG LVL3 (GOWN DISPOSABLE) ×1
GOWN STRL SURGICAL XL XLNG (GOWN DISPOSABLE) ×2 IMPLANT
NDL HYPO 25X1 1.5 SAFETY (NEEDLE) ×1 IMPLANT
NEEDLE HYPO 25X1 1.5 SAFETY (NEEDLE) ×1 IMPLANT
NS IRRIG 1000ML POUR BTL (IV SOLUTION) ×1 IMPLANT
PACK BASIN DAY SURGERY FS (CUSTOM PROCEDURE TRAY) ×1 IMPLANT
PAD CAST 3X4 CTTN HI CHSV (CAST SUPPLIES) ×1 IMPLANT
PADDING CAST COTTON 3X4 STRL (CAST SUPPLIES) ×1
SHEET MEDIUM DRAPE 40X70 STRL (DRAPES) ×1 IMPLANT
SPIKE FLUID TRANSFER (MISCELLANEOUS) IMPLANT
SPONGE T-LAP 18X18 ~~LOC~~+RFID (SPONGE) ×1 IMPLANT
STOCKINETTE 4X48 STRL (DRAPES) ×1 IMPLANT
SUT ETHILON 3 0 PS 1 (SUTURE) ×1 IMPLANT
SYR BULB EAR ULCER 3OZ GRN STR (SYRINGE) ×1 IMPLANT
SYR CONTROL 10ML LL (SYRINGE) IMPLANT
TOWEL GREEN STERILE FF (TOWEL DISPOSABLE) ×1 IMPLANT
TRAY DSU PREP LF (CUSTOM PROCEDURE TRAY) ×1 IMPLANT
UNDERPAD 30X36 HEAVY ABSORB (UNDERPADS AND DIAPERS) ×1 IMPLANT

## 2022-09-07 NOTE — Op Note (Signed)
   Carpal tunnel op note  DATE OF SURGERY:09/07/2022  PREOPERATIVE DIAGNOSIS:   Left carpal tunnel syndrome Left thumb CMC osteoarthritis  POSTOPERATIVE DIAGNOSIS: same  PROCEDURE:  Left carpal tunnel release. CPT [phone] Left thumb CMC joint injection  SURGEON: Klyn Kroening Glee Arvin, M.D.  ASSIST: Oneal Grout, PA-C  ANESTHESIA:  Local and MAC  TOURNIQUET TIME: less than 20 minutes  BLOOD LOSS: Minimal.  COMPLICATIONS: None.  PATHOLOGY: None.  INDICATIONS: The patient is a 52 y.o. -year-old female who presented with carpal tunnel syndrome failing nonsurgical management, indicated for surgical release.  DESCRIPTION OF PROCEDURE: The patient was identified in the preoperative holding area.  The operative site was marked by the surgeon and confirmed by the patient.  The patient was brought back to the operating room.  MAC anesthesia was administered.  Local anesthetic with epi was injected into the operative site.  A well padded nonsterile tourniquet was placed. The operative extremity was prepped and draped in standard sterile fashion.  A timeout was performed.  Preoperative antibiotics were given.   A palmar incision was made about 5 mm ulnar to the thenar crease.  The palmar aponeurosis was exposed and divided in line with the skin incision. The palmaris brevis was visualized and divided.  The distal edge of the transcarpal ligament was identified. A hemostat was inserted into the carpal tunnel to protect the median nerve and the flexor tendons. Then, the transverse carpal ligament was released under direct visualization. Proximally, a subcutaneous tunnel was made allowing a Sewell retractor to be placed. Then, the distal portion of the antebrachial fascia was released. Distally, all fibrous bands were released. The median nerve was visualized, and the fat pad was exposed. Wound was irrigated and closed with 4-0 nylon sutures. We then injected the thumb CMC joint with a mixture of  1/3 cc each of marcaine, lidocaine and kenalog under sterile conditions.  Sterile dressing applied. The patient was transferred to the recovery room in stable condition after all counts were correct.  POSTOPERATIVE PLAN: To start nerve gliding exercises as tolerated and no heavy lifting for four weeks.  Glee Arvin, M.D. [facility] 2:01 PM

## 2022-09-07 NOTE — Transfer of Care (Signed)
Immediate Anesthesia Transfer of Care Note  Patient: [name]  Procedure(s) Performed: LEFT CARPAL TUNNEL RELEASE, LEFT THUMB CARPOMETACARPAL INJECTION (Left: Hand)  Patient Location: PACU  Anesthesia Type:MAC  Level of Consciousness: awake, alert , and oriented  Airway & Oxygen Therapy: Patient Spontanous Breathing  Post-op Assessment: Report given to RN and Post -op Vital signs reviewed and stable  Post vital signs: Reviewed and stable  Last Vitals:  Vitals Value Taken Time  BP 116/66   Temp    Pulse 91 09/07/22 1404  Resp 14   SpO2 96 % 09/07/22 1404  Vitals shown include unvalidated device data.  Last Pain:  Vitals:   09/07/22 1254  TempSrc: Oral  PainSc: 0-No pain         Complications: No notable events documented.

## 2022-09-07 NOTE — Anesthesia Preprocedure Evaluation (Addendum)
Anesthesia Evaluation  Patient identified by MRN, date of birth, ID band Patient awake    Reviewed: Allergy & Precautions, NPO status , Patient's Chart, lab work & pertinent test results  History of Anesthesia Complications (+) PONV  Airway Mallampati: II  TM Distance: >3 FB Neck ROM: Full    Dental no notable dental hx. (+) Teeth Intact, Dental Advisory Given   Pulmonary sleep apnea and Continuous Positive Airway Pressure Ventilation  SARCOIDOSISI   Pulmonary exam normal breath sounds clear to auscultation       Cardiovascular hypertension, Normal cardiovascular exam Rhythm:Regular Rate:Normal     Neuro/Psych  Headaches    GI/Hepatic ,GERD  Medicated and Controlled,,  Endo/Other  Hypothyroidism    Renal/GU      Musculoskeletal  (+) Arthritis ,    Abdominal   Peds  Hematology   Anesthesia Other Findings All:    Reproductive/Obstetrics                             Anesthesia Physical Anesthesia Plan  ASA: 3  Anesthesia Plan: MAC   Post-op Pain Management: Tylenol PO (pre-op)*, Ofirmev IV (intra-op)* and Minimal or no pain anticipated   Induction: Intravenous  PONV Risk Score and Plan: Propofol infusion and Treatment may vary due to age or medical condition  Airway Management Planned: Nasal Cannula and Natural Airway  Additional Equipment: None  Intra-op Plan:   Post-operative Plan:   Informed Consent: I have reviewed the patients History and Physical, chart, labs and discussed the procedure including the risks, benefits and alternatives for the proposed anesthesia with the patient or authorized representative who has indicated his/her understanding and acceptance.     Dental advisory given  Plan Discussed with:   Anesthesia Plan Comments:        Anesthesia Quick Evaluation

## 2022-09-07 NOTE — H&P (Signed)
PREOPERATIVE H&P  Chief Complaint: left carpal tunnel syndrome, left thumb carpometacarpal osteoarthritis  HPI: [name] is a 52 y.o. female who presents for surgical treatment of left carpal tunnel syndrome, left thumb carpometacarpal osteoarthritis.  She denies any changes in medical history.  Past Medical History:  Diagnosis Date   Anxiety    Arthritis    Asthma    Back pain    Barrett's esophagus    Chemotherapy-induced neuropathy (HCC)    Chronic lower back pain    Depression    Dyspnea    Elevated blood pressure, situational 05/05/2016   Endometriosis    Exercise-induced asthma 09/08/2015   Fatigue    Gallbladder problem    GERD (gastroesophageal reflux disease)    Heartburn    History of kidney stones    Hypertension    Hypothyroidism    Infertility, female    Joint pain    Joint stiffness    Lower extremity edema    Migraine    ovarian cancer    Ovarian cancer, bilateral (HCC) 11/28/2014   PONV (postoperative nausea and vomiting)    Sarcoidosis    Sleep apnea    SOBOE (shortness of breath on exertion)    Thyroid disease    Umbilical hernia    Vitamin D deficiency    woke up while porta cath removed    Past Surgical History:  Procedure Laterality Date   ABDOMINAL HYSTERECTOMY  11/10/2014   at [facility], Exp lap, supracervical hyst, BSO, infracolic omentectomy, lymphadenectomy, aortic lymph node sampling   BIOPSY  11/04/2021   Procedure: BIOPSY;  Surgeon: Lynann Bologna, MD;  Location: [facility] ENDOSCOPY;  Service: Gastroenterology;;   BRONCHIAL NEEDLE ASPIRATION BIOPSY  12/16/2020   Procedure: BRONCHIAL NEEDLE ASPIRATION BIOPSIES;  Surgeon: Oretha Milch, MD;  Location: [facility] ENDOSCOPY;  Service: Cardiopulmonary;;   BRONCHIAL WASHINGS  12/16/2020   Procedure: BRONCHIAL WASHINGS;  Surgeon: Oretha Milch, MD;  Location: [facility] ENDOSCOPY;  Service: Cardiopulmonary;;   BUNIONECTOMY Left 09/14/2021   CHOLECYSTECTOMY N/A 07/24/2018   Procedure: LAPAROSCOPIC  CHOLECYSTECTOMY WITH INTRAOPERATIVE CHOLANGIOGRAM;  Surgeon: Darnell Level, MD;  Location: [facility] ORS;  Service: General;  Laterality: N/A;   COLONOSCOPY WITH PROPOFOL N/A 11/04/2021   Procedure: COLONOSCOPY WITH PROPOFOL;  Surgeon: Lynann Bologna, MD;  Location: [facility] ENDOSCOPY;  Service: Gastroenterology;  Laterality: N/A;   ERCP N/A 07/26/2018   Procedure: ENDOSCOPIC RETROGRADE CHOLANGIOPANCREATOGRAPHY (ERCP);  Surgeon: Meryl Dare, MD;  Location: [facility] ENDOSCOPY;  Service: Endoscopy;  Laterality: N/A;   ESOPHAGOGASTRODUODENOSCOPY (EGD) WITH PROPOFOL N/A 11/04/2021   Procedure: ESOPHAGOGASTRODUODENOSCOPY (EGD) WITH PROPOFOL;  Surgeon: Lynann Bologna, MD;  Location: [facility] ENDOSCOPY;  Service: Gastroenterology;  Laterality: N/A;   GANGLION CYST EXCISION Right    hand   HYSTERECTOMY ABDOMINAL WITH SALPINGO-OOPHORECTOMY  11/10/2014   ovarian cancer, tumor removal   INCISIONAL HERNIA REPAIR N/A 12/25/2020   Procedure: LAPAROSCOPIC INCISIONAL HERNIA REPAIR WITH MESH;  Surgeon: Kinsinger, De Blanch, MD;  Location: [facility] ORS;  Service: General;  Laterality: N/A;   ingrown nail removal     IR GENERIC HISTORICAL  05/12/2016   IR REMOVAL TUN ACCESS W/ PORT W/O FL MOD SED 05/12/2016 [facility]-INTER[facility] RAD   LAPAROSCOPIC ENDOMETRIOSIS FULGURATION     LIPOMA EXCISION Left    ankle   meniscal tear Right    MENISCUS REPAIR Right    oral sugery     POLYPECTOMY  11/04/2021   Procedure: POLYPECTOMY;  Surgeon: Lynann Bologna, MD;  Location: [facility] ENDOSCOPY;  Service: Gastroenterology;;  Porta a cath removal     PORTA CATH INSERTION     REMOVAL OF STONES  07/26/2018   Procedure: REMOVAL OF STONES;  Surgeon: Meryl Dare, MD;  Location: [facility] ENDOSCOPY;  Service: Endoscopy;;   SPHINCTEROTOMY  07/26/2018   Procedure: Dennison Mascot;  Surgeon: Meryl Dare, MD;  Location: [facility] ENDOSCOPY;  Service: Endoscopy;;   UMBILICAL HERNIA REPAIR  12/29/2020   VIDEO BRONCHOSCOPY WITH ENDOBRONCHIAL ULTRASOUND N/A 12/16/2020   Procedure:  VIDEO BRONCHOSCOPY WITH ENDOBRONCHIAL ULTRASOUND;  Surgeon: Oretha Milch, MD;  Location: [facility] ENDOSCOPY;  Service: Cardiopulmonary;  Laterality: N/A;   Social History   Socioeconomic History   Marital status: Married    Spouse name: Ross   Number of children: 0   Years of education: Not on file   Highest education level: Not on file  Occupational History   Occupation: Midwife payable    Employer: [employer]  Tobacco Use   Smoking status: Never    Passive exposure: Past   Smokeless tobacco: Never  Vaping Use   Vaping Use: Never used  Substance and Sexual Activity   Alcohol use: Not Currently   Drug use: No   Sexual activity: Not on file  Other Topics Concern   Not on file  Social History Narrative   Lives with boyfriend in a one story home.  Has no children.     Works in Marine scientist at [employer].     Education: college.   Right handed    Drinks caffeine   Social Determinants of Health   Financial Resource Strain: Not on file  Food Insecurity: Not on file  Transportation Needs: Not on file  Physical Activity: Not on file  Stress: Not on file  Social Connections: Not on file   Family History  Problem Relation Age of Onset   Hyperlipidemia Mother    Hypertension Mother    Diabetes Mother    Stroke Mother        Deceased   Heart disease Mother    Osteoarthritis Mother    Atrial fibrillation Father        Living   Stroke Father        optic nerve left eye   Healthy Brother    Heart failure Maternal Grandmother    Heart attack Maternal Grandfather    Dementia Paternal Grandmother    Dementia Paternal Grandfather    Diabetes Maternal Aunt    Allergies  Allergen Reactions   Moxifloxacin Anaphylaxis    needs epinephrine shot   Quinolones Anaphylaxis, Shortness Of Breath and Swelling   Sulfonamide Derivatives Hives, Swelling and Rash    needs epinephrine shot--rash and lip swelling   Doxycycline Nausea And Vomiting   Escitalopram Oxalate Other (See Comments)      fatigue   Lisinopril Cough   Prior to Admission medications   Medication Sig Start Date End Date Taking? Authorizing Provider  ALPRAZolam Prudy Feeler) 1 MG tablet Take 1 tablet (1 mg total) by mouth 2 (two) times daily as needed for anxiety. 07/25/22  Yes Corwin Levins, MD  amphetamine-dextroamphetamine (ADDERALL XR) 20 MG 24 hr capsule Take 1 capsule (20 mg total) by mouth daily. 08/04/22  Yes Corwin Levins, MD  buPROPion (WELLBUTRIN XL) 150 MG 24 hr tablet Take 1 tablet (150 mg total) by mouth daily. 12/17/21  Yes Corwin Levins, MD  cyclobenzaprine (FLEXERIL) 5 MG tablet Take 1 tablet (5 mg total) by mouth 2 (two) times daily as needed for muscle spasms. 07/27/22  Yes Patel, Donika K, DO  estradiol (ESTRACE) 1 MG tablet Take 1 tablet (1 mg total) by mouth daily. 12/17/21  Yes Corwin Levins, MD  fluticasone-salmeterol (ADVAIR DISKUS) 250-50 MCG/ACT AEPB Inhale 1 puff into the lungs in the morning and at bedtime. 07/21/22  Yes Corwin Levins, MD  folic acid (FOLVITE) 1 MG tablet Take 2 tablets (2 mg total) by mouth daily. 01/26/22  Yes Gearldine Bienenstock, PA-C  gabapentin (NEURONTIN) 600 MG tablet Take 1.5 tablets (900 mg total) by mouth 2 (two) times daily. OK to take extra dose as needed 07/27/22 07/27/23 Yes Patel, Donika K, DO  levothyroxine (SYNTHROID) 100 MCG tablet Take 1 tablet (100 mcg total) by mouth daily. 12/17/21  Yes Corwin Levins, MD  losartan (COZAAR) 25 MG tablet Take 1 tablet (25 mg total) by mouth daily. 01/18/22  Yes Corwin Levins, MD  methotrexate 50 MG/2ML injection Inject 0.8 mLs (20 mg total) into the skin once a week. 08/15/22  Yes Gearldine Bienenstock, PA-C  omeprazole (PRILOSEC) 40 MG capsule Take 1 capsule (40 mg total) by mouth daily. 05/11/22  Yes Lynann Bologna, MD  sertraline (ZOLOFT) 100 MG tablet Take 2 tablets (200 mg total) by mouth daily. 08/12/22  Yes Corwin Levins, MD  traZODone (DESYREL) 50 MG tablet Take 0.5-1 tablets (25-50 mg total) by mouth at bedtime as needed for sleep. 08/04/22  Yes  Corwin Levins, MD  Vitamin D, Ergocalciferol, (DRISDOL) 1.25 MG (50000 UNIT) CAPS capsule Take 1 capsule (50,000 Units total) by mouth every 7 (seven) days. 08/11/22  Yes Bowen, Scot Jun, DO  benzonatate (TESSALON) 100 MG capsule Take by mouth 3 (three) times daily as needed for cough.    [provider]  Blood Pressure Monitoring (OMRON 3 SERIES BP MONITOR) DEVI Use as directed. 12/09/20     clobetasol cream (TEMOVATE) 0.05 % Apply 1 Application topically 2 (two) times daily as needed. 01/26/22   Gearldine Bienenstock, PA-C  polyvinyl alcohol (ARTIFICIAL TEARS) 1.4 % ophthalmic solution Place 1 drop into both eyes as needed for dry eyes. 10/29/21   Corwin Levins, MD  Semaglutide-Weight Management (WEGOVY) 2.4 MG/0.75ML SOAJ Inject 2.4 mg into the skin once a week. 08/11/22   Bowen, Scot Jun, DO  Zoster Vaccine Adjuvanted [facility]) injection Inject into the muscle. 11/26/21   Judyann Munson, MD     Positive ROS: All other systems have been reviewed and were otherwise negative with the exception of those mentioned in the HPI and as above.  Physical Exam: General: Alert, no acute distress Cardiovascular: No pedal edema Respiratory: No cyanosis, no use of accessory musculature GI: abdomen soft Skin: No lesions in the area of chief complaint Neurologic: Sensation intact distally Psychiatric: Patient is competent for consent with normal mood and affect Lymphatic: no lymphedema  MUSCULOSKELETAL: exam stable  Assessment: left carpal tunnel syndrome, left thumb carpometacarpal osteoarthritis  Plan: Plan for Procedure(s): LEFT CARPAL TUNNEL RELEASE, LEFT THUMB CARPOMETACARPAL INJECTION  The risks benefits and alternatives were discussed with the patient including but not limited to the risks of nonoperative treatment, versus surgical intervention including infection, bleeding, nerve injury,  blood clots, cardiopulmonary complications, morbidity, mortality, among others, and they were willing to  proceed.   Glee Arvin, MD 09/07/2022 1:16 PM

## 2022-09-07 NOTE — Discharge Instructions (Addendum)
Postoperative instructions:  Weightbearing instructions: don't lift more than 10 lbs for 4 weeks  Dressing instructions: Keep your dressing and/or splint clean and dry at all times.  It will be removed at your first post-operative appointment.  Your stitches and/or staples will be removed at this visit.  Incision instructions:  Do not soak your incision for 3 weeks after surgery.  If the incision gets wet, pat dry and do not scrub the incision.  Pain control:  You have been given a prescription to be taken as directed for post-operative pain control.  In addition, elevate the operative extremity above the heart at all times to prevent swelling and throbbing pain.  Take over-the-counter Colace, 100mg  by mouth twice a day while taking narcotic pain medications to help prevent constipation.  Follow up appointments: 1) 10 days for suture removal and wound check. 2) Dr. Roda Shutters as scheduled.   -------------------------------------------------------------------------------------------------------------  After Surgery Pain Control:  After your surgery, post-surgical discomfort or pain is likely. This discomfort can last several days to a few weeks. At certain times of the day your discomfort may be more intense.  Did you receive a nerve block?  A nerve block can provide pain relief for one hour to two days after your surgery. As long as the nerve block is working, you will experience little or no sensation in the area the surgeon operated on.  As the nerve block wears off, you will begin to experience pain or discomfort. It is very important that you begin taking your prescribed pain medication before the nerve block fully wears off. Treating your pain at the first sign of the block wearing off will ensure your pain is better controlled and more tolerable when full-sensation returns. Do not wait until the pain is intolerable, as the medicine will be less effective. It is better to treat pain in  advance than to try and catch up.  General Anesthesia:  If you did not receive a nerve block during your surgery, you will need to start taking your pain medication shortly after your surgery and should continue to do so as prescribed by your surgeon.  Pain Medication:  Most commonly we prescribe Vicodin and Percocet for post-operative pain. Both of these medications contain a combination of acetaminophen (Tylenol) and a narcotic to help control pain.   It takes between 30 and 45 minutes before pain medication starts to work. It is important to take your medication before your pain level gets too intense.   Nausea is a common side effect of many pain medications. You will want to eat something before taking your pain medicine to help prevent nausea.   If you are taking a prescription pain medication that contains acetaminophen, we recommend that you do not take additional over the counter acetaminophen (Tylenol).  Other pain relieving options:   Using a cold pack to ice the affected area a few times a day (15 to 20 minutes at a time) can help to relieve pain, reduce swelling and bruising.   Elevation of the affected area can also help to reduce pain and swelling.     Post Anesthesia Home Care Instructions  Activity: Get plenty of rest for the remainder of the day. A responsible individual must stay with you for 24 hours following the procedure.  For the next 24 hours, DO NOT: -Drive a car -Advertising copywriter -Drink alcoholic beverages -Take any medication unless instructed by your physician -Make any legal decisions or sign important papers.  Meals:  Start with liquid foods such as gelatin or soup. Progress to regular foods as tolerated. Avoid greasy, spicy, heavy foods. If nausea and/or vomiting occur, drink only clear liquids until the nausea and/or vomiting subsides. Call your physician if vomiting continues.  Special Instructions/Symptoms: Your throat may feel dry or sore from  the anesthesia or the breathing tube placed in your throat during surgery. If this causes discomfort, gargle with warm salt water. The discomfort should disappear within 24 hours.  If you had a scopolamine patch placed behind your ear for the management of post- operative nausea and/or vomiting:  1. The medication in the patch is effective for 72 hours, after which it should be removed.  Wrap patch in a tissue and discard in the trash. Wash hands thoroughly with soap and water. 2. You may remove the patch earlier than 72 hours if you experience unpleasant side effects which may include dry mouth, dizziness or visual disturbances. 3. Avoid touching the patch. Wash your hands with soap and water after contact with the patch.

## 2022-09-07 NOTE — Anesthesia Postprocedure Evaluation (Signed)
Anesthesia Post Note  Patient: Tresea Heine  Procedure(s) Performed: LEFT CARPAL TUNNEL RELEASE, LEFT THUMB CARPOMETACARPAL INJECTION (Left: Hand)     Patient location during evaluation: PACU Anesthesia Type: MAC Level of consciousness: awake and alert Pain management: pain level controlled Vital Signs Assessment: post-procedure vital signs reviewed and stable Respiratory status: spontaneous breathing, nonlabored ventilation, respiratory function stable and patient connected to nasal cannula oxygen Cardiovascular status: stable and blood pressure returned to baseline Postop Assessment: no apparent nausea or vomiting Anesthetic complications: no  No notable events documented.  Last Vitals:  Vitals:   09/07/22 1430 09/07/22 1500  BP: 122/76 115/73  Pulse: 76 80  Resp: 14   Temp:  36.7 C  SpO2: 95% 98%    Last Pain:  Vitals:   09/07/22 1500  TempSrc: Temporal  PainSc: 0-No pain                 Trevor Iha

## 2022-09-08 ENCOUNTER — Encounter: Payer: Commercial Managed Care - PPO | Admitting: Neurology

## 2022-09-08 ENCOUNTER — Encounter (HOSPITAL_BASED_OUTPATIENT_CLINIC_OR_DEPARTMENT_OTHER): Payer: Self-pay | Admitting: Orthopaedic Surgery

## 2022-09-12 ENCOUNTER — Other Ambulatory Visit: Payer: Self-pay

## 2022-09-12 ENCOUNTER — Encounter: Payer: Self-pay | Admitting: Internal Medicine

## 2022-09-12 ENCOUNTER — Ambulatory Visit (INDEPENDENT_AMBULATORY_CARE_PROVIDER_SITE_OTHER): Payer: Commercial Managed Care - PPO | Admitting: Family Medicine

## 2022-09-12 ENCOUNTER — Other Ambulatory Visit (HOSPITAL_BASED_OUTPATIENT_CLINIC_OR_DEPARTMENT_OTHER): Payer: Self-pay

## 2022-09-12 ENCOUNTER — Encounter (INDEPENDENT_AMBULATORY_CARE_PROVIDER_SITE_OTHER): Payer: Self-pay | Admitting: Family Medicine

## 2022-09-12 VITALS — BP 126/83 | HR 71 | Temp 97.7°F | Ht 67.0 in | Wt 285.0 lb

## 2022-09-12 DIAGNOSIS — Z6841 Body Mass Index (BMI) 40.0 and over, adult: Secondary | ICD-10-CM

## 2022-09-12 DIAGNOSIS — E559 Vitamin D deficiency, unspecified: Secondary | ICD-10-CM

## 2022-09-12 DIAGNOSIS — F4323 Adjustment disorder with mixed anxiety and depressed mood: Secondary | ICD-10-CM | POA: Diagnosis not present

## 2022-09-12 MED ORDER — VITAMIN D (ERGOCALCIFEROL) 1.25 MG (50000 UNIT) PO CAPS
50000.0000 [IU] | ORAL_CAPSULE | ORAL | 0 refills | Status: DC
Start: 2022-09-12 — End: 2022-11-01
  Filled 2022-09-12: qty 5, 35d supply, fill #0

## 2022-09-12 MED ORDER — AMPHETAMINE-DEXTROAMPHET ER 20 MG PO CP24
20.0000 mg | ORAL_CAPSULE | Freq: Every day | ORAL | 0 refills | Status: DC
  Filled 2022-09-12: qty 30, 30d supply, fill #0

## 2022-09-12 NOTE — Assessment & Plan Note (Signed)
Reviewed bioimpedence together.  She is doing well with maintaining muscle mass and losing body fat.  She has added in more consistent exercise and is motivated to continue.  She is doing well with her caloric deficit, adequate satiety on Wegovy but her insurance coverage has ended and her RX will run out in 4 weeks.  Finish out [employer] 2.4 mg -- can move injection to q 10 days Continue to focus on hitting 100 g of protein intake daily goal, eating on a schedule and meal planning. Track daily steps with a smart watch

## 2022-09-12 NOTE — Assessment & Plan Note (Signed)
Doing well with Wellbutrin XL 150 mg daily and CBT.  She is off work right now for recent L carpal tunnel surgery and stress levels have really reduced.  Her mood seems to be work related and she looks forward to going back to school and finding an alternative job to improve her happiness.  She has a good support system.  Continue current plans of care, focusing on proper sleep at night, good nutrition and regular exercise.

## 2022-09-12 NOTE — Progress Notes (Signed)
Office: [phone]  /  Fax: [phone]  WEIGHT SUMMARY AND BIOMETRICS  Starting Date: 11/16/21  Starting Weight: 333lb   Weight Lost Since Last Visit: 6lb   Vitals Temp: 97.7 F (36.5 C) BP: 126/83 Pulse Rate: 71 SpO2: 98 %   Body Composition  Body Fat %: 48.1 % Fat Mass (lbs): 137.2 lbs Muscle Mass (lbs): 140.8 lbs Total Body Water (lbs): 109 lbs Visceral Fat Rating : 16     HPI  Chief Complaint: OBESITY  [name] is here to discuss her progress with her obesity treatment plan. She is on the practicing portion control and making smarter food choices, such as increasing vegetables and decreasing simple carbohydrates and states she is following her eating plan approximately 80 % of the time. She states she is exercising 0 minutes 0 times per week.   Interval History:  Since last office visit she is down 6 lb since last visit She has a net weight loss of 48 lb in the past 10 mos She had to hold her [facility] for L carpal tunnel surgery She plans to restart it on Friday which will be a 2 week gap She is finishing out her Wegovy 2.4 mg dose for 3 more pens She started CBT with Dr Dewaine Conger and will start seeing an outside therapist She plans to go back to school and is looking at alternative job She has been hiking more often and feels good about her progress She is maintaining her muscle mass  Pharmacotherapy: Wegovy 2.4 mg (insurance coverage ending)  PHYSICAL EXAM:  Blood pressure 126/83, pulse 71, temperature 97.7 F (36.5 C), height 5\' 7"  (1.702 m), weight 285 lb (129.3 kg), last menstrual period 11/02/2014, SpO2 98 %. Body mass index is 44.64 kg/m.  General: She is overweight, cooperative, alert, well developed, and in no acute distress. PSYCH: Has normal mood, affect and thought process.   Lungs: Normal breathing effort, no conversational dyspnea.   ASSESSMENT AND PLAN  TREATMENT PLAN FOR OBESITY:  Recommended Dietary Goals  [name] is currently in  the action stage of change. As such, her goal is to continue weight management plan. She has agreed to practicing portion control and making smarter food choices, such as increasing vegetables and decreasing simple carbohydrates.  Behavioral Intervention  We discussed the following Behavioral Modification Strategies today: increasing lean protein intake, decreasing simple carbohydrates , increasing vegetables, increasing lower glycemic fruits, increasing water intake, work on managing stress, creating time for self-care and relaxation measures, avoiding temptations and identifying enticing environmental cues, continue to practice mindfulness when eating, and planning for success.  Additional resources provided today: NA  Recommended Physical Activity Goals  [name] has been advised to work up to 150 minutes of moderate intensity aerobic activity a week and strengthening exercises 2-3 times per week for cardiovascular health, weight loss maintenance and preservation of muscle mass.   She has agreed to Work on scheduling and tracking physical activity.   Pharmacotherapy changes for the treatment of obesity: Wegovy 2.4 mg   ASSOCIATED CONDITIONS ADDRESSED TODAY  Adjustment Disorder with Mixed Anxiety and Depressed Mood Assessment & Plan: Doing well with Wellbutrin XL 150 mg daily and CBT.  She is off work right now for recent L carpal tunnel surgery and stress levels have really reduced.  Her mood seems to be work related and she looks forward to going back to school and finding an alternative job to improve her happiness.  She has a good support system.  Continue current plans  of care, focusing on proper sleep at night, good nutrition and regular exercise.   Vitamin D deficiency Assessment & Plan: Last vitamin D Lab Results  Component Value Date   VD25OH 21 06/17/2022   Doing well on RX vitamin D 50,000 IU weekly without adverse SE.  Energy level has improved. Recheck Vitamin D level  next visit.   Orders: -     Vitamin D (Ergocalciferol); Take 1 capsule (50,000 Units total) by mouth every 7 (seven) days.  Dispense: 5 capsule; Refill: 0  Morbid obesity: Starting BMI 52.1 Assessment & Plan: Reviewed bioimpedence together.  She is doing well with maintaining muscle mass and losing body fat.  She has added in more consistent exercise and is motivated to continue.  She is doing well with her caloric deficit, adequate satiety on Wegovy but her insurance coverage has ended and her RX will run out in 4 weeks.  Finish out [employer] 2.4 mg -- can move injection to q 10 days Continue to focus on hitting 100 g of protein intake daily goal, eating on a schedule and meal planning. Track daily steps with a smart watch   BMI 40.0-44.9, adult [facility])      She was informed of the importance of frequent follow up visits to maximize her success with intensive lifestyle modifications for her multiple health conditions.   ATTESTASTION STATEMENTS:  Reviewed by clinician on day of visit: allergies, medications, problem list, medical history, surgical history, family history, social history, and previous encounter notes pertinent to obesity diagnosis.   I have personally spent 30 minutes total time today in preparation, patient care, nutritional counseling and documentation for this visit, including the following: review of clinical lab tests; review of medical tests/procedures/services.      [name], DO DABFM, DABOM [facility] [address] [place], [place] [address] [phone]

## 2022-09-12 NOTE — Assessment & Plan Note (Signed)
Last vitamin D Lab Results  Component Value Date   VD25OH 50 06/17/2022   Doing well on RX vitamin D 50,000 IU weekly without adverse SE.  Energy level has improved. Recheck Vitamin D level next visit.

## 2022-09-14 ENCOUNTER — Telehealth: Payer: Self-pay | Admitting: Orthopaedic Surgery

## 2022-09-14 NOTE — Telephone Encounter (Signed)
[employer] forms received. To [employer]. 

## 2022-09-16 ENCOUNTER — Encounter: Payer: Commercial Managed Care - PPO | Admitting: Neurology

## 2022-09-16 ENCOUNTER — Other Ambulatory Visit (HOSPITAL_BASED_OUTPATIENT_CLINIC_OR_DEPARTMENT_OTHER): Payer: Self-pay

## 2022-09-16 ENCOUNTER — Ambulatory Visit (INDEPENDENT_AMBULATORY_CARE_PROVIDER_SITE_OTHER): Payer: Commercial Managed Care - PPO | Admitting: Physician Assistant

## 2022-09-16 ENCOUNTER — Encounter: Payer: Self-pay | Admitting: Physician Assistant

## 2022-09-16 DIAGNOSIS — G5602 Carpal tunnel syndrome, left upper limb: Secondary | ICD-10-CM

## 2022-09-16 MED ORDER — HYDROCODONE-ACETAMINOPHEN 5-325 MG PO TABS
1.0000 | ORAL_TABLET | Freq: Two times a day (BID) | ORAL | 0 refills | Status: DC | PRN
  Filled 2022-09-16: qty 20, 10d supply, fill #0

## 2022-09-16 NOTE — Progress Notes (Signed)
Post-Op Visit Note   Patient: [name]           Date of Birth: 05-10-1970           MRN: [id number] Visit Date: 09/16/2022 PCP: Corwin Levins, MD   Assessment & Plan:  Chief Complaint:  Chief Complaint  Patient presents with   Left Wrist - Follow-up    Left carpal tunnel release and thumb injection 09/07/2022   Visit Diagnoses:  1. Left carpal tunnel syndrome     Plan: Patient is a pleasant 52 year old female who comes in today 9 days status post left carpal tunnel release 09/07/2022.  She has been doing well.  She notes slight discomfort at times for which she is taking ibuprofen and Tylenol during the day and Norco at night.  She denies any paresthesias to the left hand.  Examination left hand reveals a well-healing surgical incision with nylon sutures in place.  No evidence of infection or cellulitis.  Fingers are warm well-perfused.  She is neurovascular intact distally.  Today, her wound was cleaned and recovered.  She was placed in a removable splint for which she will wear at all times for the next week.  She may begin nerve gliding exercises.  No heavy lifting or submerging her hand underwater until she is 4 weeks postop.  Follow-up next week for suture removal.  Call with concerns or questions.  Follow-Up Instructions: Return in about 1 week (around 09/23/2022).   Orders:  No orders of the defined types were placed in this encounter.  Meds ordered this encounter  Medications   HYDROcodone-acetaminophen (NORCO) 5-325 MG tablet    Sig: Take 1 tablet by mouth 2 (two) times daily as needed.    Dispense:  20 tablet    Refill:  0    Imaging: No new imaging  PMFS History: Patient Active Problem List   Diagnosis Date Noted   Adjustment disorder with mixed anxiety and depressed mood 09/12/2022   Carpal tunnel syndrome on left 09/07/2022   Eating disorder 08/11/2022   Blurry vision, bilateral 06/28/2022   Primary osteoarthritis of first carpometacarpal joint of left  hand 06/28/2022   Hoarseness 06/18/2022   Insulin resistance 05/18/2022   Major depressive disorder 03/09/2022   Polyphagia 03/09/2022   Left foot pain 02/16/2022   Osteoarthritis of both knees 01/13/2022   Hot flashes due to menopause 12/19/2021   ADD (attention deficit disorder) 12/19/2021   SOBOE (shortness of breath on exertion) 11/16/2021   Elevated glucose 11/16/2021   Vitamin D deficiency 11/16/2021   Prediabetes 11/16/2021   Bunion, left 08/06/2021   Morbid obesity (HCC) with initial BMI 52 06/26/2021   Venous insufficiency 06/25/2021   Incisional hernia 12/25/2020   Mediastinal lymphadenopathy    Pulmonary nodules 10/20/2020   Multiple skin nodules 07/24/2020   Right lumbar radiculopathy 05/18/2020   Periumbilical hernia 05/18/2020   TB lung, latent 05/18/2020   Acute non-recurrent maxillary sinusitis 11/09/2019   HTN (hypertension) 04/14/2019   Mass of left wrist 04/14/2019   External otitis of right ear 04/14/2019   Choledocholithiasis    Abnormal cholangiogram    Cholecystitis with cholelithiasis 07/24/2018   Hot flash not due to menopause 10/16/2017   Eczema 10/16/2017   Flu-like symptoms 06/20/2017   Cognitive changes 01/30/2017   Disturbed concentration 01/26/2017   Left shoulder pain 11/01/2016   Right low back pain 11/01/2016   Elevated blood pressure, situational 05/05/2016   Surgical menopause 01/29/2016   OSA on CPAP 12/22/2015  Cough 11/26/2015   Capsulitis 10/06/2015   Ganglion cyst of left foot 10/06/2015   Fatigue 09/13/2015   Asthma 09/08/2015   Exertional dyspnea 09/08/2015   Peroneal ganglion cyst 07/20/2015   Low back pain 07/20/2015   Peroneal tendinitis of left lower leg 06/25/2015   Nonallopathic lesion of cervical region 06/25/2015   Wheezing 05/08/2015   Polyarthralgia 05/08/2015   Peripheral edema 03/16/2015   Central line complication 03/16/2015   Chemotherapy induced neutropenia (HCC) 02/28/2015   Screening for colorectal  cancer 01/05/2015   Chemotherapy induced nausea and vomiting 01/02/2015   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   Premature surgical menopause 12/17/2014   Leukopenia due to antineoplastic chemotherapy (HCC) 12/17/2014   Port-A-Cath in place 12/17/2014   Encounter for antineoplastic chemotherapy 12/17/2014   Myalgia 12/09/2014   Hypersensitivity reaction 12/05/2014   Poor venous access 11/28/2014   Postoperative cellulitis of surgical wound 11/24/2014   Malignant neoplasm of both ovaries 11/10/2014   Anxiety 11/10/2014   Adnexal mass 11/10/2014   Encounter for well adult exam with abnormal findings 09/10/2014   Hypothyroidism 09/10/2014   Hypersomnolence 09/10/2014   Acute non-recurrent frontal sinusitis 06/24/2014   Right knee pain 06/24/2014   Lower back pain 01/08/2014   EAR PAIN, BILATERAL 12/18/2009   KNEE PAIN, RIGHT 12/18/2009   INGROWN TOENAIL 12/17/2008   BACK PAIN 12/17/2008   Acute bronchospasm 11/11/2008   Depression 11/29/2007   Insomnia 11/29/2007   Allergic rhinitis 04/13/2007   GERD 04/13/2007   Endometriosis determined by laparoscopy 04/13/2007   Migraine headache 01/29/2007   Past Medical History:  Diagnosis Date   Anxiety    Arthritis    Asthma    Back pain    Barrett's esophagus    Chemotherapy-induced neuropathy (HCC)    Chronic lower back pain    Depression    Dyspnea    Elevated blood pressure, situational 05/05/2016   Endometriosis    Exercise-induced asthma 09/08/2015   Fatigue    Gallbladder problem    GERD (gastroesophageal reflux disease)    Heartburn    History of kidney stones    Hypertension    Hypothyroidism    Infertility, female    Joint pain    Joint stiffness    Lower extremity edema    Migraine    ovarian cancer    Ovarian cancer, bilateral (HCC) 11/28/2014   PONV (postoperative nausea and vomiting)    Sarcoidosis    Sleep apnea    SOBOE (shortness of breath on exertion)    Thyroid disease     Umbilical hernia    Vitamin D deficiency    woke up while porta cath removed     Family History  Problem Relation Age of Onset   Hyperlipidemia Mother    Hypertension Mother    Diabetes Mother    Stroke Mother        Deceased   Heart disease Mother    Osteoarthritis Mother    Atrial fibrillation Father        Living   Stroke Father        optic nerve left eye   Healthy Brother    Heart failure Maternal Grandmother    Heart attack Maternal Grandfather    Dementia Paternal Grandmother    Dementia Paternal Grandfather    Diabetes Maternal Aunt     Past Surgical History:  Procedure Laterality Date   ABDOMINAL HYSTERECTOMY  11/10/2014   at [facility], Exp lap, supracervical hyst, BSO, infracolic omentectomy, lymphadenectomy,  aortic lymph node sampling   BIOPSY  11/04/2021   Procedure: BIOPSY;  Surgeon: Lynann Bologna, MD;  Location: [facility];  Service: Gastroenterology;;   BRONCHIAL NEEDLE ASPIRATION BIOPSY  12/16/2020   Procedure: BRONCHIAL NEEDLE ASPIRATION BIOPSIES;  Surgeon: Oretha Milch, MD;  Location: [facility] ENDOSCOPY;  Service: Cardiopulmonary;;   BRONCHIAL WASHINGS  12/16/2020   Procedure: BRONCHIAL WASHINGS;  Surgeon: Oretha Milch, MD;  Location: [facility] ENDOSCOPY;  Service: Cardiopulmonary;;   BUNIONECTOMY Left 09/14/2021   CARPAL TUNNEL RELEASE Left 09/07/2022   Procedure: LEFT CARPAL TUNNEL RELEASE, LEFT THUMB CARPOMETACARPAL INJECTION;  Surgeon: Tarry Kos, MD;  Location: [facility];  Service: Orthopedics;  Laterality: Left;   CHOLECYSTECTOMY N/A 07/24/2018   Procedure: LAPAROSCOPIC CHOLECYSTECTOMY WITH INTRAOPERATIVE CHOLANGIOGRAM;  Surgeon: Darnell Level, MD;  Location: [facility];  Service: General;  Laterality: N/A;   COLONOSCOPY WITH PROPOFOL N/A 11/04/2021   Procedure: COLONOSCOPY WITH PROPOFOL;  Surgeon: Lynann Bologna, MD;  Location: [facility];  Service: Gastroenterology;  Laterality: N/A;   ERCP N/A 07/26/2018   Procedure: ENDOSCOPIC RETROGRADE  CHOLANGIOPANCREATOGRAPHY (ERCP);  Surgeon: Meryl Dare, MD;  Location: [facility];  Service: Endoscopy;  Laterality: N/A;   ESOPHAGOGASTRODUODENOSCOPY (EGD) WITH PROPOFOL N/A 11/04/2021   Procedure: ESOPHAGOGASTRODUODENOSCOPY (EGD) WITH PROPOFOL;  Surgeon: Lynann Bologna, MD;  Location: [facility];  Service: Gastroenterology;  Laterality: N/A;   GANGLION CYST EXCISION Right    hand   HYSTERECTOMY ABDOMINAL WITH SALPINGO-OOPHORECTOMY  11/10/2014   ovarian cancer, tumor removal   INCISIONAL HERNIA REPAIR N/A 12/25/2020   Procedure: LAPAROSCOPIC INCISIONAL HERNIA REPAIR WITH MESH;  Surgeon: Kinsinger, De Blanch, MD;  Location: [facility];  Service: General;  Laterality: N/A;   ingrown nail removal     IR GENERIC HISTORICAL  05/12/2016   IR REMOVAL TUN ACCESS W/ PORT W/O FL MOD SED 05/12/2016 [facility]   LAPAROSCOPIC ENDOMETRIOSIS FULGURATION     LIPOMA EXCISION Left    ankle   meniscal tear Right    MENISCUS REPAIR Right    oral sugery     POLYPECTOMY  11/04/2021   Procedure: POLYPECTOMY;  Surgeon: Lynann Bologna, MD;  Location: [facility];  Service: Gastroenterology;;   Shelda Pal a cath removal     PORTA CATH INSERTION     REMOVAL OF STONES  07/26/2018   Procedure: REMOVAL OF STONES;  Surgeon: Meryl Dare, MD;  Location: [facility];  Service: Endoscopy;;   SPHINCTEROTOMY  07/26/2018   Procedure: Dennison Mascot;  Surgeon: Meryl Dare, MD;  Location: [facility];  Service: Endoscopy;;   UMBILICAL HERNIA REPAIR  12/29/2020   VIDEO BRONCHOSCOPY WITH ENDOBRONCHIAL ULTRASOUND N/A 12/16/2020   Procedure: VIDEO BRONCHOSCOPY WITH ENDOBRONCHIAL ULTRASOUND;  Surgeon: Oretha Milch, MD;  Location: [facility] ENDOSCOPY;  Service: Cardiopulmonary;  Laterality: N/A;   Social History   Occupational History   Occupation: Educational psychologist: [employer]  Tobacco Use   Smoking status: Never    Passive exposure: Past   Smokeless tobacco: Never  Vaping Use   Vaping Use: Never  used  Substance and Sexual Activity   Alcohol use: Not Currently   Drug use: No   Sexual activity: Not on file

## 2022-09-19 ENCOUNTER — Encounter: Payer: Self-pay | Admitting: Oncology

## 2022-09-19 ENCOUNTER — Telehealth (INDEPENDENT_AMBULATORY_CARE_PROVIDER_SITE_OTHER): Payer: Commercial Managed Care - PPO | Admitting: Psychology

## 2022-09-19 DIAGNOSIS — F4323 Adjustment disorder with mixed anxiety and depressed mood: Secondary | ICD-10-CM

## 2022-09-19 DIAGNOSIS — F5089 Other specified eating disorder: Secondary | ICD-10-CM

## 2022-09-19 NOTE — Progress Notes (Signed)
  Office: [phone]  /  Fax: [phone]    Date: Sep 19, 2022    Appointment Start Time: 8:07am Duration: 26 minutes Provider: Lawerance Cruel, Psy.D. Type of Session: Individual Therapy  Location of Patient: Home (private location) Location of Provider: Provider's Home (private office) Type of Contact: Telepsychological Visit via MyChart Video Visit  Session Content: This provider called Marylene Land at 8:02am as she did not present for today's appointment. She thought the appointment was later in the day, noting she could join. As such, today's appointment was initiated 7 minutes late. Eunita is a 52 y.o. female presenting for a follow-up appointment to address the previously established treatment goal of increasing coping skills.Today's appointment was a telepsychological visit. Jakevia provided verbal consent for today's telepsychological appointment and she is aware she is responsible for securing confidentiality on her end of the session. Prior to proceeding with today's appointment, Amna's physical location at the time of this appointment was obtained as well a phone number she could be reached at in the event of technical difficulties. Dekyra and this provider participated in today's telepsychological service.   This provider conducted a brief check-in. Jatasha stated she is "doing well" and "still on medical leave." She indicated feeling a "bit depressed" due to family conflict. Reviewed emotional and physical hunger. Psychoeducation regarding triggers for emotional eating was provided. Latonja was provided a handout, and encouraged to utilize the handout between now and the next appointment to increase awareness of triggers and frequency. Marylene Land agreed. This provider also discussed behavioral strategies for specific triggers, such as placing the utensil down when conversing to avoid mindless eating. Mika provided verbal consent during today's appointment for this provider to send a handout  about triggers via e-mail. Overall, Romayne was receptive to today's appointment as evidenced by openness to sharing, responsiveness to feedback, and willingness to explore triggers for emotional eating.  Mental Status Examination:  Appearance: neat Behavior: appropriate to circumstances Mood: sad Affect: mood congruent Speech: WNL Eye Contact: intermittent Psychomotor Activity: WNL Gait: unable to assess Thought Process: linear, logical, and goal directed and no evidence or endorsement of suicidal, homicidal, and self-harm ideation, plan and intent  Thought Content/Perception: no hallucinations, delusions, bizarre thinking or behavior endorsed or observed Orientation: AAOx4 Memory/Concentration: intact Insight: fair Judgment: fair  Interventions:  Conducted a brief chart review Provided empathic reflections and validation Reviewed content from the previous session Employed supportive psychotherapy interventions to facilitate reduced distress and to improve coping skills with identified stressors Psychoeducation provided regarding triggers for emotional eating behaviors  DSM-5 Diagnosis(es):  F50.89 Other Specified Feeding or Eating Disorder, Emotional Eating Behaviors and  F43.23 Adjustment Disorder with Mixed Anxiety and Depressed Mood  Treatment Goal & Progress: During the initial appointment with this provider, the following treatment goal was established: increase coping skills. Progress is limited, as Yamna has just begun treatment with this provider; however, she is receptive to the interaction and interventions and rapport is being established.   Plan: The next appointment is scheduled for 10/04/2022 at 12:30pm, which will be via MyChart Video Visit. The next session will focus on working towards the established treatment goal. Marylene Land agreed to call [facility] back to establish care.

## 2022-09-23 ENCOUNTER — Encounter: Payer: Self-pay | Admitting: Orthopaedic Surgery

## 2022-09-23 ENCOUNTER — Ambulatory Visit (INDEPENDENT_AMBULATORY_CARE_PROVIDER_SITE_OTHER): Payer: Commercial Managed Care - PPO | Admitting: Physician Assistant

## 2022-09-23 DIAGNOSIS — G5602 Carpal tunnel syndrome, left upper limb: Secondary | ICD-10-CM

## 2022-09-23 DIAGNOSIS — G5601 Carpal tunnel syndrome, right upper limb: Secondary | ICD-10-CM

## 2022-09-23 NOTE — Progress Notes (Unsigned)
Office Visit Note   Patient: [name]           Date of Birth: 12-25-1970           MRN: [id number] Visit Date: 09/23/2022              Requested by: Corwin Levins, MD [address] [place],  [place] [address] PCP: Corwin Levins, MD   Assessment & Plan: Visit Diagnoses:  1. Carpal tunnel syndrome, right upper limb   2. Carpal tunnel syndrome on left     Plan: Impression is status post left carpal tunnel release and left first [facility] joint injection doing well.  Today, sutures were removed and Steri-Strips applied.  No heavy lifting or submerging her hand in water for 2 weeks.  Follow-up in 4 weeks for recheck.  In regards to the right hand, she has symptoms consistent with right thumb first [facility] joint osteoarthritis in addition to right carpal tunnel syndrome.  She would like to proceed with right carpal tunnel release and right thumb CMC joint cortisone injection while she is sedated for her carpal tunnel release.  Risk, benefits and possible complications reviewed.  Rehab recovery time discussed.  All questions were answered.  [name] will call her to schedule the surgery.  Follow-Up Instructions: Return in about 2 weeks (around 10/07/2022).   Orders:  No orders of the defined types were placed in this encounter.  No orders of the defined types were placed in this encounter.     Procedures: No procedures performed   Clinical Data: No additional findings.   Subjective: Chief Complaint  Patient presents with   Left Wrist - Follow-up    Left carpal tunnel release and thumb injection 09/07/2022    HPI patient is a pleasant 52 year old female who comes in today approximately 2 weeks status post left carpal tunnel release and left thumb CMC injection 09/07/2022.  She has been doing great in regards to both.  She notes mild discomfort as well as a pulling sensation to the volar forearm but nothing more.  No paresthesias.  Her main issue remains the right hand and right thumb.   History of right carpal tunnel syndrome with EMGs dated from 2017.  She continues to endorse paresthesias to the right hand which frequently wake her up from her sleep.  She is also complaining of pain to the right thumb.  Both problems are very similar to the left hand.  Review of Systems as detailed in HPI.  All others reviewed and are negative.   Objective: Vital Signs: LMP 11/02/2014   Physical Exam well-developed well-nourished female no acute distress.  Alert and oriented x 3.  Ortho Exam left hand exam reveals a fully healed surgical scar without complication.  Nylon sutures in place.  Fingers are warm well-perfused.  Right hand exam: Has pain but no crepitus with grind test.  She does have pain to the base of the first [facility] joint.  Positive Phalen and positive Tinel at the wrist.  She is neurovascular intact distally.  Specialty Comments:  No specialty comments available.  Imaging: No new imaging   PMFS History: Patient Active Problem List   Diagnosis Date Noted   Adjustment disorder with mixed anxiety and depressed mood 09/12/2022   Carpal tunnel syndrome on left 09/07/2022   Eating disorder 08/11/2022   Blurry vision, bilateral 06/28/2022   Primary osteoarthritis of first carpometacarpal joint of left hand 06/28/2022   Hoarseness 06/18/2022   Insulin resistance 05/18/2022  Major depressive disorder 03/09/2022   Polyphagia 03/09/2022   Left foot pain 02/16/2022   Osteoarthritis of both knees 01/13/2022   Hot flashes due to menopause 12/19/2021   ADD (attention deficit disorder) 12/19/2021   SOBOE (shortness of breath on exertion) 11/16/2021   Elevated glucose 11/16/2021   Vitamin D deficiency 11/16/2021   Prediabetes 11/16/2021   Bunion, left 08/06/2021   Morbid obesity (HCC) with initial BMI 52 06/26/2021   Venous insufficiency 06/25/2021   Incisional hernia 12/25/2020   Mediastinal lymphadenopathy    Pulmonary nodules 10/20/2020   Multiple skin nodules  07/24/2020   Right lumbar radiculopathy 05/18/2020   Periumbilical hernia 05/18/2020   TB lung, latent 05/18/2020   Acute non-recurrent maxillary sinusitis 11/09/2019   HTN (hypertension) 04/14/2019   Mass of left wrist 04/14/2019   External otitis of right ear 04/14/2019   Choledocholithiasis    Abnormal cholangiogram    Cholecystitis with cholelithiasis 07/24/2018   Hot flash not due to menopause 10/16/2017   Eczema 10/16/2017   Flu-like symptoms 06/20/2017   Cognitive changes 01/30/2017   Disturbed concentration 01/26/2017   Left shoulder pain 11/01/2016   Right low back pain 11/01/2016   Elevated blood pressure, situational 05/05/2016   Surgical menopause 01/29/2016   OSA on CPAP 12/22/2015   Cough 11/26/2015   Capsulitis 10/06/2015   Ganglion cyst of left foot 10/06/2015   Fatigue 09/13/2015   Asthma 09/08/2015   Exertional dyspnea 09/08/2015   Peroneal ganglion cyst 07/20/2015   Low back pain 07/20/2015   Peroneal tendinitis of left lower leg 06/25/2015   Nonallopathic lesion of cervical region 06/25/2015   Wheezing 05/08/2015   Polyarthralgia 05/08/2015   Peripheral edema 03/16/2015   Central line complication 03/16/2015   Chemotherapy induced neutropenia (HCC) 02/28/2015   Screening for colorectal cancer 01/05/2015   Chemotherapy induced nausea and vomiting 01/02/2015   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   Premature surgical menopause 12/17/2014   Leukopenia due to antineoplastic chemotherapy (HCC) 12/17/2014   Port-A-Cath in place 12/17/2014   Encounter for antineoplastic chemotherapy 12/17/2014   Myalgia 12/09/2014   Hypersensitivity reaction 12/05/2014   Poor venous access 11/28/2014   Postoperative cellulitis of surgical wound 11/24/2014   Malignant neoplasm of both ovaries 11/10/2014   Anxiety 11/10/2014   Adnexal mass 11/10/2014   Encounter for well adult exam with abnormal findings 09/10/2014   Hypothyroidism 09/10/2014    Hypersomnolence 09/10/2014   Acute non-recurrent frontal sinusitis 06/24/2014   Right knee pain 06/24/2014   Lower back pain 01/08/2014   EAR PAIN, BILATERAL 12/18/2009   KNEE PAIN, RIGHT 12/18/2009   INGROWN TOENAIL 12/17/2008   BACK PAIN 12/17/2008   Acute bronchospasm 11/11/2008   Depression 11/29/2007   Insomnia 11/29/2007   Allergic rhinitis 04/13/2007   GERD 04/13/2007   Endometriosis determined by laparoscopy 04/13/2007   Migraine headache 01/29/2007   Past Medical History:  Diagnosis Date   Anxiety    Arthritis    Asthma    Back pain    Barrett's esophagus    Chemotherapy-induced neuropathy (HCC)    Chronic lower back pain    Depression    Dyspnea    Elevated blood pressure, situational 05/05/2016   Endometriosis    Exercise-induced asthma 09/08/2015   Fatigue    Gallbladder problem    GERD (gastroesophageal reflux disease)    Heartburn    History of kidney stones    Hypertension    Hypothyroidism    Infertility, female    Joint pain  Joint stiffness    Lower extremity edema    Migraine    ovarian cancer    Ovarian cancer, bilateral (HCC) 11/28/2014   PONV (postoperative nausea and vomiting)    Sarcoidosis    Sleep apnea    SOBOE (shortness of breath on exertion)    Thyroid disease    Umbilical hernia    Vitamin D deficiency    woke up while porta cath removed     Family History  Problem Relation Age of Onset   Hyperlipidemia Mother    Hypertension Mother    Diabetes Mother    Stroke Mother        Deceased   Heart disease Mother    Osteoarthritis Mother    Atrial fibrillation Father        Living   Stroke Father        optic nerve left eye   Healthy Brother    Heart failure Maternal Grandmother    Heart attack Maternal Grandfather    Dementia Paternal Grandmother    Dementia Paternal Grandfather    Diabetes Maternal Aunt     Past Surgical History:  Procedure Laterality Date   ABDOMINAL HYSTERECTOMY  11/10/2014   at [facility], Exp lap,  supracervical hyst, BSO, infracolic omentectomy, lymphadenectomy, aortic lymph node sampling   BIOPSY  11/04/2021   Procedure: BIOPSY;  Surgeon: Lynann Bologna, MD;  Location: [facility];  Service: Gastroenterology;;   BRONCHIAL NEEDLE ASPIRATION BIOPSY  12/16/2020   Procedure: BRONCHIAL NEEDLE ASPIRATION BIOPSIES;  Surgeon: Oretha Milch, MD;  Location: [facility] ENDOSCOPY;  Service: Cardiopulmonary;;   BRONCHIAL WASHINGS  12/16/2020   Procedure: BRONCHIAL WASHINGS;  Surgeon: Oretha Milch, MD;  Location: [facility] ENDOSCOPY;  Service: Cardiopulmonary;;   BUNIONECTOMY Left 09/14/2021   CARPAL TUNNEL RELEASE Left 09/07/2022   Procedure: LEFT CARPAL TUNNEL RELEASE, LEFT THUMB CARPOMETACARPAL INJECTION;  Surgeon: Tarry Kos, MD;  Location: [facility];  Service: Orthopedics;  Laterality: Left;   CHOLECYSTECTOMY N/A 07/24/2018   Procedure: LAPAROSCOPIC CHOLECYSTECTOMY WITH INTRAOPERATIVE CHOLANGIOGRAM;  Surgeon: Darnell Level, MD;  Location: [facility];  Service: General;  Laterality: N/A;   COLONOSCOPY WITH PROPOFOL N/A 11/04/2021   Procedure: COLONOSCOPY WITH PROPOFOL;  Surgeon: Lynann Bologna, MD;  Location: [facility];  Service: Gastroenterology;  Laterality: N/A;   ERCP N/A 07/26/2018   Procedure: ENDOSCOPIC RETROGRADE CHOLANGIOPANCREATOGRAPHY (ERCP);  Surgeon: Meryl Dare, MD;  Location: [facility];  Service: Endoscopy;  Laterality: N/A;   ESOPHAGOGASTRODUODENOSCOPY (EGD) WITH PROPOFOL N/A 11/04/2021   Procedure: ESOPHAGOGASTRODUODENOSCOPY (EGD) WITH PROPOFOL;  Surgeon: Lynann Bologna, MD;  [facility]: [facility];  Service: Gastroenterology;  Laterality: N/A;   GANGLION CYST EXCISION Right    hand   HYSTERECTOMY ABDOMINAL WITH SALPINGO-OOPHORECTOMY  11/10/2014   ovarian cancer, tumor removal   INCISIONAL HERNIA REPAIR N/A 12/25/2020   Procedure: LAPAROSCOPIC INCISIONAL HERNIA REPAIR WITH MESH;  Surgeon: Kinsinger, De Blanch, MD;  Location: [facility];  Service: General;  Laterality: N/A;    ingrown nail removal     IR GENERIC HISTORICAL  05/12/2016   IR REMOVAL TUN ACCESS W/ PORT W/O FL MOD SED 05/12/2016 [facility]   LAPAROSCOPIC ENDOMETRIOSIS FULGURATION     LIPOMA EXCISION Left    ankle   meniscal tear Right    MENISCUS REPAIR Right    oral sugery     POLYPECTOMY  11/04/2021   Procedure: POLYPECTOMY;  Surgeon: Lynann Bologna, MD;  Location: [facility];  Service: Gastroenterology;;   Shelda Pal a cath removal  PORTA CATH INSERTION     REMOVAL OF STONES  07/26/2018   Procedure: REMOVAL OF STONES;  Surgeon: Meryl Dare, MD;  [facility];  Service: Endoscopy;;   SPHINCTEROTOMY  07/26/2018   Procedure: Dennison Mascot;  Surgeon: Meryl Dare, MD;  Location: [facility];  Service: Endoscopy;;   UMBILICAL HERNIA REPAIR  12/29/2020   VIDEO BRONCHOSCOPY WITH ENDOBRONCHIAL ULTRASOUND N/A 12/16/2020   Procedure: VIDEO BRONCHOSCOPY WITH ENDOBRONCHIAL ULTRASOUND;  Surgeon: Oretha Milch, MD;  Location: [facility] ENDOSCOPY;  Service: Cardiopulmonary;  Laterality: N/A;   Social History   Occupational History   Occupation: Educational psychologist: [employer]  Tobacco Use   Smoking status: Never    Passive exposure: Past   Smokeless tobacco: Never  Vaping Use   Vaping Use: Never used  Substance and Sexual Activity   Alcohol use: Not Currently   Drug use: No   Sexual activity: Not on file

## 2022-09-28 ENCOUNTER — Other Ambulatory Visit: Payer: Self-pay | Admitting: Physician Assistant

## 2022-09-28 ENCOUNTER — Other Ambulatory Visit: Payer: Self-pay

## 2022-09-28 ENCOUNTER — Other Ambulatory Visit (HOSPITAL_BASED_OUTPATIENT_CLINIC_OR_DEPARTMENT_OTHER): Payer: Self-pay

## 2022-09-28 ENCOUNTER — Other Ambulatory Visit: Payer: Self-pay | Admitting: Gastroenterology

## 2022-09-28 MED ORDER — CLOBETASOL PROPIONATE 0.05 % EX CREA
1.0000 | TOPICAL_CREAM | Freq: Two times a day (BID) | CUTANEOUS | 0 refills | Status: DC | PRN
  Filled 2022-09-28: qty 45, 30d supply, fill #0

## 2022-09-28 MED ORDER — OMEPRAZOLE 40 MG PO CPDR
40.0000 mg | DELAYED_RELEASE_CAPSULE | Freq: Every day | ORAL | 3 refills | Status: DC
  Filled 2022-09-28: qty 30, 30d supply, fill #0
  Filled 2022-11-09: qty 30, 30d supply, fill #1
  Filled 2022-12-09: qty 30, 30d supply, fill #2
  Filled 2023-01-12: qty 30, 30d supply, fill #3

## 2022-09-28 NOTE — Telephone Encounter (Signed)
Last Fill: 01/26/2022  Next Visit: 11/29/2022  Last Visit: 06/28/2022  Current Dose per office note on 06/28/2022: not discussed  Okay to refill Clobetasol Cream?

## 2022-09-29 ENCOUNTER — Telehealth: Payer: Self-pay | Admitting: Orthopaedic Surgery

## 2022-09-29 NOTE — Telephone Encounter (Signed)
[employer] forms received. To [employer]. 

## 2022-09-30 ENCOUNTER — Ambulatory Visit: Payer: Commercial Managed Care - PPO | Admitting: Clinical

## 2022-10-04 ENCOUNTER — Other Ambulatory Visit: Payer: Self-pay

## 2022-10-04 ENCOUNTER — Encounter (HOSPITAL_BASED_OUTPATIENT_CLINIC_OR_DEPARTMENT_OTHER): Payer: Self-pay | Admitting: Orthopaedic Surgery

## 2022-10-04 ENCOUNTER — Telehealth (INDEPENDENT_AMBULATORY_CARE_PROVIDER_SITE_OTHER): Payer: Commercial Managed Care - PPO | Admitting: Psychology

## 2022-10-06 ENCOUNTER — Ambulatory Visit (INDEPENDENT_AMBULATORY_CARE_PROVIDER_SITE_OTHER): Payer: Commercial Managed Care - PPO | Admitting: Family Medicine

## 2022-10-11 NOTE — Progress Notes (Signed)

## 2022-10-12 ENCOUNTER — Encounter (HOSPITAL_BASED_OUTPATIENT_CLINIC_OR_DEPARTMENT_OTHER): Payer: Self-pay | Admitting: Orthopaedic Surgery

## 2022-10-12 ENCOUNTER — Ambulatory Visit (HOSPITAL_BASED_OUTPATIENT_CLINIC_OR_DEPARTMENT_OTHER): Payer: Commercial Managed Care - PPO | Admitting: Anesthesiology

## 2022-10-12 ENCOUNTER — Encounter (HOSPITAL_BASED_OUTPATIENT_CLINIC_OR_DEPARTMENT_OTHER)
Admission: RE | Disposition: A | Discharge status: Home / Self Care | Payer: Self-pay | Source: Home / Self Care | Attending: Orthopaedic Surgery

## 2022-10-12 ENCOUNTER — Ambulatory Visit (HOSPITAL_BASED_OUTPATIENT_CLINIC_OR_DEPARTMENT_OTHER)
Admission: RE | Admit: 2022-10-12 | Discharge: 2022-10-12 | Disposition: A | Discharge status: Home / Self Care | Payer: Commercial Managed Care - PPO | Attending: Orthopaedic Surgery | Admitting: Orthopaedic Surgery

## 2022-10-12 ENCOUNTER — Other Ambulatory Visit: Payer: Self-pay

## 2022-10-12 ENCOUNTER — Other Ambulatory Visit (HOSPITAL_BASED_OUTPATIENT_CLINIC_OR_DEPARTMENT_OTHER): Payer: Self-pay

## 2022-10-12 DIAGNOSIS — K219 Gastro-esophageal reflux disease without esophagitis: Secondary | ICD-10-CM | POA: Insufficient documentation

## 2022-10-12 DIAGNOSIS — M67441 Ganglion, right hand: Secondary | ICD-10-CM | POA: Insufficient documentation

## 2022-10-12 DIAGNOSIS — G5601 Carpal tunnel syndrome, right upper limb: Secondary | ICD-10-CM

## 2022-10-12 DIAGNOSIS — E039 Hypothyroidism, unspecified: Secondary | ICD-10-CM

## 2022-10-12 DIAGNOSIS — Z6841 Body Mass Index (BMI) 40.0 and over, adult: Secondary | ICD-10-CM | POA: Insufficient documentation

## 2022-10-12 DIAGNOSIS — G4730 Sleep apnea, unspecified: Secondary | ICD-10-CM | POA: Insufficient documentation

## 2022-10-12 DIAGNOSIS — M1811 Unilateral primary osteoarthritis of first carpometacarpal joint, right hand: Secondary | ICD-10-CM | POA: Insufficient documentation

## 2022-10-12 DIAGNOSIS — J45909 Unspecified asthma, uncomplicated: Secondary | ICD-10-CM | POA: Diagnosis not present

## 2022-10-12 DIAGNOSIS — Z79899 Other long term (current) drug therapy: Secondary | ICD-10-CM | POA: Insufficient documentation

## 2022-10-12 DIAGNOSIS — F32A Depression, unspecified: Secondary | ICD-10-CM | POA: Insufficient documentation

## 2022-10-12 DIAGNOSIS — J45990 Exercise induced bronchospasm: Secondary | ICD-10-CM | POA: Insufficient documentation

## 2022-10-12 DIAGNOSIS — I10 Essential (primary) hypertension: Secondary | ICD-10-CM | POA: Insufficient documentation

## 2022-10-12 DIAGNOSIS — Z01818 Encounter for other preprocedural examination: Secondary | ICD-10-CM

## 2022-10-12 DIAGNOSIS — F419 Anxiety disorder, unspecified: Secondary | ICD-10-CM | POA: Diagnosis not present

## 2022-10-12 HISTORY — PX: CARPAL TUNNEL RELEASE: SHX101

## 2022-10-12 SURGERY — CARPAL TUNNEL RELEASE
Anesthesia: Monitor Anesthesia Care | Site: Wrist | Laterality: Right

## 2022-10-12 MED ORDER — LACTATED RINGERS IV SOLN: INTRAVENOUS | Status: DC

## 2022-10-12 MED ORDER — LIDOCAINE HCL (PF) 1 % IJ SOLN
INTRAMUSCULAR | Status: AC
  Filled 2022-10-12: qty 30

## 2022-10-12 MED ORDER — FENTANYL CITRATE (PF) 100 MCG/2ML IJ SOLN
INTRAMUSCULAR | Status: DC | PRN
  Administered 2022-10-12 (×2): 50 ug via INTRAVENOUS

## 2022-10-12 MED ORDER — PROPOFOL 500 MG/50ML IV EMUL
INTRAVENOUS | Status: AC
  Filled 2022-10-12: qty 50

## 2022-10-12 MED ORDER — MIDAZOLAM HCL 2 MG/2ML IJ SOLN
INTRAMUSCULAR | Status: AC
  Filled 2022-10-12: qty 2

## 2022-10-12 MED ORDER — TRIAMCINOLONE ACETONIDE 40 MG/ML IJ SUSP
INTRAMUSCULAR | Status: DC | PRN
  Administered 2022-10-12: 40 mg via INTRAMUSCULAR

## 2022-10-12 MED ORDER — PROMETHAZINE HCL 25 MG/ML IJ SOLN: 6.2500 mg | INTRAMUSCULAR | Status: DC | PRN

## 2022-10-12 MED ORDER — ONDANSETRON HCL 4 MG/2ML IJ SOLN
INTRAMUSCULAR | Status: AC
  Filled 2022-10-12: qty 2

## 2022-10-12 MED ORDER — BUPIVACAINE HCL (PF) 0.25 % IJ SOLN
INTRAMUSCULAR | Status: DC | PRN
  Administered 2022-10-12: 5 mL

## 2022-10-12 MED ORDER — FENTANYL CITRATE (PF) 100 MCG/2ML IJ SOLN: 25.0000 ug | INTRAMUSCULAR | Status: DC | PRN

## 2022-10-12 MED ORDER — LIDOCAINE HCL (CARDIAC) PF 100 MG/5ML IV SOSY
PREFILLED_SYRINGE | INTRAVENOUS | Status: DC | PRN
  Administered 2022-10-12: 50 mg via INTRAVENOUS

## 2022-10-12 MED ORDER — ACETAMINOPHEN 500 MG PO TABS
1000.0000 mg | ORAL_TABLET | Freq: Once | ORAL | Status: AC
  Administered 2022-10-12: 1000 mg via ORAL

## 2022-10-12 MED ORDER — KETOROLAC TROMETHAMINE 30 MG/ML IJ SOLN
INTRAMUSCULAR | Status: AC
  Filled 2022-10-12: qty 1

## 2022-10-12 MED ORDER — ACETAMINOPHEN 500 MG PO TABS
ORAL_TABLET | ORAL | Status: AC
  Filled 2022-10-12: qty 2

## 2022-10-12 MED ORDER — PROPOFOL 10 MG/ML IV BOLUS
INTRAVENOUS | Status: DC | PRN
  Administered 2022-10-12 (×2): 30 mg via INTRAVENOUS

## 2022-10-12 MED ORDER — CEFAZOLIN SODIUM-DEXTROSE 1-4 GM/50ML-% IV SOLN
INTRAVENOUS | Status: AC
  Filled 2022-10-12: qty 50

## 2022-10-12 MED ORDER — OXYCODONE HCL 5 MG/5ML PO SOLN: 5.0000 mg | Freq: Once | ORAL | Status: AC | PRN

## 2022-10-12 MED ORDER — LIDOCAINE-EPINEPHRINE (PF) 1 %-1:200000 IJ SOLN
INTRAMUSCULAR | Status: AC
  Filled 2022-10-12: qty 30

## 2022-10-12 MED ORDER — PROPOFOL 500 MG/50ML IV EMUL
INTRAVENOUS | Status: DC | PRN
  Administered 2022-10-12: 75 ug/kg/min via INTRAVENOUS

## 2022-10-12 MED ORDER — BUPIVACAINE HCL (PF) 0.25 % IJ SOLN
INTRAMUSCULAR | Status: AC
  Filled 2022-10-12: qty 30

## 2022-10-12 MED ORDER — OXYCODONE HCL 5 MG PO TABS
ORAL_TABLET | ORAL | Status: AC
  Filled 2022-10-12: qty 1

## 2022-10-12 MED ORDER — MIDAZOLAM HCL 5 MG/5ML IJ SOLN
INTRAMUSCULAR | Status: DC | PRN
  Administered 2022-10-12: 2 mg via INTRAVENOUS

## 2022-10-12 MED ORDER — CEFAZOLIN SODIUM-DEXTROSE 2-4 GM/100ML-% IV SOLN
INTRAVENOUS | Status: AC
  Filled 2022-10-12: qty 100

## 2022-10-12 MED ORDER — AMISULPRIDE (ANTIEMETIC) 5 MG/2ML IV SOLN: 10.0000 mg | Freq: Once | INTRAVENOUS | Status: DC | PRN

## 2022-10-12 MED ORDER — KETOROLAC TROMETHAMINE 30 MG/ML IJ SOLN: 30.0000 mg | Freq: Once | INTRAMUSCULAR | Status: DC | PRN

## 2022-10-12 MED ORDER — LIDOCAINE-EPINEPHRINE (PF) 1 %-1:200000 IJ SOLN
INTRAMUSCULAR | Status: DC | PRN
  Administered 2022-10-12: 5 mL

## 2022-10-12 MED ORDER — TRIAMCINOLONE ACETONIDE 40 MG/ML IJ SUSP
INTRAMUSCULAR | Status: AC
  Filled 2022-10-12: qty 5

## 2022-10-12 MED ORDER — ONDANSETRON HCL 4 MG/2ML IJ SOLN
INTRAMUSCULAR | Status: DC | PRN
  Administered 2022-10-12: 4 mg via INTRAVENOUS

## 2022-10-12 MED ORDER — FENTANYL CITRATE (PF) 100 MCG/2ML IJ SOLN
INTRAMUSCULAR | Status: AC
  Filled 2022-10-12: qty 2

## 2022-10-12 MED ORDER — OXYCODONE HCL 5 MG PO TABS
5.0000 mg | ORAL_TABLET | Freq: Once | ORAL | Status: AC | PRN
  Administered 2022-10-12: 5 mg via ORAL

## 2022-10-12 MED ORDER — CEFAZOLIN IN SODIUM CHLORIDE 3-0.9 GM/100ML-% IV SOLN
3.0000 g | INTRAVENOUS | Status: AC
  Administered 2022-10-12: 3 g via INTRAVENOUS

## 2022-10-12 MED ORDER — KETOROLAC TROMETHAMINE 30 MG/ML IJ SOLN
INTRAMUSCULAR | Status: DC | PRN
  Administered 2022-10-12: 30 mg via INTRAVENOUS

## 2022-10-12 MED ORDER — LIDOCAINE 2% (20 MG/ML) 5 ML SYRINGE
INTRAMUSCULAR | Status: AC
  Filled 2022-10-12: qty 5

## 2022-10-12 MED ORDER — HYDROCODONE-ACETAMINOPHEN 5-325 MG PO TABS
1.0000 | ORAL_TABLET | Freq: Four times a day (QID) | ORAL | 0 refills | Status: DC | PRN
  Filled 2022-10-12: qty 20, 5d supply, fill #0

## 2022-10-12 SURGICAL SUPPLY — 48 items
BAND INSRT 18 STRL LF DISP RB (MISCELLANEOUS) ×2
BAND RUBBER #18 3X1/16 STRL (MISCELLANEOUS) ×2 IMPLANT
BLADE MINI RND TIP GREEN BEAV (BLADE) ×1 IMPLANT
BLADE SURG 15 STRL LF DISP TIS (BLADE) ×1 IMPLANT
BLADE SURG 15 STRL SS (BLADE) ×1
BNDG ADH 1X3 FABRIC TAN LF (GAUZE/BANDAGES/DRESSINGS) IMPLANT
BNDG ADH 3X1 STRCH TAN LF (GAUZE/BANDAGES/DRESSINGS)
BNDG CMPR 5X3 KNIT ELC UNQ LF (GAUZE/BANDAGES/DRESSINGS) ×1
BNDG CMPR 9X4 STRL LF SNTH (GAUZE/BANDAGES/DRESSINGS) ×1
BNDG ELASTIC 3INX 5YD STR LF (GAUZE/BANDAGES/DRESSINGS) ×1 IMPLANT
BNDG ESMARK 4X9 LF (GAUZE/BANDAGES/DRESSINGS) ×1 IMPLANT
BRUSH SCRUB EZ PLAIN DRY (MISCELLANEOUS) ×1 IMPLANT
CANISTER SUCT 1200ML W/VALVE (MISCELLANEOUS) ×1 IMPLANT
CORD BIPOLAR FORCEPS 12FT (ELECTRODE) ×1 IMPLANT
COVER BACK TABLE 60X90IN (DRAPES) ×1 IMPLANT
CUFF TOURN SGL QUICK 18X4 (TOURNIQUET CUFF) ×1 IMPLANT
DRAPE EXTREMITY T 121X128X90 (DISPOSABLE) ×1 IMPLANT
DRAPE SURG 17X23 STRL (DRAPES) ×1 IMPLANT
DRAPE U-SHAPE 47X51 STRL (DRAPES) ×1 IMPLANT
DURAPREP 26ML APPLICATOR (WOUND CARE) ×1 IMPLANT
GAUZE SPONGE 4X4 12PLY STRL (GAUZE/BANDAGES/DRESSINGS) ×1 IMPLANT
GAUZE XEROFORM 1X8 LF (GAUZE/BANDAGES/DRESSINGS) ×1 IMPLANT
GLOVE BIOGEL PI IND STRL 7.5 (GLOVE) ×1 IMPLANT
GLOVE ECLIPSE 7.0 STRL STRAW (GLOVE) ×1 IMPLANT
GLOVE INDICATOR 7.0 STRL GRN (GLOVE) ×1 IMPLANT
GLOVE SURG SYN 7.5 E (GLOVE) ×2 IMPLANT
GLOVE SURG SYN 7.5 PF PI (GLOVE) ×2 IMPLANT
GOWN STRL REUS W/ TWL LRG LVL3 (GOWN DISPOSABLE) ×1 IMPLANT
GOWN STRL REUS W/TWL LRG LVL3 (GOWN DISPOSABLE) ×1
GOWN STRL SURGICAL XL XLNG (GOWN DISPOSABLE) ×2 IMPLANT
NDL HYPO 25X1 1.5 SAFETY (NEEDLE) ×1 IMPLANT
NDL SAFETY ECLIP 18X1.5 (MISCELLANEOUS) IMPLANT
NEEDLE HYPO 25X1 1.5 SAFETY (NEEDLE) ×1 IMPLANT
NS IRRIG 1000ML POUR BTL (IV SOLUTION) ×1 IMPLANT
PACK BASIN DAY SURGERY FS (CUSTOM PROCEDURE TRAY) ×1 IMPLANT
PAD ALCOHOL SWAB (MISCELLANEOUS) ×4 IMPLANT
PAD CAST 3X4 CTTN HI CHSV (CAST SUPPLIES) ×1 IMPLANT
PADDING CAST COTTON 3X4 STRL (CAST SUPPLIES) ×1
SHEET MEDIUM DRAPE 40X70 STRL (DRAPES) ×1 IMPLANT
SPIKE FLUID TRANSFER (MISCELLANEOUS) IMPLANT
SPONGE T-LAP 18X18 ~~LOC~~+RFID (SPONGE) ×1 IMPLANT
STOCKINETTE 4X48 STRL (DRAPES) ×1 IMPLANT
SUT ETHILON 3 0 PS 1 (SUTURE) ×1 IMPLANT
SYR BULB EAR ULCER 3OZ GRN STR (SYRINGE) ×1 IMPLANT
SYR CONTROL 10ML LL (SYRINGE) IMPLANT
TOWEL GREEN STERILE FF (TOWEL DISPOSABLE) ×1 IMPLANT
TRAY DSU PREP LF (CUSTOM PROCEDURE TRAY) ×1 IMPLANT
UNDERPAD 30X36 HEAVY ABSORB (UNDERPADS AND DIAPERS) ×1 IMPLANT

## 2022-10-12 NOTE — Anesthesia Postprocedure Evaluation (Signed)
Anesthesia Post Note  Patient: [name]  Procedure(s) Performed: RIGHT CARPAL TUNNEL RELEASE, RIGHT THUMB CARPOMETACARPAL INJECTION (Right: Wrist)     Patient location during evaluation: PACU Anesthesia Type: MAC Level of consciousness: awake Pain management: pain level controlled Vital Signs Assessment: post-procedure vital signs reviewed and stable Respiratory status: spontaneous breathing, nonlabored ventilation and respiratory function stable Cardiovascular status: blood pressure returned to baseline and stable Postop Assessment: no apparent nausea or vomiting Anesthetic complications: no   No notable events documented.  Last Vitals:  Vitals:   10/12/22 1415 10/12/22 1450  BP: 121/89 114/75  Pulse: 66 67  Resp: 13 18  Temp:  (!) 36.3 C  SpO2: 92% 98%    Last Pain:  Vitals:   10/12/22 1429  TempSrc:   PainSc: 3                  Yee Gangi P Naida Escalante

## 2022-10-12 NOTE — H&P (Signed)
PREOPERATIVE H&P  Chief Complaint: right carpal tunnel syndrome. right thumb carpometacarpal osteoarthritis  HPI: [name] is a 52 y.o. female who presents for surgical treatment of right carpal tunnel syndrome. right thumb carpometacarpal osteoarthritis.  She denies any changes in medical history.  Past Medical History:  Diagnosis Date   Anxiety    Arthritis    Asthma    Back pain    Barrett's esophagus    Chemotherapy-induced neuropathy (HCC)    Chronic lower back pain    Depression    Dyspnea    Elevated blood pressure, situational 05/05/2016   Endometriosis    Exercise-induced asthma 09/08/2015   Fatigue    Gallbladder problem    GERD (gastroesophageal reflux disease)    Heartburn    History of kidney stones    Hypertension    Hypothyroidism    Infertility, female    Joint pain    Joint stiffness    Lower extremity edema    Migraine    ovarian cancer    Ovarian cancer, bilateral (HCC) 11/28/2014   PONV (postoperative nausea and vomiting)    Sarcoidosis    Sleep apnea    SOBOE (shortness of breath on exertion)    Thyroid disease    Umbilical hernia    Vitamin D deficiency    woke up while porta cath removed    Past Surgical History:  Procedure Laterality Date   ABDOMINAL HYSTERECTOMY  11/10/2014   at [facility], Exp lap, supracervical hyst, BSO, infracolic omentectomy, lymphadenectomy, aortic lymph node sampling   BIOPSY  11/04/2021   Procedure: BIOPSY;  Surgeon: Lynann Bologna, MD;  Location: [facility];  Service: Gastroenterology;;   BRONCHIAL NEEDLE ASPIRATION BIOPSY  12/16/2020   Procedure: BRONCHIAL NEEDLE ASPIRATION BIOPSIES;  Surgeon: Oretha Milch, MD;  Location: [facility];  Service: Cardiopulmonary;;   BRONCHIAL WASHINGS  12/16/2020   Procedure: BRONCHIAL WASHINGS;  Surgeon: Oretha Milch, MD;  Location: [facility];  Service: Cardiopulmonary;;   BUNIONECTOMY Left 09/14/2021   CARPAL TUNNEL RELEASE Left 09/07/2022   Procedure: LEFT CARPAL  TUNNEL RELEASE, LEFT THUMB CARPOMETACARPAL INJECTION;  Surgeon: Tarry Kos, MD;  Location: [facility];  Service: Orthopedics;  Laterality: Left;   CHOLECYSTECTOMY N/A 07/24/2018   Procedure: LAPAROSCOPIC CHOLECYSTECTOMY WITH INTRAOPERATIVE CHOLANGIOGRAM;  Surgeon: Darnell Level, MD;  [facility];  Service: General;  Laterality: N/A;   COLONOSCOPY WITH PROPOFOL N/A 11/04/2021   Procedure: COLONOSCOPY WITH PROPOFOL;  Surgeon: Lynann Bologna, MD;  [facility];  Service: Gastroenterology;  Laterality: N/A;   ERCP N/A 07/26/2018   Procedure: ENDOSCOPIC RETROGRADE CHOLANGIOPANCREATOGRAPHY (ERCP);  Surgeon: Meryl Dare, MD;  Location: [facility];  Service: Endoscopy;  Laterality: N/A;   ESOPHAGOGASTRODUODENOSCOPY (EGD) WITH PROPOFOL N/A 11/04/2021   Procedure: ESOPHAGOGASTRODUODENOSCOPY (EGD) WITH PROPOFOL;  Surgeon: Lynann Bologna, MD;  [facility];  Service: Gastroenterology;  Laterality: N/A;   GANGLION CYST EXCISION Right    hand   HYSTERECTOMY ABDOMINAL WITH SALPINGO-OOPHORECTOMY  11/10/2014   ovarian cancer, tumor removal   INCISIONAL HERNIA REPAIR N/A 12/25/2020   Procedure: LAPAROSCOPIC INCISIONAL HERNIA REPAIR WITH MESH;  Surgeon: Kinsinger, De Blanch, MD;  Location: [facility];  Service: General;  Laterality: N/A;   ingrown nail removal     IR GENERIC HISTORICAL  05/12/2016   IR REMOVAL TUN ACCESS W/ PORT W/O FL MOD SED 05/12/2016 [facility]   LAPAROSCOPIC ENDOMETRIOSIS FULGURATION     LIPOMA EXCISION Left    ankle   meniscal tear  Right    MENISCUS REPAIR Right    oral sugery     POLYPECTOMY  11/04/2021   Procedure: POLYPECTOMY;  Surgeon: Lynann Bologna, MD;  Location: [facility];  Service: Gastroenterology;;   Shelda Pal a cath removal     PORTA CATH INSERTION     REMOVAL OF STONES  07/26/2018   Procedure: REMOVAL OF STONES;  Surgeon: Meryl Dare, MD;  Location: [facility];  Service: Endoscopy;;   SPHINCTEROTOMY  07/26/2018    Procedure: Dennison Mascot;  Surgeon: Meryl Dare, MD;  [facility];  Service: Endoscopy;;   UMBILICAL HERNIA REPAIR  12/29/2020   VIDEO BRONCHOSCOPY WITH ENDOBRONCHIAL ULTRASOUND N/A 12/16/2020   Procedure: VIDEO BRONCHOSCOPY WITH ENDOBRONCHIAL ULTRASOUND;  Surgeon: Oretha Milch, MD;  Location: [facility] ENDOSCOPY;  Service: Cardiopulmonary;  Laterality: N/A;   Social History   Socioeconomic History   Marital status: Married    Spouse name: Ross   Number of children: 0   Years of education: Not on file   Highest education level: Not on file  Occupational History   Occupation: Midwife payable    Employer: [employer]  Tobacco Use   Smoking status: Never    Passive exposure: Past   Smokeless tobacco: Never  Vaping Use   Vaping Use: Never used  Substance and Sexual Activity   Alcohol use: Not Currently   Drug use: No   Sexual activity: Not on file  Other Topics Concern   Not on file  Social History Narrative   Lives with boyfriend in a one story home.  Has no children.     Works in Marine scientist at [employer].     Education: college.   Right handed    Drinks caffeine   Social Determinants of Health   Financial Resource Strain: Not on file  Food Insecurity: Not on file  Transportation Needs: Not on file  Physical Activity: Not on file  Stress: Not on file  Social Connections: Not on file   Family History  Problem Relation Age of Onset   Hyperlipidemia Mother    Hypertension Mother    Diabetes Mother    Stroke Mother        Deceased   Heart disease Mother    Osteoarthritis Mother    Atrial fibrillation Father        Living   Stroke Father        optic nerve left eye   Healthy Brother    Heart failure Maternal Grandmother    Heart attack Maternal Grandfather    Dementia Paternal Grandmother    Dementia Paternal Grandfather    Diabetes Maternal Aunt    Allergies  Allergen Reactions   Moxifloxacin Anaphylaxis    needs epinephrine shot   Quinolones  Anaphylaxis, Shortness Of Breath and Swelling   Sulfonamide Derivatives Hives, Swelling and Rash    needs epinephrine shot--rash and lip swelling   Doxycycline Nausea And Vomiting   Escitalopram Oxalate Other (See Comments)     fatigue   Lisinopril Cough   Prior to Admission medications   Medication Sig Start Date End Date Taking? Authorizing Provider  ALPRAZolam Prudy Feeler) 1 MG tablet Take 1 tablet (1 mg total) by mouth 2 (two) times daily as needed for anxiety. 07/25/22  Yes Corwin Levins, MD  amphetamine-dextroamphetamine (ADDERALL XR) 20 MG 24 hr capsule Take 1 capsule (20 mg total) by mouth daily. 09/12/22  Yes Corwin Levins, MD  buPROPion (WELLBUTRIN XL) 150 MG 24 hr tablet Take 1 tablet (  150 mg total) by mouth daily. 12/17/21  Yes Corwin Levins, MD  clobetasol cream (TEMOVATE) 0.05 % Apply 1 Application topically 2 (two) times daily as needed. 09/28/22  Yes Gearldine Bienenstock, PA-C  cyclobenzaprine (FLEXERIL) 5 MG tablet Take 1 tablet (5 mg total) by mouth 2 (two) times daily as needed for muscle spasms. 07/27/22  Yes Patel, Donika K, DO  estradiol (ESTRACE) 1 MG tablet Take 1 tablet (1 mg total) by mouth daily. 12/17/21  Yes Corwin Levins, MD  fluticasone-salmeterol (ADVAIR DISKUS) 250-50 MCG/ACT AEPB Inhale 1 puff into the lungs in the morning and at bedtime. 07/21/22  Yes Corwin Levins, MD  folic acid (FOLVITE) 1 MG tablet Take 2 tablets (2 mg total) by mouth daily. 01/26/22  Yes Gearldine Bienenstock, PA-C  gabapentin (NEURONTIN) 600 MG tablet Take 1.5 tablets (900 mg total) by mouth 2 (two) times daily. OK to take extra dose as needed 07/27/22 07/27/23 Yes Patel, Donika K, DO  HYDROcodone-acetaminophen (NORCO) 5-325 MG tablet Take 1 tablet by mouth 2 (two) times daily as needed. 09/16/22  Yes Cristie Hem, PA-C  levothyroxine (SYNTHROID) 100 MCG tablet Take 1 tablet (100 mcg total) by mouth daily. 12/17/21  Yes Corwin Levins, MD  losartan (COZAAR) 25 MG tablet Take 1 tablet (25 mg total) by mouth daily.  01/18/22  Yes Corwin Levins, MD  methotrexate 50 MG/2ML injection Inject 0.8 mLs (20 mg total) into the skin once a week. 08/15/22  Yes Gearldine Bienenstock, PA-C  Multiple Vitamin (MULTIVITAMIN PO) Take by mouth.   Yes [provider]  omeprazole (PRILOSEC) 40 MG capsule Take 1 capsule (40 mg total) by mouth daily. 09/28/22  Yes Lynann Bologna, MD  Semaglutide-Weight Management (WEGOVY) 2.4 MG/0.75ML SOAJ Inject 2.4 mg into the skin once a week. 08/11/22  Yes Bowen, Scot Jun, DO  sertraline (ZOLOFT) 100 MG tablet Take 2 tablets (200 mg total) by mouth daily. 08/12/22  Yes Corwin Levins, MD  traZODone (DESYREL) 50 MG tablet Take 0.5-1 tablets (25-50 mg total) by mouth at bedtime as needed for sleep. 08/04/22  Yes Corwin Levins, MD  Vitamin D, Ergocalciferol, (DRISDOL) 1.25 MG (50000 UNIT) CAPS capsule Take 1 capsule (50,000 Units total) by mouth every 7 (seven) days. 09/12/22  Yes Bowen, Scot Jun, DO  benzonatate (TESSALON) 100 MG capsule Take by mouth 3 (three) times daily as needed for cough.    [provider]  Blood Pressure Monitoring (OMRON 3 SERIES BP MONITOR) DEVI Use as directed. 12/09/20     polyvinyl alcohol (ARTIFICIAL TEARS) 1.4 % ophthalmic solution Place 1 drop into both eyes as needed for dry eyes. 10/29/21   Corwin Levins, MD  Zoster Vaccine Adjuvanted [facility]) injection Inject into the muscle. 11/26/21   Judyann Munson, MD     Positive ROS: All other systems have been reviewed and were otherwise negative with the exception of those mentioned in the HPI and as above.  Physical Exam: General: Alert, no acute distress Cardiovascular: No pedal edema Respiratory: No cyanosis, no use of accessory musculature GI: abdomen soft Skin: No lesions in the area of chief complaint Neurologic: Sensation intact distally Psychiatric: Patient is competent for consent with normal mood and affect Lymphatic: no lymphedema  MUSCULOSKELETAL: exam stable  Assessment: right carpal tunnel syndrome.  right thumb carpometacarpal osteoarthritis  Plan: Plan for Procedure(s): RIGHT CARPAL TUNNEL RELEASE, RIGHT THUMB CARPOMETACARPAL INJECTION  The risks benefits and alternatives were discussed with the patient including  but not limited to the risks of nonoperative treatment, versus surgical intervention including infection, bleeding, nerve injury,  blood clots, cardiopulmonary complications, morbidity, mortality, among others, and they were willing to proceed.   Glee Arvin, MD 10/12/2022 11:35 AM

## 2022-10-12 NOTE — Anesthesia Procedure Notes (Signed)
Procedure Name: Intubation Date/Time: 10/12/2022 1:26 PM  Performed by: Cleda Clarks, CRNAPre-anesthesia Checklist: Patient identified, Emergency Drugs available, Suction available and Patient being monitored Patient Re-evaluated:Patient Re-evaluated prior to induction Oxygen Delivery Method: Simple face mask Tube type: Oral Number of attempts: 1 Airway Equipment and Method: Stylet and Oral airway Placement Confirmation: ETT inserted through vocal cords under direct vision, positive ETCO2 and breath sounds checked- equal and bilateral Tube secured with: Tape Dental Injury: Teeth and Oropharynx as per pre-operative assessment

## 2022-10-12 NOTE — Transfer of Care (Signed)
Immediate Anesthesia Transfer of Care Note  Patient: [name]  Procedure(s) Performed: RIGHT CARPAL TUNNEL RELEASE, RIGHT THUMB CARPOMETACARPAL INJECTION (Right: Wrist)  Patient Location: PACU  Anesthesia Type:MAC  Level of Consciousness: awake, alert , and oriented  Airway & Oxygen Therapy: Patient Spontanous Breathing and Patient connected to face mask oxygen  Post-op Assessment: Report given to RN and Post -op Vital signs reviewed and stable  Post vital signs: Reviewed and stable  Last Vitals:  Vitals Value Taken Time  BP 108/60 10/12/22 1400  Temp 36.1 C 10/12/22 1400  Pulse 77 10/12/22 1401  Resp 14 10/12/22 1401  SpO2 95 % 10/12/22 1401  Vitals shown include unvalidated device data.  Last Pain:  Vitals:   10/12/22 1129  TempSrc: Temporal  PainSc: 0-No pain      Patients Stated Pain Goal: 3 (10/12/22 1129)  Complications: No notable events documented.

## 2022-10-12 NOTE — Anesthesia Preprocedure Evaluation (Addendum)
Anesthesia Evaluation  Patient identified by MRN, date of birth, ID band Patient awake    Reviewed: Allergy & Precautions, NPO status , Patient's Chart, lab work & pertinent test results  History of Anesthesia Complications (+) PONV  Airway Mallampati: II  TM Distance: >3 FB Neck ROM: Full    Dental no notable dental hx.    Pulmonary asthma , sleep apnea and Continuous Positive Airway Pressure Ventilation    Pulmonary exam normal        Cardiovascular hypertension, Pt. on medications Normal cardiovascular exam     Neuro/Psych  Headaches PSYCHIATRIC DISORDERS Anxiety Depression       GI/Hepatic Neg liver ROS,GERD  Medicated and Controlled,,  Endo/Other  Hypothyroidism  Morbid obesity  Renal/GU negative Renal ROS     Musculoskeletal  (+) Arthritis ,    Abdominal  (+) + obese  Peds  Hematology negative hematology ROS (+)   Anesthesia Other Findings right carpal tunnel syndrome. right thumb carpometacarpal osteoarthritis  Reproductive/Obstetrics                             Anesthesia Physical Anesthesia Plan  ASA: 3  Anesthesia Plan: MAC   Post-op Pain Management:    Induction: Intravenous  PONV Risk Score and Plan: 3 and Ondansetron, Dexamethasone, Propofol infusion, Midazolam and Treatment may vary due to age or medical condition  Airway Management Planned: Simple Face Mask  Additional Equipment:   Intra-op Plan:   Post-operative Plan:   Informed Consent: I have reviewed the patients History and Physical, chart, labs and discussed the procedure including the risks, benefits and alternatives for the proposed anesthesia with the patient or authorized representative who has indicated his/her understanding and acceptance.     Dental advisory given  Plan Discussed with: CRNA  Anesthesia Plan Comments:        Anesthesia Quick Evaluation

## 2022-10-12 NOTE — Discharge Instructions (Addendum)
Postoperative instructions:  Weightbearing instructions:  Post Anesthesia Home Care Instructions Next motrin 9pm Activity: Get plenty of rest for the remainder of the day. A responsible individual must stay with you for 24 hours following the procedure.  For the next 24 hours, DO NOT: -Drive a car -Advertising copywriter -Drink alcoholic beverages -Take any medication unless instructed by your physician -Make any legal decisions or sign important papers.  Meals: Start with liquid foods such as gelatin or soup. Progress to regular foods as tolerated. Avoid greasy, spicy, heavy foods. If nausea and/or vomiting occur, drink only clear liquids until the nausea and/or vomiting subsides. Call your physician if vomiting continues.  Special Instructions/Symptoms: Your throat may feel dry or sore from the anesthesia or the breathing tube placed in your throat during surgery. If this causes discomfort, gargle with warm salt water. The discomfort should disappear within 24 hours.  If you had a scopolamine patch placed behind your ear for the management of post- operative nausea and/or vomiting:  1. The medication in the patch is effective for 72 hours, after which it should be removed.  Wrap patch in a tissue and discard in the trash. Wash hands thoroughly with soap and water. 2. You may remove the patch earlier than 72 hours if you experience unpleasant side effects which may include dry mouth, dizziness or visual disturbances. 3. Avoid touching the patch. Wash your hands with soap and water after contact with the patch.    Post Anesthesia Home Care Instructions  Activity: Get plenty of rest for the remainder of the day. A responsible individual must stay with you for 24 hours following the procedure.  For the next 24 hours, DO NOT: -Drive a car -Advertising copywriter -Drink alcoholic beverages -Take any medication unless instructed by your physician -Make any legal decisions or sign important  papers.  Meals: Start with liquid foods such as gelatin or soup. Progress to regular foods as tolerated. Avoid greasy, spicy, heavy foods. If nausea and/or vomiting occur, drink only clear liquids until the nausea and/or vomiting subsides. Call your physician if vomiting continues.  Special Instructions/Symptoms: Your throat may feel dry or sore from the anesthesia or the breathing tube placed in your throat during surgery. If this causes discomfort, gargle with warm salt water. The discomfort should disappear within 24 hours.  If you had a scopolamine patch placed behind your ear for the management of post- operative nausea and/or vomiting:  1. The medication in the patch is effective for 72 hours, after which it should be removed.  Wrap patch in a tissue and discard in the trash. Wash hands thoroughly with soap and water. 2. You may remove the patch earlier than 72 hours if you experience unpleasant side effects which may include dry mouth, dizziness or visual disturbances. 3. Avoid touching the patch. Wash your hands with soap and water after contact with the patch.   on't lift more than 10 lbs for 4 weeks  Dressing instructions: Keep your dressing and/or splint clean and dry at all times.  It will be removed at your first post-operative appointment.  Your stitches and/or staples will be removed at this visit.  Incision instructions:  Do not soak your incision for 3 weeks after surgery.  If the incision gets wet, pat dry and do not scrub the incision.  Pain control:  You have been given a prescription to be taken as directed for post-operative pain control.  In addition, elevate the operative extremity above the heart  at all times to prevent swelling and throbbing pain.  Take over-the-counter Colace, 100mg  by mouth twice a day while taking narcotic pain medications to help prevent constipation.  Follow up appointments: 1) 10 days for suture removal and wound check. 2) Tessa Lerner as  scheduled.   -------------------------------------------------------------------------------------------------------------  After Surgery Pain Control:  After your surgery, post-surgical discomfort or pain is likely. This discomfort can last several days to a few weeks. At certain times of the day your discomfort may be more intense.  Did you receive a nerve block?  A nerve block can provide pain relief for one hour to two days after your surgery. As long as the nerve block is working, you will experience little or no sensation in the area the surgeon operated on.  As the nerve block wears off, you will begin to experience pain or discomfort. It is very important that you begin taking your prescribed pain medication before the nerve block fully wears off. Treating your pain at the first sign of the block wearing off will ensure your pain is better controlled and more tolerable when full-sensation returns. Do not wait until the pain is intolerable, as the medicine will be less effective. It is better to treat pain in advance than to try and catch up.  General Anesthesia:  If you did not receive a nerve block during your surgery, you will need to start taking your pain medication shortly after your surgery and should continue to do so as prescribed by your surgeon.  Pain Medication:  Most commonly we prescribe Vicodin and Percocet for post-operative pain. Both of these medications contain a combination of acetaminophen (Tylenol) and a narcotic to help control pain.   It takes between 30 and 45 minutes before pain medication starts to work. It is important to take your medication before your pain level gets too intense.   Nausea is a common side effect of many pain medications. You will want to eat something before taking your pain medicine to help prevent nausea.   If you are taking a prescription pain medication that contains acetaminophen, we recommend that you do not take additional over the  counter acetaminophen (Tylenol).  Other pain relieving options:   Using a cold pack to ice the affected area a few times a day (15 to 20 minutes at a time) can help to relieve pain, reduce swelling and bruising.   Elevation of the affected area can also help to reduce pain and swelling.

## 2022-10-12 NOTE — Op Note (Signed)
   DATE OF SURGERY:10/12/2022  PREOPERATIVE DIAGNOSIS:   Right carpal tunnel syndrome Right thumb CMC osteoarthritis Right index finger flexor tendon sheath ganglion cyst  POSTOPERATIVE DIAGNOSIS: same  PROCEDURE:  Right carpal tunnel release. CPT [id number] Aspiration of right index finger flexor tendon sheath ganglion cyst Injection of right thumb CMC joint  SURGEON: Dusty Raczkowski Glee Arvin, M.D.  ASSIST: Oneal Grout, PA-C  ANESTHESIA:  Local and MAC  TOURNIQUET TIME: less than 20 minutes  BLOOD LOSS: Minimal.  COMPLICATIONS: None.  PATHOLOGY: None.  INDICATIONS: The patient is a 52 y.o. -year-old female who presented with carpal tunnel syndrome failing nonsurgical management, indicated for surgical release.  DESCRIPTION OF PROCEDURE: The patient was identified in the preoperative holding area.  The operative site was marked by the surgeon and confirmed by the patient.  The patient was brought back to the operating room.  MAC anesthesia was administered.  Local anesthetic with epi was injected into the operative site.  A well padded nonsterile tourniquet was placed. The operative extremity was prepped and draped in standard sterile fashion.  A timeout was performed.  Preoperative antibiotics were given.   A palmar incision was made about 5 mm ulnar to the thenar crease.  The palmar aponeurosis was exposed and divided in line with the skin incision. The palmaris brevis was visualized and divided.  The distal edge of the transcarpal ligament was identified. A hemostat was inserted into the carpal tunnel to protect the median nerve and the flexor tendons. Then, the transverse carpal ligament was released under direct visualization. Proximally, a subcutaneous tunnel was made allowing a Sewell retractor to be placed. Then, the distal portion of the antebrachial fascia was released. Distally, all fibrous bands were released. The median nerve was visualized, and the fat pad was exposed.  Wound was irrigated and closed with 4-0 nylon sutures. An 18 gauge needle was then advanced directly into the ganglion cyst which was palpable directly over the A1 pulley of the index finger.  The cyst was decompressed immediately.  We then injected the thumb CMC joint with 1/3 cc each of marcaine, lidocaine and kenalog.  Sterile dressing applied. The patient was transferred to the recovery room in stable condition after all counts were correct.  POSTOPERATIVE PLAN: To start nerve gliding exercises as tolerated and no heavy lifting for four weeks.  Glee Arvin, M.D. [facility] 1:50 PM

## 2022-10-15 ENCOUNTER — Encounter (HOSPITAL_BASED_OUTPATIENT_CLINIC_OR_DEPARTMENT_OTHER): Payer: Self-pay | Admitting: Orthopaedic Surgery

## 2022-10-17 ENCOUNTER — Telehealth: Payer: Self-pay | Admitting: Orthopaedic Surgery

## 2022-10-17 NOTE — Telephone Encounter (Signed)
Refaxed the 10/05/22 [employer] forms to [employer] [phone] per patients request.

## 2022-10-19 ENCOUNTER — Other Ambulatory Visit: Payer: Self-pay | Admitting: Internal Medicine

## 2022-10-19 ENCOUNTER — Other Ambulatory Visit: Payer: Self-pay

## 2022-10-19 ENCOUNTER — Other Ambulatory Visit (HOSPITAL_BASED_OUTPATIENT_CLINIC_OR_DEPARTMENT_OTHER): Payer: Self-pay

## 2022-10-19 MED ORDER — AMPHETAMINE-DEXTROAMPHET ER 20 MG PO CP24
20.0000 mg | ORAL_CAPSULE | Freq: Every day | ORAL | 0 refills | Status: DC
  Filled 2022-10-19: qty 30, 30d supply, fill #0

## 2022-10-20 ENCOUNTER — Other Ambulatory Visit: Payer: Self-pay

## 2022-10-21 ENCOUNTER — Other Ambulatory Visit (HOSPITAL_COMMUNITY): Payer: Self-pay

## 2022-10-21 ENCOUNTER — Other Ambulatory Visit (HOSPITAL_BASED_OUTPATIENT_CLINIC_OR_DEPARTMENT_OTHER): Payer: Self-pay

## 2022-10-21 ENCOUNTER — Ambulatory Visit (INDEPENDENT_AMBULATORY_CARE_PROVIDER_SITE_OTHER): Payer: Commercial Managed Care - PPO | Admitting: Physician Assistant

## 2022-10-21 DIAGNOSIS — G5601 Carpal tunnel syndrome, right upper limb: Secondary | ICD-10-CM

## 2022-10-21 DIAGNOSIS — Z9889 Other specified postprocedural states: Secondary | ICD-10-CM

## 2022-10-21 MED ORDER — HYDROCODONE-ACETAMINOPHEN 5-325 MG PO TABS
1.0000 | ORAL_TABLET | Freq: Two times a day (BID) | ORAL | 0 refills | Status: DC | PRN
  Filled 2022-10-21: qty 20, 10d supply, fill #0

## 2022-10-21 NOTE — Progress Notes (Signed)
Post-Op Visit Note   Patient: [name]           Date of Birth: [birth year]           MRN: [id number] Visit Date: 10/21/2022 PCP: Corwin Levins, MD   Assessment & Plan:  Chief Complaint:  Chief Complaint  Patient presents with   Right Hand - Routine Post Op   Visit Diagnoses:  1. Carpal tunnel syndrome on right   2. S/P carpal tunnel release     Plan: Patient is a pleasant 52 year old female who comes in today 1 week status post right carpal tunnel release, date of surgery 10/12/2022.  She has been doing okay.  She is taking Tylenol, Percocet and Norco for pain.  She notes paresthesias to her right thumb.  Examination of her right hand reveals a well-healing surgical incision with nylon sutures in place.  No evidence of infection or cellulitis.  She does have marked ecchymosis throughout the hand and into the volar forearm.  Fingers are warm well-perfused.  She does have decreased sensation to the thumb.  Today, wound was cleaned and recovered.  Velcro splint applied.  She will wear this at all times for the next week.  Follow-up next week for suture movable.  No heavy lifting or submerging her hand underwater for another 3 weeks.  I refilled her Norco.  Call with concerns or questions.  Follow-Up Instructions: Return in about 1 week (around 10/28/2022).   Orders:  No orders of the defined types were placed in this encounter.  No orders of the defined types were placed in this encounter.   Imaging: No new imaging  PMFS History: Patient Active Problem List   Diagnosis Date Noted   Carpal tunnel syndrome on right 10/12/2022   Ganglion cyst of flexor tendon sheath of finger of right hand 10/12/2022   Primary osteoarthritis of first carpometacarpal joint of right hand 10/12/2022   Adjustment disorder with mixed anxiety and depressed mood 09/12/2022   Carpal tunnel syndrome on left 09/07/2022   Eating disorder 08/11/2022   Blurry vision, bilateral 06/28/2022   Primary  osteoarthritis of first carpometacarpal joint of left hand 06/28/2022   Hoarseness 06/18/2022   Insulin resistance 05/18/2022   Major depressive disorder 03/09/2022   Polyphagia 03/09/2022   Left foot pain 02/16/2022   Osteoarthritis of both knees 01/13/2022   Hot flashes due to menopause 12/19/2021   ADD (attention deficit disorder) 12/19/2021   SOBOE (shortness of breath on exertion) 11/16/2021   Elevated glucose 11/16/2021   Vitamin D deficiency 11/16/2021   Prediabetes 11/16/2021   Bunion, left 08/06/2021   Morbid obesity (HCC) with initial BMI 52 06/26/2021   Venous insufficiency 06/25/2021   Incisional hernia 12/25/2020   Mediastinal lymphadenopathy    Pulmonary nodules 10/20/2020   Multiple skin nodules 07/24/2020   Right lumbar radiculopathy 05/18/2020   Periumbilical hernia 05/18/2020   TB lung, latent 05/18/2020   Acute non-recurrent maxillary sinusitis 11/09/2019   HTN (hypertension) 04/14/2019   Mass of left wrist 04/14/2019   External otitis of right ear 04/14/2019   Choledocholithiasis    Abnormal cholangiogram    Cholecystitis with cholelithiasis 07/24/2018   Hot flash not due to menopause 10/16/2017   Eczema 10/16/2017   Flu-like symptoms 06/20/2017   Cognitive changes 01/30/2017   Disturbed concentration 01/26/2017   Left shoulder pain 11/01/2016   Right low back pain 11/01/2016   Elevated blood pressure, situational 05/05/2016   Surgical menopause 01/29/2016   OSA on  CPAP 12/22/2015   Cough 11/26/2015   Capsulitis 10/06/2015   Ganglion cyst of left foot 10/06/2015   Fatigue 09/13/2015   Asthma 09/08/2015   Exertional dyspnea 09/08/2015   Peroneal ganglion cyst 07/20/2015   Low back pain 07/20/2015   Peroneal tendinitis of left lower leg 06/25/2015   Nonallopathic lesion of cervical region 06/25/2015   Wheezing 05/08/2015   Polyarthralgia 05/08/2015   Peripheral edema 03/16/2015   Central line complication 03/16/2015   Chemotherapy induced  neutropenia (HCC) 02/28/2015   Screening for colorectal cancer 01/05/2015   Chemotherapy induced nausea and vomiting 01/02/2015   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   Premature surgical menopause 12/17/2014   Leukopenia due to antineoplastic chemotherapy (HCC) 12/17/2014   Port-A-Cath in place 12/17/2014   Encounter for antineoplastic chemotherapy 12/17/2014   Myalgia 12/09/2014   Hypersensitivity reaction 12/05/2014   Poor venous access 11/28/2014   Postoperative cellulitis of surgical wound 11/24/2014   Malignant neoplasm of both ovaries 11/10/2014   Anxiety 11/10/2014   Adnexal mass 11/10/2014   Encounter for well adult exam with abnormal findings 09/10/2014   Hypothyroidism 09/10/2014   Hypersomnolence 09/10/2014   Acute non-recurrent frontal sinusitis 06/24/2014   Right knee pain 06/24/2014   Lower back pain 01/08/2014   EAR PAIN, BILATERAL 12/18/2009   KNEE PAIN, RIGHT 12/18/2009   INGROWN TOENAIL 12/17/2008   BACK PAIN 12/17/2008   Acute bronchospasm 11/11/2008   Depression 11/29/2007   Insomnia 11/29/2007   Allergic rhinitis 04/13/2007   GERD 04/13/2007   Endometriosis determined by laparoscopy 04/13/2007   Migraine headache 01/29/2007   Past Medical History:  Diagnosis Date   Anxiety    Arthritis    Asthma    Back pain    Barrett's esophagus    Chemotherapy-induced neuropathy (HCC)    Chronic lower back pain    Depression    Dyspnea    Elevated blood pressure, situational 05/05/2016   Endometriosis    Exercise-induced asthma 09/08/2015   Fatigue    Gallbladder problem    GERD (gastroesophageal reflux disease)    Heartburn    History of kidney stones    Hypertension    Hypothyroidism    Infertility, female    Joint pain    Joint stiffness    Lower extremity edema    Migraine    ovarian cancer    Ovarian cancer, bilateral (HCC) 11/28/2014   PONV (postoperative nausea and vomiting)    Sarcoidosis    Sleep apnea    SOBOE  (shortness of breath on exertion)    Thyroid disease    Umbilical hernia    Vitamin D deficiency    woke up while porta cath removed     Family History  Problem Relation Age of Onset   Hyperlipidemia Mother    Hypertension Mother    Diabetes Mother    Stroke Mother        Deceased   Heart disease Mother    Osteoarthritis Mother    Atrial fibrillation Father        Living   Stroke Father        optic nerve left eye   Healthy Brother    Heart failure Maternal Grandmother    Heart attack Maternal Grandfather    Dementia Paternal Grandmother    Dementia Paternal Grandfather    Diabetes Maternal Aunt     Past Surgical History:  Procedure Laterality Date   ABDOMINAL HYSTERECTOMY  11/10/2014   at [facility], Exp lap, supracervical hyst,  BSO, infracolic omentectomy, lymphadenectomy, aortic lymph node sampling   BIOPSY  11/04/2021   Procedure: BIOPSY;  Surgeon: Lynann Bologna, MD;  Location: [facility];  Service: Gastroenterology;;   BRONCHIAL NEEDLE ASPIRATION BIOPSY  12/16/2020   Procedure: BRONCHIAL NEEDLE ASPIRATION BIOPSIES;  Surgeon: Oretha Milch, MD;  Location: [facility] ENDOSCOPY;  Service: Cardiopulmonary;;   BRONCHIAL WASHINGS  12/16/2020   Procedure: BRONCHIAL WASHINGS;  Surgeon: Oretha Milch, MD;  Location: [facility] ENDOSCOPY;  Service: Cardiopulmonary;;   BUNIONECTOMY Left 09/14/2021   CARPAL TUNNEL RELEASE Left 09/07/2022   Procedure: LEFT CARPAL TUNNEL RELEASE, LEFT THUMB CARPOMETACARPAL INJECTION;  Surgeon: Tarry Kos, MD;  Location: [facility];  Service: Orthopedics;  Laterality: Left;   CARPAL TUNNEL RELEASE Right 10/12/2022   Procedure: RIGHT CARPAL TUNNEL RELEASE, RIGHT THUMB CARPOMETACARPAL INJECTION;  Surgeon: Tarry Kos, MD;  Location: [facility];  Service: Orthopedics;  Laterality: Right;   CHOLECYSTECTOMY N/A 07/24/2018   Procedure: LAPAROSCOPIC CHOLECYSTECTOMY WITH INTRAOPERATIVE CHOLANGIOGRAM;  Surgeon: Darnell Level, MD;  Location: [facility];   Service: General;  Laterality: N/A;   COLONOSCOPY WITH PROPOFOL N/A 11/04/2021   Procedure: COLONOSCOPY WITH PROPOFOL;  Surgeon: Lynann Bologna, MD;  Location: [facility];  Service: Gastroenterology;  Laterality: N/A;   ERCP N/A 07/26/2018   Procedure: ENDOSCOPIC RETROGRADE CHOLANGIOPANCREATOGRAPHY (ERCP);  Surgeon: Meryl Dare, MD;  Location: [facility];  Service: Endoscopy;  Laterality: N/A;   ESOPHAGOGASTRODUODENOSCOPY (EGD) WITH PROPOFOL N/A 11/04/2021   Procedure: ESOPHAGOGASTRODUODENOSCOPY (EGD) WITH PROPOFOL;  Surgeon: Lynann Bologna, MD;  Location: [facility];  Service: Gastroenterology;  Laterality: N/A;   GANGLION CYST EXCISION Right    hand   HYSTERECTOMY ABDOMINAL WITH SALPINGO-OOPHORECTOMY  11/10/2014   ovarian cancer, tumor removal   INCISIONAL HERNIA REPAIR N/A 12/25/2020   Procedure: LAPAROSCOPIC INCISIONAL HERNIA REPAIR WITH MESH;  Surgeon: Kinsinger, De Blanch, MD;  Location: [facility];  Service: General;  Laterality: N/A;   ingrown nail removal     IR GENERIC HISTORICAL  05/12/2016   IR REMOVAL TUN ACCESS W/ PORT W/O FL MOD SED 05/12/2016 [facility]   LAPAROSCOPIC ENDOMETRIOSIS FULGURATION     LIPOMA EXCISION Left    ankle   meniscal tear Right    MENISCUS REPAIR Right    oral sugery     POLYPECTOMY  11/04/2021   Procedure: POLYPECTOMY;  Surgeon: Lynann Bologna, MD;  Location: [facility];  Service: Gastroenterology;;   Shelda Pal a cath removal     PORTA CATH INSERTION     REMOVAL OF STONES  07/26/2018   Procedure: REMOVAL OF STONES;  Surgeon: Meryl Dare, MD;  Location: [facility];  Service: Endoscopy;;   SPHINCTEROTOMY  07/26/2018   Procedure: Dennison Mascot;  Surgeon: Meryl Dare, MD;  Location: [facility];  Service: Endoscopy;;   UMBILICAL HERNIA REPAIR  12/29/2020   VIDEO BRONCHOSCOPY WITH ENDOBRONCHIAL ULTRASOUND N/A 12/16/2020   Procedure: VIDEO BRONCHOSCOPY WITH ENDOBRONCHIAL ULTRASOUND;  Surgeon: Oretha Milch, MD;  Location: [facility]  ENDOSCOPY;  Service: Cardiopulmonary;  Laterality: N/A;   Social History   Occupational History   Occupation: Educational psychologist: [employer]  Tobacco Use   Smoking status: Never    Passive exposure: Past   Smokeless tobacco: Never  Vaping Use   Vaping Use: Never used  Substance and Sexual Activity   Alcohol use: Not Currently   Drug use: No   Sexual activity: Not on file

## 2022-10-27 NOTE — Progress Notes (Signed)
Post-Op Visit Note   Patient: [name]           Date of Birth: 03/02/1971           MRN: [id number] Visit Date: 10/28/2022 PCP: Sara Levins, MD   Assessment & Plan:  Chief Complaint:  Chief Complaint  Patient presents with   Right Hand - Routine Post Op   Visit Diagnoses:  1. Carpal tunnel syndrome on right     Plan: [name] is 2 weeks status post right carpal tunnel release.  Doing well overall.  She notices more bruising with the surgery which has caused more soreness and discomfort.  Examination of the right hand and wrist shows healed surgical incision with intact sutures.  No drainage or signs of infection.  No neurovascular compromise.  She does have moderate ecchymosis to the palm of the hand and the volar wrist and forearm.  Sutures removed Steri-Strips applied.  Recommended symptomatic treatment for the bruising and the swelling.  Out of work note for another 4 weeks.  Recheck in 4 weeks.  Follow-Up Instructions: Return in about 4 weeks (around 11/25/2022).   Orders:  No orders of the defined types were placed in this encounter.  No orders of the defined types were placed in this encounter.   Imaging: No results found.  PMFS History: Patient Active Problem List   Diagnosis Date Noted   Carpal tunnel syndrome on right 10/12/2022   Ganglion cyst of flexor tendon sheath of finger of right hand 10/12/2022   Primary osteoarthritis of first carpometacarpal joint of right hand 10/12/2022   Adjustment disorder with mixed anxiety and depressed mood 09/12/2022   Carpal tunnel syndrome on left 09/07/2022   Eating disorder 08/11/2022   Blurry vision, bilateral 06/28/2022   Primary osteoarthritis of first carpometacarpal joint of left hand 06/28/2022   Hoarseness 06/18/2022   Insulin resistance 05/18/2022   Major depressive disorder 03/09/2022   Polyphagia 03/09/2022   Left foot pain 02/16/2022   Osteoarthritis of both knees 01/13/2022   Hot flashes due to  menopause 12/19/2021   ADD (attention deficit disorder) 12/19/2021   SOBOE (shortness of breath on exertion) 11/16/2021   Elevated glucose 11/16/2021   Vitamin D deficiency 11/16/2021   Prediabetes 11/16/2021   Bunion, left 08/06/2021   Morbid obesity (HCC) with initial BMI 52 06/26/2021   Venous insufficiency 06/25/2021   Incisional hernia 12/25/2020   Mediastinal lymphadenopathy    Pulmonary nodules 10/20/2020   Multiple skin nodules 07/24/2020   Right lumbar radiculopathy 05/18/2020   Periumbilical hernia 05/18/2020   TB lung, latent 05/18/2020   Acute non-recurrent maxillary sinusitis 11/09/2019   HTN (hypertension) 04/14/2019   Mass of left wrist 04/14/2019   External otitis of right ear 04/14/2019   Choledocholithiasis    Abnormal cholangiogram    Cholecystitis with cholelithiasis 07/24/2018   Hot flash not due to menopause 10/16/2017   Eczema 10/16/2017   Flu-like symptoms 06/20/2017   Cognitive changes 01/30/2017   Disturbed concentration 01/26/2017   Left shoulder pain 11/01/2016   Right low back pain 11/01/2016   Elevated blood pressure, situational 05/05/2016   Surgical menopause 01/29/2016   OSA on CPAP 12/22/2015   Cough 11/26/2015   Capsulitis 10/06/2015   Ganglion cyst of left foot 10/06/2015   Fatigue 09/13/2015   Asthma 09/08/2015   Exertional dyspnea 09/08/2015   Peroneal ganglion cyst 07/20/2015   Low back pain 07/20/2015   Peroneal tendinitis of left lower leg 06/25/2015   Nonallopathic lesion  of cervical region 06/25/2015   Wheezing 05/08/2015   Polyarthralgia 05/08/2015   Peripheral edema 03/16/2015   Central line complication 03/16/2015   Chemotherapy induced neutropenia (HCC) 02/28/2015   Screening for colorectal cancer 01/05/2015   Chemotherapy induced nausea and vomiting 01/02/2015   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   Premature surgical menopause 12/17/2014   Leukopenia due to antineoplastic chemotherapy (HCC)  12/17/2014   Port-A-Cath in place 12/17/2014   Encounter for antineoplastic chemotherapy 12/17/2014   Myalgia 12/09/2014   Hypersensitivity reaction 12/05/2014   Poor venous access 11/28/2014   Postoperative cellulitis of surgical wound 11/24/2014   Malignant neoplasm of both ovaries 11/10/2014   Anxiety 11/10/2014   Adnexal mass 11/10/2014   Encounter for well adult exam with abnormal findings 09/10/2014   Hypothyroidism 09/10/2014   Hypersomnolence 09/10/2014   Acute non-recurrent frontal sinusitis 06/24/2014   Right knee pain 06/24/2014   Lower back pain 01/08/2014   EAR PAIN, BILATERAL 12/18/2009   KNEE PAIN, RIGHT 12/18/2009   INGROWN TOENAIL 12/17/2008   BACK PAIN 12/17/2008   Acute bronchospasm 11/11/2008   Depression 11/29/2007   Insomnia 11/29/2007   Allergic rhinitis 04/13/2007   GERD 04/13/2007   Endometriosis determined by laparoscopy 04/13/2007   Migraine headache 01/29/2007   Past Medical History:  Diagnosis Date   Anxiety    Arthritis    Asthma    Back pain    Barrett's esophagus    Chemotherapy-induced neuropathy (HCC)    Chronic lower back pain    Depression    Dyspnea    Elevated blood pressure, situational 05/05/2016   Endometriosis    Exercise-induced asthma 09/08/2015   Fatigue    Gallbladder problem    GERD (gastroesophageal reflux disease)    Heartburn    History of kidney stones    Hypertension    Hypothyroidism    Infertility, female    Joint pain    Joint stiffness    Lower extremity edema    Migraine    ovarian cancer    Ovarian cancer, bilateral (HCC) 11/28/2014   PONV (postoperative nausea and vomiting)    Sarcoidosis    Sleep apnea    SOBOE (shortness of breath on exertion)    Thyroid disease    Umbilical hernia    Vitamin D deficiency    woke up while porta cath removed     Family History  Problem Relation Age of Onset   Hyperlipidemia Mother    Hypertension Mother    Diabetes Mother    Stroke Mother        Deceased    Heart disease Mother    Osteoarthritis Mother    Atrial fibrillation Father        Living   Stroke Father        optic nerve left eye   Healthy Brother    Heart failure Maternal Grandmother    Heart attack Maternal Grandfather    Dementia Paternal Grandmother    Dementia Paternal Grandfather    Diabetes Maternal Aunt     Past Surgical History:  Procedure Laterality Date   ABDOMINAL HYSTERECTOMY  11/10/2014   at [facility], Exp lap, supracervical hyst, BSO, infracolic omentectomy, lymphadenectomy, aortic lymph node sampling   BIOPSY  11/04/2021   Procedure: BIOPSY;  Surgeon: Lynann Bologna, MD;  Location: [facility];  Service: Gastroenterology;;   BRONCHIAL NEEDLE ASPIRATION BIOPSY  12/16/2020   Procedure: BRONCHIAL NEEDLE ASPIRATION BIOPSIES;  Surgeon: Oretha Milch, MD;  Location: [facility];  Service:  Cardiopulmonary;;   BRONCHIAL WASHINGS  12/16/2020   Procedure: BRONCHIAL WASHINGS;  Surgeon: Oretha Milch, MD;  Location: [facility];  Service: Cardiopulmonary;;   BUNIONECTOMY Left 09/14/2021   CARPAL TUNNEL RELEASE Left 09/07/2022   Procedure: LEFT CARPAL TUNNEL RELEASE, LEFT THUMB CARPOMETACARPAL INJECTION;  Surgeon: Tarry Kos, MD;  Location: [facility];  Service: Orthopedics;  Laterality: Left;   CARPAL TUNNEL RELEASE Right 10/12/2022   Procedure: RIGHT CARPAL TUNNEL RELEASE, RIGHT THUMB CARPOMETACARPAL INJECTION;  Surgeon: Tarry Kos, MD;  Location: [facility];  Service: Orthopedics;  Laterality: Right;   CHOLECYSTECTOMY N/A 07/24/2018   Procedure: LAPAROSCOPIC CHOLECYSTECTOMY WITH INTRAOPERATIVE CHOLANGIOGRAM;  Surgeon: Darnell Level, MD;  Location: [facility];  Service: General;  Laterality: N/A;   COLONOSCOPY WITH PROPOFOL N/A 11/04/2021   Procedure: COLONOSCOPY WITH PROPOFOL;  Surgeon: Lynann Bologna, MD;  Location: [facility];  Service: Gastroenterology;  Laterality: N/A;   ERCP N/A 07/26/2018   Procedure: ENDOSCOPIC RETROGRADE  CHOLANGIOPANCREATOGRAPHY (ERCP);  Surgeon: Meryl Dare, MD;  Location: [facility];  Service: Endoscopy;  Laterality: N/A;   ESOPHAGOGASTRODUODENOSCOPY (EGD) WITH PROPOFOL N/A 11/04/2021   Procedure: ESOPHAGOGASTRODUODENOSCOPY (EGD) WITH PROPOFOL;  Surgeon: Lynann Bologna, MD;  Location: [facility];  Service: Gastroenterology;  Laterality: N/A;   GANGLION CYST EXCISION Right    hand   HYSTERECTOMY ABDOMINAL WITH SALPINGO-OOPHORECTOMY  11/10/2014   ovarian cancer, tumor removal   INCISIONAL HERNIA REPAIR N/A 12/25/2020   Procedure: LAPAROSCOPIC INCISIONAL HERNIA REPAIR WITH MESH;  Surgeon: Kinsinger, De Blanch, MD;  Location: [facility];  Service: General;  Laterality: N/A;   ingrown nail removal     IR GENERIC HISTORICAL  05/12/2016   IR REMOVAL TUN ACCESS W/ PORT W/O FL MOD SED 05/12/2016 [facility]   LAPAROSCOPIC ENDOMETRIOSIS FULGURATION     LIPOMA EXCISION Left    ankle   meniscal tear Right    MENISCUS REPAIR Right    oral sugery     POLYPECTOMY  11/04/2021   Procedure: POLYPECTOMY;  Surgeon: Lynann Bologna, MD;  Location: [facility];  Service: Gastroenterology;;   Shelda Pal a cath removal     PORTA CATH INSERTION     REMOVAL OF STONES  07/26/2018   Procedure: REMOVAL OF STONES;  Surgeon: Meryl Dare, MD;  Location: [facility];  Service: Endoscopy;;   SPHINCTEROTOMY  07/26/2018   Procedure: Dennison Mascot;  Surgeon: Meryl Dare, MD;  Location: [facility];  Service: Endoscopy;;   UMBILICAL HERNIA REPAIR  12/29/2020   VIDEO BRONCHOSCOPY WITH ENDOBRONCHIAL ULTRASOUND N/A 12/16/2020   Procedure: VIDEO BRONCHOSCOPY WITH ENDOBRONCHIAL ULTRASOUND;  Surgeon: Oretha Milch, MD;  Location: [facility];  Service: Cardiopulmonary;  Laterality: N/A;   Social History   Occupational History   Occupation: Educational psychologist: [employer]  Tobacco Use   Smoking status: Never    Passive exposure: Past   Smokeless tobacco: Never  Vaping Use   Vaping Use: Never  used  Substance and Sexual Activity   Alcohol use: Not Currently   Drug use: No   Sexual activity: Not on file

## 2022-10-28 ENCOUNTER — Ambulatory Visit (INDEPENDENT_AMBULATORY_CARE_PROVIDER_SITE_OTHER): Payer: Commercial Managed Care - PPO | Admitting: Orthopaedic Surgery

## 2022-10-28 DIAGNOSIS — G5601 Carpal tunnel syndrome, right upper limb: Secondary | ICD-10-CM

## 2022-10-31 ENCOUNTER — Ambulatory Visit: Payer: Commercial Managed Care - PPO | Admitting: Clinical

## 2022-11-01 ENCOUNTER — Telehealth (INDEPENDENT_AMBULATORY_CARE_PROVIDER_SITE_OTHER): Payer: Self-pay | Admitting: Family Medicine

## 2022-11-01 ENCOUNTER — Other Ambulatory Visit (HOSPITAL_BASED_OUTPATIENT_CLINIC_OR_DEPARTMENT_OTHER): Payer: Self-pay

## 2022-11-01 ENCOUNTER — Encounter (INDEPENDENT_AMBULATORY_CARE_PROVIDER_SITE_OTHER): Payer: Self-pay | Admitting: Family Medicine

## 2022-11-01 ENCOUNTER — Ambulatory Visit (INDEPENDENT_AMBULATORY_CARE_PROVIDER_SITE_OTHER): Payer: Commercial Managed Care - PPO | Admitting: Family Medicine

## 2022-11-01 VITALS — BP 116/76 | HR 77 | Temp 97.7°F | Ht 67.0 in | Wt 294.0 lb

## 2022-11-01 DIAGNOSIS — E559 Vitamin D deficiency, unspecified: Secondary | ICD-10-CM | POA: Diagnosis not present

## 2022-11-01 DIAGNOSIS — R632 Polyphagia: Secondary | ICD-10-CM

## 2022-11-01 DIAGNOSIS — Z6841 Body Mass Index (BMI) 40.0 and over, adult: Secondary | ICD-10-CM | POA: Diagnosis not present

## 2022-11-01 DIAGNOSIS — F4323 Adjustment disorder with mixed anxiety and depressed mood: Secondary | ICD-10-CM | POA: Diagnosis not present

## 2022-11-01 MED ORDER — QSYMIA 7.5-46 MG PO CP24
1.0000 | ORAL_CAPSULE | Freq: Every morning | ORAL | 0 refills | Status: DC
Start: 2022-11-01 — End: 2022-12-05
  Filled 2022-11-01: qty 30, fill #0

## 2022-11-01 MED ORDER — QSYMIA 3.75-23 MG PO CP24
1.0000 | ORAL_CAPSULE | Freq: Every morning | ORAL | 0 refills | Status: DC
Start: 2022-11-01 — End: 2022-12-05
  Filled 2022-11-01: qty 14, 14d supply, fill #0

## 2022-11-01 NOTE — Progress Notes (Signed)
Office: [phone]  /  Fax: [phone]  WEIGHT SUMMARY AND BIOMETRICS  Starting Date: 11/16/21  Starting Weight: 333lb   Weight Lost Since Last Visit: 0lb   Vitals Temp: 97.7 F (36.5 C) BP: 116/76 Pulse Rate: 77 SpO2: 97 %   Body Composition  Body Fat %: 54.4 % Fat Mass (lbs): 160 lbs Muscle Mass (lbs): 127.4 lbs Visceral Fat Rating : 18     HPI  Chief Complaint: OBESITY  [name] is here to discuss her progress with her obesity treatment plan. She is on the practicing portion control and making smarter food choices, such as increasing vegetables and decreasing simple carbohydrates and states she is following her eating plan approximately 20 % of the time. She states she is getting more steps in.   Interval History:  Since last office visit she is up 9 lb in the past month She has been off Wegovy for a month after her RX ran out She has been hungrier, eating out more with her dad in town and had bilateral carpal tunnel surgery She has a net weight loss of 39 lb in the past 11 mos of medically supervised weight management.  This is an 11.7% TBW loss She has been less consistent with walking and plans to do more She tried counseling but stopped going; looking for a new job She is trying to eat on a schedule and is getting in fruits and veggies  Pharmacotherapy: none   PHYSICAL EXAM:  Blood pressure 116/76, pulse 77, temperature 97.7 F (36.5 C), height 5\' 7"  (1.702 m), weight 294 lb (133.4 kg), last menstrual period 11/02/2014, SpO2 97 %. Body mass index is 46.05 kg/m.  General: She is overweight, cooperative, alert, well developed, and in no acute distress. PSYCH: Has normal mood, affect and thought process.   Lungs: Normal breathing effort, no conversational dyspnea.   ASSESSMENT AND PLAN  TREATMENT PLAN FOR OBESITY:  Recommended Dietary Goals  Marlis is currently in the action stage of change. As such, her goal is to continue weight management  plan. She has agreed to practicing portion control and making smarter food choices, such as increasing vegetables and decreasing simple carbohydrates.  Behavioral Intervention  We discussed the following Behavioral Modification Strategies today: increasing lean protein intake, decreasing simple carbohydrates , increasing vegetables, increasing lower glycemic fruits, increasing water intake, work on meal planning and preparation, work on managing stress, creating time for self-care and relaxation measures, continue to practice mindfulness when eating, planning for success, and better snacking choices.  Additional resources provided today: NA  Recommended Physical Activity Goals  Acadia has been advised to work up to 150 minutes of moderate intensity aerobic activity a week and strengthening exercises 2-3 times per week for cardiovascular health, weight loss maintenance and preservation of muscle mass.   She has agreed to Increase the intensity, frequency or duration of aerobic exercises   - firm up plan for walking, biking with husband or gym at least 3 days/ wk  Pharmacotherapy changes for the treatment of obesity: begin Qsymia 3.75/23 mg daily x 14 days then increase to 7.5/46 mg daily  ASSOCIATED CONDITIONS ADDRESSED TODAY  Adjustment Disorder with Mixed Anxiety and Depressed Mood Assessment & Plan: Improved some being out of work for bilateral carpal tunnel surgery Stress and anxiety is mostly work related and she is looking at options to change jobs She has done CBT and counseling but didn't find it to be very helpful Wellbutrin XL 150 mg has helped some  with emotional eating without SE Her mood has affected her motivation to follow thru with exercise  We discussed a plan to focus on stress reduction, using exercise and having fun with exercise to help boost mood. Continue Wellbutrin XL 150 mg daily.   Morbid obesity: Starting BMI 52.1 -     Qsymia; Take 1 capsule by mouth in the  morning.  Dispense: 14 capsule; Refill: 0 -     Qsymia; Take 1 capsule by mouth in the morning.  Dispense: 30 capsule; Refill: 0  BMI 45.0-49.9, adult (HCC)  Vitamin D deficiency Assessment & Plan: Last vitamin D Lab Results  Component Value Date   VD25OH 44 06/17/2022   She has done well on RX vitamin D weekly and energy level has improved.  D/c RX vitamin D Change to OTC Vitamin D 4,000 international units daily and recheck level in 2 mos   Polyphagia Assessment & Plan: Has worsened off of Wegovy x 1 month and getting off healthy eating plan with her dad in town.  She is motivated to continue to work on weight reduction.  She is doing well with eating on a schedule and grocery shopping.    She no longer has coverage for GLP-1 receptor agonist She will continue to work on eating on schedule with lean protein and fiber with meals, 100 oz of water intake daily. Begin Qsymia 3.75/23 mg qAM x 14 days then increase to 7.5/46 mg qAM.  Reviewed MOA and potential adverse SE.  Informed consent signed.  PDMP reviewed.  BP/ HR are stable  Orders: -     Qsymia; Take 1 capsule by mouth in the morning.  Dispense: 14 capsule; Refill: 0 -     Qsymia; Take 1 capsule by mouth in the morning.  Dispense: 30 capsule; Refill: 0      She was informed of the importance of frequent follow up visits to maximize her success with intensive lifestyle modifications for her multiple health conditions.   ATTESTASTION STATEMENTS:  Reviewed by clinician on day of visit: allergies, medications, problem list, medical history, surgical history, family history, social history, and previous encounter notes pertinent to obesity diagnosis.   I have personally spent 30 minutes total time today in preparation, patient care, nutritional counseling and documentation for this visit, including the following: review of clinical lab tests; review of medical tests/procedures/services.      [name], DO DABFM,  DABOM [facility] [address] [place], [place] [address] [phone]

## 2022-11-01 NOTE — Telephone Encounter (Signed)
Prior authorization done via cover my meds for patients Qsymia. Waiting on determination.  

## 2022-11-01 NOTE — Assessment & Plan Note (Signed)
Last vitamin D Lab Results  Component Value Date   VD25OH 72 06/17/2022   She has done well on RX vitamin D weekly and energy level has improved.  D/c RX vitamin D Change to OTC Vitamin D 4,000 international units daily and recheck level in 2 mos

## 2022-11-01 NOTE — Assessment & Plan Note (Signed)
Has worsened off of Wegovy x 1 month and getting off healthy eating plan with her dad in town.  She is motivated to continue to work on weight reduction.  She is doing well with eating on a schedule and grocery shopping.    She no longer has coverage for GLP-1 receptor agonist She will continue to work on eating on schedule with lean protein and fiber with meals, 100 oz of water intake daily. Begin Qsymia 3.75/23 mg qAM x 14 days then increase to 7.5/46 mg qAM.  Reviewed MOA and potential adverse SE.  Informed consent signed.  PDMP reviewed.  BP/ HR are stable

## 2022-11-01 NOTE — Assessment & Plan Note (Signed)
Improved some being out of work for bilateral carpal tunnel surgery Stress and anxiety is mostly work related and she is looking at options to change jobs She has done CBT and counseling but didn't find it to be very helpful Wellbutrin XL 150 mg has helped some with emotional eating without SE Her mood has affected her motivation to follow thru with exercise  We discussed a plan to focus on stress reduction, using exercise and having fun with exercise to help boost mood. Continue Wellbutrin XL 150 mg daily.

## 2022-11-02 ENCOUNTER — Other Ambulatory Visit (HOSPITAL_BASED_OUTPATIENT_CLINIC_OR_DEPARTMENT_OTHER): Payer: Self-pay

## 2022-11-02 ENCOUNTER — Encounter (INDEPENDENT_AMBULATORY_CARE_PROVIDER_SITE_OTHER): Payer: Self-pay

## 2022-11-02 NOTE — Telephone Encounter (Signed)
Prior authorization approved for patients Qsymia.  11/01/2022-11/15/2022

## 2022-11-03 ENCOUNTER — Encounter: Payer: Self-pay | Admitting: Oncology

## 2022-11-09 ENCOUNTER — Other Ambulatory Visit: Payer: Self-pay | Admitting: *Deleted

## 2022-11-09 ENCOUNTER — Other Ambulatory Visit: Payer: Self-pay | Admitting: Physician Assistant

## 2022-11-09 ENCOUNTER — Other Ambulatory Visit (HOSPITAL_BASED_OUTPATIENT_CLINIC_OR_DEPARTMENT_OTHER): Payer: Self-pay

## 2022-11-09 ENCOUNTER — Other Ambulatory Visit: Payer: Self-pay | Admitting: Internal Medicine

## 2022-11-09 ENCOUNTER — Other Ambulatory Visit: Payer: Self-pay

## 2022-11-09 ENCOUNTER — Other Ambulatory Visit: Payer: Self-pay | Admitting: Emergency Medicine

## 2022-11-09 DIAGNOSIS — F419 Anxiety disorder, unspecified: Secondary | ICD-10-CM

## 2022-11-09 DIAGNOSIS — Z79899 Other long term (current) drug therapy: Secondary | ICD-10-CM | POA: Diagnosis not present

## 2022-11-09 LAB — CBC WITH DIFFERENTIAL/PLATELET
HCT: 41.5 % (ref 35.0–45.0)
MCV: 89.4 fL (ref 80.0–100.0)
MPV: 9.9 fL (ref 7.5–12.5)
Neutrophils Relative %: 69.6 %
Platelets: 325 10*3/uL (ref 140–400)
Total Lymphocyte: 23.3 %

## 2022-11-09 MED ORDER — ALPRAZOLAM 1 MG PO TABS
1.0000 mg | ORAL_TABLET | Freq: Two times a day (BID) | ORAL | 0 refills | Status: DC | PRN
Start: 2022-11-09 — End: 2022-12-09
  Filled 2022-11-09: qty 20, 10d supply, fill #0

## 2022-11-09 NOTE — Telephone Encounter (Signed)
MS is out of the office until Monday 11/14/22. Forwarding to DOD for refill.Marland KitchenRaechel Chute

## 2022-11-09 NOTE — Telephone Encounter (Signed)
Refill sent to pharmacy of record today.  Thanks.

## 2022-11-10 LAB — COMPLETE METABOLIC PANEL WITH GFR
AG Ratio: 1.6 (calc) (ref 1.0–2.5)
ALT: 15 U/L (ref 6–29)
AST: 13 U/L (ref 10–35)
Albumin: 4.4 g/dL (ref 3.6–5.1)
Alkaline phosphatase (APISO): 59 U/L (ref 37–153)
BUN: 15 mg/dL (ref 7–25)
CO2: 29 mmol/L (ref 20–32)
Calcium: 9.7 mg/dL (ref 8.6–10.4)
Chloride: 102 mmol/L (ref 98–110)
Creat: 0.74 mg/dL (ref 0.50–1.03)
Globulin: 2.8 g/dL (calc) (ref 1.9–3.7)
Glucose, Bld: 110 mg/dL — ABNORMAL HIGH (ref 65–99)
Potassium: 4.1 mmol/L (ref 3.5–5.3)
Sodium: 139 mmol/L (ref 135–146)
Total Bilirubin: 0.7 mg/dL (ref 0.2–1.2)
Total Protein: 7.2 g/dL (ref 6.1–8.1)
eGFR: 98 mL/min/{1.73_m2} (ref >=60)

## 2022-11-10 LAB — CBC WITH DIFFERENTIAL/PLATELET
Absolute Monocytes: 644 cells/uL (ref 200–950)
Basophils Absolute: 34 cells/uL (ref 0–200)
Basophils Relative: 0.3 %
Eosinophils Absolute: 124 cells/uL (ref 15–500)
Eosinophils Relative: 1.1 %
Hemoglobin: 14 g/dL (ref 11.7–15.5)
Lymphs Abs: 2633 cells/uL (ref 850–3900)
MCH: 30.2 pg (ref 27.0–33.0)
MCHC: 33.7 g/dL (ref 32.0–36.0)
Monocytes Relative: 5.7 %
Neutro Abs: 7865 cells/uL — ABNORMAL HIGH (ref 1500–7800)
RBC: 4.64 10*6/uL (ref 3.80–5.10)
RDW: 12.1 % (ref 11.0–15.0)
WBC: 11.3 10*3/uL — ABNORMAL HIGH (ref 3.8–10.8)

## 2022-11-10 NOTE — Progress Notes (Signed)
Glucose is 110. Rest of CMP WNL.  WBC count is borderline elevated-11.3 and absolute neutrophils are elevated-please clarify if she has had any recent infections?

## 2022-11-11 ENCOUNTER — Other Ambulatory Visit (HOSPITAL_BASED_OUTPATIENT_CLINIC_OR_DEPARTMENT_OTHER): Payer: Self-pay

## 2022-11-11 NOTE — Progress Notes (Signed)
Patient will need to seek clearance from her surgeon to resume Methotrexate.

## 2022-11-11 NOTE — Progress Notes (Signed)
WBC count is likely elevated due to recent surgeries.

## 2022-11-14 ENCOUNTER — Ambulatory Visit (INDEPENDENT_AMBULATORY_CARE_PROVIDER_SITE_OTHER): Payer: Commercial Managed Care - PPO | Admitting: Licensed Clinical Social Worker

## 2022-11-14 DIAGNOSIS — F411 Generalized anxiety disorder: Secondary | ICD-10-CM | POA: Diagnosis not present
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
## 2022-11-15 ENCOUNTER — Encounter: Payer: Self-pay | Admitting: Internal Medicine

## 2022-11-15 ENCOUNTER — Other Ambulatory Visit (HOSPITAL_BASED_OUTPATIENT_CLINIC_OR_DEPARTMENT_OTHER): Payer: Self-pay

## 2022-11-15 ENCOUNTER — Ambulatory Visit (INDEPENDENT_AMBULATORY_CARE_PROVIDER_SITE_OTHER): Payer: Commercial Managed Care - PPO

## 2022-11-15 ENCOUNTER — Ambulatory Visit: Payer: Commercial Managed Care - PPO | Admitting: Internal Medicine

## 2022-11-15 VITALS — BP 126/80 | HR 77 | Temp 98.3°F | Ht 67.0 in | Wt 295.0 lb

## 2022-11-15 DIAGNOSIS — E038 Other specified hypothyroidism: Secondary | ICD-10-CM | POA: Diagnosis not present

## 2022-11-15 DIAGNOSIS — I10 Essential (primary) hypertension: Secondary | ICD-10-CM | POA: Diagnosis not present

## 2022-11-15 DIAGNOSIS — E559 Vitamin D deficiency, unspecified: Secondary | ICD-10-CM

## 2022-11-15 DIAGNOSIS — Z862 Personal history of diseases of the blood and blood-forming organs and certain disorders involving the immune mechanism: Secondary | ICD-10-CM | POA: Diagnosis not present

## 2022-11-15 DIAGNOSIS — R7303 Prediabetes: Secondary | ICD-10-CM | POA: Diagnosis not present

## 2022-11-15 DIAGNOSIS — F419 Anxiety disorder, unspecified: Secondary | ICD-10-CM | POA: Diagnosis not present

## 2022-11-15 DIAGNOSIS — R059 Cough, unspecified: Secondary | ICD-10-CM

## 2022-11-15 MED ORDER — BUPROPION HCL ER (XL) 300 MG PO TB24
300.0000 mg | ORAL_TABLET | Freq: Every day | ORAL | 3 refills | Status: DC
  Filled 2022-11-15: qty 90, 90d supply, fill #0
  Filled 2023-02-16: qty 90, 90d supply, fill #1
  Filled 2023-05-22: qty 90, 90d supply, fill #2

## 2022-11-15 NOTE — Patient Instructions (Addendum)
Ok to increase the wellbutrin xl to 300 mg per day  Please continue all other medications as before, and refills have been done if requested.  Please have the pharmacy call with any other refills you may need.  Please keep your appointments with your specialists as you may have planned  Please go to the XRAY Department in the first floor for the x-ray testing  You will be contacted by phone if any changes need to be made immediately.  Otherwise, you will receive a letter about your results with an explanation, but please check with MyChart first.  Please make an Appointment to return in 6 months, or sooner if needed

## 2022-11-15 NOTE — Progress Notes (Signed)
Office Visit Note  Patient: [name]             Date of Birth: 1971-04-14           MRN: [id number]             PCP: Corwin Levins, MD Referring: Corwin Levins, MD Visit Date: 11/29/2022 Occupation: @GUAROCC @  Subjective:  Generalized stiffness and soreness   History of Present Illness: [name] is a 52 y.o. female with history of sarcoidosis and osteoarthritis.  Patient remains on Methotrexate 0.58mL sq injections once weekly and folic acid 2mg  by mouth daily.  Patient is tolerating methotrexate without any side effects or injection site reactions.  Patient states that she underwent carpal tunnel release for the left wrist in May 2024 and the right wrist in June 2024 performed by Dr. Roda Shutters.  Patient states that she will be starting physical therapy for her right wrist due to some ongoing paresthesias and stiffness.  Patient states that she had a gap in therapy while taking methotrexate around the time of surgery as recommended.  She states that she has been experiencing increased generalized stiffness and soreness in both the muscles and joints which she attributes to the recent gap in therapy.  She is having increased muscle pain and spasms in her upper back and neck especially on the right side.  She is also had increased discomfort in her hands, knees, and ankle joints.  She has difficulty climbing steps due to discomfort in her knees and ankles.  She has noticed increased swelling in her hands.  She is also had a recurrence of the rash on her hands which previously had resolved with the use of methotrexate.  Patient requested a refill of clobetasol cream to be sent to the pharmacy today.  Activities of Daily Living:  Patient reports morning stiffness for 5 minutes.   Patient Reports nocturnal pain.  Difficulty dressing/grooming: Denies Difficulty climbing stairs: Reports Difficulty getting out of chair: Reports Difficulty using hands for taps, buttons, cutlery, and/or writing:  Reports  Review of Systems  Constitutional:  Positive for fatigue.  HENT:  Positive for mouth dryness. Negative for mouth sores.   Eyes:  Positive for dryness.  Respiratory:  Positive for shortness of breath.   Cardiovascular:  Negative for chest pain and palpitations.  Gastrointestinal:  Negative for blood in stool, constipation and diarrhea.  Endocrine: Negative for increased urination.  Genitourinary:  Negative for involuntary urination.  Musculoskeletal:  Positive for joint pain, gait problem, joint pain, myalgias, muscle weakness, morning stiffness, muscle tenderness and myalgias. Negative for joint swelling.  Skin:  Positive for rash and hair loss. Negative for color change and sensitivity to sunlight.  Allergic/Immunologic: Positive for susceptible to infections.  Neurological:  Positive for dizziness and numbness. Negative for headaches.  Hematological:  Positive for swollen glands.  Psychiatric/Behavioral:  Positive for depressed mood and sleep disturbance. The patient is nervous/anxious.     PMFS History:  Patient Active Problem List   Diagnosis Date Noted   Carpal tunnel syndrome on right 10/12/2022   Ganglion cyst of flexor tendon sheath of finger of right hand 10/12/2022   Primary osteoarthritis of first carpometacarpal joint of right hand 10/12/2022   Adjustment disorder with mixed anxiety and depressed mood 09/12/2022   Carpal tunnel syndrome on left 09/07/2022   Eating disorder 08/11/2022   Blurry vision, bilateral 06/28/2022   Primary osteoarthritis of first carpometacarpal joint of left hand 06/28/2022   Hoarseness 06/18/2022  Insulin resistance 05/18/2022   Major depressive disorder 03/09/2022   Polyphagia 03/09/2022   Left foot pain 02/16/2022   Osteoarthritis of both knees 01/13/2022   Hot flashes due to menopause 12/19/2021   ADD (attention deficit disorder) 12/19/2021   SOBOE (shortness of breath on exertion) 11/16/2021   Elevated glucose 11/16/2021    Vitamin D deficiency 11/16/2021   Prediabetes 11/16/2021   Bunion, left 08/06/2021   Morbid obesity (HCC) with initial BMI 52 06/26/2021   Venous insufficiency 06/25/2021   Incisional hernia 12/25/2020   Mediastinal lymphadenopathy    Pulmonary nodules 10/20/2020   Multiple skin nodules 07/24/2020   Right lumbar radiculopathy 05/18/2020   Periumbilical hernia 05/18/2020   TB lung, latent 05/18/2020   Acute non-recurrent maxillary sinusitis 11/09/2019   HTN (hypertension) 04/14/2019   Mass of left wrist 04/14/2019   External otitis of right ear 04/14/2019   Choledocholithiasis    Abnormal cholangiogram    Cholecystitis with cholelithiasis 07/24/2018   Hot flash not due to menopause 10/16/2017   Eczema 10/16/2017   Flu-like symptoms 06/20/2017   Cognitive changes 01/30/2017   Disturbed concentration 01/26/2017   Left shoulder pain 11/01/2016   Right low back pain 11/01/2016   Elevated blood pressure, situational 05/05/2016   Surgical menopause 01/29/2016   OSA on CPAP 12/22/2015   Cough 11/26/2015   Capsulitis 10/06/2015   Ganglion cyst of left foot 10/06/2015   Fatigue 09/13/2015   Asthma 09/08/2015   Exertional dyspnea 09/08/2015   Peroneal ganglion cyst 07/20/2015   Low back pain 07/20/2015   Peroneal tendinitis of left lower leg 06/25/2015   Nonallopathic lesion of cervical region 06/25/2015   Wheezing 05/08/2015   Polyarthralgia 05/08/2015   Peripheral edema 03/16/2015   Central line complication 03/16/2015   Chemotherapy induced neutropenia (HCC) 02/28/2015   Screening for colorectal cancer 01/05/2015   Chemotherapy induced nausea and vomiting 01/02/2015   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   Premature surgical menopause 12/17/2014   Leukopenia due to antineoplastic chemotherapy (HCC) 12/17/2014   Port-A-Cath in place 12/17/2014   Encounter for antineoplastic chemotherapy 12/17/2014   Myalgia 12/09/2014   Hypersensitivity reaction  12/05/2014   Poor venous access 11/28/2014   Postoperative cellulitis of surgical wound 11/24/2014   Malignant neoplasm of both ovaries 11/10/2014   Anxiety 11/10/2014   Adnexal mass 11/10/2014   Encounter for well adult exam with abnormal findings 09/10/2014   Hypothyroidism 09/10/2014   Hypersomnolence 09/10/2014   Acute non-recurrent frontal sinusitis 06/24/2014   Right knee pain 06/24/2014   Lower back pain 01/08/2014   EAR PAIN, BILATERAL 12/18/2009   KNEE PAIN, RIGHT 12/18/2009   INGROWN TOENAIL 12/17/2008   BACK PAIN 12/17/2008   Acute bronchospasm 11/11/2008   Depression 11/29/2007   Insomnia 11/29/2007   Generalized anxiety disorder 04/13/2007   Allergic rhinitis 04/13/2007   GERD 04/13/2007   Endometriosis determined by laparoscopy 04/13/2007   Migraine headache 01/29/2007    Past Medical History:  Diagnosis Date   Anxiety    Arthritis    Asthma    Back pain    Barrett's esophagus    Chemotherapy-induced neuropathy (HCC)    Chronic lower back pain    Depression    Dyspnea    Elevated blood pressure, situational 05/05/2016   Endometriosis    Exercise-induced asthma 09/08/2015   Fatigue    Gallbladder problem    GERD (gastroesophageal reflux disease)    Heartburn    History of kidney stones    Hypertension  Hypothyroidism    Infertility, female    Joint pain    Joint stiffness    Lower extremity edema    Migraine    ovarian cancer    Ovarian cancer, bilateral (HCC) 11/28/2014   PONV (postoperative nausea and vomiting)    Sarcoidosis    Sleep apnea    SOBOE (shortness of breath on exertion)    Thyroid disease    Umbilical hernia    Vitamin D deficiency    woke up while porta cath removed     Family History  Problem Relation Age of Onset   Hyperlipidemia Mother    Hypertension Mother    Diabetes Mother    Stroke Mother        Deceased   Heart disease Mother    Osteoarthritis Mother    Atrial fibrillation Father        Living   Stroke  Father        optic nerve left eye   Healthy Brother    Diabetes Maternal Aunt    Heart failure Maternal Grandmother    Heart attack Maternal Grandfather    Dementia Paternal Grandmother    Dementia Paternal Grandfather    Past Surgical History:  Procedure Laterality Date   ABDOMINAL HYSTERECTOMY  11/10/2014   at [facility], Exp lap, supracervical hyst, BSO, infracolic omentectomy, lymphadenectomy, aortic lymph node sampling   BIOPSY  11/04/2021   Procedure: BIOPSY;  Surgeon: Lynann Bologna, MD;  Location: [facility];  Service: Gastroenterology;;   BRONCHIAL NEEDLE ASPIRATION BIOPSY  12/16/2020   Procedure: BRONCHIAL NEEDLE ASPIRATION BIOPSIES;  Surgeon: Oretha Milch, MD;  Location: [facility] END[facility];  Service: Cardiopulmonary;;   BRONCHIAL WASHINGS  12/16/2020   Procedure: BRONCHIAL WASHINGS;  Surgeon: Oretha Milch, MD;  Location: [facility] END[facility];  Service: Cardiopulmonary;;   BUNIONECTOMY Left 09/14/2021   CARPAL TUNNEL RELEASE Left 09/07/2022   Procedure: LEFT CARPAL TUNNEL RELEASE, LEFT THUMB CARPOMETACARPAL INJECTION;  Surgeon: Tarry Kos, MD;  Location: [facility];  Service: Orthopedics;  Laterality: Left;   CARPAL TUNNEL RELEASE Right 10/12/2022   Procedure: RIGHT CARPAL TUNNEL RELEASE, RIGHT THUMB CARPOMETACARPAL INJECTION;  Surgeon: Tarry Kos, MD;  Location: [facility];  Service: Orthopedics;  Laterality: Right;   CHOLECYSTECTOMY N/A 07/24/2018   Procedure: LAPAROSCOPIC CHOLECYSTECTOMY WITH INTRAOPERATIVE CHOLANGIOGRAM;  Surgeon: Darnell Level, MD;  Location: [facility];  Service: General;  Laterality: N/A;   COLONOSCOPY WITH PROPOFOL N/A 11/04/2021   Procedure: COLONOSCOPY WITH PROPOFOL;  Surgeon: Lynann Bologna, MD;  Location: [facility];  Service: Gastroenterology;  Laterality: N/A;   ERCP N/A 07/26/2018   Procedure: ENDOSCOPIC RETROGRADE CHOLANGIOPANCREATOGRAPHY (ERCP);  Surgeon: Meryl Dare, MD;  Location: [facility];  Service: Endoscopy;   Laterality: N/A;   ESOPHAGOGASTRODUODENOSCOPY (EGD) WITH PROPOFOL N/A 11/04/2021   Procedure: ESOPHAGOGASTRODUODENOSCOPY (EGD) WITH PROPOFOL;  Surgeon: Lynann Bologna, MD;  Location: [facility];  Service: Gastroenterology;  Laterality: N/A;   GANGLION CYST EXCISION Right    hand   HYSTERECTOMY ABDOMINAL WITH SALPINGO-OOPHORECTOMY  11/10/2014   ovarian cancer, tumor removal   INCISIONAL HERNIA REPAIR N/A 12/25/2020   Procedure: LAPAROSCOPIC INCISIONAL HERNIA REPAIR WITH MESH;  Surgeon: Kinsinger, De Blanch, MD;  Location: [facility];  Service: General;  Laterality: N/A;   ingrown nail removal     IR GENERIC HISTORICAL  05/12/2016   IR REMOVAL TUN ACCESS W/ PORT W/O FL MOD SED 05/12/2016 [facility]   LAPAROSCOPIC ENDOMETRIOSIS FULGURATION     LIPOMA EXCISION Left  ankle   meniscal tear Right    MENISCUS REPAIR Right    oral sugery     POLYPECTOMY  11/04/2021   Procedure: POLYPECTOMY;  Surgeon: Lynann Bologna, MD;  Location: [facility];  Service: Gastroenterology;;   Shelda Pal a cath removal     PORTA CATH INSERTION     REMOVAL OF STONES  07/26/2018   Procedure: REMOVAL OF STONES;  Surgeon: Meryl Dare, MD;  Location: [facility];  Service: Endoscopy;;   SPHINCTEROTOMY  07/26/2018   Procedure: Dennison Mascot;  Surgeon: Meryl Dare, MD;  Location: [facility];  Service: Endoscopy;;   UMBILICAL HERNIA REPAIR  12/29/2020   VIDEO BRONCHOSCOPY WITH ENDOBRONCHIAL ULTRASOUND N/A 12/16/2020   Procedure: VIDEO BRONCHOSCOPY WITH ENDOBRONCHIAL ULTRASOUND;  Surgeon: Oretha Milch, MD;  Location: [facility]COPY;  Service: Cardiopulmonary;  Laterality: N/A;   Social History   Social History Narrative   Lives with boyfriend in a one story home.  Has no children.     Works in Marine scientist at [employer].     Education: college.   Right handed    Drinks caffeine   Immunization History  Administered Date(s) Administered   Influenza,inj,Quad PF,6+ Mos 01/02/2014, 03/26/2015, 12/25/2017, 01/21/2020,  01/25/2021   Influenza-Unspecified 02/23/2016, 02/09/2017, 02/12/2020, 02/14/2022   MMR 02/09/2017, 07/03/2017   PFIZER(Purple Top)SARS-COV-2 Vaccination 08/01/2019, 08/22/2019, 02/22/2020   Tdap 11/07/2017   Tetanus 12/21/2015   Zoster Recombinant(Shingrix) 06/22/2021, 11/26/2021     Objective: Vital Signs: BP 135/86 (BP Location: Left Wrist, Patient Position: Sitting, Cuff Size: Normal)   Pulse 90   Resp 18   Ht 5\' 7"  (1.702 m)   Wt (!) 301 lb 9.6 oz (136.8 kg)   LMP 11/02/2014   BMI 47.24 kg/m    Physical Exam Vitals and nursing note reviewed.  Constitutional:      Appearance: She is well-developed.  HENT:     Head: Normocephalic and atraumatic.  Eyes:     Conjunctiva/sclera: Conjunctivae normal.  Cardiovascular:     Rate and Rhythm: Normal rate and regular rhythm.     Heart sounds: Normal heart sounds.  Pulmonary:     Effort: Pulmonary effort is normal.     Breath sounds: Normal breath sounds.  Abdominal:     General: Bowel sounds are normal.     Palpations: Abdomen is soft.  Musculoskeletal:     Cervical back: Normal range of motion.  Lymphadenopathy:     Cervical: No cervical adenopathy.  Skin:    General: Skin is warm and dry.     Capillary Refill: Capillary refill takes less than 2 seconds.  Neurological:     Mental Status: She is alert and oriented to person, place, and time.  Psychiatric:        Behavior: Behavior normal.      Musculoskeletal Exam: C-spine has limited lateral rotation to the right.  Right trapezius muscle tension and tenderness noted.  No midline spinal tenderness or SI joint tenderness at this time.  Shoulder joints have good range of motion.  Elbow joints, wrist joints, MCPs, PIPs, DIPs have good range of motion with no synovitis.  Some tenderness over the right third MCP and the left fourth MCP joint noted.  Hip joints have good range of motion with no groin pain.  Painful range of motion of both knee joints with some limited extension  in the right knee.  Ankle joints have tenderness bilaterally.  CDAI Exam: CDAI Score: -- Patient Global: --; Provider Global: -- Swollen: 0 ; Tender:  6  Joint Exam 11/29/2022      Right  Left  MCP 3   Tender     MCP 4      Tender  Knee   Tender   Tender  Ankle   Tender   Tender     Investigation: No additional findings.  Imaging: DG Chest 2 View  Result Date: 11/20/2022 CLINICAL DATA:  52 year old female with history of sarcoidosis. Cough. EXAM: CHEST - 2 VIEW COMPARISON:  Chest x-ray 06/15/2020. FINDINGS: Lung volumes are normal. No consolidative airspace disease. No pleural effusions. No pneumothorax. No pulmonary nodule or mass noted. Pulmonary vasculature and the cardiomediastinal silhouette are within normal limits. IMPRESSION: No radiographic evidence of acute cardiopulmonary disease. Electronically Signed   By: Trudie Reed M.D.   On: 11/20/2022 14:02    Recent Labs: Lab Results  Component Value Date   WBC 11.3 (H) 11/09/2022   HGB 14.0 11/09/2022   PLT 325 11/09/2022   NA 139 11/09/2022   K 4.1 11/09/2022   CL 102 11/09/2022   CO2 29 11/09/2022   GLUCOSE 110 (H) 11/09/2022   BUN 15 11/09/2022   CREATININE 0.74 11/09/2022   BILITOT 0.7 11/09/2022   ALKPHOS 68 11/16/2021   AST 13 11/09/2022   ALT 15 11/09/2022   PROT 7.2 11/09/2022   ALBUMIN 4.5 11/16/2021   CALCIUM 9.7 11/09/2022   GFRAA 110 11/11/2020   QFTBGOLDPLUS NEGATIVE 11/11/2020    Speciality Comments: No specialty comments available.  Procedures:  No procedures performed Allergies: Moxifloxacin, Quinolones, Sulfonamide derivatives, Doxycycline, Escitalopram oxalate, and Lisinopril      Assessment / Plan:     Visit Diagnoses: Sarcoidosis - Progression of mediastinal and hilar LN on the CT scan which improved after prednisone, subjective hx of EN lesions, R parotid swelling. Followed by Dr. Vassie Loll.  Patient remains on methotrexate 0.8 mL subcu as injections once weekly along with folic acid 2  mg daily.  She is tolerating methotrexate without any side effects or injection site reactions.  She recently had a gap in therapy after undergoing carpal tunnel release bilaterally.  Patient has noticed increased joint pain, stiffness, and inflammation involving multiple joints which she attributes to the recent gap in therapy.  Her increased myalgias and arthralgias have been interfering with her quality of life.  A prednisone taper starting at 20 mg tapering by 5 mg every 4 days was sent to the pharmacy today.  Instructions were provided.  She will remain on methotrexate as prescribed.  She was vies notify us if her symptoms persist or worsen. CXR updated on 11/15/22: No radiographic evidence of acute cardiopulmonary disease.   Positive ANA (antinuclear antibody) - Positive ANA, positive sicca symptoms and oral ulcers. AVISE lupus index -1.1, ANA 1: 160NS, antiphosphatidylserine IgM positive.  Patient declined to have updated lab work today.  Patient was advised to notify us if she continues to have persistent joint pain, stiffness, and inflammation after completing the prednisone taper.  She will remain on methotrexate as prescribed.  High risk medication use - Methotrexate 0.8 mL sq injections once weekly and folic acid 2mg  by mouth daily. CBC and CMP updated on 11/09/22. Her next lab work will be due in October and every 3 months.  Standing orders for CBC and CMP replaced today. Discussed the importance of holding methotrexate if she develops signs or symptoms of an infection and to resume once the infection has completely cleared.   - Plan: CBC with Differential/Platelet, COMPLETE METABOLIC PANEL WITH GFR  Sialolithiasis: Maxillofacial CT obtained on 03/27/2021: Subtle inflammation of the right parotid gland compatible with mild sialoadenitis.   Primary osteoarthritis of both hands: Patient presents today with increased pain and stiffness in both hands.  She has noticed increased inflammation in both  hands.  She has tenderness over the right third MCP and the left fourth MCP joint on examination today.  Patient underwent carpal tunnel release in the left wrist in May 2024 and the right wrist in June 2024 performed by Dr. Roda Shutters.   A prednisone taper starting at 20 mg tapering by 5 mg every 4 days was sent to the pharmacy today.  Instructions were provided.  She will remain on methotrexate as prescribed.  She was vies notify us if her symptoms persist or worsen.  Primary osteoarthritis of both knees: Chronic pain.  Limited extension of the right knee.  She has been experiencing increased discomfort in both knee joints especially when climbing steps.  She is been having more difficulty with mobility due to the pain and stiffness in both knees over the past several weeks.  Bilateral carpal tunnel syndrome: Underwent surgical release-left May 2024 and right-June 2024.   S/P bunionectomy - left-Performed by Dr. Ardelle Anton on 09/08/21. Held MTX for about 6 weeks around the time of surgery.  Right lumbar radiculopathy: Not currently symptomatic.   Other medical conditions are listed as follows:   Malignant neoplasm of both ovaries (HCC) - 11/09/2014 TAHBSO, CTX X 6 months.  Previously received clearance to initiate immunosuppressive therapy.  Chemotherapy-induced peripheral neuropathy (HCC) - Followed by Dr. Allena Katz  Primary hypertension: BP was 135/86 today in the office.   Acquired hypothyroidism  History of gastroesophageal reflux (GERD)  Migraine without aura and with status migrainosus, not intractable  Mild intermittent asthma with acute exacerbation  OSA on CPAP  Other fatigue  Surgical menopause  Multiple lung nodules  TB lung, latent  Blurry vision, bilateral  Orders: Orders Placed This Encounter  Procedures   CBC with Differential/Platelet   COMPLETE METABOLIC PANEL WITH GFR   Meds ordered this encounter  Medications   predniSONE (DELTASONE) 5 MG tablet    Sig: Take 4  tablets by mouth daily x 4 days, 3 tablets daily x 4 days, 2 tablets daily x 4 days, 1 tablet daily x 4 days.    Dispense:  40 tablet    Refill:  0   clobetasol cream (TEMOVATE) 0.05 %    Sig: Apply 1 Application topically 2 (two) times daily as needed.    Dispense:  45 g    Refill:  0     Follow-Up Instructions: Return in about 5 months (around 05/01/2023) for Sarcoidosis.   Gearldine Bienenstock, PA-C  Note - This record has been created using Dragon software.  Chart creation errors have been sought, but may not always  have been located. Such creation errors do not reflect on  the standard of medical care.

## 2022-11-15 NOTE — Progress Notes (Unsigned)
Patient ID: [name], female   DOB: February 01, 1971, 52 y.o.   MRN: [id number]        Chief Complaint: follow up HTN, HLD and hyperglycemia , anxiety, low vit d       HPI:  [name] is a 52 y.o. 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  Did see counselor yesterday ans continue to follow up.  Feels overall stuck in her position, helpfess and hopeless. Has no where else to turn.  Also incidentally s/p bilateral CTS surgury with very good improvement on the left, still some numbness persistent on the right, but hoping to continue to improved has f/u appt for this July 19.  Denies worsening depressive symptoms, suicidal ideation, or panic; has ongoing anxiety.  Also incidentally with 3 wks onset non prod cough without fever, chills, reflux or asthma , but has had mild sinus congestion and allergies, but she does not think related and asking for cxr.  Pt denies chest pain, increased sob or doe, wheezing, orthopnea, PND, increased LE swelling, palpitations, dizziness or syncope.   Pt denies polydipsia, polyuria, or new focal neuro s/s.          Wt Readings from Last 3 Encounters:  11/15/22 295 lb (133.8 kg)  11/01/22 294 lb (133.4 kg)  10/12/22 294 lb 5 oz (133.5 kg)   BP Readings from Last 3 Encounters:  11/15/22 126/80  11/01/22 116/76  10/12/22 114/75         Past Medical History:  Diagnosis Date   Anxiety    Arthritis    Asthma    Back pain    Barrett's esophagus    Chemotherapy-induced neuropathy (HCC)    Chronic lower back pain    Depression    Dyspnea    Elevated blood pressure, situational 05/05/2016   Endometriosis    Exercise-induced asthma 09/08/2015   Fatigue    Gallbladder problem    GERD (gastroesophageal reflux disease)     Heartburn    History of kidney stones    Hypertension    Hypothyroidism    Infertility, female    Joint pain    Joint stiffness    Lower extremity edema    Migraine    ovarian cancer    Ovarian cancer, bilateral (HCC) 11/28/2014   PONV (postoperative nausea and vomiting)    Sarcoidosis    Sleep apnea    SOBOE (shortness of breath on exertion)    Thyroid disease    Umbilical hernia    Vitamin D deficiency    woke up while porta cath removed    Past Surgical History:  Procedure Laterality Date   ABDOMINAL HYSTERECTOMY  11/10/2014   at [facility], Exp lap, supracervical hyst, BSO, infracolic omentectomy, lymphadenectomy, aortic lymph node sampling   BIOPSY  11/04/2021   Procedure: BIOPSY;  Surgeon: Lynann Bologna, MD;  Location: [facility];  Service: Gastroenterology;;   BRONCHIAL NEEDLE ASPIRATION BIOPSY  12/16/2020   Procedure: BRONCHIAL NEEDLE ASPIRATION BIOPSIES;  Surgeon: Oretha Milch, MD;  Location: [facility] ENDOSCOPY;  Service: Cardiopulmonary;;   BRONCHIAL WASHINGS  12/16/2020   Procedure: BRONCHIAL WASHINGS;  Surgeon: Oretha Milch, MD;  Location: [facility] ENDOSCOPY;  Service: Cardiopulmonary;;   BUNIONECTOMY Left 09/14/2021   CARPAL TUNNEL RELEASE Left 09/07/2022  Procedure: LEFT CARPAL TUNNEL RELEASE, LEFT THUMB CARPOMETACARPAL INJECTION;  Surgeon: Tarry Kos, MD;  Location: [facility];  Service: Orthopedics;  Laterality: Left;   CARPAL TUNNEL RELEASE Right 10/12/2022   Procedure: RIGHT CARPAL TUNNEL RELEASE, RIGHT THUMB CARPOMETACARPAL INJECTION;  Surgeon: Tarry Kos, MD;  Location: [facility];  Service: Orthopedics;  Laterality: Right;   CHOLECYSTECTOMY N/A 07/24/2018   Procedure: LAPAROSCOPIC CHOLECYSTECTOMY WITH INTRAOPERATIVE CHOLANGIOGRAM;  Surgeon: Darnell Level, MD;  Location: [facility];  Service: General;  Laterality: N/A;   COLONOSCOPY WITH PROPOFOL N/A 11/04/2021   Procedure: COLONOSCOPY WITH PROPOFOL;  Surgeon: Lynann Bologna, MD;  Location: [facility];  Service: Gastroenterology;  Laterality: N/A;   ERCP N/A 07/26/2018   Procedure: ENDOSCOPIC RETROGRADE CHOLANGIOPANCREATOGRAPHY (ERCP);  Surgeon: Meryl Dare, MD;  Location: [facility];  Service: Endoscopy;  Laterality: N/A;   ESOPHAGOGASTRODUODENOSCOPY (EGD) WITH PROPOFOL N/A 11/04/2021   Procedure: ESOPHAGOGASTRODUODENOSCOPY (EGD) WITH PROPOFOL;  Surgeon: Lynann Bologna, MD;  Location: [facility];  Service: Gastroenterology;  Laterality: N/A;   GANGLION CYST EXCISION Right    hand   HYSTERECTOMY ABDOMINAL WITH SALPINGO-OOPHORECTOMY  11/10/2014   ovarian cancer, tumor removal   INCISIONAL HERNIA REPAIR N/A 12/25/2020   Procedure: LAPAROSCOPIC INCISIONAL HERNIA REPAIR WITH MESH;  Surgeon: Kinsinger, De Blanch, MD;  Location: [facility];  Service: General;  Laterality: N/A;   ingrown nail removal     IR GENERIC HISTORICAL  05/12/2016   IR REMOVAL TUN ACCESS W/ PORT W/O FL MOD SED 05/12/2016 [facility]   LAPAROSCOPIC ENDOMETRIOSIS FULGURATION     LIPOMA EXCISION Left    ankle   meniscal tear Right    MENISCUS REPAIR Right    oral sugery     POLYPECTOMY  11/04/2021   Procedure: POLYPECTOMY;  Surgeon: Lynann Bologna, MD;  Location: [facility];  Service: Gastroenterology;;   Shelda Pal a cath removal     PORTA CATH INSERTION     REMOVAL OF STONES  07/26/2018   Procedure: REMOVAL OF STONES;  Surgeon: Meryl Dare, MD;  Location: [facility];  Service: Endoscopy;;   SPHINCTEROTOMY  07/26/2018   Procedure: Dennison Mascot;  Surgeon: Meryl Dare, MD;  Location: [facility];  Service: Endoscopy;;   UMBILICAL HERNIA REPAIR  12/29/2020   VIDEO BRONCHOSCOPY WITH ENDOBRONCHIAL ULTRASOUND N/A 12/16/2020   Procedure: VIDEO BRONCHOSCOPY WITH ENDOBRONCHIAL ULTRASOUND;  Surgeon: Oretha Milch, MD;  Location: [facility];  Service: Cardiopulmonary;  Laterality: N/A;    reports that she has never smoked. She has been exposed to tobacco smoke. She has never used smokeless tobacco.  She reports that she does not currently use alcohol. She reports that she does not use drugs. family history includes Atrial fibrillation in her father; Dementia in her paternal grandfather and paternal grandmother; Diabetes in her maternal aunt and mother; Healthy in her brother; Heart attack in her maternal grandfather; Heart disease in her mother; Heart failure in her maternal grandmother; Hyperlipidemia in her mother; Hypertension in her mother; Osteoarthritis in her mother; Stroke in her father and mother. Allergies  Allergen Reactions   Moxifloxacin Anaphylaxis    needs epinephrine shot   Quinolones Anaphylaxis, Shortness Of Breath and Swelling   Sulfonamide Derivatives Hives, Swelling and Rash    needs epinephrine shot--rash and lip swelling   Doxycycline Nausea And Vomiting   Escitalopram Oxalate Other (See Comments)     fatigue   Lisinopril Cough   Current Outpatient Medications on File Prior to Visit  Medication Sig Dispense Refill  ALPRAZolam (XANAX) 1 MG tablet Take 1 tablet (1 mg total) by mouth 2 (two) times daily as needed for anxiety. 20 tablet 0   amphetamine-dextroamphetamine (ADDERALL XR) 20 MG 24 hr capsule Take 1 capsule (20 mg total) by mouth daily. 30 capsule 0   benzonatate (TESSALON) 100 MG capsule Take by mouth 3 (three) times daily as needed for cough.     Blood Pressure Monitoring (OMRON 3 SERIES BP MONITOR) DEVI Use as directed. 1 each 0   clobetasol cream (TEMOVATE) 0.05 % Apply 1 Application topically 2 (two) times daily as needed. 45 g 0   cyclobenzaprine (FLEXERIL) 5 MG tablet Take 1 tablet (5 mg total) by mouth 2 (two) times daily as needed for muscle spasms. 90 tablet 3   estradiol (ESTRACE) 1 MG tablet Take 1 tablet (1 mg total) by mouth daily. 90 tablet 3   fluticasone-salmeterol (ADVAIR DISKUS) 250-50 MCG/ACT AEPB Inhale 1 puff into the lungs in the morning and at bedtime. 180 each 3   folic acid (FOLVITE) 1 MG tablet Take 2 tablets (2 mg total) by mouth  daily. 180 tablet 3   gabapentin (NEURONTIN) 600 MG tablet Take 1.5 tablets (900 mg total) by mouth 2 (two) times daily. OK to take extra dose as needed 270 tablet 3   HYDROcodone-acetaminophen (NORCO) 5-325 MG tablet Take 1 tablet by mouth 2 (two) times daily as needed. 20 tablet 0   levothyroxine (SYNTHROID) 100 MCG tablet Take 1 tablet (100 mcg total) by mouth daily. 90 tablet 3   losartan (COZAAR) 25 MG tablet Take 1 tablet (25 mg total) by mouth daily. 90 tablet 3   methotrexate 50 MG/2ML injection Inject 0.8 mLs (20 mg total) into the skin once a week. 10 mL 0   Multiple Vitamin (MULTIVITAMIN PO) Take by mouth.     omeprazole (PRILOSEC) 40 MG capsule Take 1 capsule (40 mg total) by mouth daily. 30 capsule 3   Phentermine-Topiramate (QSYMIA) 3.75-23 MG CP24 Take 1 capsule by mouth in the morning. 14 capsule 0   Phentermine-Topiramate (QSYMIA) 7.5-46 MG CP24 Take 1 capsule by mouth in the morning. 30 capsule 0   polyvinyl alcohol (ARTIFICIAL TEARS) 1.4 % ophthalmic solution Place 1 drop into both eyes as needed for dry eyes. 15 mL 0   sertraline (ZOLOFT) 100 MG tablet Take 2 tablets (200 mg total) by mouth daily. 180 tablet 3   traZODone (DESYREL) 50 MG tablet Take 0.5-1 tablets (25-50 mg total) by mouth at bedtime as needed for sleep. 90 tablet 1   Zoster Vaccine Adjuvanted [facility]) injection Inject into the muscle. 0.5 mL 0   No current facility-administered medications on file prior to visit.        ROS:  All others reviewed and negative.  Objective        PE:  BP 126/80 (BP Location: Left Arm, Patient Position: Sitting, Cuff Size: Normal)   Pulse 77   Temp 98.3 F (36.8 C) (Oral)   Ht 5\' 7"  (1.702 m)   Wt 295 lb (133.8 kg)   LMP 11/02/2014   SpO2 99%   BMI 46.20 kg/m                 Constitutional: Pt appears in NAD               HENT: Head: NCAT.                Right Ear: External ear normal.  Left Ear: External ear normal.                Eyes: . Pupils  are equal, round, and reactive to light. Conjunctivae and EOM are normal               Nose: without d/c or deformity               Neck: Neck supple. Gross normal ROM               Cardiovascular: Normal rate and regular rhythm.                 Pulmonary/Chest: Effort normal and breath sounds without rales or wheezing.                Abd:  Soft, NT, ND, + BS, no organomegaly               Neurological: Pt is alert. At baseline orientation, motor grossly intact               Skin: Skin is warm. No rashes, no other new lesions, LE edema - none               Psychiatric: Pt behavior is normal without agitation  but tearful at times, nervous  Micro: none  Cardiac tracings I have personally interpreted today:  none  Pertinent Radiological findings (summarize): none   Lab Results  Component Value Date   WBC 11.3 (H) 11/09/2022   HGB 14.0 11/09/2022   HCT 41.5 11/09/2022   PLT 325 11/09/2022   GLUCOSE 110 (H) 11/09/2022   CHOL 152 06/17/2022   TRIG 108 06/17/2022   HDL 42 (L) 06/17/2022   LDLCALC 90 06/17/2022   ALT 15 11/09/2022   AST 13 11/09/2022   NA 139 11/09/2022   K 4.1 11/09/2022   CL 102 11/09/2022   CREATININE 0.74 11/09/2022   BUN 15 11/09/2022   CO2 29 11/09/2022   TSH 3.36 06/17/2022   INR 0.90 05/12/2016   HGBA1C 5.3 06/17/2022   Assessment/Plan:  [name] is a 52 y.o. White or Caucasian [1] female with  has a past medical history of Anxiety, Arthritis, Asthma, Back pain, Barrett's esophagus, Chemotherapy-induced neuropathy (HCC), Chronic lower back pain, Depression, Dyspnea, Elevated blood pressure, situational (05/05/2016), Endometriosis, Exercise-induced asthma (09/08/2015), Fatigue, Gallbladder problem, GERD (gastroesophageal reflux disease), Heartburn, History of kidney stones, Hypertension, Hypothyroidism, Infertility, female, Joint pain, Joint stiffness, Lower extremity edema, Migraine, ovarian cancer, Ovarian cancer, bilateral (HCC) (11/28/2014), PONV  (postoperative nausea and vomiting), Sarcoidosis, Sleep apnea, SOBOE (shortness of breath on exertion), Thyroid disease, Umbilical hernia, Vitamin D deficiency, and woke up while porta cath removed.  Anxiety Mod situational worsening, for increased wellbutrin xl 300 every day, continue counseling, is currently not at work but may need to d/w HR further about her position, to f/u any worsening symptoms or concerns  HTN (hypertension) BP Readings from Last 3 Encounters:  11/15/22 126/80  11/01/22 116/76  10/12/22 114/75   Stable, pt to continue medical treatment losartan 25 mg qd   Hypothyroidism Lab Results  Component Value Date   TSH 3.36 06/17/2022   Stable, pt to continue levothyroxine 100 qd   Prediabetes Lab Results  Component Value Date   HGBA1C 5.3 06/17/2022   Stable, pt to continue current medical treatment  - diet, wt control   Vitamin D deficiency Last vitamin D Lab Results  Component Value Date   VD25OH 50 06/17/2022  Stable, cont oral replacement   Cough Etiology unclear, for cxr  Followup: Return in about 6 months (around 05/18/2023).  Oliver Barre, MD 11/16/2022 9:05 PM [facility]

## 2022-11-16 ENCOUNTER — Encounter: Payer: Self-pay | Admitting: Internal Medicine

## 2022-11-16 NOTE — Assessment & Plan Note (Signed)
Lab Results  Component Value Date   HGBA1C 5.3 06/17/2022   Stable, pt to continue current medical treatment  - diet, wt control

## 2022-11-16 NOTE — Assessment & Plan Note (Signed)
Lab Results  Component Value Date   TSH 3.36 06/17/2022   Stable, pt to continue levothyroxine 100 qd

## 2022-11-16 NOTE — Assessment & Plan Note (Signed)
Last vitamin D Lab Results  Component Value Date   VD25OH 50 06/17/2022   Stable, cont oral replacement

## 2022-11-16 NOTE — Assessment & Plan Note (Signed)
BP Readings from Last 3 Encounters:  11/15/22 126/80  11/01/22 116/76  10/12/22 114/75   Stable, pt to continue medical treatment losartan 25 mg qd

## 2022-11-16 NOTE — Assessment & Plan Note (Signed)
Mod situational worsening, for increased wellbutrin xl 300 every day, continue counseling, is currently not at work but may need to d/w HR further about her position, to f/u any worsening symptoms or concerns

## 2022-11-16 NOTE — Assessment & Plan Note (Signed)
Etiology unclear, for cxr 

## 2022-11-17 ENCOUNTER — Other Ambulatory Visit (HOSPITAL_BASED_OUTPATIENT_CLINIC_OR_DEPARTMENT_OTHER): Payer: Self-pay

## 2022-11-17 ENCOUNTER — Other Ambulatory Visit: Payer: Self-pay | Admitting: Physician Assistant

## 2022-11-17 MED ORDER — METHOTREXATE SODIUM CHEMO INJECTION 50 MG/2ML
20.0000 mg | INTRAMUSCULAR | 0 refills | Status: DC
  Filled 2022-11-17: qty 10, 84d supply, fill #0

## 2022-11-17 NOTE — Telephone Encounter (Signed)
Last Fill: 08/15/2022  Labs: 11/08/2021 WBC count is borderline elevated-11.3 and absolute neutrophils are elevated. Glucose is 110. Rest of CMP WNL.   Next Visit: 11/29/2022  Last Visit: 06/28/2022  DX: Sarcoidosis   Current Dose per office note 06/28/2022: methotrexate 0.8 mL subcu injections once weekly   Okay to refill Methotrexate?

## 2022-11-18 ENCOUNTER — Other Ambulatory Visit (HOSPITAL_BASED_OUTPATIENT_CLINIC_OR_DEPARTMENT_OTHER): Payer: Self-pay

## 2022-11-18 ENCOUNTER — Telehealth: Payer: Self-pay | Admitting: Orthopaedic Surgery

## 2022-11-18 ENCOUNTER — Encounter: Payer: Self-pay | Admitting: Orthopaedic Surgery

## 2022-11-18 NOTE — Telephone Encounter (Signed)
Please send her to OT for s/p CTR.  Thanks.

## 2022-11-18 NOTE — Telephone Encounter (Signed)
[employer] forms received. To [employer]. 

## 2022-11-21 ENCOUNTER — Other Ambulatory Visit: Payer: Self-pay

## 2022-11-21 DIAGNOSIS — Z9889 Other specified postprocedural states: Secondary | ICD-10-CM

## 2022-11-22 ENCOUNTER — Encounter: Payer: Self-pay | Admitting: Dermatology

## 2022-11-22 ENCOUNTER — Ambulatory Visit (INDEPENDENT_AMBULATORY_CARE_PROVIDER_SITE_OTHER): Payer: Commercial Managed Care - PPO | Admitting: Dermatology

## 2022-11-22 VITALS — BP 122/72 | HR 74

## 2022-11-22 DIAGNOSIS — D1801 Hemangioma of skin and subcutaneous tissue: Secondary | ICD-10-CM | POA: Diagnosis not present

## 2022-11-22 DIAGNOSIS — L821 Other seborrheic keratosis: Secondary | ICD-10-CM

## 2022-11-22 DIAGNOSIS — L814 Other melanin hyperpigmentation: Secondary | ICD-10-CM

## 2022-11-22 DIAGNOSIS — B079 Viral wart, unspecified: Secondary | ICD-10-CM

## 2022-11-22 DIAGNOSIS — L309 Dermatitis, unspecified: Secondary | ICD-10-CM

## 2022-11-22 DIAGNOSIS — L578 Other skin changes due to chronic exposure to nonionizing radiation: Secondary | ICD-10-CM | POA: Diagnosis not present

## 2022-11-22 DIAGNOSIS — Z1283 Encounter for screening for malignant neoplasm of skin: Secondary | ICD-10-CM | POA: Diagnosis not present

## 2022-11-22 DIAGNOSIS — D229 Melanocytic nevi, unspecified: Secondary | ICD-10-CM

## 2022-11-22 DIAGNOSIS — W908XXA Exposure to other nonionizing radiation, initial encounter: Secondary | ICD-10-CM

## 2022-11-22 NOTE — Progress Notes (Signed)
   New Patient Visit   Subjective  [name] is a 52 y.o. female who presents for the following: Skin Cancer Screening and Full Body Skin Exam  The patient presents for Total-Body Skin Exam (TBSE) for skin cancer screening and mole check. The patient has spots, moles and lesions to be evaluated, some may be new or changing and the patient may have concern these could be cancer.  Patient states she has spots located on the right leg that she would like to have examined. Patient reports the areas have been there for a while. She reports the areas are not bothersome.  Patient reports she has not had these areas evaluated before. Patient no Hx of skin cancer. Patient has no  family history of skin cancer.  The following portions of the chart were reviewed this encounter and updated as appropriate: medications, allergies, medical history  Review of Systems:  No other skin or systemic complaints except as noted in HPI or Assessment and Plan.  Objective  Well appearing patient in no apparent distress; mood and affect are within normal limits.  A full examination was performed including scalp, head, eyes, ears, nose, lips, neck, chest, axillae, abdomen, back, buttocks, bilateral upper extremities, bilateral lower extremities, hands, feet, fingers, toes, fingernails, and toenails. All findings within normal limits unless otherwise noted below.   Relevant physical exam findings are noted in the Assessment and Plan.   Hands Exam: Scaly pink papules coalescing to plaques  Assessment & Plan   SKIN CANCER SCREENING PERFORMED TODAY.  ACTINIC DAMAGE - Chronic condition, secondary to cumulative UV/sun exposure - diffuse scaly erythematous macules with underlying dyspigmentation - Recommend daily broad spectrum sunscreen SPF 30+ to sun-exposed areas, reapply every 2 hours as needed.  - Staying in the shade or wearing long sleeves, sun glasses (UVA+UVB protection) and wide brim hats (4-inch brim  around the entire circumference of the hat) are also recommended for sun protection.  - Call for new or changing lesions.  LENTIGINES,  SEBORRHEIC KERATOSES legs HEMANGIOMAS - Benign normal skin lesions - Benign-appearing - Call for any changes  MELANOCYTIC NEVI - Tan-brown and/or pink-flesh-colored symmetric macules and papules - Benign appearing on exam today - Observation - Call clinic for new or changing moles - Recommend daily use of broad spectrum spf 30+ sunscreen to sun-exposed areas.    Hand Dermatitis - Assessment: Patient has hand dermatitis, with sensitivity noted to cleaning products. - Plan: Apply Clobetasol Cream BID for up to 2 weeks, then stop.  Advise the patient to identify and avoid known triggers to prevent recurrence.    Viral warts, unspecified type Bottom of right foot  Destruction of lesion - Bottom of right foot Complexity: simple   Destruction method: cryotherapy   Informed consent: discussed and consent obtained   Timeout:  patient name, date of birth, surgical site, and procedure verified Lesion destroyed using liquid nitrogen: Yes   Region frozen until ice ball extended beyond lesion: Yes   Outcome: patient tolerated procedure well with no complications   Post-procedure details: wound care instructions given      Return in about 1 year (around 11/22/2023) for TBSE.  Owens Shark, CMA, am acting as scribe for [employer], DO.   Documentation: I have reviewed the above documentation for accuracy and completeness, and I agree with the above.  Langston Reusing, DO

## 2022-11-22 NOTE — Patient Instructions (Addendum)
Thank you for visiting [place] today. We appreciate your proactive approach to maintaining your skin health and addressing your concerns.  Here is a summary of the key points from today's consultation:  - Skin Examination: No signs of skin cancer were observed. The spots on your legs are seborrheic keratosis, which are benign.  - Hand Dermatitis: Continue using clobetasol for the issue on your toe.  - Sun Protection: It is crucial to use sunscreen daily, especially on your face, neck, chest, and arms. We have provided samples of different sunscreens for you to try.  - Moisturizing: Apply moisturizer right after you shower and towel dry, focusing on areas of dry skin.  - Autoimmune Management: Continue management of sarcoidosis with Dr. Cathi Roan and methotrexate treatment.  - Allergy Management: Taking antihistamines daily during the summer can help manage your skin issues related to seasonal allergies.  - Annual Skin Cancer Screenings: Given your history of sun exposure, continue to have yearly skin cancer screenings.  - Self-Monitoring: Keep an eye on your skin for any new changes, especially on your chest, arms, and face. Look for pimples that do not heal.  Please feel free to reach out if you have any further questions or concerns. We look forward to seeing you at your next annual screening.     Dermatitis  Treatment Plan: -Continue Clobetasol as needed for no more than 2 wks at a time  -Take antihistamine over the counter daily   Cryotherapy Aftercare  Wash gently with soap and water everyday.   Apply Vaseline and Band-Aid daily until healed.   Due to recent changes in healthcare laws, you may see results of your pathology and/or laboratory studies on MyChart before the doctors have had a chance to review them. We understand that in some cases there may be results that are confusing or concerning to you. Please understand that not all results are received at the same time and often  the doctors may need to interpret multiple results in order to provide you with the best plan of care or course of treatment. Therefore, we ask that you please give [place] 2 business days to thoroughly review all your results before contacting the office for clarification. Should we see a critical lab result, you will be contacted sooner.   If You Need Anything After Your Visit  If you have any questions or concerns for your doctor, please call our main line at [phone] If no one answers, please leave a voicemail as directed and we will return your call as soon as possible. Messages left after 4 pm will be answered the following business day.   You may also send [place] a message via MyChart. We typically respond to MyChart messages within 1-2 business days.  For prescription refills, please ask your pharmacy to contact our office. Our fax number is [phone].  If you have an urgent issue when the clinic is closed that cannot wait until the next business day, you can page your doctor at the number below.    Please note that while we do our best to be available for urgent issues outside of office hours, we are not available 24/7.   If you have an urgent issue and are unable to reach [place], you may choose to seek medical care at your doctor's office, retail clinic, urgent care center, or emergency room.  If you have a medical emergency, please immediately call 911 or go to the emergency department. In the event of inclement weather, please  call our main line at [phone] for an update on the status of any delays or closures.  Dermatology Medication Tips: Please keep the boxes that topical medications come in in order to help keep track of the instructions about where and how to use these. Pharmacies typically print the medication instructions only on the boxes and not directly on the medication tubes.   If your medication is too expensive, please contact our office at [phone] or send [place] a  message through MyChart.   We are unable to tell what your co-pay for medications will be in advance as this is different depending on your insurance coverage. However, we may be able to find a substitute medication at lower cost or fill out paperwork to get insurance to cover a needed medication.   If a prior authorization is required to get your medication covered by your insurance company, please allow [place] 1-2 business days to complete this process.  Drug prices often vary depending on where the prescription is filled and some pharmacies may offer cheaper prices.  The website www.goodrx.com contains coupons for medications through different pharmacies. The prices here do not account for what the cost may be with help from insurance (it may be cheaper with your insurance), but the website can give you the price if you did not use any insurance.  - You can print the associated coupon and take it with your prescription to the pharmacy.  - You may also stop by our office during regular business hours and pick up a GoodRx coupon card.  - If you need your prescription sent electronically to a different pharmacy, notify our office through [facility] or by phone at [phone]

## 2022-11-24 NOTE — Progress Notes (Unsigned)
Post-Op Visit Note   Patient: [name]           Date of Birth: 1970-11-26           MRN: [id number] Visit Date: 11/25/2022 PCP: Corwin Levins, MD   Assessment & Plan:  Chief Complaint: No chief complaint on file.  Visit Diagnoses: No diagnosis found.  Plan: ***  Follow-Up Instructions: No follow-ups on file.   Orders:  No orders of the defined types were placed in this encounter.  No orders of the defined types were placed in this encounter.   Imaging: No results found.  PMFS History: Patient Active Problem List   Diagnosis Date Noted   Carpal tunnel syndrome on right 10/12/2022   Ganglion cyst of flexor tendon sheath of finger of right hand 10/12/2022   Primary osteoarthritis of first carpometacarpal joint of right hand 10/12/2022   Adjustment disorder with mixed anxiety and depressed mood 09/12/2022   Carpal tunnel syndrome on left 09/07/2022   Eating disorder 08/11/2022   Blurry vision, bilateral 06/28/2022   Primary osteoarthritis of first carpometacarpal joint of left hand 06/28/2022   Hoarseness 06/18/2022   Insulin resistance 05/18/2022   Major depressive disorder 03/09/2022   Polyphagia 03/09/2022   Left foot pain 02/16/2022   Osteoarthritis of both knees 01/13/2022   Hot flashes due to menopause 12/19/2021   ADD (attention deficit disorder) 12/19/2021   SOBOE (shortness of breath on exertion) 11/16/2021   Elevated glucose 11/16/2021   Vitamin D deficiency 11/16/2021   Prediabetes 11/16/2021   Bunion, left 08/06/2021   Morbid obesity (HCC) with initial BMI 52 06/26/2021   Venous insufficiency 06/25/2021   Incisional hernia 12/25/2020   Mediastinal lymphadenopathy    Pulmonary nodules 10/20/2020   Multiple skin nodules 07/24/2020   Right lumbar radiculopathy 05/18/2020   Periumbilical hernia 05/18/2020   TB lung, latent 05/18/2020   Acute non-recurrent maxillary sinusitis 11/09/2019   HTN (hypertension) 04/14/2019   Mass of left wrist  04/14/2019   External otitis of right ear 04/14/2019   Choledocholithiasis    Abnormal cholangiogram    Cholecystitis with cholelithiasis 07/24/2018   Hot flash not due to menopause 10/16/2017   Eczema 10/16/2017   Flu-like symptoms 06/20/2017   Cognitive changes 01/30/2017   Disturbed concentration 01/26/2017   Left shoulder pain 11/01/2016   Right low back pain 11/01/2016   Elevated blood pressure, situational 05/05/2016   Surgical menopause 01/29/2016   OSA on CPAP 12/22/2015   Cough 11/26/2015   Capsulitis 10/06/2015   Ganglion cyst of left foot 10/06/2015   Fatigue 09/13/2015   Asthma 09/08/2015   Exertional dyspnea 09/08/2015   Peroneal ganglion cyst 07/20/2015   Low back pain 07/20/2015   Peroneal tendinitis of left lower leg 06/25/2015   Nonallopathic lesion of cervical region 06/25/2015   Wheezing 05/08/2015   Polyarthralgia 05/08/2015   Peripheral edema 03/16/2015   Central line complication 03/16/2015   Chemotherapy induced neutropenia (HCC) 02/28/2015   Screening for colorectal cancer 01/05/2015   Chemotherapy induced nausea and vomiting 01/02/2015   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   Premature surgical menopause 12/17/2014   Leukopenia due to antineoplastic chemotherapy (HCC) 12/17/2014   Port-A-Cath in place 12/17/2014   Encounter for antineoplastic chemotherapy 12/17/2014   Myalgia 12/09/2014   Hypersensitivity reaction 12/05/2014   Poor venous access 11/28/2014   Postoperative cellulitis of surgical wound 11/24/2014   Malignant neoplasm of both ovaries 11/10/2014   Anxiety 11/10/2014   Adnexal mass 11/10/2014  Encounter for well adult exam with abnormal findings 09/10/2014   Hypothyroidism 09/10/2014   Hypersomnolence 09/10/2014   Acute non-recurrent frontal sinusitis 06/24/2014   Right knee pain 06/24/2014   Lower back pain 01/08/2014   EAR PAIN, BILATERAL 12/18/2009   KNEE PAIN, RIGHT 12/18/2009   INGROWN TOENAIL  12/17/2008   BACK PAIN 12/17/2008   Acute bronchospasm 11/11/2008   Depression 11/29/2007   Insomnia 11/29/2007   Generalized anxiety disorder 04/13/2007   Allergic rhinitis 04/13/2007   GERD 04/13/2007   Endometriosis determined by laparoscopy 04/13/2007   Migraine headache 01/29/2007   Past Medical History:  Diagnosis Date   Anxiety    Arthritis    Asthma    Back pain    Barrett's esophagus    Chemotherapy-induced neuropathy (HCC)    Chronic lower back pain    Depression    Dyspnea    Elevated blood pressure, situational 05/05/2016   Endometriosis    Exercise-induced asthma 09/08/2015   Fatigue    Gallbladder problem    GERD (gastroesophageal reflux disease)    Heartburn    History of kidney stones    Hypertension    Hypothyroidism    Infertility, female    Joint pain    Joint stiffness    Lower extremity edema    Migraine    ovarian cancer    Ovarian cancer, bilateral (HCC) 11/28/2014   PONV (postoperative nausea and vomiting)    Sarcoidosis    Sleep apnea    SOBOE (shortness of breath on exertion)    Thyroid disease    Umbilical hernia    Vitamin D deficiency    woke up while porta cath removed     Family History  Problem Relation Age of Onset   Hyperlipidemia Mother    Hypertension Mother    Diabetes Mother    Stroke Mother        Deceased   Heart disease Mother    Osteoarthritis Mother    Atrial fibrillation Father        Living   Stroke Father        optic nerve left eye   Healthy Brother    Heart failure Maternal Grandmother    Heart attack Maternal Grandfather    Dementia Paternal Grandmother    Dementia Paternal Grandfather    Diabetes Maternal Aunt     Past Surgical History:  Procedure Laterality Date   ABDOMINAL HYSTERECTOMY  11/10/2014   at [facility], Exp lap, supracervical hyst, BSO, infracolic omentectomy, lymphadenectomy, aortic lymph node sampling   BIOPSY  11/04/2021   Procedure: BIOPSY;  Surgeon: Lynann Bologna, MD;  Location: [facility];  Service: Gastroenterology;;   BRONCHIAL NEEDLE ASPIRATION BIOPSY  12/16/2020   Procedure: BRONCHIAL NEEDLE ASPIRATION BIOPSIES;  Surgeon: Oretha Milch, MD;  Location: [facility] ENDOSCOPY;  Service: Cardiopulmonary;;   BRONCHIAL WASHINGS  12/16/2020   Procedure: BRONCHIAL WASHINGS;  Surgeon: Oretha Milch, MD;  Location: [facility] ENDOSCOPY;  Service: Cardiopulmonary;;   BUNIONECTOMY Left 09/14/2021   CARPAL TUNNEL RELEASE Left 09/07/2022   Procedure: LEFT CARPAL TUNNEL RELEASE, LEFT THUMB CARPOMETACARPAL INJECTION;  Surgeon: Tarry Kos, MD;  Location: [facility];  Service: Orthopedics;  Laterality: Left;   CARPAL TUNNEL RELEASE Right 10/12/2022   Procedure: RIGHT CARPAL TUNNEL RELEASE, RIGHT THUMB CARPOMETACARPAL INJECTION;  Surgeon: Tarry Kos, MD;  Location: [facility];  Service: Orthopedics;  Laterality: Right;   CHOLECYSTECTOMY N/A 07/24/2018   Procedure: LAPAROSCOPIC CHOLECYSTECTOMY WITH INTRAOPERATIVE CHOLANGIOGRAM;  Surgeon: Darnell Level, MD;  Location: [facility];  Service: General;  Laterality: N/A;   COLONOSCOPY WITH PROPOFOL N/A 11/04/2021   Procedure: COLONOSCOPY WITH PROPOFOL;  Surgeon: Lynann Bologna, MD;  Location: [facility];  Service: Gastroenterology;  Laterality: N/A;   ERCP N/A 07/26/2018   Procedure: ENDOSCOPIC RETROGRADE CHOLANGIOPANCREATOGRAPHY (ERCP);  Surgeon: Meryl Dare, MD;  Location: [facility];  Service: Endoscopy;  Laterality: N/A;   ESOPHAGOGASTRODUODENOSCOPY (EGD) WITH PROPOFOL N/A 11/04/2021   Procedure: ESOPHAGOGASTRODUODENOSCOPY (EGD) WITH PROPOFOL;  Surgeon: Lynann Bologna, MD;  Location: [facility];  Service: Gastroenterology;  Laterality: N/A;   GANGLION CYST EXCISION Right    hand   HYSTERECTOMY ABDOMINAL WITH SALPINGO-OOPHORECTOMY  11/10/2014   ovarian cancer, tumor removal   INCISIONAL HERNIA REPAIR N/A 12/25/2020   Procedure: LAPAROSCOPIC INCISIONAL HERNIA REPAIR WITH MESH;  Surgeon: Kinsinger, De Blanch, MD;   Location: [facility];  Service: General;  Laterality: N/A;   ingrown nail removal     IR GENERIC HISTORICAL  05/12/2016   IR REMOVAL TUN ACCESS W/ PORT W/O FL MOD SED 05/12/2016 [facility]   LAPAROSCOPIC ENDOMETRIOSIS FULGURATION     LIPOMA EXCISION Left    ankle   meniscal tear Right    MENISCUS REPAIR Right    oral sugery     POLYPECTOMY  11/04/2021   Procedure: POLYPECTOMY;  Surgeon: Lynann Bologna, MD;  Location: [facility];  Service: Gastroenterology;;   Shelda Pal a cath removal     PORTA CATH INSERTION     REMOVAL OF STONES  07/26/2018   Procedure: REMOVAL OF STONES;  Surgeon: Meryl Dare, MD;  Location: [facility];  Service: Endoscopy;;   SPHINCTEROTOMY  07/26/2018   Procedure: Dennison Mascot;  Surgeon: Meryl Dare, MD;  Location: [facility];  Service: Endoscopy;;   UMBILICAL HERNIA REPAIR  12/29/2020   VIDEO BRONCHOSCOPY WITH ENDOBRONCHIAL ULTRASOUND N/A 12/16/2020   Procedure: VIDEO BRONCHOSCOPY WITH ENDOBRONCHIAL ULTRASOUND;  Surgeon: Oretha Milch, MD;  Location: [facility] ENDOSCOPY;  Service: Cardiopulmonary;  Laterality: N/A;   Social History   Occupational History   Occupation: Educational psychologist:   Tobacco Use   Smoking status: Never    Passive exposure: Past   Smokeless tobacco: Never  Vaping Use   Vaping status: Never Used  Substance and Sexual Activity   Alcohol use: Not Currently   Drug use: No   Sexual activity: Not on file

## 2022-11-25 ENCOUNTER — Ambulatory Visit (INDEPENDENT_AMBULATORY_CARE_PROVIDER_SITE_OTHER): Payer: Commercial Managed Care - PPO | Admitting: Orthopaedic Surgery

## 2022-11-25 DIAGNOSIS — Z9889 Other specified postprocedural states: Secondary | ICD-10-CM

## 2022-11-25 DIAGNOSIS — G5601 Carpal tunnel syndrome, right upper limb: Secondary | ICD-10-CM

## 2022-11-28 ENCOUNTER — Ambulatory Visit: Payer: Commercial Managed Care - PPO | Admitting: Licensed Clinical Social Worker

## 2022-11-28 NOTE — Progress Notes (Deleted)
[facility] Counselor/Therapist Progress Note  Patient ID: [name], MRN: [id number]    Date: 11/28/22  Time Spent: ***  {LBBHAMPM:26719} - *** {LBBHAMPM:26719} : *** Minutes  Treatment Type: Individual Therapy.  Reported Symptoms: ***  Mental Status Exam: Appearance:  {PSY:22683}     Behavior: {PSY:21022743}  Motor: {PSY:22302}  Speech/Language:  {PSY:22685}  Affect: {PSY:22687}  Mood: {PSY:31886}  Thought process: {PSY:31888}  Thought content:   {PSY:7123103720}  Sensory/Perceptual disturbances:   {[phone]}  Orientation: {PSY:30297}  Attention: {PSY:22877}  Concentration: {PSY:203-577-2405}  Memory: {[phone]}  Fund of knowledge:  {PSY:203-577-2405}  Insight:   {PSY:203-577-2405}  Judgment:  {PSY:203-577-2405}  Impulse Control: {PSY:203-577-2405}   Risk Assessment: Danger to Self:  {PSY:22692} Self-injurious Behavior: {PSY:22692} Danger to Others: {PSY:22692} Duty to Warn:{PSY:311194} Physical Aggression / Violence:{PSY:21197} Access to Firearms a concern: {PSY:21197} Gang Involvement:{PSY:21197}  Subjective:   [name] participated from {Patient Location:26[address]::"home"}, via {LBBHVIDEOORPHONE:26720}, and consented to treatment. Therapist participated from {LBBHPROVIDERLOCATION:26721}. We met online due to COVID pandemic.   ***   Interventions: {PSY:[phone]}  Diagnosis: No diagnosis found.   Plan: ***Patient is to use CBT, mindfulness and coping skills to help manage decrease symptoms associated with their diagnosis.   Long-term goal:   ***Reduce overall level, frequency, and intensity of the feelings of depression, anxiety and panic evidenced by       decreased irritability, negative self talk, and helpless feelings from 6 to 7 days/week to 0 to 1 days/week per client report for at least 3 consecutive months.  Short-term goal:  ***Verbally express understanding of the relationship between feelings of depression, anxiety and their  impact on thinking patterns and behaviors. Verbalize an understanding of the role that distorted thinking plays in creating fears, excessive worry, and ruminations.  Phyllis Ginger MSW, LCSW/DATE

## 2022-11-29 ENCOUNTER — Ambulatory Visit: Payer: Commercial Managed Care - PPO | Attending: Physician Assistant | Admitting: Physician Assistant

## 2022-11-29 ENCOUNTER — Encounter: Payer: Self-pay | Admitting: Physician Assistant

## 2022-11-29 ENCOUNTER — Other Ambulatory Visit (HOSPITAL_BASED_OUTPATIENT_CLINIC_OR_DEPARTMENT_OTHER): Payer: Self-pay

## 2022-11-29 VITALS — BP 135/86 | HR 90 | Resp 18 | Ht 67.0 in | Wt 301.6 lb

## 2022-11-29 DIAGNOSIS — G43001 Migraine without aura, not intractable, with status migrainosus: Secondary | ICD-10-CM

## 2022-11-29 DIAGNOSIS — Z227 Latent tuberculosis: Secondary | ICD-10-CM

## 2022-11-29 DIAGNOSIS — R768 Other specified abnormal immunological findings in serum: Secondary | ICD-10-CM | POA: Diagnosis not present

## 2022-11-29 DIAGNOSIS — K115 Sialolithiasis: Secondary | ICD-10-CM

## 2022-11-29 DIAGNOSIS — G4733 Obstructive sleep apnea (adult) (pediatric): Secondary | ICD-10-CM

## 2022-11-29 DIAGNOSIS — M5416 Radiculopathy, lumbar region: Secondary | ICD-10-CM

## 2022-11-29 DIAGNOSIS — M170 Bilateral primary osteoarthritis of knee: Secondary | ICD-10-CM | POA: Diagnosis not present

## 2022-11-29 DIAGNOSIS — C563 Malignant neoplasm of bilateral ovaries: Secondary | ICD-10-CM | POA: Diagnosis not present

## 2022-11-29 DIAGNOSIS — E039 Hypothyroidism, unspecified: Secondary | ICD-10-CM

## 2022-11-29 DIAGNOSIS — T451X5A Adverse effect of antineoplastic and immunosuppressive drugs, initial encounter: Secondary | ICD-10-CM

## 2022-11-29 DIAGNOSIS — I10 Essential (primary) hypertension: Secondary | ICD-10-CM

## 2022-11-29 DIAGNOSIS — G5603 Carpal tunnel syndrome, bilateral upper limbs: Secondary | ICD-10-CM

## 2022-11-29 DIAGNOSIS — J4521 Mild intermittent asthma with (acute) exacerbation: Secondary | ICD-10-CM

## 2022-11-29 DIAGNOSIS — Z79899 Other long term (current) drug therapy: Secondary | ICD-10-CM | POA: Diagnosis not present

## 2022-11-29 DIAGNOSIS — R918 Other nonspecific abnormal finding of lung field: Secondary | ICD-10-CM

## 2022-11-29 DIAGNOSIS — M19041 Primary osteoarthritis, right hand: Secondary | ICD-10-CM | POA: Diagnosis not present

## 2022-11-29 DIAGNOSIS — R5383 Other fatigue: Secondary | ICD-10-CM

## 2022-11-29 DIAGNOSIS — G620 Drug-induced polyneuropathy: Secondary | ICD-10-CM

## 2022-11-29 DIAGNOSIS — Z8719 Personal history of other diseases of the digestive system: Secondary | ICD-10-CM

## 2022-11-29 DIAGNOSIS — D869 Sarcoidosis, unspecified: Secondary | ICD-10-CM | POA: Diagnosis not present

## 2022-11-29 DIAGNOSIS — E8940 Asymptomatic postprocedural ovarian failure: Secondary | ICD-10-CM

## 2022-11-29 DIAGNOSIS — Z9889 Other specified postprocedural states: Secondary | ICD-10-CM

## 2022-11-29 DIAGNOSIS — H538 Other visual disturbances: Secondary | ICD-10-CM

## 2022-11-29 DIAGNOSIS — M19042 Primary osteoarthritis, left hand: Secondary | ICD-10-CM

## 2022-11-29 MED ORDER — CLOBETASOL PROPIONATE 0.05 % EX CREA
1.0000 | TOPICAL_CREAM | Freq: Two times a day (BID) | CUTANEOUS | 0 refills | Status: DC | PRN
  Filled 2022-11-29: qty 45, 45d supply, fill #0

## 2022-11-29 MED ORDER — PREDNISONE 5 MG PO TABS
ORAL_TABLET | ORAL | 0 refills | Status: DC
Start: 2022-11-29 — End: 2023-03-08
  Filled 2022-11-29: qty 40, 16d supply, fill #0

## 2022-11-29 NOTE — Therapy (Signed)
OUTPATIENT OCCUPATIONAL THERAPY ORTHO EVALUATION  Patient Name: [name] MRN: [id number] DOB:1971-04-26, 52 y.o., female Today's Date: 12/01/2022  PCP: Oliver Barre, MD REFERRING PROVIDER:  Tarry Kos, MD    END OF SESSION:  OT End of Session - 12/01/22 1522     Visit Number 1    Number of Visits 12    Date for OT Re-Evaluation 01/13/23    Authorization Type [employer]    OT Start Time 1523    OT Stop Time 1613    OT Time Calculation (min) 50 min    Activity Tolerance Patient tolerated treatment well;No increased pain;Patient limited by fatigue;Patient limited by pain    Behavior During Therapy [facility] for tasks assessed/performed             Past Medical History:  Diagnosis Date   Anxiety    Arthritis    Asthma    Back pain    Barrett's esophagus    Chemotherapy-induced neuropathy (HCC)    Chronic lower back pain    Depression    Dyspnea    Elevated blood pressure, situational 05/05/2016   Endometriosis    Exercise-induced asthma 09/08/2015   Fatigue    Gallbladder problem    GERD (gastroesophageal reflux disease)    Heartburn    History of kidney stones    Hypertension    Hypothyroidism    Infertility, female    Joint pain    Joint stiffness    Lower extremity edema    Migraine    ovarian cancer    Ovarian cancer, bilateral (HCC) 11/28/2014   PONV (postoperative nausea and vomiting)    Sarcoidosis    Sleep apnea    SOBOE (shortness of breath on exertion)    Thyroid disease    Umbilical hernia    Vitamin D deficiency    woke up while porta cath removed    Past Surgical History:  Procedure Laterality Date   ABDOMINAL HYSTERECTOMY  11/10/2014   at [facility], Exp lap, supracervical hyst, BSO, infracolic omentectomy, lymphadenectomy, aortic lymph node sampling   BIOPSY  11/04/2021   Procedure: BIOPSY;  Surgeon: Lynann Bologna, MD;  Location: [facility];  Service: Gastroenterology;;   BRONCHIAL NEEDLE ASPIRATION BIOPSY  12/16/2020    Procedure: BRONCHIAL NEEDLE ASPIRATION BIOPSIES;  Surgeon: Oretha Milch, MD;  Location: [facility] ENDOSCOPY;  Service: Cardiopulmonary;;   BRONCHIAL WASHINGS  12/16/2020   Procedure: BRONCHIAL WASHINGS;  Surgeon: Oretha Milch, MD;  Location: [facility] ENDOSCOPY;  Service: Cardiopulmonary;;   BUNIONECTOMY Left 09/14/2021   CARPAL TUNNEL RELEASE Left 09/07/2022   Procedure: LEFT CARPAL TUNNEL RELEASE, LEFT THUMB CARPOMETACARPAL INJECTION;  Surgeon: Tarry Kos, MD;  Location: [facility];  Service: Orthopedics;  Laterality: Left;   CARPAL TUNNEL RELEASE Right 10/12/2022   Procedure: RIGHT CARPAL TUNNEL RELEASE, RIGHT THUMB CARPOMETACARPAL INJECTION;  Surgeon: Tarry Kos, MD;  Location: [facility];  Service: Orthopedics;  Laterality: Right;   CHOLECYSTECTOMY N/A 07/24/2018   Procedure: LAPAROSCOPIC CHOLECYSTECTOMY WITH INTRAOPERATIVE CHOLANGIOGRAM;  Surgeon: Darnell Level, MD;  Location: [facility];  Service: General;  Laterality: N/A;   COLONOSCOPY WITH PROPOFOL N/A 11/04/2021   Procedure: COLONOSCOPY WITH PROPOFOL;  Surgeon: Lynann Bologna, MD;  Location: [facility];  Service: Gastroenterology;  Laterality: N/A;   ERCP N/A 07/26/2018   Procedure: ENDOSCOPIC RETROGRADE CHOLANGIOPANCREATOGRAPHY (ERCP);  Surgeon: Meryl Dare, MD;  Location: [facility];  Service: Endoscopy;  Laterality: N/A;   ESOPHAGOGASTRODUODENOSCOPY (EGD) WITH PROPOFOL N/A 11/04/2021  Procedure: ESOPHAGOGASTRODUODENOSCOPY (EGD) WITH PROPOFOL;  Surgeon: Lynann Bologna, MD;  Location: [facility];  Service: Gastroenterology;  Laterality: N/A;   GANGLION CYST EXCISION Right    hand   HYSTERECTOMY ABDOMINAL WITH SALPINGO-OOPHORECTOMY  11/10/2014   ovarian cancer, tumor removal   INCISIONAL HERNIA REPAIR N/A 12/25/2020   Procedure: LAPAROSCOPIC INCISIONAL HERNIA REPAIR WITH MESH;  Surgeon: Kinsinger, De Blanch, MD;  Location: [facility];  Service: General;  Laterality: N/A;   ingrown nail removal     IR GENERIC  HISTORICAL  05/12/2016   IR REMOVAL TUN ACCESS W/ PORT W/O FL MOD SED 05/12/2016 [facility]   LAPAROSCOPIC ENDOMETRIOSIS FULGURATION     LIPOMA EXCISION Left    ankle   meniscal tear Right    MENISCUS REPAIR Right    oral sugery     POLYPECTOMY  11/04/2021   Procedure: POLYPECTOMY;  Surgeon: Lynann Bologna, MD;  Location: [facility];  Service: Gastroenterology;;   Shelda Pal a cath removal     PORTA CATH INSERTION     REMOVAL OF STONES  07/26/2018   Procedure: REMOVAL OF STONES;  Surgeon: Meryl Dare, MD;  Location: [facility];  Service: Endoscopy;;   SPHINCTEROTOMY  07/26/2018   Procedure: Dennison Mascot;  Surgeon: Meryl Dare, MD;  Location: [facility];  Service: Endoscopy;;   UMBILICAL HERNIA REPAIR  12/29/2020   VIDEO BRONCHOSCOPY WITH ENDOBRONCHIAL ULTRASOUND N/A 12/16/2020   Procedure: VIDEO BRONCHOSCOPY WITH ENDOBRONCHIAL ULTRASOUND;  Surgeon: Oretha Milch, MD;  Location: [facility];  Service: Cardiopulmonary;  Laterality: N/A;   Patient Active Problem List   Diagnosis Date Noted   Carpal tunnel syndrome on right 10/12/2022   Ganglion cyst of flexor tendon sheath of finger of right hand 10/12/2022   Primary osteoarthritis of first carpometacarpal joint of right hand 10/12/2022   Adjustment disorder with mixed anxiety and depressed mood 09/12/2022   Carpal tunnel syndrome on left 09/07/2022   Eating disorder 08/11/2022   Blurry vision, bilateral 06/28/2022   Primary osteoarthritis of first carpometacarpal joint of left hand 06/28/2022   Hoarseness 06/18/2022   Insulin resistance 05/18/2022   Major depressive disorder 03/09/2022   Polyphagia 03/09/2022   Left foot pain 02/16/2022   Osteoarthritis of both knees 01/13/2022   Hot flashes due to menopause 12/19/2021   ADD (attention deficit disorder) 12/19/2021   SOBOE (shortness of breath on exertion) 11/16/2021   Elevated glucose 11/16/2021   Vitamin D deficiency 11/16/2021   Prediabetes 11/16/2021    Bunion, left 08/06/2021   Morbid obesity (HCC) with initial BMI 52 06/26/2021   Venous insufficiency 06/25/2021   Incisional hernia 12/25/2020   Mediastinal lymphadenopathy    Pulmonary nodules 10/20/2020   Multiple skin nodules 07/24/2020   Right lumbar radiculopathy 05/18/2020   Periumbilical hernia 05/18/2020   TB lung, latent 05/18/2020   Acute non-recurrent maxillary sinusitis 11/09/2019   HTN (hypertension) 04/14/2019   Mass of left wrist 04/14/2019   External otitis of right ear 04/14/2019   Choledocholithiasis    Abnormal cholangiogram    Cholecystitis with cholelithiasis 07/24/2018   Hot flash not due to menopause 10/16/2017   Eczema 10/16/2017   Flu-like symptoms 06/20/2017   Cognitive changes 01/30/2017   Disturbed concentration 01/26/2017   Left shoulder pain 11/01/2016   Right low back pain 11/01/2016   Elevated blood pressure, situational 05/05/2016   Surgical menopause 01/29/2016   OSA on CPAP 12/22/2015   Cough 11/26/2015   Capsulitis 10/06/2015   Ganglion cyst of left foot 10/06/2015  Fatigue 09/13/2015   Asthma 09/08/2015   Exertional dyspnea 09/08/2015   Peroneal ganglion cyst 07/20/2015   Low back pain 07/20/2015   Peroneal tendinitis of left lower leg 06/25/2015   Nonallopathic lesion of cervical region 06/25/2015   Wheezing 05/08/2015   Polyarthralgia 05/08/2015   Peripheral edema 03/16/2015   Central line complication 03/16/2015   Chemotherapy induced neutropenia (HCC) 02/28/2015   Screening for colorectal cancer 01/05/2015   Chemotherapy induced nausea and vomiting 01/02/2015   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   Premature surgical menopause 12/17/2014   Leukopenia due to antineoplastic chemotherapy (HCC) 12/17/2014   Port-A-Cath in place 12/17/2014   Encounter for antineoplastic chemotherapy 12/17/2014   Myalgia 12/09/2014   Hypersensitivity reaction 12/05/2014   Poor venous access 11/28/2014   Postoperative  cellulitis of surgical wound 11/24/2014   Malignant neoplasm of both ovaries 11/10/2014   Anxiety 11/10/2014   Adnexal mass 11/10/2014   Encounter for well adult exam with abnormal findings 09/10/2014   Hypothyroidism 09/10/2014   Hypersomnolence 09/10/2014   Acute non-recurrent frontal sinusitis 06/24/2014   Right knee pain 06/24/2014   Lower back pain 01/08/2014   EAR PAIN, BILATERAL 12/18/2009   KNEE PAIN, RIGHT 12/18/2009   INGROWN TOENAIL 12/17/2008   BACK PAIN 12/17/2008   Acute bronchospasm 11/11/2008   Depression 11/29/2007   Insomnia 11/29/2007   Generalized anxiety disorder 04/13/2007   Allergic rhinitis 04/13/2007   GERD 04/13/2007   Endometriosis determined by laparoscopy 04/13/2007   Migraine headache 01/29/2007    ONSET DATE: DOS 10/12/22 Rt CTR  REFERRING DIAG: Z98.890 (ICD-10-CM) - S/P carpal tunnel release   THERAPY DIAG:  S/P carpal tunnel release  Muscle weakness (generalized)  Other lack of coordination  Localized edema  Pain in right hand  Paresthesia of skin  Stiffness of right wrist, not elsewhere classified  Rationale for Evaluation and Treatment: Rehabilitation  SUBJECTIVE:   SUBJECTIVE STATEMENT: She is 7 weeks post-op Rt CTR now. She states having new numbness in her thumb that was not there before surgery, swelling, tenderness to touch around the base of the thumb, decreased gripping ability and typing ability and overall use of right dominant hand.  She also states having significant right shoulder stiffness, muscle spasms and postural issues which she feels may be contributing towards nerve impingements and aggravation of nerves.  She states that her left hand is doing very well after a carpal tunnel release that she had earlier in the year.  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  She does state wearing a neoprene style hand and wrist brace while at work but not at night now.   PERTINENT HISTORY: From MD note: "Examination shows  fully healed surgical scar with slight tenderness to the area consistent with pillar pain.  She has slight numbness to the tip of the thumb.  Motor function of the thenar muscle compartment intact.  She has positive Tinel in the distal forearm.  No signs of infection.  Impression is a 6 weeks status post right carpal tunnel release.  I feel that her symptoms are consistent with postsurgical changes.  I would like to give this more time as I do expect this to fully resolve.  She is scheduled for hand therapy."  PRECAUTIONS: None  RED FLAGS: None   WEIGHT BEARING RESTRICTIONS: No  PAIN:  Are you having pain? Yes: NPRS scale: none at rest, in last week up to 10/10 Pain location: Rt thumb, thenar eminence, volar sx area  Pain description:  burning, sharp at times, numb Aggravating factors: riding a bike, pressure through palm and scar Relieving factors: rest  FALLS: Has patient fallen in last 6 months? No  LIVING ENVIRONMENT: Lives with: lives with their spouse Has following equipment at home: None  PLOF: Independent  PATIENT GOALS: To improve symptoms to regain function for everyday activities   OBJECTIVE: (All objective assessments below are from initial evaluation on: 12/01/22 unless otherwise specified.)   HAND DOMINANCE: Right   ADLs: Overall ADLs: States decreased ability to grab, hold household objects, pain and difficutly to open containers, perform FMS tasks (manipulate fasteners on clothing), mild to moderate bathing problems as well.    FUNCTIONAL OUTCOME MEASURES: Eval: Quck DASH 39% impairment today  (Higher % Score  =  More Impairment)     UPPER EXTREMITY ROM     Shoulder to Wrist AROM Right eval  Shoulder flexion tbd  Shoulder abduction   Shoulder extension   Shoulder internal rotation   Shoulder external rotation   Elbow flexion full  Elbow extension full  Forearm supination 60 (69* Lt)   Forearm pronation  85  Wrist flexion 72 (69 Lt)  Wrist extension  55 ( 67 Lt)  (Blank rows = not tested)   Hand AROM Right eval  Full Fist Ability (or Gap to Distal Palmar Crease) Loose, full fist  Thumb Opposition  (Kapandji Scale)    Thumb MCP (0-60)   Thumb IP (0-80)   (Blank rows = not tested)   UPPER EXTREMITY MMT:    Eval:  NT at eval due to time constraints and painful sensitivity to the right hand and wrist.  Will be tested when appropriate.   MMT Right TBD  Shoulder flexion   Shoulder abduction   Shoulder adduction   Shoulder extension   Shoulder internal rotation   Shoulder external rotation   Middle trapezius   Lower trapezius   Elbow flexion   Elbow extension   Forearm supination   Forearm pronation   Wrist flexion   Wrist extension   Wrist ulnar deviation   Wrist radial deviation   (Blank rows = not tested)  HAND FUNCTION: Eval: Observed weakness in affected hand.  Grip strength Right: 25 lbs, Left: 63 lbs  Tip pinch: Rt: 6#, Lt: 11#   COORDINATION: Eval: Observed coordination impairments with affected hand. 9 Hole Peg Test Right: 24sec, Left: 23 sec (approx 20 sec is WFL)   SENSATION: Eval:  Deep pressure intact today, though light touch diminished around sx area and thumb of Rt hand.  Rt hand: Static 2pt discrimination thumb: 6-6mm, Index 4-28mm, small finger 4mm.   EDEMA:   Eval: Perhaps mildly swollen and still a bit pink and red in the right hand and thenar area as well as volar wrist since surgery.  COGNITION: Eval: Overall cognitive status: WFL for evaluation today   OBSERVATIONS:   Eval:  Positive Phalen's Test 4 sec Rt wrist, negative in Lt after 30 sec; positive scratch-collapse test in Rt carpal tunnel and Rt pronator and was negative in Lt side.  She does have forward sloping neck and shoulder postures and has a seated typing job and states resting on her wrists while typing.  Presents as median nerve sensitivity/pain partially from entrapment and tenderness through the tight shoulder, bicep,  pronator groups as well as possible scar adhesions from carpal tunnel release as well as swelling and stiffness through the right wrist.   TODAY'S TREATMENT:  Post-evaluation treatment:  She was given initial  self-care/safety education on preventing nerve impingement with sleeping postures, habits and work tasks.  She was shown how to adapt her desk environment to avoid resting on her carpal tunnel areas, "float" her wrists.  She was also educated on the following home exercise program to be done 4-6 times a day as tolerated, lightly, without pain, without applying too much pressure through the median nerve distribution with nerve gliding (a neuromuscular reeducation exercise).  She states understanding this program at first glance, demonstrates some of these back for understanding, but will need reviewed in upcoming sessions.  Exercises - Seated Scapular Retraction  - 4 x daily - 5-10 reps - Forearm Supination Stretch  - 3-4 x daily - 3-5 reps - 15 sec hold - Wrist Prayer Stretch  - 4 x daily - 3-5 reps - 15 sec hold - Bend and Pull Back Wrist SLOWLY  - 4 x daily - 10-15 reps - Median Nerve Flossing  - 4-5 x daily - 5-10 reps    PATIENT EDUCATION: Education details: See tx section above for details  Person educated: Patient Education method: Engineer, structural, Teach back, Handouts  Education comprehension: States and demonstrates understanding, Additional Education required    HOME EXERCISE PROGRAM: Access Code: [email or web address] URL: [email or web address] Date: 12/01/2022 Prepared by: Fannie Knee   GOALS: Goals reviewed with patient? Yes   SHORT TERM GOALS: (STG required if POC>30 days) Target Date: 12/16/22  Pt will obtain protective, custom orthotic. Goal status: TBD/PRN  2.  Pt will demo/state understanding of initial HEP to improve pain levels and prerequisite motion. Goal status: INITIAL   LONG TERM GOALS: Target Date: 01/13/23  Pt will improve functional  ability by decreased impairment per Quick DASH assessment from 39% to 15% or better, for better quality of life. Goal status: INITIAL  2.  Pt will improve grip strength in Rt dom hand from 25lbs to at least 45lbs for functional use at home and in IADLs. Goal status: INITIAL  3.  Pt will improve A/ROM in Rt wrist ext from 55* to at least 70*, to have functional motion for tasks like reach and grasp.  Goal status: INITIAL  4.  Pt will improve strength in Rt wrist flexion to at least 4+/5 MMT to have increased functional ability to carry out selfcare and higher-level homecare tasks with no difficulty. Goal status: INITIAL  5.  Pt will improve coordination skills in Rt and, as seen by better score on 9HPT testing from 24sec to 21sec to have increased functional ability to carry out fine motor tasks (fasteners, etc.) and more complex, coordinated IADLs (meal prep, sports, etc.).  Goal status: INITIAL  6.  Pt will decrease pain at worst from 10/10 to 3/10 or better to have better sleep and occupational participation in daily roles. Goal status: INITIAL  ASSESSMENT:  CLINICAL IMPRESSION: Patient is a 52 y.o. female who was seen today for occupational therapy evaluation for continued right hand paresthesia after carpal tunnel release also weakness, pain, tightness in her right shoulder and decreased functional ability.  She will benefit from outpatient occupational therapy to improve her symptoms and increase her quality of life.   PERFORMANCE DEFICITS: in functional skills including ADLs, IADLs, coordination, dexterity, sensation, edema, ROM, strength, pain, fascial restrictions, muscle spasms, flexibility, Fine motor control, Gross motor control, body mechanics, endurance, decreased knowledge of precautions, and UE functional use, cognitive skills including problem solving and safety awareness, and psychosocial skills including coping strategies, environmental adaptation, and habits.    IMPAIRMENTS: are  limiting patient from ADLs, IADLs, work, and leisure.   COMORBIDITIES: has co-morbidities such as post ovarian ca tx (radiation, chemo), lumbago, OA, anxiety, depression and more   that affects occupational performance. Patient will benefit from skilled OT to address above impairments and improve overall function.  MODIFICATION OR ASSISTANCE TO COMPLETE EVALUATION: No modification of tasks or assist necessary to complete an evaluation.  OT OCCUPATIONAL PROFILE AND HISTORY: Problem focused assessment: Including review of records relating to presenting problem.  CLINICAL DECISION MAKING: Moderate - several treatment options, min-mod task modification necessary  REHAB POTENTIAL: Excellent  EVALUATION COMPLEXITY: Low      PLAN:  OT FREQUENCY: 2x/week  OT DURATION: 6 weeks through 01/13/23 as needed   PLANNED INTERVENTIONS: self care/ADL training, therapeutic exercise, therapeutic activity, neuromuscular re-education, manual therapy, scar mobilization, splinting, electrical stimulation, ultrasound, fluidotherapy, compression bandaging, moist heat, cryotherapy, contrast bath, patient/family education, energy conservation, coping strategies training, DME and/or AE instructions, and Dry needling  RECOMMENDED OTHER SERVICES: none now   CONSULTED AND AGREED WITH PLAN OF CARE: Patient  PLAN FOR NEXT SESSION: Go over home exercise program, add light scar massages and mobilizations, check shoulder mobility and also assign shoulder exercise program.  When tolerated manual muscle test the wrist and forearm and assign strengthening as appropriate after the 94-month marker   Fannie Knee, OTR/L  12/01/2022, 6:06 PM

## 2022-11-29 NOTE — Patient Instructions (Signed)

## 2022-11-30 ENCOUNTER — Encounter: Payer: Self-pay | Admitting: Internal Medicine

## 2022-11-30 ENCOUNTER — Other Ambulatory Visit (HOSPITAL_BASED_OUTPATIENT_CLINIC_OR_DEPARTMENT_OTHER): Payer: Self-pay

## 2022-11-30 MED ORDER — HYDROCODONE BIT-HOMATROP MBR 5-1.5 MG/5ML PO SOLN
5.0000 mL | Freq: Four times a day (QID) | ORAL | 0 refills | Status: AC | PRN
  Filled 2022-11-30: qty 180, 10d supply, fill #0

## 2022-11-30 MED ORDER — AZITHROMYCIN 250 MG PO TABS
ORAL_TABLET | ORAL | 1 refills | Status: AC
  Filled 2022-11-30: qty 6, 5d supply, fill #0

## 2022-12-01 ENCOUNTER — Ambulatory Visit (INDEPENDENT_AMBULATORY_CARE_PROVIDER_SITE_OTHER): Payer: Commercial Managed Care - PPO | Admitting: Rehabilitative and Restorative Service Providers"

## 2022-12-01 ENCOUNTER — Encounter: Payer: Self-pay | Admitting: Rehabilitative and Restorative Service Providers"

## 2022-12-01 ENCOUNTER — Other Ambulatory Visit: Payer: Self-pay

## 2022-12-01 DIAGNOSIS — M79641 Pain in right hand: Secondary | ICD-10-CM

## 2022-12-01 DIAGNOSIS — R600 Localized edema: Secondary | ICD-10-CM

## 2022-12-01 DIAGNOSIS — R278 Other lack of coordination: Secondary | ICD-10-CM

## 2022-12-01 DIAGNOSIS — M25631 Stiffness of right wrist, not elsewhere classified: Secondary | ICD-10-CM

## 2022-12-01 DIAGNOSIS — Z9889 Other specified postprocedural states: Secondary | ICD-10-CM

## 2022-12-01 DIAGNOSIS — M6281 Muscle weakness (generalized): Secondary | ICD-10-CM

## 2022-12-01 DIAGNOSIS — R202 Paresthesia of skin: Secondary | ICD-10-CM

## 2022-12-02 ENCOUNTER — Other Ambulatory Visit (HOSPITAL_BASED_OUTPATIENT_CLINIC_OR_DEPARTMENT_OTHER): Payer: Self-pay

## 2022-12-05 ENCOUNTER — Encounter (INDEPENDENT_AMBULATORY_CARE_PROVIDER_SITE_OTHER): Payer: Self-pay | Admitting: Family Medicine

## 2022-12-05 ENCOUNTER — Ambulatory Visit (INDEPENDENT_AMBULATORY_CARE_PROVIDER_SITE_OTHER): Payer: Commercial Managed Care - PPO | Admitting: Family Medicine

## 2022-12-05 ENCOUNTER — Encounter: Payer: Self-pay | Admitting: Orthopaedic Surgery

## 2022-12-05 VITALS — BP 129/86 | HR 67 | Temp 98.6°F | Ht 67.0 in | Wt 296.0 lb

## 2022-12-05 DIAGNOSIS — R632 Polyphagia: Secondary | ICD-10-CM

## 2022-12-05 DIAGNOSIS — Z6841 Body Mass Index (BMI) 40.0 and over, adult: Secondary | ICD-10-CM | POA: Diagnosis not present

## 2022-12-05 DIAGNOSIS — F4323 Adjustment disorder with mixed anxiety and depressed mood: Secondary | ICD-10-CM | POA: Diagnosis not present

## 2022-12-05 NOTE — Assessment & Plan Note (Signed)
Work related to stress remains high.  She has a good support system at home. She is doing well on Wellbutrin XL 150 mg daily.  Continue to work on stress reduction, mindful eating.  Consider outside counseling.

## 2022-12-05 NOTE — Progress Notes (Signed)
Office: [phone]  /  Fax: [phone]  WEIGHT SUMMARY AND BIOMETRICS  Starting Date: 11/16/21  Starting Weight: 333lb   Weight Lost Since Last Visit: 0lb   Vitals Temp: 98.6 F (37 C) BP: 129/86 Pulse Rate: 67 SpO2: 100 %   Body Composition  Body Fat %: 55.9 % Fat Mass (lbs): 165.4 lbs Muscle Mass (lbs): 124 lbs Visceral Fat Rating : 19   HPI  Chief Complaint: OBESITY  [name] is here to discuss her progress with her obesity treatment plan. She is on the practicing portion control and making smarter food choices, such as increasing vegetables and decreasing simple carbohydrates and states she is following her eating plan approximately 20 % of the time. 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    Interval History:  Since last office visit she is  up 2 lb She never started on Qsymia due cost She has seen an increase in appetite off of Wegovy She has been doing some yardwork and is riding her bike She is eating dinner later at night and has cut back on snacking She has some sugar cravings She still has some work related stress She is struggling to get in enough protein with meals, especially breakfast  Pharmacotherapy: none  PHYSICAL EXAM:  Blood pressure 129/86, pulse 67, temperature 98.6 F (37 C), height 5\' 7"  (1.702 m), weight 296 lb (134.3 kg), last menstrual period 11/02/2014, SpO2 100%. Body mass index is 46.36 kg/m.  General: She is overweight, cooperative, alert, well developed, and in no acute distress. PSYCH: Has normal mood, affect and thought process.   Lungs: Normal breathing effort, no conversational dyspnea.   ASSESSMENT AND PLAN  TREATMENT PLAN FOR OBESITY:  Recommended Dietary Goals  [name] is currently in the action stage of change. As such, her goal is to continue weight management plan. She has agreed to keeping a food journal and adhering to recommended goals of 1600 calories and  110 g of  protein.  Behavioral Intervention  We discussed the following Behavioral Modification Strategies today: increasing lean protein intake, decreasing simple carbohydrates , increasing vegetables, increasing lower glycemic fruits, increasing fiber rich foods, increasing water intake, work on tracking and journaling calories using tracking application, keeping healthy foods at home, continue to practice mindfulness when eating, planning for success, and better snacking choices. - reviewed high protein breakfast and snack foods  Additional resources provided today: NA  Recommended Physical Activity Goals  [name] has been advised to work up to 150 minutes of moderate intensity aerobic activity a week and strengthening exercises 2-3 times per week for cardiovascular health, weight loss maintenance and preservation of muscle mass.   She has agreed to Start aerobic activity with a goal of 150 minutes a week at moderate intensity.   Pharmacotherapy changes for the treatment of obesity: none  ASSOCIATED CONDITIONS ADDRESSED TODAY  Polyphagia Assessment & Plan: Worsened off Wegovy.  Unable to start Qsymia due to cost.  Unable to take generic phentermine due to use of Adderall for attention deficit disorder.  Currently on Wellbutrin/unable to take Contrave for this reason.  She is getting inadequate intake of lean protein with meals, especially breakfast.  She has room for improvement with nonstarchy vegetable intake.  Hunger was recently increased during a round of prednisone, prescribed by rheumatology for sarcoidosis.  Reviewed options with patient.  Encouraged her to begin tracking caloric intake using the my fitness pal app and aiming for  1600 cal/day which should include at least 110 g of protein intake daily.  We reviewed high-protein breakfast and snack options.  She may increase her intake of nonstarchy vegetables and water intake.   Morbid obesity: Starting BMI 52.1  BMI 45.0-49.9,  adult (HCC)  Adjustment Disorder with Mixed Anxiety and Depressed Mood Assessment & Plan: Work related to stress remains high.  She has a good support system at home. She is doing well on Wellbutrin XL 150 mg daily.  Continue to work on stress reduction, mindful eating.  Consider outside counseling.       She was informed of the importance of frequent follow up visits to maximize her success with intensive lifestyle modifications for her multiple health conditions.   ATTESTASTION STATEMENTS:  Reviewed by clinician on day of visit: allergies, medications, problem list, medical history, surgical history, family history, social history, and previous encounter notes pertinent to obesity diagnosis.   I have personally spent 30 minutes total time today in preparation, patient care, nutritional counseling and documentation for this visit, including the following: review of clinical lab tests; review of medical tests/procedures/services.      Sara Brink, DO DABFM, DABOM [facility] [address] [place], [place] [address] [phone]

## 2022-12-05 NOTE — Assessment & Plan Note (Signed)
Worsened off Wegovy.  Unable to start Qsymia due to cost.  Unable to take generic phentermine due to use of Adderall for attention deficit disorder.  Currently on Wellbutrin/unable to take Contrave for this reason.  She is getting inadequate intake of lean protein with meals, especially breakfast.  She has room for improvement with nonstarchy vegetable intake.  Hunger was recently increased during a round of prednisone, prescribed by rheumatology for sarcoidosis.  Reviewed options with patient.  Encouraged her to begin tracking caloric intake using the my fitness pal app and aiming for 1600 cal/day which should include at least 110 g of protein intake daily.  We reviewed high-protein breakfast and snack options.  She may increase her intake of nonstarchy vegetables and water intake.

## 2022-12-05 NOTE — Telephone Encounter (Signed)
Yes approve

## 2022-12-08 ENCOUNTER — Encounter: Payer: Commercial Managed Care - PPO | Admitting: Rehabilitative and Restorative Service Providers"

## 2022-12-09 ENCOUNTER — Other Ambulatory Visit: Payer: Self-pay

## 2022-12-09 ENCOUNTER — Other Ambulatory Visit: Payer: Self-pay | Admitting: Internal Medicine

## 2022-12-09 ENCOUNTER — Encounter: Payer: Commercial Managed Care - PPO | Admitting: Rehabilitative and Restorative Service Providers"

## 2022-12-09 ENCOUNTER — Other Ambulatory Visit (HOSPITAL_BASED_OUTPATIENT_CLINIC_OR_DEPARTMENT_OTHER): Payer: Self-pay

## 2022-12-09 ENCOUNTER — Other Ambulatory Visit: Payer: Self-pay | Admitting: Emergency Medicine

## 2022-12-09 DIAGNOSIS — F419 Anxiety disorder, unspecified: Secondary | ICD-10-CM

## 2022-12-09 MED ORDER — BENZONATATE 100 MG PO CAPS
100.0000 mg | ORAL_CAPSULE | Freq: Three times a day (TID) | ORAL | 0 refills | Status: DC | PRN
  Filled 2022-12-09 – 2022-12-10 (×3): qty 20, 7d supply, fill #0

## 2022-12-09 MED ORDER — ALPRAZOLAM 1 MG PO TABS
1.0000 mg | ORAL_TABLET | Freq: Two times a day (BID) | ORAL | 2 refills | Status: DC | PRN
Start: 2022-12-09 — End: 2023-03-20
  Filled 2022-12-09: qty 60, 30d supply, fill #0
  Filled 2023-01-12: qty 60, 30d supply, fill #1
  Filled 2023-02-16: qty 60, 30d supply, fill #2

## 2022-12-10 ENCOUNTER — Other Ambulatory Visit (HOSPITAL_BASED_OUTPATIENT_CLINIC_OR_DEPARTMENT_OTHER): Payer: Self-pay

## 2022-12-12 ENCOUNTER — Other Ambulatory Visit (HOSPITAL_BASED_OUTPATIENT_CLINIC_OR_DEPARTMENT_OTHER): Payer: Self-pay

## 2022-12-12 MED ORDER — AMPHETAMINE-DEXTROAMPHET ER 20 MG PO CP24
20.0000 mg | ORAL_CAPSULE | Freq: Every day | ORAL | 0 refills | Status: DC
  Filled 2022-12-12 – 2022-12-14 (×2): qty 30, 30d supply, fill #0

## 2022-12-13 ENCOUNTER — Ambulatory Visit (INDEPENDENT_AMBULATORY_CARE_PROVIDER_SITE_OTHER): Payer: Commercial Managed Care - PPO | Admitting: Rehabilitative and Restorative Service Providers"

## 2022-12-13 ENCOUNTER — Encounter: Payer: Self-pay | Admitting: Rehabilitative and Restorative Service Providers"

## 2022-12-13 DIAGNOSIS — M6281 Muscle weakness (generalized): Secondary | ICD-10-CM

## 2022-12-13 DIAGNOSIS — R278 Other lack of coordination: Secondary | ICD-10-CM | POA: Diagnosis not present

## 2022-12-13 DIAGNOSIS — M79641 Pain in right hand: Secondary | ICD-10-CM | POA: Diagnosis not present

## 2022-12-13 DIAGNOSIS — R202 Paresthesia of skin: Secondary | ICD-10-CM | POA: Diagnosis not present

## 2022-12-13 DIAGNOSIS — M25631 Stiffness of right wrist, not elsewhere classified: Secondary | ICD-10-CM | POA: Diagnosis not present

## 2022-12-13 DIAGNOSIS — R600 Localized edema: Secondary | ICD-10-CM | POA: Diagnosis not present

## 2022-12-13 DIAGNOSIS — Z9889 Other specified postprocedural states: Secondary | ICD-10-CM | POA: Diagnosis not present

## 2022-12-13 NOTE — Therapy (Addendum)
 OUTPATIENT OCCUPATIONAL THERAPY TREATMENT & DISCHARGE NOTE  Patient Name: [name] MRN: [id number] DOB:Oct 05, 1970, 52 y.o., female Today's Date: 12/13/2022  PCP: Norleen Agent, MD REFERRING PROVIDER:  Jerri Kay HERO, MD             OCCUPATIONAL THERAPY DISCHARGE SUMMARY  Visits from Start of Care: 2  Pt did not return to therapy, so goals could not be addressed.   Pt now officially discharged form OP OT.  See note below for additional details.   [name], OTR/L, CHT 01/04/24              END OF SESSION:  OT End of Session - 12/13/22 1527     Visit Number 2    Number of Visits 12    Date for OT Re-Evaluation 01/13/23    Authorization Type [name]    OT Start Time 1527    OT Stop Time 1601    OT Time Calculation (min) 34 min    Activity Tolerance Patient tolerated treatment well;No increased pain;Patient limited by fatigue;Patient limited by pain    Behavior During Therapy [facility] for tasks assessed/performed              Past Medical History:  Diagnosis Date   Anxiety    Arthritis    Asthma    Back pain    Barrett's esophagus    Chemotherapy-induced neuropathy (HCC)    Chronic lower back pain    Depression    Dyspnea    Elevated blood pressure, situational 05/05/2016   Endometriosis    Exercise-induced asthma 09/08/2015   Fatigue    Gallbladder problem    GERD (gastroesophageal reflux disease)    Heartburn    History of kidney stones    Hypertension    Hypothyroidism    Infertility, female    Joint pain    Joint stiffness    Lower extremity edema    Migraine    ovarian cancer    Ovarian cancer, bilateral (HCC) 11/28/2014   PONV (postoperative nausea and vomiting)    Sarcoidosis    Sleep apnea    SOBOE (shortness of breath on exertion)    Thyroid  disease    Umbilical hernia    Vitamin D  deficiency    woke up while porta cath removed    Past Surgical History:  Procedure Laterality Date   ABDOMINAL  HYSTERECTOMY  11/10/2014   at [facility], Exp lap, supracervical hyst, BSO, infracolic omentectomy, lymphadenectomy, aortic lymph node sampling   BIOPSY  11/04/2021   Procedure: BIOPSY;  Surgeon: Charlanne Groom, MD;  Location: [facility];  Service: Gastroenterology;;   BRONCHIAL NEEDLE ASPIRATION BIOPSY  12/16/2020   Procedure: BRONCHIAL NEEDLE ASPIRATION BIOPSIES;  Surgeon: Jude Harden GAILS, MD;  Location: [facility] ENDOSCOPY;  Service: Cardiopulmonary;;   BRONCHIAL WASHINGS  12/16/2020   Procedure: BRONCHIAL WASHINGS;  Surgeon: Jude Harden GAILS, MD;  Location: [facility] ENDOSCOPY;  Service: Cardiopulmonary;;   BUNIONECTOMY Left 09/14/2021   CARPAL TUNNEL RELEASE Left 09/07/2022   Procedure: LEFT CARPAL TUNNEL RELEASE, LEFT THUMB CARPOMETACARPAL INJECTION;  Surgeon: Jerri Kay HERO, MD;  Location: [facility];  Service: Orthopedics;  Laterality: Left;   CARPAL TUNNEL RELEASE Right 10/12/2022   Procedure: RIGHT CARPAL TUNNEL RELEASE, RIGHT THUMB CARPOMETACARPAL INJECTION;  Surgeon: Jerri Kay HERO, MD;  Location: [facility];  Service: Orthopedics;  Laterality: Right;   CHOLECYSTECTOMY N/A 07/24/2018   Procedure: LAPAROSCOPIC CHOLECYSTECTOMY WITH INTRAOPERATIVE CHOLANGIOGRAM;  Surgeon: Eletha Boas, MD;  Location: [facility];  Service: General;  Laterality: N/A;   COLONOSCOPY WITH PROPOFOL  N/A 11/04/2021   Procedure: COLONOSCOPY WITH PROPOFOL ;  Surgeon: Charlanne Groom, MD;  Location: [facility];  Service: Gastroenterology;  Laterality: N/A;   ERCP N/A 07/26/2018   Procedure: ENDOSCOPIC RETROGRADE CHOLANGIOPANCREATOGRAPHY (ERCP);  Surgeon: Aneita Gwendlyn DASEN, MD;  Location: [facility];  Service: Endoscopy;  Laterality: N/A;   ESOPHAGOGASTRODUODENOSCOPY (EGD) WITH PROPOFOL  N/A 11/04/2021   Procedure: ESOPHAGOGASTRODUODENOSCOPY (EGD) WITH PROPOFOL ;  Surgeon: Charlanne Groom, MD;  Location: [facility];  Service: Gastroenterology;  Laterality: N/A;   GANGLION CYST EXCISION Right    hand   HYSTERECTOMY  ABDOMINAL WITH SALPINGO-OOPHORECTOMY  11/10/2014   ovarian cancer, tumor removal   INCISIONAL HERNIA REPAIR N/A 12/25/2020   Procedure: LAPAROSCOPIC INCISIONAL HERNIA REPAIR WITH MESH;  Surgeon: Kinsinger, Herlene Righter, MD;  Location: [facility];  Service: General;  Laterality: N/A;   ingrown nail removal     IR GENERIC HISTORICAL  05/12/2016   IR REMOVAL TUN ACCESS W/ PORT W/O FL MOD SED 05/12/2016 [facility]   LAPAROSCOPIC ENDOMETRIOSIS FULGURATION     LIPOMA EXCISION Left    ankle   meniscal tear Right    MENISCUS REPAIR Right    oral sugery     POLYPECTOMY  11/04/2021   Procedure: POLYPECTOMY;  Surgeon: Charlanne Groom, MD;  Location: [facility];  Service: Gastroenterology;;   Pat a cath removal     PORTA CATH INSERTION     REMOVAL OF STONES  07/26/2018   Procedure: REMOVAL OF STONES;  Surgeon: Aneita Gwendlyn DASEN, MD;  Location: [facility];  Service: Endoscopy;;   SPHINCTEROTOMY  07/26/2018   Procedure: ANNETT;  Surgeon: Aneita Gwendlyn DASEN, MD;  Location: [facility];  Service: Endoscopy;;   UMBILICAL HERNIA REPAIR  12/29/2020   VIDEO BRONCHOSCOPY WITH ENDOBRONCHIAL ULTRASOUND N/A 12/16/2020   Procedure: VIDEO BRONCHOSCOPY WITH ENDOBRONCHIAL ULTRASOUND;  Surgeon: Jude Harden GAILS, MD;  Location: [facility] ENDOSCOPY;  Service: Cardiopulmonary;  Laterality: N/A;   Patient Active Problem List   Diagnosis Date Noted   Carpal tunnel syndrome on right 10/12/2022   Ganglion cyst of flexor tendon sheath of finger of right hand 10/12/2022   Primary osteoarthritis of first carpometacarpal joint of right hand 10/12/2022   Adjustment disorder with mixed anxiety and depressed mood 09/12/2022   Carpal tunnel syndrome on left 09/07/2022   Eating disorder 08/11/2022   Blurry vision, bilateral 06/28/2022   Primary osteoarthritis of first carpometacarpal joint of left hand 06/28/2022   Hoarseness 06/18/2022   Insulin  resistance 05/18/2022   Major depressive disorder 03/09/2022   Polyphagia  03/09/2022   Left foot pain 02/16/2022   Osteoarthritis of both knees 01/13/2022   Hot flashes due to menopause 12/19/2021   ADD (attention deficit disorder) 12/19/2021   SOBOE (shortness of breath on exertion) 11/16/2021   Elevated glucose 11/16/2021   Vitamin D  deficiency 11/16/2021   Prediabetes 11/16/2021   Bunion, left 08/06/2021   Morbid obesity (HCC) with initial BMI 52 06/26/2021   Venous insufficiency 06/25/2021   Incisional hernia 12/25/2020   Mediastinal lymphadenopathy    Pulmonary nodules 10/20/2020   Multiple skin nodules 07/24/2020   Right lumbar radiculopathy 05/18/2020   Periumbilical hernia 05/18/2020   TB lung, latent 05/18/2020   Acute non-recurrent maxillary sinusitis 11/09/2019   HTN (hypertension) 04/14/2019   Mass of left wrist 04/14/2019   External otitis of right ear 04/14/2019   Choledocholithiasis    Abnormal cholangiogram    Cholecystitis with cholelithiasis 07/24/2018   Hot flash  not due to menopause 10/16/2017   Eczema 10/16/2017   Flu-like symptoms 06/20/2017   Cognitive changes 01/30/2017   Disturbed concentration 01/26/2017   Left shoulder pain 11/01/2016   Right low back pain 11/01/2016   Elevated blood pressure, situational 05/05/2016   Surgical menopause 01/29/2016   OSA on CPAP 12/22/2015   Cough 11/26/2015   Capsulitis 10/06/2015   Ganglion cyst of left foot 10/06/2015   Fatigue 09/13/2015   Asthma 09/08/2015   Exertional dyspnea 09/08/2015   Peroneal ganglion cyst 07/20/2015   Low back pain 07/20/2015   Peroneal tendinitis of left lower leg 06/25/2015   Nonallopathic lesion of cervical region 06/25/2015   Wheezing 05/08/2015   Polyarthralgia 05/08/2015   Peripheral edema 03/16/2015   Central line complication 03/16/2015   Chemotherapy induced neutropenia (HCC) 02/28/2015   Screening for colorectal cancer 01/05/2015   Chemotherapy induced nausea and vomiting 01/02/2015   Chemotherapy-induced peripheral neuropathy (HCC)  01/02/2015   Premature surgical menopause 12/17/2014   Leukopenia due to antineoplastic chemotherapy (HCC) 12/17/2014   Port-A-Cath in place 12/17/2014   Encounter for antineoplastic chemotherapy 12/17/2014   Myalgia 12/09/2014   Hypersensitivity reaction 12/05/2014   Poor venous access 11/28/2014   Postoperative cellulitis of surgical wound 11/24/2014   Malignant neoplasm of both ovaries 11/10/2014   Anxiety 11/10/2014   Adnexal mass 11/10/2014   Encounter for well adult exam with abnormal findings 09/10/2014   Hypothyroidism 09/10/2014   Hypersomnolence 09/10/2014   Acute non-recurrent frontal sinusitis 06/24/2014   Right knee pain 06/24/2014   Lower back pain 01/08/2014   EAR PAIN, BILATERAL 12/18/2009   KNEE PAIN, RIGHT 12/18/2009   INGROWN TOENAIL 12/17/2008   BACK PAIN 12/17/2008   Acute bronchospasm 11/11/2008   Depression 11/29/2007   Insomnia 11/29/2007   Generalized anxiety disorder 04/13/2007   Allergic rhinitis 04/13/2007   GERD 04/13/2007   Endometriosis determined by laparoscopy 04/13/2007   Migraine headache 01/29/2007    ONSET DATE: DOS 10/12/22 Rt CTR  REFERRING DIAG: Z98.890 (ICD-10-CM) - S/P carpal tunnel release   THERAPY DIAG:  S/P carpal tunnel release  Muscle weakness (generalized)  Other lack of coordination  Pain in right hand  Paresthesia of skin  Localized edema  Stiffness of right wrist, not elsewhere classified  Rationale for Evaluation and Treatment: Rehabilitation  PERTINENT HISTORY: From MD note: Examination shows fully healed surgical scar with slight tenderness to the area consistent with pillar pain.  She has slight numbness to the tip of the thumb.  Motor function of the thenar muscle compartment intact.  She has positive Tinel in the distal forearm.  No signs of infection.  Impression is a 6 weeks status post right carpal tunnel release.  I feel that her symptoms are consistent with postsurgical changes.  I would like to give  this more time as I do expect this to fully resolve.  She is scheduled for hand therapy.  She states having new numbness in her thumb that was not there before surgery, swelling, tenderness to touch around the base of the thumb, decreased gripping ability and typing ability and overall use of right dominant hand.  She also states having significant right shoulder stiffness, muscle spasms and postural issues which she feels may be contributing towards nerve impingements and aggravation of nerves.  She states that her left hand is doing very well after a carpal tunnel release that she had earlier in the year.  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  She does state wearing  a neoprene style hand and wrist brace while at work but not at night now.  PRECAUTIONS: None; WEIGHT BEARING RESTRICTIONS: No  RED FLAGS: None   SUBJECTIVE:   SUBJECTIVE STATEMENT: She is 9 weeks post-op Rt CTR now. She returns 2 weeks after initial eval, states she returned to work and her Rt wrist/hand has been bothersome to her. She also admits to not doing HEP consistently.      PAIN:  Are you having pain?  Yes: NPRS scale: 2/10 at rest now;  in last week up to 5/10 Pain location: Rt thumb, thenar eminence, volar sx area  Pain description: burning, sharp at times, numb Aggravating factors: riding a bike, pressure through palm and scar Relieving factors: rest  FALLS: Has patient fallen in last 6 months? No  LIVING ENVIRONMENT: Lives with: lives with their spouse Has following equipment at home: None  PLOF: Independent  PATIENT GOALS: To improve symptoms to regain function for everyday activities   OBJECTIVE: (All objective assessments below are from initial evaluation on: 12/01/22 unless otherwise specified.)   HAND DOMINANCE: Right   ADLs: Overall ADLs: States decreased ability to grab, hold household objects, pain and difficutly to open containers, perform FMS tasks (manipulate fasteners on  clothing), mild to moderate bathing problems as well.    FUNCTIONAL OUTCOME MEASURES: Eval: Quck DASH 39% impairment today  (Higher % Score  =  More Impairment)     UPPER EXTREMITY ROM     Shoulder to Wrist AROM Right eval Rt 12/13/22  Shoulder flexion tbd 140  Shoulder abduction    Shoulder extension  74  Shoulder internal rotation  54  Shoulder external rotation  44  Elbow flexion full   Elbow extension full   Forearm supination 60 (69* Lt)  60  Forearm pronation  85 85  Wrist flexion 72 (69 Lt) 71  Wrist extension 55 ( 67 Lt) 59  (Blank rows = not tested)   Hand AROM Right eval  Full Fist Ability (or Gap to Distal Palmar Crease) Loose, full fist  Thumb Opposition  (Kapandji Scale)    Thumb MCP (0-60)   Thumb IP (0-80)   (Blank rows = not tested)   UPPER EXTREMITY MMT:    Eval:  NT at eval due to time constraints and painful sensitivity to the right hand and wrist.  Will be tested when appropriate.   MMT Right TBD  Shoulder flexion   Shoulder abduction   Shoulder adduction   Shoulder extension   Shoulder internal rotation   Shoulder external rotation   Middle trapezius   Lower trapezius   Elbow flexion   Elbow extension   Forearm supination   Forearm pronation   Wrist flexion   Wrist extension   Wrist ulnar deviation   Wrist radial deviation   (Blank rows = not tested)  HAND FUNCTION: Eval: Observed weakness in affected hand.  Grip strength Right: 25 lbs, Left: 63 lbs  Tip pinch: Rt: 6#, Lt: 11#   COORDINATION: Eval: Observed coordination impairments with affected hand. 9 Hole Peg Test Right: 24sec, Left: 23 sec (approx 20 sec is WFL)   SENSATION: Eval:  Deep pressure intact today, though light touch diminished around sx area and thumb of Rt hand.  Rt hand: Static 2pt discrimination thumb: 6-71mm, Index 4-82mm, small finger 4mm.   EDEMA:   Eval: Perhaps mildly swollen and still a bit pink and red in the right hand and thenar area as well as volar  wrist since  surgery.  OBSERVATIONS:   Eval:  Positive Phalen's Test 4 sec Rt wrist, negative in Lt after 30 sec; positive scratch-collapse test in Rt carpal tunnel and Rt pronator and was negative in Lt side.  She does have forward sloping neck and shoulder postures and has a seated typing job and states resting on her wrists while typing.  Presents as median nerve sensitivity/pain partially from entrapment and tenderness through the tight shoulder, bicep, pronator groups as well as possible scar adhesions from carpal tunnel release as well as swelling and stiffness through the right wrist.   TODAY'S TREATMENT:  12/13/22: She performs active range of motion at the shoulder and forearm and wrist today, this shows no significant improvements yet as she has not been doing her home exercises.  Her shoulder does show significant tightness that could be contributing to nerve impingement and so her home exercises were updated today to include new shoulder stretches.  These were gone over with her OT demonstrating.  She states understanding and not having any pain with these.  OT reviews again how to use a rolled towel or similar object to float her forearm and wrist to not apply pressure while keyboarding, as she states this has been a problem on her return to work.  OT also reviews median nerve gliding with her and adds an additional option that focuses more on the wrist and fingers as bolded below.  She states understanding these things and will return on Thursday.   Exercises - Seated Scapular Retraction  - 4 x daily - 5-10 reps - Seated Shoulder External Rotation PROM on Table  - 3-4 x daily - 3-5 reps - 15 sec hold - Standing neck/upper traps stretch  - 4-6 x daily - 3 reps - 15 sec hold - Doorway Stretches  - 4-5 x daily - 3 reps - 15 sec hold - Forearm Supination Stretch  - 3-4 x daily - 3-5 reps - 15 sec hold - Hammer Stretch or Strength   - 3-6 x daily - 3 reps - 15 hold - Wrist Prayer Stretch   - 4 x daily - 3-5 reps - 15 sec hold - Median Nerve Flossing  - 4-5 x daily - 5-10 reps - Seated Median Nerve Glide  - 3-4 x daily - 5 reps    PATIENT EDUCATION: Education details: See tx section above for details  Person educated: Patient Education method: Verbal Instruction, Teach back, Handouts  Education comprehension: States and demonstrates understanding, Additional Education required    HOME EXERCISE PROGRAM: Access Code: [email or web address] URL: [email or web address]   GOALS: Goals reviewed with patient? Yes   SHORT TERM GOALS: (STG required if POC>30 days) Target Date: 12/16/22  Pt will obtain protective, custom orthotic. Goal status: TBD/PRN  2.  Pt will demo/state understanding of initial HEP to improve pain levels and prerequisite motion. Goal status: 12/13/22: MET- though she hasn't done consistently yet    LONG TERM GOALS: Target Date: 01/13/23  Pt will improve functional ability by decreased impairment per Quick DASH assessment from 39% to 15% or better, for better quality of life. Goal status: INITIAL  2.  Pt will improve grip strength in Rt dom hand from 25lbs to at least 45lbs for functional use at home and in IADLs. Goal status: INITIAL  3.  Pt will improve A/ROM in Rt wrist ext from 55* to at least 70*, to have functional motion for tasks like reach and grasp.  Goal status: INITIAL  4.  Pt will  improve strength in Rt wrist flexion to at least 4+/5 MMT to have increased functional ability to carry out selfcare and higher-level homecare tasks with no difficulty. Goal status: INITIAL  5.  Pt will improve coordination skills in Rt and, as seen by better score on 9HPT testing from 24sec to 21sec to have increased functional ability to carry out fine motor tasks (fasteners, etc.) and more complex, coordinated IADLs (meal prep, sports, etc.).  Goal status: INITIAL  6.  Pt will decrease pain at worst from 10/10 to 3/10 or better to have better sleep and  occupational participation in daily roles. Goal status: INITIAL  ASSESSMENT:  CLINICAL IMPRESSION: 12/13/22: She has not made many improvements yet except pain at worst levels-due to not attending therapy at and also not doing her home exercise program and returning to work.  Hopefully will see some progress within the next week or 2 and we will also start to upgrade into light strengthening to support her as needed at the forearm and hand in the next 1 to 2 weeks.   PLAN:  OT FREQUENCY: 2x/week  OT DURATION: 6 weeks through 01/13/23 as needed   PLANNED INTERVENTIONS: self care/ADL training, therapeutic exercise, therapeutic activity, neuromuscular re-education, manual therapy, scar mobilization, splinting, electrical stimulation, ultrasound, fluidotherapy, compression bandaging, moist heat, cryotherapy, contrast bath, patient/family education, energy conservation, coping strategies training, DME and/or AE instructions, and Dry needling  CONSULTED AND AGREED WITH PLAN OF CARE: Patient  PLAN FOR NEXT SESSION: Reviewed new home exercises for the shoulder, check to see if motion is improving, if tolerated try some light strengthening to support good postures and grip and pinch abilities.  Manual therapy and modalities as helpful   [name], OTR/L  12/13/2022, 5:10 PM

## 2022-12-14 ENCOUNTER — Other Ambulatory Visit (HOSPITAL_BASED_OUTPATIENT_CLINIC_OR_DEPARTMENT_OTHER): Payer: Self-pay

## 2022-12-15 ENCOUNTER — Other Ambulatory Visit (HOSPITAL_BASED_OUTPATIENT_CLINIC_OR_DEPARTMENT_OTHER): Payer: Self-pay

## 2022-12-15 ENCOUNTER — Encounter: Payer: Commercial Managed Care - PPO | Admitting: Rehabilitative and Restorative Service Providers"

## 2022-12-16 ENCOUNTER — Other Ambulatory Visit: Payer: Self-pay

## 2022-12-17 ENCOUNTER — Other Ambulatory Visit (HOSPITAL_BASED_OUTPATIENT_CLINIC_OR_DEPARTMENT_OTHER): Payer: Self-pay

## 2022-12-20 ENCOUNTER — Other Ambulatory Visit: Payer: Self-pay

## 2022-12-20 ENCOUNTER — Encounter: Payer: Commercial Managed Care - PPO | Admitting: Rehabilitative and Restorative Service Providers"

## 2022-12-22 ENCOUNTER — Encounter: Payer: Commercial Managed Care - PPO | Admitting: Rehabilitative and Restorative Service Providers"

## 2023-01-12 ENCOUNTER — Other Ambulatory Visit: Payer: Self-pay

## 2023-01-12 ENCOUNTER — Other Ambulatory Visit: Payer: Self-pay | Admitting: Internal Medicine

## 2023-01-13 ENCOUNTER — Other Ambulatory Visit: Payer: Self-pay

## 2023-01-13 ENCOUNTER — Other Ambulatory Visit (HOSPITAL_BASED_OUTPATIENT_CLINIC_OR_DEPARTMENT_OTHER): Payer: Self-pay

## 2023-01-13 MED ORDER — LEVOTHYROXINE SODIUM 100 MCG PO TABS
100.0000 ug | ORAL_TABLET | Freq: Every day | ORAL | 3 refills | Status: DC
  Filled 2023-01-13: qty 90, 90d supply, fill #0
  Filled 2023-04-23: qty 90, 90d supply, fill #1

## 2023-01-13 MED ORDER — ESTRADIOL 1 MG PO TABS
1.0000 mg | ORAL_TABLET | Freq: Every day | ORAL | 3 refills | Status: DC
  Filled 2023-01-13: qty 90, 90d supply, fill #0
  Filled 2023-04-23: qty 90, 90d supply, fill #1

## 2023-01-20 ENCOUNTER — Other Ambulatory Visit: Payer: Self-pay | Admitting: Internal Medicine

## 2023-01-20 ENCOUNTER — Other Ambulatory Visit (HOSPITAL_BASED_OUTPATIENT_CLINIC_OR_DEPARTMENT_OTHER): Payer: Self-pay

## 2023-01-20 MED ORDER — AMPHETAMINE-DEXTROAMPHET ER 20 MG PO CP24
20.0000 mg | ORAL_CAPSULE | Freq: Every day | ORAL | 0 refills | Status: DC
  Filled 2023-01-20: qty 30, 30d supply, fill #0

## 2023-01-20 MED ORDER — LOSARTAN POTASSIUM 25 MG PO TABS
25.0000 mg | ORAL_TABLET | Freq: Every day | ORAL | 3 refills | Status: DC
  Filled 2023-01-20: qty 90, 90d supply, fill #0
  Filled 2023-04-23: qty 90, 90d supply, fill #1

## 2023-01-23 ENCOUNTER — Other Ambulatory Visit: Payer: Self-pay

## 2023-01-24 ENCOUNTER — Other Ambulatory Visit (HOSPITAL_BASED_OUTPATIENT_CLINIC_OR_DEPARTMENT_OTHER): Payer: Self-pay

## 2023-01-24 ENCOUNTER — Other Ambulatory Visit: Payer: Self-pay

## 2023-01-30 ENCOUNTER — Ambulatory Visit: Payer: Commercial Managed Care - PPO | Admitting: Neurology

## 2023-01-30 DIAGNOSIS — Z029 Encounter for administrative examinations, unspecified: Secondary | ICD-10-CM

## 2023-01-31 ENCOUNTER — Encounter: Payer: Self-pay | Admitting: Neurology

## 2023-02-16 ENCOUNTER — Other Ambulatory Visit: Payer: Self-pay | Admitting: Internal Medicine

## 2023-02-16 ENCOUNTER — Other Ambulatory Visit (HOSPITAL_BASED_OUTPATIENT_CLINIC_OR_DEPARTMENT_OTHER): Payer: Self-pay

## 2023-02-16 ENCOUNTER — Other Ambulatory Visit: Payer: Self-pay

## 2023-02-16 ENCOUNTER — Other Ambulatory Visit: Payer: Self-pay | Admitting: Gastroenterology

## 2023-02-16 ENCOUNTER — Other Ambulatory Visit: Payer: Self-pay | Admitting: Physician Assistant

## 2023-02-16 MED ORDER — TRAZODONE HCL 50 MG PO TABS
25.0000 mg | ORAL_TABLET | Freq: Every evening | ORAL | 1 refills | Status: DC | PRN
  Filled 2023-02-16: qty 90, 90d supply, fill #0
  Filled 2023-06-13: qty 90, 90d supply, fill #1

## 2023-02-16 MED ORDER — OMEPRAZOLE 40 MG PO CPDR
40.0000 mg | DELAYED_RELEASE_CAPSULE | Freq: Every day | ORAL | 3 refills | Status: DC
  Filled 2023-02-16: qty 30, 30d supply, fill #0
  Filled 2023-03-20: qty 30, 30d supply, fill #1
  Filled 2023-04-23: qty 30, 30d supply, fill #2
  Filled 2023-05-29: qty 30, 30d supply, fill #3

## 2023-02-16 MED ORDER — METHOTREXATE SODIUM CHEMO INJECTION 50 MG/2ML
20.0000 mg | INTRAMUSCULAR | 0 refills | Status: DC
  Filled 2023-02-16: qty 10, 84d supply, fill #0

## 2023-02-16 NOTE — Telephone Encounter (Signed)
Last Fill: 11/17/2022  Labs: 11/09/2022 WBC count is likely elevated due to recent surgeries.  Glucose is 110. Rest of CMP WNL. WBC count is borderline elevated-11.3 and absolute neutrophils are elevated-please clarify if she has had any recent infections? [facility] labs are due.  Next Visit: 05/01/2023  Last Visit: 11/29/2022  DX: Sarcoidosis   Current Dose per office note 11/29/2022: Methotrexate 0.8 mL sq injections once weekly   Okay to refill Methotrexate?

## 2023-02-22 ENCOUNTER — Other Ambulatory Visit: Payer: Self-pay | Admitting: Internal Medicine

## 2023-02-23 ENCOUNTER — Other Ambulatory Visit (HOSPITAL_BASED_OUTPATIENT_CLINIC_OR_DEPARTMENT_OTHER): Payer: Self-pay

## 2023-02-23 MED ORDER — AMPHETAMINE-DEXTROAMPHET ER 20 MG PO CP24
20.0000 mg | ORAL_CAPSULE | Freq: Every day | ORAL | 0 refills | Status: DC
  Filled 2023-02-23: qty 30, 30d supply, fill #0

## 2023-02-24 ENCOUNTER — Other Ambulatory Visit (HOSPITAL_BASED_OUTPATIENT_CLINIC_OR_DEPARTMENT_OTHER): Payer: Self-pay

## 2023-03-08 ENCOUNTER — Telehealth: Payer: Commercial Managed Care - PPO | Admitting: Physician Assistant

## 2023-03-08 ENCOUNTER — Encounter: Payer: Self-pay | Admitting: Physician Assistant

## 2023-03-08 ENCOUNTER — Other Ambulatory Visit (HOSPITAL_BASED_OUTPATIENT_CLINIC_OR_DEPARTMENT_OTHER): Payer: Self-pay

## 2023-03-08 DIAGNOSIS — U071 COVID-19: Secondary | ICD-10-CM

## 2023-03-08 MED ORDER — PREDNISONE 20 MG PO TABS
40.0000 mg | ORAL_TABLET | Freq: Every day | ORAL | 0 refills | Status: DC
  Filled 2023-03-08: qty 10, 5d supply, fill #0

## 2023-03-08 MED ORDER — ALBUTEROL SULFATE HFA 108 (90 BASE) MCG/ACT IN AERS
2.0000 | INHALATION_SPRAY | Freq: Four times a day (QID) | RESPIRATORY_TRACT | 0 refills | Status: DC | PRN
Start: 2023-03-08 — End: 2023-05-23
  Filled 2023-03-08: qty 6.7, 25d supply, fill #0

## 2023-03-08 MED ORDER — BENZONATATE 100 MG PO CAPS
100.0000 mg | ORAL_CAPSULE | Freq: Three times a day (TID) | ORAL | 0 refills | Status: DC | PRN
Start: 2023-03-08 — End: 2023-05-23
  Filled 2023-03-08: qty 30, 10d supply, fill #0

## 2023-03-08 MED ORDER — NIRMATRELVIR/RITONAVIR (PAXLOVID)TABLET
3.0000 | ORAL_TABLET | Freq: Two times a day (BID) | ORAL | 0 refills | Status: AC
  Filled 2023-03-08: qty 30, 5d supply, fill #0

## 2023-03-08 NOTE — Patient Instructions (Signed)
[name], thank you for joining [name], PA-C for today's virtual visit.  While this provider is not your primary care provider (PCP), if your PCP is located in our provider database this encounter information will be shared with them immediately following your visit.   A [facility] MyChart account gives you access to today's visit and all your visits, tests, and labs performed at [facility] " click here if you don't have a [name] MyChart account or go to [email or web address]  Consent: (Patient) [name] provided verbal consent for this virtual visit at the beginning of the encounter.  Current Medications:  Current Outpatient Medications:    albuterol (VENTOLIN HFA) 108 (90 Base) MCG/ACT inhaler, Inhale 2 puffs into the lungs every 6 (six) hours as needed for wheezing or shortness of breath., Disp: 6.7 g, Rfl: 0   benzonatate (TESSALON) 100 MG capsule, Take 1 capsule (100 mg total) by mouth 3 (three) times daily as needed for cough., Disp: 30 capsule, Rfl: 0   nirmatrelvir/ritonavir (PAXLOVID) 20 x 150 MG & 10 x 100MG  TABS, Take 3 tablets by mouth 2 (two) times daily for 5 days. (Take nirmatrelvir 150 mg two tablets twice daily for 5 days and ritonavir 100 mg one tablet twice daily for 5 days) Patient GFR is 98, Disp: 30 tablet, Rfl: 0   predniSONE (DELTASONE) 20 MG tablet, Take 2 tablets (40 mg total) by mouth daily with breakfast., Disp: 10 tablet, Rfl: 0   ALPRAZolam (XANAX) 1 MG tablet, Take 1 tablet (1 mg total) by mouth 2 (two) times daily as needed for anxiety., Disp: 60 tablet, Rfl: 2   amphetamine-dextroamphetamine (ADDERALL XR) 20 MG 24 hr capsule, Take 1 capsule (20 mg total) by mouth daily., Disp: 30 capsule, Rfl: 0   Blood Pressure Monitoring (OMRON 3 SERIES BP MONITOR) DEVI, Use as directed., Disp: 1 each, Rfl: 0   buPROPion (WELLBUTRIN XL) 300 MG 24 hr tablet, Take 1 tablet (300 mg total) by mouth daily., Disp: 90 tablet, Rfl: 3    clobetasol cream (TEMOVATE) 0.05 %, Apply 1 Application topically 2 (two) times daily as needed., Disp: 45 g, Rfl: 0   cyclobenzaprine (FLEXERIL) 5 MG tablet, Take 1 tablet (5 mg total) by mouth 2 (two) times daily as needed for muscle spasms., Disp: 90 tablet, Rfl: 3   estradiol (ESTRACE) 1 MG tablet, Take 1 tablet (1 mg total) by mouth daily., Disp: 90 tablet, Rfl: 3   fluticasone-salmeterol (ADVAIR DISKUS) 250-50 MCG/ACT AEPB, Inhale 1 puff into the lungs in the morning and at bedtime., Disp: 180 each, Rfl: 3   folic acid (FOLVITE) 1 MG tablet, Take 2 tablets (2 mg total) by mouth daily., Disp: 180 tablet, Rfl: 3   gabapentin (NEURONTIN) 600 MG tablet, Take 1.5 tablets (900 mg total) by mouth 2 (two) times daily. OK to take extra dose as needed, Disp: 270 tablet, Rfl: 3   HYDROcodone-acetaminophen (NORCO) 5-325 MG tablet, Take 1 tablet by mouth 2 (two) times daily as needed., Disp: 20 tablet, Rfl: 0   levothyroxine (SYNTHROID) 100 MCG tablet, Take 1 tablet (100 mcg total) by mouth daily., Disp: 90 tablet, Rfl: 3   losartan (COZAAR) 25 MG tablet, Take 1 tablet (25 mg total) by mouth daily., Disp: 90 tablet, Rfl: 3   methotrexate 50 MG/2ML injection, Inject 0.8 mLs (20 mg total) into the skin once a week., Disp: 10 mL, Rfl: 0   omeprazole (PRILOSEC) 40 MG capsule, Take 1 capsule (40 mg total) by  mouth daily. Please call [phone] to schedule an office visit for more refills, Disp: 30 capsule, Rfl: 3   polyvinyl alcohol (ARTIFICIAL TEARS) 1.4 % ophthalmic solution, Place 1 drop into both eyes as needed for dry eyes., Disp: 15 mL, Rfl: 0   sertraline (ZOLOFT) 100 MG tablet, Take 2 tablets (200 mg total) by mouth daily., Disp: 180 tablet, Rfl: 3   traZODone (DESYREL) 50 MG tablet, Take 0.5-1 tablets (25-50 mg total) by mouth at bedtime as needed for sleep., Disp: 90 tablet, Rfl: 1   Zoster Vaccine Adjuvanted (SHINGRIX) injection, Inject into the muscle., Disp: 0.5 mL, Rfl: 0   Medications ordered in  this encounter:  Meds ordered this encounter  Medications   albuterol (VENTOLIN HFA) 108 (90 Base) MCG/ACT inhaler    Sig: Inhale 2 puffs into the lungs every 6 (six) hours as needed for wheezing or shortness of breath.    Dispense:  6.7 g    Refill:  0    Order Specific Question:   Supervising Provider    Answer:   Merrilee Jansky [[id number]]   benzonatate (TESSALON) 100 MG capsule    Sig: Take 1 capsule (100 mg total) by mouth 3 (three) times daily as needed for cough.    Dispense:  30 capsule    Refill:  0    Order Specific Question:   Supervising Provider    Answer:   Loreli Dollar   nirmatrelvir/ritonavir (PAXLOVID) 20 x 150 MG & 10 x 100MG  TABS    Sig: Take 3 tablets by mouth 2 (two) times daily for 5 days. (Take nirmatrelvir 150 mg two tablets twice daily for 5 days and ritonavir 100 mg one tablet twice daily for 5 days) Patient GFR is 98    Dispense:  30 tablet    Refill:  0    Order Specific Question:   Supervising Provider    Answer:   Merrilee Jansky [[id number]]   predniSONE (DELTASONE) 20 MG tablet    Sig: Take 2 tablets (40 mg total) by mouth daily with breakfast.    Dispense:  10 tablet    Refill:  0    Order Specific Question:   Supervising Provider    Answer:   Merrilee Jansky [id number]     *If you need refills on other medications prior to your next appointment, please contact your pharmacy*  Follow-Up: Call back or seek an in-person evaluation if the symptoms worsen or if the condition fails to improve as anticipated.  [facility] [phone]  Care Instructions: Please keep well-hydrated and get plenty of rest. Start a saline nasal rinse to flush out your nasal passages. You can use plain Mucinex to help thin congestion. Because we have to hold your Advair while on the Paxlovid.  Take the prednisone as directed.  If you have a humidifier, running in the bedroom at night. I want you to start OTC vitamin D3 1000 units daily,  vitamin C 1000 mg daily, and a zinc supplement. Please take prescribed medications as directed.  Isolation Instructions: You are to isolate at home until you have been fever free for at least 24 hours without a fever-reducing medication, and symptoms have been steadily improving for 24 hours. At that time,  you can end isolation but need to mask for an additional 5 days.   If you must be around other household members who do not have symptoms, you need to make sure that both you and  the family members are masking consistently with a high-quality mask.  If you note any worsening of symptoms despite treatment, please seek an in-person evaluation ASAP. If you note any significant shortness of breath or any chest pain, please seek ER evaluation. Please do not delay care!   COVID-19: What to Do if You Are Sick If you test positive and are an older adult or someone who is at high risk of getting very sick from COVID-19, treatment may be available. Contact a healthcare provider right away after a positive test to determine if you are eligible, even if your symptoms are mild right now. You can also visit a Test to Treat location and, if eligible, receive a prescription from a provider. Don't delay: Treatment must be started within the first few days to be effective. If you have a fever, cough, or other symptoms, you might have COVID-19. Most people have mild illness and are able to recover at home. If you are sick: Keep track of your symptoms. If you have an emergency warning sign (including trouble breathing), call 911. Steps to help prevent the spread of COVID-19 if you are sick If you are sick with COVID-19 or think you might have COVID-19, follow the steps below to care for yourself and to help protect other people in your home and community. Stay home except to get medical care Stay home. Most people with COVID-19 have mild illness and can recover at home without medical care. Do not leave your home,  except to get medical care. Do not visit public areas and do not go to places where you are unable to wear a mask. Take care of yourself. Get rest and stay hydrated. Take over-the-counter medicines, such as acetaminophen, to help you feel better. Stay in touch with your doctor. Call before you get medical care. Be sure to get care if you have trouble breathing, or have any other emergency warning signs, or if you think it is an emergency. Avoid public transportation, ride-sharing, or taxis if possible. Get tested If you have symptoms of COVID-19, get tested. While waiting for test results, stay away from others, including staying apart from those living in your household. Get tested as soon as possible after your symptoms start. Treatments may be available for people with COVID-19 who are at risk for becoming very sick. Don't delay: Treatment must be started early to be effective--some treatments must begin within 5 days of your first symptoms. Contact your healthcare provider right away if your test result is positive to determine if you are eligible. Self-tests are one of several options for testing for the virus that causes COVID-19 and may be more convenient than laboratory-based tests and point-of-care tests. Ask your healthcare provider or your local health department if you need help interpreting your test results. You can visit your state, tribal, local, and territorial health department's website to look for the latest local information on testing sites. Separate yourself from other people As much as possible, stay in a specific room and away from other people and pets in your home. If possible, you should use a separate bathroom. If you need to be around other people or animals in or outside of the home, wear a well-fitting mask. Tell your close contacts that they may have been exposed to COVID-19. An infected person can spread COVID-19 starting 48 hours (or 2 days) before the person has any  symptoms or tests positive. By letting your close contacts know they may have been exposed  to COVID-19, you are helping to protect everyone. See COVID-19 and Animals if you have questions about pets. If you are diagnosed with COVID-19, someone from the health department may call you. Answer the call to slow the spread. Monitor your symptoms Symptoms of COVID-19 include fever, cough, or other symptoms. Follow care instructions from your healthcare provider and local health department. Your local health authorities may give instructions on checking your symptoms and reporting information. When to seek emergency medical attention Look for emergency warning signs* for COVID-19. If someone is showing any of these signs, seek emergency medical care immediately: Trouble breathing Persistent pain or pressure in the chest New confusion Inability to wake or stay awake Pale, gray, or blue-colored skin, lips, or nail beds, depending on skin tone *This list is not all possible symptoms. Please call your medical provider for any other symptoms that are severe or concerning to you. Call 911 or call ahead to your local emergency facility: Notify the operator that you are seeking care for someone who has or may have COVID-19. Call ahead before visiting your doctor Call ahead. Many medical visits for routine care are being postponed or done by phone or telemedicine. If you have a medical appointment that cannot be postponed, call your doctor's office, and tell them you have or may have COVID-19. This will help the office protect themselves and other patients. If you are sick, wear a well-fitting mask You should wear a mask if you must be around other people or animals, including pets (even at home). Wear a mask with the best fit, protection, and comfort for you. You don't need to wear the mask if you are alone. If you can't put on a mask (because of trouble breathing, for example), cover your coughs and sneezes  in some other way. Try to stay at least 6 feet away from other people. This will help protect the people around you. Masks should not be placed on young children under age 76 years, anyone who has trouble breathing, or anyone who is not able to remove the mask without help. Cover your coughs and sneezes Cover your mouth and nose with a tissue when you cough or sneeze. Throw away used tissues in a lined trash can. Immediately wash your hands with soap and water for at least 20 seconds. If soap and water are not available, clean your hands with an alcohol-based hand sanitizer that contains at least 60% alcohol. Clean your hands often Wash your hands often with soap and water for at least 20 seconds. This is especially important after blowing your nose, coughing, or sneezing; going to the bathroom; and before eating or preparing food. Use hand sanitizer if soap and water are not available. Use an alcohol-based hand sanitizer with at least 60% alcohol, covering all surfaces of your hands and rubbing them together until they feel dry. Soap and water are the best option, especially if hands are visibly dirty. Avoid touching your eyes, nose, and mouth with unwashed hands. Handwashing Tips Avoid sharing personal household items Do not share dishes, drinking glasses, cups, eating utensils, towels, or bedding with other people in your home. Wash these items thoroughly after using them with soap and water or put in the dishwasher. Clean surfaces in your home regularly Clean and disinfect high-touch surfaces (for example, doorknobs, tables, handles, light switches, and countertops) in your "sick room" and bathroom. In shared spaces, you should clean and disinfect surfaces and items after each use by the person who is  ill. If you are sick and cannot clean, a caregiver or other person should only clean and disinfect the area around you (such as your bedroom and bathroom) on an as needed basis. Your caregiver/other  person should wait as long as possible (at least several hours) and wear a mask before entering, cleaning, and disinfecting shared spaces that you use. Clean and disinfect areas that may have blood, stool, or body fluids on them. Use household cleaners and disinfectants. Clean visible dirty surfaces with household cleaners containing soap or detergent. Then, use a household disinfectant. Use a product from [employer] List N: Disinfectants for Coronavirus (COVID-19). Be sure to follow the instructions on the label to ensure safe and effective use of the product. Many products recommend keeping the surface wet with a disinfectant for a certain period of time (look at "contact time" on the product label). You may also need to wear personal protective equipment, such as gloves, depending on the directions on the product label. Immediately after disinfecting, wash your hands with soap and water for 20 seconds. For completed guidance on cleaning and disinfecting your home, visit Complete Disinfection Guidance. Take steps to improve ventilation at home Improve ventilation (air flow) at home to help prevent from spreading COVID-19 to other people in your household. Clear out COVID-19 virus particles in the air by opening windows, using air filters, and turning on fans in your home. Use this interactive tool to learn how to improve air flow in your home. When you can be around others after being sick with COVID-19 Deciding when you can be around others is different for different situations. Find out when you can safely end home isolation. For any additional questions about your care, contact your healthcare provider or state or local health department. 07/28/2020 Content source: [facility] ([facility]), Division of Viral Diseases This information is not intended to replace advice given to you by your health care provider. Make sure you discuss any questions you have with  your health care provider. Document Revised: 09/10/2020 Document Reviewed: 09/10/2020 Elsevier Patient Education  2022 [employer].  If you have been instructed to have an in-person evaluation today at a local Urgent Care facility, please use the link below. It will take you to a list of all of our available [facility], including address, phone number and hours of operation. Please do not delay care.  [facility]  If you or a family member do not have a primary care provider, use the link below to schedule a visit and establish care. When you choose a [facility] primary care physician or advanced practice provider, you gain a long-term partner in health. Find a Primary Care Provider  Learn more about [facility]'s in-office and virtual care options:  - Get Care Now

## 2023-03-08 NOTE — Progress Notes (Signed)
Virtual Visit Consent   [name], you are scheduled for a virtual visit with a [facility] provider today. Just as with appointments in the office, your consent must be obtained to participate. Your consent will be active for this visit and any virtual visit you may have with one of our providers in the next 365 days. If you have a MyChart account, a copy of this consent can be sent to you electronically.  As this is a virtual visit, video technology does not allow for your provider to perform a traditional examination. This may limit your provider's ability to fully assess your condition. If your provider identifies any concerns that need to be evaluated in person or the need to arrange testing (such as labs, EKG, etc.), we will make arrangements to do so. Although advances in technology are sophisticated, we cannot ensure that it will always work on either your end or our end. If the connection with a video visit is poor, the visit may have to be switched to a telephone visit. With either a video or telephone visit, we are not always able to ensure that we have a secure connection.  By engaging in this virtual visit, you consent to the provision of healthcare and authorize for your insurance to be billed (if applicable) for the services provided during this visit. Depending on your insurance coverage, you may receive a charge related to this service.  I need to obtain your verbal consent now. Are you willing to proceed with your visit today? Heyab Gauntt has provided verbal consent on 03/08/2023 for a virtual visit (video or telephone). [name], [place]  Date: 03/08/2023 11:42 AM  Virtual Visit via Video Note   I, [name], connected with  Alaycia Koker  ([id number], Oct 30, 1970) on 03/08/23 at 11:30 AM EDT by a video-enabled telemedicine application and verified that I am speaking with the correct person using two identifiers.  Location: Patient: Virtual Visit Location  Patient: Home Provider: Virtual Visit Location Provider: Home Office   I discussed the limitations of evaluation and management by telemedicine and the availability of in person appointments. The patient expressed understanding and agreed to proceed.    History of Present Illness: [name] is a 52 y.o. who identifies as a female who was assigned female at birth, and is being seen today for COVID-19. Patient endorses husband also with COVID-19. Endorses her symptoms starting Sunday with some chest tightness, shortness of breath with exertion,  nasal congestion, sinus pressure and fatigue. Denies fever but notes has been taking Tylenol-containing products. Has had some sweats and chills.    HPI: HPI  Problems:  Patient Active Problem List   Diagnosis Date Noted   Carpal tunnel syndrome on right 10/12/2022   Ganglion cyst of flexor tendon sheath of finger of right hand 10/12/2022   Primary osteoarthritis of first carpometacarpal joint of right hand 10/12/2022   Adjustment disorder with mixed anxiety and depressed mood 09/12/2022   Carpal tunnel syndrome on left 09/07/2022   Eating disorder 08/11/2022   Blurry vision, bilateral 06/28/2022   Primary osteoarthritis of first carpometacarpal joint of left hand 06/28/2022   Hoarseness 06/18/2022   Insulin resistance 05/18/2022   Major depressive disorder 03/09/2022   Polyphagia 03/09/2022   Left foot pain 02/16/2022   Osteoarthritis of both knees 01/13/2022   Hot flashes due to menopause 12/19/2021   ADD (attention deficit disorder) 12/19/2021   SOBOE (shortness of breath on exertion) 11/16/2021   Elevated glucose 11/16/2021  Vitamin D deficiency 11/16/2021   Prediabetes 11/16/2021   Bunion, left 08/06/2021   Morbid obesity (HCC) with initial BMI 52 06/26/2021   Venous insufficiency 06/25/2021   Incisional hernia 12/25/2020   Mediastinal lymphadenopathy    Pulmonary nodules 10/20/2020   Multiple skin nodules 07/24/2020   Right  lumbar radiculopathy 05/18/2020   Periumbilical hernia 05/18/2020   TB lung, latent 05/18/2020   Acute non-recurrent maxillary sinusitis 11/09/2019   HTN (hypertension) 04/14/2019   Mass of left wrist 04/14/2019   External otitis of right ear 04/14/2019   Choledocholithiasis    Abnormal cholangiogram    Cholecystitis with cholelithiasis 07/24/2018   Hot flash not due to menopause 10/16/2017   Eczema 10/16/2017   Flu-like symptoms 06/20/2017   Cognitive changes 01/30/2017   Disturbed concentration 01/26/2017   Left shoulder pain 11/01/2016   Right low back pain 11/01/2016   Elevated blood pressure, situational 05/05/2016   Surgical menopause 01/29/2016   OSA on CPAP 12/22/2015   Cough 11/26/2015   Capsulitis 10/06/2015   Ganglion cyst of left foot 10/06/2015   Fatigue 09/13/2015   Asthma 09/08/2015   Exertional dyspnea 09/08/2015   Peroneal ganglion cyst 07/20/2015   Low back pain 07/20/2015   Peroneal tendinitis of left lower leg 06/25/2015   Nonallopathic lesion of cervical region 06/25/2015   Wheezing 05/08/2015   Polyarthralgia 05/08/2015   Peripheral edema 03/16/2015   Central line complication 03/16/2015   Chemotherapy induced neutropenia (HCC) 02/28/2015   Screening for colorectal cancer 01/05/2015   Chemotherapy induced nausea and vomiting 01/02/2015   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   Premature surgical menopause 12/17/2014   Leukopenia due to antineoplastic chemotherapy (HCC) 12/17/2014   Port-A-Cath in place 12/17/2014   Encounter for antineoplastic chemotherapy 12/17/2014   Myalgia 12/09/2014   Hypersensitivity reaction 12/05/2014   Poor venous access 11/28/2014   Postoperative cellulitis of surgical wound 11/24/2014   Malignant neoplasm of both ovaries 11/10/2014   Anxiety 11/10/2014   Adnexal mass 11/10/2014   Encounter for well adult exam with abnormal findings 09/10/2014   Hypothyroidism 09/10/2014   Hypersomnolence  09/10/2014   Acute non-recurrent frontal sinusitis 06/24/2014   Right knee pain 06/24/2014   Lower back pain 01/08/2014   Otalgia 12/18/2009   KNEE PAIN, RIGHT 12/18/2009   INGROWN TOENAIL 12/17/2008   Backache 12/17/2008   Acute bronchospasm 11/11/2008   Depression 11/29/2007   Insomnia 11/29/2007   Generalized anxiety disorder 04/13/2007   Allergic rhinitis 04/13/2007   GERD 04/13/2007   Endometriosis determined by laparoscopy 04/13/2007   Migraine headache 01/29/2007    Allergies:  Allergies  Allergen Reactions   Moxifloxacin Anaphylaxis    needs epinephrine shot   Quinolones Anaphylaxis, Shortness Of Breath and Swelling   Sulfonamide Derivatives Hives, Swelling and Rash    needs epinephrine shot--rash and lip swelling   Doxycycline Nausea And Vomiting   Escitalopram Oxalate Other (See Comments)     fatigue   Lisinopril Cough   Medications:  Current Outpatient Medications:    albuterol (VENTOLIN HFA) 108 (90 Base) MCG/ACT inhaler, Inhale 2 puffs into the lungs every 6 (six) hours as needed for wheezing or shortness of breath., Disp: 8 g, Rfl: 0   benzonatate (TESSALON) 100 MG capsule, Take 1 capsule (100 mg total) by mouth 3 (three) times daily as needed for cough., Disp: 30 capsule, Rfl: 0   nirmatrelvir/ritonavir (PAXLOVID) 20 x 150 MG & 10 x 100MG  TABS, Take 3 tablets by mouth 2 (two) times daily for 5  days. (Take nirmatrelvir 150 mg two tablets twice daily for 5 days and ritonavir 100 mg one tablet twice daily for 5 days) Patient GFR is 98, Disp: 30 tablet, Rfl: 0   predniSONE (DELTASONE) 20 MG tablet, Take 2 tablets (40 mg total) by mouth daily with breakfast., Disp: 10 tablet, Rfl: 0   ALPRAZolam (XANAX) 1 MG tablet, Take 1 tablet (1 mg total) by mouth 2 (two) times daily as needed for anxiety., Disp: 60 tablet, Rfl: 2   amphetamine-dextroamphetamine (ADDERALL XR) 20 MG 24 hr capsule, Take 1 capsule (20 mg total) by mouth daily., Disp: 30 capsule, Rfl: 0   Blood  Pressure Monitoring (OMRON 3 SERIES BP MONITOR) DEVI, Use as directed., Disp: 1 each, Rfl: 0   buPROPion (WELLBUTRIN XL) 300 MG 24 hr tablet, Take 1 tablet (300 mg total) by mouth daily., Disp: 90 tablet, Rfl: 3   clobetasol cream (TEMOVATE) 0.05 %, Apply 1 Application topically 2 (two) times daily as needed., Disp: 45 g, Rfl: 0   cyclobenzaprine (FLEXERIL) 5 MG tablet, Take 1 tablet (5 mg total) by mouth 2 (two) times daily as needed for muscle spasms., Disp: 90 tablet, Rfl: 3   estradiol (ESTRACE) 1 MG tablet, Take 1 tablet (1 mg total) by mouth daily., Disp: 90 tablet, Rfl: 3   fluticasone-salmeterol (ADVAIR DISKUS) 250-50 MCG/ACT AEPB, Inhale 1 puff into the lungs in the morning and at bedtime., Disp: 180 each, Rfl: 3   folic acid (FOLVITE) 1 MG tablet, Take 2 tablets (2 mg total) by mouth daily., Disp: 180 tablet, Rfl: 3   gabapentin (NEURONTIN) 600 MG tablet, Take 1.5 tablets (900 mg total) by mouth 2 (two) times daily. OK to take extra dose as needed, Disp: 270 tablet, Rfl: 3   HYDROcodone-acetaminophen (NORCO) 5-325 MG tablet, Take 1 tablet by mouth 2 (two) times daily as needed., Disp: 20 tablet, Rfl: 0   levothyroxine (SYNTHROID) 100 MCG tablet, Take 1 tablet (100 mcg total) by mouth daily., Disp: 90 tablet, Rfl: 3   losartan (COZAAR) 25 MG tablet, Take 1 tablet (25 mg total) by mouth daily., Disp: 90 tablet, Rfl: 3   methotrexate 50 MG/2ML injection, Inject 0.8 mLs (20 mg total) into the skin once a week., Disp: 10 mL, Rfl: 0   omeprazole (PRILOSEC) 40 MG capsule, Take 1 capsule (40 mg total) by mouth daily. Please call [phone] to schedule an office visit for more refills, Disp: 30 capsule, Rfl: 3   polyvinyl alcohol (ARTIFICIAL TEARS) 1.4 % ophthalmic solution, Place 1 drop into both eyes as needed for dry eyes., Disp: 15 mL, Rfl: 0   sertraline (ZOLOFT) 100 MG tablet, Take 2 tablets (200 mg total) by mouth daily., Disp: 180 tablet, Rfl: 3   traZODone (DESYREL) 50 MG tablet, Take  0.5-1 tablets (25-50 mg total) by mouth at bedtime as needed for sleep., Disp: 90 tablet, Rfl: 1   Zoster Vaccine Adjuvanted [facility]) injection, Inject into the muscle., Disp: 0.5 mL, Rfl: 0  Observations/Objective: Patient is well-developed, well-nourished in no acute distress.  Resting comfortably at home.  Head is normocephalic, atraumatic.  No labored breathing. Speech is clear and coherent with logical content.  Patient is alert and oriented at baseline.   Assessment and Plan: 1. COVID-19 - albuterol (VENTOLIN HFA) 108 (90 Base) MCG/ACT inhaler; Inhale 2 puffs into the lungs every 6 (six) hours as needed for wheezing or shortness of breath.  Dispense: 8 g; Refill: 0 - benzonatate (TESSALON) 100 MG capsule; Take 1 capsule (  100 mg total) by mouth 3 (three) times daily as needed for cough.  Dispense: 30 capsule; Refill: 0 - nirmatrelvir/ritonavir (PAXLOVID) 20 x 150 MG & 10 x 100MG  TABS; Take 3 tablets by mouth 2 (two) times daily for 5 days. (Take nirmatrelvir 150 mg two tablets twice daily for 5 days and ritonavir 100 mg one tablet twice daily for 5 days) Patient GFR is 98  Dispense: 30 tablet; Refill: 0 - predniSONE (DELTASONE) 20 MG tablet; Take 2 tablets (40 mg total) by mouth daily with breakfast.  Dispense: 10 tablet; Refill: 0  Patient with multiple risk factors for complicated course of illness. Discussed risks/benefits of antiviral medications including most common potential ADRs. Patient voiced understanding and would like to proceed with antiviral medication. They are candidate for Paxlovid. Will have her hold Advair while on Paxlovid due to interaction. Albuterol and prednisone sent in to take its place for now. Rx sent to pharmacy. Supportive measures, OTC medications and vitamin regimen reviewed. Tessalon per orders. Quarantine reviewed in detail. Strict ER precautions discussed with patient.    Follow Up Instructions: I discussed the assessment and treatment plan with the  patient. The patient was provided an opportunity to ask questions and all were answered. The patient agreed with the plan and demonstrated an understanding of the instructions.  A copy of instructions were sent to the patient via MyChart unless otherwise noted below.   The patient was advised to call back or seek an in-person evaluation if the symptoms worsen or if the condition fails to improve as anticipated.    Sara Climes, PA-C

## 2023-03-17 ENCOUNTER — Encounter: Payer: Self-pay | Admitting: Rheumatology

## 2023-03-17 NOTE — Telephone Encounter (Signed)
Prednisone is not recommended for the long-term use.  Prednisone causes weight gain, diabetes, hypertension, heart disease, cataracts and osteoporosis.  The treatment options for sarcoidosis are methotrexate and IV Remicade.  We can discuss those other options at the follow-up visit.  I would recommend restarting methotrexate 2 weeks after the COVID symptoms resolve.

## 2023-03-20 ENCOUNTER — Other Ambulatory Visit: Payer: Self-pay | Admitting: Internal Medicine

## 2023-03-20 ENCOUNTER — Other Ambulatory Visit (HOSPITAL_BASED_OUTPATIENT_CLINIC_OR_DEPARTMENT_OTHER): Payer: Self-pay

## 2023-03-20 DIAGNOSIS — F419 Anxiety disorder, unspecified: Secondary | ICD-10-CM

## 2023-03-20 MED ORDER — ALPRAZOLAM 1 MG PO TABS
1.0000 mg | ORAL_TABLET | Freq: Two times a day (BID) | ORAL | 2 refills | Status: DC | PRN
  Filled 2023-03-20: qty 60, 30d supply, fill #0
  Filled 2023-05-01: qty 60, 30d supply, fill #1
  Filled 2023-06-13: qty 60, 30d supply, fill #2

## 2023-03-21 ENCOUNTER — Other Ambulatory Visit: Payer: Self-pay

## 2023-03-27 ENCOUNTER — Other Ambulatory Visit: Payer: Self-pay | Admitting: Internal Medicine

## 2023-03-28 ENCOUNTER — Other Ambulatory Visit: Payer: Self-pay

## 2023-03-28 ENCOUNTER — Other Ambulatory Visit (HOSPITAL_BASED_OUTPATIENT_CLINIC_OR_DEPARTMENT_OTHER): Payer: Self-pay

## 2023-03-28 MED ORDER — AMPHETAMINE-DEXTROAMPHET ER 20 MG PO CP24
20.0000 mg | ORAL_CAPSULE | Freq: Every day | ORAL | 0 refills | Status: DC
  Filled 2023-03-28: qty 30, 30d supply, fill #0

## 2023-04-17 NOTE — Progress Notes (Deleted)
Office Visit Note  Patient: [name]             Date of Birth: 10-May-1970           MRN: [id number]             PCP: Corwin Levins, MD Referring: Corwin Levins, MD Visit Date: 05/01/2023 Occupation: @GUAROCC @  Subjective:    History of Present Illness: [name] is a 52 y.o. female with history of sarcoidosis and osteoarthritis.  Patient remains on  Methotrexate 0.8 mL sq injections once weekly and folic acid 2mg  by mouth daily.   CBC and CMP updated on 11/09/22. Orders for CBC and CMP released today.   Discussed the importance of holding methotrexate if she develops signs or symptoms of an infection and to resume once the infection has completely cleared.      Activities of Daily Living:  Patient reports morning stiffness for *** {minute/hour:19697}.   Patient {ACTIONS;DENIES/REPORTS:21021675::"Denies"} nocturnal pain.  Difficulty dressing/grooming: {ACTIONS;DENIES/REPORTS:21021675::"Denies"} Difficulty climbing stairs: {ACTIONS;DENIES/REPORTS:21021675::"Denies"} Difficulty getting out of chair: {ACTIONS;DENIES/REPORTS:21021675::"Denies"} Difficulty using hands for taps, buttons, cutlery, and/or writing: {ACTIONS;DENIES/REPORTS:21021675::"Denies"}  No Rheumatology ROS completed.   PMFS History:  Patient Active Problem List   Diagnosis Date Noted   Carpal tunnel syndrome on right 10/12/2022   Ganglion cyst of flexor tendon sheath of finger of right hand 10/12/2022   Primary osteoarthritis of first carpometacarpal joint of right hand 10/12/2022   Adjustment disorder with mixed anxiety and depressed mood 09/12/2022   Carpal tunnel syndrome on left 09/07/2022   Eating disorder 08/11/2022   Blurry vision, bilateral 06/28/2022   Primary osteoarthritis of first carpometacarpal joint of left hand 06/28/2022   Hoarseness 06/18/2022   Insulin resistance 05/18/2022   Major depressive disorder 03/09/2022   Polyphagia 03/09/2022   Left foot pain 02/16/2022   Osteoarthritis  of both knees 01/13/2022   Hot flashes due to menopause 12/19/2021   ADD (attention deficit disorder) 12/19/2021   SOBOE (shortness of breath on exertion) 11/16/2021   Elevated glucose 11/16/2021   Vitamin D deficiency 11/16/2021   Prediabetes 11/16/2021   Bunion, left 08/06/2021   Morbid obesity (HCC) with initial BMI 52 06/26/2021   Venous insufficiency 06/25/2021   Incisional hernia 12/25/2020   Mediastinal lymphadenopathy    Pulmonary nodules 10/20/2020   Multiple skin nodules 07/24/2020   Right lumbar radiculopathy 05/18/2020   Periumbilical hernia 05/18/2020   TB lung, latent 05/18/2020   Acute non-recurrent maxillary sinusitis 11/09/2019   HTN (hypertension) 04/14/2019   Mass of left wrist 04/14/2019   External otitis of right ear 04/14/2019   Choledocholithiasis    Abnormal cholangiogram    Cholecystitis with cholelithiasis 07/24/2018   Hot flash not due to menopause 10/16/2017   Eczema 10/16/2017   Flu-like symptoms 06/20/2017   Cognitive changes 01/30/2017   Disturbed concentration 01/26/2017   Left shoulder pain 11/01/2016   Right low back pain 11/01/2016   Elevated blood pressure, situational 05/05/2016   Surgical menopause 01/29/2016   OSA on CPAP 12/22/2015   Cough 11/26/2015   Capsulitis 10/06/2015   Ganglion cyst of left foot 10/06/2015   Fatigue 09/13/2015   Asthma 09/08/2015   Exertional dyspnea 09/08/2015   Peroneal ganglion cyst 07/20/2015   Low back pain 07/20/2015   Peroneal tendinitis of left lower leg 06/25/2015   Nonallopathic lesion of cervical region 06/25/2015   Wheezing 05/08/2015   Polyarthralgia 05/08/2015   Peripheral edema 03/16/2015   Central line complication 03/16/2015   Chemotherapy induced neutropenia (  HCC) 02/28/2015   Screening for colorectal cancer 01/05/2015   Chemotherapy induced nausea and vomiting 01/02/2015   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   Premature surgical menopause 12/17/2014    Leukopenia due to antineoplastic chemotherapy (HCC) 12/17/2014   Port-A-Cath in place 12/17/2014   Encounter for antineoplastic chemotherapy 12/17/2014   Myalgia 12/09/2014   Hypersensitivity reaction 12/05/2014   Poor venous access 11/28/2014   Postoperative cellulitis of surgical wound 11/24/2014   Malignant neoplasm of both ovaries 11/10/2014   Anxiety 11/10/2014   Adnexal mass 11/10/2014   Encounter for well adult exam with abnormal findings 09/10/2014   Hypothyroidism 09/10/2014   Hypersomnolence 09/10/2014   Acute non-recurrent frontal sinusitis 06/24/2014   Right knee pain 06/24/2014   Lower back pain 01/08/2014   Otalgia 12/18/2009   KNEE PAIN, RIGHT 12/18/2009   INGROWN TOENAIL 12/17/2008   Backache 12/17/2008   Acute bronchospasm 11/11/2008   Depression 11/29/2007   Insomnia 11/29/2007   Generalized anxiety disorder 04/13/2007   Allergic rhinitis 04/13/2007   GERD 04/13/2007   Endometriosis determined by laparoscopy 04/13/2007   Migraine headache 01/29/2007    Past Medical History:  Diagnosis Date   Anxiety    Arthritis    Asthma    Back pain    Barrett's esophagus    Chemotherapy-induced neuropathy (HCC)    Chronic lower back pain    Depression    Dyspnea    Elevated blood pressure, situational 05/05/2016   Endometriosis    Exercise-induced asthma 09/08/2015   Fatigue    Gallbladder problem    GERD (gastroesophageal reflux disease)    Heartburn    History of kidney stones    Hypertension    Hypothyroidism    Infertility, female    Joint pain    Joint stiffness    Lower extremity edema    Migraine    ovarian cancer    Ovarian cancer, bilateral (HCC) 11/28/2014   PONV (postoperative nausea and vomiting)    Sarcoidosis    Sleep apnea    SOBOE (shortness of breath on exertion)    Thyroid disease    Umbilical hernia    Vitamin D deficiency    woke up while porta cath removed     Family History  Problem Relation Age of Onset   Hyperlipidemia  Mother    Hypertension Mother    Diabetes Mother    Stroke Mother        Deceased   Heart disease Mother    Osteoarthritis Mother    Atrial fibrillation Father        Living   Stroke Father        optic nerve left eye   Healthy Brother    Diabetes Maternal Aunt    Heart failure Maternal Grandmother    Heart attack Maternal Grandfather    Dementia Paternal Grandmother    Dementia Paternal Grandfather    Past Surgical History:  Procedure Laterality Date   ABDOMINAL HYSTERECTOMY  11/10/2014   at [facility], Exp lap, supracervical hyst, BSO, infracolic omentectomy, lymphadenectomy, aortic lymph node sampling   BIOPSY  11/04/2021   Procedure: BIOPSY;  Surgeon: Lynann Bologna, MD;  Location: [facility];  Service: Gastroenterology;;   BRONCHIAL NEEDLE ASPIRATION BIOPSY  12/16/2020   Procedure: BRONCHIAL NEEDLE ASPIRATION BIOPSIES;  Surgeon: Oretha Milch, MD;  Location: [facility] END[facility]COPY;  Service: Cardiopulmonary;;   BRONCHIAL WASHINGS  12/16/2020   Procedure: BRONCHIAL WASHINGS;  Surgeon: Oretha Milch, MD;  Location: [facility] ENDOSCOPY;  Service: Cardiopulmonary;;  BUNIONECTOMY Left 09/14/2021   CARPAL TUNNEL RELEASE Left 09/07/2022   Procedure: LEFT CARPAL TUNNEL RELEASE, LEFT THUMB CARPOMETACARPAL INJECTION;  Surgeon: Tarry Kos, MD;  Location: [facility];  Service: Orthopedics;  Laterality: Left;   CARPAL TUNNEL RELEASE Right 10/12/2022   Procedure: RIGHT CARPAL TUNNEL RELEASE, RIGHT THUMB CARPOMETACARPAL INJECTION;  Surgeon: Tarry Kos, MD;  Location: [facility];  Service: Orthopedics;  Laterality: Right;   CHOLECYSTECTOMY N/A 07/24/2018   Procedure: LAPAROSCOPIC CHOLECYSTECTOMY WITH INTRAOPERATIVE CHOLANGIOGRAM;  Surgeon: Darnell Level, MD;  Location: [facility];  Service: General;  Laterality: N/A;   COLONOSCOPY WITH PROPOFOL N/A 11/04/2021   Procedure: COLONOSCOPY WITH PROPOFOL;  Surgeon: Lynann Bologna, MD;  Location: [facility];  Service: Gastroenterology;   Laterality: N/A;   ERCP N/A 07/26/2018   Procedure: ENDOSCOPIC RETROGRADE CHOLANGIOPANCREATOGRAPHY (ERCP);  Surgeon: Meryl Dare, MD;  Location: [facility];  Service: Endoscopy;  Laterality: N/A;   ESOPHAGOGASTRODUODENOSCOPY (EGD) WITH PROPOFOL N/A 11/04/2021   Procedure: ESOPHAGOGASTRODUODENOSCOPY (EGD) WITH PROPOFOL;  Surgeon: Lynann Bologna, MD;  Location: [facility];  Service: Gastroenterology;  Laterality: N/A;   GANGLION CYST EXCISION Right    hand   HYSTERECTOMY ABDOMINAL WITH SALPINGO-OOPHORECTOMY  11/10/2014   ovarian cancer, tumor removal   INCISIONAL HERNIA REPAIR N/A 12/25/2020   Procedure: LAPAROSCOPIC INCISIONAL HERNIA REPAIR WITH MESH;  Surgeon: Kinsinger, De Blanch, MD;  Location: [facility];  Service: General;  Laterality: N/A;   ingrown nail removal     IR GENERIC HISTORICAL  05/12/2016   IR REMOVAL TUN ACCESS W/ PORT W/O FL MOD SED 05/12/2016 [facility]   LAPAROSCOPIC ENDOMETRIOSIS FULGURATION     LIPOMA EXCISION Left    ankle   meniscal tear Right    MENISCUS REPAIR Right    oral sugery     POLYPECTOMY  11/04/2021   Procedure: POLYPECTOMY;  Surgeon: Lynann Bologna, MD;  Location: [facility];  Service: Gastroenterology;;   Shelda Pal a cath removal     PORTA CATH INSERTION     REMOVAL OF STONES  07/26/2018   Procedure: REMOVAL OF STONES;  Surgeon: Meryl Dare, MD;  Location: [facility];  Service: Endoscopy;;   SPHINCTEROTOMY  07/26/2018   Procedure: Dennison Mascot;  Surgeon: Meryl Dare, MD;  Location: [facility];  Service: Endoscopy;;   UMBILICAL HERNIA REPAIR  12/29/2020   VIDEO BRONCHOSCOPY WITH ENDOBRONCHIAL ULTRASOUND N/A 12/16/2020   Procedure: VIDEO BRONCHOSCOPY WITH ENDOBRONCHIAL ULTRASOUND;  Surgeon: Oretha Milch, MD;  Location: [facility] ENDOSCOPY;  Service: Cardiopulmonary;  Laterality: N/A;   Social History   Social History Narrative   Lives with boyfriend in a one story home.  Has no children.     Works in Marine scientist at [employer].     Education:  college.   Right handed    Drinks caffeine   Immunization History  Administered Date(s) Administered   Influenza,inj,Quad PF,6+ Mos 01/02/2014, 03/26/2015, 12/25/2017, 01/21/2020, 01/25/2021   Influenza-Unspecified 02/23/2016, 02/09/2017, 02/12/2020, 02/14/2022   MMR 02/09/2017, 07/03/2017   PFIZER(Purple Top)SARS-COV-2 Vaccination 08/01/2019, 08/22/2019, 02/22/2020   Tdap 11/07/2017   Tetanus 12/21/2015   Zoster Recombinant(Shingrix) 06/22/2021, 11/26/2021     Objective: Vital Signs: LMP 11/02/2014    Physical Exam Vitals and nursing note reviewed.  Constitutional:      Appearance: She is well-developed.  HENT:     Head: Normocephalic and atraumatic.  Eyes:     Conjunctiva/sclera: Conjunctivae normal.  Cardiovascular:     Rate and Rhythm: Normal rate and regular rhythm.  Heart sounds: Normal heart sounds.  Pulmonary:     Effort: Pulmonary effort is normal.     Breath sounds: Normal breath sounds.  Abdominal:     General: Bowel sounds are normal.     Palpations: Abdomen is soft.  Musculoskeletal:     Cervical back: Normal range of motion.  Lymphadenopathy:     Cervical: No cervical adenopathy.  Skin:    General: Skin is warm and dry.     Capillary Refill: Capillary refill takes less than 2 seconds.  Neurological:     Mental Status: She is alert and oriented to person, place, and time.  Psychiatric:        Behavior: Behavior normal.      Musculoskeletal Exam: ***  CDAI Exam: CDAI Score: -- Patient Global: --; Provider Global: -- Swollen: --; Tender: -- Joint Exam 05/01/2023   No joint exam has been documented for this visit   There is currently no information documented on the homunculus. Go to the Rheumatology activity and complete the homunculus joint exam.  Investigation: No additional findings.  Imaging: No results found.  Recent Labs: Lab Results  Component Value Date   WBC 11.3 (H) 11/09/2022   HGB 14.0 11/09/2022   PLT 325 11/09/2022    NA 139 11/09/2022   K 4.1 11/09/2022   CL 102 11/09/2022   CO2 29 11/09/2022   GLUCOSE 110 (H) 11/09/2022   BUN 15 11/09/2022   CREATININE 0.74 11/09/2022   BILITOT 0.7 11/09/2022   ALKPHOS 68 11/16/2021   AST 13 11/09/2022   ALT 15 11/09/2022   PROT 7.2 11/09/2022   ALBUMIN 4.5 11/16/2021   CALCIUM 9.7 11/09/2022   GFRAA 110 11/11/2020   QFTBGOLDPLUS NEGATIVE 11/11/2020    Speciality Comments: No specialty comments available.  Procedures:  No procedures performed Allergies: Moxifloxacin, Quinolones, Sulfonamide derivatives, Doxycycline, Escitalopram oxalate, and Lisinopril   Assessment / Plan:     Visit Diagnoses: Sarcoidosis  Positive ANA (antinuclear antibody)  High risk medication use  Sialolithiasis  Primary osteoarthritis of both hands  Primary osteoarthritis of both knees  Bilateral carpal tunnel syndrome  S/P bunionectomy  Right lumbar radiculopathy  Malignant neoplasm of both ovaries (HCC)  Chemotherapy-induced peripheral neuropathy (HCC)  Primary hypertension  Acquired hypothyroidism  History of gastroesophageal reflux (GERD)  Migraine without aura and with status migrainosus, not intractable  Mild intermittent asthma with acute exacerbation  OSA on CPAP  Other fatigue  Surgical menopause  Multiple lung nodules  TB lung, latent  Orders: No orders of the defined types were placed in this encounter.  No orders of the defined types were placed in this encounter.   Face-to-face time spent with patient was *** minutes. Greater than 50% of time was spent in counseling and coordination of care.  Follow-Up Instructions: No follow-ups on file.   Gearldine Bienenstock, PA-C  Note - This record has been created using Dragon software.  Chart creation errors have been sought, but may not always  have been located. Such creation errors do not reflect on  the standard of medical care.

## 2023-05-01 ENCOUNTER — Other Ambulatory Visit: Payer: Self-pay | Admitting: Internal Medicine

## 2023-05-01 ENCOUNTER — Other Ambulatory Visit: Payer: Self-pay

## 2023-05-01 ENCOUNTER — Other Ambulatory Visit (HOSPITAL_BASED_OUTPATIENT_CLINIC_OR_DEPARTMENT_OTHER): Payer: Self-pay

## 2023-05-01 ENCOUNTER — Ambulatory Visit: Payer: Commercial Managed Care - PPO | Admitting: Physician Assistant

## 2023-05-01 DIAGNOSIS — Z227 Latent tuberculosis: Secondary | ICD-10-CM

## 2023-05-01 DIAGNOSIS — K115 Sialolithiasis: Secondary | ICD-10-CM

## 2023-05-01 DIAGNOSIS — E039 Hypothyroidism, unspecified: Secondary | ICD-10-CM

## 2023-05-01 DIAGNOSIS — G620 Drug-induced polyneuropathy: Secondary | ICD-10-CM

## 2023-05-01 DIAGNOSIS — R918 Other nonspecific abnormal finding of lung field: Secondary | ICD-10-CM

## 2023-05-01 DIAGNOSIS — R768 Other specified abnormal immunological findings in serum: Secondary | ICD-10-CM

## 2023-05-01 DIAGNOSIS — Z8719 Personal history of other diseases of the digestive system: Secondary | ICD-10-CM

## 2023-05-01 DIAGNOSIS — C563 Malignant neoplasm of bilateral ovaries: Secondary | ICD-10-CM

## 2023-05-01 DIAGNOSIS — M170 Bilateral primary osteoarthritis of knee: Secondary | ICD-10-CM

## 2023-05-01 DIAGNOSIS — M19041 Primary osteoarthritis, right hand: Secondary | ICD-10-CM

## 2023-05-01 DIAGNOSIS — R5383 Other fatigue: Secondary | ICD-10-CM

## 2023-05-01 DIAGNOSIS — G5603 Carpal tunnel syndrome, bilateral upper limbs: Secondary | ICD-10-CM

## 2023-05-01 DIAGNOSIS — E8940 Asymptomatic postprocedural ovarian failure: Secondary | ICD-10-CM

## 2023-05-01 DIAGNOSIS — I10 Essential (primary) hypertension: Secondary | ICD-10-CM

## 2023-05-01 DIAGNOSIS — D869 Sarcoidosis, unspecified: Secondary | ICD-10-CM

## 2023-05-01 DIAGNOSIS — G4733 Obstructive sleep apnea (adult) (pediatric): Secondary | ICD-10-CM

## 2023-05-01 DIAGNOSIS — J4521 Mild intermittent asthma with (acute) exacerbation: Secondary | ICD-10-CM

## 2023-05-01 DIAGNOSIS — M5416 Radiculopathy, lumbar region: Secondary | ICD-10-CM

## 2023-05-01 DIAGNOSIS — Z9889 Other specified postprocedural states: Secondary | ICD-10-CM

## 2023-05-01 DIAGNOSIS — Z79899 Other long term (current) drug therapy: Secondary | ICD-10-CM

## 2023-05-01 DIAGNOSIS — G43001 Migraine without aura, not intractable, with status migrainosus: Secondary | ICD-10-CM

## 2023-05-01 MED ORDER — AMPHETAMINE-DEXTROAMPHET ER 20 MG PO CP24
20.0000 mg | ORAL_CAPSULE | Freq: Every day | ORAL | 0 refills | Status: DC
  Filled 2023-05-01: qty 30, 30d supply, fill #0

## 2023-05-08 NOTE — Progress Notes (Signed)
 Office Visit Note  Patient: [name]             Date of Birth: June 02, 1970           MRN: [id number]             PCP: Norleen Lynwood ORN, MD Referring: Norleen Lynwood ORN, MD Visit Date: 05/12/2023 Occupation: @GUAROCC @  Subjective:  Pain in multiple joints   History of Present Illness: [name] is a 52 y.o. female with history of sarcoidosis and osteoarthritis.  Patient remains on  Methotrexate  0.8 mL sq injections once weekly and folic acid  2mg  by mouth daily.  She is tolerating methotrexate  without any side effects or injection site reactions.  Patient sent a message via MyChart on 03/17/2023 questioning if she could initiated on low-dose prednisone  daily instead of methotrexate .  Around that time she had been recovering from COVID and took prednisone .  Her energy level and joint pain improved significantly while taking prednisone .  She found prednisone  to be more effective than methotrexate  at managing her day-to-day symptoms.  She denies any other recent gaps in therapy.  She continues to have chronic pain involving multiple joints.  She has had increased discomfort in the thoracic spine, lumbar spine, both hips, and both knee joints.  She has noticed radiating pain down her right buttocks to behind her right knee especially if sitting for prolonged periods of time.  She is also had increased discomfort in her hands especially the left first MCP joint.  She has warmth and swelling in the right knee intermittently.  Patient would like to discuss other treatment options. She denies any signs or symptoms of uveitis flare.  She denies any new or worsening pulmonary symptoms.  She does feel winded when going up steps.  Activities of Daily Living:  Patient reports morning stiffness for 5 minutes  Patient Reports nocturnal pain.  Difficulty dressing/grooming: Denies Difficulty climbing stairs: Reports Difficulty getting out of chair: Reports Difficulty using hands for taps, buttons, cutlery,  and/or writing: Reports  Review of Systems  Constitutional:  Positive for fatigue.  HENT:  Negative for mouth sores, mouth dryness and nose dryness.   Eyes:  Negative for pain, visual disturbance and dryness.  Respiratory:  Negative for cough, hemoptysis, shortness of breath and difficulty breathing.   Cardiovascular:  Negative for chest pain, palpitations, hypertension and swelling in legs/feet.  Gastrointestinal:  Negative for blood in stool, constipation and diarrhea.  Endocrine: Negative for increased urination.  Genitourinary:  Negative for painful urination.  Musculoskeletal:  Positive for joint pain, joint pain, joint swelling and morning stiffness. Negative for myalgias, muscle weakness, muscle tenderness and myalgias.  Skin:  Negative for color change, pallor, rash, hair loss, nodules/bumps, skin tightness, ulcers and sensitivity to sunlight.  Allergic/Immunologic: Negative for susceptible to infections.  Neurological:  Negative for dizziness, numbness, headaches and weakness.  Hematological:  Negative for swollen glands.  Psychiatric/Behavioral:  Negative for depressed mood and sleep disturbance. The patient is not nervous/anxious.     PMFS History:  Patient Active Problem List   Diagnosis Date Noted   Carpal tunnel syndrome on right 10/12/2022   Ganglion cyst of flexor tendon sheath of finger of right hand 10/12/2022   Primary osteoarthritis of first carpometacarpal joint of right hand 10/12/2022   Adjustment disorder with mixed anxiety and depressed mood 09/12/2022   Carpal tunnel syndrome on left 09/07/2022   Eating disorder 08/11/2022   Blurry vision, bilateral 06/28/2022   Primary osteoarthritis of first carpometacarpal  joint of left hand 06/28/2022   Hoarseness 06/18/2022   Insulin  resistance 05/18/2022   Major depressive disorder 03/09/2022   Polyphagia 03/09/2022   Left foot pain 02/16/2022   Osteoarthritis of both knees 01/13/2022   Hot flashes due to menopause  12/19/2021   ADD (attention deficit disorder) 12/19/2021   SOBOE (shortness of breath on exertion) 11/16/2021   Elevated glucose 11/16/2021   Vitamin D  deficiency 11/16/2021   Prediabetes 11/16/2021   Bunion, left 08/06/2021   Morbid obesity (HCC) with initial BMI 52 06/26/2021   Venous insufficiency 06/25/2021   Incisional hernia 12/25/2020   Mediastinal lymphadenopathy    Pulmonary nodules 10/20/2020   Multiple skin nodules 07/24/2020   Right lumbar radiculopathy 05/18/2020   Periumbilical hernia 05/18/2020   TB lung, latent 05/18/2020   Acute non-recurrent maxillary sinusitis 11/09/2019   HTN (hypertension) 04/14/2019   Mass of left wrist 04/14/2019   External otitis of right ear 04/14/2019   Choledocholithiasis    Abnormal cholangiogram    Cholecystitis with cholelithiasis 07/24/2018   Hot flash not due to menopause 10/16/2017   Eczema 10/16/2017   Flu-like symptoms 06/20/2017   Cognitive changes 01/30/2017   Disturbed concentration 01/26/2017   Left shoulder pain 11/01/2016   Right low back pain 11/01/2016   Elevated blood pressure, situational 05/05/2016   Surgical menopause 01/29/2016   OSA on CPAP 12/22/2015   Cough 11/26/2015   Capsulitis 10/06/2015   Ganglion cyst of left foot 10/06/2015   Fatigue 09/13/2015   Asthma 09/08/2015   Exertional dyspnea 09/08/2015   Peroneal ganglion cyst 07/20/2015   Low back pain 07/20/2015   Peroneal tendinitis of left lower leg 06/25/2015   Nonallopathic lesion of cervical region 06/25/2015   Wheezing 05/08/2015   Polyarthralgia 05/08/2015   Peripheral edema 03/16/2015   Central line complication 03/16/2015   Chemotherapy induced neutropenia (HCC) 02/28/2015   Screening for colorectal cancer 01/05/2015   Chemotherapy induced nausea and vomiting 01/02/2015   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   Premature surgical menopause 12/17/2014   Leukopenia due to antineoplastic chemotherapy (HCC) 12/17/2014    Port-A-Cath in place 12/17/2014   Encounter for antineoplastic chemotherapy 12/17/2014   Myalgia 12/09/2014   Hypersensitivity reaction 12/05/2014   Poor venous access 11/28/2014   Postoperative cellulitis of surgical wound 11/24/2014   Malignant neoplasm of both ovaries 11/10/2014   Anxiety 11/10/2014   Adnexal mass 11/10/2014   Encounter for well adult exam with abnormal findings 09/10/2014   Hypothyroidism 09/10/2014   Hypersomnolence 09/10/2014   Acute non-recurrent frontal sinusitis 06/24/2014   Right knee pain 06/24/2014   Lower back pain 01/08/2014   Otalgia 12/18/2009   KNEE PAIN, RIGHT 12/18/2009   INGROWN TOENAIL 12/17/2008   Backache 12/17/2008   Acute bronchospasm 11/11/2008   Depression 11/29/2007   Insomnia 11/29/2007   Generalized anxiety disorder 04/13/2007   Allergic rhinitis 04/13/2007   GERD 04/13/2007   Endometriosis determined by laparoscopy 04/13/2007   Migraine headache 01/29/2007    Past Medical History:  Diagnosis Date   Anxiety    Arthritis    Asthma    Back pain    Barrett's esophagus    Chemotherapy-induced neuropathy (HCC)    Chronic lower back pain    Depression    Dyspnea    Elevated blood pressure, situational 05/05/2016   Endometriosis    Exercise-induced asthma 09/08/2015   Fatigue    Gallbladder problem    GERD (gastroesophageal reflux disease)    Heartburn    History of  kidney stones    Hypertension    Hypothyroidism    Infertility, female    Joint pain    Joint stiffness    Lower extremity edema    Migraine    ovarian cancer    Ovarian cancer, bilateral (HCC) 11/28/2014   PONV (postoperative nausea and vomiting)    Sarcoidosis    Sleep apnea    SOBOE (shortness of breath on exertion)    Thyroid  disease    Umbilical hernia    Vitamin D  deficiency    woke up while porta cath removed     Family History  Problem Relation Age of Onset   Hyperlipidemia Mother    Hypertension Mother    Diabetes Mother    Stroke  Mother        Deceased   Heart disease Mother    Osteoarthritis Mother    Atrial fibrillation Father        Living   Stroke Father        optic nerve left eye   Healthy Brother    Diabetes Maternal Aunt    Heart failure Maternal Grandmother    Heart attack Maternal Grandfather    Dementia Paternal Grandmother    Dementia Paternal Grandfather    Past Surgical History:  Procedure Laterality Date   ABDOMINAL HYSTERECTOMY  11/10/2014   at [facility], Exp lap, supracervical hyst, BSO, infracolic omentectomy, lymphadenectomy, aortic lymph node sampling   BIOPSY  11/04/2021   Procedure: BIOPSY;  Surgeon: Charlanne Groom, MD;  Location: [facility];  Service: Gastroenterology;;   BRONCHIAL NEEDLE ASPIRATION BIOPSY  12/16/2020   Procedure: BRONCHIAL NEEDLE ASPIRATION BIOPSIES;  Surgeon: Jude Harden GAILS, MD;  Location: [facility];  Service: Cardiopulmonary;;   BRONCHIAL WASHINGS  12/16/2020   Procedure: BRONCHIAL WASHINGS;  Surgeon: Jude Harden GAILS, MD;  Location: [facility] ENDOSCOPY;  Service: Cardiopulmonary;;   BUNIONECTOMY Left 09/14/2021   CARPAL TUNNEL RELEASE Left 09/07/2022   Procedure: LEFT CARPAL TUNNEL RELEASE, LEFT THUMB CARPOMETACARPAL INJECTION;  Surgeon: Jerri Kay HERO, MD;  Location: [facility];  Service: Orthopedics;  Laterality: Left;   CARPAL TUNNEL RELEASE Right 10/12/2022   Procedure: RIGHT CARPAL TUNNEL RELEASE, RIGHT THUMB CARPOMETACARPAL INJECTION;  Surgeon: Jerri Kay HERO, MD;  Location: [facility];  Service: Orthopedics;  Laterality: Right;   CHOLECYSTECTOMY N/A 07/24/2018   Procedure: LAPAROSCOPIC CHOLECYSTECTOMY WITH INTRAOPERATIVE CHOLANGIOGRAM;  Surgeon: Eletha Boas, MD;  Location: [facility];  Service: General;  Laterality: N/A;   COLONOSCOPY WITH PROPOFOL  N/A 11/04/2021   Procedure: COLONOSCOPY WITH PROPOFOL ;  Surgeon: Charlanne Groom, MD;  Location: [facility];  Service: Gastroenterology;  Laterality: N/A;   ERCP N/A 07/26/2018   Procedure: ENDOSCOPIC  RETROGRADE CHOLANGIOPANCREATOGRAPHY (ERCP);  Surgeon: Aneita Gwendlyn DASEN, MD;  Location: [facility];  Service: Endoscopy;  Laterality: N/A;   ESOPHAGOGASTRODUODENOSCOPY (EGD) WITH PROPOFOL  N/A 11/04/2021   Procedure: ESOPHAGOGASTRODUODENOSCOPY (EGD) WITH PROPOFOL ;  Surgeon: Charlanne Groom, MD;  Location: [facility];  Service: Gastroenterology;  Laterality: N/A;   GANGLION CYST EXCISION Right    hand   HYSTERECTOMY ABDOMINAL WITH SALPINGO-OOPHORECTOMY  11/10/2014   ovarian cancer, tumor removal   INCISIONAL HERNIA REPAIR N/A 12/25/2020   Procedure: LAPAROSCOPIC INCISIONAL HERNIA REPAIR WITH MESH;  Surgeon: Kinsinger, Herlene Righter, MD;  Location: [facility];  Service: General;  Laterality: N/A;   ingrown nail removal     IR GENERIC HISTORICAL  05/12/2016   IR REMOVAL TUN ACCESS W/ PORT W/O FL MOD SED 05/12/2016 [facility]   LAPAROSCOPIC ENDOMETRIOSIS FULGURATION  LIPOMA EXCISION Left    ankle   meniscal tear Right    MENISCUS REPAIR Right    oral sugery     POLYPECTOMY  11/04/2021   Procedure: POLYPECTOMY;  Surgeon: Charlanne Groom, MD;  Location: [facility];  Service: Gastroenterology;;   Pat a cath removal     PORTA CATH INSERTION     REMOVAL OF STONES  07/26/2018   Procedure: REMOVAL OF STONES;  Surgeon: Aneita Gwendlyn DASEN, MD;  Location: [facility];  Service: Endoscopy;;   SPHINCTEROTOMY  07/26/2018   Procedure: ANNETT;  Surgeon: Aneita Gwendlyn DASEN, MD;  Location: [facility];  Service: Endoscopy;;   UMBILICAL HERNIA REPAIR  12/29/2020   VIDEO BRONCHOSCOPY WITH ENDOBRONCHIAL ULTRASOUND N/A 12/16/2020   Procedure: VIDEO BRONCHOSCOPY WITH ENDOBRONCHIAL ULTRASOUND;  Surgeon: Jude Harden GAILS, MD;  Location: [facility];  Service: Cardiopulmonary;  Laterality: N/A;   Social History   Social History Narrative   Lives with boyfriend in a one story home.  Has no children.     Works in marine scientist at [employer].     Education: college.   Right handed    Drinks caffeine   Immunization  History  Administered Date(s) Administered   Influenza,inj,Quad PF,6+ Mos 01/02/2014, 03/26/2015, 12/25/2017, 01/21/2020, 01/25/2021   Influenza-Unspecified 02/23/2016, 02/09/2017, 02/12/2020, 02/14/2022   MMR 02/09/2017, 07/03/2017   PFIZER(Purple Top)SARS-COV-2 Vaccination 08/01/2019, 08/22/2019, 02/22/2020   Tdap 11/07/2017   Tetanus 12/21/2015   Zoster Recombinant(Shingrix ) 06/22/2021, 11/26/2021     Objective: Vital Signs: BP (!) 141/84 (BP Location: Left Arm, Patient Position: Sitting)   Pulse 87   Resp 15   Ht 5' 7 (1.702 m)   Wt (!) 320 lb (145.2 kg)   LMP 11/02/2014   BMI 50.12 kg/m    Physical Exam Vitals and nursing note reviewed.  Constitutional:      Appearance: She is well-developed.  HENT:     Head: Normocephalic and atraumatic.  Eyes:     Conjunctiva/sclera: Conjunctivae normal.  Cardiovascular:     Rate and Rhythm: Normal rate and regular rhythm.     Heart sounds: Normal heart sounds.  Pulmonary:     Effort: Pulmonary effort is normal.     Breath sounds: Normal breath sounds.  Abdominal:     General: Bowel sounds are normal.     Palpations: Abdomen is soft.  Musculoskeletal:     Cervical back: Normal range of motion.  Lymphadenopathy:     Cervical: No cervical adenopathy.  Skin:    General: Skin is warm and dry.     Capillary Refill: Capillary refill takes less than 2 seconds.  Neurological:     Mental Status: She is alert and oriented to person, place, and time.  Psychiatric:        Behavior: Behavior normal.      Musculoskeletal Exam: C-spine has slightly limited range of motion without rotation.  Midline spinal tenderness in the thoracic region.  Midline spinal tenderness in the lumbar region and over the right SI joint.  Shoulder joints, elbow joints, wrist joints, MCPs, PIPs, DIPs have good range of motion with no synovitis.  Tenderness of the left first MCP joint.  Complete fist formation noted bilaterally.  Painful range of motion of the  right knee with warmth.  No warmth or effusion noted in the left knee.  Pedal edema noted in bilateral lower extremities.    CDAI Exam: CDAI Score: -- Patient Global: --; Provider Global: -- Swollen: --; Tender: -- Joint Exam 05/12/2023   No  joint exam has been documented for this visit   There is currently no information documented on the homunculus. Go to the Rheumatology activity and complete the homunculus joint exam.  Investigation: No additional findings.  Imaging: No results found.  Recent Labs: Lab Results  Component Value Date   WBC 11.3 (H) 11/09/2022   HGB 14.0 11/09/2022   PLT 325 11/09/2022   NA 139 11/09/2022   K 4.1 11/09/2022   CL 102 11/09/2022   CO2 29 11/09/2022   GLUCOSE 110 (H) 11/09/2022   BUN 15 11/09/2022   CREATININE 0.74 11/09/2022   BILITOT 0.7 11/09/2022   ALKPHOS 68 11/16/2021   AST 13 11/09/2022   ALT 15 11/09/2022   PROT 7.2 11/09/2022   ALBUMIN 4.5 11/16/2021   CALCIUM 9.7 11/09/2022   GFRAA 110 11/11/2020   QFTBGOLDPLUS NEGATIVE 11/11/2020    Speciality Comments: No specialty comments available.  Procedures:  No procedures performed Allergies: Moxifloxacin, Quinolones, Sulfonamide derivatives, Doxycycline, Escitalopram  oxalate, and Lisinopril      Assessment / Plan:     Visit Diagnoses: Sarcoidosis: Progression of mediastinal and hilar LN on the CT scan which improved after prednisone , subjective hx of EN lesions, R parotid swelling. Previously followed by Dr. Jude: Patient is currently on methotrexate  0.8 mL sq injections once weekly and folic acid  2 mg daily.  She continues to experience significant joint pain involving multiple joints.  On examination today she has warmth of the right knee but no other joint inflammation was noted.  Her joint pain is highly responsive to the use of prednisone  but Dr. Dolphus and I both discouraged the long-term use of prednisone . On 03/17/23 Dr. Dolphus mentioned the use of IV remicade for  management of sarcoidosis if symptoms were refractory to the use of Methotrexate .   Plan to obtain the following lab work today to assess for active inflammation.  She had no obvious synovitis on examination today. Chest x-ray on 11/15/2022: No radiographic evidence of acute cardiopulmonary disease.  No pulmonary nodule or mass noted.  Pulmonary vasculature and cardiomediastinal silhouette within normal limits.  No new or worsening pulmonary symptoms.  She has not been evaluated by Dr. Jude recently-encouraged to schedule a follow-up visit if she has new or worsening symptoms. No signs or symptoms of uveitis.  No conjunctival injection noted. Patient will remain on methotrexate  0.8 mL sq injections once weekly and folic acid  2 mg daily.  We will hold off on adding IV Remicade at this time.  Plan to refer the patient to pain management to help better alleviate her chronic pain.  She will follow up in 5 months or sooner if needed.  Positive ANA (antinuclear antibody): Positive ANA, positive sicca symptoms and oral ulcers. AVISE lupus index -1.1, ANA 1: 160NS, antiphosphatidylserine IgM positive.    High risk medication use: Methotrexate  0.8 mL sq injections once weekly and folic acid  2mg  by mouth daily.  CBC and CMP updated on 11/09/22. Orders for CBC and CMP released today.   No recurrent infections. Discussed the importance of holding methotrexate  if she develops signs or symptoms of an infection and to resume once the infection has completely cleared.    Sialolithiasis: Maxillofacial CT obtained on 03/27/2021: Subtle inflammation of the right parotid gland compatible with mild sialoadenitis.   Primary osteoarthritis of both hands:  Patient underwent carpal tunnel release in the left wrist in May 2024 and the right wrist in June 2024 performed by Dr. Jerri.   Patient continues to have chronic pain  and stiffness involving both hands.  No synovitis noted on examination today.  Tenderness of the left 1st MCP  joint noted.    Bilateral carpal tunnel syndrome:  Patient underwent carpal tunnel release in the left wrist in May 2024 and the right wrist in June 2024 performed by Dr. Jerri.    S/P bunionectomy: left-Performed by Dr. Gershon on 09/08/21.   Primary osteoarthritis of both knees: Patient has chronic pain involving both knee joints, right greater than left.  On examination today she has painful range of motion of both knees with warmth in the right knee. A referral to pain management will be placed today.   Right lumbar radiculopathy: Patient continues to experience persistent discomfort in her lower back with intermittent symptoms of right-sided radiculopathy.  Her symptoms are exacerbated after sitting for prolonged periods of time.  She has difficulty being active due to the severity of pain in her lower back, hips, and knees.  Referral to pain management will be placed today.  Other fatigue: Patient continues to have chronic fatigue which she attributes to interrupted sleep at night.  She has been experiencing nocturnal pain which contributes to her insomnia.  She has been unable to exercise due to severity of discomfort she experiences in her lower back, hips, and both knees.  Other medical conditions are listed as follows:  Malignant neoplasm of both ovaries (HCC): 11/09/2014 TAHBSO, CTX X 6 months. Previously received clearance to initiate immunosuppressive therapy.   Chemotherapy-induced peripheral neuropathy (HCC): Followed by Dr. Tobie.  Referral to pain management was placed today.  Primary hypertension: Blood pressure was 141/84 today in the office.  Acquired hypothyroidism  Migraine without aura and with status migrainosus, not intractable  Mild intermittent asthma with acute exacerbation  OSA on CPAP  History of gastroesophageal reflux (GERD)  Surgical menopause  Multiple lung nodules  TB lung, latent  Diabetes mellitus screening: Patient requested to have hemoglobin A1c  checked today.  Orders: Orders Placed This Encounter  Procedures   CBC with Differential/Platelet   COMPLETE METABOLIC PANEL WITH GFR   HgB J8r   Sedimentation rate   C-reactive protein   Angiotensin converting enzyme   Serum protein electrophoresis with reflex   Ambulatory referral to Physical Medicine Rehab   No orders of the defined types were placed in this encounter.   Follow-Up Instructions: Return in about 5 months (around 10/10/2023) for Sarcoidosis, Osteoarthritis.   Waddell CHRISTELLA Craze, PA-C  Note - This record has been created using Dragon software.  Chart creation errors have been sought, but may not always  have been located. Such creation errors do not reflect on  the standard of medical care.

## 2023-05-12 ENCOUNTER — Encounter: Payer: Self-pay | Admitting: Physician Assistant

## 2023-05-12 ENCOUNTER — Ambulatory Visit: Payer: Commercial Managed Care - PPO | Attending: Physician Assistant | Admitting: Physician Assistant

## 2023-05-12 VITALS — BP 141/84 | HR 87 | Resp 15 | Ht 67.0 in | Wt 320.0 lb

## 2023-05-12 DIAGNOSIS — M19041 Primary osteoarthritis, right hand: Secondary | ICD-10-CM

## 2023-05-12 DIAGNOSIS — K115 Sialolithiasis: Secondary | ICD-10-CM

## 2023-05-12 DIAGNOSIS — Z8719 Personal history of other diseases of the digestive system: Secondary | ICD-10-CM

## 2023-05-12 DIAGNOSIS — R5383 Other fatigue: Secondary | ICD-10-CM | POA: Diagnosis not present

## 2023-05-12 DIAGNOSIS — I10 Essential (primary) hypertension: Secondary | ICD-10-CM

## 2023-05-12 DIAGNOSIS — R768 Other specified abnormal immunological findings in serum: Secondary | ICD-10-CM

## 2023-05-12 DIAGNOSIS — G620 Drug-induced polyneuropathy: Secondary | ICD-10-CM

## 2023-05-12 DIAGNOSIS — Z9889 Other specified postprocedural states: Secondary | ICD-10-CM | POA: Diagnosis not present

## 2023-05-12 DIAGNOSIS — T451X5A Adverse effect of antineoplastic and immunosuppressive drugs, initial encounter: Secondary | ICD-10-CM

## 2023-05-12 DIAGNOSIS — R918 Other nonspecific abnormal finding of lung field: Secondary | ICD-10-CM

## 2023-05-12 DIAGNOSIS — D869 Sarcoidosis, unspecified: Secondary | ICD-10-CM | POA: Diagnosis not present

## 2023-05-12 DIAGNOSIS — Z227 Latent tuberculosis: Secondary | ICD-10-CM

## 2023-05-12 DIAGNOSIS — J4521 Mild intermittent asthma with (acute) exacerbation: Secondary | ICD-10-CM

## 2023-05-12 DIAGNOSIS — G4733 Obstructive sleep apnea (adult) (pediatric): Secondary | ICD-10-CM

## 2023-05-12 DIAGNOSIS — Z79899 Other long term (current) drug therapy: Secondary | ICD-10-CM

## 2023-05-12 DIAGNOSIS — Z131 Encounter for screening for diabetes mellitus: Secondary | ICD-10-CM | POA: Diagnosis not present

## 2023-05-12 DIAGNOSIS — G5603 Carpal tunnel syndrome, bilateral upper limbs: Secondary | ICD-10-CM

## 2023-05-12 DIAGNOSIS — C563 Malignant neoplasm of bilateral ovaries: Secondary | ICD-10-CM

## 2023-05-12 DIAGNOSIS — M170 Bilateral primary osteoarthritis of knee: Secondary | ICD-10-CM | POA: Diagnosis not present

## 2023-05-12 DIAGNOSIS — G43001 Migraine without aura, not intractable, with status migrainosus: Secondary | ICD-10-CM

## 2023-05-12 DIAGNOSIS — M19042 Primary osteoarthritis, left hand: Secondary | ICD-10-CM

## 2023-05-12 DIAGNOSIS — M5416 Radiculopathy, lumbar region: Secondary | ICD-10-CM

## 2023-05-12 DIAGNOSIS — E039 Hypothyroidism, unspecified: Secondary | ICD-10-CM

## 2023-05-12 DIAGNOSIS — E8940 Asymptomatic postprocedural ovarian failure: Secondary | ICD-10-CM

## 2023-05-14 NOTE — Progress Notes (Signed)
 Glucose is 127. Rest of CMP WNL  HgbA1c 5.8%-please notify the patient.   ESR WNL CRP WNL  CBC WNL

## 2023-05-15 NOTE — Progress Notes (Signed)
ACE WNL

## 2023-05-16 LAB — COMPLETE METABOLIC PANEL WITH GFR
AG Ratio: 1.6 (calc) (ref 1.0–2.5)
ALT: 15 U/L (ref 6–29)
AST: 14 U/L (ref 10–35)
Albumin: 4.2 g/dL (ref 3.6–5.1)
Alkaline phosphatase (APISO): 61 U/L (ref 37–153)
BUN: 12 mg/dL (ref 7–25)
CO2: 28 mmol/L (ref 20–32)
Calcium: 9.1 mg/dL (ref 8.6–10.4)
Chloride: 104 mmol/L (ref 98–110)
Creat: 0.8 mg/dL (ref 0.50–1.03)
Globulin: 2.7 g/dL (ref 1.9–3.7)
Glucose, Bld: 127 mg/dL — ABNORMAL HIGH (ref 65–99)
Potassium: 3.8 mmol/L (ref 3.5–5.3)
Sodium: 141 mmol/L (ref 135–146)
Total Bilirubin: 0.7 mg/dL (ref 0.2–1.2)
Total Protein: 6.9 g/dL (ref 6.1–8.1)
eGFR: 89 mL/min/{1.73_m2} (ref >=60)

## 2023-05-16 LAB — CBC WITH DIFFERENTIAL/PLATELET
Absolute Lymphocytes: 2049 {cells}/uL (ref 850–3900)
Absolute Monocytes: 408 {cells}/uL (ref 200–950)
Basophils Absolute: 43 {cells}/uL (ref 0–200)
Basophils Relative: 0.5 %
Eosinophils Absolute: 111 {cells}/uL (ref 15–500)
Eosinophils Relative: 1.3 %
HCT: 41.1 % (ref 35.0–45.0)
Hemoglobin: 13.7 g/dL (ref 11.7–15.5)
MCH: 30 pg (ref 27.0–33.0)
MCHC: 33.3 g/dL (ref 32.0–36.0)
MCV: 90.1 fL (ref 80.0–100.0)
MPV: 10.2 fL (ref 7.5–12.5)
Monocytes Relative: 4.8 %
Neutro Abs: 5891 {cells}/uL (ref 1500–7800)
Neutrophils Relative %: 69.3 %
Platelets: 291 10*3/uL (ref 140–400)
RBC: 4.56 10*6/uL (ref 3.80–5.10)
RDW: 12.7 % (ref 11.0–15.0)
Total Lymphocyte: 24.1 %
WBC: 8.5 10*3/uL (ref 3.8–10.8)

## 2023-05-16 LAB — HEMOGLOBIN A1C
Hgb A1c MFr Bld: 5.8 %{Hb} — ABNORMAL HIGH (ref <5.7)
Mean Plasma Glucose: 120 mg/dL
eAG (mmol/L): 6.6 mmol/L

## 2023-05-16 LAB — C-REACTIVE PROTEIN: CRP: 4.3 mg/L (ref <8.0)

## 2023-05-16 LAB — PROTEIN ELECTROPHORESIS, SERUM, WITH REFLEX
Albumin ELP: 4.2 g/dL (ref 3.8–4.8)
Alpha 1: 0.3 g/dL (ref 0.2–0.3)
Alpha 2: 0.6 g/dL (ref 0.5–0.9)
Beta 2: 0.4 g/dL (ref 0.2–0.5)
Beta Globulin: 0.5 g/dL (ref 0.4–0.6)
Gamma Globulin: 1.1 g/dL (ref 0.8–1.7)
Total Protein: 7 g/dL (ref 6.1–8.1)

## 2023-05-16 LAB — ANGIOTENSIN CONVERTING ENZYME: Angiotensin-Converting Enzyme: 48 U/L (ref 9–67)

## 2023-05-16 LAB — SEDIMENTATION RATE: Sed Rate: 11 mm/h (ref 0–30)

## 2023-05-16 NOTE — Progress Notes (Signed)
SPEP did not reveal any abnormal protein bands.

## 2023-05-22 ENCOUNTER — Other Ambulatory Visit: Payer: Self-pay | Admitting: Neurology

## 2023-05-22 ENCOUNTER — Other Ambulatory Visit: Payer: Self-pay

## 2023-05-23 ENCOUNTER — Ambulatory Visit: Payer: Commercial Managed Care - PPO | Admitting: Neurology

## 2023-05-23 ENCOUNTER — Encounter: Payer: Self-pay | Admitting: Neurology

## 2023-05-23 ENCOUNTER — Other Ambulatory Visit (HOSPITAL_BASED_OUTPATIENT_CLINIC_OR_DEPARTMENT_OTHER): Payer: Self-pay

## 2023-05-23 VITALS — BP 121/68 | HR 92 | Ht 67.0 in | Wt 325.0 lb

## 2023-05-23 DIAGNOSIS — G620 Drug-induced polyneuropathy: Secondary | ICD-10-CM

## 2023-05-23 DIAGNOSIS — M5431 Sciatica, right side: Secondary | ICD-10-CM | POA: Diagnosis not present

## 2023-05-23 DIAGNOSIS — T451X5A Adverse effect of antineoplastic and immunosuppressive drugs, initial encounter: Secondary | ICD-10-CM

## 2023-05-23 MED ORDER — TIZANIDINE HCL 4 MG PO TABS
4.0000 mg | ORAL_TABLET | Freq: Every evening | ORAL | 0 refills | Status: DC | PRN
  Filled 2023-05-23: qty 30, 30d supply, fill #0

## 2023-05-23 MED ORDER — GABAPENTIN 600 MG PO TABS
900.0000 mg | ORAL_TABLET | Freq: Two times a day (BID) | ORAL | 3 refills | Status: AC
  Filled 2023-05-23 – 2023-07-31 (×2): qty 270, 90d supply, fill #0
  Filled 2023-10-25: qty 270, 90d supply, fill #1
  Filled 2024-05-06: qty 270, 90d supply, fill #2

## 2023-05-23 NOTE — Patient Instructions (Signed)
 We will refer you to out-patient PT  Start tizanidine 4mg  at bedtime.  Stop flexeril

## 2023-05-23 NOTE — Progress Notes (Signed)
 Follow-up Visit   Date: 05/23/23    [name] MRN: [id number] DOB: 1970/11/23   Interim History: [name] is a 53 y.o. right-handed Caucasian female with ovarian cancer s/p bilateral SPO and hysterectomy and chemotherapy (11/2014),  sarcoidosis, hypothyroidism, endometriosis, migraines, and anxiety returning to the clinic for follow-up of chemotherapy-induced neuropathy, lumbar strain, and carpal tunnel syndrome.   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 Jerri  Return to clinic in 6 months  ------------------------------------------------------------------ UPDATE 01/26/2022:  She is here for follow-up visit.  Neuropathy is well-controlled on gabapentin  600mg  TID.  She admits that occasionally skipping the mid-day dose and does not notice a marked change.  No falls. She has ongoing hand paresthesias from CTS and uses wrist braces.  Her low back continues to be achy and she takes flexeril  at bedtime.  She has noticed that occasionally, she has burning and tingling which radiates across the low back on the right side, it does not travel into the leg.   UPDATE 07/27/2022:  She is here for 6 month visit.  Today, she reports that her overall symptoms of hand tingling, neuropathy, and low back pain is worse.   She was unable to do PT for low back pain due to her work schedule, but is interested in doing this now.  She has noticed increased tingling in the hands, especially in the left hand, where it is worse in the 4th and 5th finger.  She does admit to resting her left arm on the desk and sleeps on her side with arms flexed.  She is having more tingling/sensation of feet asleep.  At her last visit, gabapentin  was reduced to 600mg  BID, however, she has become more symptomatic at this dose.   UPDATE 05/23/2023:  She is here for  follow-up visit. She underwent bilateral CTS release by Dr. Jerri and has improved numbness/tingling of the hands.  She has pain at the base of the thumbs and wrists when she applies pressure on the hands.  She continues to have pain involving the right buttocks which radiates down the right leg.  She also has low back pain.  She did not complete PT.  Flexeril  5mg  BID has minimal benefit, but it helps her sleep.  She also takes gabapentin  900mg  BID which helps with neuropathy.    Medications:  Current Outpatient Medications on File Prior to Visit  Medication Sig Dispense Refill   ALPRAZolam  (XANAX ) 1 MG tablet Take 1 tablet (1 mg total) by mouth 2 (two) times daily as needed for anxiety. 60 tablet 2   amphetamine -dextroamphetamine  (ADDERALL  XR) 20 MG 24 hr capsule Take 1 capsule (20 mg total) by mouth daily. 30 capsule 0   buPROPion  (WELLBUTRIN  XL) 300 MG 24 hr tablet Take 1 tablet (300 mg total) by mouth daily. 90 tablet 3   clobetasol  cream (TEMOVATE ) 0.05 % Apply 1 Application topically 2 (two) times daily as needed. 45 g 0   estradiol  (ESTRACE ) 1 MG tablet Take 1 tablet (1 mg total) by mouth daily. 90 tablet 3   fluticasone -salmeterol (ADVAIR  DISKUS) 250-50 MCG/ACT AEPB Inhale 1 puff into the lungs in the morning and at bedtime. 180 each 3   folic acid  (FOLVITE ) 1 MG tablet Take 2 tablets (2 mg total) by mouth daily. 180 tablet 3   levothyroxine  (SYNTHROID ) 100 MCG tablet Take 1 tablet (100 mcg total) by  mouth daily. 90 tablet 3   losartan  (COZAAR ) 25 MG tablet Take 1 tablet (25 mg total) by mouth daily. 90 tablet 3   methotrexate  50 MG/2ML injection Inject 0.8 mLs (20 mg total) into the skin once a week. 10 mL 0   omeprazole  (PRILOSEC) 40 MG capsule Take 1 capsule (40 mg total) by mouth daily. Please call [phone] to schedule an office visit for more refills 30 capsule 3   polyvinyl alcohol  (ARTIFICIAL TEARS) 1.4 % ophthalmic solution Place 1 drop into both eyes as needed for dry eyes. 15 mL 0    sertraline  (ZOLOFT ) 100 MG tablet Take 2 tablets (200 mg total) by mouth daily. 180 tablet 3   traZODone  (DESYREL ) 50 MG tablet Take 0.5-1 tablets (25-50 mg total) by mouth at bedtime as needed for sleep. 90 tablet 1   No current facility-administered medications on file prior to visit.    Allergies:  Allergies  Allergen Reactions   Moxifloxacin Anaphylaxis    needs epinephrine  shot   Quinolones Anaphylaxis, Shortness Of Breath and Swelling   Sulfonamide Derivatives Hives, Swelling and Rash    needs epinephrine  shot--rash and lip swelling   Doxycycline Nausea And Vomiting   Escitalopram  Oxalate Other (See Comments)     fatigue   Lisinopril  Cough    Vital Signs:  BP 121/68   Pulse 92   Ht 5' 7 (1.702 m)   Wt (!) 325 lb (147.4 kg)   LMP 11/02/2014   SpO2 94%   BMI 50.90 kg/m   Neurological Exam: MENTAL STATUS including orientation to time, place, person, recent and remote memory, attention span and concentration, language, and fund of knowledge is normal.  Speech is not dysarthric.  CRANIAL NERVES:   Extraocular muscles intact.  No ptosis.  Face is symmetric.    MOTOR:  Motor strength shows 5/5 motor strength throughout.   SENSATION:  Vibration intact at the hands and knees, absent below the ankles.    MSRs:  Right                                                                 Left brachioradialis 2+  brachioradialis 2+  biceps 2+  biceps 2+  triceps 1+  triceps 1+  Patellar 1+  Patellar 1+  ankle jerk 0  ankle jerk 0  plantar response down  plantar response down   COORDINATION/GAIT:   Gait wide-based, stable. Unassited  Data: Lab Results  Component Value Date   TSH 3.36 06/17/2022   NCS/EMG of the upper extremities 11/24/2015:  Bilateral median neuropathy at or distal to the wrist, consistent with clinical diagnosis of carpal tunnel syndrome. Overall, these findings are moderate in degree electrically    Thank you for allowing me to participate in  patient's care.  If I can answer any additional questions, I would be pleased to do so.    Sincerely,    Kathi Dohn K. Tobie, DO

## 2023-05-29 ENCOUNTER — Other Ambulatory Visit (HOSPITAL_BASED_OUTPATIENT_CLINIC_OR_DEPARTMENT_OTHER): Payer: Self-pay

## 2023-06-04 NOTE — Progress Notes (Unsigned)
Office Visit Note   Patient: [name]           Date of Birth: 1971-01-17           MRN: [id number] Visit Date: 06/06/2023              Requested by: Corwin Levins, MD [address] [place],  [place] [address] PCP: Corwin Levins, MD   Assessment & Plan: Visit Diagnoses:  1. Chronic pain of right knee   2. Primary osteoarthritis of first carpometacarpal joint of right hand   3. Primary osteoarthritis of first carpometacarpal joint of left hand   4. Body mass index 50.0-59.9, adult (HCC)     Plan: [name] is a 53 year old female with bone-on-bone right knee DJD and early bilateral thumb CMC OA.  Recommend over-the-counter CMC braces from [employer].  For the knee she does have a small effusion and I offered an aspiration cortisone injection but she had a vasovagal reaction to a prior knee injection years ago so she is hesitant.  She would like to try meloxicam for now.  Questions encouraged and answered.  Follow-up as needed.  The patient meets the AMA guidelines for Morbid (severe) obesity with a BMI > 40.0 and I have recommended weight loss.  Follow-Up Instructions: No follow-ups on file.   Orders:  Orders Placed This Encounter  Procedures   XR Finger Thumb Left   XR Finger Thumb Right   XR KNEE 3 VIEW RIGHT   Meds ordered this encounter  Medications   meloxicam (MOBIC) 7.5 MG tablet    Sig: Take 1 tablet (7.5 mg total) by mouth 2 (two) times daily as needed for pain.    Dispense:  30 tablet    Refill:  2      Procedures: No procedures performed   Clinical Data: No additional findings.   Subjective: Chief Complaint  Patient presents with   Right Hand - Pain   Left Hand - Pain   Right Knee - Pain    HPI [name] comes in today for evaluation of bilateral thumb pain and right knee pain.  She uses her hands quite a bit.  She feels a soreness in the thenar compartment and the base of the thumb.  Denies any locking.  She feels pain in her knees with start up  stiffness and pain that makes it difficult with using stairs.  Denies any injuries. Review of Systems  Constitutional: Negative.   HENT: Negative.    Eyes: Negative.   Respiratory: Negative.    Cardiovascular: Negative.   Endocrine: Negative.   Musculoskeletal: Negative.   Neurological: Negative.   Hematological: Negative.   Psychiatric/Behavioral: Negative.    All other systems reviewed and are negative.    Objective: Vital Signs: LMP 11/02/2014   Physical Exam Vitals and nursing note reviewed.  Constitutional:      Appearance: She is well-developed.  HENT:     Head: Atraumatic.     Nose: Nose normal.  Eyes:     Extraocular Movements: Extraocular movements intact.  Cardiovascular:     Pulses: Normal pulses.  Pulmonary:     Effort: Pulmonary effort is normal.  Abdominal:     Palpations: Abdomen is soft.  Musculoskeletal:     Cervical back: Neck supple.  Skin:    General: Skin is warm.     Capillary Refill: Capillary refill takes less than 2 seconds.  Neurological:     Mental Status: She is alert. Mental status is  at baseline.  Psychiatric:        Behavior: Behavior normal.        Thought Content: Thought content normal.        Judgment: Judgment normal.     Ortho Exam Examinations of bilateral thumbs show minimal discomfort with CMC grind test.  No crepitus.  No triggering.  Examination of right knee shows a small joint effusion.  Limited flexion due to the effusion. Specialty Comments:  No specialty comments available.  Imaging: XR Finger Thumb Left Result Date: 06/06/2023 X-rays of the left thumb shows mild arthritic changes at the [facility] joint.  XR Finger Thumb Right Result Date: 06/06/2023 X-rays of the right thumb show mild arthritic changes of the [facility] joint.  XR KNEE 3 VIEW RIGHT Result Date: 06/06/2023 X-rays demonstrate severe osteoarthritis.  Symmetric bone-on-bone joint space narrowing.    PMFS History: Patient Active Problem List    Diagnosis Date Noted   Carpal tunnel syndrome on right 10/12/2022   Ganglion cyst of flexor tendon sheath of finger of right hand 10/12/2022   Primary osteoarthritis of first carpometacarpal joint of right hand 10/12/2022   Adjustment disorder with mixed anxiety and depressed mood 09/12/2022   Carpal tunnel syndrome on left 09/07/2022   Eating disorder 08/11/2022   Blurry vision, bilateral 06/28/2022   Primary osteoarthritis of first carpometacarpal joint of left hand 06/28/2022   Hoarseness 06/18/2022   Insulin resistance 05/18/2022   Major depressive disorder 03/09/2022   Polyphagia 03/09/2022   Left foot pain 02/16/2022   Osteoarthritis of both knees 01/13/2022   Hot flashes due to menopause 12/19/2021   ADD (attention deficit disorder) 12/19/2021   SOBOE (shortness of breath on exertion) 11/16/2021   Elevated glucose 11/16/2021   Vitamin D deficiency 11/16/2021   Prediabetes 11/16/2021   Bunion, left 08/06/2021   Morbid obesity (HCC) with initial BMI 52 06/26/2021   Venous insufficiency 06/25/2021   Incisional hernia 12/25/2020   Mediastinal lymphadenopathy    Pulmonary nodules 10/20/2020   Multiple skin nodules 07/24/2020   Right lumbar radiculopathy 05/18/2020   Periumbilical hernia 05/18/2020   TB lung, latent 05/18/2020   Acute non-recurrent maxillary sinusitis 11/09/2019   HTN (hypertension) 04/14/2019   Mass of left wrist 04/14/2019   External otitis of right ear 04/14/2019   Choledocholithiasis    Abnormal cholangiogram    Cholecystitis with cholelithiasis 07/24/2018   Hot flash not due to menopause 10/16/2017   Eczema 10/16/2017   Flu-like symptoms 06/20/2017   Cognitive changes 01/30/2017   Disturbed concentration 01/26/2017   Left shoulder pain 11/01/2016   Right low back pain 11/01/2016   Elevated blood pressure, situational 05/05/2016   Surgical menopause 01/29/2016   OSA on CPAP 12/22/2015   Cough 11/26/2015   Capsulitis 10/06/2015   Ganglion cyst of  left foot 10/06/2015   Fatigue 09/13/2015   Asthma 09/08/2015   Exertional dyspnea 09/08/2015   Peroneal ganglion cyst 07/20/2015   Low back pain 07/20/2015   Peroneal tendinitis of left lower leg 06/25/2015   Nonallopathic lesion of cervical region 06/25/2015   Wheezing 05/08/2015   Polyarthralgia 05/08/2015   Peripheral edema 03/16/2015   Central line complication 03/16/2015   Chemotherapy induced neutropenia (HCC) 02/28/2015   Screening for colorectal cancer 01/05/2015   Chemotherapy induced nausea and vomiting 01/02/2015   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   Premature surgical menopause 12/17/2014   Leukopenia due to antineoplastic chemotherapy (HCC) 12/17/2014   Port-A-Cath in place 12/17/2014   Encounter for antineoplastic chemotherapy  12/17/2014   Myalgia 12/09/2014   Hypersensitivity reaction 12/05/2014   Poor venous access 11/28/2014   Postoperative cellulitis of surgical wound 11/24/2014   Malignant neoplasm of both ovaries 11/10/2014   Anxiety 11/10/2014   Adnexal mass 11/10/2014   Encounter for well adult exam with abnormal findings 09/10/2014   Hypothyroidism 09/10/2014   Hypersomnolence 09/10/2014   Acute non-recurrent frontal sinusitis 06/24/2014   Right knee pain 06/24/2014   Lower back pain 01/08/2014   Otalgia 12/18/2009   KNEE PAIN, RIGHT 12/18/2009   INGROWN TOENAIL 12/17/2008   Backache 12/17/2008   Acute bronchospasm 11/11/2008   Depression 11/29/2007   Insomnia 11/29/2007   Generalized anxiety disorder 04/13/2007   Allergic rhinitis 04/13/2007   GERD 04/13/2007   Endometriosis determined by laparoscopy 04/13/2007   Migraine headache 01/29/2007   Past Medical History:  Diagnosis Date   Anxiety    Arthritis    Asthma    Back pain    Barrett's esophagus    Chemotherapy-induced neuropathy (HCC)    Chronic lower back pain    Depression    Dyspnea    Elevated blood pressure, situational 05/05/2016   Endometriosis     Exercise-induced asthma 09/08/2015   Fatigue    Gallbladder problem    GERD (gastroesophageal reflux disease)    Heartburn    History of kidney stones    Hypertension    Hypothyroidism    Infertility, female    Joint pain    Joint stiffness    Lower extremity edema    Migraine    ovarian cancer    Ovarian cancer, bilateral (HCC) 11/28/2014   PONV (postoperative nausea and vomiting)    Sarcoidosis    Sleep apnea    SOBOE (shortness of breath on exertion)    Thyroid disease    Umbilical hernia    Vitamin D deficiency    woke up while porta cath removed     Family History  Problem Relation Age of Onset   Hyperlipidemia Mother    Hypertension Mother    Diabetes Mother    Stroke Mother        Deceased   Heart disease Mother    Osteoarthritis Mother    Atrial fibrillation Father        Living   Stroke Father        optic nerve left eye   Healthy Brother    Diabetes Maternal Aunt    Heart failure Maternal Grandmother    Heart attack Maternal Grandfather    Dementia Paternal Grandmother    Dementia Paternal Grandfather     Past Surgical History:  Procedure Laterality Date   ABDOMINAL HYSTERECTOMY  11/10/2014   at [facility], Exp lap, supracervical hyst, BSO, infracolic omentectomy, lymphadenectomy, aortic lymph node sampling   BIOPSY  11/04/2021   Procedure: BIOPSY;  Surgeon: Lynann Bologna, MD;  Location: [facility];  Service: Gastroenterology;;   BRONCHIAL NEEDLE ASPIRATION BIOPSY  12/16/2020   Procedure: BRONCHIAL NEEDLE ASPIRATION BIOPSIES;  Surgeon: Oretha Milch, MD;  Location: [facility] ENDOSCOPY;  Service: Cardiopulmonary;;   BRONCHIAL WASHINGS  12/16/2020   Procedure: BRONCHIAL WASHINGS;  Surgeon: Oretha Milch, MD;  Location: [facility] END[facility]COPY;  Service: Cardiopulmonary;;   BUNIONECTOMY Left 09/14/2021   CARPAL TUNNEL RELEASE Left 09/07/2022   Procedure: LEFT CARPAL TUNNEL RELEASE, LEFT THUMB CARPOMETACARPAL INJECTION;  Surgeon: Tarry Kos, MD;  Location: [facility];  Service: Orthopedics;  Laterality: Left;   CARPAL TUNNEL RELEASE Right 10/12/2022   Procedure:  RIGHT CARPAL TUNNEL RELEASE, RIGHT THUMB CARPOMETACARPAL INJECTION;  Surgeon: Tarry Kos, MD;  Location: [facility];  Service: Orthopedics;  Laterality: Right;   CHOLECYSTECTOMY N/A 07/24/2018   Procedure: LAPAROSCOPIC CHOLECYSTECTOMY WITH INTRAOPERATIVE CHOLANGIOGRAM;  Surgeon: Darnell Level, MD;  Location: [facility];  Service: General;  Laterality: N/A;   COLONOSCOPY WITH PROPOFOL N/A 11/04/2021   Procedure: COLONOSCOPY WITH PROPOFOL;  Surgeon: Lynann Bologna, MD;  Location: [facility];  Service: Gastroenterology;  Laterality: N/A;   ERCP N/A 07/26/2018   Procedure: ENDOSCOPIC RETROGRADE CHOLANGIOPANCREATOGRAPHY (ERCP);  Surgeon: Meryl Dare, MD;  Location: [facility];  Service: Endoscopy;  Laterality: N/A;   ESOPHAGOGASTRODUODENOSCOPY (EGD) WITH PROPOFOL N/A 11/04/2021   Procedure: ESOPHAGOGASTRODUODENOSCOPY (EGD) WITH PROPOFOL;  Surgeon: Lynann Bologna, MD;  Location: [facility];  Service: Gastroenterology;  Laterality: N/A;   GANGLION CYST EXCISION Right    hand   HYSTERECTOMY ABDOMINAL WITH SALPINGO-OOPHORECTOMY  11/10/2014   ovarian cancer, tumor removal   INCISIONAL HERNIA REPAIR N/A 12/25/2020   Procedure: LAPAROSCOPIC INCISIONAL HERNIA REPAIR WITH MESH;  Surgeon: Kinsinger, De Blanch, MD;  Location: [facility];  Service: General;  Laterality: N/A;   ingrown nail removal     IR GENERIC HISTORICAL  05/12/2016   IR REMOVAL TUN ACCESS W/ PORT W/O FL MOD SED 05/12/2016 [facility]   LAPAROSCOPIC ENDOMETRIOSIS FULGURATION     LIPOMA EXCISION Left    ankle   meniscal tear Right    MENISCUS REPAIR Right    oral sugery     POLYPECTOMY  11/04/2021   Procedure: POLYPECTOMY;  Surgeon: Lynann Bologna, MD;  Location: [facility];  Service: Gastroenterology;;   Shelda Pal a cath removal     PORTA CATH INSERTION     REMOVAL OF STONES  07/26/2018   Procedure: REMOVAL OF STONES;   Surgeon: Meryl Dare, MD;  Location: [facility];  Service: Endoscopy;;   SPHINCTEROTOMY  07/26/2018   Procedure: Dennison Mascot;  Surgeon: Meryl Dare, MD;  Location: [facility];  Service: Endoscopy;;   UMBILICAL HERNIA REPAIR  12/29/2020   VIDEO BRONCHOSCOPY WITH ENDOBRONCHIAL ULTRASOUND N/A 12/16/2020   Procedure: VIDEO BRONCHOSCOPY WITH ENDOBRONCHIAL ULTRASOUND;  Surgeon: Oretha Milch, MD;  Location: [facility] ENDOSCOPY;  Service: Cardiopulmonary;  Laterality: N/A;   Social History   Occupational History   Occupation: Educational psychologist: [employer]  Tobacco Use   Smoking status: Never    Passive exposure: Past   Smokeless tobacco: Never  Vaping Use   Vaping status: Never Used  Substance and Sexual Activity   Alcohol use: Not Currently   Drug use: No   Sexual activity: Not on file

## 2023-06-06 ENCOUNTER — Other Ambulatory Visit (HOSPITAL_BASED_OUTPATIENT_CLINIC_OR_DEPARTMENT_OTHER): Payer: Self-pay

## 2023-06-06 ENCOUNTER — Ambulatory Visit: Payer: Commercial Managed Care - PPO | Admitting: Orthopaedic Surgery

## 2023-06-06 ENCOUNTER — Other Ambulatory Visit (INDEPENDENT_AMBULATORY_CARE_PROVIDER_SITE_OTHER): Payer: Self-pay

## 2023-06-06 DIAGNOSIS — G8929 Other chronic pain: Secondary | ICD-10-CM

## 2023-06-06 DIAGNOSIS — M1812 Unilateral primary osteoarthritis of first carpometacarpal joint, left hand: Secondary | ICD-10-CM | POA: Diagnosis not present

## 2023-06-06 DIAGNOSIS — M1811 Unilateral primary osteoarthritis of first carpometacarpal joint, right hand: Secondary | ICD-10-CM

## 2023-06-06 DIAGNOSIS — Z6841 Body Mass Index (BMI) 40.0 and over, adult: Secondary | ICD-10-CM | POA: Diagnosis not present

## 2023-06-06 DIAGNOSIS — M25561 Pain in right knee: Secondary | ICD-10-CM

## 2023-06-06 MED ORDER — MELOXICAM 7.5 MG PO TABS
7.5000 mg | ORAL_TABLET | Freq: Two times a day (BID) | ORAL | 2 refills | Status: DC | PRN
  Filled 2023-06-06: qty 30, 15d supply, fill #0
  Filled 2023-06-23: qty 30, 15d supply, fill #1
  Filled 2023-07-15: qty 30, 15d supply, fill #2

## 2023-06-10 ENCOUNTER — Other Ambulatory Visit (HOSPITAL_BASED_OUTPATIENT_CLINIC_OR_DEPARTMENT_OTHER): Payer: Self-pay

## 2023-06-10 ENCOUNTER — Other Ambulatory Visit: Payer: Self-pay | Admitting: Internal Medicine

## 2023-06-12 ENCOUNTER — Other Ambulatory Visit (HOSPITAL_BASED_OUTPATIENT_CLINIC_OR_DEPARTMENT_OTHER): Payer: Self-pay

## 2023-06-12 MED ORDER — AMPHETAMINE-DEXTROAMPHET ER 20 MG PO CP24
20.0000 mg | ORAL_CAPSULE | Freq: Every day | ORAL | 0 refills | Status: DC
  Filled 2023-06-12: qty 30, 30d supply, fill #0

## 2023-06-13 ENCOUNTER — Encounter (HOSPITAL_BASED_OUTPATIENT_CLINIC_OR_DEPARTMENT_OTHER): Payer: Self-pay | Admitting: Emergency Medicine

## 2023-06-13 ENCOUNTER — Other Ambulatory Visit (HOSPITAL_BASED_OUTPATIENT_CLINIC_OR_DEPARTMENT_OTHER): Payer: Self-pay

## 2023-06-13 ENCOUNTER — Ambulatory Visit: Payer: Self-pay | Admitting: Internal Medicine

## 2023-06-13 ENCOUNTER — Other Ambulatory Visit: Payer: Self-pay

## 2023-06-13 ENCOUNTER — Ambulatory Visit (HOSPITAL_COMMUNITY)
Admission: EM | Admit: 2023-06-13 | Discharge: 2023-06-13 | Discharge status: Against Medical Advice | Payer: Commercial Managed Care - PPO

## 2023-06-13 ENCOUNTER — Other Ambulatory Visit: Payer: Self-pay | Admitting: Physician Assistant

## 2023-06-13 ENCOUNTER — Emergency Department (HOSPITAL_BASED_OUTPATIENT_CLINIC_OR_DEPARTMENT_OTHER)
Admission: EM | Admit: 2023-06-13 | Discharge: 2023-06-13 | Disposition: A | Discharge status: Home / Self Care | Payer: Commercial Managed Care - PPO | Attending: Emergency Medicine | Admitting: Emergency Medicine

## 2023-06-13 ENCOUNTER — Emergency Department (HOSPITAL_BASED_OUTPATIENT_CLINIC_OR_DEPARTMENT_OTHER): Payer: Commercial Managed Care - PPO | Admitting: Radiology

## 2023-06-13 DIAGNOSIS — F419 Anxiety disorder, unspecified: Secondary | ICD-10-CM | POA: Insufficient documentation

## 2023-06-13 DIAGNOSIS — D72829 Elevated white blood cell count, unspecified: Secondary | ICD-10-CM | POA: Insufficient documentation

## 2023-06-13 DIAGNOSIS — H73893 Other specified disorders of tympanic membrane, bilateral: Secondary | ICD-10-CM | POA: Insufficient documentation

## 2023-06-13 DIAGNOSIS — R0789 Other chest pain: Secondary | ICD-10-CM | POA: Insufficient documentation

## 2023-06-13 LAB — CBC
HCT: 42.2 % (ref 36.0–46.0)
Hemoglobin: 14.2 g/dL (ref 12.0–15.0)
MCH: 29.8 pg (ref 26.0–34.0)
MCHC: 33.6 g/dL (ref 30.0–36.0)
MCV: 88.7 fL (ref 80.0–100.0)
Platelets: 305 10*3/uL (ref 150–400)
RBC: 4.76 MIL/uL (ref 3.87–5.11)
RDW: 12.8 % (ref 11.5–15.5)
WBC: 11 10*3/uL — ABNORMAL HIGH (ref 4.0–10.5)
nRBC: 0 % (ref 0.0–0.2)

## 2023-06-13 LAB — BASIC METABOLIC PANEL
Anion gap: 8 (ref 5–15)
BUN: 13 mg/dL (ref 6–20)
CO2: 26 mmol/L (ref 22–32)
Calcium: 9.1 mg/dL (ref 8.9–10.3)
Chloride: 103 mmol/L (ref 98–111)
Creatinine, Ser: 0.75 mg/dL (ref 0.44–1.00)
GFR, Estimated: >60 mL/min (ref >60)
Glucose, Bld: 88 mg/dL (ref 70–99)
Potassium: 3.8 mmol/L (ref 3.5–5.1)
Sodium: 137 mmol/L (ref 135–145)

## 2023-06-13 LAB — TROPONIN I (HIGH SENSITIVITY): Troponin I (High Sensitivity): <2 ng/L (ref <18)

## 2023-06-13 MED ORDER — AMOXICILLIN-POT CLAVULANATE 875-125 MG PO TABS
1.0000 | ORAL_TABLET | Freq: Two times a day (BID) | ORAL | 0 refills | Status: DC
  Filled 2023-06-13: qty 10, 5d supply, fill #0

## 2023-06-13 MED ORDER — METHOTREXATE SODIUM CHEMO INJECTION 50 MG/2ML
20.0000 mg | INTRAMUSCULAR | 0 refills | Status: AC
  Filled 2023-06-13: qty 10, 84d supply, fill #0

## 2023-06-13 MED ORDER — DROPERIDOL 2.5 MG/ML IJ SOLN
2.5000 mg | Freq: Once | INTRAMUSCULAR | Status: AC
  Administered 2023-06-13: 2.5 mg via INTRAMUSCULAR
  Filled 2023-06-13: qty 2

## 2023-06-13 MED ORDER — ALPRAZOLAM 0.5 MG PO TABS
0.5000 mg | ORAL_TABLET | Freq: Once | ORAL | Status: AC
  Administered 2023-06-13: 0.5 mg via ORAL
  Filled 2023-06-13: qty 1

## 2023-06-13 MED ORDER — AMOXICILLIN-POT CLAVULANATE 875-125 MG PO TABS
1.0000 | ORAL_TABLET | Freq: Once | ORAL | Status: AC
  Administered 2023-06-13: 1 via ORAL
  Filled 2023-06-13: qty 1

## 2023-06-13 MED ORDER — FOLIC ACID 1 MG PO TABS
2.0000 mg | ORAL_TABLET | Freq: Every day | ORAL | 3 refills | Status: AC
  Filled 2023-06-13: qty 180, 90d supply, fill #0
  Filled 2023-10-25: qty 180, 90d supply, fill #1

## 2023-06-13 NOTE — ED Notes (Signed)
Reported by registration that patient left AMA.

## 2023-06-13 NOTE — ED Notes (Signed)
Pt left without being seen. Triage and vitals were not completed.

## 2023-06-13 NOTE — BH Assessment (Addendum)
Referred pt to IRIS Coordinator for TTS assessment. Spoke with charge nurse Conner to advise. Started EPIC chat for the group.   [name] T. Jimmye Norman, MS, [facility], [facility] Triage Specialist [facility]

## 2023-06-13 NOTE — ED Triage Notes (Signed)
Anxiety, tight chest. Recent stressor last week. Denies N/V/D, SOB.

## 2023-06-13 NOTE — ED Provider Notes (Signed)
 [place] EMERGENCY DEPARTMENT AT [facility] Provider Note   CSN: [id number] Arrival date & time: 06/13/23  1300     History  No chief complaint on file.   Jane Birkel is a 53 y.o. female history significant for GAD presents today for anxiety and chest tightness.  She patient endorses recent stressors.  Patient denies shortness of breath, nausea, vomiting, or fever.  Patient denies SI/HI/AVH.  Patient also endorses bilateral ear pain over the past few days.  Patient states that she would like to speak to TTS as she feels her anxiety medication is not working and she is having increased situational anxiety.  Patient denies alcohol , tobacco, or illicit drug use.  HPI     Home Medications Prior to Admission medications   Medication Sig Start Date End Date Taking? Authorizing Provider  ALPRAZolam  (XANAX ) 1 MG tablet Take 1 tablet (1 mg total) by mouth 2 (two) times daily as needed for anxiety. 03/20/23  Yes Norleen Lynwood ORN, MD  amoxicillin -clavulanate (AUGMENTIN ) 875-125 MG tablet Take 1 tablet by mouth every 12 (twelve) hours. 06/13/23  Yes Tarrence Enck N, PA-C  amphetamine -dextroamphetamine  (ADDERALL  XR) 20 MG 24 hr capsule Take 1 capsule (20 mg total) by mouth daily. 06/12/23  Yes Norleen Lynwood ORN, MD  buPROPion  (WELLBUTRIN  XL) 300 MG 24 hr tablet Take 1 tablet (300 mg total) by mouth daily. 11/15/22  Yes Norleen Lynwood ORN, MD  clobetasol  cream (TEMOVATE ) 0.05 % Apply 1 Application topically 2 (two) times daily as needed. 11/29/22  Yes Cheryl Waddell HERO, PA-C  estradiol  (ESTRACE ) 1 MG tablet Take 1 tablet (1 mg total) by mouth daily. 01/13/23  Yes Norleen Lynwood ORN, MD  fluticasone -salmeterol (ADVAIR  DISKUS) 250-50 MCG/ACT AEPB Inhale 1 puff into the lungs in the morning and at bedtime. 07/21/22  Yes Norleen Lynwood ORN, MD  folic acid  (FOLVITE ) 1 MG tablet Take 2 tablets (2 mg total) by mouth daily. 06/13/23  Yes Cheryl Waddell HERO, PA-C  gabapentin  (NEURONTIN ) 600 MG tablet Take 1.5 tablets (900 mg total) by  mouth 2 (two) times daily. OK to take extra dose as needed 05/23/23 05/22/24 Yes Patel, Donika K, DO  levothyroxine  (SYNTHROID ) 100 MCG tablet Take 1 tablet (100 mcg total) by mouth daily. 01/13/23  Yes Norleen Lynwood ORN, MD  losartan  (COZAAR ) 25 MG tablet Take 1 tablet (25 mg total) by mouth daily. 01/20/23  Yes Norleen Lynwood ORN, MD  meloxicam  (MOBIC ) 7.5 MG tablet Take 1 tablet (7.5 mg total) by mouth 2 (two) times daily as needed for pain. 06/06/23  Yes Jerri Kay HERO, MD  methotrexate  50 MG/2ML injection Inject 0.8 mLs (20 mg total) into the skin once a week. 06/13/23  Yes Cheryl Waddell HERO, PA-C  omeprazole  (PRILOSEC) 40 MG capsule Take 1 capsule (40 mg total) by mouth daily. Please call [phone] to schedule an office visit for more refills 02/16/23  Yes Charlanne Groom, MD  polyvinyl alcohol  (ARTIFICIAL TEARS) 1.4 % ophthalmic solution Place 1 drop into both eyes as needed for dry eyes. 10/29/21  Yes Norleen Lynwood ORN, MD  sertraline  (ZOLOFT ) 100 MG tablet Take 2 tablets (200 mg total) by mouth daily. 08/12/22  Yes Norleen Lynwood ORN, MD  tiZANidine  (ZANAFLEX ) 4 MG tablet Take 1 tablet (4 mg total) by mouth at bedtime as needed for muscle spasms. 05/23/23  Yes Patel, Donika K, DO  traZODone  (DESYREL ) 50 MG tablet Take 0.5-1 tablets (25-50 mg total) by mouth at bedtime as needed for sleep. 02/16/23  Yes John,  Lynwood ORN, MD      Allergies    Moxifloxacin, Quinolones, Sulfonamide derivatives, Doxycycline, Escitalopram  oxalate, and Lisinopril     Review of Systems   Review of Systems  HENT:  Positive for ear pain.   Cardiovascular:  Positive for chest pain.  Psychiatric/Behavioral:  The patient is nervous/anxious.     Physical Exam Updated Vital Signs BP 131/74   Pulse 74   Temp 98 F (36.7 C) (Oral)   Resp 12   LMP 11/02/2014   SpO2 95%  Physical Exam Vitals and nursing note reviewed.  Constitutional:      General: She is not in acute distress.    Appearance: Normal appearance. She is well-developed.  HENT:      Head: Normocephalic and atraumatic.     Right Ear: External ear normal. Tympanic membrane is scarred and erythematous.     Left Ear: External ear normal. A middle ear effusion is present. Tympanic membrane is scarred and erythematous.     Nose: Congestion and rhinorrhea present.     Mouth/Throat:     Mouth: Mucous membranes are moist.     Pharynx: Oropharynx is clear.  Eyes:     Extraocular Movements: Extraocular movements intact.     Conjunctiva/sclera: Conjunctivae normal.  Cardiovascular:     Rate and Rhythm: Normal rate and regular rhythm.     Pulses: Normal pulses.     Heart sounds: Normal heart sounds. No murmur heard. Pulmonary:     Effort: Pulmonary effort is normal. No respiratory distress.     Breath sounds: Normal breath sounds.  Abdominal:     Palpations: Abdomen is soft.     Tenderness: There is no abdominal tenderness.  Musculoskeletal:        General: No swelling.     Cervical back: Neck supple.  Skin:    General: Skin is warm and dry.     Capillary Refill: Capillary refill takes less than 2 seconds.  Neurological:     General: No focal deficit present.     Mental Status: She is alert.     Motor: No weakness.  Psychiatric:        Attention and Perception: Attention and perception normal.        Mood and Affect: Mood is anxious. Affect is tearful.        Behavior: Behavior normal. Behavior is cooperative.        Thought Content: Thought content normal.     ED Results / Procedures / Treatments   Labs (all labs ordered are listed, but only abnormal results are displayed) Labs Reviewed  CBC - Abnormal; Notable for the following components:      Result Value   WBC 11.0 (*)    All other components within normal limits  BASIC METABOLIC PANEL  TROPONIN I (HIGH SENSITIVITY)    EKG EKG Interpretation Date/Time:  Tuesday June 13 2023 13:24:14 EST Ventricular Rate:  78 PR Interval:  138 QRS Duration:  74 QT Interval:  368 QTC Calculation: 419 R  Axis:   39  Text Interpretation: Normal sinus rhythm Possible Anterolateral infarct , age undetermined Abnormal ECG Poor baseline Confirmed by Jerrol Lynwood ([id number]) on 06/13/2023 7:10:56 PM  Radiology DG Chest 2 View Result Date: 06/13/2023 CLINICAL DATA:  Chest tightness. EXAM: CHEST - 2 VIEW COMPARISON:  Chest x-ray dated November 15, 2022. FINDINGS: The heart size and mediastinal contours are within normal limits. Both lungs are clear. The visualized skeletal structures are unremarkable. IMPRESSION: No active cardiopulmonary  disease. Electronically Signed   By: Elsie ONEIDA Shoulder M.D.   On: 06/13/2023 14:17    Procedures Procedures    Medications Ordered in ED Medications  amoxicillin -clavulanate (AUGMENTIN ) 875-125 MG per tablet 1 tablet (1 tablet Oral Given 06/13/23 1544)  ALPRAZolam  (XANAX ) tablet 0.5 mg (0.5 mg Oral Given 06/13/23 1544)  droperidol  (INAPSINE ) 2.5 MG/ML injection 2.5 mg (2.5 mg Intramuscular Given 06/13/23 1859)    ED Course/ Medical Decision Making/ A&P                                 Medical Decision Making Amount and/or Complexity of Data Reviewed Labs: ordered. Radiology: ordered.  Risk Prescription drug management.   This patient presents to the ED with chief complaint(s) of anxiety and chest pain with pertinent past medical history of anxiety which further complicates the presenting complaint. The complaint involves an extensive differential diagnosis and also carries with it a high risk of complications and morbidity.    The differential diagnosis includes anxiety, ACS, electrolyte abnormality, anemia  Additional history obtained: Records reviewed Primary Care Documents  ED Course and Reassessment: Patient given first dose of Augmentin  for what is likely sinusitis Patient given xanax  for anxiety  Independent labs interpretation:  The following labs were independently interpreted:  CBC: Mild leukocytosis at 11 BMP: No notable findings Troponin:<2 EKG:  Normal sinus rhythm  Independent visualization of imaging: - I independently visualized the following imaging with scope of interpretation limited to determining acute life threatening conditions related to emergency care: Chest x-ray, which revealed no active cardiopulmonary disease  Consultation: - Consulted or discussed management/test interpretation w/ external professional: None  Consideration for admission or further workup: Considered for mission further workup however patient's vital signs, physical exam, labs, and imaging were reassuring.  Patient is still anxious but declines SI/HI/AVH.  At this time I believe patient has decision-making capacity and is not a threat to herself or others.  Discussed with patient and her husband going to [facility] for further evaluation and treatment for anxiety.  Patient's husband states that he will have her directly there upon discharge.        Final Clinical Impression(s) / ED Diagnoses Final diagnoses:  Anxiety  Atypical chest pain    Rx / DC Orders ED Discharge Orders          Ordered    amoxicillin -clavulanate (AUGMENTIN ) 875-125 MG tablet  Every 12 hours        06/13/23 1515              Francis Ileana SAILOR, PA-C 06/13/23 1921    Pamella Ozell LABOR, DO 06/17/23 0725

## 2023-06-13 NOTE — Telephone Encounter (Signed)
Connected briefly with pt, requested to speak directly with RN SM as they were resolving the triage and "coming to a resolution" per pt. Writer ensured pt was connected with RN SM via telephone.

## 2023-06-13 NOTE — Telephone Encounter (Addendum)
 Chief Complaint: anxiety Symptoms: acute anxiety and depression, chest pain, heaviness/tightness, intrusive thoughts Frequency: exacerbation of chronic anxiety began 4 days after an incident at work Pertinent Negatives: Patient denies SOB, tremors, SI/HI Disposition: [x] ED /[] Urgent Care (no appt availability in office) / [] Appointment(In office/virtual)/ []  [place] [facility]/ [] Home Care/ [] Refused Recommended Disposition /[]  Mobile Bus/ []  Follow-up with PCP Additional Notes: Pt reports anxiety for 4 days following an incident at work. Pt reports she has anxiety and depression at baseline, but that this anxiety is the worst it has ever been. Pt reports feeling safe at home, but not safe at work. Pt denies SI or HI. Pt reports she is eating and drinking normally, but has not showered in 3 days (pt attributes that to not having anywhere to be). Pt has not returned to work in 4 days since the incident and is anxious about the incident and losing her job. Pt very tearful. Pt also feels as though she is getting sick, reports fullness in her ears. Pt is taking her prescribed medications for anxiety and taking her PRN medication as needed but states it is not effective. Pt has taken short walks and hot baths but states it is only helpful for a short time. This RN advised pt go to the ED. At that point, phone call dropped while speaking with the pt. This RN called the pt back 4 times and was unable to leave a voicemail. Shortly after, pt called back and spoke to a different nurse. Pt requested to speak to this nurse only. This RN called the pt back again and the pt picked up the phone. RN again advised that pt go to the ED for her symptoms. Pt agreeable. RN offered to call 911 for the pt, but the pt asked the RN not to do that. Pt states she will go to the ED at [facility] as it only minutes from her house. Pt states she is safe to drive (husband cannot drive her as he is also at [facility] for his  own health appt). RN advised pt to call 911 if her symptoms worsen between now and the time she leaves the house - pt verbalized understanding.    Copied from CRM [id number]. Topic: Clinical - Red Word Triage >> Jun 13, 2023 11:24 AM Viola FALCON wrote: Red Word that prompted transfer to Nurse Triage: Patient calling regarding her anxiety and depression - had an incident last week and behavorial health is booking months out. No available appointments today [facility] [employer] employee) Reason for Disposition  [1] Panic attack symptoms (diagnosed in the past) AND [2] not better with usual treatment, reassurance, or Care Advice  Answer Assessment - Initial Assessment Questions 1. CONCERN: Did anything happen that prompted you to call today?      Incident last week involving HR. Not feeling comfortable to return to work - very anxious.  2. ANXIETY SYMPTOMS: Can you describe how you (your loved one; patient) have been feeling? (e.g., tense, restless, panicky, anxious, keyed up, overwhelmed, sense of impending doom).      Heavy feeling in my chest, no SOB, tight feeling in the chest. Trouble sleeping. Intrusive thoughts. I can't control anything in this moment. 3. ONSET: How long have you been feeling this way? (e.g., hours, days, weeks)     Anxiety started 4 days ago after an incident at work. Pt states she is safe at home, but not at work. 4. SEVERITY: How would you rate the level of anxiety? (e.g.,  0 - 10; or mild, moderate, severe).     10 or more 5. FUNCTIONAL IMPAIRMENT: How have these feelings affected your ability to do daily activities? Have you had more difficulty than usual doing your normal daily activities? (e.g., getting better, same, worse; self-care, school, work, interactions)     Has not been to work in 4 days. Pt still eating and drinking, but has not showered in 3 days. 6. HISTORY: Have you felt this way before? Have you ever been diagnosed with an anxiety problem in the  past? (e.g., generalized anxiety disorder, panic attacks, PTSD). If Yes, ask: How was this problem treated? (e.g., medicines, counseling, etc.)     Feels like this anxiety is the worst it's ever been.  7. RISK OF HARM - SUICIDAL IDEATION: Do you ever have thoughts of hurting or killing yourself? If Yes, ask:  Do you have these feelings now? Do you have a plan on how you would do this?     No - never 8. TREATMENT:  What has been done so far to treat this anxiety? (e.g., medicines, relaxation strategies). What has helped?     Short walks, hot bath, only helps for a moment. Taking medications as prescribed. 9. TREATMENT - THERAPIST: Do you have a counselor or therapist? Name?     Not at this moment - still trying to find a therapist with availability  10. POTENTIAL TRIGGERS: Do you drink caffeinated beverages (e.g., coffee, colas, teas), and how much daily? Do you drink alcohol  or use any drugs? Have you started any new medicines recently?       Work event involving HR 11. PATIENT SUPPORT: Who is with you now? Who do you live with? Do you have family or friends who you can talk to?        Husband goes to work at [employer]. Not at the house right now, he is at an appt. Pt with her dog 12. OTHER SYMPTOMS: Do you have any other symptoms? (e.g., feeling depressed, trouble concentrating, trouble sleeping, trouble breathing, palpitations or fast heartbeat, chest pain, sweating, nausea, or diarrhea)       No SI, just constant worrying about the work situation and what to do. No HI. Just worry and feeling of anxiousness. The more anxious I am, the more depressed I am. Ears are stopped up, pt thinks she is getting sick. Has not showered in 3 days, but has not had to be anywhere. No alcohol  (does not drink alcohol ) or drug use. Pt very tearful.  Protocols used: Anxiety and Panic Attack-A-AH

## 2023-06-13 NOTE — Telephone Encounter (Signed)
 Last Fill: 01/26/2022 (Folic Acid ), 02/16/2023 (MTX)  Labs: 05/12/2023 Glucose is 127. Rest of CMP WNL HgbA1c 5.8% ESR WNL CRP WNL CBC WNL  Next Visit: 11/07/2023  Last Visit: 05/12/2023  DX: Sarcoidosis   Current Dose per office note 05/12/2023: methotrexate  0.8 mL sq injections once weekly and folic acid  2 mg daily   Okay to refill Methotrexate  and Folic Acid ?

## 2023-06-13 NOTE — Discharge Instructions (Signed)
 Today you are seen for anxiety and chest pain.  Please go to [facility] behavioral health center for further evaluation and treatment for anxiety.  Thank you for letting us  treat you today. After reviewing your labs and imaging, I feel you are safe to go home. Please follow up with your PCP in the next several days and provide them with your records from this visit. Return to the Emergency Room if pain becomes severe or symptoms worsen.

## 2023-06-14 ENCOUNTER — Encounter: Payer: Self-pay | Admitting: Internal Medicine

## 2023-06-14 ENCOUNTER — Ambulatory Visit: Payer: Commercial Managed Care - PPO | Admitting: Licensed Clinical Social Worker

## 2023-06-14 DIAGNOSIS — F411 Generalized anxiety disorder: Secondary | ICD-10-CM | POA: Diagnosis not present

## 2023-06-14 NOTE — Progress Notes (Signed)
 [facility] Counselor/Therapist Progress Note  Patient ID: [name], MRN: [id number]    Date: 06/14/23  Time Spent: 0308  pm - 0405 pm : 57 Minutes  Treatment Type: Individual Therapy.  Reported Symptoms: Patient reports a stressful work environment and reports that she often feels singled out at work. Patient states she has issues with anxiety and depression related to her current work situation.     Mental Status Exam: Appearance:  Casual     Behavior: Appropriate  Motor: Normal  Speech/Language:  Clear and Coherent  Affect: Appropriate  Mood: anxious  Thought process: normal  Thought content:   WNL  Sensory/Perceptual disturbances:   WNL  Orientation: oriented to person, place, time/date, situation, day of week, month of year, and year  Attention: Good  Concentration: Good  Memory: WNL  Fund of knowledge:  Good  Insight:   Good  Judgment:  Good  Impulse Control: Good   Risk Assessment: Danger to Self:  No Self-injurious Behavior: No Danger to Others: No Duty to Warn:no Physical Aggression / Violence:No  Access to Firearms a concern: No  Gang Involvement:No   Subjective:   [name] participated from office, located at [facility] with Clinician present. [name] consented to treatment.   Patient presented for her session with an anxious mood. Patient reports that she was in the ED yesterday evening. Patient reports having a heaviness in her chest and feeling overall unwell for several days. Patient reports that work again has been stressful. She reports an email was sent out by her supervisor to all the employees in their office. She reports the email had everyone's pay in the email. Patient reports that she as well as other employees were upset about their personal info being released to other employees. Patient reports that she said something about it but her peers remained quiet. Patient reports that later 2 of her superiors came to her  asking her to come to the office. She stated that she told them she wasn't comfortable due to a previous situation with one of the superiors. She stated that in front of all the employees in the office they continued to talk with her where the others could hear. She said they ended up taking others into the office to apologize for the email mishap. She said when her turn came she again said she didn't feel comfortable but again was ask in front of other employees. She states she went in and felt as though it was something that was scooped under the rug. Patient states she feels very stressed and as though she is harassed due to her being the one who says what she feels. Patient states she feels overwhelmed and doesn't know what she should do as far as returning to her job. She reports that she constantly feels ill and feels as though she is being forced out due to others behaviors.   Clinician encouraged patient to try not to over think what others are thinking or saying. Clinician urged patient to remind herself of the quality of her work knowing she does her job well and to the best of her ability. Clinician also encourage patient to take care of herself. Clinician and patient processed what taking care of herself looks like to her and how she can focus on that. We processed over thinking and how we can at times allow out minds to catastrophes situations more than what they are. Patient and Clinician processed coping skills to focus on her own  well being and assist her in knowing how to proceed. We discussed the following coping skills:  Take breaks: Plan regular breaks throughout the day to walk, chat with a colleague, or practice relaxation techniques.  Prioritize: Focus on tasks that will make the biggest difference and break projects into smaller chunks.  Set boundaries: Set healthy boundaries between work and personal life, such as not checking work emails at home.  Practice relaxation techniques: Try  meditation, deep breathing, or mindfulness.  Connect with others: Build a support network with friends, family, and acupuncturist.  Take care of yourself: Exercise, get enough sleep, and eat well.  Take time for yourself: Make time for activities you enjoy outside of work.  Communicate: Talk to your manager or someone else in your organization about how you're feeling.  Ask for help: If you're feeling overwhelmed, ask your manager or HR department for help.  Take a break from work: If things get too much, take a few days off or a long weekend.     Interventions: Cognitive Behavioral Therapy and Solution-Oriented/Positive Psychology  Diagnosis: Generalized Anxiety Disorder  Goals: Improve coping skills to deal with her stress at work and symptoms of anxiety and depression related to her current situation at her place of employment.   Target Date: 11/14/2023 Frequency: bi weekly  Progress: 0 Modality: individual      Therapist will provide referrals for additional resources as appropriate.  Therapist will provide psycho-education regarding anxiety and depression related to her current stressful situation at her place of employment. As well as corresponding treatment approaches and interventions. Licensed Clinical Social Worker, Damien Junk, LCSW will support the patient's ability to achieve the goals identified. will employ CBT, BA, Problem-solving, Solution Focused, Mindfulness,  coping skills, & other evidenced-based practices will be used to promote progress towards healthy functioning to help manage decrease symptoms associated with her diagnosis.   Reduce overall level, frequency, and intensity of the feelings of depression, anxiety and panic evidenced by decreased from 6 to 7 days/week to 0 to 2 days/week per client report for at least 3 consecutive months. Verbally express understanding of the relationship between feelings of depression, anxiety and their impact on thinking patterns and  behaviors. Verbalize an understanding of the role that distorted thinking plays in creating fears, excessive worry, and ruminations. 4. PT will practice distancing herself from negative thoughts related to work, utilize deep breathing and time management to assist her in coping with her stressful environment.             [name] participated in the creation of the treatment plan.    Damien Junk MSW, LCSW/DATE 06/14/2023

## 2023-06-16 ENCOUNTER — Ambulatory Visit: Payer: Commercial Managed Care - PPO | Admitting: Family Medicine

## 2023-06-16 ENCOUNTER — Encounter: Payer: Self-pay | Admitting: Family Medicine

## 2023-06-16 VITALS — BP 126/74 | HR 85 | Temp 97.6°F | Ht 67.0 in | Wt 312.0 lb

## 2023-06-16 DIAGNOSIS — F410 Panic disorder [episodic paroxysmal anxiety] without agoraphobia: Secondary | ICD-10-CM

## 2023-06-16 DIAGNOSIS — R079 Chest pain, unspecified: Secondary | ICD-10-CM

## 2023-06-16 DIAGNOSIS — F430 Acute stress reaction: Secondary | ICD-10-CM

## 2023-06-16 DIAGNOSIS — F4323 Adjustment disorder with mixed anxiety and depressed mood: Secondary | ICD-10-CM

## 2023-06-16 NOTE — Progress Notes (Signed)
 Subjective:     Patient ID: [name], female    DOB: 01-Sep-1970, 53 y.o.   MRN: [id number]  Chief Complaint  Patient presents with   Hospitalization Follow-up    06/13/23    HPI   History of Present Illness         Here to follow up on anxiety and what sounds like a panic attack. Her husband is with her.  She went to the ED for anxiety and chest pain. She also went to [facility] [place].  Situational anxiety is worse than usual.  She is requesting time off work to better manage  She is on sertraline  and Wellbutrin . She also has alprazolam  prn.   She has a therapist   She is being treated for acute sinusitis with Augmentin .    States she has a significant family hx of heart disease.     There are no preventive care reminders to display for this patient.   Past Medical History:  Diagnosis Date   Anxiety    Arthritis    Asthma    Back pain    Barrett's esophagus    Chemotherapy-induced neuropathy (HCC)    Chronic lower back pain    Depression    Dyspnea    Elevated blood pressure, situational 05/05/2016   Endometriosis    Exercise-induced asthma 09/08/2015   Fatigue    Gallbladder problem    GERD (gastroesophageal reflux disease)    Heartburn    History of kidney stones    Hypertension    Hypothyroidism    Infertility, female    Joint pain    Joint stiffness    Lower extremity edema    Migraine    ovarian cancer    Ovarian cancer, bilateral (HCC) 11/28/2014   PONV (postoperative nausea and vomiting)    Sarcoidosis    Sleep apnea    SOBOE (shortness of breath on exertion)    Thyroid  disease    Umbilical hernia    Vitamin D  deficiency    woke up while porta cath removed     Past Surgical History:  Procedure Laterality Date   ABDOMINAL HYSTERECTOMY  11/10/2014   at [facility], Exp lap, supracervical hyst, BSO, infracolic omentectomy, lymphadenectomy, aortic lymph node sampling   BIOPSY  11/04/2021   Procedure: BIOPSY;  Surgeon: Charlanne Groom, MD;   Location: [facility];  Service: Gastroenterology;;   BRONCHIAL NEEDLE ASPIRATION BIOPSY  12/16/2020   Procedure: BRONCHIAL NEEDLE ASPIRATION BIOPSIES;  Surgeon: Jude Harden GAILS, MD;  Location: [facility] ENDOSCOPY;  Service: Cardiopulmonary;;   BRONCHIAL WASHINGS  12/16/2020   Procedure: BRONCHIAL WASHINGS;  Surgeon: Jude Harden GAILS, MD;  Location: [facility] ENDOSCOPY;  Service: Cardiopulmonary;;   BUNIONECTOMY Left 09/14/2021   CARPAL TUNNEL RELEASE Left 09/07/2022   Procedure: LEFT CARPAL TUNNEL RELEASE, LEFT THUMB CARPOMETACARPAL INJECTION;  Surgeon: Jerri Kay HERO, MD;  Location: [facility];  Service: Orthopedics;  Laterality: Left;   CARPAL TUNNEL RELEASE Right 10/12/2022   Procedure: RIGHT CARPAL TUNNEL RELEASE, RIGHT THUMB CARPOMETACARPAL INJECTION;  Surgeon: Jerri Kay HERO, MD;  Location: [facility];  Service: Orthopedics;  Laterality: Right;   CHOLECYSTECTOMY N/A 07/24/2018   Procedure: LAPAROSCOPIC CHOLECYSTECTOMY WITH INTRAOPERATIVE CHOLANGIOGRAM;  Surgeon: Eletha Boas, MD;  Location: [facility];  Service: General;  Laterality: N/A;   COLONOSCOPY WITH PROPOFOL  N/A 11/04/2021   Procedure: COLONOSCOPY WITH PROPOFOL ;  Surgeon: Charlanne Groom, MD;  Location: [facility];  Service: Gastroenterology;  Laterality: N/A;   ERCP N/A 07/26/2018  Procedure: ENDOSCOPIC RETROGRADE CHOLANGIOPANCREATOGRAPHY (ERCP);  Surgeon: Aneita Gwendlyn DASEN, MD;  Location: [facility];  Service: Endoscopy;  Laterality: N/A;   ESOPHAGOGASTRODUODENOSCOPY (EGD) WITH PROPOFOL  N/A 11/04/2021   Procedure: ESOPHAGOGASTRODUODENOSCOPY (EGD) WITH PROPOFOL ;  Surgeon: Charlanne Groom, MD;  Location: [facility];  Service: Gastroenterology;  Laterality: N/A;   GANGLION CYST EXCISION Right    hand   HYSTERECTOMY ABDOMINAL WITH SALPINGO-OOPHORECTOMY  11/10/2014   ovarian cancer, tumor removal   INCISIONAL HERNIA REPAIR N/A 12/25/2020   Procedure: LAPAROSCOPIC INCISIONAL HERNIA REPAIR WITH MESH;  Surgeon: Kinsinger, Herlene Righter, MD;  Location: [facility];  Service: General;  Laterality: N/A;   ingrown nail removal     IR GENERIC HISTORICAL  05/12/2016   IR REMOVAL TUN ACCESS W/ PORT W/O FL MOD SED 05/12/2016 [facility]   LAPAROSCOPIC ENDOMETRIOSIS FULGURATION     LIPOMA EXCISION Left    ankle   meniscal tear Right    MENISCUS REPAIR Right    oral sugery     POLYPECTOMY  11/04/2021   Procedure: POLYPECTOMY;  Surgeon: Charlanne Groom, MD;  Location: [facility];  Service: Gastroenterology;;   Pat a cath removal     PORTA CATH INSERTION     REMOVAL OF STONES  07/26/2018   Procedure: REMOVAL OF STONES;  Surgeon: Aneita Gwendlyn DASEN, MD;  Location: [facility];  Service: Endoscopy;;   SPHINCTEROTOMY  07/26/2018   Procedure: ANNETT;  Surgeon: Aneita Gwendlyn DASEN, MD;  Location: [facility];  Service: Endoscopy;;   UMBILICAL HERNIA REPAIR  12/29/2020   VIDEO BRONCHOSCOPY WITH ENDOBRONCHIAL ULTRASOUND N/A 12/16/2020   Procedure: VIDEO BRONCHOSCOPY WITH ENDOBRONCHIAL ULTRASOUND;  Surgeon: Jude Harden GAILS, MD;  Location: [facility];  Service: Cardiopulmonary;  Laterality: N/A;    Family History  Problem Relation Age of Onset   Hyperlipidemia Mother    Hypertension Mother    Diabetes Mother    Stroke Mother        Deceased   Heart disease Mother    Osteoarthritis Mother    Atrial fibrillation Father        Living   Stroke Father        optic nerve left eye   Healthy Brother    Diabetes Maternal Aunt    Heart failure Maternal Grandmother    Heart attack Maternal Grandfather    Dementia Paternal Grandmother    Dementia Paternal Grandfather     Social History   Socioeconomic History   Marital status: Married    Spouse name: Okey   Number of children: 0   Years of education: Not on file   Highest education level: Not on file  Occupational History   Occupation: Midwife payable    Employer: [employer]  Tobacco Use   Smoking status: Never    Passive exposure: Past   Smokeless tobacco:  Never  Vaping Use   Vaping status: Never Used  Substance and Sexual Activity   Alcohol  use: Not Currently   Drug use: No   Sexual activity: Not on file  Other Topics Concern   Not on file  Social History Narrative   Lives with boyfriend in a one story home.  Has no children.     Works in marine scientist at [employer].     Education: college.   Right handed    Drinks caffeine   Social Drivers of Corporate Investment Banker Strain: Not on file  Food Insecurity: Not on file  Transportation Needs: Not on file  Physical Activity: Not on file  Stress: Not on file  Social Connections: Not on file  Intimate Partner Violence: Not on file    Outpatient Medications Prior to Visit  Medication Sig Dispense Refill   amphetamine -dextroamphetamine  (ADDERALL  XR) 20 MG 24 hr capsule Take 1 capsule (20 mg total) by mouth daily. 30 capsule 0   clobetasol  cream (TEMOVATE ) 0.05 % Apply 1 Application topically 2 (two) times daily as needed. 45 g 0   folic acid  (FOLVITE ) 1 MG tablet Take 2 tablets (2 mg total) by mouth daily. 180 tablet 3   gabapentin  (NEURONTIN ) 600 MG tablet Take 1.5 tablets (900 mg total) by mouth 2 (two) times daily. OK to take extra dose as needed 270 tablet 3   meloxicam  (MOBIC ) 7.5 MG tablet Take 1 tablet (7.5 mg total) by mouth 2 (two) times daily as needed for pain. 30 tablet 2   methotrexate  50 MG/2ML injection Inject 0.8 mLs (20 mg total) into the skin once a week. 10 mL 0   polyvinyl alcohol  (ARTIFICIAL TEARS) 1.4 % ophthalmic solution Place 1 drop into both eyes as needed for dry eyes. 15 mL 0   tiZANidine  (ZANAFLEX ) 4 MG tablet Take 1 tablet (4 mg total) by mouth at bedtime as needed for muscle spasms. 30 tablet 0   ALPRAZolam  (XANAX ) 1 MG tablet Take 1 tablet (1 mg total) by mouth 2 (two) times daily as needed for anxiety. 60 tablet 2   amoxicillin -clavulanate (AUGMENTIN ) 875-125 MG tablet Take 1 tablet by mouth every 12 (twelve) hours. 10 tablet 0   buPROPion  (WELLBUTRIN  XL) 300 MG 24  hr tablet Take 1 tablet (300 mg total) by mouth daily. 90 tablet 3   estradiol  (ESTRACE ) 1 MG tablet Take 1 tablet (1 mg total) by mouth daily. 90 tablet 3   fluticasone -salmeterol (ADVAIR  DISKUS) 250-50 MCG/ACT AEPB Inhale 1 puff into the lungs in the morning and at bedtime. 180 each 3   levothyroxine  (SYNTHROID ) 100 MCG tablet Take 1 tablet (100 mcg total) by mouth daily. 90 tablet 3   losartan  (COZAAR ) 25 MG tablet Take 1 tablet (25 mg total) by mouth daily. 90 tablet 3   omeprazole  (PRILOSEC) 40 MG capsule Take 1 capsule (40 mg total) by mouth daily. Please call [phone] to schedule an office visit for more refills 30 capsule 3   sertraline  (ZOLOFT ) 100 MG tablet Take 2 tablets (200 mg total) by mouth daily. 180 tablet 3   traZODone  (DESYREL ) 50 MG tablet Take 0.5-1 tablets (25-50 mg total) by mouth at bedtime as needed for sleep. 90 tablet 1   No facility-administered medications prior to visit.    Allergies  Allergen Reactions   Moxifloxacin Anaphylaxis    needs epinephrine  shot   Quinolones Anaphylaxis, Shortness Of Breath and Swelling   Sulfonamide Derivatives Hives, Swelling and Rash    needs epinephrine  shot--rash and lip swelling   Doxycycline Nausea And Vomiting   Escitalopram  Oxalate Other (See Comments)     fatigue   Lisinopril  Cough    Review of Systems  Constitutional:  Negative for chills and fever.  Respiratory:  Negative for shortness of breath.   Cardiovascular:  Positive for chest pain. Negative for palpitations and leg swelling.  Gastrointestinal:  Negative for abdominal pain, constipation, diarrhea, nausea and vomiting.  Neurological:  Negative for dizziness.  Psychiatric/Behavioral:  Negative for depression, substance abuse and suicidal ideas. The patient is nervous/anxious.        Objective:    Physical Exam Constitutional:      General:  She is not in acute distress.    Appearance: She is not ill-appearing.  Eyes:     Extraocular Movements:  Extraocular movements intact.     Conjunctiva/sclera: Conjunctivae normal.  Cardiovascular:     Rate and Rhythm: Normal rate and regular rhythm.  Pulmonary:     Effort: Pulmonary effort is normal.     Breath sounds: Normal breath sounds.  Musculoskeletal:     Cervical back: Normal range of motion and neck supple.     Right lower leg: No edema.     Left lower leg: No edema.  Skin:    General: Skin is warm and dry.  Neurological:     General: No focal deficit present.     Mental Status: She is alert and oriented to person, place, and time.     Motor: No weakness.     Coordination: Coordination normal.     Gait: Gait normal.  Psychiatric:        Mood and Affect: Mood normal.        Behavior: Behavior normal.        Thought Content: Thought content normal.      BP 126/74 (BP Location: Left Arm, Patient Position: Sitting, Cuff Size: Large)   Pulse 85   Temp 97.6 F (36.4 C) (Temporal)   Ht 5' 7 (1.702 m)   Wt (!) 312 lb (141.5 kg)   LMP 11/02/2014   SpO2 95%   BMI 48.87 kg/m  Wt Readings from Last 3 Encounters:  06/19/23 (!) 318 lb 3.2 oz (144.3 kg)  06/16/23 (!) 312 lb (141.5 kg)  05/23/23 (!) 325 lb (147.4 kg)       Assessment & Plan:   Problem List Items Addressed This Visit     Adjustment disorder with mixed anxiety and depressed mood   Other Visit Diagnoses       Panic attack as reaction to stress    -  Primary     Chest pain, unspecified type       Relevant Orders   EKG 12-Lead      EKG shows NSR, rate 70, nonspecific ST abnormality  Reviewed ED notes and results.  Continue counseling, taking medications including alprazolam  prn.  Follow up with PCP Monday. She may need a cardiac work up or referral. She is concerned about family hx of heart disease and recent chest pain symptoms.   I am having Jon Chimes maintain her polyvinyl alcohol , clobetasol  cream, tiZANidine , gabapentin , meloxicam , amphetamine -dextroamphetamine , folic acid , and  methotrexate .  No orders of the defined types were placed in this encounter.

## 2023-06-16 NOTE — Patient Instructions (Signed)
 Continue your current medications.  Use alprazolam  as needed.  Please follow-up with Dr. Autry Legions on Monday.

## 2023-06-19 ENCOUNTER — Encounter: Payer: Self-pay | Admitting: Internal Medicine

## 2023-06-19 ENCOUNTER — Ambulatory Visit (INDEPENDENT_AMBULATORY_CARE_PROVIDER_SITE_OTHER): Payer: Commercial Managed Care - PPO | Admitting: Internal Medicine

## 2023-06-19 ENCOUNTER — Other Ambulatory Visit (HOSPITAL_BASED_OUTPATIENT_CLINIC_OR_DEPARTMENT_OTHER): Payer: Self-pay

## 2023-06-19 VITALS — BP 120/82 | HR 81 | Temp 98.8°F | Ht 67.0 in | Wt 318.2 lb

## 2023-06-19 DIAGNOSIS — Z23 Encounter for immunization: Secondary | ICD-10-CM | POA: Diagnosis not present

## 2023-06-19 DIAGNOSIS — I10 Essential (primary) hypertension: Secondary | ICD-10-CM | POA: Diagnosis not present

## 2023-06-19 DIAGNOSIS — F411 Generalized anxiety disorder: Secondary | ICD-10-CM

## 2023-06-19 DIAGNOSIS — R739 Hyperglycemia, unspecified: Secondary | ICD-10-CM | POA: Diagnosis not present

## 2023-06-19 DIAGNOSIS — E559 Vitamin D deficiency, unspecified: Secondary | ICD-10-CM | POA: Diagnosis not present

## 2023-06-19 DIAGNOSIS — F419 Anxiety disorder, unspecified: Secondary | ICD-10-CM

## 2023-06-19 DIAGNOSIS — R9431 Abnormal electrocardiogram [ECG] [EKG]: Secondary | ICD-10-CM | POA: Diagnosis not present

## 2023-06-19 DIAGNOSIS — E038 Other specified hypothyroidism: Secondary | ICD-10-CM | POA: Diagnosis not present

## 2023-06-19 DIAGNOSIS — R7303 Prediabetes: Secondary | ICD-10-CM | POA: Diagnosis not present

## 2023-06-19 DIAGNOSIS — Z0001 Encounter for general adult medical examination with abnormal findings: Secondary | ICD-10-CM

## 2023-06-19 DIAGNOSIS — Z0000 Encounter for general adult medical examination without abnormal findings: Secondary | ICD-10-CM

## 2023-06-19 DIAGNOSIS — L309 Dermatitis, unspecified: Secondary | ICD-10-CM

## 2023-06-19 MED ORDER — SERTRALINE HCL 100 MG PO TABS
200.0000 mg | ORAL_TABLET | Freq: Every day | ORAL | 3 refills | Status: AC
  Filled 2023-06-19 – 2023-08-24 (×2): qty 180, 90d supply, fill #0
  Filled 2024-03-05: qty 180, 90d supply, fill #1

## 2023-06-19 MED ORDER — LEVOTHYROXINE SODIUM 100 MCG PO TABS
100.0000 ug | ORAL_TABLET | Freq: Every day | ORAL | 3 refills | Status: DC
  Filled 2023-06-19 – 2023-07-31 (×2): qty 90, 90d supply, fill #0

## 2023-06-19 MED ORDER — FLUTICASONE-SALMETEROL 250-50 MCG/ACT IN AEPB
1.0000 | INHALATION_SPRAY | Freq: Two times a day (BID) | RESPIRATORY_TRACT | 3 refills | Status: AC
  Filled 2023-06-19 – 2023-07-31 (×2): qty 180, 90d supply, fill #0

## 2023-06-19 MED ORDER — LOSARTAN POTASSIUM 25 MG PO TABS
25.0000 mg | ORAL_TABLET | Freq: Every day | ORAL | 3 refills | Status: AC
  Filled 2023-06-19 – 2023-07-31 (×2): qty 90, 90d supply, fill #0
  Filled 2023-10-25: qty 90, 90d supply, fill #1
  Filled 2024-01-19: qty 90, 90d supply, fill #2
  Filled 2024-05-06: qty 90, 90d supply, fill #3

## 2023-06-19 MED ORDER — TRAZODONE HCL 50 MG PO TABS
25.0000 mg | ORAL_TABLET | Freq: Every evening | ORAL | 1 refills | Status: DC | PRN
  Filled 2023-06-19 – 2023-10-25 (×3): qty 90, 90d supply, fill #0
  Filled 2024-01-31: qty 90, 90d supply, fill #1

## 2023-06-19 MED ORDER — OMEPRAZOLE 40 MG PO CPDR
40.0000 mg | DELAYED_RELEASE_CAPSULE | Freq: Every day | ORAL | 3 refills | Status: AC
  Filled 2023-06-19 – 2023-06-23 (×2): qty 90, 90d supply, fill #0
  Filled 2023-09-21: qty 90, 90d supply, fill #1
  Filled 2024-01-09: qty 90, 90d supply, fill #2
  Filled 2024-04-11: qty 90, 90d supply, fill #3

## 2023-06-19 MED ORDER — PREDNISONE 10 MG PO TABS
ORAL_TABLET | ORAL | 0 refills | Status: AC
  Filled 2023-06-19: qty 18, 9d supply, fill #0

## 2023-06-19 MED ORDER — BUPROPION HCL ER (XL) 300 MG PO TB24
300.0000 mg | ORAL_TABLET | Freq: Every day | ORAL | 3 refills | Status: AC
  Filled 2023-06-19 – 2023-08-24 (×2): qty 90, 90d supply, fill #0
  Filled 2023-12-02: qty 90, 90d supply, fill #1
  Filled 2024-04-11: qty 90, 90d supply, fill #2

## 2023-06-19 MED ORDER — ESTRADIOL 1 MG PO TABS
1.0000 mg | ORAL_TABLET | Freq: Every day | ORAL | 3 refills | Status: AC
  Filled 2023-06-19 – 2023-07-31 (×2): qty 90, 90d supply, fill #0
  Filled 2023-10-25: qty 90, 90d supply, fill #1
  Filled 2024-01-31: qty 90, 90d supply, fill #2
  Filled 2024-05-06: qty 90, 90d supply, fill #3

## 2023-06-19 MED ORDER — ALPRAZOLAM 1 MG PO TABS
1.0000 mg | ORAL_TABLET | Freq: Two times a day (BID) | ORAL | 2 refills | Status: DC | PRN
  Filled 2023-06-19 – 2023-08-04 (×3): qty 60, 30d supply, fill #0
  Filled 2023-09-27: qty 60, 30d supply, fill #1
  Filled 2023-10-25 – 2023-12-02 (×2): qty 60, 30d supply, fill #2

## 2023-06-19 NOTE — Assessment & Plan Note (Signed)
Last vitamin D Lab Results  Component Value Date   VD25OH 50 06/17/2022   Stable, cont oral replacement

## 2023-06-19 NOTE — Assessment & Plan Note (Signed)
 BP Readings from Last 3 Encounters:  06/19/23 120/82  06/16/23 126/74  06/13/23 131/74   Stable, pt to continue medical treatment losartan  25  qd

## 2023-06-19 NOTE — Assessment & Plan Note (Signed)

## 2023-06-19 NOTE — Assessment & Plan Note (Signed)
 Lab Results  Component Value Date   HGBA1C 5.8 (H) 05/12/2023   Stable, pt to continue current medical treatment  - diet, wt control

## 2023-06-19 NOTE — Assessment & Plan Note (Signed)
 With worsening flare, for prednisone  taper

## 2023-06-19 NOTE — Assessment & Plan Note (Signed)
 Lab Results  Component Value Date   TSH 3.36 06/17/2022   Stable, pt to continue levothyroxine  2100 mcg qd

## 2023-06-19 NOTE — Progress Notes (Signed)
 Patient ID: [name], female   DOB: 1971-03-11, 53 y.o.   MRN: [id number]         Chief Complaint:: wellness exam and Medical Management of Chronic Issues (Anxiety follow up, concerns with heart. Patient has been experiencing new life stressors (changes at work) that have been elevating anxiety for the past week-2 weeks. Has been having what she believes are panic attacks (jaws clenching, sense of impending doom, pressure on chest).)  , eczema, generalized anxiety d/o with panic, htn, low thyroid , preDM, low vit d       HPI:  [name] is a 53 y.o. female here for wellness exam; up to date               Also had recent increased anxiety work related > 1 yr, finally unable to even go to work jan 30 to date, was seen feb 4 in ED with panic some improved with counseling since then.  Pt denies chest pain, increased sob or doe, wheezing, orthopnea, PND, increased LE swelling, palpitations, dizziness or syncope.   Pt denies polydipsia, polyuria, or new focal neuro s/s.    Pt denies fever, wt loss, night sweats, loss of appetite, or other constitutional symptoms  Asking for Card ct score due to strong FH heart disease.  Also asks for prednisone  taper with strong flare eczema rash to arms and torso.  Due for prevnar 20   Wt Readings from Last 3 Encounters:  06/19/23 (!) 318 lb 3.2 oz (144.3 kg)  06/16/23 (!) 312 lb (141.5 kg)  05/23/23 (!) 325 lb (147.4 kg)   BP Readings from Last 3 Encounters:  06/19/23 120/82  06/16/23 126/74  06/13/23 131/74   Immunization History  Administered Date(s) Administered   Influenza Inj Mdck Quad Pf 02/09/2017   Influenza,inj,Quad PF,6+ Mos 01/02/2014, 03/26/2015, 12/25/2017, 01/21/2020, 01/25/2021   Influenza-Unspecified 02/23/2016, 02/09/2017, 02/12/2020, 02/14/2022, 02/20/2023   MMR 02/09/2017, 07/03/2017   PFIZER(Purple Top)SARS-COV-2 Vaccination 08/01/2019, 08/22/2019, 02/22/2020   PNEUMOCOCCAL CONJUGATE-20 06/19/2023   Tdap 11/07/2017   Tetanus  12/21/2015   Zoster Recombinant(Shingrix ) 06/22/2021, 11/26/2021   There are no preventive care reminders to display for this patient.     Past Medical History:  Diagnosis Date   Anxiety    Arthritis    Asthma    Back pain    Barrett's esophagus    Chemotherapy-induced neuropathy (HCC)    Chronic lower back pain    Depression    Dyspnea    Elevated blood pressure, situational 05/05/2016   Endometriosis    Exercise-induced asthma 09/08/2015   Fatigue    Gallbladder problem    GERD (gastroesophageal reflux disease)    Heartburn    History of kidney stones    Hypertension    Hypothyroidism    Infertility, female    Joint pain    Joint stiffness    Lower extremity edema    Migraine    ovarian cancer    Ovarian cancer, bilateral (HCC) 11/28/2014   PONV (postoperative nausea and vomiting)    Sarcoidosis    Sleep apnea    SOBOE (shortness of breath on exertion)    Thyroid  disease    Umbilical hernia    Vitamin D  deficiency    woke up while porta cath removed    Past Surgical History:  Procedure Laterality Date   ABDOMINAL HYSTERECTOMY  11/10/2014   at [facility], Exp lap, supracervical hyst, BSO, infracolic omentectomy, lymphadenectomy, aortic lymph node sampling   BIOPSY  11/04/2021  Procedure: BIOPSY;  Surgeon: Lajuan Pila, MD;  Location: [facility];  Service: Gastroenterology;;   BRONCHIAL NEEDLE ASPIRATION BIOPSY  12/16/2020   Procedure: BRONCHIAL NEEDLE ASPIRATION BIOPSIES;  Surgeon: Lind Repine, MD;  Location: [facility];  Service: Cardiopulmonary;;   BRONCHIAL WASHINGS  12/16/2020   Procedure: BRONCHIAL WASHINGS;  Surgeon: Lind Repine, MD;  Location: [facility] END[facility]COPY;  Service: Cardiopulmonary;;   BUNIONECTOMY Left 09/14/2021   CARPAL TUNNEL RELEASE Left 09/07/2022   Procedure: LEFT CARPAL TUNNEL RELEASE, LEFT THUMB CARPOMETACARPAL INJECTION;  Surgeon: Wes Hamman, MD;  Location: [facility];  Service: Orthopedics;  Laterality: Left;   CARPAL  TUNNEL RELEASE Right 10/12/2022   Procedure: RIGHT CARPAL TUNNEL RELEASE, RIGHT THUMB CARPOMETACARPAL INJECTION;  Surgeon: Wes Hamman, MD;  Location: [facility];  Service: Orthopedics;  Laterality: Right;   CHOLECYSTECTOMY N/A 07/24/2018   Procedure: LAPAROSCOPIC CHOLECYSTECTOMY WITH INTRAOPERATIVE CHOLANGIOGRAM;  Surgeon: Oralee Billow, MD;  Location: [facility];  Service: General;  Laterality: N/A;   COLONOSCOPY WITH PROPOFOL  N/A 11/04/2021   Procedure: COLONOSCOPY WITH PROPOFOL ;  Surgeon: Lajuan Pila, MD;  Location: [facility];  Service: Gastroenterology;  Laterality: N/A;   ERCP N/A 07/26/2018   Procedure: ENDOSCOPIC RETROGRADE CHOLANGIOPANCREATOGRAPHY (ERCP);  Surgeon: Asencion Blacksmith, MD;  Location: [facility];  Service: Endoscopy;  Laterality: N/A;   ESOPHAGOGASTRODUODENOSCOPY (EGD) WITH PROPOFOL  N/A 11/04/2021   Procedure: ESOPHAGOGASTRODUODENOSCOPY (EGD) WITH PROPOFOL ;  Surgeon: Lajuan Pila, MD;  Location: [facility];  Service: Gastroenterology;  Laterality: N/A;   GANGLION CYST EXCISION Right    hand   HYSTERECTOMY ABDOMINAL WITH SALPINGO-OOPHORECTOMY  11/10/2014   ovarian cancer, tumor removal   INCISIONAL HERNIA REPAIR N/A 12/25/2020   Procedure: LAPAROSCOPIC INCISIONAL HERNIA REPAIR WITH MESH;  Surgeon: Kinsinger, Alphonso Aschoff, MD;  Location: [facility];  Service: General;  Laterality: N/A;   ingrown nail removal     IR GENERIC HISTORICAL  05/12/2016   IR REMOVAL TUN ACCESS W/ PORT W/O FL MOD SED 05/12/2016 [facility]   LAPAROSCOPIC ENDOMETRIOSIS FULGURATION     LIPOMA EXCISION Left    ankle   meniscal tear Right    MENISCUS REPAIR Right    oral sugery     POLYPECTOMY  11/04/2021   Procedure: POLYPECTOMY;  Surgeon: Lajuan Pila, MD;  Location: [facility];  Service: Gastroenterology;;   Melville Stade a cath removal     PORTA CATH INSERTION     REMOVAL OF STONES  07/26/2018   Procedure: REMOVAL OF STONES;  Surgeon: Asencion Blacksmith, MD;  Location: [facility];   Service: Endoscopy;;   SPHINCTEROTOMY  07/26/2018   Procedure: [address];  Surgeon: Asencion Blacksmith, MD;  Location: [facility];  Service: Endoscopy;;   UMBILICAL HERNIA REPAIR  12/29/2020   VIDEO BRONCHOSCOPY WITH ENDOBRONCHIAL ULTRASOUND N/A 12/16/2020   Procedure: VIDEO BRONCHOSCOPY WITH ENDOBRONCHIAL ULTRASOUND;  Surgeon: Lind Repine, MD;  Location: [facility];  Service: Cardiopulmonary;  Laterality: N/A;    reports that she has never smoked. She has been exposed to tobacco smoke. She has never used smokeless tobacco. She reports that she does not currently use alcohol . She reports that she does not use drugs. family history includes Atrial fibrillation in her father; Dementia in her paternal grandfather and paternal grandmother; Diabetes in her maternal aunt and mother; Healthy in her brother; Heart attack in her maternal grandfather; Heart disease in her mother; Heart failure in her maternal grandmother; Hyperlipidemia in her mother; Hypertension in her mother; Osteoarthritis in her mother; Stroke in her  father and mother. Allergies  Allergen Reactions   Moxifloxacin Anaphylaxis    needs epinephrine  shot   Quinolones Anaphylaxis, Shortness Of Breath and Swelling   Sulfonamide Derivatives Hives, Swelling and Rash    needs epinephrine  shot--rash and lip swelling   Doxycycline Nausea And Vomiting   Escitalopram  Oxalate Other (See Comments)     fatigue   Lisinopril  Cough   Current Outpatient Medications on File Prior to Visit  Medication Sig Dispense Refill   amphetamine -dextroamphetamine  (ADDERALL  XR) 20 MG 24 hr capsule Take 1 capsule (20 mg total) by mouth daily. 30 capsule 0   clobetasol  cream (TEMOVATE ) 0.05 % Apply 1 Application topically 2 (two) times daily as needed. 45 g 0   folic acid  (FOLVITE ) 1 MG tablet Take 2 tablets (2 mg total) by mouth daily. 180 tablet 3   gabapentin  (NEURONTIN ) 600 MG tablet Take 1.5 tablets (900 mg total) by mouth 2 (two) times daily. OK to  take extra dose as needed 270 tablet 3   meloxicam  (MOBIC ) 7.5 MG tablet Take 1 tablet (7.5 mg total) by mouth 2 (two) times daily as needed for pain. 30 tablet 2   methotrexate  50 MG/2ML injection Inject 0.8 mLs (20 mg total) into the skin once a week. 10 mL 0   polyvinyl alcohol  (ARTIFICIAL TEARS) 1.4 % ophthalmic solution Place 1 drop into both eyes as needed for dry eyes. 15 mL 0   tiZANidine  (ZANAFLEX ) 4 MG tablet Take 1 tablet (4 mg total) by mouth at bedtime as needed for muscle spasms. 30 tablet 0   No current facility-administered medications on file prior to visit.        ROS:  All others reviewed and negative.  Objective        PE:  BP 120/82   Pulse 81   Temp 98.8 F (37.1 C)   Ht 5\' 7"  (1.702 m)   Wt (!) 318 lb 3.2 oz (144.3 kg)   LMP 11/02/2014   SpO2 99%   BMI 49.84 kg/m                 Constitutional: Pt appears in NAD               HENT: Head: NCAT.                Right Ear: External ear normal.                 Left Ear: External ear normal.                Eyes: . Pupils are equal, round, and reactive to light. Conjunctivae and EOM are normal               Nose: without d/c or deformity               Neck: Neck supple. Gross normal ROM               Cardiovascular: Normal rate and regular rhythm.                 Pulmonary/Chest: Effort normal and breath sounds without rales or wheezing.                Abd:  Soft, NT, ND, + BS, no organomegaly               Neurological: Pt is alert. At baseline orientation, motor grossly intact  Skin: Skin is warm. No rashes, no other new lesions, LE edema - none               Psychiatric: Pt behavior is normal without agitation   Micro: none  Cardiac tracings I have personally interpreted today:  none  Pertinent Radiological findings (summarize): none   Lab Results  Component Value Date   WBC 11.0 (H) 06/13/2023   HGB 14.2 06/13/2023   HCT 42.2 06/13/2023   PLT 305 06/13/2023   GLUCOSE 88 06/13/2023    CHOL 152 06/17/2022   TRIG 108 06/17/2022   HDL 42 (L) 06/17/2022   LDLCALC 90 06/17/2022   ALT 15 05/12/2023   AST 14 05/12/2023   NA 137 06/13/2023   K 3.8 06/13/2023   CL 103 06/13/2023   CREATININE 0.75 06/13/2023   BUN 13 06/13/2023   CO2 26 06/13/2023   TSH 3.36 06/17/2022   INR 0.90 05/12/2016   HGBA1C 5.8 (H) 05/12/2023   Assessment/Plan:  [name] is a 53 y.o. White or Caucasian [1] female with  has a past medical history of Anxiety, Arthritis, Asthma, Back pain, Barrett's esophagus, Chemotherapy-induced neuropathy (HCC), Chronic lower back pain, Depression, Dyspnea, Elevated blood pressure, situational (05/05/2016), Endometriosis, Exercise-induced asthma (09/08/2015), Fatigue, Gallbladder problem, GERD (gastroesophageal reflux disease), Heartburn, History of kidney stones, Hypertension, Hypothyroidism, Infertility, female, Joint pain, Joint stiffness, Lower extremity edema, Migraine, ovarian cancer, Ovarian cancer, bilateral (HCC) (11/28/2014), PONV (postoperative nausea and vomiting), Sarcoidosis, Sleep apnea, SOBOE (shortness of breath on exertion), Thyroid  disease, Umbilical hernia, Vitamin D  deficiency, and woke up while porta cath removed.  Encounter for well adult exam with abnormal findings Age and sex appropriate education and counseling updated with regular exercise and diet Referrals for preventative services - none needed Immunizations addressed - none needed Smoking counseling  - none needed Evidence for depression or other mood disorder - none significant Most recent labs reviewed. I have personally reviewed and have noted: 1) the patient's medical and social history 2) The patient's current medications and supplements 3) The patient's height, weight, and BMI have been recorded in the chart   Eczema With worsening flare, for prednisone  taper  Generalized anxiety disorder With recent worsening, for cont'd zoloft  200 every day, xanax  1 mg bid prn,  counsleing, and FMLA for off work from Jan 30 to a return to work date of Jul 03 2023 due to illness.  Pt may need to look for new job position or even different employment as appears to have perceived workplace conflict that will not resolve soon.    HTN (hypertension) BP Readings from Last 3 Encounters:  06/19/23 120/82  06/16/23 126/74  06/13/23 131/74   Stable, pt to continue medical treatment losartan  25  qd   Hypothyroidism Lab Results  Component Value Date   TSH 3.36 06/17/2022   Stable, pt to continue levothyroxine  2100 mcg qd   Prediabetes Lab Results  Component Value Date   HGBA1C 5.8 (H) 05/12/2023   Stable, pt to continue current medical treatment  - diet, wt control   Vitamin D  deficiency Last vitamin D  Lab Results  Component Value Date   VD25OH 50 06/17/2022   Stable, cont oral replacement  Followup: Return in about 6 months (around 12/17/2023).  Rosalia Colonel, MD 06/19/2023 8:27 PM [facility]

## 2023-06-19 NOTE — Assessment & Plan Note (Signed)
 With recent worsening, for cont'd zoloft  200 every day, xanax  1 mg bid prn, counsleing, and FMLA for off work from Jan 30 to a return to work date of Jul 03 2023 due to illness.  Pt may need to look for new job position or even different employment as appears to have perceived workplace conflict that will not resolve soon.

## 2023-06-19 NOTE — Patient Instructions (Addendum)
 You had the Prevnar 20 pneumonia  shot  Please take all new medication as prescribed - the prednisone   Please continue all other medications as before, and refills have been done if requested.  Please have the pharmacy call with any other refills you may need.  Please continue your efforts at being more active, low cholesterol diet, and weight control.  You are otherwise up to date with prevention measures today.  Please keep your appointments with your specialists as you may have planned  You will be contacted regarding the referral for: Cardiac CT score  We can fill out the FMLA form  Please make an Appointment to return in 6 months, or sooner if needed

## 2023-06-20 ENCOUNTER — Telehealth: Payer: Self-pay

## 2023-06-20 NOTE — Telephone Encounter (Signed)
Copied from CRM [id number]. Topic: General - Other >> Jun 20, 2023  9:57 AM [place] A wrote: Reason for CRM: Patient called because she had a visit yesterday with Dr.John and she submitted FMLA documents to be completed. Patient was calling to check status of paperwork

## 2023-06-20 NOTE — Telephone Encounter (Signed)
Called and let Pt know since paperwork was just dropped off yesterday, we have 7-10 business days to complete paperwork.

## 2023-06-20 NOTE — Telephone Encounter (Signed)
FMLA paperwork has been completed and faxed today.

## 2023-06-21 ENCOUNTER — Ambulatory Visit: Payer: Commercial Managed Care - PPO | Admitting: Licensed Clinical Social Worker

## 2023-06-21 DIAGNOSIS — F411 Generalized anxiety disorder: Secondary | ICD-10-CM | POA: Diagnosis not present

## 2023-06-22 NOTE — Progress Notes (Signed)
[facility] Counselor/Therapist Progress Note  Patient ID: [name], MRN: [id number]    Date: 06/21/2023  Time Spent: 0205  pm - 0300 pm : 55 Minutes  Treatment Type: Individual Therapy.  Reported Symptoms: Patient reports a stressful work environment and reports that she often feels singled out at work. Patient states she has issues with anxiety and depression related to her current work situation.      Mental Status Exam: Appearance:  Casual     Behavior: Appropriate  Motor: Normal  Speech/Language:  Clear and Coherent  Affect: Appropriate  Mood: anxious  Thought process: normal  Thought content:   WNL  Sensory/Perceptual disturbances:   WNL  Orientation: oriented to person, place, time/date, situation, day of week, month of year, and year  Attention: Good  Concentration: Good  Memory: WNL  Fund of knowledge:  Good  Insight:   Good  Judgment:  Good  Impulse Control: Good    Risk Assessment: Danger to Self:  No Self-injurious Behavior: No Danger to Others: No Duty to Warn:no Physical Aggression / Violence:No  Access to Firearms a concern: No  Gang Involvement:No    Subjective:    [name] participated from office, located at [facility] with Clinician present. [name] consented to treatment.   [name] presented for her session in a calm manner and was smiling. Patient reports feeling much better that her previous session. [name] identified that she has spoke with her spouse and she feels that she needs to leave her job. Patient was insightful in identifying the pros and cons of remaining at her current job. Patient states that she wants to remain on good terms with her employer in the event she would like to get hired ina different area. Patient was able to identify the improvement in her mood and stress level since making this decision.  Clinician provided support via active listening and verbal interaction. Clinician encouraged patient to  consider all options and speak to her superiors to see if there is an option to get transferred to another department. [name] was in a better place and not tearful as in her previous session. Clinician observed a noticeable change in patients demeanor and an improved mood and outlook.  Patient was eager to make positive changes. She was able to identify the weight that has been lifted knowing she doesn't have to return to her previous work environment. Patient was insightful as she identified the importance of her mental health and the role it plays in every aspect of her life. Patient agreed to continue to work toward her treatment goals.  Goals: Improve coping skills to deal with her stress at work and symptoms of anxiety and depression related to her current situation at her place of employment.   Target Date: 11/14/2023 Frequency: bi weekly  Progress: 0 Modality: individual      Therapist will provide referrals for additional resources as appropriate.  Therapist will provide psycho-education regarding anxiety and depression related to her current stressful situation at her place of employment. As well as corresponding treatment approaches and interventions. Licensed Clinical Social Worker, Phyllis Ginger, LCSW will support the patient's ability to achieve the goals identified. will employ CBT, BA, Problem-solving, Solution Focused, Mindfulness,  coping skills, & other evidenced-based practices will be used to promote progress towards healthy functioning to help manage decrease symptoms associated with her diagnosis.   Reduce overall level, frequency, and intensity of the feelings of depression, anxiety and panic evidenced by decreased from 6 to 7  days/week to 0 to 2 days/week per client report for at least 3 consecutive months. Verbally express understanding of the relationship between feelings of depression, anxiety and their impact on thinking patterns and behaviors. Verbalize an understanding of  the role that distorted thinking plays in creating fears, excessive worry, and ruminations. 4. PT will practice distancing herself from negative thoughts related to work, utilize deep breathing and time management to assist her in coping with her stressful environment.    Interventions: Cognitive Behavioral Therapy and Solution-Oriented/Positive Psychology  Diagnosis: Generalized Anxiety Disorder   Phyllis Ginger MSW, LCSW/DATE 06/21/2023

## 2023-06-23 ENCOUNTER — Other Ambulatory Visit (HOSPITAL_BASED_OUTPATIENT_CLINIC_OR_DEPARTMENT_OTHER): Payer: Self-pay

## 2023-06-23 ENCOUNTER — Other Ambulatory Visit: Payer: Self-pay | Admitting: Neurology

## 2023-06-23 ENCOUNTER — Other Ambulatory Visit: Payer: Self-pay

## 2023-06-24 ENCOUNTER — Other Ambulatory Visit (HOSPITAL_BASED_OUTPATIENT_CLINIC_OR_DEPARTMENT_OTHER): Payer: Self-pay

## 2023-06-24 ENCOUNTER — Other Ambulatory Visit: Payer: Self-pay | Admitting: Neurology

## 2023-06-28 ENCOUNTER — Encounter (HOSPITAL_BASED_OUTPATIENT_CLINIC_OR_DEPARTMENT_OTHER): Payer: Self-pay

## 2023-06-28 ENCOUNTER — Ambulatory Visit: Payer: Commercial Managed Care - PPO | Admitting: Licensed Clinical Social Worker

## 2023-06-28 ENCOUNTER — Other Ambulatory Visit (HOSPITAL_BASED_OUTPATIENT_CLINIC_OR_DEPARTMENT_OTHER): Payer: Self-pay

## 2023-06-28 DIAGNOSIS — F411 Generalized anxiety disorder: Secondary | ICD-10-CM | POA: Diagnosis not present

## 2023-06-28 NOTE — Progress Notes (Unsigned)
[facility] Counselor/Therapist Progress Note  Patient ID: [name], MRN: [id number]    Date: 06/28/23  Time Spent: 0400  pm - 0500 pm : 60 Minutes  Treatment Type: Individual Therapy.  Reported Symptoms: Patient reports a stressful work environment and reports that she often feels singled out at work. Patient states she has issues with anxiety and depression related to her current work situation.      Mental Status Exam: Appearance:  Casual     Behavior: Appropriate  Motor: Normal  Speech/Language:  Clear and Coherent  Affect: Appropriate  Mood: anxious  Thought process: normal  Thought content:   WNL  Sensory/Perceptual disturbances:   WNL  Orientation: oriented to person, place, time/date, situation, day of week, month of year, and year  Attention: Good  Concentration: Good  Memory: WNL  Fund of knowledge:  Good  Insight:   Good  Judgment:  Good  Impulse Control: Good    Risk Assessment: Danger to Self:  No Self-injurious Behavior: No Danger to Others: No Duty to Warn:no Physical Aggression / Violence:No  Access to Firearms a concern: No  Gang Involvement:No    Subjective:    [name] participated from home via video. Patient was aware of risks and limitation with virtual sessions. [name] consented to therapy session. Clinician participated from her home office located in [place]. Virtual session was scheduled due to  inclimate weather conditions in the area.   [name] presented for her session in a positive mood. Patient reiterated her feelings about not returning to her work and how although she is glad she doesn't have to return to that environment, she hates to leave her job. Patient is concerned about not being able to get rehired with [employer]. Patient reports liking working for [employer] and hopes that she can find another position within [employer]. Patient states that she has become more aware of how her job was affecting not just her mental  health but her physical well being as well. Patient reports that she was one who wasn't quiet when there were safety issues and concerns within the office. She reports that she feels that her supervisors didn't like that someone was bringing these concerns to light. [name] discussed her goal to go back to school and her desire to get her LCSW.   Clinician processed with patient her feelings and provided validation via active listening and verbal interaction. Clinician identified that her schooling in social work has shown forth in her advocacy for change and her ability to speak up. Clinician encouraged patient to reach to a superior that may be able to assist her in changing departments or at least who could identify if she is eligible for rehire.   Patient is open to feedback and is proactive in taking responsibility for assuring she handles things in a professional manner. [name]  is eager to get back to herself without the stress she has been under at her place of employment. [name] has identified instances where she felt that she was harassed and treated poorly due to her speaking up. Patient is gaining strength and is recognizing her worth and her right to happiness and an environment that she doesn't find uncomfortable. Patient has an improved mood and outlook during this session that she was lacking in her previous session. Patient will continue to work toward improving coping skills to deal with her stress at work and symptoms of anxiety and depression related to her current situation at her place of employment. Patient  will continue with therapy weekly to bi-weekly depending upon patient need. Treatment plan will be reviewed by 11/14/2023.   Target Date: 11/14/2023 Frequency: bi weekly  Progress: 0 Modality: individual      Therapist will provide referrals for additional resources as appropriate.  Therapist will provide psycho-education regarding anxiety and depression related to her current  stressful situation at her place of employment. As well as corresponding treatment approaches and interventions. Licensed Clinical Social Worker, Phyllis Ginger, LCSW will support the patient's ability to achieve the goals identified. will employ CBT, BA, Problem-solving, Solution Focused, Mindfulness,  coping skills, & other evidenced-based practices will be used to promote progress towards healthy functioning to help manage decrease symptoms associated with her diagnosis.   Reduce overall level, frequency, and intensity of the feelings of depression, anxiety and panic evidenced by decreased from 6 to 7 days/week to 0 to 2 days/week per client report for at least 3 consecutive months. Verbally express understanding of the relationship between feelings of depression, anxiety and their impact on thinking patterns and behaviors. Verbalize an understanding of the role that distorted thinking plays in creating fears, excessive worry, and ruminations. 4. PT will practice distancing herself from negative thoughts related to work, utilize deep breathing and time management to assist her in coping with her stressful environment.   Interventions: Cognitive Behavioral Therapy and Solution-Oriented/Positive Psychology   Diagnosis: Generalized Anxiety Disorder    Phyllis Ginger MSW, LCSW/DATE

## 2023-06-29 ENCOUNTER — Telehealth: Payer: Self-pay | Admitting: Neurology

## 2023-06-29 ENCOUNTER — Telehealth: Payer: Self-pay | Admitting: Internal Medicine

## 2023-06-29 ENCOUNTER — Other Ambulatory Visit (HOSPITAL_BASED_OUTPATIENT_CLINIC_OR_DEPARTMENT_OTHER): Payer: Self-pay

## 2023-06-29 MED ORDER — TIZANIDINE HCL 4 MG PO TABS
4.0000 mg | ORAL_TABLET | Freq: Every evening | ORAL | 4 refills | Status: AC | PRN
  Filled 2023-06-29: qty 30, 30d supply, fill #0
  Filled 2023-08-04: qty 30, 30d supply, fill #1
  Filled 2023-09-21: qty 30, 30d supply, fill #2
  Filled 2023-11-09: qty 30, 30d supply, fill #3
  Filled 2023-12-14: qty 30, 30d supply, fill #4
  Filled ????-??-??: fill #4

## 2023-06-29 NOTE — Telephone Encounter (Signed)
Called patient and informed her that her Tizanidine has been sent to her pharmacy as requested.

## 2023-06-29 NOTE — Telephone Encounter (Signed)
Patient called AN and LM. She is complaining of back pain. She needs her gabapentin 600mg  refilled and her tizandidine 4mg  refilled  [facility]

## 2023-06-29 NOTE — Telephone Encounter (Signed)
Called patient and she stated that she does not need the gabapentin refill but is wondering if Dr. Allena Katz can send in Tizanidine for her? She stated at her last visit she was told to stop the flexeril and try tizanidine. Patient is wanting a rx sent to [facility].

## 2023-06-29 NOTE — Telephone Encounter (Signed)
Tizanidine sent to [facility].

## 2023-06-29 NOTE — Telephone Encounter (Signed)
 Copied from CRM [id number]. Topic: General - Other >> Jun 29, 2023  4:28 PM Mosetta Putt H wrote: Reason for CRM: need fl2 form filled out by [employer]

## 2023-06-30 ENCOUNTER — Ambulatory Visit: Payer: Self-pay | Admitting: Internal Medicine

## 2023-06-30 NOTE — Telephone Encounter (Addendum)
Copied from CRM [id number]. Topic: Clinical - Red Word Triage >> Jun 30, 2023  3:34 PM Irine Seal wrote: [facility] that prompted transfer to Nurse Triage: Patient calling to inquire about completing the second portion of their FMLA paperwork.   Triaging because the patient is having stress, anxiety and heart palpitation, wants to speak with nurse in regards to expediting the paperwork, informed her that it would be done as soon as possible, she just wants to ask further questions on her end   Patient stated that she was recently in the ED for panic attacks. She has been stressed out and having a lot of anxiety surrounding her job. She states that she has been on medical leave since having her ED visit. Patient stated that she is still having the same symptoms of panic attacks. Patient said that recent events have occurred at her job and she is apprehensive about returning. She is due to return to work on Monday. Just the thought of returning to work is causing her to feel anxious, heart beating fast, and chest feel heavy. Patient stated that Dr. Jonny Ruiz is aware of the situation and her symptoms. Patient is requesting for her medical leave to be extended.  Patient stated her FMLA form was not filled out correctly the first time. She stated that it is incomplete and the 2nd page needs to state who she was referred to for this problem and it needs to be updated to include the counselor's info. Patient is aware that a message will be sent and the office will follow up with her.

## 2023-06-30 NOTE — Telephone Encounter (Signed)
Copied from CRM [id number]. Topic: General - Other >> Jun 29, 2023  4:28 PM Mosetta Putt H wrote: Reason for CRM: need fl2 form filled out by [employer] >> Jun 30, 2023  3:33 PM Irine Seal wrote: Patient calling to inquire about completing the second portion of their FMLA paperwork. The paperwork was previously rejected due to incomplete information on the second page. Patient is requesting an extension as they are due back to work Monday 07/03/23  but does not feel comfortable returning  at this time due to heart palpitations.  Patients counselor advised to give PCP her information Phyllis Ginger.

## 2023-07-03 ENCOUNTER — Telehealth: Payer: Self-pay

## 2023-07-03 NOTE — Telephone Encounter (Signed)
 Patient called to check on the status of their paperwork. They spoke with someone earlier today, who confirmed the paperwork was not completed yet. Patient is calling back and said she would like to speak with a manager about this. Best callback is [phone].

## 2023-07-03 NOTE — Telephone Encounter (Signed)
 Spoke with patient made aware we have paperwork and will completed. Even thought this is an update to previous paperwork we do ask for 7-10 business days to complete.  Patient stated she is needing section 11 completed along with a request for 2 more weeks of time out of work due to her anxiety.  She originally had 2/10-2/23 and wants it extended until 3/9.   The paperwork needs to be completed by 2/27 and faxed back to the [facility] [phone] AND a copy printed and put at front desk for patient. Please call her when all is ready.

## 2023-07-03 NOTE — Telephone Encounter (Signed)
 Form has been placed on provider desk.

## 2023-07-05 ENCOUNTER — Ambulatory Visit: Payer: Commercial Managed Care - PPO | Admitting: Licensed Clinical Social Worker

## 2023-07-05 DIAGNOSIS — F411 Generalized anxiety disorder: Secondary | ICD-10-CM | POA: Diagnosis not present
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
## 2023-07-06 NOTE — Telephone Encounter (Signed)
 Form no longer needed, Pt states it has been completed by a different provider.

## 2023-07-07 NOTE — Telephone Encounter (Signed)
 Hello - I responded to pt with with an email yesterday  Pt is at risk for losing her job  I recall her FMLA I filled out last wk was returned to me, and there was a message that the question of who else health provider was seeing the patient for her problem was not filled out.  I did not know this information, so I sent the form back to you to ask pt and get this oinfo   I was hoping you have been able to contact pt so we can finish filling out the form to her satisfaction in time for her not to lose her job.  (and also with an extended time off work in the continuous leave part)   thanks

## 2023-07-12 ENCOUNTER — Ambulatory Visit (INDEPENDENT_AMBULATORY_CARE_PROVIDER_SITE_OTHER): Payer: Commercial Managed Care - PPO | Admitting: Licensed Clinical Social Worker

## 2023-07-12 DIAGNOSIS — F411 Generalized anxiety disorder: Secondary | ICD-10-CM | POA: Diagnosis not present
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
## 2023-07-15 ENCOUNTER — Other Ambulatory Visit: Payer: Self-pay | Admitting: Physician Assistant

## 2023-07-15 ENCOUNTER — Other Ambulatory Visit: Payer: Self-pay | Admitting: Internal Medicine

## 2023-07-16 ENCOUNTER — Other Ambulatory Visit: Payer: Self-pay

## 2023-07-17 ENCOUNTER — Other Ambulatory Visit (HOSPITAL_BASED_OUTPATIENT_CLINIC_OR_DEPARTMENT_OTHER): Payer: Self-pay

## 2023-07-17 MED ORDER — AMPHETAMINE-DEXTROAMPHET ER 20 MG PO CP24
20.0000 mg | ORAL_CAPSULE | Freq: Every day | ORAL | 0 refills | Status: DC
  Filled 2023-07-17: qty 30, 30d supply, fill #0

## 2023-07-17 MED ORDER — CLOBETASOL PROPIONATE 0.05 % EX CREA
1.0000 | TOPICAL_CREAM | Freq: Two times a day (BID) | CUTANEOUS | 0 refills | Status: AC | PRN
  Filled 2023-07-17: qty 45, 30d supply, fill #0

## 2023-07-17 NOTE — Telephone Encounter (Signed)
 Last Fill: 11/29/2022  Next Visit: 11/07/2023  Last Visit: 05/12/2023  Dx: Sarcoidosis   Current Dose per office note on 05/12/2023: not mentioned  Okay to refill Clobetasol Cream?

## 2023-07-19 ENCOUNTER — Ambulatory Visit: Payer: Commercial Managed Care - PPO | Admitting: Licensed Clinical Social Worker

## 2023-07-19 DIAGNOSIS — F411 Generalized anxiety disorder: Secondary | ICD-10-CM | POA: Diagnosis not present
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
## 2023-07-26 ENCOUNTER — Ambulatory Visit (INDEPENDENT_AMBULATORY_CARE_PROVIDER_SITE_OTHER): Payer: Commercial Managed Care - PPO | Admitting: Licensed Clinical Social Worker

## 2023-07-26 DIAGNOSIS — F411 Generalized anxiety disorder: Secondary | ICD-10-CM
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
## 2023-08-01 ENCOUNTER — Other Ambulatory Visit (HOSPITAL_BASED_OUTPATIENT_CLINIC_OR_DEPARTMENT_OTHER): Payer: Self-pay

## 2023-08-02 ENCOUNTER — Ambulatory Visit (INDEPENDENT_AMBULATORY_CARE_PROVIDER_SITE_OTHER): Admitting: Licensed Clinical Social Worker

## 2023-08-02 DIAGNOSIS — F411 Generalized anxiety disorder: Secondary | ICD-10-CM | POA: Diagnosis not present

## 2023-08-02 NOTE — Progress Notes (Signed)
 [facility] Counselor/Therapist Progress Note  Patient ID: [name], MRN: [id number]    Date: 08/02/23  Time Spent: 0906  am - 1000 am : 54 Minutes  Treatment Type: Individual Therapy.  Reported Symptoms: Patient reports a stressful work environment and reports that she often feels singled out at work. Patient states she has issues with anxiety and depression related to her current work situation.      Mental Status Exam: Appearance:  Casual     Behavior: Appropriate  Motor: Normal  Speech/Language:  Clear and Coherent  Affect: Appropriate  Mood: anxious  Thought process: normal  Thought content:   WNL  Sensory/Perceptual disturbances:   WNL  Orientation: oriented to person, place, time/date, situation, day of week, month of year, and year  Attention: Good  Concentration: Good  Memory: WNL  Fund of knowledge:  Good  Insight:   Good  Judgment:  Good  Impulse Control: Good    Risk Assessment: Danger to Self:  No Self-injurious Behavior: No Danger to Others: No Duty to Warn:no Physical Aggression / Violence:No  Access to Firearms a concern: No  Gang Involvement:No    Subjective:    [name] participated from office located at [facility] with Clinician present. Alizzon consented to therapy session.    [name] presented for her session reporting that she feels a part of her identity is missing. [name] reports that she has worked for so long that she is struggling to find things to do in order to be productive. 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 Patient states she is struggling between finding a job and possibly going back to school for her MSW.   Clinician provided support and encouragement via active listening and verbal feedback. Clinician identified that patient is insightful and is motivated to make positive changes in her life. Clinician encouraged patient to look into  MSW programs that she feels may provide the schedule she needs. Clinician encouraged patient to follow her dream and not let fear stand in her way. Clinician processed with patient the importance of focusing what is best for her at this time in her life and how it will affect her family as a whole.   Patient is very engaged and motivated for treatment. Patient is insightful and weighing out her options for her future. Patient will continue to focus and work on coping skills for her symptoms of anxiety. [name] will continue with biweekly therapy and treatment plan will be reviewed by 11/14/2023. Patient will work toward: Building Healthy Relationships: Choose your connections carefully: Surround yourself with people who value you and provide support.  Communicate openly and honestly: Share your thoughts and feelings with trusted individuals.  Learn healthy relationship boundaries: Establish clear limits to protect yourself from potential hurt.  Focus on the present, not the past: While understanding your past can be helpful, it's important to concentrate on developing strong relationships in the present.  Consider professional help: If past abandonment experiences significantly affect your relationships, seeking therapy can provide valuable support and guidance.  Challenging Negative Thoughts and Cultivating Mindfulness: Recognize and challenge negative thought patterns: Identify and question the validity of negative thoughts about being worthy of love and connection.  Replace negative self-talk with positive affirmations: Practice self-compassion and speak kindly to yourself.  Mindfulness practices: Focus on the present moment to reduce anxiety and emotional reactivity.     Diagnosis: Generalized Anxiety Disorder  Interventions: Cognitive Behavioral Therapy, Assertiveness/Communication, and Interpersonal  Phyllis Ginger MSW, LCSW/DATE 08/02/2023

## 2023-08-04 ENCOUNTER — Other Ambulatory Visit: Payer: Self-pay

## 2023-08-07 ENCOUNTER — Encounter: Payer: Self-pay | Admitting: Oncology

## 2023-08-08 ENCOUNTER — Ambulatory Visit (INDEPENDENT_AMBULATORY_CARE_PROVIDER_SITE_OTHER): Admitting: Family Medicine

## 2023-08-08 ENCOUNTER — Encounter: Payer: Self-pay | Admitting: Family Medicine

## 2023-08-08 ENCOUNTER — Encounter: Payer: Self-pay | Admitting: Oncology

## 2023-08-08 ENCOUNTER — Other Ambulatory Visit (HOSPITAL_BASED_OUTPATIENT_CLINIC_OR_DEPARTMENT_OTHER): Payer: Self-pay

## 2023-08-08 VITALS — BP 125/85 | HR 83 | Temp 98.0°F | Ht 67.0 in | Wt 309.0 lb

## 2023-08-08 DIAGNOSIS — E559 Vitamin D deficiency, unspecified: Secondary | ICD-10-CM | POA: Diagnosis not present

## 2023-08-08 DIAGNOSIS — F329 Major depressive disorder, single episode, unspecified: Secondary | ICD-10-CM | POA: Diagnosis not present

## 2023-08-08 DIAGNOSIS — Z6841 Body Mass Index (BMI) 40.0 and over, adult: Secondary | ICD-10-CM

## 2023-08-08 DIAGNOSIS — G4733 Obstructive sleep apnea (adult) (pediatric): Secondary | ICD-10-CM | POA: Diagnosis not present

## 2023-08-08 DIAGNOSIS — E88819 Insulin resistance, unspecified: Secondary | ICD-10-CM | POA: Diagnosis not present

## 2023-08-08 DIAGNOSIS — R7303 Prediabetes: Secondary | ICD-10-CM

## 2023-08-08 DIAGNOSIS — E66813 Obesity, class 3: Secondary | ICD-10-CM

## 2023-08-08 DIAGNOSIS — Z0289 Encounter for other administrative examinations: Secondary | ICD-10-CM

## 2023-08-08 MED ORDER — TIRZEPATIDE 2.5 MG/0.5ML ~~LOC~~ SOAJ
2.5000 mg | SUBCUTANEOUS | 0 refills | Status: DC
  Filled 2023-08-08: qty 2, 28d supply, fill #0

## 2023-08-08 NOTE — Progress Notes (Signed)
 Office: [phone]  /  Fax: [phone]  WEIGHT SUMMARY AND BIOMETRICS  Starting Date: 11/16/21  Starting Weight: 333lb   Weight Lost Since Last Visit: 0lb   Vitals Temp: 98 F (36.7 C) BP: 125/85 Pulse Rate: 83 SpO2: 97 %   Body Composition  Body Fat %: 57.3 % Fat Mass (lbs): 177.2 lbs Muscle Mass (lbs): 125.4 lbs Visceral Fat Rating : 20   HPI  Chief Complaint: OBESITY  [name] is here to discuss her progress with her obesity treatment plan. She is on the practicing portion control and making smarter food choices, such as increasing vegetables and decreasing simple carbohydrates and states she is following her eating plan approximately 50 % of the time. She states she is exercising 30-60 minutes 3 times per week.  Interval History:  Since last office visit she is up 13 lb She was last seen in July (8 mos ago) She is ready to get back on track with her weight Her husband is supportive She left a toxic job in late Jan and her mental health has improved She has been in counseling She is cooking more at home and has been getting in more fruits and veggies She previously had success on Wegovy but stopped due to lack of coverage She has been doing some exercise at [employer] including water aerobics, walking and Zumba. She is currently looking for a job  Pharmacotherapy: None  PHYSICAL EXAM:  Blood pressure 125/85, pulse 83, temperature 98 F (36.7 C), height 5\' 7"  (1.702 m), weight (!) 309 lb (140.2 kg), last menstrual period 11/02/2014, SpO2 97%. Body mass index is 48.4 kg/m.  General: She is overweight, cooperative, alert, well developed, and in no acute distress. PSYCH: Has normal mood, affect and thought process.   Lungs: Normal breathing effort, no conversational dyspnea.   ASSESSMENT AND PLAN  TREATMENT PLAN FOR OBESITY:  Recommended Dietary Goals  [name] is currently in the action stage of change. As such, her goal is to continue weight  management plan. She has agreed to keeping a food journal and adhering to recommended goals of 1500 calories and 100 to 120 g of protein and practicing portion control and making smarter food choices, such as increasing vegetables and decreasing simple carbohydrates. Recommend tracking daily intake using the lose it app  specifics listed on after visit summary, reviewed with patient today  Behavioral Intervention  We discussed the following Behavioral Modification Strategies today: increasing lean protein intake to established goals, increasing fiber rich foods, increasing water intake , work on meal planning and preparation, work on tracking and journaling calories using tracking application, reading food labels , keeping healthy foods at home, work on managing stress, creating time for self-care and relaxation, avoiding temptations and identifying enticing environmental cues, continue to practice mindfulness when eating, planning for success, and continue to work on maintaining a reduced calorie state, getting the recommended amount of protein, incorporating whole foods, making healthy choices, staying well hydrated and practicing mindfulness when eating..  Additional resources provided today: NA  Recommended Physical Activity Goals  [name] has been advised to work up to 150 minutes of moderate intensity aerobic activity a week and strengthening exercises 2-3 times per week for cardiovascular health, weight loss maintenance and preservation of muscle mass.   She has agreed to Start aerobic activity with a goal of 150 minutes a week at moderate intensity.   Schedule in walking or gym workouts for total of 5 days a week  Pharmacotherapy changes for the  treatment of obesity: Begin Mounjaro, off label at 2.5 mg once weekly injection  ASSOCIATED CONDITIONS ADDRESSED TODAY  Insulin resistance Patient has a history of insulin resistance.  She has never used metformin.  She has more recently started  working on reducing her intake of added sugar and starchy foods.  She has been more consistent with gym workouts now that she is out of work.  She had some success using Wegovy for obesity management over 1 year ago but stopped due to lack of insurance coverage.  We discussed use of Mounjaro.  Patient is interested.  We have discussed mechanism of action and potential for side effects.  She denies a personal or family history of pancreatitis, medullary thyroid carcinoma or multiple endocrine neoplasia type II.  She may review pen training video on [email or web address]. Update fasting insulin and chemistry panel next visit  -     Tirzepatide; Inject 2.5 mg into the skin once a week.  Dispense: 2 mL; Refill: 0  Prediabetes Lab Results  Component Value Date   HGBA1C 5.8 (H) 05/12/2023   Begin working on dietary tracking using the lose it app aiming for 1500 cal/day which should include 100 g or more of protein daily.  Schedule an exercise at least 30 minutes 5 days a week.  Repeat A1c and fasting insulin next visit  -     Tirzepatide; Inject 2.5 mg into the skin once a week.  Dispense: 2 mL; Refill: 0  Class 3 severe obesity due to excess calories with serious comorbidity and body mass index (BMI) of 45.0 to 49.9 in adult [facility]) Patient seems mentally ready to restart her plan for medically supervised weight management.  Given her history and BMI of 48, will closely look for improvements and weight reduction over time.  If unable to lose at least 5% of her total body weight over the next 3 to 4 months, we will discuss the option of weight loss surgery.  Return in 3-4 weeks for fasting IC  Vitamin D deficiency Last vitamin D Lab Results  Component Value Date   VD25OH 50 06/17/2022  Improved.  She has a history of vitamin D deficiency.  Her vitamin D has remained good on an over-the-counter vitamin D3 supplement.  Continue vitamin D3 at 2000 IU once daily.  Recheck level in fall 2025  Major depressive  disorder, remission status unspecified, unspecified whether recurrent Mood has improved away from her job.  She is currently in counseling and reports taking her Wellbutrin XL 300 mg once daily as prescribed along with sertraline 200 mg once daily.  She has a good support system at home and is currently working on stress reduction.  Advised keeping junk food trigger foods out of the house, working on self-care, high-quality sleep at night and eating on a schedule.  OSA on CPAP She is on CPAP nightly for OSA.  Aim for 7 to 8 hours of high-quality sleep at night.  Begin active plan for weight reduction.     She was informed of the importance of frequent follow up visits to maximize her success with intensive lifestyle modifications for her multiple health conditions.   ATTESTASTION STATEMENTS:  Reviewed by clinician on day of visit: allergies, medications, problem list, medical history, surgical history, family history, social history, and previous encounter notes pertinent to obesity diagnosis.   I have personally spent 30 minutes total time today in preparation, patient care, nutritional counseling and documentation for this visit, including the following: review of  clinical lab tests; review of medical tests/procedures/services.      [name], DO DABFM, DABOM [facility] [address] [place], [place] [address] [phone]

## 2023-08-08 NOTE — Patient Instructions (Signed)
 Repeat fasting IC next visit (water only that morning)  Let's work on approval for [employer] tracking daily calorie intake using the Lose It App or MyNet Diary Aim for 1500 cal/ day This should include 100 g of protein daily  Avoid high sugar foods and drinks  Increase intake of non starchy veggies  Schedule in exercise: walking / water exercise/ group exercise for a TOTAL of 5 days/ wk  Aim for 8 hrs of sleep at night

## 2023-08-09 ENCOUNTER — Telehealth: Payer: Self-pay | Admitting: *Deleted

## 2023-08-09 ENCOUNTER — Encounter: Payer: Self-pay | Admitting: Oncology

## 2023-08-09 NOTE — Telephone Encounter (Signed)
Prior authorization done via cover my meds for patients Mounjaro. Waiting on determination.  

## 2023-08-10 ENCOUNTER — Other Ambulatory Visit (HOSPITAL_BASED_OUTPATIENT_CLINIC_OR_DEPARTMENT_OTHER): Payer: Self-pay

## 2023-08-14 ENCOUNTER — Ambulatory Visit (INDEPENDENT_AMBULATORY_CARE_PROVIDER_SITE_OTHER): Admitting: Licensed Clinical Social Worker

## 2023-08-14 DIAGNOSIS — F411 Generalized anxiety disorder: Secondary | ICD-10-CM

## 2023-08-14 NOTE — Telephone Encounter (Signed)
 Ok thank you

## 2023-08-14 NOTE — Telephone Encounter (Signed)
 Prior authorization denied because patient does not have type 2 diabetes. Patient notified.
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
## 2023-08-23 ENCOUNTER — Encounter: Payer: Self-pay | Admitting: Oncology

## 2023-08-24 ENCOUNTER — Other Ambulatory Visit (HOSPITAL_BASED_OUTPATIENT_CLINIC_OR_DEPARTMENT_OTHER): Payer: Self-pay

## 2023-08-24 ENCOUNTER — Other Ambulatory Visit: Payer: Self-pay

## 2023-08-24 ENCOUNTER — Other Ambulatory Visit: Payer: Self-pay | Admitting: Internal Medicine

## 2023-08-24 ENCOUNTER — Other Ambulatory Visit: Payer: Self-pay | Admitting: Orthopaedic Surgery

## 2023-08-24 MED ORDER — MELOXICAM 7.5 MG PO TABS
7.5000 mg | ORAL_TABLET | Freq: Two times a day (BID) | ORAL | 2 refills | Status: DC | PRN
  Filled 2023-08-24: qty 30, 15d supply, fill #0
  Filled 2023-09-27: qty 30, 15d supply, fill #1
  Filled 2023-12-02: qty 30, 15d supply, fill #2

## 2023-08-24 MED ORDER — AMPHETAMINE-DEXTROAMPHET ER 20 MG PO CP24
20.0000 mg | ORAL_CAPSULE | Freq: Every day | ORAL | 0 refills | Status: DC
  Filled 2023-08-24: qty 30, 30d supply, fill #0

## 2023-08-28 ENCOUNTER — Ambulatory Visit: Admitting: Licensed Clinical Social Worker

## 2023-09-04 ENCOUNTER — Encounter: Payer: Self-pay | Admitting: Oncology

## 2023-09-04 ENCOUNTER — Ambulatory Visit (INDEPENDENT_AMBULATORY_CARE_PROVIDER_SITE_OTHER): Admitting: Family Medicine

## 2023-09-04 ENCOUNTER — Encounter: Payer: Self-pay | Admitting: Family Medicine

## 2023-09-04 VITALS — BP 120/82 | HR 78 | Temp 97.8°F | Ht 67.0 in | Wt 304.0 lb

## 2023-09-04 DIAGNOSIS — R7303 Prediabetes: Secondary | ICD-10-CM

## 2023-09-04 DIAGNOSIS — E038 Other specified hypothyroidism: Secondary | ICD-10-CM | POA: Diagnosis not present

## 2023-09-04 DIAGNOSIS — Z6841 Body Mass Index (BMI) 40.0 and over, adult: Secondary | ICD-10-CM

## 2023-09-04 DIAGNOSIS — E88819 Insulin resistance, unspecified: Secondary | ICD-10-CM

## 2023-09-04 DIAGNOSIS — F5089 Other specified eating disorder: Secondary | ICD-10-CM

## 2023-09-04 DIAGNOSIS — R0602 Shortness of breath: Secondary | ICD-10-CM

## 2023-09-04 DIAGNOSIS — E66813 Obesity, class 3: Secondary | ICD-10-CM | POA: Diagnosis not present

## 2023-09-04 NOTE — Progress Notes (Signed)
 Office: [phone]  /  Fax: [phone]  WEIGHT SUMMARY AND BIOMETRICS  Starting Date: 11/16/21  Starting Weight: 333lb   Weight Lost Since Last Visit: 5lb   Vitals Temp: 97.8 F (36.6 C) BP: 120/82 Pulse Rate: 78 SpO2: 93 %   Body Composition  Body Fat %: 55.7 % Fat Mass (lbs): 169.4 lbs Muscle Mass (lbs): 127.8 lbs Visceral Fat Rating : 20    HPI  Chief Complaint: OBESITY  Dennine is here to discuss her progress with her obesity treatment plan. She is on the keeping a food journal and adhering to recommended goals of 1500 calories and 100-120 protein and states she is following her eating plan approximately 50 % of the time. She states she is exercising 30+ minutes 4 times per week.  Interval History:  Since last office visit she is down 5 lb She was unable to start Moun[employer]  due to cost She has been more active and mindful of food choices She has not yet started formal exercise She is not tracking her calories She is looking for work She is eating more meals at home and her husband has been supportive  Pharmacotherapy: none  PHYSICAL EXAM:  Blood pressure 120/82, pulse 78, temperature 97.8 F (36.6 C), height 5\' 7"  (1.702 m), weight (!) 304 lb (137.9 kg), last menstrual period 11/02/2014, SpO2 93%. Body mass index is 47.61 kg/m.  General: She is overweight, cooperative, alert, well developed, and in no acute distress. PSYCH: Has normal mood, affect and thought process.   Lungs: Normal breathing effort, no conversational dyspnea.   ASSESSMENT AND PLAN  TREATMENT PLAN FOR OBESITY:  Recommended Dietary Goals  Jamilla is currently in the action stage of change. As such, her goal is to continue weight management plan. She has agreed to practicing portion control and making smarter food choices, such as increasing vegetables and decreasing simple carbohydrates. Option to track daily kcal intake : 1800 cal/ day to include 120 g of protein  daily  Behavioral Intervention  We discussed the following Behavioral Modification Strategies today: increasing lean protein intake to established goals, increasing fiber rich foods, increasing water intake , work on meal planning and preparation, keeping healthy foods at home, identifying sources and decreasing liquid calories, work on managing stress, creating time for self-care and relaxation, avoiding temptations and identifying enticing environmental cues, continue to practice mindfulness when eating, and continue to work on maintaining a reduced calorie state, getting the recommended amount of protein, incorporating whole foods, making healthy choices, staying well hydrated and practicing mindfulness when eating.. Check out [email or web address] for recipe ideas, demonstrated website navigation  Additional resources provided today: NA  Recommended Physical Activity Goals  Arethea has been advised to work up to 150 minutes of moderate intensity aerobic activity a week and strengthening exercises 2-3 times per week for cardiovascular health, weight loss maintenance and preservation of muscle mass.   She has agreed to [employer] strengthening exercises with a goal of 2-3 sessions a week  and Start aerobic activity with a goal of 150 minutes a week at moderate intensity.  Check out [facility] Right Start program  Pharmacotherapy changes for the treatment of obesity: none  ASSOCIATED CONDITIONS ADDRESSED TODAY  SOBOE (shortness of breath on exertion) Reviewed IC results with patient Metabolic rate has improved from 2059 to 2304 in the past 1.5 years even after 30 lb of weight loss Continue to work on:  7-8 hrs of high quality sleep at night Lean protein intake with meals and snacks  Cardio and resistance training on a regular basis  Insulin  resistance Continue to work on reducing intake of added sugar and starches.  Increase walking time and resistance training.  Repeat fasting insulin  today -      Insulin , random  Prediabetes -     Hemoglobin A1c  Other specified hypothyroidism Continue levothyroxine  100 mcg once daily per Dr. Autry Legions -     TSH Rfx on Abnormal to Free T4  Class 3 severe obesity due to excess calories with serious comorbidity and body mass index (BMI) of 45.0 to 49.9 in adult [facility]) Jonni is highly motivated to continue working on healthy lifestyle changes.  She does lack insurance coverage for antiobesity medications making use of a GLP-1 receptor too expensive.  She is currently on Wellbutrin  but not a candidate for Contrave.  She is currently on Adderall  is not a candidate candidate for any stimulant type antiobesity medications.  She has declined the option for weight loss surgery at this point in time.  Remains motivated to continue working on medically supervised weight management.  Other disorder of eating/emotional eating She is currently in counseling and stress level has reduced and she is no longer in a toxic job.  She has good support system at home.  Continue to work on eating on schedule, meal planning and portion control.  Incorporate lean protein and fiber with meals and snacks.  Added more regular exercise to include outdoor walking, gym workouts.  Recommend [facility] start program.     She was informed of the importance of frequent follow up visits to maximize her success with intensive lifestyle modifications for her multiple health conditions.   ATTESTASTION STATEMENTS:  Reviewed by clinician on day of visit: allergies, medications, problem list, medical history, surgical history, family history, social history, and previous encounter notes pertinent to obesity diagnosis.   I have personally spent 32 minutes total time today in preparation, patient care, nutritional counseling and education,  and documentation for this visit, including the following: review of most recent clinical lab tests, reviewing IC results,  reviewing medical assistant  documentation, review and interpretation of bioimpedence results.     Micky Albee, D.O. DABFM, DABOM [facility] [address] [place], [place] [address] [phone]

## 2023-09-05 LAB — HEMOGLOBIN A1C
Est. average glucose Bld gHb Est-mCnc: 111 mg/dL
Hgb A1c MFr Bld: 5.5 % (ref 4.8–5.6)

## 2023-09-05 LAB — TSH RFX ON ABNORMAL TO FREE T4: TSH: 6.08 u[IU]/mL — ABNORMAL HIGH (ref 0.450–4.500)

## 2023-09-05 LAB — T4F: T4,Free (Direct): 1.16 ng/dL (ref 0.82–1.77)

## 2023-09-05 LAB — INSULIN, RANDOM: INSULIN: 16.7 u[IU]/mL (ref 2.6–24.9)

## 2023-09-06 ENCOUNTER — Other Ambulatory Visit: Payer: Self-pay

## 2023-09-06 ENCOUNTER — Other Ambulatory Visit (HOSPITAL_BASED_OUTPATIENT_CLINIC_OR_DEPARTMENT_OTHER): Payer: Self-pay

## 2023-09-06 MED ORDER — LEVOTHYROXINE SODIUM 112 MCG PO TABS
112.0000 ug | ORAL_TABLET | Freq: Every day | ORAL | 3 refills | Status: AC
  Filled 2023-09-06: qty 90, 90d supply, fill #0
  Filled 2023-12-02: qty 90, 90d supply, fill #1
  Filled 2024-03-28: qty 90, 90d supply, fill #2

## 2023-09-06 NOTE — Addendum Note (Signed)
 Addended by: Roslyn Coombe on: 09/06/2023 09:40 AM   Modules accepted: Orders

## 2023-09-13 ENCOUNTER — Encounter: Payer: Self-pay | Admitting: Oncology

## 2023-09-18 ENCOUNTER — Ambulatory Visit: Admitting: Licensed Clinical Social Worker

## 2023-09-21 ENCOUNTER — Other Ambulatory Visit (HOSPITAL_BASED_OUTPATIENT_CLINIC_OR_DEPARTMENT_OTHER): Payer: Self-pay

## 2023-09-23 ENCOUNTER — Other Ambulatory Visit (HOSPITAL_BASED_OUTPATIENT_CLINIC_OR_DEPARTMENT_OTHER): Payer: Self-pay

## 2023-09-25 ENCOUNTER — Ambulatory Visit: Payer: Self-pay | Admitting: Licensed Clinical Social Worker

## 2023-09-25 DIAGNOSIS — F411 Generalized anxiety disorder: Secondary | ICD-10-CM | POA: Diagnosis not present

## 2023-09-25 NOTE — Progress Notes (Signed)
 [facility] Counselor/Therapist Progress Note  Patient ID: [name], MRN: [id number]    Date: 09/25/23  Time Spent: 0809  am - 0900 am : 51 Minutes  Treatment Type: Individual Therapy.  Reported Symptoms: Patient reports a stressful work environment and reports that she often feels singled out at work. Patient states she has issues with anxiety and depression related to her previous work situation.   Mental Status Exam: Appearance:  Casual     Behavior: Appropriate  Motor: Normal  Speech/Language:  Clear and Coherent  Affect: Appropriate  Mood: anxious  Thought process: normal  Thought content:   WNL  Sensory/Perceptual disturbances:   WNL  Orientation: oriented to person, place, time/date, situation, day of week, month of year, and year  Attention: Good  Concentration: Good  Memory: WNL  Fund of knowledge:  Good  Insight:   Good  Judgment:  Good  Impulse Control: Good    Risk Assessment: Danger to Self:  No Self-injurious Behavior: No Danger to Others: No Duty to Warn:no Physical Aggression / Violence:No  Access to Firearms a concern: No  Gang Involvement:No    Subjective:    Arvil Lauber participated from office located at [facility] with Clinician present. Cali consented to therapy session.    Dellar Fenton presented for her session smiling and in a positive mood. Trenita reports that she feels a level of depression about leaving her job and a level of anxiety about applying and interviewing for a new job. Patient reports a feeling of a lack of direction. Patient continues to express her desire to complete her MSW and reports that her husband is supportive. She also reports that she wants to work in order to supplement the family income. Patient and Clinician continue to process options for working and school such as online classes and financial resources. Patient does reports that her feelings of depression and anxiety related to her previous job  stress has decreased but now she feels pressure to figure out her new direction.   Clinician processed with patient resources for financial assistance and schools offering online classes. Clinician and patient processed her feelings of needing direction and Clinician encouraged patient to give herself some grace and explore all options before making a decision.   Rhiannon continues to make progress in improving her emotional and mental well-being. Armilda reports that she will take at least an hour each day to focus on calls and applications for both jobs and college research. Kniyah was pleasant and cooperative and denied SI and HI. Patient will continue with bi weekly therapy. Treatment plan will be reviewed by 11/14/2023.  Dellar Fenton will continue to focus on enhancing self-awareness, developing a sense of purpose, and setting meaningful goals to achieve greater fulfillment. This may include exploring values, interests, and strengths to create a clearer sense of purpose.  Elaboration: 1. Enhancing Self-Awareness: Identifying Values and Interests: Recognizing what truly matters and what brings joy can help individuals align their actions with their values.  Recognizing Strengths and Talents: Understanding personal strengths and skills can lead to more fulfilling career paths or hobbies.  Exploring Past Experiences: Reflecting on past experiences can reveal patterns and insights that guide future decisions.  Seeking Feedback: Gathering perspectives from others can provide valuable insights into personal strengths and areas for growth.  2. Developing a Sense of Purpose: Setting Life Purpose Statement: Creating a concise statement that encapsulates one's life purpose can provide a roadmap for daily actions.  Connecting to a Larger Cause: Exploring opportunities  to contribute to something meaningful, such as volunteering or pursuing work that aligns with values.  Cultivating Positive Emotions: Practicing  gratitude, awe, and other positive emotions can boost well-being and create a sense of purpose.  3. Setting Meaningful Goals: Identifying Broad Motives and Dreams: Starting with a broad vision of what one wants to achieve in life.  Narrowing Down to Specific Goals: Breaking down broad goals into smaller, more manageable steps.  Using the SMART Framework: Ensuring goals are Specific, Measurable, Achievable, Relevant, and Time-bound.  Creating an Action Plan: Developing a plan to track progress and achieve goals.  Regularly Revisiting and Refining Goals: Adapting goals as needed to ensure they remain relevant and motivating.  4. Exploring Different Approaches: Meditation and Mindfulness: Practicing meditation or mindfulness can help individuals connect with their inner guidance and develop a deeper understanding of themselves.  Dreamwork: Exploring dreams can reveal hidden motivations and insights.  Creative Activities: Engaging in creative pursuits can help individuals express themselves and discover new interests.  Volunteering: Participating in charitable activities can create a sense of purpose and contribute to the community.     Interventions: Cognitive Behavioral Therapy  Diagnosis: Generalized Anxiety Disorder   Keenan Pastor MSW, LCSW/DATE 09/25/2023

## 2023-09-26 ENCOUNTER — Ambulatory Visit: Admitting: Family Medicine

## 2023-09-26 ENCOUNTER — Encounter: Payer: Self-pay | Admitting: Oncology

## 2023-09-26 ENCOUNTER — Other Ambulatory Visit (HOSPITAL_BASED_OUTPATIENT_CLINIC_OR_DEPARTMENT_OTHER): Payer: Self-pay

## 2023-09-27 ENCOUNTER — Other Ambulatory Visit: Payer: Self-pay | Admitting: Internal Medicine

## 2023-09-28 ENCOUNTER — Other Ambulatory Visit: Payer: Self-pay

## 2023-09-28 ENCOUNTER — Other Ambulatory Visit (HOSPITAL_BASED_OUTPATIENT_CLINIC_OR_DEPARTMENT_OTHER): Payer: Self-pay

## 2023-09-28 MED ORDER — AMPHETAMINE-DEXTROAMPHET ER 20 MG PO CP24
20.0000 mg | ORAL_CAPSULE | Freq: Every day | ORAL | 0 refills | Status: DC
  Filled 2023-09-28: qty 30, 30d supply, fill #0

## 2023-10-09 ENCOUNTER — Ambulatory Visit: Admitting: Licensed Clinical Social Worker

## 2023-10-10 ENCOUNTER — Ambulatory Visit (INDEPENDENT_AMBULATORY_CARE_PROVIDER_SITE_OTHER): Admitting: Licensed Clinical Social Worker

## 2023-10-10 DIAGNOSIS — F411 Generalized anxiety disorder: Secondary | ICD-10-CM | POA: Diagnosis not present

## 2023-10-10 NOTE — Progress Notes (Signed)
 [facility] Counselor/Therapist Progress Note  Patient ID: [name], MRN: [id number]    Date: 10/10/23  Time Spent: 0807  am - 0900 am : 53 Minutes  Treatment Type: Individual Therapy.  Reported Symptoms: Patient reports a stressful work environment and reports that she often feels singled out at work. Patient states she has issues with anxiety and depression related to her previous work situation.    Mental Status Exam: Appearance:  Casual     Behavior: Appropriate  Motor: Normal  Speech/Language:  Clear and Coherent  Affect: Appropriate  Mood: anxious  Thought process: normal  Thought content:   WNL  Sensory/Perceptual disturbances:   WNL  Orientation: oriented to person, place, time/date, situation, day of week, month of year, and year  Attention: Good  Concentration: Good  Memory: WNL  Fund of knowledge:  Good  Insight:   Good  Judgment:  Good  Impulse Control: Good    Risk Assessment: Danger to Self:  No Self-injurious Behavior: No Danger to Others: No Duty to Warn:no Physical Aggression / Violence:No  Access to Firearms a concern: No  Gang Involvement:No    Subjective:    Arvil Lauber participated from office located at [facility] with Clinician present. Ashelyn consented to therapy session.    Patient presented for her session apologizing for missing her previous appointment. Patient reports that her Father is visiting and he is 94 years old. She states she enjoys having him but spends her time trying to keep him entertained. She reports that they took him to [facility] and [place] over the weekend and it was a great experience. Tauna states that her Dad is religious now and is a different version of himself than when they were growing up. Patient reports that this time with him allows her to better understand certain situations and to make peace with them. Patient reports that there is still a distance between she and her brother and  concerns that won't change. Reaghan states she isn't sure how long her Dad will be visiting because her brother met her to drop him off and didn't elaborate on a time frame. Alyia states she is also struggling with keeping dates and work recall. She states that she feels it may be due to her being out of work right now but that it's bothersome.   Clinician provided a supportive environment via active listening and verbal feedback. Clinician provided positive feedback concerning patients desire to care for her father and make lasting memories in these last years. Clinician also processed with patient that she has noticed she seems less stressed and patient identified a decrease in her anxiety since she has left her previous job. Clinician processed with patient the use of techniques to improve memory and word recall such as:  Organize Information: Creating outlines or using techniques like chunking (grouping related information into smaller, logical units) can make information easier to remember. Make Associations: Link new information to existing knowledge. Use Visual Cues: Incorporate visuals like concept maps, charts, or images to aid recall, especially if you're a visual learner. Create Mnemonics: Use acronyms, rhymes, or stories to help remember information. Write Things Down: Writing notes by hand engages your brain more actively than typing, leading to better retention and recall. Say It Out Loud: Reading or repeating information aloud can improve memory by engaging both speaking and hearing. Engage in Active Recall: Test yourself regularly to activate your memory and improve recall. This could involve using flashcards or trying to recall  information without looking at your notes. Practice Spaced Repetition: Review information at increasing intervals over time to improve long-term retention. Learn a Foreign Language: This can help keep your mind agile and improve short-term memory. Play Brain  Training Games: Consider using apps like Lumosity, Elevate, or Peak, which offer games and exercises designed to improve memory and cognitive skills. However, the long-term effectiveness of these apps may vary. Use Word Retrieval Strategies: For word-finding difficulties (anomia), practice describing the word, using synonyms or antonyms, thinking of related words, or using gestures.   Jakyah will utilize the suggested skills to improve her memory and word recall. Callia reports a decrease in her anxiety and states she has seen a difference in how she views life since leaving the toxic work environment. Terin will continue with bi weekly therapy sessions. Treatment plan to be reviewed by 11/14/2023.  Cognitive Behavioral Therapy, Motivational Interviewing and psycho education. Clinician conducted session in person at clinician's office at [facility]. Reviewed events since last session. Assessed patient's mood since last session and current mood. Clinician reviewed diagnoses and treatment recommendations. Provided psycho education related to diagnoses and treatment.   Diagnosis: Generalized Anxiety Disorder   Keenan Pastor MSW, LCSW/DATE 10/10/2023

## 2023-10-24 ENCOUNTER — Ambulatory Visit: Admitting: Licensed Clinical Social Worker

## 2023-10-24 NOTE — Progress Notes (Deleted)
 Office Visit Note  Patient: [name]             Date of Birth: 10-31-1970           MRN: [id number]             PCP: Roslyn Coombe, MD Referring: Roslyn Coombe, MD Visit Date: 11/07/2023 Occupation: @GUAROCC @  Subjective:  No chief complaint on file.   History of Present Illness: [name] is a 53 y.o. female ***     Activities of Daily Living:  Patient reports morning stiffness for *** {minute/hour:19697}.   Patient {ACTIONS;DENIES/REPORTS:21021675::Denies} nocturnal pain.  Difficulty dressing/grooming: {ACTIONS;DENIES/REPORTS:21021675::Denies} Difficulty climbing stairs: {ACTIONS;DENIES/REPORTS:21021675::Denies} Difficulty getting out of chair: {ACTIONS;DENIES/REPORTS:21021675::Denies} Difficulty using hands for taps, buttons, cutlery, and/or writing: {ACTIONS;DENIES/REPORTS:21021675::Denies}  No Rheumatology ROS completed.   PMFS History:  Patient Active Problem List   Diagnosis Date Noted   Carpal tunnel syndrome on right 10/12/2022   Ganglion cyst of flexor tendon sheath of finger of right hand 10/12/2022   Primary osteoarthritis of first carpometacarpal joint of right hand 10/12/2022   Adjustment disorder with mixed anxiety and depressed mood 09/12/2022   Carpal tunnel syndrome on left 09/07/2022   Eating disorder 08/11/2022   Blurry vision, bilateral 06/28/2022   Primary osteoarthritis of first carpometacarpal joint of left hand 06/28/2022   Hoarseness 06/18/2022   Insulin  resistance 05/18/2022   Major depressive disorder 03/09/2022   Polyphagia 03/09/2022   Left foot pain 02/16/2022   Osteoarthritis of both knees 01/13/2022   Hot flashes due to menopause 12/19/2021   ADD (attention deficit disorder) 12/19/2021   SOBOE (shortness of breath on exertion) 11/16/2021   Elevated glucose 11/16/2021   Vitamin D  deficiency 11/16/2021   Prediabetes 11/16/2021   Bunion, left 08/06/2021   Morbid obesity (HCC) with initial BMI 52 06/26/2021    Venous insufficiency 06/25/2021   Incisional hernia 12/25/2020   Mediastinal lymphadenopathy    Pulmonary nodules 10/20/2020   Multiple skin nodules 07/24/2020   Right lumbar radiculopathy 05/18/2020   Periumbilical hernia 05/18/2020   TB lung, latent 05/18/2020   Acute non-recurrent maxillary sinusitis 11/09/2019   HTN (hypertension) 04/14/2019   Mass of left wrist 04/14/2019   External otitis of right ear 04/14/2019   Choledocholithiasis    Abnormal cholangiogram    Cholecystitis with cholelithiasis 07/24/2018   Hot flash not due to menopause 10/16/2017   Eczema 10/16/2017   Flu-like symptoms 06/20/2017   Cognitive changes 01/30/2017   Disturbed concentration 01/26/2017   Left shoulder pain 11/01/2016   Right low back pain 11/01/2016   Elevated blood pressure, situational 05/05/2016   Surgical menopause 01/29/2016   OSA on CPAP 12/22/2015   Cough 11/26/2015   Capsulitis 10/06/2015   Ganglion cyst of left foot 10/06/2015   Fatigue 09/13/2015   Asthma 09/08/2015   Exertional dyspnea 09/08/2015   Peroneal ganglion cyst 07/20/2015   Low back pain 07/20/2015   Peroneal tendinitis of left lower leg 06/25/2015   Nonallopathic lesion of cervical region 06/25/2015   Wheezing 05/08/2015   Polyarthralgia 05/08/2015   Peripheral edema 03/16/2015   Central line complication 03/16/2015   Chemotherapy induced neutropenia (HCC) 02/28/2015   Screening for colorectal cancer 01/05/2015   Chemotherapy induced nausea and vomiting 01/02/2015   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   Premature surgical menopause 12/17/2014   Leukopenia due to antineoplastic chemotherapy (HCC) 12/17/2014   Port-A-Cath in place 12/17/2014   Encounter for antineoplastic chemotherapy 12/17/2014   Myalgia 12/09/2014   Hypersensitivity reaction 12/05/2014  Poor venous access 11/28/2014   Postoperative cellulitis of surgical wound 11/24/2014   Malignant neoplasm of both ovaries  11/10/2014   Anxiety 11/10/2014   Adnexal mass 11/10/2014   Encounter for well adult exam with abnormal findings 09/10/2014   Hypothyroidism 09/10/2014   Hypersomnolence 09/10/2014   Acute non-recurrent frontal sinusitis 06/24/2014   Right knee pain 06/24/2014   Lower back pain 01/08/2014   Otalgia 12/18/2009   KNEE PAIN, RIGHT 12/18/2009   INGROWN TOENAIL 12/17/2008   Backache 12/17/2008   Acute bronchospasm 11/11/2008   Depression 11/29/2007   Insomnia 11/29/2007   Generalized anxiety disorder 04/13/2007   Allergic rhinitis 04/13/2007   GERD 04/13/2007   Endometriosis determined by laparoscopy 04/13/2007    Past Medical History:  Diagnosis Date   Anxiety    Arthritis    Asthma    Back pain    Barrett's esophagus    Chemotherapy-induced neuropathy (HCC)    Chronic lower back pain    Depression    Dyspnea    Elevated blood pressure, situational 05/05/2016   Endometriosis    Exercise-induced asthma 09/08/2015   Fatigue    Gallbladder problem    GERD (gastroesophageal reflux disease)    Heartburn    History of kidney stones    Hypertension    Hypothyroidism    Infertility, female    Joint pain    Joint stiffness    Lower extremity edema    Migraine    ovarian cancer    Ovarian cancer, bilateral (HCC) 11/28/2014   PONV (postoperative nausea and vomiting)    Sarcoidosis    Sleep apnea    SOBOE (shortness of breath on exertion)    Thyroid  disease    Umbilical hernia    Vitamin D  deficiency    woke up while porta cath removed     Family History  Problem Relation Age of Onset   Hyperlipidemia Mother    Hypertension Mother    Diabetes Mother    Stroke Mother        Deceased   Heart disease Mother    Osteoarthritis Mother    Atrial fibrillation Father        Living   Stroke Father        optic nerve left eye   Healthy Brother    Diabetes Maternal Aunt    Heart failure Maternal Grandmother    Heart attack Maternal Grandfather    Dementia Paternal  Grandmother    Dementia Paternal Grandfather    Past Surgical History:  Procedure Laterality Date   ABDOMINAL HYSTERECTOMY  11/10/2014   at [facility], Exp lap, supracervical hyst, BSO, infracolic omentectomy, lymphadenectomy, aortic lymph node sampling   BIOPSY  11/04/2021   Procedure: BIOPSY;  Surgeon: Lajuan Pila, MD;  Location: [facility];  Service: Gastroenterology;;   BRONCHIAL NEEDLE ASPIRATION BIOPSY  12/16/2020   Procedure: BRONCHIAL NEEDLE ASPIRATION BIOPSIES;  Surgeon: Lind Repine, MD;  Location[facility];  Service: Cardiopulmonary;;   BRONCHIAL WASHINGS  12/16/2020   Procedure: BRONCHIAL WASHINGS;  Surgeon: Lind Repine, MD;  Location: [facility] ENDOSCOPY;  Service: Cardiopulmonary;;   BUNIONECTOMY Left 09/14/2021   CARPAL TUNNEL RELEASE Left 09/07/2022   Procedure: LEFT CARPAL TUNNEL RELEASE, LEFT THUMB CARPOMETACARPAL INJECTION;  Surgeon: Wes Hamman, MD;  Location: [facility];  Service: Orthopedics;  Laterality: Left;   CARPAL TUNNEL RELEASE Right 10/12/2022   Procedure: RIGHT CARPAL TUNNEL RELEASE, RIGHT THUMB CARPOMETACARPAL INJECTION;  Surgeon: Wes Hamman, MD;  Location: [facility];  Service: Orthopedics;  Laterality: Right;   CHOLECYSTECTOMY N/A 07/24/2018   Procedure: LAPAROSCOPIC CHOLECYSTECTOMY WITH INTRAOPERATIVE CHOLANGIOGRAM;  Surgeon: Oralee Billow, MD;  [facility]: [facility];  Service: General;  Laterality: N/A;   COLONOSCOPY WITH PROPOFOL  N/A 11/04/2021   Procedure: COLONOSCOPY WITH PROPOFOL ;  Surgeon: Lajuan Pila, MD;  Location[facility];  Service: Gastroenterology;  Laterality: N/A;   ERCP N/A 07/26/2018   Procedure: ENDOSCOPIC RETROGRADE CHOLANGIOPANCREATOGRAPHY (ERCP);  Surgeon: Asencion Blacksmith, MD;  Location: [facility];  Service: Endoscopy;  Laterality: N/A;   ESOPHAGOGASTRODUODENOSCOPY (EGD) WITH PROPOFOL  N/A 11/04/2021   Procedure: ESOPHAGOGASTRODUODENOSCOPY (EGD) WITH PROPOFOL ;  Surgeon: Lajuan Pila, MD;  [facility];  Service: Gastroenterology;  Laterality: N/A;   GANGLION CYST EXCISION Right    hand   HYSTERECTOMY ABDOMINAL WITH SALPINGO-OOPHORECTOMY  11/10/2014   ovarian cancer, tumor removal   INCISIONAL HERNIA REPAIR N/A 12/25/2020   Procedure: LAPAROSCOPIC INCISIONAL HERNIA REPAIR WITH MESH;  Surgeon: Kinsinger, Alphonso Aschoff, MD;  Location: [facility];  Service: General;  Laterality: N/A;   ingrown nail removal     IR GENERIC HISTORICAL  05/12/2016   IR REMOVAL TUN ACCESS W/ PORT W/O FL MOD SED 05/12/2016 [facility]   LAPAROSCOPIC ENDOMETRIOSIS FULGURATION     LIPOMA EXCISION Left    ankle   meniscal tear Right    MENISCUS REPAIR Right    oral sugery     POLYPECTOMY  11/04/2021   Procedure: POLYPECTOMY;  Surgeon: Lajuan Pila, MD;  Location: [facility];  Service: Gastroenterology;;   [place] Stade a cath removal     PORTA CATH INSERTION     REMOVAL OF STONES  07/26/2018   Procedure: REMOVAL OF STONES;  Surgeon: Asencion Blacksmith, MD;  Location: [facility];  Service: Endoscopy;;   SPHINCTEROTOMY  07/26/2018   Procedure: [address];  Surgeon: Asencion Blacksmith, MD;  [facility];  Service: Endoscopy;;   UMBILICAL HERNIA REPAIR  12/29/2020   VIDEO BRONCHOSCOPY WITH ENDOBRONCHIAL ULTRASOUND N/A 12/16/2020   Procedure: VIDEO BRONCHOSCOPY WITH ENDOBRONCHIAL ULTRASOUND;  Surgeon: Lind Repine, MD;  Location: [facility];  Service: Cardiopulmonary;  Laterality: N/A;   Social History   Social History Narrative   Lives with boyfriend in a one story home.  Has no children.     Works in Marine scientist at [employer].     Education: college.   Right handed    Drinks caffeine   Immunization History  Administered Date(s) Administered   Influenza Inj Mdck Quad Pf 02/09/2017   Influenza,inj,Quad PF,6+ Mos 01/02/2014, 03/26/2015, 12/25/2017, 01/21/2020, 01/25/2021   Influenza-Unspecified 02/23/2016, 02/09/2017, 02/12/2020, 02/14/2022, 02/20/2023   MMR 02/09/2017, 07/03/2017   PFIZER(Purple  Top)SARS-COV-2 Vaccination 08/01/2019, 08/22/2019, 02/22/2020   PNEUMOCOCCAL CONJUGATE-20 06/19/2023   Tdap 11/07/2017   Tetanus 12/21/2015   Zoster Recombinant(Shingrix ) 06/22/2021, 11/26/2021     Objective: Vital Signs: LMP 11/02/2014    Physical Exam   Musculoskeletal Exam: ***  CDAI Exam: CDAI Score: -- Patient Global: --; Provider Global: -- Swollen: --; Tender: -- Joint Exam 11/07/2023   No joint exam has been documented for this visit   There is currently no information documented on the homunculus. Go to the Rheumatology activity and complete the homunculus joint exam.  Investigation: No additional findings.  Imaging: No results found.  Recent Labs: Lab Results  Component Value Date   WBC 11.0 (H) 06/13/2023   HGB 14.2 06/13/2023   PLT 305 06/13/2023   NA 137 06/13/2023   K 3.8 06/13/2023   CL  103 06/13/2023   CO2 26 06/13/2023   GLUCOSE 88 06/13/2023   BUN 13 06/13/2023   CREATININE 0.75 06/13/2023   BILITOT 0.7 05/12/2023   ALKPHOS 68 11/16/2021   AST 14 05/12/2023   ALT 15 05/12/2023   PROT 6.9 05/12/2023   PROT 7.0 05/12/2023   ALBUMIN 4.5 11/16/2021   CALCIUM 9.1 06/13/2023   GFRAA 110 11/11/2020   QFTBGOLDPLUS NEGATIVE 11/11/2020    Speciality Comments: No specialty comments available.  Procedures:  No procedures performed Allergies: Moxifloxacin, Quinolones, Sulfonamide derivatives, Doxycycline, Escitalopram  oxalate, and Lisinopril    Assessment / Plan:     Visit Diagnoses: Sarcoidosis  Positive ANA (antinuclear antibody)  High risk medication use  Sialolithiasis  Primary osteoarthritis of both hands  Bilateral carpal tunnel syndrome  S/P bunionectomy  Primary osteoarthritis of both knees  Right lumbar radiculopathy  Other fatigue  Malignant neoplasm of both ovaries (HCC)  Chemotherapy-induced peripheral neuropathy (HCC)  Primary hypertension  Acquired hypothyroidism  Migraine without aura and with status  migrainosus, not intractable  Mild intermittent asthma with acute exacerbation  OSA on CPAP  History of gastroesophageal reflux (GERD)  Surgical menopause  Multiple lung nodules  TB lung, latent  Orders: No orders of the defined types were placed in this encounter.  No orders of the defined types were placed in this encounter.   Face-to-face time spent with patient was *** minutes. Greater than 50% of time was spent in counseling and coordination of care.  Follow-Up Instructions: No follow-ups on file.   Romayne Clubs, PA-C  Note - This record has been created using Dragon software.  Chart creation errors have been sought, but may not always  have been located. Such creation errors do not reflect on  the standard of medical care.

## 2023-10-25 ENCOUNTER — Other Ambulatory Visit: Payer: Self-pay

## 2023-10-25 ENCOUNTER — Other Ambulatory Visit (HOSPITAL_BASED_OUTPATIENT_CLINIC_OR_DEPARTMENT_OTHER): Payer: Self-pay

## 2023-11-01 ENCOUNTER — Ambulatory Visit: Payer: Commercial Managed Care - PPO | Admitting: Rheumatology

## 2023-11-07 ENCOUNTER — Ambulatory Visit: Payer: Commercial Managed Care - PPO | Admitting: Rheumatology

## 2023-11-07 DIAGNOSIS — Z8719 Personal history of other diseases of the digestive system: Secondary | ICD-10-CM

## 2023-11-07 DIAGNOSIS — D869 Sarcoidosis, unspecified: Secondary | ICD-10-CM

## 2023-11-07 DIAGNOSIS — M5416 Radiculopathy, lumbar region: Secondary | ICD-10-CM

## 2023-11-07 DIAGNOSIS — C563 Malignant neoplasm of bilateral ovaries: Secondary | ICD-10-CM

## 2023-11-07 DIAGNOSIS — R918 Other nonspecific abnormal finding of lung field: Secondary | ICD-10-CM

## 2023-11-07 DIAGNOSIS — G5603 Carpal tunnel syndrome, bilateral upper limbs: Secondary | ICD-10-CM

## 2023-11-07 DIAGNOSIS — E8940 Asymptomatic postprocedural ovarian failure: Secondary | ICD-10-CM

## 2023-11-07 DIAGNOSIS — J4521 Mild intermittent asthma with (acute) exacerbation: Secondary | ICD-10-CM

## 2023-11-07 DIAGNOSIS — G620 Drug-induced polyneuropathy: Secondary | ICD-10-CM

## 2023-11-07 DIAGNOSIS — Z9889 Other specified postprocedural states: Secondary | ICD-10-CM

## 2023-11-07 DIAGNOSIS — Z227 Latent tuberculosis: Secondary | ICD-10-CM

## 2023-11-07 DIAGNOSIS — I10 Essential (primary) hypertension: Secondary | ICD-10-CM

## 2023-11-07 DIAGNOSIS — R5383 Other fatigue: Secondary | ICD-10-CM

## 2023-11-07 DIAGNOSIS — R768 Other specified abnormal immunological findings in serum: Secondary | ICD-10-CM

## 2023-11-07 DIAGNOSIS — G4733 Obstructive sleep apnea (adult) (pediatric): Secondary | ICD-10-CM

## 2023-11-07 DIAGNOSIS — E039 Hypothyroidism, unspecified: Secondary | ICD-10-CM

## 2023-11-07 DIAGNOSIS — K115 Sialolithiasis: Secondary | ICD-10-CM

## 2023-11-07 DIAGNOSIS — G43001 Migraine without aura, not intractable, with status migrainosus: Secondary | ICD-10-CM

## 2023-11-07 DIAGNOSIS — M170 Bilateral primary osteoarthritis of knee: Secondary | ICD-10-CM

## 2023-11-07 DIAGNOSIS — M19041 Primary osteoarthritis, right hand: Secondary | ICD-10-CM

## 2023-11-07 DIAGNOSIS — Z79899 Other long term (current) drug therapy: Secondary | ICD-10-CM

## 2023-11-09 ENCOUNTER — Other Ambulatory Visit: Payer: Self-pay | Admitting: Internal Medicine

## 2023-11-09 ENCOUNTER — Other Ambulatory Visit: Payer: Self-pay

## 2023-11-09 ENCOUNTER — Other Ambulatory Visit (HOSPITAL_BASED_OUTPATIENT_CLINIC_OR_DEPARTMENT_OTHER): Payer: Self-pay

## 2023-11-09 MED ORDER — AMPHETAMINE-DEXTROAMPHET ER 20 MG PO CP24
20.0000 mg | ORAL_CAPSULE | Freq: Every day | ORAL | 0 refills | Status: DC
  Filled 2023-11-09: qty 30, 30d supply, fill #0

## 2023-11-15 ENCOUNTER — Encounter: Payer: Self-pay | Admitting: Oncology

## 2023-11-20 ENCOUNTER — Encounter: Payer: Self-pay | Admitting: Neurology

## 2023-11-20 ENCOUNTER — Ambulatory Visit: Payer: Commercial Managed Care - PPO | Admitting: Neurology

## 2023-11-22 ENCOUNTER — Ambulatory Visit: Payer: Commercial Managed Care - PPO | Admitting: Dermatology

## 2023-11-29 ENCOUNTER — Ambulatory Visit (HOSPITAL_COMMUNITY)
Admission: RE | Admit: 2023-11-29 | Discharge: 2023-11-29 | Disposition: A | Discharge status: Home / Self Care | Payer: Self-pay | Source: Ambulatory Visit | Attending: Internal Medicine | Admitting: Internal Medicine

## 2023-11-29 DIAGNOSIS — R739 Hyperglycemia, unspecified: Secondary | ICD-10-CM | POA: Insufficient documentation

## 2023-11-29 DIAGNOSIS — I10 Essential (primary) hypertension: Secondary | ICD-10-CM | POA: Insufficient documentation

## 2023-11-29 DIAGNOSIS — R7303 Prediabetes: Secondary | ICD-10-CM | POA: Insufficient documentation

## 2023-11-29 DIAGNOSIS — R9431 Abnormal electrocardiogram [ECG] [EKG]: Secondary | ICD-10-CM | POA: Insufficient documentation

## 2023-11-30 ENCOUNTER — Ambulatory Visit: Payer: Self-pay | Admitting: Internal Medicine

## 2023-12-02 ENCOUNTER — Other Ambulatory Visit (HOSPITAL_BASED_OUTPATIENT_CLINIC_OR_DEPARTMENT_OTHER): Payer: Self-pay

## 2023-12-14 ENCOUNTER — Other Ambulatory Visit (HOSPITAL_BASED_OUTPATIENT_CLINIC_OR_DEPARTMENT_OTHER): Payer: Self-pay

## 2023-12-14 ENCOUNTER — Other Ambulatory Visit: Payer: Self-pay | Admitting: Internal Medicine

## 2023-12-14 MED ORDER — AMPHETAMINE-DEXTROAMPHET ER 20 MG PO CP24
20.0000 mg | ORAL_CAPSULE | Freq: Every day | ORAL | 0 refills | Status: DC
  Filled 2023-12-14: qty 30, 30d supply, fill #0
  Filled ????-??-??: fill #0

## 2023-12-18 ENCOUNTER — Ambulatory Visit: Payer: Commercial Managed Care - PPO | Admitting: Internal Medicine

## 2024-01-09 ENCOUNTER — Other Ambulatory Visit: Payer: Self-pay

## 2024-01-09 ENCOUNTER — Other Ambulatory Visit: Payer: Self-pay | Admitting: Surgical

## 2024-01-09 ENCOUNTER — Encounter: Payer: Self-pay | Admitting: Oncology

## 2024-01-09 ENCOUNTER — Other Ambulatory Visit (HOSPITAL_BASED_OUTPATIENT_CLINIC_OR_DEPARTMENT_OTHER): Payer: Self-pay

## 2024-01-09 MED ORDER — MELOXICAM 7.5 MG PO TABS
7.5000 mg | ORAL_TABLET | Freq: Two times a day (BID) | ORAL | 2 refills | Status: AC | PRN
  Filled 2024-01-09: qty 30, 15d supply, fill #0
  Filled 2024-04-11: qty 30, 15d supply, fill #1
  Filled 2024-05-06: qty 30, 15d supply, fill #2

## 2024-01-15 ENCOUNTER — Other Ambulatory Visit: Payer: Self-pay

## 2024-01-15 ENCOUNTER — Other Ambulatory Visit: Payer: Self-pay | Admitting: Internal Medicine

## 2024-01-15 ENCOUNTER — Other Ambulatory Visit (HOSPITAL_BASED_OUTPATIENT_CLINIC_OR_DEPARTMENT_OTHER): Payer: Self-pay

## 2024-01-15 MED ORDER — AMPHETAMINE-DEXTROAMPHET ER 20 MG PO CP24
20.0000 mg | ORAL_CAPSULE | Freq: Every day | ORAL | 0 refills | Status: DC
  Filled 2024-01-15: qty 30, 30d supply, fill #0

## 2024-01-19 ENCOUNTER — Ambulatory Visit: Admitting: Physician Assistant

## 2024-01-19 ENCOUNTER — Encounter: Payer: Self-pay | Admitting: Physician Assistant

## 2024-01-19 DIAGNOSIS — M65312 Trigger thumb, left thumb: Secondary | ICD-10-CM

## 2024-01-19 MED ORDER — LIDOCAINE HCL 1 % IJ SOLN
0.3333 mL | INTRAMUSCULAR | Status: AC | PRN
  Administered 2024-01-19: .3333 mL

## 2024-01-19 MED ORDER — BUPIVACAINE HCL 0.25 % IJ SOLN
0.3333 mL | INTRAMUSCULAR | Status: AC | PRN
  Administered 2024-01-19: .3333 mL

## 2024-01-19 MED ORDER — METHYLPREDNISOLONE ACETATE 40 MG/ML IJ SUSP
0.3333 mg | INTRAMUSCULAR | Status: AC | PRN
  Administered 2024-01-19: .3333 mg

## 2024-01-19 NOTE — Progress Notes (Signed)
 Office Visit Note   Patient: [name]           Date of Birth: 08/23/1970           MRN: [id number] Visit Date: 01/19/2024              Requested by: Norleen Lynwood ORN, MD [address] [place],  [place] [address] PCP: Norleen Lynwood ORN, MD   Assessment & Plan: Visit Diagnoses:  1. Trigger finger of left thumb     Plan: Impression is left trigger thumb.  Today, we discussed various treatment options to include trigger finger injection and night splinting for which she is agreeable to.  She will follow-up with us  as needed.  Call with concerns or questions.  Follow-Up Instructions: Return if symptoms worsen or fail to improve.   Orders:  Orders Placed This Encounter  Procedures   Hand/UE Inj   No orders of the defined types were placed in this encounter.     Procedures: Hand/UE Inj: L thumb A1 for trigger finger on 01/19/2024 10:10 AM Medications: 0.3333 mL lidocaine  1 %; 0.3333 mL bupivacaine  0.25 %; 0.3333 mg methylPREDNISolone  acetate 40 MG/ML      Clinical Data: No additional findings.   Subjective: Chief Complaint  Patient presents with   Left Hand - Pain    thumb    HPI patient is a pleasant 53 year old female who comes in today with pain and triggering of the left thumb.  This began about 3 to 4 weeks ago.  She notices her symptoms most at night.  She has tried a splint which does not provide much support.  No previous cortisone injection.  She is not a diabetic.  No previous trigger finger.  Review of Systems as detailed in HPI.  All others reviewed and are negative.   Objective: Vital Signs: LMP 11/02/2014   Physical Exam well-developed well-nourished female in no acute distress.  Alert and oriented x 3.  Ortho Exam left thumb exam: Moderate tenderness to palpable nodule at the A1 pulley.  She does have reproducible triggering.  She is neurovascularly intact distally.  Specialty Comments:  No specialty comments available.  Imaging: No new  imaging   PMFS History: Patient Active Problem List   Diagnosis Date Noted   Carpal tunnel syndrome on right 10/12/2022   Ganglion cyst of flexor tendon sheath of finger of right hand 10/12/2022   Primary osteoarthritis of first carpometacarpal joint of right hand 10/12/2022   Adjustment disorder with mixed anxiety and depressed mood 09/12/2022   Carpal tunnel syndrome on left 09/07/2022   Eating disorder 08/11/2022   Blurry vision, bilateral 06/28/2022   Primary osteoarthritis of first carpometacarpal joint of left hand 06/28/2022   Hoarseness 06/18/2022   Insulin  resistance 05/18/2022   Major depressive disorder 03/09/2022   Polyphagia 03/09/2022   Left foot pain 02/16/2022   Osteoarthritis of both knees 01/13/2022   Hot flashes due to menopause 12/19/2021   ADD (attention deficit disorder) 12/19/2021   SOBOE (shortness of breath on exertion) 11/16/2021   Elevated glucose 11/16/2021   Vitamin D  deficiency 11/16/2021   Prediabetes 11/16/2021   Bunion, left 08/06/2021   Morbid obesity (HCC) with initial BMI 52 06/26/2021   Venous insufficiency 06/25/2021   Incisional hernia 12/25/2020   Mediastinal lymphadenopathy    Pulmonary nodules 10/20/2020   Multiple skin nodules 07/24/2020   Right lumbar radiculopathy 05/18/2020   Periumbilical hernia 05/18/2020   TB lung, latent 05/18/2020   Acute non-recurrent maxillary  sinusitis 11/09/2019   HTN (hypertension) 04/14/2019   Mass of left wrist 04/14/2019   External otitis of right ear 04/14/2019   Choledocholithiasis    Abnormal cholangiogram    Cholecystitis with cholelithiasis 07/24/2018   Hot flash not due to menopause 10/16/2017   Eczema 10/16/2017   Flu-like symptoms 06/20/2017   Cognitive changes 01/30/2017   Disturbed concentration 01/26/2017   Left shoulder pain 11/01/2016   Right low back pain 11/01/2016   Elevated blood pressure, situational 05/05/2016   Surgical menopause 01/29/2016   OSA on CPAP 12/22/2015    Cough 11/26/2015   Capsulitis 10/06/2015   Ganglion cyst of left foot 10/06/2015   Fatigue 09/13/2015   Asthma 09/08/2015   Exertional dyspnea 09/08/2015   Peroneal ganglion cyst 07/20/2015   Low back pain 07/20/2015   Peroneal tendinitis of left lower leg 06/25/2015   Nonallopathic lesion of cervical region 06/25/2015   Wheezing 05/08/2015   Polyarthralgia 05/08/2015   Peripheral edema 03/16/2015   Central line complication 03/16/2015   Chemotherapy induced neutropenia (HCC) 02/28/2015   Screening for colorectal cancer 01/05/2015   Chemotherapy induced nausea and vomiting 01/02/2015   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   Premature surgical menopause 12/17/2014   Leukopenia due to antineoplastic chemotherapy (HCC) 12/17/2014   Port-A-Cath in place 12/17/2014   Encounter for antineoplastic chemotherapy 12/17/2014   Myalgia 12/09/2014   Hypersensitivity reaction 12/05/2014   Poor venous access 11/28/2014   Postoperative cellulitis of surgical wound 11/24/2014   Malignant neoplasm of both ovaries 11/10/2014   Anxiety 11/10/2014   Adnexal mass 11/10/2014   Encounter for well adult exam with abnormal findings 09/10/2014   Hypothyroidism 09/10/2014   Hypersomnolence 09/10/2014   Acute non-recurrent frontal sinusitis 06/24/2014   Right knee pain 06/24/2014   Lower back pain 01/08/2014   Otalgia 12/18/2009   KNEE PAIN, RIGHT 12/18/2009   INGROWN TOENAIL 12/17/2008   Backache 12/17/2008   Acute bronchospasm 11/11/2008   Depression 11/29/2007   Insomnia 11/29/2007   Generalized anxiety disorder 04/13/2007   Allergic rhinitis 04/13/2007   GERD 04/13/2007   Endometriosis determined by laparoscopy 04/13/2007   Past Medical History:  Diagnosis Date   Anxiety    Arthritis    Asthma    Back pain    Barrett's esophagus    Chemotherapy-induced neuropathy (HCC)    Chronic lower back pain    Depression    Dyspnea    Elevated blood pressure, situational  05/05/2016   Endometriosis    Exercise-induced asthma 09/08/2015   Fatigue    Gallbladder problem    GERD (gastroesophageal reflux disease)    Heartburn    History of kidney stones    Hypertension    Hypothyroidism    Infertility, female    Joint pain    Joint stiffness    Lower extremity edema    Migraine    ovarian cancer    Ovarian cancer, bilateral (HCC) 11/28/2014   PONV (postoperative nausea and vomiting)    Sarcoidosis    Sleep apnea    SOBOE (shortness of breath on exertion)    Thyroid  disease    Umbilical hernia    Vitamin D  deficiency    woke up while porta cath removed     Family History  Problem Relation Age of Onset   Hyperlipidemia Mother    Hypertension Mother    Diabetes Mother    Stroke Mother        Deceased   Heart disease Mother  Osteoarthritis Mother    Atrial fibrillation Father        Living   Stroke Father        optic nerve left eye   Healthy Brother    Diabetes Maternal Aunt    Heart failure Maternal Grandmother    Heart attack Maternal Grandfather    Dementia Paternal Grandmother    Dementia Paternal Grandfather     Past Surgical History:  Procedure Laterality Date   ABDOMINAL HYSTERECTOMY  11/10/2014   at [facility], Exp lap, supracervical hyst, BSO, infracolic omentectomy, lymphadenectomy, aortic lymph node sampling   BIOPSY  11/04/2021   Procedure: BIOPSY;  Surgeon: Charlanne Groom, MD;  Location: [facility];  Service: Gastroenterology;;   BRONCHIAL NEEDLE ASPIRATION BIOPSY  12/16/2020   Procedure: BRONCHIAL NEEDLE ASPIRATION BIOPSIES;  Surgeon: Jude Harden GAILS, MD;  Location: [facility] END[facility];  Service: Cardiopulmonary;;   BRONCHIAL WASHINGS  12/16/2020   Procedure: BRONCHIAL WASHINGS;  Surgeon: Jude Harden GAILS, MD;  Location: [facility]COPY;  Service: Cardiopulmonary;;   BUNIONECTOMY Left 09/14/2021   CARPAL TUNNEL RELEASE Left 09/07/2022   Procedure: LEFT CARPAL TUNNEL RELEASE, LEFT THUMB CARPOMETACARPAL INJECTION;  Surgeon: Jerri Kay HERO,  MD;  Location: [facility];  Service: Orthopedics;  Laterality: Left;   CARPAL TUNNEL RELEASE Right 10/12/2022   Procedure: RIGHT CARPAL TUNNEL RELEASE, RIGHT THUMB CARPOMETACARPAL INJECTION;  Surgeon: Jerri Kay HERO, MD;  Location: [facility];  Service: Orthopedics;  Laterality: Right;   CHOLECYSTECTOMY N/A 07/24/2018   Procedure: LAPAROSCOPIC CHOLECYSTECTOMY WITH INTRAOPERATIVE CHOLANGIOGRAM;  Surgeon: Eletha Boas, MD;  Location[facility];  Service: General;  Laterality: N/A;   COLONOSCOPY WITH PROPOFOL  N/A 11/04/2021   Procedure: COLONOSCOPY WITH PROPOFOL ;  Surgeon: Charlanne Groom, MD;  Location[facility];  Service: Gastroenterology;  Laterality: N/A;   ERCP N/A 07/26/2018   Procedure: ENDOSCOPIC RETROGRADE CHOLANGIOPANCREATOGRAPHY (ERCP);  Surgeon: Aneita Gwendlyn DASEN, MD;  Location: [facility];  Service: Endoscopy;  Laterality: N/A;   ESOPHAGOGASTRODUODENOSCOPY (EGD) WITH PROPOFOL  N/A 11/04/2021   Procedure: ESOPHAGOGASTRODUODENOSCOPY (EGD) WITH PROPOFOL ;  Surgeon: Charlanne Groom, MD;  Location[facility];  Service: Gastroenterology;  Laterality: N/A;   GANGLION CYST EXCISION Right    hand   HYSTERECTOMY ABDOMINAL WITH SALPINGO-OOPHORECTOMY  11/10/2014   ovarian cancer, tumor removal   INCISIONAL HERNIA REPAIR N/A 12/25/2020   Procedure: LAPAROSCOPIC INCISIONAL HERNIA REPAIR WITH MESH;  Surgeon: Kinsinger, Herlene Righter, MD;  Location: [facility];  Service: General;  Laterality: N/A;   ingrown nail removal     IR GENERIC HISTORICAL  05/12/2016   IR REMOVAL TUN ACCESS W/ PORT W/O FL MOD SED 05/12/2016 [facility]   LAPAROSCOPIC ENDOMETRIOSIS FULGURATION     LIPOMA EXCISION Left    ankle   meniscal tear Right    MENISCUS REPAIR Right    oral sugery     POLYPECTOMY  11/04/2021   Procedure: POLYPECTOMY;  Surgeon: Charlanne Groom, MD;  Location: [facility];  Service: Gastroenterology;;   Pat a cath removal     PORTA CATH INSERTION     REMOVAL OF STONES   07/26/2018   Procedure: REMOVAL OF STONES;  Surgeon: Aneita Gwendlyn DASEN, MD;  Location: [facility];  Service: Endoscopy;;   SPHINCTEROTOMY  07/26/2018   Procedure: [place]T;  Surgeon: Aneita Gwendlyn DASEN, MD;  Location: [facility];  Service: Endoscopy;;   UMBILICAL HERNIA REPAIR  12/29/2020   VIDEO BRONCHOSCOPY WITH ENDOBRONCHIAL ULTRASOUND N/A 12/16/2020   Procedure: VIDEO BRONCHOSCOPY WITH ENDOBRONCHIAL ULTRASOUND;  Surgeon: Jude Harden GAILS, MD;  Location: [facility];  Service: Cardiopulmonary;  Laterality: N/A;   Social History   Occupational History   Occupation: Educational psychologist: [employer]  Tobacco Use   Smoking status: Never    Passive exposure: Past   Smokeless tobacco: Never  Vaping Use   Vaping status: Never Used  Substance and Sexual Activity   Alcohol  use: Not Currently   Drug use: No   Sexual activity: Not on file

## 2024-01-31 ENCOUNTER — Other Ambulatory Visit: Payer: Self-pay

## 2024-01-31 ENCOUNTER — Other Ambulatory Visit: Payer: Self-pay | Admitting: Neurology

## 2024-02-05 ENCOUNTER — Other Ambulatory Visit (HOSPITAL_BASED_OUTPATIENT_CLINIC_OR_DEPARTMENT_OTHER): Payer: Self-pay

## 2024-02-06 ENCOUNTER — Other Ambulatory Visit: Payer: Self-pay | Admitting: Neurology

## 2024-02-20 ENCOUNTER — Other Ambulatory Visit (HOSPITAL_BASED_OUTPATIENT_CLINIC_OR_DEPARTMENT_OTHER): Payer: Self-pay

## 2024-02-20 ENCOUNTER — Other Ambulatory Visit: Payer: Self-pay | Admitting: Neurology

## 2024-02-20 ENCOUNTER — Encounter (HOSPITAL_BASED_OUTPATIENT_CLINIC_OR_DEPARTMENT_OTHER): Payer: Self-pay

## 2024-02-21 ENCOUNTER — Other Ambulatory Visit (HOSPITAL_BASED_OUTPATIENT_CLINIC_OR_DEPARTMENT_OTHER): Payer: Self-pay

## 2024-02-22 ENCOUNTER — Encounter (HOSPITAL_BASED_OUTPATIENT_CLINIC_OR_DEPARTMENT_OTHER): Payer: Self-pay

## 2024-02-22 ENCOUNTER — Other Ambulatory Visit (HOSPITAL_BASED_OUTPATIENT_CLINIC_OR_DEPARTMENT_OTHER): Payer: Self-pay

## 2024-02-28 ENCOUNTER — Other Ambulatory Visit (HOSPITAL_BASED_OUTPATIENT_CLINIC_OR_DEPARTMENT_OTHER): Payer: Self-pay

## 2024-02-28 ENCOUNTER — Encounter (HOSPITAL_COMMUNITY): Payer: Self-pay | Admitting: Pharmacist

## 2024-03-05 ENCOUNTER — Other Ambulatory Visit: Payer: Self-pay | Admitting: Internal Medicine

## 2024-03-05 ENCOUNTER — Other Ambulatory Visit: Payer: Self-pay

## 2024-03-06 ENCOUNTER — Other Ambulatory Visit (HOSPITAL_BASED_OUTPATIENT_CLINIC_OR_DEPARTMENT_OTHER): Payer: Self-pay

## 2024-03-06 MED ORDER — AMPHETAMINE-DEXTROAMPHET ER 20 MG PO CP24
20.0000 mg | ORAL_CAPSULE | Freq: Every day | ORAL | 0 refills | Status: DC
  Filled 2024-03-06: qty 30, 30d supply, fill #0

## 2024-03-11 ENCOUNTER — Encounter: Payer: Self-pay | Admitting: Radiology

## 2024-03-14 ENCOUNTER — Encounter: Payer: Self-pay | Admitting: Internal Medicine

## 2024-03-28 ENCOUNTER — Telehealth: Admitting: Family Medicine

## 2024-03-28 ENCOUNTER — Other Ambulatory Visit (HOSPITAL_BASED_OUTPATIENT_CLINIC_OR_DEPARTMENT_OTHER): Payer: Self-pay

## 2024-03-28 ENCOUNTER — Other Ambulatory Visit: Payer: Self-pay

## 2024-03-28 ENCOUNTER — Other Ambulatory Visit: Payer: Self-pay | Admitting: Internal Medicine

## 2024-03-28 DIAGNOSIS — H00014 Hordeolum externum left upper eyelid: Secondary | ICD-10-CM

## 2024-03-28 DIAGNOSIS — F419 Anxiety disorder, unspecified: Secondary | ICD-10-CM

## 2024-03-28 MED ORDER — ALPRAZOLAM 1 MG PO TABS
1.0000 mg | ORAL_TABLET | Freq: Two times a day (BID) | ORAL | 2 refills | Status: AC | PRN
  Filled 2024-03-28: qty 60, 30d supply, fill #0
  Filled 2024-05-06: qty 60, 30d supply, fill #1

## 2024-03-29 ENCOUNTER — Other Ambulatory Visit (HOSPITAL_BASED_OUTPATIENT_CLINIC_OR_DEPARTMENT_OTHER): Payer: Self-pay

## 2024-03-29 MED ORDER — ERYTHROMYCIN 5 MG/GM OP OINT
1.0000 | TOPICAL_OINTMENT | Freq: Every day | OPHTHALMIC | 0 refills | Status: AC
  Filled 2024-03-29: qty 3.5, 7d supply, fill #0

## 2024-03-29 NOTE — Progress Notes (Signed)
 We are sorry that you are not feeling well. Here is how we plan to help!  Based on what you have shared with me it looks like you have a stye.  A stye is an inflammation of the eyelid.  It is often a red, painful lump near the edge of the eyelid that may look like a boil or a pimple.  A stye develops when an infection occurs at the base of an eyelash.   We have made appropriate suggestions for you based upon your presentation: Simple styes can be treated without medical intervention.  Most styes either resolve spontaneously or resolve with simple home treatment by applying warm compresses or heated washcloth to the stye for about 10-15 minutes three to four times a day. This causes the stye to drain and resolve. and I have prescribed Erythromycin  Ophthalmic ointment 0.5% Apply topically in affected eye daily at bedtime for 5 days. To apply: Tilt the head back and, pressing your finger gently on the skin just beneath the lower eyelid, pull the lower eyelid away from the eye to make a space. Squeeze a thin strip of ointment into this space. A 1-cm (approximately 1/3-inch) strip of ointment is usually enough, unless you have been told by your doctor to use a different amount. Let go of the eyelid and gently close the eyes. Keep the eyes closed for 1 or 2 minutes to allow the medicine to come into contact with the infection.      HOME CARE:  Wash your hands often! Let the stye open on its own. Don't squeeze or open it. Don't rub your eyes. This can irritate your eyes and let in bacteria.  If you need to touch your eyes, wash your hands first. Don't wear eye makeup or contact lenses until the area has healed.  GET HELP RIGHT AWAY IF:  Your symptoms do not improve. You develop blurred or loss of vision. Your symptoms worsen (increased discharge, pain or redness).  Thank you for choosing an e-visit.  Your e-visit answers were reviewed by a board certified advanced clinical practitioner to complete  your personal care plan.  Depending upon the condition, your plan could have included both over the counter or prescription medications.  Please review your pharmacy choice.  Make sure the pharmacy is open so you can pick up prescription now.  If there is a problem, you may contact your provider through [employer] and have the prescription routed to another pharmacy.    Your safety is important to us .  If you have drug allergies check your prescription carefully.  For the next 24 hours you can use MyChart to ask questions about today's visit, request a non-urgent call back, or ask for a work or school excuse.  You will get an email in the next two days asking about your experience.  I hope you that your e-visit has been valuable and will speed your recovery.  I have spent 5 minutes in review of e-visit questionnaire, review and updating patient chart, medical decision making and response to patient.   Tami Barren, FNP

## 2024-04-11 ENCOUNTER — Other Ambulatory Visit: Payer: Self-pay | Admitting: Internal Medicine

## 2024-04-11 ENCOUNTER — Other Ambulatory Visit: Payer: Self-pay

## 2024-04-11 ENCOUNTER — Encounter: Payer: Self-pay | Admitting: Oncology

## 2024-04-11 ENCOUNTER — Other Ambulatory Visit (HOSPITAL_BASED_OUTPATIENT_CLINIC_OR_DEPARTMENT_OTHER): Payer: Self-pay

## 2024-04-11 MED ORDER — AMPHETAMINE-DEXTROAMPHET ER 20 MG PO CP24
20.0000 mg | ORAL_CAPSULE | Freq: Every day | ORAL | 0 refills | Status: DC
  Filled 2024-04-11: qty 30, 30d supply, fill #0

## 2024-04-17 ENCOUNTER — Other Ambulatory Visit (HOSPITAL_BASED_OUTPATIENT_CLINIC_OR_DEPARTMENT_OTHER): Payer: Self-pay

## 2024-05-03 ENCOUNTER — Encounter: Payer: Self-pay | Admitting: Internal Medicine

## 2024-05-06 ENCOUNTER — Other Ambulatory Visit (HOSPITAL_BASED_OUTPATIENT_CLINIC_OR_DEPARTMENT_OTHER): Payer: Self-pay

## 2024-05-06 ENCOUNTER — Other Ambulatory Visit: Payer: Self-pay

## 2024-05-06 MED ORDER — WEGOVY 0.25 MG/0.5ML ~~LOC~~ SOAJ
0.2500 mg | SUBCUTANEOUS | 11 refills | Status: DC
  Filled 2024-05-06 – 2024-05-07 (×2): qty 2, 28d supply, fill #0

## 2024-05-07 ENCOUNTER — Encounter: Payer: Self-pay | Admitting: Oncology

## 2024-05-07 ENCOUNTER — Other Ambulatory Visit (HOSPITAL_BASED_OUTPATIENT_CLINIC_OR_DEPARTMENT_OTHER): Payer: Self-pay

## 2024-05-07 ENCOUNTER — Other Ambulatory Visit: Payer: Self-pay

## 2024-05-07 ENCOUNTER — Other Ambulatory Visit (HOSPITAL_COMMUNITY): Payer: Self-pay

## 2024-05-25 ENCOUNTER — Other Ambulatory Visit: Payer: Self-pay | Admitting: Internal Medicine

## 2024-05-27 ENCOUNTER — Other Ambulatory Visit: Payer: Self-pay

## 2024-05-27 ENCOUNTER — Other Ambulatory Visit (HOSPITAL_BASED_OUTPATIENT_CLINIC_OR_DEPARTMENT_OTHER): Payer: Self-pay

## 2024-05-27 ENCOUNTER — Encounter: Payer: Self-pay | Admitting: Oncology

## 2024-05-27 ENCOUNTER — Other Ambulatory Visit: Payer: Self-pay | Admitting: Internal Medicine

## 2024-05-27 MED ORDER — TRAZODONE HCL 50 MG PO TABS
25.0000 mg | ORAL_TABLET | Freq: Every evening | ORAL | 0 refills | Status: AC | PRN
  Filled 2024-05-27: qty 90, 90d supply, fill #0

## 2024-05-27 MED ORDER — AMPHETAMINE-DEXTROAMPHET ER 20 MG PO CP24
20.0000 mg | ORAL_CAPSULE | Freq: Every day | ORAL | 0 refills | Status: AC
  Filled 2024-05-27: qty 30, 30d supply, fill #0

## 2024-06-05 ENCOUNTER — Other Ambulatory Visit (HOSPITAL_BASED_OUTPATIENT_CLINIC_OR_DEPARTMENT_OTHER): Payer: Self-pay

## 2024-06-05 MED ORDER — WEGOVY 0.5 MG/0.5ML ~~LOC~~ SOAJ
0.5000 mg | SUBCUTANEOUS | 11 refills | Status: AC
  Filled 2024-06-05: qty 2, 28d supply, fill #0

## 2024-06-05 NOTE — Addendum Note (Signed)
 Addended by: NORLEEN LYNWOOD ORN on: 06/05/2024 01:12 PM   Modules accepted: Orders
